# Patient Record
Sex: Male | Born: 1935 | Race: Black or African American | Hispanic: No | Marital: Single | Smoking: Former smoker
Health system: Southern US, Community
[De-identification: ages and names within clinical notes are randomized; demographics above are authoritative.]

## PROBLEM LIST (undated history)

## (undated) DIAGNOSIS — F0280 Dementia in other diseases classified elsewhere without behavioral disturbance: Secondary | ICD-10-CM

## (undated) DIAGNOSIS — H3530 Unspecified macular degeneration: Secondary | ICD-10-CM

## (undated) DIAGNOSIS — J690 Pneumonitis due to inhalation of food and vomit: Secondary | ICD-10-CM

## (undated) DIAGNOSIS — N3281 Overactive bladder: Secondary | ICD-10-CM

## (undated) DIAGNOSIS — J189 Pneumonia, unspecified organism: Secondary | ICD-10-CM

## (undated) DIAGNOSIS — B965 Pseudomonas (aeruginosa) (mallei) (pseudomallei) as the cause of diseases classified elsewhere: Secondary | ICD-10-CM

## (undated) DIAGNOSIS — M810 Age-related osteoporosis without current pathological fracture: Secondary | ICD-10-CM

## (undated) DIAGNOSIS — R55 Syncope and collapse: Secondary | ICD-10-CM

## (undated) DIAGNOSIS — N390 Urinary tract infection, site not specified: Secondary | ICD-10-CM

## (undated) DIAGNOSIS — F32A Depression, unspecified: Secondary | ICD-10-CM

## (undated) DIAGNOSIS — K589 Irritable bowel syndrome without diarrhea: Secondary | ICD-10-CM

## (undated) DIAGNOSIS — G20A1 Parkinson's disease without dyskinesia, without mention of fluctuations: Secondary | ICD-10-CM

## (undated) DIAGNOSIS — K122 Cellulitis and abscess of mouth: Secondary | ICD-10-CM

## (undated) DIAGNOSIS — G231 Progressive supranuclear ophthalmoplegia [Steele-Richardson-Olszewski]: Secondary | ICD-10-CM

## (undated) DIAGNOSIS — D649 Anemia, unspecified: Secondary | ICD-10-CM

## (undated) DIAGNOSIS — G20 Parkinson's disease: Secondary | ICD-10-CM

## (undated) DIAGNOSIS — I639 Cerebral infarction, unspecified: Secondary | ICD-10-CM

## (undated) DIAGNOSIS — G20C Parkinsonism, unspecified: Secondary | ICD-10-CM

## (undated) DIAGNOSIS — K5790 Diverticulosis of intestine, part unspecified, without perforation or abscess without bleeding: Secondary | ICD-10-CM

## (undated) DIAGNOSIS — E039 Hypothyroidism, unspecified: Secondary | ICD-10-CM

## (undated) DIAGNOSIS — G309 Alzheimer's disease, unspecified: Secondary | ICD-10-CM

## (undated) DIAGNOSIS — I10 Essential (primary) hypertension: Secondary | ICD-10-CM

## (undated) DIAGNOSIS — R32 Unspecified urinary incontinence: Secondary | ICD-10-CM

## (undated) DIAGNOSIS — M1990 Unspecified osteoarthritis, unspecified site: Secondary | ICD-10-CM

## (undated) DIAGNOSIS — M109 Gout, unspecified: Secondary | ICD-10-CM

## (undated) DIAGNOSIS — F329 Major depressive disorder, single episode, unspecified: Secondary | ICD-10-CM

## (undated) DIAGNOSIS — G459 Transient cerebral ischemic attack, unspecified: Secondary | ICD-10-CM

## (undated) DIAGNOSIS — IMO0002 Reserved for concepts with insufficient information to code with codable children: Secondary | ICD-10-CM

## (undated) HISTORY — DX: Pneumonia, unspecified organism: J18.9

## (undated) HISTORY — PX: ESOPHAGOGASTRODUODENOSCOPY: SHX1529

## (undated) HISTORY — DX: Urinary tract infection, site not specified: N39.0

## (undated) HISTORY — DX: Major depressive disorder, single episode, unspecified: F32.9

## (undated) HISTORY — DX: Unspecified osteoarthritis, unspecified site: M19.90

## (undated) HISTORY — DX: Gout, unspecified: M10.9

## (undated) HISTORY — DX: Pneumonitis due to inhalation of food and vomit: J69.0

## (undated) HISTORY — DX: Transient cerebral ischemic attack, unspecified: G45.9

## (undated) HISTORY — DX: Age-related osteoporosis without current pathological fracture: M81.0

## (undated) HISTORY — DX: Anemia, unspecified: D64.9

## (undated) HISTORY — DX: Irritable bowel syndrome, unspecified: K58.9

## (undated) HISTORY — DX: Overactive bladder: N32.81

## (undated) HISTORY — DX: Hypothyroidism, unspecified: E03.9

## (undated) HISTORY — DX: Unspecified urinary incontinence: R32

## (undated) HISTORY — DX: Essential (primary) hypertension: I10

## (undated) HISTORY — DX: Parkinson's disease: G20

## (undated) HISTORY — DX: Unspecified macular degeneration: H35.30

## (undated) HISTORY — DX: Dementia in other diseases classified elsewhere, unspecified severity, without behavioral disturbance, psychotic disturbance, mood disturbance, and anxiety: F02.80

## (undated) HISTORY — DX: Pseudomonas (aeruginosa) (mallei) (pseudomallei) as the cause of diseases classified elsewhere: B96.5

## (undated) HISTORY — DX: Reserved for concepts with insufficient information to code with codable children: IMO0002

## (undated) HISTORY — DX: Depression, unspecified: F32.A

## (undated) HISTORY — DX: Diverticulosis of intestine, part unspecified, without perforation or abscess without bleeding: K57.90

## (undated) HISTORY — DX: Parkinson's disease without dyskinesia, without mention of fluctuations: G20.A1

## (undated) HISTORY — DX: Alzheimer's disease, unspecified: G30.9

## (undated) HISTORY — DX: Cellulitis and abscess of mouth: K12.2

---

## 1998-07-13 ENCOUNTER — Encounter: Payer: Self-pay | Admitting: Cardiology

## 1998-07-13 ENCOUNTER — Ambulatory Visit (HOSPITAL_COMMUNITY): Admission: RE | Admit: 1998-07-13 | Discharge: 1998-07-13 | Payer: Self-pay | Admitting: Cardiology

## 2004-07-19 ENCOUNTER — Encounter: Admission: RE | Admit: 2004-07-19 | Discharge: 2004-07-19 | Payer: Self-pay | Admitting: Gastroenterology

## 2004-11-17 HISTORY — PX: EYE SURGERY: SHX253

## 2004-12-06 ENCOUNTER — Emergency Department (HOSPITAL_COMMUNITY): Admission: EM | Admit: 2004-12-06 | Discharge: 2004-12-06 | Payer: Self-pay | Admitting: Family Medicine

## 2005-06-17 ENCOUNTER — Encounter: Admission: RE | Admit: 2005-06-17 | Discharge: 2005-06-17 | Payer: Self-pay | Admitting: Family Medicine

## 2005-10-06 ENCOUNTER — Encounter: Admission: RE | Admit: 2005-10-06 | Discharge: 2005-10-06 | Payer: Self-pay | Admitting: Family Medicine

## 2006-03-18 ENCOUNTER — Encounter: Payer: Self-pay | Admitting: Cardiology

## 2006-05-07 ENCOUNTER — Ambulatory Visit: Payer: Self-pay | Admitting: Family Medicine

## 2006-07-07 ENCOUNTER — Ambulatory Visit: Payer: Self-pay | Admitting: Family Medicine

## 2006-07-10 ENCOUNTER — Encounter: Payer: Self-pay | Admitting: Nurse Practitioner

## 2006-07-10 ENCOUNTER — Ambulatory Visit: Payer: Self-pay | Admitting: Family Medicine

## 2006-07-15 ENCOUNTER — Ambulatory Visit: Payer: Self-pay | Admitting: Gastroenterology

## 2006-10-05 ENCOUNTER — Emergency Department (HOSPITAL_COMMUNITY): Admission: EM | Admit: 2006-10-05 | Discharge: 2006-10-05 | Payer: Self-pay | Admitting: Family Medicine

## 2007-09-16 ENCOUNTER — Ambulatory Visit: Payer: Self-pay | Admitting: Family Medicine

## 2007-10-04 ENCOUNTER — Encounter: Admission: RE | Admit: 2007-10-04 | Discharge: 2007-10-04 | Payer: Self-pay | Admitting: Family Medicine

## 2007-10-04 ENCOUNTER — Ambulatory Visit: Payer: Self-pay | Admitting: Family Medicine

## 2007-10-11 ENCOUNTER — Ambulatory Visit: Payer: Self-pay | Admitting: Family Medicine

## 2008-01-20 ENCOUNTER — Ambulatory Visit: Payer: Self-pay | Admitting: Family Medicine

## 2008-01-21 ENCOUNTER — Ambulatory Visit: Payer: Self-pay | Admitting: Family Medicine

## 2008-03-02 ENCOUNTER — Ambulatory Visit: Payer: Self-pay | Admitting: Family Medicine

## 2008-03-07 ENCOUNTER — Ambulatory Visit: Payer: Self-pay | Admitting: Family Medicine

## 2008-05-03 LAB — HM DEXA SCAN

## 2008-05-31 ENCOUNTER — Ambulatory Visit: Payer: Self-pay | Admitting: Family Medicine

## 2008-06-08 ENCOUNTER — Ambulatory Visit: Payer: Self-pay | Admitting: Family Medicine

## 2008-08-03 ENCOUNTER — Ambulatory Visit: Payer: Self-pay | Admitting: Family Medicine

## 2008-12-08 ENCOUNTER — Ambulatory Visit: Payer: Self-pay | Admitting: Family Medicine

## 2009-01-11 ENCOUNTER — Ambulatory Visit: Payer: Self-pay | Admitting: Family Medicine

## 2009-01-16 ENCOUNTER — Inpatient Hospital Stay (HOSPITAL_COMMUNITY): Admission: EM | Admit: 2009-01-16 | Discharge: 2009-01-24 | Payer: Self-pay | Admitting: Emergency Medicine

## 2009-01-16 ENCOUNTER — Ambulatory Visit: Payer: Self-pay | Admitting: Internal Medicine

## 2009-01-17 ENCOUNTER — Encounter (INDEPENDENT_AMBULATORY_CARE_PROVIDER_SITE_OTHER): Payer: Self-pay | Admitting: *Deleted

## 2009-03-12 ENCOUNTER — Ambulatory Visit: Payer: Self-pay | Admitting: Pulmonary Disease

## 2009-03-12 ENCOUNTER — Inpatient Hospital Stay (HOSPITAL_COMMUNITY): Admission: EM | Admit: 2009-03-12 | Discharge: 2009-04-02 | Payer: Self-pay | Admitting: Emergency Medicine

## 2009-03-19 ENCOUNTER — Ambulatory Visit: Payer: Self-pay | Admitting: Infectious Diseases

## 2009-03-19 ENCOUNTER — Encounter (INDEPENDENT_AMBULATORY_CARE_PROVIDER_SITE_OTHER): Payer: Self-pay | Admitting: Internal Medicine

## 2009-03-21 ENCOUNTER — Encounter (INDEPENDENT_AMBULATORY_CARE_PROVIDER_SITE_OTHER): Payer: Self-pay | Admitting: Internal Medicine

## 2009-03-21 ENCOUNTER — Ambulatory Visit: Payer: Self-pay | Admitting: Vascular Surgery

## 2009-04-17 ENCOUNTER — Encounter: Admission: RE | Admit: 2009-04-17 | Discharge: 2009-04-17 | Payer: Self-pay | Admitting: Geriatric Medicine

## 2009-05-31 ENCOUNTER — Ambulatory Visit: Payer: Self-pay | Admitting: Family Medicine

## 2009-06-01 ENCOUNTER — Telehealth (INDEPENDENT_AMBULATORY_CARE_PROVIDER_SITE_OTHER): Payer: Self-pay | Admitting: *Deleted

## 2009-06-14 ENCOUNTER — Ambulatory Visit: Payer: Self-pay | Admitting: Family Medicine

## 2009-07-12 ENCOUNTER — Ambulatory Visit: Payer: Self-pay | Admitting: Family Medicine

## 2009-08-20 ENCOUNTER — Ambulatory Visit: Payer: Self-pay | Admitting: Family Medicine

## 2009-09-06 ENCOUNTER — Ambulatory Visit: Payer: Self-pay | Admitting: Vascular Surgery

## 2009-09-06 ENCOUNTER — Inpatient Hospital Stay (HOSPITAL_COMMUNITY): Admission: EM | Admit: 2009-09-06 | Discharge: 2009-09-14 | Payer: Self-pay | Admitting: Emergency Medicine

## 2009-09-06 ENCOUNTER — Ambulatory Visit: Payer: Self-pay | Admitting: Internal Medicine

## 2009-09-07 ENCOUNTER — Encounter (INDEPENDENT_AMBULATORY_CARE_PROVIDER_SITE_OTHER): Payer: Self-pay | Admitting: Family Medicine

## 2009-09-24 DIAGNOSIS — F0280 Dementia in other diseases classified elsewhere without behavioral disturbance: Secondary | ICD-10-CM

## 2009-09-24 DIAGNOSIS — R1310 Dysphagia, unspecified: Secondary | ICD-10-CM | POA: Insufficient documentation

## 2009-09-24 DIAGNOSIS — E039 Hypothyroidism, unspecified: Secondary | ICD-10-CM | POA: Insufficient documentation

## 2009-09-24 DIAGNOSIS — Z8669 Personal history of other diseases of the nervous system and sense organs: Secondary | ICD-10-CM

## 2009-09-24 DIAGNOSIS — Z86718 Personal history of other venous thrombosis and embolism: Secondary | ICD-10-CM

## 2009-09-24 DIAGNOSIS — M109 Gout, unspecified: Secondary | ICD-10-CM

## 2009-09-24 DIAGNOSIS — K3184 Gastroparesis: Secondary | ICD-10-CM

## 2009-09-24 DIAGNOSIS — G309 Alzheimer's disease, unspecified: Secondary | ICD-10-CM

## 2009-09-24 DIAGNOSIS — I498 Other specified cardiac arrhythmias: Secondary | ICD-10-CM | POA: Insufficient documentation

## 2009-10-16 ENCOUNTER — Ambulatory Visit: Payer: Self-pay | Admitting: Family Medicine

## 2009-10-19 ENCOUNTER — Ambulatory Visit: Payer: Self-pay | Admitting: Family Medicine

## 2009-11-13 ENCOUNTER — Ambulatory Visit: Payer: Self-pay | Admitting: Family Medicine

## 2009-11-21 ENCOUNTER — Ambulatory Visit: Payer: Self-pay | Admitting: Family Medicine

## 2009-12-17 ENCOUNTER — Inpatient Hospital Stay (HOSPITAL_COMMUNITY): Admission: EM | Admit: 2009-12-17 | Discharge: 2009-12-20 | Payer: Self-pay | Admitting: Emergency Medicine

## 2009-12-17 ENCOUNTER — Ambulatory Visit: Payer: Self-pay | Admitting: Internal Medicine

## 2009-12-23 ENCOUNTER — Ambulatory Visit: Payer: Self-pay | Admitting: Vascular Surgery

## 2009-12-23 ENCOUNTER — Emergency Department (HOSPITAL_COMMUNITY): Admission: EM | Admit: 2009-12-23 | Discharge: 2009-12-23 | Payer: Self-pay | Admitting: Family Medicine

## 2009-12-23 ENCOUNTER — Emergency Department (HOSPITAL_COMMUNITY): Admission: EM | Admit: 2009-12-23 | Discharge: 2009-12-23 | Payer: Self-pay | Admitting: Emergency Medicine

## 2009-12-25 ENCOUNTER — Ambulatory Visit: Payer: Self-pay | Admitting: Cardiovascular Disease

## 2010-01-01 ENCOUNTER — Ambulatory Visit: Payer: Self-pay | Admitting: Family Medicine

## 2010-01-29 ENCOUNTER — Ambulatory Visit: Payer: Self-pay | Admitting: Family Medicine

## 2010-03-05 ENCOUNTER — Ambulatory Visit: Payer: Self-pay | Admitting: Family Medicine

## 2010-06-17 ENCOUNTER — Ambulatory Visit: Payer: Self-pay | Admitting: Internal Medicine

## 2010-06-26 ENCOUNTER — Ambulatory Visit: Payer: Self-pay | Admitting: Family Medicine

## 2010-06-27 ENCOUNTER — Ambulatory Visit: Payer: Self-pay | Admitting: Family Medicine

## 2010-07-24 ENCOUNTER — Ambulatory Visit: Payer: Self-pay | Admitting: Family Medicine

## 2010-08-01 ENCOUNTER — Encounter: Payer: Self-pay | Admitting: Internal Medicine

## 2010-09-06 ENCOUNTER — Telehealth: Payer: Self-pay | Admitting: Internal Medicine

## 2010-09-06 ENCOUNTER — Emergency Department (HOSPITAL_COMMUNITY): Admission: EM | Admit: 2010-09-06 | Discharge: 2010-09-06 | Payer: Self-pay | Admitting: Emergency Medicine

## 2010-10-21 ENCOUNTER — Ambulatory Visit: Payer: Self-pay | Admitting: Family Medicine

## 2010-10-22 ENCOUNTER — Telehealth: Payer: Self-pay | Admitting: Internal Medicine

## 2010-10-22 ENCOUNTER — Emergency Department (HOSPITAL_COMMUNITY)
Admission: EM | Admit: 2010-10-22 | Discharge: 2010-10-22 | Payer: Self-pay | Source: Home / Self Care | Admitting: Emergency Medicine

## 2010-10-24 ENCOUNTER — Ambulatory Visit: Payer: Self-pay | Admitting: Internal Medicine

## 2010-10-24 ENCOUNTER — Encounter: Payer: Self-pay | Admitting: Internal Medicine

## 2010-10-28 ENCOUNTER — Telehealth: Payer: Self-pay | Admitting: Internal Medicine

## 2010-11-25 ENCOUNTER — Ambulatory Visit: Admit: 2010-11-25 | Payer: Self-pay | Admitting: Internal Medicine

## 2010-11-26 ENCOUNTER — Encounter (INDEPENDENT_AMBULATORY_CARE_PROVIDER_SITE_OTHER): Payer: Self-pay | Admitting: *Deleted

## 2010-11-28 ENCOUNTER — Telehealth: Payer: Self-pay | Admitting: Internal Medicine

## 2010-11-28 ENCOUNTER — Ambulatory Visit
Admission: RE | Admit: 2010-11-28 | Discharge: 2010-11-28 | Payer: Self-pay | Source: Home / Self Care | Attending: Family Medicine | Admitting: Family Medicine

## 2010-12-17 NOTE — Progress Notes (Signed)
Summary: pt on the way to [facility] 4th syncope  Phone Note Call from Patient Call back at [phone]   Caller: Son/REGINALD Reason for Call: Talk to Nurse Summary of Call: pt son reginald states father is on his way to [facility]. pt son reginald states father had his fourth syncope. Initial call taken by: Roe Coombs,  September 06, 2010 10:12 AM  Follow-up for Phone Call        I spoke with Tiburcio Bash about his fatherwho this morning became unconscious while sitting in the chair.  He estimates he was unconscious for about 10 minutes.  According to son, patient was breathing normally, drooling, and did not experience any tremors. He was not able to awaken him until 911 instructed him to place his father in the prone position on the floor and turn head to side.  Pt then began to regain consciousness according to son. Son just called back to say pts heartrate was 48.  Trish aware pt on the way to ED.  I will send to Dr. Waldon Merl RN

## 2010-12-17 NOTE — Letter (Signed)
Summary: [facility] Office Visit Note   [facility] Office Visit Note   Imported By: Roderic Ovens 08/16/2010 11:49:25  _____________________________________________________________________  External Attachment:    Type:   Image     Comment:   External Document

## 2010-12-17 NOTE — Progress Notes (Signed)
Summary: pt's son calling re pt in er  Phone Note Call from Patient   Caller: Ricky [name] pt's son [phone] Reason for Call: Talk to Nurse Summary of Call: re pt at [facility] long er-pls call  Initial call taken by: Glynda Jaeger,  October 22, 2010 4:01 PM  Follow-up for Phone Call        son called to say that in the ER the pt's heart rate seems to be in consistently in the 40's-son questioned if this was the cause of pt's syncope--per son--pt still in ER and has had lots of testing -pt on his way for CT scan-he wanted Dr Tenny Craw to be aware of this-I will forward to Dr Tenny Craw for review     Appended Document: pt's son calling re pt in er Called patient's son and advised him to bring Ricky [name] in to see Dr.Ross tomorrow 12/8  at 930 am.   Appended Document: pt's son calling re pt in er Patient seen in clnic

## 2010-12-17 NOTE — Assessment & Plan Note (Signed)
Summary: eph/jss   History of Present Illness: Ricky [name] is a patient of Dr Tenny Craw recenlty seen at [facility].  He has had a progressive neurologic decline over the last year.  He has been somewhat bradycardic.  In January during his hospitalization he had his aricept stopped.  He has had no evidence of heart block or symptomatic bradycardia.  He has had 3 "syncopal" episodes over the last 6 months.  They have prodromes of staring with stiffness and Upper extremity shaking.  He has been evaluated by neurology but not placed on any meds.  I suggested to his son that he seek a second opinion since he has had such a percipitus change in functional status with presumed dementia.  From a cardiac standpoint there have been no documented arrythmia, heart block.  His echo shows normal LV funciton.  He is no longer on any AV nodal blocking drugs.  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 Two Times A Day 2)  Hydralazine Hcl 10 Mg Tabs (Hydralazine Hcl) .... Take One Tablet By Mouth Three Times A Day 3)  Avelox 400 Mg Tabs (Moxifloxacin Hcl) .Marland Kitchen.. 1 Tab By Mouth Once Daily 4)  Alendronate Sodium 70 Mg Tabs (Alendronate Sodium) .Marland Kitchen.. 1 Tab Weekly 5)  Lisinopril 20 Mg Tabs (Lisinopril) .... Take One Tablet By Mouth Daily 6)  Namenda 10 Mg Tabs (Memantine Hcl) .Marland Kitchen.. 1 Tab By Mouth Once Daily 7)  Omeprazole 20 Mg Cpdr (Omeprazole) .Marland Kitchen.. 1 Tab By Mouth Once Daily 8)  Synthroid 25 Mcg Tabs (Levothyroxine Sodium) .Marland Kitchen.. 1 Tab By  Mouth Once Daily 9)  Warfarin Sodium 7.5 Mg Tabs (Warfarin Sodium) .... As Directed 10)  Warfarin Sodium 2.5 Mg Tabs (Warfarin Sodium) .... As Direcetd  Allergies (verified): No Known Drug Allergies  Past History:  Past Medical History: Last updated: 09/24/2009 Current Problems:  HYPERTENSION, UNSPECIFIED (ICD-401.9) SYNCOPE, HX OF (ICD-V12.49) BRADYCARDIA (ICD-427.89) ALZHEIMERS DISEASE (ICD-331.0) ACCIDENTAL FALLS, RECURRENT (ICD-E888.9) GASTROPARESIS (ICD-536.3) GOUT, UNSPECIFIED (ICD-274.9) HYPOTHYROIDISM (ICD-244.9) PULMONARY EMBOLISM, HX OF (ICD-V12.51) DYSPHAGIA UNSPECIFIED (ICD-787.20)  Past Surgical History: Last updated: 09/24/2009 None  Family History: Last updated: 09/24/2009  There is no premature coronary disease in the family.   Social History: Last updated: 09/24/2009  He lives in the [facility].  He denies   current use of tobacco, alcohol or drugs.      Review of Systems       Denies fever, malais, weight loss, blurry vision, decreased visual acuity, cough, sputum, SOB, hemoptysis, pleuritic pain, palpitaitons, heartburn, abdominal pain, melena, lower extremity edema, claudication, or rash. All other systems reveiwed and negative  Vital Signs:  Patient profile:   77 year old male Height:      73 inches Weight:      162 pounds Pulse rate:   52 / minute Resp:     14 per minute BP sitting:   140 / 70  (left arm)  Vitals Entered By: Kem Parkinson (December 25, 2009 10:49 AM)  Physical Exam  General:  Affect appropriate Healthy:  appears stated age HEENT: normal Neck supple with no adenopathy JVP normal no bruits no thyromegaly Lungs clear with no wheezing and good diaphragmatic motion Heart:  S1/S2 no murmur,rub, gallop or click PMI normal Abdomen: benighn, BS positve, no tenderness, no AAA no bruit.  No HSM or HJR Distal pulses intact with no bruits No edema Neuro non-focal Skin warm and dry Stiff in extremities  with slow oral response   Impression & Recommendations:  Problem # 1:  HYPERTENSION, UNSPECIFIED (ICD-401.9) Well cotnrolled His updated medication list for this problem includes:    Hydralazine Hcl 10 Mg Tabs (Hydralazine hcl) .Marland Kitchen... Take one tablet by mouth three times a day    Lisinopril 20 Mg Tabs (Lisinopril) .Marland Kitchen... Take one tablet by mouth daily  Problem # 2:  BRADYCARDIA (ICD-427.89) STable with no heart block His updated medication list for this problem includes:    Lovenox 80 Mg/0.27ml Soln (Enoxaparin sodium) .Marland Kitchen..Marland Kitchen Two times a day    Lisinopril 20 Mg Tabs (Lisinopril) .Marland Kitchen... Take one tablet by mouth daily    Warfarin Sodium 7.5 Mg Tabs (Warfarin sodium) .Marland Kitchen... As directed    Warfarin Sodium 2.5 Mg Tabs (Warfarin sodium) .Marland Kitchen... As direcetd  Problem # 5:  SYNCOPE, HX OF (ICD-V12.49) ? seizurs or other undiagnosed neurologic issue aside from just dementia.  Recommend 2nd opionion from neurology at [facility] or [facility]  Problem # 6:  PULMONARY EMBOLISM, HX OF (ICD-V12.51) Residual clot seen on CT 1/11.  Given age and neuro status would consider placing an IVC filter and stopping coumadin after 3 months. or sooner as he is at risk of recurrent falls and bleed. His updated medication list for this problem includes:    Lovenox 80 Mg/0.79ml Soln (Enoxaparin sodium) .Marland Kitchen..Marland Kitchen Two times a day    Warfarin Sodium 7.5 Mg Tabs (Warfarin sodium) .Marland Kitchen... As directed    Warfarin Sodium 2.5 Mg Tabs (Warfarin sodium) .Marland Kitchen... As direcetd  Patient Instructions: 1)  Your physician recommends that you schedule a follow-up appointment in: 6 MONTHS

## 2010-12-17 NOTE — Progress Notes (Signed)
Summary: syncope   Phone Note Call from Patient Call back at [phone]   Caller: Son Reason for Call: Talk to Nurse Summary of Call: father had another syncopy on his way to [facility]. Initial call taken by: Roe Coombs,  October 22, 2010 12:08 PM  Follow-up for Phone Call        I called and spoke with the pt's son. He states his dad had his 5th syncopal episode about 30 minutes ago. He had gotten up and was getting ready. He was in the bathroom and sat down on the toilet. He became unresponsive for about 6 minutes, but did not hit his head. The pt's son reports his HR was slow, but he did not have any shaking or tremors like his first 3 episodes. The pt is just arriving at the [facility] ER via EMS while I am speaking with him. I instructed the pt's son I would let our group know he was at the ER, but the ER MD should evaluate him. He is agreeable. Follow-up by: Sherri Rad, RN, BSN,  October 22, 2010 12:13 PM  Additional Follow-up for Phone Call Additional follow up Details #1::        I spoke with Trish and made her aware the pt is in the ER at [facility].  Additional Follow-up by: Sherri Rad, RN, BSN,  October 22, 2010 1:05 PM

## 2010-12-17 NOTE — Assessment & Plan Note (Signed)
Summary: F6M/DM   Visit Type:  Follow-up Primary Provider:  Dr Debbe Mounts  CC:  no complaints- pt is being taken care of by his son.  History of Present Illness: Patient is a  77 year old who was  discharged from [facility] earlier this year.  He was admitted for syncope.  He was found to be bradycardic Aricept was discontinued.  He was not felt to be a candidate for pacemaker. Since d/c he has not  had any further syncopal spells.  He denies chest pains.  No signifiant dizziness His son is with him today.   Concern is the  rapid change in the patient's mental statu, slowing down.  It has not been fully evaluated and family would like to have it evaluated.  Current Medications (verified): 1)  Hydralazine Hcl 10 Mg Tabs (Hydralazine Hcl) .... Take One Tablet By Mouth Three Times A Day 2)  Avelox 400 Mg Tabs (Moxifloxacin Hcl) .Marland Kitchen.. 1 Tab By Mouth Once Daily 3)  Alendronate Sodium 70 Mg Tabs (Alendronate Sodium) .Marland Kitchen.. 1 Tab Weekly 4)  Lisinopril 20 Mg Tabs (Lisinopril) .... Take One Tablet By Mouth Daily 5)  Namenda 10 Mg Tabs (Memantine Hcl) .Marland Kitchen.. 1 Tab By Mouth Once Daily 6)  Omeprazole 20 Mg Cpdr (Omeprazole) .Marland Kitchen.. 1 Tab By Mouth Once Daily 7)  Synthroid 25 Mcg Tabs (Levothyroxine Sodium) .Marland Kitchen.. 1 Tab By Mouth Once Daily 8)  Warfarin Sodium 7.5 Mg Tabs (Warfarin Sodium) .... As Directed 9)  Warfarin Sodium 2.5 Mg Tabs (Warfarin Sodium) .... As Direcetd  Allergies (verified): No Known Drug Allergies  Past History:  Past medical, surgical, family and social histories (including risk factors) reviewed, and no changes noted (except as noted below).  Past Medical History: Reviewed history from 09/24/2009 and no changes required. Current Problems:  HYPERTENSION, UNSPECIFIED (ICD-401.9) SYNCOPE, HX OF (ICD-V12.49) BRADYCARDIA (ICD-427.89) ALZHEIMERS DISEASE (ICD-331.0) ACCIDENTAL FALLS, RECURRENT (ICD-E888.9) GASTROPARESIS (ICD-536.3) GOUT, UNSPECIFIED (ICD-274.9) HYPOTHYROIDISM  (ICD-244.9) PULMONARY EMBOLISM, HX OF (ICD-V12.51) DYSPHAGIA UNSPECIFIED (ICD-787.20)  Past Surgical History: Reviewed history from 09/24/2009 and no changes required. None  Family History: Reviewed history from 09/24/2009 and no changes required.  There is no premature coronary disease in the family.   Social History: Reviewed history from 09/24/2009 and no changes required.  He lives in the [facility].  He denies   current use of tobacco, alcohol or drugs.      Review of Systems       All systems reviewed.  Negative to the above prblem.  Vital Signs:  Patient profile:   77 year old male Height:      73 inches Weight:      179 pounds BMI:     23.70 Pulse rate:   60 / minute BP sitting:   150 / 80  (left arm) Cuff size:   large  Vitals Entered By: Burnett Kanaris, CNA (June 17, 2010 2:02 PM)  Physical Exam  Additional Exam:  Patient is in NAD HEENT:  Normocephalic, atraumatic. EOMI, PERRLA.  Neck: JVP is normal. No thyromegaly. No bruits.  Lungs: clear to auscultation. No rales no wheezes.  Heart: Regular rate and rhythm. Normal S1, S2. No S3.   No significant murmurs. PMI not displaced.  Abdomen:  Supple, nontender. Normal bowel sounds. No masses. No hepatomegaly.  Extremities:   Good distal pulses throughout. No lower extremity edema.  Musculoskeletal :moving all extremities.  Neuro:   alert and oriented x3.    Impression & Recommendations:  Problem # 1:  SYNCOPE, HX OF (ICD-V12.49) No recurrence.  Follow.  No evidence for pacer indication. Will refer to [facility] neuro for evaluation of possible dementia (reported recent onset)  Problem # 2:  HYPERTENSION, UNSPECIFIED (ICD-401.9) Al little high.  LAst visit was 140/70  Follow. His updated medication list for this problem includes:    Hydralazine Hcl 10 Mg Tabs (Hydralazine hcl) .Marland Kitchen... Take one tablet by mouth three times a day    Lisinopril 20 Mg Tabs (Lisinopril) .Marland Kitchen... Take one tablet by  mouth daily  Other Orders: Neurology Referral (Neuro)  Patient Instructions: 1)  Your physician wants you to follow-up in:6 months   You will receive a reminder letter in the mail two months in advance. If you don't receive a letter, please call our office to schedule the follow-up appointment. 2)  You have been referred to [facility] Neuro....will call you with appointment with Dr,Love.

## 2010-12-17 NOTE — Assessment & Plan Note (Signed)
Summary: ? syncope   Primary Provider:  Dr Debbe Mounts  CC:  C/O syncope.  History of Present Illness: patient is a 77 year old with a history of syncope and bradycardia..   I saw him in August.  He was admitted in the spring.  Aricept was stopped because of bradycardia.  No signifi  bradycardia was noted after Since I saw him he has had a couple of syncopal spells.  One occurred while he was watching TV.  lasted a few minutes.  No prodorome. The other spell happened this week.  He was in bathroom  He had finished getting dressed.  No dizziness.  Just sitting on toilet, not going to the bathroom.  Became unresponsive, eyes rolling back in head.  Again, no dizziness pror or after.  Seen in ER.  D/C'd.   SInce then is feeling fine.  Current Medications (verified): 1)  Hydralazine Hcl 10 Mg Tabs (Hydralazine Hcl) .... Take One Tablet By Mouth Three Times A Day 2)  Avelox 400 Mg Tabs (Moxifloxacin Hcl) .Marland Kitchen.. 1 Tab By Mouth Once Daily 3)  Alendronate Sodium 70 Mg Tabs (Alendronate Sodium) .Marland Kitchen.. 1 Tab Weekly 4)  Lisinopril 20 Mg Tabs (Lisinopril) .... Take One Tablet By Mouth Daily 5)  Namenda 10 Mg Tabs (Memantine Hcl) .Marland Kitchen.. 1 Tab By Mouth Once Daily 6)  Omeprazole 20 Mg Cpdr (Omeprazole) .Marland Kitchen.. 1 Tab By Mouth Once Daily 7)  Synthroid 25 Mcg Tabs (Levothyroxine Sodium) .Marland Kitchen.. 1 Tab By Mouth Once Daily 8)  Warfarin Sodium 7.5 Mg Tabs (Warfarin Sodium) .... As Directed 9)  Warfarin Sodium 2.5 Mg Tabs (Warfarin Sodium) .... As Direcetd  Allergies: No Known Drug Allergies  Past History:  Past medical, surgical, family and social histories (including risk factors) reviewed, and no changes noted (except as noted below).  Past Medical History: Reviewed history from 09/24/2009 and no changes required. Current Problems:  HYPERTENSION, UNSPECIFIED (ICD-401.9) SYNCOPE, HX OF (ICD-V12.49) BRADYCARDIA (ICD-427.89) ALZHEIMERS DISEASE (ICD-331.0) ACCIDENTAL FALLS, RECURRENT (ICD-E888.9) GASTROPARESIS  (ICD-536.3) GOUT, UNSPECIFIED (ICD-274.9) HYPOTHYROIDISM (ICD-244.9) PULMONARY EMBOLISM, HX OF (ICD-V12.51) DYSPHAGIA UNSPECIFIED (ICD-787.20)  Past Surgical History: Reviewed history from 09/24/2009 and no changes required. None  Family History: Reviewed history from 09/24/2009 and no changes required.  There is no premature coronary disease in the family.   Social History: Reviewed history from 09/24/2009 and no changes required.  He lives in the [facility].  He denies   current use of tobacco, alcohol or drugs.      Vital Signs:  Patient profile:   77 year old male Height:      73 inches Weight:      177 pounds BMI:     23.44 Pulse rate:   63 / minute BP supine:   142 / 74  (right arm) BP sitting:   136 / 73  (right arm) BP standing:   122 / 69  (right arm)  Physical Exam  Additional Exam:  Patient is in NAD HEENT:  Normocephalic, atraumatic. EOMI, PERRLA.  Neck: JVP is normal. No thyromegaly. No bruits.  Lungs: clear to auscultation. No rales no wheezes.  Heart: Regular rate and rhythm. Normal S1, S2. No S3.   No significant murmurs. PMI not displaced.  Abdomen:  Supple, nontender. Normal bowel sounds. No masses. No hepatomegaly.  Extremities:   Good distal pulses throughout. No lower extremity edema.  Musculoskeletal :moving all extremities.  Neuro:   alert and oriented x3.    EKG  Procedure date:  10/24/2010  Findings:      NSR.  63 bpm.  LVH.  Nonspecific ST T wave changes.  Impression & Recommendations:  Problem # 1:  SYNCOPE, HX OF (ICD-V12.49) Patient with two more spells.  NO prodrome. Will arrange for lifewatch monitor.  Continue activities as tolerated. Orders: Event (Event)  Problem # 2:  BRADYCARDIA (ICD-427.89) Monitor. His updated medication list for this problem includes:    Lisinopril 20 Mg Tabs (Lisinopril) .Marland Kitchen... Take one tablet by mouth daily    Warfarin Sodium 7.5 Mg Tabs (Warfarin sodium) .Marland Kitchen... As directed     Warfarin Sodium 2.5 Mg Tabs (Warfarin sodium) .Marland Kitchen... As direcetd  Problem # 3:  PULMONARY EMBOLISM, HX OF (ICD-V12.51) Continue coumadin.  CT does not show recurrence. His updated medication list for this problem includes:    Warfarin Sodium 7.5 Mg Tabs (Warfarin sodium) .Marland Kitchen... As directed    Warfarin Sodium 2.5 Mg Tabs (Warfarin sodium) .Marland Kitchen... As direcetd  Patient Instructions: 1)  Remote monitoring is used to monitor your Pacemaker or ICD from home. This monitoring reduces the number of office visits required to check your device to one time per year.  It allows [place] to keep an eye on the functioning of your device to ensure it is working properly. You are scheduled for a device check from home on     . You may send your transmission at any time that day. If you have a wireless device, the transmission will be sent automatically. After your physician reviews your transmission, you will receive a postcard with your next transmission date. 2)  Your physician recommends that you schedule a follow-up appointment in: we will phone you with results

## 2010-12-19 NOTE — Progress Notes (Signed)
Summary: pt's son has info   Phone Note Call from Patient   Caller: son [phone] reggie Reason for Call: Talk to Nurse Summary of Call: pt's son wants to provide some info he forgot to mention last visit Initial call taken by: Glynda Jaeger,  October 28, 2010 8:49 AM  Follow-up for Phone Call        10/28/10--9am--pt's son calling to inform dr Tenny Craw that when he wentto see dr Susann Givens last week they were unable to get enough blood for PT before vein collasped--he states his father was well hydrated at the time and is concerned about pt's fluid volume--advised i would inform dr ross--pt's son agrees Follow-up by: Ledon Snare, RN,  October 28, 2010 9:00 AM     Appended Document: pt's son has info  Make sure he gets adequate fluids during day, that he urinates thorughout day and that it is relatively dilute.  Appended Document: pt's son has info  Called patient's son with above information. He states that he feels that his father is drinking enough fluids based on his urine output in the urinal.

## 2010-12-19 NOTE — Progress Notes (Addendum)
Summary: rtn your call  Phone Note Call from Patient Call back at [phone]   Caller: Son Reason for Call: Talk to Nurse, Talk to Doctor Summary of Call: assumed that Tuesdays appt was an old appt prior to recent hospitlization Initial call taken by: Omer Jack,  November 28, 2010 5:00 PM  Follow-up for Phone Call        Monitor was negative for signif bradycardia. QUestion if he has passed out since he was in clinic  Follow-up by: Sherrill Raring, MD, [facility],  December 07, 2010 8:49 PM     Appended Document: rtn your call [facility] for [name] (pt's son) to check to see if he has had any more episodes.  Appended Document: rtn your call Called patient's son ( [name] ) and he states that his father has had no more syncopal episodes. He lives with his father now and his a home care assistant for a few hours every day. He states that there are times that he is alone but does not want to go to an assisted living. He lived in the [place] home last year and disliked it. [name] states that his sister is filing paperwork to have power of attorney. [name] will call us if he has any more syncopal episodes. Will let Dr.Mantaj Chamberlin know.

## 2010-12-19 NOTE — Letter (Signed)
Summary: Appointment - Missed  [facility], Main Office  [address]   [place], [place] [address]   Phone: [phone]  Fax: [phone]     November 26, 2010 MRN: [id number]   Ricky [name] [address] [place], [place]  [address]   Dear Ricky [name],  Our records indicate you missed your appointment on 11/25/10 with Dr. Tenny Craw .It is very important that we reach you to reschedule this appointment. We look forward to participating in your health care needs. Please contact us at the number listed above at your earliest convenience to reschedule this appointment.     Sincerely,  Artist

## 2011-01-10 ENCOUNTER — Institutional Professional Consult (permissible substitution) (INDEPENDENT_AMBULATORY_CARE_PROVIDER_SITE_OTHER): Payer: BC Managed Care – PPO | Admitting: Family Medicine

## 2011-01-10 DIAGNOSIS — Z7189 Other specified counseling: Secondary | ICD-10-CM

## 2011-01-28 ENCOUNTER — Ambulatory Visit: Payer: BC Managed Care – PPO | Admitting: Family Medicine

## 2011-01-28 LAB — PROTIME-INR: Prothrombin Time: 31.6 seconds — ABNORMAL HIGH (ref 11.6–15.2)

## 2011-01-28 LAB — POCT CARDIAC MARKERS
CKMB, poc: 1.1 ng/mL (ref 1.0–8.0)
Myoglobin, poc: 119 ng/mL (ref 12–200)
Myoglobin, poc: 80 ng/mL (ref 12–200)

## 2011-01-28 LAB — CBC
HCT: 35.5 % — ABNORMAL LOW (ref 39.0–52.0)
MCHC: 33.2 g/dL (ref 30.0–36.0)
Platelets: 151 10*3/uL (ref 150–400)
RDW: 14 % (ref 11.5–15.5)
WBC: 5.8 10*3/uL (ref 4.0–10.5)

## 2011-01-28 LAB — COMPREHENSIVE METABOLIC PANEL
ALT: 8 U/L (ref 0–53)
AST: 19 U/L (ref 0–37)
Albumin: 3.7 g/dL (ref 3.5–5.2)
Alkaline Phosphatase: 58 U/L (ref 39–117)
Calcium: 9.7 mg/dL (ref 8.4–10.5)
GFR calc Af Amer: >60 mL/min (ref >60)
Glucose, Bld: 89 mg/dL (ref 70–99)
Potassium: 4.4 mEq/L (ref 3.5–5.1)
Sodium: 141 mEq/L (ref 135–145)
Total Protein: 7.3 g/dL (ref 6.0–8.3)

## 2011-01-28 LAB — DIFFERENTIAL
Basophils Relative: 0 % (ref 0–1)
Eosinophils Absolute: 0.1 10*3/uL (ref 0.0–0.7)
Eosinophils Relative: 1 % (ref 0–5)
Lymphs Abs: 1.2 10*3/uL (ref 0.7–4.0)
Monocytes Absolute: 0.5 10*3/uL (ref 0.1–1.0)
Monocytes Relative: 8 % (ref 3–12)

## 2011-01-29 ENCOUNTER — Ambulatory Visit (INDEPENDENT_AMBULATORY_CARE_PROVIDER_SITE_OTHER): Payer: BC Managed Care – PPO | Admitting: Family Medicine

## 2011-01-29 DIAGNOSIS — S5000XA Contusion of unspecified elbow, initial encounter: Secondary | ICD-10-CM

## 2011-01-29 LAB — DIFFERENTIAL
Basophils Absolute: 0 10*3/uL (ref 0.0–0.1)
Basophils Relative: 0 % (ref 0–1)
Eosinophils Absolute: 0 10*3/uL (ref 0.0–0.7)
Neutro Abs: 3.7 10*3/uL (ref 1.7–7.7)
Neutrophils Relative %: 66 % (ref 43–77)

## 2011-01-29 LAB — URINALYSIS, ROUTINE W REFLEX MICROSCOPIC
Nitrite: NEGATIVE
Protein, ur: NEGATIVE mg/dL
Specific Gravity, Urine: 1.01 (ref 1.005–1.030)
Urobilinogen, UA: 0.2 mg/dL (ref 0.0–1.0)

## 2011-01-29 LAB — COMPREHENSIVE METABOLIC PANEL
ALT: 9 U/L (ref 0–53)
Alkaline Phosphatase: 71 U/L (ref 39–117)
BUN: 14 mg/dL (ref 6–23)
CO2: 27 mEq/L (ref 19–32)
GFR calc non Af Amer: >60 mL/min (ref >60)
Glucose, Bld: 96 mg/dL (ref 70–99)
Potassium: 3.9 mEq/L (ref 3.5–5.1)
Sodium: 141 mEq/L (ref 135–145)

## 2011-01-29 LAB — PROTIME-INR: INR: 3.92 — ABNORMAL HIGH (ref 0.00–1.49)

## 2011-01-29 LAB — CBC
HCT: 39.4 % (ref 39.0–52.0)
Hemoglobin: 12.4 g/dL — ABNORMAL LOW (ref 13.0–17.0)
MCHC: 31.5 g/dL (ref 30.0–36.0)
MCV: 87.2 fL (ref 78.0–100.0)
RDW: 14.1 % (ref 11.5–15.5)
WBC: 5.7 10*3/uL (ref 4.0–10.5)

## 2011-01-29 LAB — URINE CULTURE

## 2011-01-29 LAB — GLUCOSE, CAPILLARY: Glucose-Capillary: 92 mg/dL (ref 70–99)

## 2011-02-02 LAB — URINE CULTURE: Colony Count: NO GROWTH

## 2011-02-02 LAB — URINALYSIS, ROUTINE W REFLEX MICROSCOPIC
Ketones, ur: NEGATIVE mg/dL
Nitrite: NEGATIVE
Specific Gravity, Urine: 1.016 (ref 1.005–1.030)
pH: 6.5 (ref 5.0–8.0)

## 2011-02-02 LAB — DIFFERENTIAL
Basophils Relative: 0 % (ref 0–1)
Eosinophils Absolute: 0 10*3/uL (ref 0.0–0.7)
Monocytes Absolute: 0.5 10*3/uL (ref 0.1–1.0)
Monocytes Relative: 8 % (ref 3–12)
Neutrophils Relative %: 79 % — ABNORMAL HIGH (ref 43–77)

## 2011-02-02 LAB — POCT I-STAT, CHEM 8
Calcium, Ion: 1.17 mmol/L (ref 1.12–1.32)
Glucose, Bld: 88 mg/dL (ref 70–99)
HCT: 40 % (ref 39.0–52.0)
Hemoglobin: 13.6 g/dL (ref 13.0–17.0)

## 2011-02-02 LAB — CBC
MCHC: 32.2 g/dL (ref 30.0–36.0)
MCV: 88.3 fL (ref 78.0–100.0)
RBC: 4.27 MIL/uL (ref 4.22–5.81)

## 2011-02-02 LAB — CK TOTAL AND CKMB (NOT AT ARMC): Relative Index: 2.1 (ref 0.0–2.5)

## 2011-02-05 LAB — PROTIME-INR
INR: 1.3 (ref 0.00–1.49)
INR: 1.34 (ref 0.00–1.49)
INR: 2.77 — ABNORMAL HIGH (ref 0.00–1.49)
Prothrombin Time: 16.1 seconds — ABNORMAL HIGH (ref 11.6–15.2)
Prothrombin Time: 16.5 seconds — ABNORMAL HIGH (ref 11.6–15.2)
Prothrombin Time: 29 seconds — ABNORMAL HIGH (ref 11.6–15.2)

## 2011-02-05 LAB — DIFFERENTIAL
Eosinophils Absolute: 0.1 10*3/uL (ref 0.0–0.7)
Lymphocytes Relative: 35 % (ref 12–46)
Lymphs Abs: 1.5 10*3/uL (ref 0.7–4.0)
Monocytes Relative: 10 % (ref 3–12)
Neutro Abs: 2.3 10*3/uL (ref 1.7–7.7)
Neutrophils Relative %: 52 % (ref 43–77)

## 2011-02-05 LAB — CBC
HCT: 37.2 % — ABNORMAL LOW (ref 39.0–52.0)
Hemoglobin: 12 g/dL — ABNORMAL LOW (ref 13.0–17.0)
MCHC: 32.3 g/dL (ref 30.0–36.0)
MCHC: 32.4 g/dL (ref 30.0–36.0)
MCV: 88.3 fL (ref 78.0–100.0)
MCV: 88.5 fL (ref 78.0–100.0)
MCV: 89.1 fL (ref 78.0–100.0)
Platelets: 152 10*3/uL (ref 150–400)
Platelets: 152 10*3/uL (ref 150–400)
Platelets: 153 10*3/uL (ref 150–400)
RBC: 4.12 MIL/uL — ABNORMAL LOW (ref 4.22–5.81)
RBC: 4.13 MIL/uL — ABNORMAL LOW (ref 4.22–5.81)
RBC: 4.21 MIL/uL — ABNORMAL LOW (ref 4.22–5.81)
RDW: 13.5 % (ref 11.5–15.5)
RDW: 13.6 % (ref 11.5–15.5)
WBC: 4.1 10*3/uL (ref 4.0–10.5)
WBC: 4.2 10*3/uL (ref 4.0–10.5)
WBC: 4.4 10*3/uL (ref 4.0–10.5)

## 2011-02-05 LAB — BASIC METABOLIC PANEL
BUN: 11 mg/dL (ref 6–23)
Calcium: 9 mg/dL (ref 8.4–10.5)
Calcium: 9.1 mg/dL (ref 8.4–10.5)
Creatinine, Ser: 0.91 mg/dL (ref 0.4–1.5)
Creatinine, Ser: 0.95 mg/dL (ref 0.4–1.5)
GFR calc Af Amer: >60 mL/min (ref >60)
GFR calc non Af Amer: >60 mL/min (ref >60)
GFR calc non Af Amer: >60 mL/min (ref >60)
Glucose, Bld: 97 mg/dL (ref 70–99)
Potassium: 3.6 mEq/L (ref 3.5–5.1)
Sodium: 139 mEq/L (ref 135–145)

## 2011-02-05 LAB — CARDIAC PANEL(CRET KIN+CKTOT+MB+TROPI)
CK, MB: 2.4 ng/mL (ref 0.3–4.0)
CK, MB: 2.6 ng/mL (ref 0.3–4.0)
CK, MB: 2.7 ng/mL (ref 0.3–4.0)
Total CK: 104 U/L (ref 7–232)
Total CK: 129 U/L (ref 7–232)
Total CK: 146 U/L (ref 7–232)
Troponin I: 0.04 ng/mL (ref 0.00–0.06)

## 2011-02-05 LAB — POCT I-STAT, CHEM 8
Calcium, Ion: 0.96 mmol/L — ABNORMAL LOW (ref 1.12–1.32)
Chloride: 111 mEq/L (ref 96–112)
HCT: 38 % — ABNORMAL LOW (ref 39.0–52.0)
Sodium: 139 mEq/L (ref 135–145)

## 2011-02-05 LAB — LIPID PANEL
HDL: 67 mg/dL (ref >39)
Total CHOL/HDL Ratio: 3.1 RATIO
Triglycerides: 43 mg/dL (ref <150)

## 2011-02-20 LAB — CBC
Hemoglobin: 11.8 g/dL — ABNORMAL LOW (ref 13.0–17.0)
MCHC: 32.4 g/dL (ref 30.0–36.0)
MCHC: 32.5 g/dL (ref 30.0–36.0)
MCHC: 32.6 g/dL (ref 30.0–36.0)
MCHC: 32.6 g/dL (ref 30.0–36.0)
MCV: 86.8 fL (ref 78.0–100.0)
MCV: 87.8 fL (ref 78.0–100.0)
MCV: 87.8 fL (ref 78.0–100.0)
Platelets: 143 10*3/uL — ABNORMAL LOW (ref 150–400)
Platelets: 148 10*3/uL — ABNORMAL LOW (ref 150–400)
Platelets: 152 10*3/uL (ref 150–400)
Platelets: 157 10*3/uL (ref 150–400)
RBC: 4.33 MIL/uL (ref 4.22–5.81)
RDW: 13.6 % (ref 11.5–15.5)
RDW: 13.7 % (ref 11.5–15.5)
RDW: 14.2 % (ref 11.5–15.5)
RDW: 14.6 % (ref 11.5–15.5)
WBC: 5.1 10*3/uL (ref 4.0–10.5)
WBC: 6.3 10*3/uL (ref 4.0–10.5)

## 2011-02-20 LAB — DIFFERENTIAL
Basophils Absolute: 0 10*3/uL (ref 0.0–0.1)
Basophils Absolute: 0 10*3/uL (ref 0.0–0.1)
Basophils Absolute: 0 10*3/uL (ref 0.0–0.1)
Basophils Relative: 0 % (ref 0–1)
Basophils Relative: 0 % (ref 0–1)
Eosinophils Absolute: 0.1 10*3/uL (ref 0.0–0.7)
Eosinophils Absolute: 0.1 10*3/uL (ref 0.0–0.7)
Eosinophils Relative: 2 % (ref 0–5)
Lymphocytes Relative: 11 % — ABNORMAL LOW (ref 12–46)
Lymphocytes Relative: 24 % (ref 12–46)
Lymphocytes Relative: 25 % (ref 12–46)
Lymphs Abs: 0.7 10*3/uL (ref 0.7–4.0)
Lymphs Abs: 1.4 10*3/uL (ref 0.7–4.0)
Monocytes Absolute: 0.5 10*3/uL (ref 0.1–1.0)
Monocytes Relative: 12 % (ref 3–12)
Monocytes Relative: 12 % (ref 3–12)
Monocytes Relative: 9 % (ref 3–12)
Neutro Abs: 3 10*3/uL (ref 1.7–7.7)
Neutro Abs: 3 10*3/uL (ref 1.7–7.7)
Neutro Abs: 4.9 10*3/uL (ref 1.7–7.7)
Neutrophils Relative %: 59 % (ref 43–77)
Neutrophils Relative %: 61 % (ref 43–77)

## 2011-02-20 LAB — COMPREHENSIVE METABOLIC PANEL
ALT: 10 U/L (ref 0–53)
ALT: 8 U/L (ref 0–53)
AST: 13 U/L (ref 0–37)
AST: 14 U/L (ref 0–37)
Albumin: 3.4 g/dL — ABNORMAL LOW (ref 3.5–5.2)
Alkaline Phosphatase: 61 U/L (ref 39–117)
BUN: 20 mg/dL (ref 6–23)
BUN: 23 mg/dL (ref 6–23)
CO2: 27 mEq/L (ref 19–32)
CO2: 27 mEq/L (ref 19–32)
Calcium: 9 mg/dL (ref 8.4–10.5)
Calcium: 9.1 mg/dL (ref 8.4–10.5)
Chloride: 103 mEq/L (ref 96–112)
Chloride: 105 mEq/L (ref 96–112)
Creatinine, Ser: 0.89 mg/dL (ref 0.4–1.5)
Creatinine, Ser: 1.18 mg/dL (ref 0.4–1.5)
GFR calc Af Amer: >60 mL/min (ref >60)
GFR calc Af Amer: >60 mL/min (ref >60)
GFR calc Af Amer: >60 mL/min (ref >60)
GFR calc Af Amer: >60 mL/min (ref >60)
GFR calc non Af Amer: >60 mL/min (ref >60)
GFR calc non Af Amer: >60 mL/min (ref >60)
GFR calc non Af Amer: >60 mL/min (ref >60)
Glucose, Bld: 88 mg/dL (ref 70–99)
Glucose, Bld: 93 mg/dL (ref 70–99)
Sodium: 137 mEq/L (ref 135–145)
Sodium: 137 mEq/L (ref 135–145)
Sodium: 138 mEq/L (ref 135–145)
Total Bilirubin: 0.6 mg/dL (ref 0.3–1.2)
Total Bilirubin: 0.7 mg/dL (ref 0.3–1.2)
Total Protein: 6.8 g/dL (ref 6.0–8.3)
Total Protein: 7 g/dL (ref 6.0–8.3)
Total Protein: 7.6 g/dL (ref 6.0–8.3)

## 2011-02-20 LAB — URINALYSIS, ROUTINE W REFLEX MICROSCOPIC
Ketones, ur: NEGATIVE mg/dL
Nitrite: NEGATIVE
Protein, ur: NEGATIVE mg/dL
pH: 5.5 (ref 5.0–8.0)

## 2011-02-20 LAB — BASIC METABOLIC PANEL
BUN: 10 mg/dL (ref 6–23)
CO2: 29 mEq/L (ref 19–32)
Chloride: 100 mEq/L (ref 96–112)
Creatinine, Ser: 0.92 mg/dL (ref 0.4–1.5)
GFR calc Af Amer: >60 mL/min (ref >60)
Glucose, Bld: 105 mg/dL — ABNORMAL HIGH (ref 70–99)

## 2011-02-20 LAB — CK TOTAL AND CKMB (NOT AT ARMC)
CK, MB: 3.9 ng/mL (ref 0.3–4.0)
Relative Index: 0.9 (ref 0.0–2.5)

## 2011-02-20 LAB — CARDIAC PANEL(CRET KIN+CKTOT+MB+TROPI)
CK, MB: 2.3 ng/mL (ref 0.3–4.0)
CK, MB: 3.4 ng/mL (ref 0.3–4.0)
Relative Index: 0.9 (ref 0.0–2.5)
Total CK: 353 U/L — ABNORMAL HIGH (ref 7–232)
Total CK: 366 U/L — ABNORMAL HIGH (ref 7–232)
Troponin I: 0.03 ng/mL (ref 0.00–0.06)

## 2011-02-20 LAB — T4: T4, Total: 6.9 ug/dL (ref 5.0–12.5)

## 2011-02-20 LAB — T3: T3, Total: 72.2 ng/dl — ABNORMAL LOW (ref 80.0–204.0)

## 2011-02-20 LAB — MAGNESIUM
Magnesium: 2 mg/dL (ref 1.5–2.5)
Magnesium: 2.1 mg/dL (ref 1.5–2.5)

## 2011-02-25 LAB — CBC
HCT: 31.6 % — ABNORMAL LOW (ref 39.0–52.0)
HCT: 25 % — ABNORMAL LOW (ref 39.0–52.0)
HCT: 27.3 % — ABNORMAL LOW (ref 39.0–52.0)
HCT: 27.9 % — ABNORMAL LOW (ref 39.0–52.0)
HCT: 28 % — ABNORMAL LOW (ref 39.0–52.0)
HCT: 31.7 % — ABNORMAL LOW (ref 39.0–52.0)
HCT: 32.9 % — ABNORMAL LOW (ref 39.0–52.0)
HCT: 34.4 % — ABNORMAL LOW (ref 39.0–52.0)
Hemoglobin: 10.5 g/dL — ABNORMAL LOW (ref 13.0–17.0)
Hemoglobin: 11.1 g/dL — ABNORMAL LOW (ref 13.0–17.0)
Hemoglobin: 10.3 g/dL — ABNORMAL LOW (ref 13.0–17.0)
Hemoglobin: 10.4 g/dL — ABNORMAL LOW (ref 13.0–17.0)
Hemoglobin: 11.4 g/dL — ABNORMAL LOW (ref 13.0–17.0)
Hemoglobin: 8.2 g/dL — ABNORMAL LOW (ref 13.0–17.0)
Hemoglobin: 9 g/dL — ABNORMAL LOW (ref 13.0–17.0)
Hemoglobin: 9.1 g/dL — ABNORMAL LOW (ref 13.0–17.0)
Hemoglobin: 9.2 g/dL — ABNORMAL LOW (ref 13.0–17.0)
Hemoglobin: 9.3 g/dL — ABNORMAL LOW (ref 13.0–17.0)
MCHC: 32.1 g/dL (ref 30.0–36.0)
MCHC: 32.5 g/dL (ref 30.0–36.0)
MCHC: 32.7 g/dL (ref 30.0–36.0)
MCHC: 32.8 g/dL (ref 30.0–36.0)
MCHC: 32.9 g/dL (ref 30.0–36.0)
MCHC: 33 g/dL (ref 30.0–36.0)
MCHC: 33.2 g/dL (ref 30.0–36.0)
MCV: 85.4 fL (ref 78.0–100.0)
MCV: 85.1 fL (ref 78.0–100.0)
MCV: 86.6 fL (ref 78.0–100.0)
MCV: 87.6 fL (ref 78.0–100.0)
Platelets: 280 10*3/uL (ref 150–400)
Platelets: 402 10*3/uL — ABNORMAL HIGH (ref 150–400)
Platelets: 472 10*3/uL — ABNORMAL HIGH (ref 150–400)
Platelets: 514 10*3/uL — ABNORMAL HIGH (ref 150–400)
RBC: 3.67 MIL/uL — ABNORMAL LOW (ref 4.22–5.81)
RBC: 3.94 MIL/uL — ABNORMAL LOW (ref 4.22–5.81)
RBC: 3.23 MIL/uL — ABNORMAL LOW (ref 4.22–5.81)
RBC: 3.25 MIL/uL — ABNORMAL LOW (ref 4.22–5.81)
RBC: 3.65 MIL/uL — ABNORMAL LOW (ref 4.22–5.81)
RBC: 3.65 MIL/uL — ABNORMAL LOW (ref 4.22–5.81)
RBC: 4.09 MIL/uL — ABNORMAL LOW (ref 4.22–5.81)
RDW: 15.1 % (ref 11.5–15.5)
RDW: 15.3 % (ref 11.5–15.5)
RDW: 15.4 % (ref 11.5–15.5)
RDW: 15.5 % (ref 11.5–15.5)
RDW: 15.9 % — ABNORMAL HIGH (ref 11.5–15.5)
RDW: 16.4 % — ABNORMAL HIGH (ref 11.5–15.5)
WBC: 3.7 10*3/uL — ABNORMAL LOW (ref 4.0–10.5)
WBC: 3.9 10*3/uL — ABNORMAL LOW (ref 4.0–10.5)
WBC: 13.7 10*3/uL — ABNORMAL HIGH (ref 4.0–10.5)
WBC: 3.6 10*3/uL — ABNORMAL LOW (ref 4.0–10.5)
WBC: 4.1 10*3/uL (ref 4.0–10.5)
WBC: 8.7 10*3/uL (ref 4.0–10.5)

## 2011-02-25 LAB — BASIC METABOLIC PANEL
BUN: 10 mg/dL (ref 6–23)
BUN: 4 mg/dL — ABNORMAL LOW (ref 6–23)
BUN: 6 mg/dL (ref 6–23)
BUN: 6 mg/dL (ref 6–23)
BUN: 8 mg/dL (ref 6–23)
CO2: 26 mEq/L (ref 19–32)
CO2: 26 mEq/L (ref 19–32)
CO2: 27 mEq/L (ref 19–32)
CO2: 27 mEq/L (ref 19–32)
Calcium: 8 mg/dL — ABNORMAL LOW (ref 8.4–10.5)
Calcium: 8.2 mg/dL — ABNORMAL LOW (ref 8.4–10.5)
Calcium: 8.8 mg/dL (ref 8.4–10.5)
Chloride: 101 mEq/L (ref 96–112)
Chloride: 102 mEq/L (ref 96–112)
Chloride: 104 mEq/L (ref 96–112)
Chloride: 106 mEq/L (ref 96–112)
Creatinine, Ser: 0.72 mg/dL (ref 0.4–1.5)
Creatinine, Ser: 0.75 mg/dL (ref 0.4–1.5)
GFR calc Af Amer: >60 mL/min (ref >60)
GFR calc Af Amer: >60 mL/min (ref >60)
GFR calc Af Amer: >60 mL/min (ref >60)
GFR calc Af Amer: >60 mL/min (ref >60)
GFR calc Af Amer: >60 mL/min (ref >60)
GFR calc non Af Amer: >60 mL/min (ref >60)
GFR calc non Af Amer: >60 mL/min (ref >60)
GFR calc non Af Amer: >60 mL/min (ref >60)
GFR calc non Af Amer: >60 mL/min (ref >60)
GFR calc non Af Amer: >60 mL/min (ref >60)
GFR calc non Af Amer: >60 mL/min (ref >60)
Glucose, Bld: 101 mg/dL — ABNORMAL HIGH (ref 70–99)
Glucose, Bld: 126 mg/dL — ABNORMAL HIGH (ref 70–99)
Glucose, Bld: 87 mg/dL (ref 70–99)
Glucose, Bld: 95 mg/dL (ref 70–99)
Glucose, Bld: 97 mg/dL (ref 70–99)
Potassium: 4.1 mEq/L (ref 3.5–5.1)
Potassium: 3 mEq/L — ABNORMAL LOW (ref 3.5–5.1)
Potassium: 3.1 mEq/L — ABNORMAL LOW (ref 3.5–5.1)
Potassium: 3.3 mEq/L — ABNORMAL LOW (ref 3.5–5.1)
Potassium: 3.4 mEq/L — ABNORMAL LOW (ref 3.5–5.1)
Potassium: 3.7 mEq/L (ref 3.5–5.1)
Potassium: 4.3 mEq/L (ref 3.5–5.1)
Sodium: 136 mEq/L (ref 135–145)
Sodium: 133 mEq/L — ABNORMAL LOW (ref 135–145)
Sodium: 134 mEq/L — ABNORMAL LOW (ref 135–145)
Sodium: 135 mEq/L (ref 135–145)
Sodium: 136 mEq/L (ref 135–145)
Sodium: 138 mEq/L (ref 135–145)
Sodium: 138 mEq/L (ref 135–145)

## 2011-02-25 LAB — MAGNESIUM: Magnesium: 2 mg/dL (ref 1.5–2.5)

## 2011-02-25 LAB — CROSSMATCH: Antibody Screen: NEGATIVE

## 2011-02-25 LAB — PROTIME-INR
INR: 1.4 (ref 0.00–1.49)
INR: 2.1 — ABNORMAL HIGH (ref 0.00–1.49)
INR: 2.2 — ABNORMAL HIGH (ref 0.00–1.49)
INR: 2.6 — ABNORMAL HIGH (ref 0.00–1.49)
INR: 2.7 — ABNORMAL HIGH (ref 0.00–1.49)
Prothrombin Time: 29.2 seconds — ABNORMAL HIGH (ref 11.6–15.2)
Prothrombin Time: 25.7 seconds — ABNORMAL HIGH (ref 11.6–15.2)
Prothrombin Time: 29.6 seconds — ABNORMAL HIGH (ref 11.6–15.2)
Prothrombin Time: 31.6 seconds — ABNORMAL HIGH (ref 11.6–15.2)

## 2011-02-25 LAB — CLOSTRIDIUM DIFFICILE EIA: C difficile Toxins A+B, EIA: NEGATIVE

## 2011-02-25 LAB — ABO/RH: ABO/RH(D): AB POS

## 2011-02-25 LAB — BRAIN NATRIURETIC PEPTIDE: Pro B Natriuretic peptide (BNP): 254 pg/mL — ABNORMAL HIGH (ref 0.0–100.0)

## 2011-02-26 LAB — BASIC METABOLIC PANEL
BUN: 15 mg/dL (ref 6–23)
BUN: 17 mg/dL (ref 6–23)
BUN: 18 mg/dL (ref 6–23)
Calcium: 8.2 mg/dL — ABNORMAL LOW (ref 8.4–10.5)
Chloride: 102 mEq/L (ref 96–112)
Creatinine, Ser: 0.93 mg/dL (ref 0.4–1.5)
Creatinine, Ser: 1.13 mg/dL (ref 0.4–1.5)
GFR calc non Af Amer: >60 mL/min (ref >60)
GFR calc non Af Amer: >60 mL/min (ref >60)
Glucose, Bld: 112 mg/dL — ABNORMAL HIGH (ref 70–99)
Glucose, Bld: 115 mg/dL — ABNORMAL HIGH (ref 70–99)
Potassium: 3.4 mEq/L — ABNORMAL LOW (ref 3.5–5.1)
Potassium: 3.4 mEq/L — ABNORMAL LOW (ref 3.5–5.1)

## 2011-02-26 LAB — TROPONIN I
Troponin I: 0.04 ng/mL (ref 0.00–0.06)
Troponin I: 0.04 ng/mL (ref 0.00–0.06)
Troponin I: 0.05 ng/mL (ref 0.00–0.06)

## 2011-02-26 LAB — POCT CARDIAC MARKERS
CKMB, poc: <1 ng/mL — ABNORMAL LOW (ref 1.0–8.0)
Troponin i, poc: <0.05 ng/mL (ref 0.00–0.09)

## 2011-02-26 LAB — PHOSPHORUS: Phosphorus: 2.3 mg/dL (ref 2.3–4.6)

## 2011-02-26 LAB — PROTIME-INR: Prothrombin Time: 18.1 seconds — ABNORMAL HIGH (ref 11.6–15.2)

## 2011-02-26 LAB — CK TOTAL AND CKMB (NOT AT ARMC)
CK, MB: 0.9 ng/mL (ref 0.3–4.0)
CK, MB: 1.3 ng/mL (ref 0.3–4.0)
Relative Index: 1.2 (ref 0.0–2.5)
Relative Index: INVALID (ref 0.0–2.5)
Total CK: 104 U/L (ref 7–232)
Total CK: 84 U/L (ref 7–232)

## 2011-02-26 LAB — CBC
HCT: 28.1 % — ABNORMAL LOW (ref 39.0–52.0)
HCT: 29.9 % — ABNORMAL LOW (ref 39.0–52.0)
MCHC: 32.8 g/dL (ref 30.0–36.0)
MCHC: 33.5 g/dL (ref 30.0–36.0)
MCV: 84.4 fL (ref 78.0–100.0)
MCV: 85.5 fL (ref 78.0–100.0)
MCV: 85.5 fL (ref 78.0–100.0)
MCV: 86.1 fL (ref 78.0–100.0)
Platelets: 234 10*3/uL (ref 150–400)
Platelets: 241 10*3/uL (ref 150–400)
Platelets: 279 10*3/uL (ref 150–400)
RDW: 14.3 % (ref 11.5–15.5)
RDW: 14.6 % (ref 11.5–15.5)
RDW: 14.7 % (ref 11.5–15.5)
WBC: 14.2 10*3/uL — ABNORMAL HIGH (ref 4.0–10.5)

## 2011-02-26 LAB — CULTURE, RESPIRATORY W GRAM STAIN

## 2011-02-26 LAB — VANCOMYCIN, TROUGH: Vancomycin Tr: <5 ug/mL — ABNORMAL LOW (ref 10.0–20.0)

## 2011-02-26 LAB — DIFFERENTIAL
Basophils Absolute: 0 10*3/uL (ref 0.0–0.1)
Basophils Relative: 0 % (ref 0–1)
Eosinophils Absolute: 0 10*3/uL (ref 0.0–0.7)
Neutro Abs: 14.1 10*3/uL — ABNORMAL HIGH (ref 1.7–7.7)
Neutrophils Relative %: 89 % — ABNORMAL HIGH (ref 43–77)

## 2011-02-26 LAB — COMPREHENSIVE METABOLIC PANEL
Albumin: 3.1 g/dL — ABNORMAL LOW (ref 3.5–5.2)
Alkaline Phosphatase: 107 U/L (ref 39–117)
BUN: 16 mg/dL (ref 6–23)
Calcium: 8.9 mg/dL (ref 8.4–10.5)
Creatinine, Ser: 1.02 mg/dL (ref 0.4–1.5)
Glucose, Bld: 175 mg/dL — ABNORMAL HIGH (ref 70–99)
Potassium: 3.6 mEq/L (ref 3.5–5.1)
Total Protein: 7.2 g/dL (ref 6.0–8.3)

## 2011-02-26 LAB — URINE CULTURE: Culture: NO GROWTH

## 2011-02-26 LAB — CULTURE, BLOOD (ROUTINE X 2)
Culture: NO GROWTH
Culture: NO GROWTH

## 2011-02-26 LAB — POCT I-STAT, CHEM 8
Calcium, Ion: 1.13 mmol/L (ref 1.12–1.32)
Chloride: 104 mEq/L (ref 96–112)
HCT: 35 % — ABNORMAL LOW (ref 39.0–52.0)
Hemoglobin: 11.9 g/dL — ABNORMAL LOW (ref 13.0–17.0)
Potassium: 3.9 mEq/L (ref 3.5–5.1)

## 2011-02-26 LAB — ALBUMIN: Albumin: 2.4 g/dL — ABNORMAL LOW (ref 3.5–5.2)

## 2011-02-26 LAB — URINALYSIS, ROUTINE W REFLEX MICROSCOPIC
Bilirubin Urine: NEGATIVE
Nitrite: NEGATIVE
Specific Gravity, Urine: 1.022 (ref 1.005–1.030)
Urobilinogen, UA: 1 mg/dL (ref 0.0–1.0)
pH: 7 (ref 5.0–8.0)

## 2011-02-26 LAB — URINE MICROSCOPIC-ADD ON

## 2011-02-26 LAB — LEGIONELLA ANTIGEN, URINE

## 2011-02-26 LAB — BRAIN NATRIURETIC PEPTIDE: Pro B Natriuretic peptide (BNP): 272 pg/mL — ABNORMAL HIGH (ref 0.0–100.0)

## 2011-02-26 LAB — APTT: aPTT: 34 seconds (ref 24–37)

## 2011-02-26 LAB — MAGNESIUM: Magnesium: 1.8 mg/dL (ref 1.5–2.5)

## 2011-02-26 LAB — EXPECTORATED SPUTUM ASSESSMENT W GRAM STAIN, RFLX TO RESP C

## 2011-02-27 LAB — BASIC METABOLIC PANEL
BUN: 23 mg/dL (ref 6–23)
GFR calc non Af Amer: >60 mL/min (ref >60)
Glucose, Bld: 110 mg/dL — ABNORMAL HIGH (ref 70–99)
Potassium: 3.8 mEq/L (ref 3.5–5.1)

## 2011-02-27 LAB — CARDIAC PANEL(CRET KIN+CKTOT+MB+TROPI)
CK, MB: 1.6 ng/mL (ref 0.3–4.0)
CK, MB: 1.9 ng/mL (ref 0.3–4.0)
CK, MB: 2.1 ng/mL (ref 0.3–4.0)
Relative Index: 0.7 (ref 0.0–2.5)
Total CK: 267 U/L — ABNORMAL HIGH (ref 7–232)
Total CK: 267 U/L — ABNORMAL HIGH (ref 7–232)

## 2011-02-27 LAB — HOMOCYSTEINE: Homocysteine: 10.9 umol/L (ref 4.0–15.4)

## 2011-02-27 LAB — URINALYSIS, ROUTINE W REFLEX MICROSCOPIC
Bilirubin Urine: NEGATIVE
Bilirubin Urine: NEGATIVE
Glucose, UA: NEGATIVE mg/dL
Hgb urine dipstick: NEGATIVE
Ketones, ur: NEGATIVE mg/dL
Nitrite: NEGATIVE
Protein, ur: 100 mg/dL — AB
Specific Gravity, Urine: 1.015 (ref 1.005–1.030)
Specific Gravity, Urine: 1.016 (ref 1.005–1.030)
Urobilinogen, UA: 2 mg/dL — ABNORMAL HIGH (ref 0.0–1.0)
pH: 7 (ref 5.0–8.0)

## 2011-02-27 LAB — LIPID PANEL
Cholesterol: 188 mg/dL (ref 0–200)
LDL Cholesterol: 105 mg/dL — ABNORMAL HIGH (ref 0–99)
VLDL: 8 mg/dL (ref 0–40)

## 2011-02-27 LAB — COMPREHENSIVE METABOLIC PANEL
ALT: 15 U/L (ref 0–53)
AST: 21 U/L (ref 0–37)
Albumin: 3.4 g/dL — ABNORMAL LOW (ref 3.5–5.2)
Calcium: 8.9 mg/dL (ref 8.4–10.5)
GFR calc Af Amer: >60 mL/min (ref >60)
Sodium: 140 mEq/L (ref 135–145)
Total Protein: 7 g/dL (ref 6.0–8.3)

## 2011-02-27 LAB — PROTIME-INR: INR: 1.2 (ref 0.00–1.49)

## 2011-02-27 LAB — CBC
HCT: 34.7 % — ABNORMAL LOW (ref 39.0–52.0)
MCHC: 33.4 g/dL (ref 30.0–36.0)
MCV: 87.2 fL (ref 78.0–100.0)
Platelets: 140 10*3/uL — ABNORMAL LOW (ref 150–400)
Platelets: 156 10*3/uL (ref 150–400)
RBC: 3.97 MIL/uL — ABNORMAL LOW (ref 4.22–5.81)
RDW: 13.1 % (ref 11.5–15.5)
RDW: 13.5 % (ref 11.5–15.5)

## 2011-02-27 LAB — DIFFERENTIAL
Basophils Absolute: 0 10*3/uL (ref 0.0–0.1)
Basophils Relative: 0 % (ref 0–1)
Eosinophils Absolute: 0 10*3/uL (ref 0.0–0.7)
Eosinophils Absolute: 0 10*3/uL (ref 0.0–0.7)
Eosinophils Relative: 0 % (ref 0–5)
Eosinophils Relative: 1 % (ref 0–5)
Lymphocytes Relative: 8 % — ABNORMAL LOW (ref 12–46)
Lymphs Abs: 0.9 10*3/uL (ref 0.7–4.0)
Monocytes Relative: 11 % (ref 3–12)

## 2011-02-27 LAB — GLUCOSE, CAPILLARY: Glucose-Capillary: 109 mg/dL — ABNORMAL HIGH (ref 70–99)

## 2011-02-27 LAB — URINE CULTURE

## 2011-02-27 LAB — RPR: RPR Ser Ql: NONREACTIVE

## 2011-02-27 LAB — URINE MICROSCOPIC-ADD ON

## 2011-02-27 LAB — APTT: aPTT: 24 seconds (ref 24–37)

## 2011-03-07 ENCOUNTER — Ambulatory Visit: Payer: BC Managed Care – PPO | Admitting: Family Medicine

## 2011-03-07 ENCOUNTER — Ambulatory Visit (INDEPENDENT_AMBULATORY_CARE_PROVIDER_SITE_OTHER): Payer: BC Managed Care – PPO | Admitting: Family Medicine

## 2011-03-07 DIAGNOSIS — J0100 Acute maxillary sinusitis, unspecified: Secondary | ICD-10-CM

## 2011-03-07 DIAGNOSIS — J209 Acute bronchitis, unspecified: Secondary | ICD-10-CM

## 2011-03-07 DIAGNOSIS — M79609 Pain in unspecified limb: Secondary | ICD-10-CM

## 2011-04-01 NOTE — H&P (Signed)
NAMELEVANTE, SIMONES               ACCOUNT NO.:  [id number]   MEDICAL RECORD NO.:  [id number]          PATIENT TYPE:  INP   LOCATION:  1529                         FACILITY:  [facility]   PHYSICIAN:  Lonia Blood, M.D.      DATE OF BIRTH:  [birth year]   DATE OF ADMISSION:  03/12/2009  DATE OF DISCHARGE:                              HISTORY & PHYSICAL   PRIMARY CARE PHYSICIAN:  Dr. Sharlot Gowda   PRESENTING COMPLAINT:  Fever and cough.   HISTORY OF PRESENT ILLNESS:  The patient is an 77 year old gentleman who  is a resident of [facility] who was brought in  secondary to having a fever up to 102.  He was also having some cough  and mild shortness of breath.  He denied any chest pain.  He denied any  nausea or vomiting.  No diarrhea.  Fever symptoms have been going on for  about 24 hours.  No recent sick contacts.  His past medical history is  significant for Alzheimer's disease, GERD, gout, hypertension,  osteoporosis.   ALLERGIES:  He has no known drug allergies.   Medications include:  1. Alendronate 70 mg tablet weekly.  2. Namenda 5-10 kg Dose Pack daily.  3. Amlodipine 5 mg daily.  4. Lisinopril 20 mg daily.  5. Meloxicam 50 mg daily.  6. Omeprazole 20 mg daily.  7. Aricept 5 mg q.h.s.  8. Ibuprofen 800 mg p.r.n. for pain.  9. Endocet 25 mg oral suspension.  10.Oxycontin 60 mg q.8 h. p.r.n. for pain.  11.Also the 50 mg capsule of Indomethacin q.8 h. p.r.n.  12.Mucinex 600 mg tablets 1 tablet b.i.d. for 7 days.  13.OxyCodon with acetaminophen 5/325 q.4 h. p.r.n.  14.Tramadol 50 mg tablets 1 q.6 h. p.r.n. for pain.  15.Zolpidem 5 mg p.r.n.   Please note that the patient is now being taken care of by Dr. Baltazar Najjar.   SOCIAL HISTORY:  The patient is a resident of [facility].  No tobacco, alcohol, or active IV drug use.   FAMILY HISTORY:  Noncontributory due to the patient's age.   REVIEW OF SYSTEMS:  14-point review of systems  negative except per HPI.   PHYSICAL EXAMINATION:  VITAL SIGNS:  Temperature is 98.2, blood pressure  136/77, pulse 92, respiratory rate 26, sats 92% on 2 liters.  GENERAL:  The patient is an awake, alert, pleasant man in no acute  distress.  HEENT:  PERRLA, EOMI.  NECK:  Supple.  No JVD.  RESPIRATORY:  He has poor air entry bilaterally especially at the bases  with some basal crackles.  CARDIOVASCULAR SYSTEM:  S1 and S2, no murmur.  ABDOMEN:  Soft, nontender, with positive bowel sounds.  EXTREMITIES:  No edema, cyanosis, or clubbing.   LABS:  Sodium 136, potassium 3.9, chloride 104, BUN 25, creatinine 1.3,  glucose 120, ionized calcium 1.13.  White count is 16.0, hemoglobin  10.8, platelet count 255,000 with left shift.  ANC of 14.1.  Urinalysis  essentially negative.  Chest x-ray showed cardiomegaly without  congestive heart failure, suspicious for  early left lower lobe, left  upper lobe, and right lower lobe pneumonia.   ASSESSMENT:  This is an 77 year old gentleman presenting with fever,  cough, and what appears to be bilateral pneumonia.  The patient also has  increasing dementia, hypertension, and gout, also what appears to be  gastroparesis, as well as frequent falls.   PLAN:  1. Bilateral pneumonia.  Since the patient is from a facility, we will      presume this to be related to healthcare-associated pneumonia.      Therefore, we will put him on some vancomycin and Zosyn.  Put him      on some oxygen.  Also, some nebulizers as needed.  2. Hypertension.  Blood pressure seems reasonable.  I will continue      his home medicine.  3. History of gout.  We will also put him on his home medicine p.r.n.  4. Dementia.  We will put him about on his Namenda as well as the      Aricept as needed.  5. Frequent falls.  We will put the patient on fall precautions.   Further treatment will depend on how the patient does while in the  hospital.      Lonia Blood, M.D.   Electronically Signed     LG/MEDQ  D:  03/13/2009  T:  03/13/2009  Job:  [id number]

## 2011-04-01 NOTE — Discharge Summary (Signed)
Ricky [name], Ricky [name]               ACCOUNT NO.:  [id number]   MEDICAL RECORD NO.:  [id number]          PATIENT TYPE:  INP   LOCATION:  4736                         FACILITY:  [facility]   PHYSICIAN:  Michelene Gardener, MD    DATE OF BIRTH:  08-25-1917   DATE OF ADMISSION:  01/16/2009  DATE OF DISCHARGE:  01/20/2009                               DISCHARGE SUMMARY   Initial discharge summary was dictated on January 19, 2009.  Updated  discharge summary, since dictation of last discharge summary there is no  acute change.  The patient continued to be fine and actually he feels  better.  There is no recurrence of syncope.  There is no chest pain.  There is no shortness of breath.  Telemetry monitor showed no evidence  of arrhythmia.  He will be discharged on the same medications dictated  before and those include:  1. Celebrex 200 mg p.o. twice daily.  2. Lisinopril 20 mg once a day.  3. Norvasc 5 mg once a day.  4. Fsoamax 70 mg once a day.  5. Indomethacin 50 mg q.8 hours as needed.   For more details about his current hospitalization please check previous  dictated discharge summary.   Total assessment time is 40 minutes.      Michelene Gardener, MD  Electronically Signed     NAE/MEDQ  D:  01/20/2009  T:  01/20/2009  Job:  [id number]

## 2011-04-01 NOTE — Discharge Summary (Signed)
NAMEMarland Kitchen  HAKIM, MINNIEFIELD               ACCOUNT NO.:  [id number]   MEDICAL RECORD NO.:  [id number]          PATIENT TYPE:  INP   LOCATION:  4736                         FACILITY:  [facility]   PHYSICIAN:  Michelene Gardener, MD    DATE OF BIRTH:  15-Feb-1917   DATE OF ADMISSION:  01/16/2009  DATE OF DISCHARGE:  01/22/2009                               DISCHARGE SUMMARY   ADDENDUM:  For more details about current hospitalization, please refer  to previously dictated discharge summaries on January 19, 2009 and January 20, 2009.   Since that time, the patient remains stable.  No arrhythmias were seen  in telemetry, and actually his telemetry was discontinued on January 21, 2009.  The patient was supposed to be discharged over the weekend, but  his discharge was not complete because the facility did not accept him  over the weekend.  He will be discharged to the skilled nursing facility  today on previously dictated discharge medications.   Total assessment time is 40 minutes.      Michelene Gardener, MD  Electronically Signed     NAE/MEDQ  D:  01/22/2009  T:  01/22/2009  Job:  [id number]

## 2011-04-01 NOTE — Consult Note (Signed)
NAME:  Ricky [name], Ricky [name]               ACCOUNT NO.:  [id number]   MEDICAL RECORD NO.:  [id number]          PATIENT TYPE:  INP   LOCATION:  1529                         FACILITY:  [facility]   PHYSICIAN:  Coralyn Helling, MD        DATE OF BIRTH:  May 09, 1917   DATE OF CONSULTATION:  03/28/2009  DATE OF DISCHARGE:                                 CONSULTATION   REFERRING PHYSICIAN:  [facility].   REASON FOR CONSULTATION:  Left lower lobe cavitary pneumonia.   Mr. Miske is an 77 year old male who was admitted on April 28 with  fever, sputum cultures positive for MRSA.  A CT scan of the chest showed  a cavitary lesion in the left lower lobe.  ID consultation was obtained  and he was started on Zosyn and Zyvox.  He was also noted to have  bilateral pulmonary embolism and was started on heparin and Coumadin for  this.  Follow-up CT scan of his chest showed persistence of the left  lower lobe cavitary lesion, however, with improvement in appearance.  Pulmonary consultation was requested to evaluate whether the patient  would be a candidate for further intervention such as bronchoscopy or  surgical evaluation.   PAST MEDICAL HISTORY:  1. Significant hypertension.  2. Syncope.  3. Dementia.  4. Gastroesophageal reflux disease.  5. Gout.  6. Osteoporosis.   HE HAS NO KNOWN DRUG ALLERGIES.   CURRENT MEDICATIONS:  1. Aricept 5 mg nightly.  2. Aspirin 81 mg daily.  3. Coumadin.  4. Mobic 50 mg daily.  5. Namenda 10 mg daily.  6. Norvasc 5 mg daily.  7. Prinivil 20 mg daily.  8. Protonix 40 mg daily.  9. Robitussin as needed.  10.Zosyn intravenously.  11.Zyvox intravenously.   SOCIAL HISTORY:  There is no reported history of tobacco abuse.  The  patient was residing in a nursing home prior to this hospitalization.   FAMILY HISTORY:  Noncontributory.   PHYSICAL EXAM:  He is seen in his hospital room.  He is awake and alert,  does not appear to be in acute distress.  He is able  to follow simple  commands.  Blood pressure is 126/65, heart rate of 62, respiratory rate  is 18, temperature is 98, oxygen saturation is 98% on room air.  HEENT:  He wears dentures.  There is no sinus tenderness, there is no  oral lesions, no lymphadenopathy, no jugular venous distention.  HEART:  With S1 - S2, no murmur.  CHEST:  He had good air entry bilaterally.  There is no wheezing or  rales.  ABDOMEN:  Thin, soft, nontender, positive bowel sounds.  GU:  Has a Foley catheter in place.  EXTREMITIES:  There is no edema, cyanosis or clubbing.  NEUROLOGIC:  He is able to move all four extremities.   Hemoglobin was 10.4, hematocrit of 32, WBC is 4.2, platelet count is  424, sodium is 36, potassium is 0.7, chloride is 104, CO2 is 25, BUN is  8, creatinine is 0.8, glucose is 87, INR is 2.4, calcium is 8.2, BNP  was  254.  CT scan from May 10 showed decreased size of the superior segment  of the left lower lobe cavitary lesion and redemonstration of the right  lower lobe of pulmonary emboli.   IMPRESSION:  1. Left lower lobe cavitary pneumonia.  He appears to be improving      radiographically and clinically.  I will continue him on his      current antibiotic course as detailed by Infectious Disease.  I do      not think that bronchoscopy or surgical intervention is indicated      at this time.  What I would recommend is to follow up on his chest      x-ray, if he has persistence of his lesion or if he is developing      pleural effusion, then intervention may be needed at this time.      However, I think the risk of putting him through a procedure would      outweigh the benefit at this point.  2. Pulmonary embolism. His INR is therapeutic and this is being      adequately managed by the hospitalist team.   Please feel free to call if we can assist with any further questions  during this hospitalization.      Coralyn Helling, MD  Electronically Signed     VS/MEDQ  D:   03/28/2009  T:  03/28/2009  Job:  [id number]

## 2011-04-01 NOTE — Group Therapy Note (Signed)
NAME:  Ricky [name], Ricky [name] NO.:  [id number]   MEDICAL RECORD NO.:  [id number]          PATIENT TYPE:  INP   LOCATION:  1529                         FACILITY:  [facility]   PHYSICIAN:  Marcellus Scott, MD     DATE OF BIRTH:  October 11, 1917                                 PROGRESS NOTE   ADDENDUM INTERIM DISCHARGE SUMMARY   PRIMARY MEDICAL DOCTOR:  Dr. Sharlot Gowda   This is an addendum to the interim discharge summary that was done by  Dr. Glade Lloyd on Mar 20, 2009 and will update events since.   DISCHARGE DIAGNOSES:  1. Acute pulmonary embolism, anticoagulated on Coumadin.  2. Left lower lobe cavitary pneumonia/lung abscess.  3. Anemia status post 2 units of packed red blood cell transfusion.  4. Hypertension.  5. Deconditioning.  6. Stage II sacral decubitus ulcer.  7. Diarrhea, resolved.  8. Hypokalemia, repleted.  9. Dysphagia on specialized diet.  10.Gout.  11.Dementia.   DISCHARGE MEDICATIONS:  To be determined by the discharging physician.   PROCEDURES:  A CT of the chest with contrast on Mar 26, 2009.  Impression:  A.  Decreased size of the superior segment left lower lobe cavitary  lesion but persistent air fluid/air debris level.  Appearance now favors  pulmonary abscess according to the radiologist.  B.  Right lower lobe pulmonary emboli re-identified.   PERTINENT LABS:  INR today 2.7.  CBC:  Hemoglobin 10.4, hematocrit 32, white blood cells  4.2, platelets 424.  Basic metabolic panel on Mar 23, 2009:  Unremarkable  with BUN 8, creatinine 0.83.  C. difficile toxin negative.  BNP 254.   CONSULTATION:  1. Infectious Disease, Dr. Ninetta Lights.  2. Pulmonary, Dr. Craige Cotta.   HOSPITAL COURSE AND PATIENT DISPOSITION:  1. Acute right lower lobe pulmonary embolism.  The patient had been on      full-dose Lovenox bridging and Coumadin.  Once his INR was      therapeutic greater for greater than 2 days and after completing 5      days overlap, Lovenox was discontinued.  The  patient is currently      anticoagulated on Coumadin.  However, if the patient is planned for      any procedures by the pulmonary team, then might have to holding      his Coumadin and switching him to intravenous heparin.  2. Left lower lobe cavitary pneumonia.  The patient had hacking      productive cough which progressively has improved.  The patient has      remained afebrile without leukocytosis.  His lung findings are also      improving.  The patient is on a day #10 of Zyvox and day 9 of      Zosyn.  Dr. Ninetta Lights has continued to follow the patient.  Based on      the repeat CT results, Dr. Ninetta Lights recommends a pulmonary      evaluation for bronchoscopy plus culture and/or CVTS evaluation for      resection.  The patient, however, has clinically done well over the  last week.  Given his advanced age and poor baseline physical      condition, he may not be a candidate for surgery.  I have consulted      Dr. Craige Cotta who will see the patient on Mar 28, 2009, morning.  3. Anemia where the patient's hemoglobin had dropped to 8.2 on the 5th      of May.  This is probably secondary to multiple phlebotomies in a      chronically anemic patient.  He was transfused 2 units of packed      red blood cells with appropriate response and has been stable      since.  4. Hypertension which is controlled.  5. Deconditioning where the patient will be for SNF when he is stable      for discharge.  6. Stage II sacral decubitus management per wound care team.  7. Dementia.  The patient, although is oriented in person and place,      at times seems to be confused.   At this time, the patient is not yet stable for discharge.  Also I have  called his daughter and updated his care on Mar 22, 2009 and will attempt  to do again today.      Marcellus Scott, MD  Electronically Signed     AH/MEDQ  D:  03/27/2009  T:  03/27/2009  Job:  [id number]   cc:   Lacretia Leigh. Ninetta Lights, M.D.  Fax: [phone]    Coralyn Helling, MD  [address]  [place], [place] [address]   Sharlot Gowda, M.D.  Fax: [phone]

## 2011-04-01 NOTE — Discharge Summary (Signed)
NAMEMarland Kitchen  Ricky, [name]               ACCOUNT NO.:  [id number]   MEDICAL RECORD NO.:  [id number]          PATIENT TYPE:  INP   LOCATION:  1529                         FACILITY:  [facility]   PHYSICIAN:  Peggye Pitt, M.D. DATE OF BIRTH:  1916-12-08   DATE OF ADMISSION:  03/12/2009  DATE OF DISCHARGE:  04/02/2009                               DISCHARGE SUMMARY   ADDENDUM:  Please note that this is an addendum to a discharge summary  previously dictated by Dr. Waymon Amato on Mar 27, 2009 and by Dr. Glade Lloyd on  Mar 20, 2009.  This summary will update on patient's progress since last  dictated discharge summary on May 11.   DISCHARGE DIAGNOSES:  Remain as follows:  1. Acute pulmonary embolism anticoagulated on Coumadin.  2. Left lower lobe cavitary pneumonia/lung abscess.  3. Anemia.  4. Hypertension.  5. Deconditioning.  6. Stage II sacral decubitus ulcer.  7. Dysphagia, on special diet.  8. Gout.  9. Dementia.   DISCHARGE MEDICATIONS:  1. Bactrim Double Strength 1 tablet twice daily until May 29.  2. Coumadin 1 mg daily; dose will need to be further adjusted      depending on INR checks.  3. Lisinopril 20 mg daily.  4. Fosamax 70 mg weekly.  5. Namenda 10 mg daily.  6. Norvasc 5 mg daily.  7. Omeprazole 20 mg daily.  8. Aricept 5 mg daily.  9. Ibuprofen 400 mg as needed for pain.  10.Indomethacin 6 mg daily as needed.   DISPOSITION AND FOLLOWUP:  Ricky [name] is discharged to his nursing  facility today.  Important issues for follow-up include at the  completion of his 2 weeks of antibiotics with Bactrim he will need a  repeat CT scan of the chest per recommendations by Dr. Ninetta Lights and Dr.  Craige Cotta to ensure that his pneumonia/abscess has resolved.  Also of note he  has been started on Bactrim today which will increase his INRs, hence  his Coumadin dosage may need to be decreased.  He was taking 2.5 to 5 mg  while in the hospital.  Per pharmacy recommendations we have decreased  the  dose to 1 mg daily in anticipation of the interaction with Bactrim.  I would suggest at least initially to get daily PT and INR checks and to  further adjust his Coumadin depending on these results.   HOSPITAL COURSE BY PROBLEM:  Since previously dictated discharge  summary:  1. For his lung abscess, he has now completed a course of Zyvox and      Zosyn as indicated by Infectious Disease.  He was seen in      consultation by Dr. Craige Cotta with Pulmonary Critical Care who stated      that a bronchoscopy was probably not needed at this time, given      patient's improved clinical status we should probably just complete      a course of antibiotics and see how he responded to it.  He has      responded fine, he has been afebrile and has not had any  leukocytosis.  Please note that he will need a repeat CT scan of      the chest with contrast upon completion of the two weeks of      Bactrim.  2. Pulmonary embolism.  He has been maintained on Coumadin.  His INR      on day of discharge is 2.6.  as stated above per pharmacy      recommendations we have decreased dose of Coumadin to 1 mg daily.  3. Rest of chronic medical problems have not been an issue since prior      dictated discharge summary.  4. Vital signs on day of discharge:  Blood pressure 144/94, heart rate      67, respirations 20, O2 sats 98% on room air with a temperature of      98.3.  5. Labs on day of discharge:  WBCs 3.7, hemoglobin 11.1 and a platelet      count of 311.  His INR is 2.6.      Peggye Pitt, M.D.  Electronically Signed     EH/MEDQ  D:  04/02/2009  T:  04/02/2009  Job:  [id number]   cc:   Lacretia Leigh. Ninetta Lights, M.D.  Fax: [phone]   Coralyn Helling, MD  [address]  [place], [place] [address]   Sharlot Gowda, M.D.  Fax: [phone]

## 2011-04-01 NOTE — Group Therapy Note (Signed)
NAME:  Ricky [name], Ricky [name] NO.:  [id number]   MEDICAL RECORD NO.:  [id number]          PATIENT TYPE:  INP   LOCATION:  1529                         FACILITY:  [facility]   PHYSICIAN:  Theodosia Paling, MD    DATE OF BIRTH:  [birth year]                                 PROGRESS NOTE   DATE OF DISCHARGE:  To be determined by discharging physician.   DISCHARGE DIAGNOSES:  1. Acute pulmonary embolism.  2. Acute necrotizing pneumonia.  3. Lethargy.  4. Hypokalemia.   SECONDARY DIAGNOSES:  1. History of dementia.  2. History of hypertension.  3. History of gout_.   DISCHARGE MEDICATIONS:  Will be determined by discharging physician.   HOSPITAL COURSE:  1. The following issues were addressed during the hospitalization.      Acute leukocytosis.  The patient had chest x-ray suggestive of left      upper lobe pneumonia and right lower lobe pneumonia.  The patient      was started on broad-spectrum antibiotics including vancomycin and      Zosyn.  However, the patient continued to have fever.  I requested      a sputum culture, which grew MRSA.  The patient, despite getting 6      days of IV vancomycin, was still spiking fever.  Therefore, I      switched over to IV Zyvox, due to its lung tissue based on      admission studies.  The patient has been on Zyvox for the last 24      hours and as the fever is beginning to go down, I consulted      infectious disease and a CT scan was requested to look for empyema.      The repeat chest x-ray was done to allow me to look for any      complicated pleural effusion.  However, CT scan was found to have      huge cavity consistent with necrotizing pneumonia secondary to MRSA      most likely.  Infectious disease has added back IV Zosyn and he      does not have any signs or symptoms of thrombocytopenia so far.      Will continue to monitor the blood count while he is on Zyvox, and      I highly appreciate the help of Dr. Ninetta Lights from  infectious disease      for assisting [place] in management of this patient.  2. Acute PE.  As mentioned above, the patient was diagnoses with acute      pulmonary embolism on steroid.  Is currently on Lovenox and      Coumadin.  Bilateral lower extremity duplex is pending at this      time.  3. Hypokalemia.  Today, the patient's blood work shows repleted.  4. Lethargy.  The patient is extremely weak.  He was recently      discharged from the skilled nursing facility.  A PT evaluation was      performed, which showed the patient was need to go back to the  skilled nursing facility when medically stable.  5. History of gout, dementia, and hypertension.  Those medical      conditions stayed stable.   DISPOSITION:  To be determined by discharging physician.  Overall, the patient has  poor prognosis.  I am going to talk to the daughter regarding the Code  Status.  Overall, the patient is still too unstable for discharge at  this time.  Total time spent in dictating this discharge summary, 45  minutes.   CONSULTATIONS:  Dr. Ninetta Lights of infectious disease, performed on Mar 20, 2009.   PROCEDURES PERFORMED:  None.   IMAGING PERFORMED:  As mentioned under hospital course.  In addition, there was a trial  evaluation performed on Mar 19, 2009, as the patient was found to have  dysphagia and is currently on modified diet.      Theodosia Paling, MD  Electronically Signed     NP/MEDQ  D:  03/20/2009  T:  03/20/2009  Job:  [id number]

## 2011-04-01 NOTE — H&P (Signed)
NAME:  Ricky [name], Ricky [name] NO.:  [id number]   MEDICAL RECORD NO.:  [id number]          PATIENT TYPE:  EMS   LOCATION:  MAJO                         FACILITY:  [facility]   PHYSICIAN:  Lucita Ferrara, MD         DATE OF BIRTH:  1917/08/16   DATE OF ADMISSION:  01/16/2009  DATE OF DISCHARGE:                              HISTORY & PHYSICAL   CHIEF COMPLAINT:  Syncope.   The patient is an 77 year old African American male with a past medical  history of hypertension, who presents with an episode of blacking out  while the patient was at home.  Per family, the patient was eating  dinner.  The patient did not have any prodrome such as chest pain or  shortness of breath.  There were no focal neurological deficits.  Per  son's report which was through EMS, the patient was found then later on  lying on the ground, seemed to be unconscious, one hand then started to  shake.  There was no foaming of the mouth, however, the patient was  incontinent of urine during the episode.  The patient denies a past  medical history of epilepsy or seizure disorder.  There was no postictal  period of confusion.  Otherwise 12-point review of systems is negative.  He denies any neck pain, fevers, chills, photophobia, focal neurological  deficits, chest pain or shortness of breath.   PAST MEDICAL HISTORY:  Hypertension.   SOCIAL HISTORY:  The patient denies drugs, alcohol or tobacco.  He lives  at home.   PRIMARY CARE DOCTOR:  Dr. Susann Givens.   PAST SURGICAL HISTORY:  None.   ALLERGIES:  NO KNOWN DRUG ALLERGIES.   MEDICATIONS:  1. Celebrex 200 mg p.o. b.i.d.  2. Lisinopril 10 mg p.o. daily.  3. Hydrochlorothiazide 12.5 mg p.o. once daily.  4. [place]ura XR 60 mg p.o. once daily.  5. Fosamax 70 mg p.o. daily   PHYSICAL EXAMINATION:  GENERAL:  The patient is in no acute distress.  VITAL SIGNS:  Blood pressure 141/81, pulse 64, respirations 15,  temperature 97.4.  Pulse ox 97% on room air.  HEENT:   The patient is normocephalic, atraumatic.  Sclerae anicteric.  NECK:  Supple.  No JVD, no carotid bruits.  CARDIOVASCULAR:  S1-S2.  Regular rate and rhythm.  No murmurs, rubs or  clicks.  LUNGS:  Clear to auscultation bilaterally.  No rhonchi, rales or  wheezes.  ABDOMEN:  Soft, nontender, nondistended.  Positive bowel sounds.  EXTREMITIES:  No clubbing, cyanosis or edema.  NEUROLOGIC:  Currently, the patient is alert and oriented x3.  Cranial  nerves II through XII are grossly intact.  Strength 5/5 bilateral upper  and lower extremities.   LABS AND RADIOLOGICAL RESULTS:  EKG shows sinus rhythm with premature  ventricular contractions.  Occult blood negative.  CT scan of the head;  there is chronic small vessel ischemic changes with atrophy.  There is  otherwise no changes.  Urinalysis negative.  Chest x-ray shows  cardiomegaly and aortic ectasia.  Troponin negative.  CBC shows a  hemoglobin of 11.7,  hematocrit 34.7, platelet count 156, otherwise  negative.   ASSESSMENT/PLAN:  The patient is an 77 year old with a syncopal episode  that occurred at home with some unusual shaking behavior and urinary  incontinence, rule out seizure disorder.   1. Syncope, rule out cardiogenic syncope, rule out seizure disorder.  2. Hypertension.   DISCUSSION AND PLAN:  The patient will be admitted to the medical  telemetry floor.  We will proceed with an MRI of the brain.  We will  proceed with an EEG, 2-D echocardiogram, bilateral carotid Dopplers.  We  will cycle cardiac enzymes x3 q.8 h.  DVT and GI prophylaxis with  Lovenox and Protonix.  We will continue hydrochlorothiazide and  lisinopril with holding parameters.  Neurology consultation as needed.      Lucita Ferrara, MD  Electronically Signed     RR/MEDQ  D:  01/16/2009  T:  01/16/2009  Job:  [id number]

## 2011-04-01 NOTE — Discharge Summary (Signed)
Ricky [name], Ricky [name]               ACCOUNT NO.:  [id number]   MEDICAL RECORD NO.:  [id number]          PATIENT TYPE:  INP   LOCATION:  4736                         FACILITY:  [facility]   PHYSICIAN:  Michelene Gardener, MD    DATE OF BIRTH:  [birth year]   DATE OF ADMISSION:  01/16/2009  DATE OF DISCHARGE:  01/19/2009                               DISCHARGE SUMMARY   DISCHARGE DIAGNOSES:  1. Vasovagal syncope.  2. Hypertension.  3. Osteoporosis.  4. Possible gout.  5. Frequent falls.   DISCHARGE MEDICATIONS:  1. Celebrex 200 mg p.o. twice daily.  2. Lisinopril 20 mg once a day.  3. Norvasc 5 mg once a day.  4. Fosamax 70 mg once a day.  5. Indomethacin 50 mg q.8 hours as needed.   Medications to stop include:  Hydrochlorothiazide.   CONSULTATIONS:  None.   PROCEDURES:  None.   RADIOLOGY STUDIES:  1. Chest x-ray on March 2 showed cardiomegaly without acute problem.  2. CT scan of the head without contrast on March 2 showed chronic      small vessel disease.  3. MRI of the brain without contrast on March 3 showed no acute      problem and showed chronic small vessel disease.  4. MRA of the brain showed normal findings.  5. Echocardiogram on March 3 showed ejection fraction of 60% with no      left wall motion abnormalities.   FOLLOW UP:  With primary doctor within 1 week.   COURSE OF HOSPITALIZATION:  This is an 77 year old African American male  who was brought to the hospital with a brief episode of syncope.  The  patient was admitted to telemetry.  Telemetry has been monitored during  his hospitalization and showed no evidence of arrhythmia.  CT scan of  the head was done and showed no evidence of acute problem.  MRI of the  brain was done and showed no acute problem.  MRA of the brain showed no  evidence of blockage.  Echocardiogram showed normal ejection fraction  without evidence of embolic source of any possible stroke.  Patient  remained stable in the hospital without  any issues.  Screen for  swallowing was done and patient passed.  Physical therapy evaluation was  done, recommended a skilled nursing facility.  Social worker was  consulted and patient will be discharged to skilled nursing facility  when bed is available.   The patient had swollen big toe which is classic for gout and has been  started on indomethacin that helped.  I discontinued his  hydrochlorothiazide because it contributed to some cases of gout.  I  switched his blood pressure medicine to Norvasc 5 mg and I increased his  lisinopril to 20 mg.  Otherwise other medical conditions remained  stable.   Total assessment time is 40 minutes.      Michelene Gardener, MD  Electronically Signed     NAE/MEDQ  D:  01/19/2009  T:  01/19/2009  Job:  [id number]

## 2011-04-04 NOTE — Assessment & Plan Note (Signed)
[facility]                           GASTROENTEROLOGY OFFICE NOTE   NAME:Freestone, SHAIDEN ALDOUS                      MRN:          [id number]  DATE:07/15/2006                            DOB:          11/06/1917    PROBLEMS:  1. Intermittent fecal incontinence.  2. History of irritable bowel syndrome.  3. Gastroesophageal reflux disease.   HISTORY:  Mr. Croft is a delightful 77 year old African-American male known  to Dr. Victorino Dike, who was last seen approximately 2 years ago.  He has  relatively long-term symptoms with intermittent fecal incontinence dating  back several years.  It was noted on sigmoidoscopy in 1997 that he had been  having some intermittent incontinence and soiling in his underclothes.  He  was noted to have a significant decrease in anal sphincter tone and negative  sigmoidoscopy.  He had a colonoscopy done in 1994 showing a very tortuous  and redundant colon.  He had one diminutive sigmoid colon polyp and was  noted to have some tiny rectal erosions, which were biopsied.  These were  unremarkable.   The patient is referred per Dr. Susann Givens at this time as he had seen him  recently and had mentioned the soiling.  He had labs done there showing a  hemoglobin of 12.5, hematocrit of 39.4, WBC of 3.8 and platelets 161.  The  patient himself said he has not been bothered much recently with the  incontinence and that this happens infrequently.  He is not wearing Depends  nor does he usually need to.  He is not using any fiber supplements  currently.  He says he tries to eat healthy and generally has fairly normal  bowel movements.  He has not noted any melena or hematochezia, has no  complaints of rectal pain or discomfort.  His appetite has been fair.  His  weight is down about 4 pounds from a couple of years ago.  He has no  complaints of nausea, vomiting, heartburn or indigestion.  He does menton  that he exercises regularly with  walking and even plays a little bit of  basketball.   CURRENT MEDICATIONS:  1. Detrol LA 4 mg daily.  2. Altace 2.5 mg daily.   ALLERGIES:  No known drug allergies.   FAMILY HISTORY:  Pertinent for some sort of intra-abdominal cancer in his  mother, he is not certain.   PHYSICAL EXAMINATION:  GENERAL:  A well-developed, thin African-American  male in no acute distress.  VITAL SIGNS:  Weight is 151.  Blood pressure 130/60, pulse of 56.  CARDIOVASCULAR:  Regular rate and rhythm with S1 and S2.  PULMONARY:  Clear to A&P.  ABDOMEN:  Soft and nontender.  Bowel sounds are active.  There is no  palpable mass or hepatosplenomegaly.  RECTAL:  Marked decreased anal sphincter tone.  There is no mass.  Stool is  brown and heme-negative.   IMPRESSION:  1. An 77 year old male with occasional fecal incontinence due to lax anal      sphincter.  2. Gastroesophageal reflux disease.   PLAN:  1. Unfortunately, at his age  there is not any specific therapy recommended      for intermittent incontinence.  It sounds like this is really a minor      problem currently.  Would not plan to pursue a colonoscopic evaluation      at this time, and he was reassured.  2. He will return on a p.r.n. basis.                                   Mike Gip, PA-C                                Ulyess Mort, MD   AE/MedQ  DD:  07/16/2006  DT:  07/17/2006  Job #:  [id number]

## 2011-04-23 ENCOUNTER — Other Ambulatory Visit: Payer: Self-pay | Admitting: Family Medicine

## 2011-04-23 ENCOUNTER — Other Ambulatory Visit: Payer: BC Managed Care – PPO

## 2011-06-02 ENCOUNTER — Other Ambulatory Visit: Payer: BC Managed Care – PPO

## 2011-06-02 DIAGNOSIS — Z7901 Long term (current) use of anticoagulants: Secondary | ICD-10-CM

## 2011-06-02 LAB — PROTIME-INR
INR: 2.98 — ABNORMAL HIGH (ref <1.50)
Prothrombin Time: 31.2 seconds — ABNORMAL HIGH (ref 11.6–15.2)

## 2011-06-03 ENCOUNTER — Telehealth: Payer: Self-pay

## 2011-06-03 NOTE — Telephone Encounter (Signed)
Called pt talked with Ricky [name] gave her information on lab

## 2011-06-24 ENCOUNTER — Other Ambulatory Visit: Payer: Self-pay | Admitting: Family Medicine

## 2011-06-24 NOTE — Telephone Encounter (Signed)
Is this ok?

## 2011-06-24 NOTE — Telephone Encounter (Signed)
Dr Susann Givens took care of refill

## 2011-07-02 ENCOUNTER — Other Ambulatory Visit: Payer: Self-pay | Admitting: Family Medicine

## 2011-07-25 ENCOUNTER — Other Ambulatory Visit: Payer: Self-pay | Admitting: Family Medicine

## 2011-08-26 ENCOUNTER — Encounter: Payer: Self-pay | Admitting: Family Medicine

## 2011-08-27 ENCOUNTER — Ambulatory Visit (INDEPENDENT_AMBULATORY_CARE_PROVIDER_SITE_OTHER): Payer: BC Managed Care – PPO | Admitting: Family Medicine

## 2011-08-27 ENCOUNTER — Encounter: Payer: Self-pay | Admitting: Family Medicine

## 2011-08-27 VITALS — BP 110/70 | HR 60 | Wt 176.0 lb

## 2011-08-27 DIAGNOSIS — Z23 Encounter for immunization: Secondary | ICD-10-CM

## 2011-08-27 DIAGNOSIS — Z7901 Long term (current) use of anticoagulants: Secondary | ICD-10-CM

## 2011-08-27 DIAGNOSIS — Z86711 Personal history of pulmonary embolism: Secondary | ICD-10-CM

## 2011-08-27 DIAGNOSIS — K219 Gastro-esophageal reflux disease without esophagitis: Secondary | ICD-10-CM

## 2011-08-27 LAB — PROTIME-INR
INR: 2.27 — ABNORMAL HIGH (ref <1.50)
Prothrombin Time: 25.2 seconds — ABNORMAL HIGH (ref 11.6–15.2)

## 2011-08-27 NOTE — Patient Instructions (Signed)
Take on Prilosec at night and if this is not successful, take 2 of them at night. Call me in 1 or 2 weeks.

## 2011-08-27 NOTE — Progress Notes (Signed)
  Subjective:    Patient ID: Ricky [name], male    DOB: 1917-03-27, 77 y.o.   MRN: [id number]  HPI He is here for evaluation of a cough that occurs mainly at night. There is no sore throat, nasal congestion, fever or chills, lack of productive cough. He continues on medications listed in the chart. He did have a few episodes of indigestion.   Review of Systems     Objective:   Physical Exam alert and in no distress. Tympanic membranes and canals are normal. Throat is clear. Tonsils are normal. Neck is supple without adenopathy or thyromegaly. Cardiac exam shows a regular sinus rhythm without murmurs or gallops. Lungs are clear to auscultation. Abdominal exam shows no masses or tenderness.        Assessment & Plan:   1. Encounter for long-term (current) use of anticoagulants  INR/PT  2. History of pulmonary embolus (PE)    3. GERD (gastroesophageal reflux disease)     PT/INR was done and is normal. He will continue his present dosing. He is to use Prilosec one or 2 pills for the next week. He will then call and let me know how he is doing.

## 2011-09-11 ENCOUNTER — Other Ambulatory Visit: Payer: Self-pay | Admitting: Family Medicine

## 2011-09-15 ENCOUNTER — Telehealth: Payer: Self-pay | Admitting: Family Medicine

## 2011-09-18 ENCOUNTER — Ambulatory Visit (INDEPENDENT_AMBULATORY_CARE_PROVIDER_SITE_OTHER): Payer: Medicare PPO | Admitting: Family Medicine

## 2011-09-18 VITALS — BP 110/72 | HR 58 | Wt 178.0 lb

## 2011-09-18 DIAGNOSIS — J309 Allergic rhinitis, unspecified: Secondary | ICD-10-CM

## 2011-09-18 DIAGNOSIS — K623 Rectal prolapse: Secondary | ICD-10-CM | POA: Insufficient documentation

## 2011-09-18 NOTE — Patient Instructions (Signed)
Use Claritin which is loratadine and see what that'll do to help with the nose and the drainage. He'll discuss his rectal issue with Dr. Elnoria Howard. Increase Prilosec to 2 pills per day

## 2011-09-18 NOTE — Progress Notes (Signed)
  Subjective:    Patient ID: Ricky [name], male    DOB: Dec 19, 1916, 77 y.o.   MRN: [id number]  HPI He is here for reevaluation of coughing. The Prilosec did initially help but now he is having difficulty with constant coughing especially during the day. He also has rhinorrhea as well as PND but no fever, chills, sore throat or earache. He also has had one episode of blood in his stool. This has occurred on a few other occasions. His son did mention the fact that Dr. hung has evaluated this in the past.  Review of Systems     Objective:   Physical Exam alert and in no distress. Tympanic membranes and canals are normal. Throat is clear. Tonsils are normal. Neck is supple without adenopathy or thyromegaly. Cardiac exam shows a regular sinus rhythm without murmurs or gallops. Lungs are clear to auscultation. Exam of the rectum does show prolapse of the rectum with no sphincter tone.       Assessment & Plan:   1. Rectal prolapse   2. Allergic rhinitis    I will have him use Claritin to help with the drainage. He will continue on Prilosec at 40 mg per day. The rectal prolapse was discussed with Dr. Loreta Ave. Her recommendation that since he is not having a great deal of difficulty, leave him alone.

## 2011-09-19 ENCOUNTER — Inpatient Hospital Stay (INDEPENDENT_AMBULATORY_CARE_PROVIDER_SITE_OTHER)
Admission: RE | Admit: 2011-09-19 | Discharge: 2011-09-19 | Disposition: A | Discharge status: Home / Self Care | Payer: Medicare PPO | Source: Ambulatory Visit | Attending: Emergency Medicine | Admitting: Emergency Medicine

## 2011-09-19 ENCOUNTER — Telehealth: Payer: Self-pay | Admitting: Family Medicine

## 2011-09-19 DIAGNOSIS — IMO0002 Reserved for concepts with insufficient information to code with codable children: Secondary | ICD-10-CM

## 2011-09-19 DIAGNOSIS — W2209XA Striking against other stationary object, initial encounter: Secondary | ICD-10-CM

## 2011-09-22 NOTE — Telephone Encounter (Signed)
1 WK OLD. CLOSE ENCOUNTER

## 2011-09-29 NOTE — Telephone Encounter (Signed)
08/29/2011 °

## 2011-10-02 ENCOUNTER — Encounter: Payer: Self-pay | Admitting: Family Medicine

## 2011-10-02 ENCOUNTER — Ambulatory Visit (INDEPENDENT_AMBULATORY_CARE_PROVIDER_SITE_OTHER): Payer: Medicare PPO | Admitting: Family Medicine

## 2011-10-02 VITALS — BP 120/76 | HR 62 | Wt 179.0 lb

## 2011-10-02 DIAGNOSIS — N390 Urinary tract infection, site not specified: Secondary | ICD-10-CM

## 2011-10-02 DIAGNOSIS — R319 Hematuria, unspecified: Secondary | ICD-10-CM

## 2011-10-02 DIAGNOSIS — Z7901 Long term (current) use of anticoagulants: Secondary | ICD-10-CM

## 2011-10-02 LAB — POCT URINALYSIS DIPSTICK
Leukocytes, UA: NEGATIVE
Nitrite, UA: NEGATIVE
Urobilinogen, UA: NEGATIVE
pH, UA: 5

## 2011-10-02 LAB — CBC WITH DIFFERENTIAL/PLATELET
Lymphocytes Relative: 25 % (ref 12–46)
Lymphs Abs: 1.2 10*3/uL (ref 0.7–4.0)
Neutrophils Relative %: 67 % (ref 43–77)
Platelets: 191 10*3/uL (ref 150–400)
RBC: 4.38 MIL/uL (ref 4.22–5.81)
WBC: 5 10*3/uL (ref 4.0–10.5)

## 2011-10-02 LAB — PROTIME-INR
INR: 3.28 — ABNORMAL HIGH (ref <1.50)
Prothrombin Time: 33.7 seconds — ABNORMAL HIGH (ref 11.6–15.2)

## 2011-10-02 MED ORDER — CIPROFLOXACIN HCL 500 MG PO TABS: 500.0000 mg | ORAL_TABLET | Freq: Two times a day (BID) | ORAL | Status: AC

## 2011-10-02 NOTE — Progress Notes (Signed)
  Subjective:    Patient ID: Ricky [name], male    DOB: Jul 02, 1917, 77 y.o.   MRN: [id number]  HPI He is here for multiple concerns. He has continued to have difficulty with coughing he had been on Sanger S. medication to help with this. The med that helped the most was Mucinex DM. His coughing is 90% better. He also has blood in his urine. He continues on his Coumadin. No bleeding from his gums, nose, stool. He does complain of a four-day history of low back pain   Review of Systems     Objective:   Physical Exam Exam of the urine shows it is grossly bloody. Abdominal exam shows no masses or tenderness. Stat PT/INR was therapeutic. CBC was normal.       Assessment & Plan:  Hematuria; probable UTI I will get a culture, place him on Cipro and followup here in roughly 10 days unless his bleeding gets worse. I will then refer him to urology.

## 2011-10-02 NOTE — Patient Instructions (Signed)
We will call you with the results of the blood work later today

## 2011-10-03 ENCOUNTER — Telehealth: Payer: Self-pay | Admitting: Family Medicine

## 2011-10-03 NOTE — Telephone Encounter (Signed)
The antibiotic should help with that also

## 2011-10-03 NOTE — Telephone Encounter (Signed)
Notified Lisa(child) that cipro should help with that. If not better soon to call back

## 2011-10-13 ENCOUNTER — Ambulatory Visit (INDEPENDENT_AMBULATORY_CARE_PROVIDER_SITE_OTHER): Payer: Medicare PPO | Admitting: Internal Medicine

## 2011-10-13 DIAGNOSIS — Z87448 Personal history of other diseases of urinary system: Secondary | ICD-10-CM

## 2011-10-13 DIAGNOSIS — Z7901 Long term (current) use of anticoagulants: Secondary | ICD-10-CM

## 2011-10-13 LAB — POCT URINALYSIS DIPSTICK
Blood, UA: NEGATIVE
Glucose, UA: NEGATIVE
Nitrite, UA: NEGATIVE
Urobilinogen, UA: NEGATIVE

## 2011-10-13 LAB — PROTIME-INR
INR: 2.82 — ABNORMAL HIGH (ref <1.50)
Prothrombin Time: 29.9 seconds — ABNORMAL HIGH (ref 11.6–15.2)

## 2011-10-14 ENCOUNTER — Ambulatory Visit (INDEPENDENT_AMBULATORY_CARE_PROVIDER_SITE_OTHER): Payer: Medicare PPO | Admitting: Family Medicine

## 2011-10-14 ENCOUNTER — Encounter: Payer: Self-pay | Admitting: Family Medicine

## 2011-10-14 VITALS — BP 140/90 | HR 62 | Wt 181.0 lb

## 2011-10-14 DIAGNOSIS — K219 Gastro-esophageal reflux disease without esophagitis: Secondary | ICD-10-CM

## 2011-10-14 DIAGNOSIS — J309 Allergic rhinitis, unspecified: Secondary | ICD-10-CM

## 2011-10-14 NOTE — Patient Instructions (Signed)
Stay on Claritin and Prilosec and even elevate the head of the bed a little bit to see if that'll help

## 2011-10-14 NOTE — Progress Notes (Deleted)
Pt gave a sample of urine and had PT/INR blood work done

## 2011-10-14 NOTE — Progress Notes (Signed)
  Subjective:    Patient ID: Ricky [name], male    DOB: May 12, 1917, 77 y.o.   MRN: [id number]  HPI He is here for evaluation of continued difficulty with postnasal drainage and cough. He has a previous history of difficulty with this and has responded to PPI as well as antihistamines. Most recently he was treated with Cipro for treatment of a UTI with hematuria. Repeat urinalysis after the Cipro showed clearing of the blood. Presently he is mainly clearing his throat but no cough, congestion.   Review of Systems     Objective:   Physical Exam alert and in no distress. Tympanic membranes and canals are normal. Throat is clear. Tonsils are normal. Neck is supple without adenopathy or thyromegaly. Cardiac exam shows a regular sinus rhythm without murmurs or gallops. Lungs are clear to auscultation.        Assessment & Plan:  Recent UTI. GERD. Allergic rhinitis. Continue with Allegra, Prilosec. Also elevate the head of the bed. Return here as needed.

## 2011-10-15 NOTE — Progress Notes (Signed)
dt ?

## 2011-11-12 ENCOUNTER — Emergency Department (HOSPITAL_COMMUNITY): Payer: Medicare PPO

## 2011-11-12 ENCOUNTER — Other Ambulatory Visit: Payer: Self-pay

## 2011-11-12 ENCOUNTER — Inpatient Hospital Stay (HOSPITAL_COMMUNITY)
Admission: EM | Admit: 2011-11-12 | Discharge: 2011-11-15 | DRG: 312 | Disposition: A | Discharge status: Home / Self Care | Payer: Medicare PPO | Attending: Internal Medicine | Admitting: Internal Medicine

## 2011-11-12 ENCOUNTER — Encounter (HOSPITAL_COMMUNITY): Payer: Self-pay

## 2011-11-12 ENCOUNTER — Telehealth: Payer: Self-pay | Admitting: Family Medicine

## 2011-11-12 DIAGNOSIS — J189 Pneumonia, unspecified organism: Secondary | ICD-10-CM | POA: Diagnosis present

## 2011-11-12 DIAGNOSIS — K5730 Diverticulosis of large intestine without perforation or abscess without bleeding: Secondary | ICD-10-CM | POA: Diagnosis present

## 2011-11-12 DIAGNOSIS — Z7982 Long term (current) use of aspirin: Secondary | ICD-10-CM

## 2011-11-12 DIAGNOSIS — I498 Other specified cardiac arrhythmias: Secondary | ICD-10-CM | POA: Diagnosis present

## 2011-11-12 DIAGNOSIS — K589 Irritable bowel syndrome without diarrhea: Secondary | ICD-10-CM | POA: Diagnosis present

## 2011-11-12 DIAGNOSIS — I517 Cardiomegaly: Secondary | ICD-10-CM

## 2011-11-12 DIAGNOSIS — R55 Syncope and collapse: Principal | ICD-10-CM | POA: Diagnosis present

## 2011-11-12 DIAGNOSIS — M810 Age-related osteoporosis without current pathological fracture: Secondary | ICD-10-CM | POA: Diagnosis present

## 2011-11-12 DIAGNOSIS — Z86711 Personal history of pulmonary embolism: Secondary | ICD-10-CM

## 2011-11-12 DIAGNOSIS — F0280 Dementia in other diseases classified elsewhere without behavioral disturbance: Secondary | ICD-10-CM | POA: Diagnosis present

## 2011-11-12 DIAGNOSIS — Z79899 Other long term (current) drug therapy: Secondary | ICD-10-CM

## 2011-11-12 DIAGNOSIS — E039 Hypothyroidism, unspecified: Secondary | ICD-10-CM | POA: Diagnosis present

## 2011-11-12 DIAGNOSIS — H3530 Unspecified macular degeneration: Secondary | ICD-10-CM | POA: Diagnosis present

## 2011-11-12 DIAGNOSIS — I10 Essential (primary) hypertension: Secondary | ICD-10-CM | POA: Diagnosis present

## 2011-11-12 DIAGNOSIS — G309 Alzheimer's disease, unspecified: Secondary | ICD-10-CM | POA: Diagnosis present

## 2011-11-12 HISTORY — DX: Parkinson's disease: G20

## 2011-11-12 HISTORY — DX: Parkinsonism, unspecified: G20.C

## 2011-11-12 LAB — CBC
HCT: 37.4 % — ABNORMAL LOW (ref 39.0–52.0)
Hemoglobin: 12 g/dL — ABNORMAL LOW (ref 13.0–17.0)
MCH: 28.4 pg (ref 26.0–34.0)
MCV: 88.4 fL (ref 78.0–100.0)
Platelets: 170 10*3/uL (ref 150–400)
RBC: 4.23 MIL/uL (ref 4.22–5.81)
WBC: 7.3 10*3/uL (ref 4.0–10.5)

## 2011-11-12 LAB — COMPREHENSIVE METABOLIC PANEL
ALT: 14 U/L (ref 0–53)
AST: 21 U/L (ref 0–37)
Alkaline Phosphatase: 82 U/L (ref 39–117)
CO2: 25 mEq/L (ref 19–32)
Calcium: 9.7 mg/dL (ref 8.4–10.5)
GFR calc non Af Amer: 57 mL/min — ABNORMAL LOW (ref >90)
Potassium: 4.2 mEq/L (ref 3.5–5.1)
Sodium: 140 mEq/L (ref 135–145)

## 2011-11-12 LAB — URINALYSIS, ROUTINE W REFLEX MICROSCOPIC
Glucose, UA: NEGATIVE mg/dL
Hgb urine dipstick: NEGATIVE
Ketones, ur: NEGATIVE mg/dL
Leukocytes, UA: NEGATIVE
pH: 7 (ref 5.0–8.0)

## 2011-11-12 LAB — DIFFERENTIAL
Eosinophils Absolute: 0.1 10*3/uL (ref 0.0–0.7)
Eosinophils Relative: 1 % (ref 0–5)
Lymphocytes Relative: 17 % (ref 12–46)
Lymphs Abs: 1.3 10*3/uL (ref 0.7–4.0)
Monocytes Absolute: 0.6 10*3/uL (ref 0.1–1.0)
Monocytes Relative: 8 % (ref 3–12)

## 2011-11-12 LAB — RAPID HIV SCREEN (WH-MAU): Rapid HIV Screen: NONREACTIVE

## 2011-11-12 LAB — CARDIAC PANEL(CRET KIN+CKTOT+MB+TROPI)
Relative Index: 2.5 (ref 0.0–2.5)
Total CK: 127 U/L (ref 7–232)
Troponin I: <0.3 ng/mL (ref <0.30)

## 2011-11-12 LAB — TSH: TSH: 0.648 u[IU]/mL (ref 0.350–4.500)

## 2011-11-12 LAB — PROTIME-INR: INR: 3.46 — ABNORMAL HIGH (ref 0.00–1.49)

## 2011-11-12 LAB — VITAMIN B12: Vitamin B-12: 902 pg/mL (ref 211–911)

## 2011-11-12 MED ORDER — SODIUM CHLORIDE 0.9 % IV SOLN
INTRAVENOUS | Status: AC
  Administered 2011-11-12: 11:00:00 via INTRAVENOUS

## 2011-11-12 MED ORDER — SODIUM CHLORIDE 0.9 % IV SOLN
Freq: Once | INTRAVENOUS | Status: AC
  Administered 2011-11-12: 09:00:00 via INTRAVENOUS

## 2011-11-12 MED ORDER — CARBIDOPA-LEVODOPA 25-100 MG PO TABS
1.0000 | ORAL_TABLET | Freq: Three times a day (TID) | ORAL | Status: DC
  Administered 2011-11-12 – 2011-11-15 (×10): 1 via ORAL
  Filled 2011-11-12 (×11): qty 1

## 2011-11-12 MED ORDER — LEVOTHYROXINE SODIUM 25 MCG PO TABS
25.0000 ug | ORAL_TABLET | Freq: Every day | ORAL | Status: DC
  Administered 2011-11-12 – 2011-11-15 (×4): 25 ug via ORAL
  Filled 2011-11-12 (×4): qty 1

## 2011-11-12 MED ORDER — DEXTROSE 5 % IV SOLN
500.0000 mg | INTRAVENOUS | Status: DC
  Administered 2011-11-12 – 2011-11-13 (×2): 500 mg via INTRAVENOUS
  Filled 2011-11-12 (×3): qty 500

## 2011-11-12 MED ORDER — MEMANTINE HCL 10 MG PO TABS
10.0000 mg | ORAL_TABLET | Freq: Every evening | ORAL | Status: DC
  Administered 2011-11-12 – 2011-11-15 (×4): 10 mg via ORAL
  Filled 2011-11-12 (×4): qty 1

## 2011-11-12 MED ORDER — WARFARIN SODIUM 7.5 MG PO TABS: 7.5000 mg | ORAL_TABLET | Freq: Every day | ORAL | Status: DC

## 2011-11-12 MED ORDER — DONEPEZIL HCL 10 MG PO TABS
10.0000 mg | ORAL_TABLET | Freq: Every day | ORAL | Status: DC
  Administered 2011-11-12 – 2011-11-14 (×3): 10 mg via ORAL
  Filled 2011-11-12 (×4): qty 1

## 2011-11-12 MED ORDER — DEXTROSE 5 % IV SOLN: 500.0000 mg | INTRAVENOUS | Status: DC

## 2011-11-12 MED ORDER — ALENDRONATE SODIUM 70 MG PO TABS: 70.0000 mg | ORAL_TABLET | ORAL | Status: DC

## 2011-11-12 MED ORDER — CEFTRIAXONE SODIUM 1 G IJ SOLR
1.0000 g | INTRAMUSCULAR | Status: DC
  Administered 2011-11-12: 1 g via INTRAVENOUS
  Filled 2011-11-12: qty 10

## 2011-11-12 MED ORDER — DEXTROSE 5 % IV SOLN
1.0000 g | INTRAVENOUS | Status: DC
  Administered 2011-11-13 – 2011-11-14 (×2): 1 g via INTRAVENOUS
  Filled 2011-11-12 (×2): qty 10

## 2011-11-12 MED ORDER — LISINOPRIL 20 MG PO TABS
20.0000 mg | ORAL_TABLET | Freq: Every day | ORAL | Status: DC
  Administered 2011-11-12 – 2011-11-15 (×4): 20 mg via ORAL
  Filled 2011-11-12 (×4): qty 1

## 2011-11-12 NOTE — ED Notes (Signed)
Patient transported to X-ray 

## 2011-11-12 NOTE — Progress Notes (Signed)
*  PRELIMINARY RESULTS* Echocardiogram 2D Echocardiogram has been performed.  Glean Salen [facility] 11/12/2011, 3:33 PM

## 2011-11-12 NOTE — ED Notes (Signed)
Pt arrived via EMS, per EMS pt had a near syncope episode at home while sitting on the comode, per family this has happened multiple times and has been seen for same. No dx

## 2011-11-12 NOTE — ED Provider Notes (Signed)
History     CSN: [id number]  Arrival date & time 11/12/11  6578   First MD Initiated Contact with Patient 11/12/11 [phone]      Chief Complaint  Patient presents with  . Near Syncope    (Consider location/radiation/quality/duration/timing/severity/associated sxs/prior treatment) The history is provided by the patient. The history is limited by the condition of the patient.   patient here after having a near syncopal event at home. Patient has had multiple episodes of these occurrences. Patient was sitting on the commode and was passed out. Patient did not have any chest pain shortness of breath abdominal pain prior to the event. No black or bloody stools noted. Patient denies any  peripheral weakness or severe headaches. He feels at his baseline at this time. Nothing makes the symptoms better or worse. No treatment done prior to arrival  Past Medical History  Diagnosis Date  . IBS (irritable bowel syndrome)   . Diverticulosis   . Cataract   . OAB (overactive bladder)   . Macular degeneration   . Alzheimer disease   . Hypertension   . Arthritis   . Osteoporosis   . Parkinsonian syndrome     Past Surgical History  Procedure Date  . Esophagogastroduodenoscopy   . Eye surgery     LEFT CATARACT    Family History  Problem Relation Age of Onset  . Cancer Mother   . Stroke Father   . Liver disease Father   . Kidney disease Brother     History  Substance Use Topics  . Smoking status: Never Smoker   . Smokeless tobacco: Never Used  . Alcohol Use: Not on file      Review of Systems  Unable to perform ROS   Allergies  Review of patient's allergies indicates no known allergies.  Home Medications   Current Outpatient Rx  Name Route Sig Dispense Refill  . ALENDRONATE SODIUM 70 MG PO TABS  TAKE 1 TABLET ONCE A WEEK ON THURSDAYS 4 tablet PRN  . CARBIDOPA-LEVODOPA 10-100 MG PO TABS Oral Take 1 tablet by mouth 2 (two) times daily.     . DONEPEZIL HCL 10 MG PO TABS   TAKE ONE TABLET BY MOUTH DAILY 30 tablet PRN  . HYDRALAZINE HCL 10 MG PO TABS  TAKE ONE TABLET BY MOUTH THREE TIMES DAILY 90 tablet PRN  . LISINOPRIL 20 MG PO TABS Oral Take 20 mg by mouth daily.      Marland Kitchen NAMENDA 10 MG PO TABS  TAKE ONE TABLET BY MOUTH DAILY IN THE EVENING 30 each 5  . WARFARIN SODIUM 7.5 MG PO TABS  TAKE ONE TABLET BY MOUTH DAILY 30 tablet 1    BP 155/76  Pulse 69  Temp(Src) 98.1 F (36.7 C) (Oral)  Resp 18  Ht 6\' 1"  (1.854 m)  SpO2 92%  Physical Exam  Nursing note and vitals reviewed. Constitutional: He appears well-developed and well-nourished.  Non-toxic appearance. No distress.  HENT:  Head: Normocephalic and atraumatic.  Eyes: Conjunctivae, EOM and lids are normal. Pupils are equal, round, and reactive to light.  Neck: Normal range of motion. Neck supple. No tracheal deviation present. No mass present.  Cardiovascular: Normal rate, regular rhythm and normal heart sounds.  Exam reveals no gallop.   No murmur heard. Pulmonary/Chest: Effort normal and breath sounds normal. No stridor. No respiratory distress. He has no decreased breath sounds. He has no wheezes. He has no rhonchi. He has no rales.  Abdominal: Soft. Normal appearance and  bowel sounds are normal. He exhibits no distension. There is no tenderness. There is no rebound and no CVA tenderness.  Musculoskeletal: Normal range of motion. He exhibits no edema and no tenderness.  Neurological: He is alert. He displays tremor. No cranial nerve deficit or sensory deficit. GCS eye subscore is 4. GCS verbal subscore is 5. GCS motor subscore is 6.  Skin: Skin is warm and dry. No abrasion and no rash noted.  Psychiatric: His speech is normal. His affect is blunt. Thought content is not delusional.    ED Course  Procedures (including critical care time)   Labs Reviewed  CBC  DIFFERENTIAL  CARDIAC PANEL(CRET KIN+CKTOT+MB+TROPI)  COMPREHENSIVE METABOLIC PANEL  PROTIME-INR   No results found.   No diagnosis  found.    MDM   Date: 11/12/2011  Rate: 68  Rhythm: normal sinus rhythm  QRS Axis: normal  Intervals: normal  ST/T Wave abnormalities: nonspecific ST/T changes  Conduction Disutrbances:right bundle branch block  Narrative Interpretation:   Old EKG Reviewed: unchanged  11:06 AM Patient started on IV antibiotics, spoke with hospitalist, patient to be admitted        Toy Baker, MD 11/12/11 [phone]

## 2011-11-12 NOTE — Progress Notes (Addendum)
ANTICOAGULATION CONSULT NOTE - Initial Consult  Pharmacy Consult for Coumadin Indication: hx PE  No Known Allergies  Patient Measurements: Height: 6\' 1"  (185.4 cm) Weight: 173 lb 8 oz (78.7 kg) (bedscale) IBW/kg (Calculated) : 79.9  Adjusted Body Weight:   Vital Signs: Temp: 99.9 F (37.7 C) (12/26 1328) Temp src: Oral (12/26 1328) BP: 159/95 mmHg (12/26 1328) Pulse Rate: 72  (12/26 1328)  Labs:  Basename 11/12/11 0841 11/12/11 0839  HGB -- 12.0*  HCT -- 37.4*  PLT -- 170  APTT -- --  LABPROT -- 40.1*  INR -- 4.07*  HEPARINUNFRC -- --  CREATININE -- 1.08  CKTOTAL 127 --  CKMB 3.2 --  TROPONINI <0.30 --   Estimated Creatinine Clearance: 48.6 ml/min (by C-G formula based on Cr of 1.08).  Medical History: Past Medical History  Diagnosis Date  . IBS (irritable bowel syndrome)   . Diverticulosis   . Cataract   . OAB (overactive bladder)   . Macular degeneration   . Alzheimer disease   . Hypertension   . Arthritis   . Osteoporosis   . Parkinsonian syndrome     Medications:  Prescriptions prior to admission  Medication Sig Dispense Refill  . alendronate (FOSAMAX) 70 MG tablet Take 70 mg by mouth every 7 (seven) days. Takes on Thursdays. Take with a full glass of water on an empty stomach.       . carbidopa-levodopa (SINEMET) 25-100 MG per tablet Take 1 tablet by mouth 3 (three) times daily.        Marland Kitchen donepezil (ARICEPT) 10 MG tablet Take 10 mg by mouth at bedtime.        . hydrALAZINE (APRESOLINE) 10 MG tablet Take 10 mg by mouth 3 (three) times daily.        Marland Kitchen levothyroxine (SYNTHROID, LEVOTHROID) 25 MCG tablet Take 25 mcg by mouth daily.        Marland Kitchen lisinopril (PRINIVIL,ZESTRIL) 20 MG tablet Take 20 mg by mouth daily.        . memantine (NAMENDA) 10 MG tablet Take 10 mg by mouth every evening.        . warfarin (COUMADIN) 7.5 MG tablet Take 7.5 mg by mouth daily.          Assessment: 89yom on chronic Coumadin for hx PE.  Admission INR (4.07) is  supratherapeutic. Spoke with Dr. Robb Matar, will plan to hold Coumadin for now for further workup concerning falls.  - H/H and Plts wnl - No bleeding reported  Goal of Therapy:  INR 2-3   Plan:  1. No Coumadin tonight 2. Daily PT/INR 3. Follow-up Coumadin plan  Cleon Dew [phone] 11/12/2011,2:38 PM

## 2011-11-12 NOTE — H&P (Signed)
Hospital Admission Note Date: 11/12/2011  PCP: Carollee Herter, MD, MD  Chief Complaint: Near -syncope  History of Present Illness: This is a 77 year old male with past medical history of bradycardia and PE once on 2010 on coumadin, hypothyroidism that comes in for her near syncope. Most of the history is obtained by the ED records her serum no family at bedside. Patient has a degree of Alzheimer's dementia. As per note he has had multiple episodes of these recurrent falls at home today he was sitting in the bedside commode when he passed out. He did not have any chest pain abdominal pain shortness of breath. He did not have any black or bloody stools. Vision denies any weakness. Patient relates some cough. No fevers or chills.    Allergies: Review of patient's allergies indicates no known allergies. Past Medical History  Diagnosis Date  . IBS (irritable bowel syndrome)   . Diverticulosis   . Cataract   . OAB (overactive bladder)   . Macular degeneration   . Alzheimer disease   . Hypertension   . Arthritis   . Osteoporosis   . Parkinsonian syndrome    Prior to Admission medications   Medication Sig Start Date End Date Taking? Authorizing Provider  alendronate (FOSAMAX) 70 MG tablet Take 70 mg by mouth every 7 (seven) days. Takes on Thursdays. Take with a full glass of water on an empty stomach.    Yes Historical Provider, MD  carbidopa-levodopa (SINEMET) 25-100 MG per tablet Take 1 tablet by mouth 3 (three) times daily.     Yes Historical Provider, MD  donepezil (ARICEPT) 10 MG tablet Take 10 mg by mouth at bedtime.     Yes Historical Provider, MD  hydrALAZINE (APRESOLINE) 10 MG tablet Take 10 mg by mouth 3 (three) times daily.     Yes Historical Provider, MD  levothyroxine (SYNTHROID, LEVOTHROID) 25 MCG tablet Take 25 mcg by mouth daily.     Yes Historical Provider, MD  lisinopril (PRINIVIL,ZESTRIL) 20 MG tablet Take 20 mg by mouth daily.     Yes Historical Provider, MD    memantine (NAMENDA) 10 MG tablet Take 10 mg by mouth every evening.     Yes Historical Provider, MD  warfarin (COUMADIN) 7.5 MG tablet Take 7.5 mg by mouth daily.     Yes Historical Provider, MD   Past Surgical History  Procedure Date  . Esophagogastroduodenoscopy   . Eye surgery     LEFT CATARACT   Family History  Problem Relation Age of Onset  . Cancer Mother   . Stroke Father   . Liver disease Father   . Kidney disease Brother    History   Social History  . Marital Status: Single    Spouse Name: N/A    Number of Children: N/A  . Years of Education: N/A   Occupational History  . Not on file.   Social History Main Topics  . Smoking status: Never Smoker   . Smokeless tobacco: Never Used  . Alcohol Use: Not on file  . Drug Use: Not on file  . Sexually Active: Not on file   Other Topics Concern  . Not on file   Social History Narrative  . No narrative on file   Review of Systems: Pertinent items are noted in HPI. Physical Exam: Filed Vitals:   11/12/11 1020 11/12/11 1108 11/12/11 1157 11/12/11 1328  BP: 137/83 140/83 155/85 159/95  Pulse: 68 68 67 72  Temp:   97.7 F (36.5 C)  99.9 F (37.7 C)  TempSrc:   Oral Oral  Resp: 16 16  18   Height:    6\' 1"  (1.854 m)  Weight:    78.7 kg (173 lb 8 oz)  SpO2: 96% 96% 97% 97%   No intake or output data in the 24 hours ending 11/12/11 1412 BP 159/95  Pulse 72  Temp(Src) 99.9 F (37.7 C) (Oral)  Resp 18  Ht 6\' 1"  (1.854 m)  Wt 78.7 kg (173 lb 8 oz)  BMI 22.89 kg/m2  SpO2 97%  General Appearance:    Alert, cooperative, no distress, appears stated age  Head:    Normocephalic, without obvious abnormality, atraumatic  Eyes:    PERRL, conjunctiva/corneas clear, EOM's intact, fundi    benign, both eyes       Ears:    Normal TM's and external ear canals, both ears  Nose:   Nares normal, septum midline, mucosa normal, no drainage    or sinus tenderness  Throat:   Lips, mucosa, and tongue normal; teeth and gums  normal  Neck:   Supple, symmetrical, trachea midline, no adenopathy;       thyroid:  No enlargement/tenderness/nodules; no carotid   bruit or JVD  Back:     Symmetric, no curvature, ROM normal, no CVA tenderness  Lungs:     Clear to auscultation bilaterally, respirations unlabored  Chest wall:    No tenderness or deformity  Heart:    Regular rate and rhythm, S1 and S2 normal, no murmur, rub   or gallop  Abdomen:     Soft, non-tender, bowel sounds active all four quadrants,    no masses, no organomegaly  Genitalia:    Normal male without lesion, discharge or tenderness  Rectal:    Normal tone, normal prostate, no masses or tenderness;   guaiac negative stool  Extremities:   Extremities normal, atraumatic, no cyanosis or edema  Pulses:   2+ and symmetric all extremities  Skin:   chronic changes from venous insufficiency .  Lymph nodes:   Cervical, supraclavicular, and axillary nodes normal  Neurologic:   CNII-XII intact. Normal strength, sensation and reflexes      throughout   Lab results:  [facility] 11/12/11 0839  NA 140  K 4.2  CL 104  CO2 25  GLUCOSE 107*  BUN 12  CREATININE 1.08  CALCIUM 9.7  MG --  PHOS --    Basename 11/12/11 0839  AST 21  ALT 14  ALKPHOS 82  BILITOT 0.5  PROT 8.1  ALBUMIN 3.7    Basename 11/12/11 0839  WBC 7.3  NEUTROABS 5.4  HGB 12.0*  HCT 37.4*  MCV 88.4  PLT 170    Basename 11/12/11 0841  CKTOTAL 127  CKMB 3.2  CKMBINDEX --  TROPONINI <0.30    Imaging results:  Dg Chest 2 View  11/12/2011  *RADIOLOGY REPORT*  Clinical Data: Chest pain weakness  CHEST - 2 VIEW  Comparison: October 22, 2010  Findings: Cardiomegaly is unchanged. The pulmonary vasculature and mediastinum are within normal limits.  There is added opacity within the right mid lung adjacent to the minor fissure.  IMPRESSION: Subtle added opacity in the right upper lobe adjacent to the minor fissure is present which is worrisome for a developing pneumonia.  Cardiomegaly,  unchanged.  Original Report Authenticated By: Brandon Melnick, M.D.   Other results: EKG: normal sinus rhythm, RBBB.   Patient Active Hospital Problem List: Syncope (11/12/2011)  patient has a history of syncope  in the past. Last time he was seen by Dr. Pollyann Kennedy by Dr. Tenny Craw on the beginning of this year. I will go ahead and admit him to telemetry (he had a history of sinus bradycardia and syncope in the past this could be contributing to his syncope) cycle his cardiac enzymes x3 check a TSH, B12 and RBC. Probably contributing to this; could be orthostatic hypotension probably secondary from his Parkinson's disease and or dehydration secondary to his community-acquired pneumonia. We'll also check orthostatics. We'll give him IV fluids gently.  Doubt this is a PE as his INR is supra therapeutic.  ALZHEIMERS DISEASE (09/24/2009)  continue his current meds no changes .  HYPERTENSION, UNSPECIFIED (09/24/2009)  I will continue monitor his blood pressure.Maryclare Labrador hold his hydralazine. In case he is orthostatic .   PNA (pneumonia) (11/12/2011)  this could be contributing to his syncope. He doesn't have a white count doesn't have a fever but he has an infiltrate on chest x-ray and complaining of some cough. He'll continue his Rocephin and azithromycin.   Marinda Elk M.D. [facility] [phone] 11/12/2011, 2:12 PM

## 2011-11-12 NOTE — Telephone Encounter (Signed)
Pt's daughter called to inform you that Ricky [name] had to be taken to ER around 8:00 this morning, He had not been feeling well and was congested, and having diarrhea. He fell twice, not like when he usually falls. His hands were not trembling and he broke out in a cold sweat. The second episode was while the paramedics were there. She wanted to make sure you  Were aware and will let you know what she finds out

## 2011-11-12 NOTE — ED Notes (Signed)
MD at bedside. 

## 2011-11-13 LAB — CBC
MCV: 87.9 fL (ref 78.0–100.0)
Platelets: 151 10*3/uL (ref 150–400)
RDW: 13.6 % (ref 11.5–15.5)
WBC: 6.1 10*3/uL (ref 4.0–10.5)

## 2011-11-13 LAB — PROTIME-INR: Prothrombin Time: 35.1 seconds — ABNORMAL HIGH (ref 11.6–15.2)

## 2011-11-13 LAB — URINE CULTURE: Culture: NO GROWTH

## 2011-11-13 LAB — LEGIONELLA ANTIGEN, URINE: Legionella Antigen, Urine: NEGATIVE

## 2011-11-13 LAB — DIFFERENTIAL
Basophils Absolute: 0 10*3/uL (ref 0.0–0.1)
Eosinophils Relative: 3 % (ref 0–5)
Lymphocytes Relative: 27 % (ref 12–46)

## 2011-11-13 LAB — CARDIAC PANEL(CRET KIN+CKTOT+MB+TROPI)
CK, MB: 2.9 ng/mL (ref 0.3–4.0)
Relative Index: 1.8 (ref 0.0–2.5)
Troponin I: <0.3 ng/mL (ref <0.30)

## 2011-11-13 LAB — FOLATE RBC: RBC Folate: 1228 ng/mL — ABNORMAL HIGH (ref >=366)

## 2011-11-13 LAB — INFLUENZA PANEL BY PCR (TYPE A & B): Influenza B By PCR: NEGATIVE

## 2011-11-13 MED ORDER — GUAIFENESIN-DM 100-10 MG/5ML PO SYRP
10.0000 mL | ORAL_SOLUTION | ORAL | Status: DC | PRN
  Administered 2011-11-13 – 2011-11-15 (×2): 10 mL via ORAL
  Filled 2011-11-13 (×2): qty 10

## 2011-11-13 MED FILL — Perflutren Lipid Microsphere IV Susp 6.52 MG/ML: INTRAVENOUS | Qty: 2 | Status: AC

## 2011-11-13 NOTE — Progress Notes (Signed)
ANTICOAGULATION CONSULT NOTE - Initial Consult  Pharmacy Consult for Coumadin Indication: hx PE  No Known Allergies  Patient Measurements: Height: 6\' 1"  (185.4 cm) Weight: 174 lb 9.7 oz (79.2 kg) (bedscale) IBW/kg (Calculated) : 79.9  Adjusted Body Weight:   Vital Signs: Temp: 98.1 F (36.7 C) (12/27 0632) Temp src: Oral (12/27 1478) BP: 136/72 mmHg (12/27 0632) Pulse Rate: 70  (12/27 0632)  Labs:  Basename 11/13/11 2956 11/13/11 0005 11/12/11 1553 11/12/11 1552 11/12/11 0841 11/12/11 0839  HGB 10.7* -- -- -- -- 12.0*  HCT 33.5* -- -- -- -- 37.4*  PLT 151 -- -- -- -- 170  APTT -- -- -- -- -- --  LABPROT 35.1* -- -- 35.3* -- 40.1*  INR 3.43* -- -- 3.46* -- 4.07*  HEPARINUNFRC -- -- -- -- -- --  CREATININE -- -- -- -- -- 1.08  CKTOTAL -- 164 132 -- 127 --  CKMB -- 2.9 3.1 -- 3.2 --  TROPONINI -- <0.30 <0.30 -- <0.30 --   Estimated Creatinine Clearance: 48.9 ml/min (by C-G formula based on Cr of 1.08).  Medical History: Past Medical History  Diagnosis Date  . IBS (irritable bowel syndrome)   . Diverticulosis   . Cataract   . OAB (overactive bladder)   . Macular degeneration   . Alzheimer disease   . Hypertension   . Arthritis   . Osteoporosis   . Parkinsonian syndrome     Medications:  Prescriptions prior to admission  Medication Sig Dispense Refill  . alendronate (FOSAMAX) 70 MG tablet Take 70 mg by mouth every 7 (seven) days. Takes on Thursdays. Take with a full glass of water on an empty stomach.       . carbidopa-levodopa (SINEMET) 25-100 MG per tablet Take 1 tablet by mouth 3 (three) times daily.        Marland Kitchen donepezil (ARICEPT) 10 MG tablet Take 10 mg by mouth at bedtime.        . hydrALAZINE (APRESOLINE) 10 MG tablet Take 10 mg by mouth 3 (three) times daily.        Marland Kitchen levothyroxine (SYNTHROID, LEVOTHROID) 25 MCG tablet Take 25 mcg by mouth daily.        Marland Kitchen lisinopril (PRINIVIL,ZESTRIL) 20 MG tablet Take 20 mg by mouth daily.        . memantine (NAMENDA) 10  MG tablet Take 10 mg by mouth every evening.        . warfarin (COUMADIN) 7.5 MG tablet Take 7.5 mg by mouth daily.         Assessment: 89yom on chronic Coumadin for hx PE.  Admission INR (3.43) is supratherapeutic. Holding coumadin for now for further workup concerning falls.  - H/H and Plts wnl - No bleeding reported  Goal of Therapy:  INR 2-3   Plan:  1. No Coumadin tonight 2. Daily PT/INR 3. Follow-up Coumadin plan  Janace Litten, PharmD  11/13/2011,11:12 AM

## 2011-11-13 NOTE — Progress Notes (Signed)
Subjective: He relates he is feeling much better. He continues to have cough. He relates his shortness of breath is much better improved than yesterday Objective: Filed Vitals:   11/12/11 1652 11/12/11 1900 11/12/11 2154 11/13/11 0632  BP: 178/94 169/91 153/83 136/72  Pulse:  70 74 70  Temp:  99.1 F (37.3 C)  98.1 F (36.7 C)  TempSrc:    Oral  Resp:   20 18  Height:      Weight:    79.2 kg (174 lb 9.7 oz)  SpO2:  97% 94% 92%   Weight change:   Intake/Output Summary (Last 24 hours) at 11/13/11 1059 Last data filed at 11/13/11 0947  Gross per 24 hour  Intake   2670 ml  Output    550 ml  Net   2120 ml    General: Alert, awake, oriented x3, in no acute distress.  HEENT: No bruits, no goiter.  Heart: Regular rate and rhythm, without murmurs, rubs, gallops.  Lungs: Good air movement and clear to auscultation Abdomen: Soft, nontender, nondistended, positive bowel sounds.  Neuro: Grossly intact, nonfocal.   Lab Results:  [facility] 11/12/11 0839  NA 140  K 4.2  CL 104  CO2 25  GLUCOSE 107*  BUN 12  CREATININE 1.08  CALCIUM 9.7  MG --  PHOS --    Basename 11/12/11 0839  AST 21  ALT 14  ALKPHOS 82  BILITOT 0.5  PROT 8.1  ALBUMIN 3.7   No results found for this basename: LIPASE:2,AMYLASE:2 in the last 72 hours  Basename 11/13/11 0608 11/12/11 0839  WBC 6.1 7.3  NEUTROABS 3.5 5.4  HGB 10.7* 12.0*  HCT 33.5* 37.4*  MCV 87.9 88.4  PLT 151 170    Basename 11/13/11 0005 11/12/11 1553 11/12/11 0841  CKTOTAL 164 132 127  CKMB 2.9 3.1 3.2  CKMBINDEX -- -- --  TROPONINI <0.30 <0.30 <0.30   No components found with this basename: POCBNP:3 No results found for this basename: DDIMER:2 in the last 72 hours No results found for this basename: HGBA1C:2 in the last 72 hours No results found for this basename: CHOL:2,HDL:2,LDLCALC:2,TRIG:2,CHOLHDL:2,LDLDIRECT:2 in the last 72 hours  Basename 11/12/11 1552  TSH 0.648  T4TOTAL --  T3FREE --  THYROIDAB --     Basename 11/12/11 1552  VITAMINB12 902  FOLATE --  FERRITIN --  TIBC --  IRON --  RETICCTPCT --  2-d echo: Left ventricle: The cavity size was normal. There was mild concentric hypertrophy. Systolic function was normal. The estimated ejection fraction was in the range of 55% to 60%. Wall motion was normal; there were no regional wall motion abnormalities. Doppler parameters are consistent with abnormal left ventricular relaxation (grade 1 diastolic dysfunction).    Micro Results: Recent Results (from the past 240 hour(s))  CULTURE, BLOOD (ROUTINE X 2)     Status: Normal (Preliminary result)   Collection Time   11/12/11  3:25 PM      Component Value Range Status Comment   Specimen Description BLOOD LEFT ARM LOWER ARM   Final    Special Requests BOTTLES DRAWN AEROBIC AND ANAEROBIC 10CC   Final    Setup Time [id number]   Final    Culture     Final    Value:        BLOOD CULTURE RECEIVED NO GROWTH TO DATE CULTURE WILL BE HELD FOR 5 DAYS BEFORE ISSUING A FINAL NEGATIVE REPORT   Report Status PENDING   Incomplete   CULTURE, BLOOD (  ROUTINE X 2)     Status: Normal (Preliminary result)   Collection Time   11/12/11  3:34 PM      Component Value Range Status Comment   Specimen Description BLOOD LEFT ARM   Final    Special Requests BOTTLES DRAWN AEROBIC AND ANAEROBIC 10CC   Final    Setup Time [id number]   Final    Culture     Final    Value:        BLOOD CULTURE RECEIVED NO GROWTH TO DATE CULTURE WILL BE HELD FOR 5 DAYS BEFORE ISSUING A FINAL NEGATIVE REPORT   Report Status PENDING   Incomplete     Studies/Results: Dg Chest 2 View  11/12/2011  *RADIOLOGY REPORT*  Clinical Data: Chest pain weakness  CHEST - 2 VIEW  Comparison: October 22, 2010  Findings: Cardiomegaly is unchanged. The pulmonary vasculature and mediastinum are within normal limits.  There is added opacity within the right mid lung adjacent to the minor fissure.  IMPRESSION: Subtle added opacity in the right  upper lobe adjacent to the minor fissure is present which is worrisome for a developing pneumonia.  Cardiomegaly, unchanged.  Original Report Authenticated By: Brandon Melnick, M.D.    Medications: I have reviewed the patient's current medications.   Principal Problem:  *Syncope Active Problems:  ALZHEIMERS DISEASE  HYPERTENSION, UNSPECIFIED  PNA (pneumonia)    Assessment and plan: -Orthostatics were not checked. He has remained above 50 and his heart rate on telemetry. He relates his cough and weakness is much better has been on 24 hours of antibiotic. The muscle a cause for his syncope multifactorial secondary to dehydration and pneumonia. He has been +2 L. We'll KVO his IV fluids continue his treatment for pneumonia with antibiotics for now. We'll get physical therapy involved. To see his home requirements or whether he need skilled nursing facility.    LOS: 1 day   Marinda Elk M.D. Pager: [phone] [facility] 11/13/2011, 10:59 AM

## 2011-11-13 NOTE — Progress Notes (Signed)
Physical Therapy Evaluation Patient Details Name: Ricky [name] MRN: [id number] DOB: 1917/04/27 Today's Date: 11/13/2011  Problem List:  Patient Active Problem List  Diagnoses  . HYPOTHYROIDISM  . GOUT, UNSPECIFIED  . ALZHEIMERS DISEASE  . HYPERTENSION, UNSPECIFIED  . BRADYCARDIA  . GASTROPARESIS  . DYSPHAGIA UNSPECIFIED  . SYNCOPE, HX OF  . PULMONARY EMBOLISM, HX OF  . Rectal prolapse  . PNA (pneumonia)  . Syncope    Past Medical History:  Past Medical History  Diagnosis Date  . IBS (irritable bowel syndrome)   . Diverticulosis   . Cataract   . OAB (overactive bladder)   . Macular degeneration   . Alzheimer disease   . Hypertension   . Arthritis   . Osteoporosis   . Parkinsonian syndrome    Past Surgical History:  Past Surgical History  Procedure Date  . Esophagogastroduodenoscopy   . Eye surgery     LEFT CATARACT    PT Assessment/Plan/Recommendation PT Assessment Clinical Impression Statement: pt presents with PNA and r/o flu.  pt mildly confused, but anxious to get OOB.   PT Recommendation/Assessment: Patient will need skilled PT in the acute care venue PT Problem List: Decreased strength;Decreased activity tolerance;Decreased balance;Decreased mobility;Decreased knowledge of use of DME;Cardiopulmonary status limiting activity Barriers to Discharge:  (?pt notes family near 24/7 and [facility] daily.  ) PT Therapy Diagnosis : Difficulty walking;Generalized weakness PT Plan PT Frequency: Min 3X/week PT Treatment/Interventions: DME instruction;Gait training;Stair training;Functional mobility training;Therapeutic activities;Therapeutic exercise;Balance training;Patient/family education PT Recommendation Recommendations for Other Services: OT consult Follow Up Recommendations: Home health PT;24 hour supervision/assistance Equipment Recommended: Rolling walker with 5" wheels PT Goals  Acute Rehab PT Goals PT Goal Formulation: With patient Time For Goal  Achievement: 2 weeks Pt will go Supine/Side to Sit: Independently PT Goal: Supine/Side to Sit - Progress: Not met Pt will go Sit to Supine/Side: Independently PT Goal: Sit to Supine/Side - Progress: Not met Pt will go Sit to Stand: with supervision PT Goal: Sit to Stand - Progress: Not met Pt will Ambulate: >150 feet;with supervision;with rolling walker PT Goal: Ambulate - Progress: Not met Pt will Go Up / Down Stairs: 3-5 stairs;with min assist;with least restrictive assistive device PT Goal: Up/Down Stairs - Progress: Not met  PT Evaluation Precautions/Restrictions  Precautions Precautions: Fall (Contact and Droplet) Restrictions Weight Bearing Restrictions: No Prior Functioning  Home Living Lives With: Sheran Spine Help From: Family;Personal care attendant Type of Home: House Home Layout: Two level;Able to live on main level with bedroom/bathroom Home Access: Stairs to enter Entrance Stairs-Number of Steps:  (pt unclear on number of stairs) Home Adaptive Equipment: Quad cane Prior Function Level of Independence: Requires assistive device for independence;Independent with transfers;Independent with gait;Needs assistance with ADLs;Needs assistance with homemaking Able to Take Stairs?: Yes Driving: No Cognition Cognition Orientation Level: Oriented to person;Oriented to place Sensation/Coordination   Extremity Assessment RLE Assessment RLE Assessment:  (Grossly 4/5) LLE Assessment LLE Assessment:  (Grossly 4/5) Mobility (including Balance) Bed Mobility Bed Mobility: Yes Supine to Sit: 3: Mod assist Supine to Sit Details (indicate cue type and reason): cues for encouragement and use of rail.   Sitting - Scoot to Edge of Bed: 2: Max assist Sitting - Scoot to [place] of Bed Details (indicate cue type and reason): cues for use of UEs for reciprocal scoot.   Transfers Transfers: Yes Sit to Stand: 3: Mod assist;With upper extremity assist;From bed Sit to Stand Details  (indicate cue type and reason): cues for UE use, anterior wt  shift, trubnk/hip extension Stand to Sit: 3: Mod assist;With upper extremity assist;With armrests;To chair/3-in-1 Stand to Sit Details: cues to use armrests and control descent Ambulation/Gait Ambulation/Gait: No Stairs: No Wheelchair Mobility Wheelchair Mobility: No    Exercise    End of Session PT - End of Session Equipment Utilized During Treatment: Gait belt Activity Tolerance: Patient limited by fatigue Patient left: in chair;with call bell in reach Nurse Communication: Mobility status for transfers General Behavior During Session: [facility] for tasks performed Cognition: [facility] for tasks performed  Sunny Schlein, [place] [phone] 11/13/2011, 3:11 PM

## 2011-11-13 NOTE — Plan of Care (Signed)
Problem: Phase II Progression Outcomes Goal: Wean O2 if indicated Outcome: Completed/Met Date Met:  11/13/11 Room air sats are 94%

## 2011-11-14 MED ORDER — SODIUM CHLORIDE 0.9 % IJ SOLN
3.0000 mL | Freq: Two times a day (BID) | INTRAMUSCULAR | Status: DC
  Administered 2011-11-14 – 2011-11-15 (×3): 3 mL via INTRAVENOUS

## 2011-11-14 MED ORDER — AZITHROMYCIN 500 MG PO TABS
500.0000 mg | ORAL_TABLET | Freq: Every day | ORAL | Status: DC
  Administered 2011-11-14 – 2011-11-15 (×2): 500 mg via ORAL
  Filled 2011-11-14 (×2): qty 1

## 2011-11-14 NOTE — Progress Notes (Signed)
[facility] progress note    Interval h/o:- 77 year old  A a male, admitted 12.26 with past medical history of bradycardia and PE once on 2010 on coumadin, hypothyroidism that comes in for near syncope.  Found to have coincidental PNA on 12.26 CXR [place] 12.26>> Ceftriaxone 12.26>>12.28  Subjective: Well.knows president, doesn't correctly ID season.  Doesn't know know how long he has been in the hospital.   Objective: Vital signs in last 24 hours: Temp:  [98.3 F (36.8 C)-98.6 F (37 C)] 98.3 F (36.8 C) (12/28 0406) Pulse Rate:  [59-63] 59  (12/28 0406) Resp:  [18] 18  (12/28 0406) BP: (148-187)/(88-93) 178/88 mmHg (12/28 0407) SpO2:  [94 %-96 %] 94 % (12/28 0406) Weight:  [77 kg (169 lb 12.1 oz)] 169 lb 12.1 oz (77 kg) (12/28 0406) Weight change: -1.7 kg (-3 lb 12 oz)  Intake/Output Summary (Last 24 hours) at 11/14/11 1401 Last data filed at 11/14/11 1326  Gross per 24 hour  Intake   1080 ml  Output    200 ml  Net    880 ml    BP 154/80  Pulse 60  Temp(Src) 98.8 F (37.1 C) (Oral)  Resp 18  Ht 6\' 1"  (1.854 m)  Wt 77 kg (169 lb 12.1 oz)  BMI 22.40 kg/m2  SpO2 94% General appearance: alert and cooperative Throat: lips, mucosa, and tongue normal; teeth and gums normal Lungs: clear to auscultation bilaterally Heart: regular rate and rhythm, S1, S2 normal, no murmur, click, rub or gallop Extremities: extremities normal, atraumatic, no cyanosis or edema Neurologic: Grossly normal  Lab Results:  [facility] 11/12/11 0839  NA 140  K 4.2  CL 104  CO2 25  GLUCOSE 107*  BUN 12  CREATININE 1.08  CALCIUM 9.7  MG --  PHOS --    Basename 11/12/11 0839  AST 21  ALT 14  ALKPHOS 82  BILITOT 0.5  PROT 8.1  ALBUMIN 3.7   No results found for this basename: LIPASE:2,AMYLASE:2 in the last 72 hours  Basename 11/13/11 0608 11/12/11 0839  WBC 6.1 7.3  NEUTROABS 3.5 5.4  HGB 10.7* 12.0*  HCT 33.5* 37.4*  MCV 87.9 88.4  PLT 151 170    Basename  11/13/11 0005 11/12/11 1553 11/12/11 0841  CKTOTAL 164 132 127  CKMB 2.9 3.1 3.2  CKMBINDEX -- -- --  TROPONINI <0.30 <0.30 <0.30   IMPRESSION:  Subtle added opacity in the right upper lobe adjacent to the minor  fissure is present which is worrisome for a developing pneumonia.  Cardiomegaly, unchanged.   Basename 11/12/11 1552  TSH 0.648  T4TOTAL --  T3FREE --  THYROIDAB --    Basename 11/12/11 1552  VITAMINB12 902  FOLATE --  FERRITIN --  TIBC --  IRON --  RETICCTPCT --   Micro Results: Recent Results (from the past 240 hour(s))  URINE CULTURE     Status: Normal   Collection Time   11/12/11  9:55 AM      Component Value Range Status Comment   Specimen Description URINE, CATHETERIZED   Final    Special Requests NONE   Final    Setup Time [id number]   Final    Colony Count NO GROWTH   Final    Culture NO GROWTH   Final    Report Status 11/13/2011 FINAL   Final   CULTURE, BLOOD (ROUTINE X 2)     Status: Normal (Preliminary result)   Collection Time   11/12/11  3:25 PM  Component Value Range Status Comment   Specimen Description BLOOD LEFT ARM LOWER ARM   Final    Special Requests BOTTLES DRAWN AEROBIC AND ANAEROBIC 10CC   Final    Setup Time [id number]   Final    Culture     Final    Value:        BLOOD CULTURE RECEIVED NO GROWTH TO DATE CULTURE WILL BE HELD FOR 5 DAYS BEFORE ISSUING A FINAL NEGATIVE REPORT   Report Status PENDING   Incomplete   CULTURE, BLOOD (ROUTINE X 2)     Status: Normal (Preliminary result)   Collection Time   11/12/11  3:34 PM      Component Value Range Status Comment   Specimen Description BLOOD LEFT ARM   Final    Special Requests BOTTLES DRAWN AEROBIC AND ANAEROBIC 10CC   Final    Setup Time [id number]   Final    Culture     Final    Value:        BLOOD CULTURE RECEIVED NO GROWTH TO DATE CULTURE WILL BE HELD FOR 5 DAYS BEFORE ISSUING A FINAL NEGATIVE REPORT   Report Status PENDING   Incomplete            Medications: I have reviewed the patient's current medications. Scheduled Meds:   . azithromycin  500 mg Intravenous Q24H  . carbidopa-levodopa  1 tablet Oral TID  . cefTRIAXone (ROCEPHIN)  IV  1 g Intravenous Q24H  . donepezil  10 mg Oral QHS  . levothyroxine  25 mcg Oral Daily  . lisinopril  20 mg Oral Daily  . memantine  10 mg Oral QPM  . sodium chloride  3 mL Intravenous Q12H   Continuous Infusions:  PRN Meds:.guaiFENesin-dextromethorphan   Assessment/Plan: Patient Active Hospital Problem List: Syncope (11/12/2011)   Assessment: HR still low and in the 40's occasioanally at night when he is asleep.  NO further falls or orthostatic events.  Will have PT/OT clear him and reassess how he does. Given his propensity to fall, wouldn't keep on Coumarin but instead defer to out-patient PCP to decide the benefits vs risk of bleed with falls  ALZHEIMERS DISEASE (09/24/2009)   Assessment: stage 2-3-his on Levodopa as well.. Could be l;ewy body dementia  HYPERTENSION, UNSPECIFIED (09/24/2009)   Assessment: moderate controlled.  Would add low dose Amlodipine for better control, but would have to be careful-could cause orthostasis   PNA (pneumonia) (11/12/2011)   Assessment: D#2Abx-narrow to Azithromycin only and reassess with CBC am.   H/o Pulm embolism-See #1 for discussion    Tried to fully updated family as to the course of their family members care.  Will attempt to call again in am         LOS: 2 days   [facility] 11/14/2011, 2:01 PM

## 2011-11-15 LAB — CBC
HCT: 36.8 % — ABNORMAL LOW (ref 39.0–52.0)
Hemoglobin: 11.6 g/dL — ABNORMAL LOW (ref 13.0–17.0)
MCV: 88.2 fL (ref 78.0–100.0)
RBC: 4.17 MIL/uL — ABNORMAL LOW (ref 4.22–5.81)
RDW: 13.4 % (ref 11.5–15.5)
WBC: 5.9 10*3/uL (ref 4.0–10.5)

## 2011-11-15 MED ORDER — ASPIRIN EC 81 MG PO TBEC: 81.0000 mg | DELAYED_RELEASE_TABLET | Freq: Every day | ORAL | Status: AC

## 2011-11-15 MED ORDER — AZITHROMYCIN 500 MG PO TABS: 500.0000 mg | ORAL_TABLET | Freq: Every day | ORAL | Status: AC

## 2011-11-15 NOTE — Discharge Summary (Signed)
Physician Discharge Summary  Patient ID: Ricky [name] MRN: [id number] DOB/AGE: 1917-02-15 77 y.o.  Admit date: 11/12/2011 Discharge date: 11/15/2011  Admission Diagnoses: Likely Pneumonia  Discharge Diagnoses:  Principal Problem:  *Syncope Active Problems:  ALZHEIMERS DISEASE  HYPERTENSION, UNSPECIFIED  PNA (pneumonia)   Discharged Condition: good  Hospital Course: 77 year old male with past medical history of bradycardia and PE once on 2010 on coumadin, hypothyroidism that comes in for her near syncope. has a degree of Alzheimer's dementia. As per note he has had multiple episodes of these recurrent falls at home today he was sitting in the bedside commode when he passed out. Coincidentally CXR noted Possible Early PNA-Was covered on CAP PNA rx with intially Vancomycin and Avelox-then Levaquin which was subsequently transitioned over to oral Levaquin.  His Coumadin was held and ultimately stopped given his recurrent falls--he was on this for Pulmonary Embolism-This was transitioned over to ASA in hospital His bradycardia was stable, although he dropped to the 40's overnight with his pulse rate--His hydralazine  held, but might need to be re-implemented.  Family notes patient has been seen for a movement disorder and was on Levodopa which seemed to help with balance, but did cause some clouding of sensorium--Patient should follow up with neurology for recommendations, as he has become more unbalanced after being taken off of Levodopa in late November   Consults: none  Significant Diagnostic Studies: labs: WBC was 11 on admit, microbiology: Flu test was negative and  radiology: CXR: 12.26  IMPRESSION:  Subtle added opacity in the right upper lobe adjacent to the minor  fissure is present which is worrisome for a developing pneumonia.   Treatments: IV hydration, antibiotics: vancomycin and azithromycin and cardiac meds: lisinopril (Zestril)  Discharge Exam: Blood pressure  137/84, pulse 84, temperature 99.1 F (37.3 C), temperature source Oral, resp. rate 19, height 6\' 1"  (1.854 m), weight 77.111 kg (170 lb), SpO2 93.00%. General appearance: alert and cooperative Head: Normocephalic, without obvious abnormality, atraumatic Eyes: conjunctivae/corneas clear. PERRL, EOM's intact. Fundi benign. Throat: lips, mucosa, and tongue normal; teeth and gums normal Resp: clear to auscultation bilaterally Cardio: regular rate and rhythm, S1, S2 normal, no murmur, click, rub or gallop Extremities: extremities normal, atraumatic, no cyanosis or edema Pulses: 2+ and symmetric Neurologic: Grossly normal  Disposition:  Follow-up Appointments: Discharge Orders    Future Orders Please Complete By Expires   Diet - low sodium heart healthy      Increase activity slowly      Call MD for:  temperature >100.4      Call MD for:  persistant nausea and vomiting      Call MD for:  severe uncontrolled pain      Call MD for:  hives      Call MD for:  extreme fatigue         Discharge Medications: Current Discharge Medication List    START taking these medications   Details  aspirin EC 81 MG tablet Take 1 tablet (81 mg total) by mouth daily. Qty: 30 tablet, Refills: 11    azithromycin (ZITHROMAX) 500 MG tablet Take 1 tablet (500 mg total) by mouth daily. Qty: 2 tablet, Refills: 0      CONTINUE these medications which have NOT CHANGED   Details  alendronate (FOSAMAX) 70 MG tablet Take 70 mg by mouth every 7 (seven) days. Takes on Thursdays. Take with a full glass of water on an empty stomach.     donepezil (ARICEPT) 10 MG tablet  Take 10 mg by mouth at bedtime.      levothyroxine (SYNTHROID, LEVOTHROID) 25 MCG tablet Take 25 mcg by mouth daily.      lisinopril (PRINIVIL,ZESTRIL) 20 MG tablet Take 20 mg by mouth daily.      memantine (NAMENDA) 10 MG tablet Take 10 mg by mouth every evening.        STOP taking these medications     carbidopa-levodopa (SINEMET) 25-100  MG per tablet      hydrALAZINE (APRESOLINE) 10 MG tablet      warfarin (COUMADIN) 7.5 MG tablet        Follow-up Information    Follow up with Carollee Herter, MD. Make an appointment in 5 days.      Follow up with Evie Lacks, MD. Make an appointment in 2 weeks.   Contact information:   [address] [place] [address] [phone]         Follow up with PCP as per above  Signed: Izaiyah Kleinman,JAI 11/15/2011, 12:57 PM

## 2011-11-16 ENCOUNTER — Emergency Department (HOSPITAL_COMMUNITY): Payer: Medicare PPO

## 2011-11-16 ENCOUNTER — Encounter (HOSPITAL_COMMUNITY): Payer: Self-pay | Admitting: Emergency Medicine

## 2011-11-16 ENCOUNTER — Emergency Department (HOSPITAL_COMMUNITY)
Admission: EM | Admit: 2011-11-16 | Discharge: 2011-11-16 | Disposition: A | Discharge status: Home / Self Care | Payer: Medicare PPO | Attending: Emergency Medicine | Admitting: Emergency Medicine

## 2011-11-16 ENCOUNTER — Other Ambulatory Visit: Payer: Self-pay

## 2011-11-16 DIAGNOSIS — R5381 Other malaise: Secondary | ICD-10-CM | POA: Insufficient documentation

## 2011-11-16 DIAGNOSIS — Z86718 Personal history of other venous thrombosis and embolism: Secondary | ICD-10-CM

## 2011-11-16 DIAGNOSIS — G309 Alzheimer's disease, unspecified: Secondary | ICD-10-CM | POA: Insufficient documentation

## 2011-11-16 DIAGNOSIS — E039 Hypothyroidism, unspecified: Secondary | ICD-10-CM

## 2011-11-16 DIAGNOSIS — I10 Essential (primary) hypertension: Secondary | ICD-10-CM

## 2011-11-16 DIAGNOSIS — R4182 Altered mental status, unspecified: Secondary | ICD-10-CM | POA: Insufficient documentation

## 2011-11-16 DIAGNOSIS — J189 Pneumonia, unspecified organism: Secondary | ICD-10-CM

## 2011-11-16 DIAGNOSIS — R55 Syncope and collapse: Secondary | ICD-10-CM

## 2011-11-16 DIAGNOSIS — M109 Gout, unspecified: Secondary | ICD-10-CM

## 2011-11-16 DIAGNOSIS — Z8669 Personal history of other diseases of the nervous system and sense organs: Secondary | ICD-10-CM

## 2011-11-16 DIAGNOSIS — K623 Rectal prolapse: Secondary | ICD-10-CM

## 2011-11-16 DIAGNOSIS — G20A1 Parkinson's disease without dyskinesia, without mention of fluctuations: Secondary | ICD-10-CM | POA: Insufficient documentation

## 2011-11-16 DIAGNOSIS — Z79899 Other long term (current) drug therapy: Secondary | ICD-10-CM | POA: Insufficient documentation

## 2011-11-16 DIAGNOSIS — G20 Parkinson's disease: Secondary | ICD-10-CM | POA: Insufficient documentation

## 2011-11-16 DIAGNOSIS — M810 Age-related osteoporosis without current pathological fracture: Secondary | ICD-10-CM | POA: Insufficient documentation

## 2011-11-16 DIAGNOSIS — Z7982 Long term (current) use of aspirin: Secondary | ICD-10-CM | POA: Insufficient documentation

## 2011-11-16 DIAGNOSIS — R404 Transient alteration of awareness: Secondary | ICD-10-CM | POA: Insufficient documentation

## 2011-11-16 DIAGNOSIS — F0280 Dementia in other diseases classified elsewhere without behavioral disturbance: Secondary | ICD-10-CM

## 2011-11-16 DIAGNOSIS — I498 Other specified cardiac arrhythmias: Secondary | ICD-10-CM

## 2011-11-16 DIAGNOSIS — M129 Arthropathy, unspecified: Secondary | ICD-10-CM | POA: Insufficient documentation

## 2011-11-16 DIAGNOSIS — R1310 Dysphagia, unspecified: Secondary | ICD-10-CM

## 2011-11-16 DIAGNOSIS — R5383 Other fatigue: Secondary | ICD-10-CM | POA: Insufficient documentation

## 2011-11-16 DIAGNOSIS — K3184 Gastroparesis: Secondary | ICD-10-CM

## 2011-11-16 LAB — COMPREHENSIVE METABOLIC PANEL
AST: 17 U/L (ref 0–37)
BUN: 17 mg/dL (ref 6–23)
CO2: 27 mEq/L (ref 19–32)
Calcium: 9.4 mg/dL (ref 8.4–10.5)
Chloride: 103 mEq/L (ref 96–112)
Creatinine, Ser: 1.12 mg/dL (ref 0.50–1.35)
GFR calc Af Amer: 64 mL/min — ABNORMAL LOW (ref >90)
GFR calc non Af Amer: 55 mL/min — ABNORMAL LOW (ref >90)
Glucose, Bld: 104 mg/dL — ABNORMAL HIGH (ref 70–99)
Total Bilirubin: 0.5 mg/dL (ref 0.3–1.2)

## 2011-11-16 LAB — DIFFERENTIAL
Basophils Absolute: 0 10*3/uL (ref 0.0–0.1)
Eosinophils Relative: 2 % (ref 0–5)
Lymphocytes Relative: 23 % (ref 12–46)
Monocytes Absolute: 0.8 10*3/uL (ref 0.1–1.0)
Monocytes Relative: 12 % (ref 3–12)
Neutro Abs: 4.2 10*3/uL (ref 1.7–7.7)

## 2011-11-16 LAB — CBC
HCT: 37.9 % — ABNORMAL LOW (ref 39.0–52.0)
Hemoglobin: 12.1 g/dL — ABNORMAL LOW (ref 13.0–17.0)
MCV: 89 fL (ref 78.0–100.0)
RDW: 13.4 % (ref 11.5–15.5)
WBC: 6.5 10*3/uL (ref 4.0–10.5)

## 2011-11-16 LAB — URINALYSIS, ROUTINE W REFLEX MICROSCOPIC
Hgb urine dipstick: NEGATIVE
Leukocytes, UA: NEGATIVE
Nitrite: NEGATIVE
Protein, ur: NEGATIVE mg/dL
Specific Gravity, Urine: 1.016 (ref 1.005–1.030)
Urobilinogen, UA: 0.2 mg/dL (ref 0.0–1.0)

## 2011-11-16 LAB — GLUCOSE, CAPILLARY: Glucose-Capillary: 96 mg/dL (ref 70–99)

## 2011-11-16 LAB — PROTIME-INR
INR: 1.37 (ref 0.00–1.49)
Prothrombin Time: 17.1 seconds — ABNORMAL HIGH (ref 11.6–15.2)

## 2011-11-16 MED ORDER — LISINOPRIL 20 MG PO TABS
20.0000 mg | ORAL_TABLET | Freq: Once | ORAL | Status: AC
  Administered 2011-11-16: 20 mg via ORAL
  Filled 2011-11-16 (×2): qty 1

## 2011-11-16 MED ORDER — AZITHROMYCIN 250 MG PO TABS
250.0000 mg | ORAL_TABLET | Freq: Once | ORAL | Status: AC
  Administered 2011-11-16: 250 mg via ORAL
  Filled 2011-11-16: qty 1

## 2011-11-16 MED ORDER — AZITHROMYCIN 250 MG PO TABS: ORAL_TABLET | ORAL | Status: DC

## 2011-11-16 MED ORDER — ASPIRIN 81 MG PO CHEW
81.0000 mg | CHEWABLE_TABLET | Freq: Once | ORAL | Status: AC
  Administered 2011-11-16: 81 mg via ORAL
  Filled 2011-11-16: qty 1

## 2011-11-16 MED ORDER — LEVOTHYROXINE SODIUM 25 MCG PO TABS
25.0000 ug | ORAL_TABLET | Freq: Once | ORAL | Status: AC
  Administered 2011-11-16: 25 ug via ORAL
  Filled 2011-11-16 (×2): qty 1

## 2011-11-16 NOTE — ED Notes (Signed)
PT's CBG was 96. 9:08 am JG

## 2011-11-16 NOTE — ED Provider Notes (Signed)
History     CSN: [id number]  Arrival date & time 11/16/11  0726   First MD Initiated Contact with Patient 11/16/11 0730      Chief Complaint  Patient presents with  . Weakness  . Loss of Consciousness    (Consider location/radiation/quality/duration/timing/severity/associated sxs/prior treatment) HPI Patient is a 77 year old male who presents by EMS for evaluation of possible loss of consciousness and behavior different from baseline. Patient was discharged from this hospital yesterday after an admission for pneumonia. Patient was discharged on azithromycin. During his stay patient did have Coumadin discontinued. This is due to recent falls. Patient had previously been treated for pulmonary embolus with this. Patient's vital signs are stable on arrival. He is unable to tell me the year but knows who he is and that he is at a hospital. There are no reported changes from neurologic baseline other than "not acting right". Family is not available at bedside. Patient does also have a history of Parkinson's but is not currently being treated with levodopa. This information is obtained from prior hospitalist discharge chart. They have recommended that the patient followup with a neurologist. Patient has no other concerning findings at this time. He denies any pain whatsoever. There are no other associated or modifying factors. Past Medical History  Diagnosis Date  . IBS (irritable bowel syndrome)   . Diverticulosis   . Cataract   . OAB (overactive bladder)   . Macular degeneration   . Alzheimer disease   . Hypertension   . Arthritis   . Osteoporosis   . Parkinsonian syndrome     Past Surgical History  Procedure Date  . Esophagogastroduodenoscopy   . Eye surgery     LEFT CATARACT    Family History  Problem Relation Age of Onset  . Cancer Mother   . Stroke Father   . Liver disease Father   . Kidney disease Brother     History  Substance Use Topics  . Smoking status: Never  Smoker   . Smokeless tobacco: Never Used  . Alcohol Use: No      Review of Systems  Unable to perform ROS: Dementia    Allergies  Review of patient's allergies indicates no known allergies.  Home Medications   Current Outpatient Rx  Name Route Sig Dispense Refill  . ALENDRONATE SODIUM 70 MG PO TABS Oral Take 70 mg by mouth every 7 (seven) days. Takes on Thursdays. Take with a full glass of water on an empty stomach. Hold while in hospital    . ASPIRIN EC 81 MG PO TBEC Oral Take 1 tablet (81 mg total) by mouth daily. 30 tablet 11  . AZITHROMYCIN 500 MG PO TABS Oral Take 1 tablet (500 mg total) by mouth daily. 2 tablet 0  . DONEPEZIL HCL 10 MG PO TABS Oral Take 10 mg by mouth at bedtime.      Marland Kitchen LEVOTHYROXINE SODIUM 25 MCG PO TABS Oral Take 25 mcg by mouth daily.      Marland Kitchen LISINOPRIL 20 MG PO TABS Oral Take 20 mg by mouth daily.      Marland Kitchen MEMANTINE HCL 10 MG PO TABS Oral Take 10 mg by mouth every evening.        BP 145/81  Pulse 65  Temp(Src) 98 F (36.7 C) (Oral)  Resp 19  SpO2 93%  Physical Exam  Nursing note and vitals reviewed. Constitutional: He appears well-developed and well-nourished. No distress.  HENT:  Head: Normocephalic and atraumatic.  Patient has slackened facies. He does drool. This does not appear to be due to problems with his airway.  Eyes: Conjunctivae and EOM are normal. Pupils are equal, round, and reactive to light.  Neck: Normal range of motion.  Cardiovascular: Normal rate, regular rhythm, normal heart sounds and intact distal pulses.  Exam reveals no gallop and no friction rub.   No murmur heard. Pulmonary/Chest: Effort normal and breath sounds normal. No respiratory distress. He has no wheezes. He has no rales.  Abdominal: Soft. Bowel sounds are normal. He exhibits no distension. There is no tenderness. There is no rebound and no guarding.  Musculoskeletal: He exhibits no edema and no tenderness.  Neurological: He is alert. No cranial nerve  deficit.       Oriented to self and location. Patient cannot tell me the year.  Skin: Skin is warm and dry. No rash noted.    ED Course  Procedures (including critical care time)  Labs Reviewed  CBC - Abnormal; Notable for the following:    Hemoglobin 12.1 (*)    HCT 37.9 (*)    All other components within normal limits  COMPREHENSIVE METABOLIC PANEL - Abnormal; Notable for the following:    Glucose, Bld 104 (*)    Albumin 3.4 (*)    GFR calc non Af Amer 55 (*)    GFR calc Af Amer 64 (*)    All other components within normal limits  PROTIME-INR - Abnormal; Notable for the following:    Prothrombin Time 17.1 (*)    All other components within normal limits  DIFFERENTIAL  URINALYSIS, ROUTINE W REFLEX MICROSCOPIC  POCT I-STAT TROPONIN I  GLUCOSE, CAPILLARY  I-STAT TROPONIN I  POCT CBG MONITORING   Dg Chest 2 View  11/16/2011  *RADIOLOGY REPORT*  Clinical Data: Recent pneumonia and altered mental status.  CHEST - 2 VIEW  Comparison: 11/12/2011 and 10/22/2010  Findings:  Two views of the chest were obtained.  Again noted is enlargement of the cardiac silhouette.  There are subtle densities along the right mid lung and difficult to exclude subtle airspace disease.  Findings are similar to the recent comparison examination.  No significant pleural fluid.  IMPRESSION: Stable chest radiograph findings.  Subtle densities in the right lung are nonspecific.  Difficult to exclude a subtle infectious etiology.  Stable cardiomegaly.  Original Report Authenticated By: Richarda Overlie, M.D.   Ct Head Wo Contrast  11/16/2011  *RADIOLOGY REPORT*  Clinical Data: L O C  CT HEAD WITHOUT CONTRAST  Technique:  Contiguous axial images were obtained from the base of the skull through the vertex without contrast.  Comparison: 12/17/2009 and 09/06/2009  Findings: The patient's head is tilted anteriorly due to  hyperflexion or kyphosis.  No skull fracture is noted.  Visualized mastoid air cells are unremarkable.   There is mucosal thickening or mucous retention cyst in the right sphenoid sinus. Mild mucosal thickening posterior aspect of the right maxillary sinus. Stable cerebral atrophy. Ventricular size is stable from prior exam.  Stable periventricular and subcortical white matter decreased attenuation probable due to chronic small vessel ischemic changes.  No acute infarction.  No mass lesion is noted on this unenhanced scan.  IMPRESSION: No acute intracranial abnormality.  Stable atrophy and chronic white matter disease.  Sinuses disease as described above.  Original Report Authenticated By: Natasha Mead, M.D.     1. Change in mental status.    MDM  Patient was evaluated by myself. We have very limited report  and family was unavailable upon his arrival. Chief complaint was noted only that the patient had possibly lost consciousness and it was acting out of it. Patient had no obvious neurologic abnormalities compared to prior reported exams. Workup for altered mental status was initiated. This included a head CT, basic labs, EKG and troponin, chest x-ray, and urinalysis.  These were all unremarkable. I spoke over the phone with the patient's daughter, Deontrae Drinkard.  Apparently when patient was initially admitted to the hospital he had the ability to walk and stand on his own. At discharge yesterday the patient was unable to do the things himself. Patient also has had an episode on the way home as well as today where the family was unable to get him to speak to them. They were also concerned she may be staring into space. Patient is planning on following up with outpatient neurology. I conveyed the results of today's testing. I spoke with Dr. Mahala Menghini, the patient's discharging physician yesterday. I did confirm that the patient was supposed to be discharged on azithromycin as discharge summary had mentioned to Levaquin. He confirmed that this was the case. We agreed that the patient did not require additional  admission today. Family was comfortable with caring for the patient home but did sound like it required home care physical therapy. Dr. Allen Norris was happy to order this in the computer for me. French Ana our social worker was able to help make arrangements.  Family notified me that the patient had only received one day of antibiotics with azithromycin in the hospital. They were discharged a prescription for 2 pills. The patient received his second dose here in the emergency department today. Meaning the patient would require one additional pill of azithromycin. A prescription will be written for this today. Patient requires no other prescriptions. He was discharged in good condition and has followup with home health care arranged.        Cyndra Numbers, MD 11/16/11 [phone]

## 2011-11-16 NOTE — ED Notes (Signed)
Gave old and new ECG to Dr. Alto Denver after I performed. 9:04 am JG

## 2011-11-16 NOTE — ED Notes (Signed)
Per ems- pt was d/c last night after being treated with pneumonia. Family told ems pt was not acting right and had an episode this am where he"went out of it." Pt voicing no complaints.

## 2011-11-16 NOTE — Progress Notes (Signed)
ED CSW NOTE: 1100  CSW received call from Dr. Alto Denver requesting assistance with arranging HHC, Pt discharged from [facility] 24 hours earlier and [facility] request was not communicated. Pt at present does not meet inpt criteria. CSW met with pts Legal Guardian/Daughter Johannes Everage # [phone] to address referral. Pts daughter has requested [facility] and is with the understanding start of care may be more then 24 hours. [name] confirmed pt is with 24/7 care via pts son and prvt caregiver having adequate resources to meet his needs. CSW confirmed pts payor source and demographics as noted on face sheet and informed [name] a member of the [employer]. Team will call her once confirmation is noted for the contracted HHC agency. [name] expressed appreciation for the support/information as is accepting for pt to discharge back home today.  CSW spoke to Dr. Alto Denver and prior DC physician Dr. Gloriann Loan who has completed the [facility] orders. CSW collaborated with [facility]. re: above request who will address final task of referral and inform pts family. No further CSW interventions identified.   Dionne Milo MSW, LCSWA [facility] Emergency Dept. Weekend/Social Worker [phone]

## 2011-11-16 NOTE — Progress Notes (Signed)
CARE MANAGEMENT NOTE 11/16/2011  Patient:  Ricky [name], Ricky [name]   Account Number:  [id number]  Date Initiated:  11/16/2011  Documentation initiated by:  [facility]  Subjective/Objective Assessment:   pnemonia, confusion     Action/Plan:   lives at home with daughter, Jarae Panas   Anticipated DC Date:  11/16/2011   Anticipated DC Plan:  HOME W HOME HEALTH SERVICES  In-house referral  Clinical Social Worker      DC Planning Services  CM consult      [facility] Choice  HOME HEALTH   Choice offered to / List presented to:  C-2 HC POA / Guardian        HH arranged  HH-2 PT  HH-1 RN  HH-3 OT  HH-4 NURSE'S AIDE      HH agency  [employer]   Status of service:  Completed, signed off Medicare Important Message given?   (If response is "NO", the following Medicare IM given date fields will be blank) Date Medicare IM given:   Date Additional Medicare IM given:    Discharge Disposition:  HOME W HOME HEALTH SERVICES  Per UR Regulation:    Comments:  11/16/2011 1720 SW discussed with family HH and they requested [place]. Faxed orders, F2F, d/c summary and facesheet to [place] for [facility]. Isidoro Donning RN CCM Case Mgmt phone [phone]

## 2011-11-16 NOTE — ED Notes (Signed)
Pt d/c home with family. NAD noted at this time. Home health appt was made with social work assistance.

## 2011-11-17 ENCOUNTER — Encounter (HOSPITAL_COMMUNITY): Payer: Self-pay | Admitting: Physician Assistant

## 2011-11-17 ENCOUNTER — Emergency Department (HOSPITAL_COMMUNITY)
Admission: EM | Admit: 2011-11-17 | Discharge: 2011-11-17 | Disposition: A | Discharge status: Home / Self Care | Payer: Medicare PPO | Attending: Emergency Medicine | Admitting: Emergency Medicine

## 2011-11-17 ENCOUNTER — Other Ambulatory Visit: Payer: Self-pay

## 2011-11-17 DIAGNOSIS — Z7982 Long term (current) use of aspirin: Secondary | ICD-10-CM | POA: Insufficient documentation

## 2011-11-17 DIAGNOSIS — I10 Essential (primary) hypertension: Secondary | ICD-10-CM | POA: Insufficient documentation

## 2011-11-17 DIAGNOSIS — R404 Transient alteration of awareness: Secondary | ICD-10-CM | POA: Insufficient documentation

## 2011-11-17 DIAGNOSIS — G20A1 Parkinson's disease without dyskinesia, without mention of fluctuations: Secondary | ICD-10-CM | POA: Insufficient documentation

## 2011-11-17 DIAGNOSIS — M129 Arthropathy, unspecified: Secondary | ICD-10-CM | POA: Insufficient documentation

## 2011-11-17 DIAGNOSIS — R001 Bradycardia, unspecified: Secondary | ICD-10-CM

## 2011-11-17 DIAGNOSIS — Z79899 Other long term (current) drug therapy: Secondary | ICD-10-CM | POA: Insufficient documentation

## 2011-11-17 DIAGNOSIS — F0280 Dementia in other diseases classified elsewhere without behavioral disturbance: Secondary | ICD-10-CM | POA: Insufficient documentation

## 2011-11-17 DIAGNOSIS — R55 Syncope and collapse: Secondary | ICD-10-CM

## 2011-11-17 DIAGNOSIS — I498 Other specified cardiac arrhythmias: Secondary | ICD-10-CM | POA: Insufficient documentation

## 2011-11-17 DIAGNOSIS — R4182 Altered mental status, unspecified: Secondary | ICD-10-CM | POA: Insufficient documentation

## 2011-11-17 DIAGNOSIS — R079 Chest pain, unspecified: Secondary | ICD-10-CM | POA: Insufficient documentation

## 2011-11-17 DIAGNOSIS — G20C Parkinsonism, unspecified: Secondary | ICD-10-CM | POA: Insufficient documentation

## 2011-11-17 DIAGNOSIS — G309 Alzheimer's disease, unspecified: Secondary | ICD-10-CM | POA: Insufficient documentation

## 2011-11-17 DIAGNOSIS — R5383 Other fatigue: Secondary | ICD-10-CM | POA: Insufficient documentation

## 2011-11-17 DIAGNOSIS — M810 Age-related osteoporosis without current pathological fracture: Secondary | ICD-10-CM | POA: Insufficient documentation

## 2011-11-17 DIAGNOSIS — R5381 Other malaise: Secondary | ICD-10-CM | POA: Insufficient documentation

## 2011-11-17 DIAGNOSIS — G20 Parkinson's disease: Secondary | ICD-10-CM | POA: Insufficient documentation

## 2011-11-17 HISTORY — DX: Syncope and collapse: R55

## 2011-11-17 LAB — COMPREHENSIVE METABOLIC PANEL
ALT: 15 U/L (ref 0–53)
AST: 17 U/L (ref 0–37)
CO2: 26 mEq/L (ref 19–32)
Calcium: 9.3 mg/dL (ref 8.4–10.5)
Chloride: 103 mEq/L (ref 96–112)
Creatinine, Ser: 0.89 mg/dL (ref 0.50–1.35)
GFR calc Af Amer: 83 mL/min — ABNORMAL LOW (ref >90)
GFR calc non Af Amer: 72 mL/min — ABNORMAL LOW (ref >90)
Glucose, Bld: 97 mg/dL (ref 70–99)
Total Bilirubin: 0.5 mg/dL (ref 0.3–1.2)

## 2011-11-17 LAB — CBC
Hemoglobin: 12 g/dL — ABNORMAL LOW (ref 13.0–17.0)
MCH: 28.4 pg (ref 26.0–34.0)
MCHC: 32.6 g/dL (ref 30.0–36.0)
Platelets: 145 10*3/uL — ABNORMAL LOW (ref 150–400)
RBC: 4.22 MIL/uL (ref 4.22–5.81)

## 2011-11-17 NOTE — ED Notes (Signed)
Pt placed in gown, on monitor with continuous blood pressure and pulse oximetry 

## 2011-11-17 NOTE — Consult Note (Signed)
CARDIOLOGY CONSULT NOTE   Patient ID: Ricky [name] MRN: [id number] DOB/AGE: May 20, 1917 77 y.o.  Admit date: 11/17/2011  Primary Physician   Ricky Herter, MD, MD Primary Cardiologist   PR  Reason for Consultation   Pre-& syncope  JYN:[id number] Ricky [name] is a 77 y.o. male with no history of CAD.     Per ER notes today: Pt was discharged for syncopal episodes yesterday. Has had 3 episodes in 6 days per family. Per ems pt was unresponsive on arrival. Pt was put on stretcher and opened his eyes wanting to know what was going on. EMS says vitals were normal. Rt sided weakness is pts normal but pt family says it is more pronounced.  Pt currently alone in room. Does not remember events of today but does remember he was just in the ER. He denies dizziness or syncope but does c/o weakness. He denies CP/SOB or palpitations. He is appropriate and responsive to questions and commands but does better with simpler requests.    Past Medical History  Diagnosis Date   SYNCOPE, HX OF (ICD-V12.49) Per PR/PN notes  - multiple episodes over the last 2 years. Last seen 12/11 by PR. Starting 2010   Event monitor Dec 2011 - No signs of arrhythmia, pauses or bradycardia to explain syncope 2011   BRADYCARDIA (ICD-427.89) Stable with no heart block   . IBS (irritable bowel syndrome)   . Diverticulosis   . Cataract   . OAB (overactive bladder)   . Macular degeneration   . Alzheimer disease   . Hypertension   . Arthritis   . Osteoporosis   . Parkinsonian syndrome     Past Surgical History  Procedure Date  . Esophagogastroduodenoscopy   . Eye surgery     LEFT CATARACT    No Known Allergies I have reviewed the patient's current medications. Medications Prior to Admission  Medication Sig Dispense Refill  . alendronate (FOSAMAX) 70 MG tablet Take 70 mg by mouth every 7 (seven) days. Takes on Thursdays. Take with a full glass of water on an empty stomach. Hold while in hospital      . aspirin  EC 81 MG tablet Take 1 tablet (81 mg total) by mouth daily.  30 tablet  11  . azithromycin (ZITHROMAX) 250 MG tablet Take 1 tab by mouth daily  1 each  0  . azithromycin (ZITHROMAX) 500 MG tablet Take 1 tablet (500 mg total) by mouth daily.  2 tablet  0  . donepezil (ARICEPT) 10 MG tablet Take 10 mg by mouth at bedtime.        Marland Kitchen levothyroxine (SYNTHROID, LEVOTHROID) 25 MCG tablet Take 25 mcg by mouth daily.        Marland Kitchen lisinopril (PRINIVIL,ZESTRIL) 20 MG tablet Take 20 mg by mouth daily.        . memantine (NAMENDA) 10 MG tablet Take 10 mg by mouth every evening.           History   Social History  . Marital Status: Single    Spouse Name: N/A    Number of Children: N/A  . Years of Education: N/A   Occupational History  . 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 Smoking status: Never Smoker   . Smokeless tobacco: Never Used  . Alcohol Use: No  . Drug Use: No  . Sexually Active: Not on file   Social History Narrative  . He was discharged from the hospital  on 12-29, to home with his daughter and son, [facility] care arranged as OP.     Family History  Problem Relation Age of Onset  . Cancer Mother   . Stroke Father   . Liver disease Father   . Kidney disease Brother      ROS:Pt on coumadin for Hx PE prior to last admit but it was d/c'd because of recurrent falls. His balance is poor but he denies recent falls. He does not remember any recent illnesses, fevers or chills. He admits to some aches/pains. His recall is limited. Full 14 point review of systems complete and found to be negative unless listed  above  Physical Exam: Blood pressure 129/78, pulse 68, temperature 97.6 F (36.4 C), temperature source Oral, SpO2 95.00%.   General: Well developed, well nourished, elderly african-american male, in no acute distress Head: Eyes PERRLA, No xanthomas.  Sclera ?jaundiced, head is normocephalic and atraumatic, oropharynx without edema or exudate.  Lungs:  Clear bilaterally to auscultation  Heart: HRRR S1 S2, no rub/gallop,  Murmur. pulses are 2+ & equal all 4 extrem.   Neck:  No carotid bruit.   No lymphadenopathy.  JVD. Abdomen: Bowel sounds present, abdomen soft and non-tender without masses or hernias noted. Msk:  No spine or cva tenderness. 4/5 strength, slightly weaker on the right, no joint deformities or effusions. Extremities: No clubbing or cyanosis. No edema.  Neuro: Alert and oriented X 2. Moves all extrem to command Psych:  Good affect, pleasant, responds appropriately Skin :  No rashes or lesions noted.  Labs:  Lab Results  Component Value Date   WBC 6.5 11/16/2011   HGB 12.1* 11/16/2011   HCT 37.9* 11/16/2011   MCV 89.0 11/16/2011   PLT 162 11/16/2011    Lab 11/16/11 0808  NA 140  K 3.8  CL 103  CO2 27  BUN 17  CREATININE 1.12  CALCIUM 9.4  PROT 8.0  BILITOT 0.5  ALKPHOS 77  ALT 13  AST 17  GLUCOSE 104*    TSH  Date/Time Value Range Status  11/12/2011  3:52 PM 0.648  0.350-4.500 (uIU/mL) Final     T4, Total  Date/Time Value Range Status  09/12/2009 12:55 PM 6.9  5.0-12.5 (ug/dL) Final   Vitamin M-84  Date/Time Value Range Status  11/12/2011  3:52 PM 902  211-911 (pg/mL) Final    Echo: 11-12-2011  Study Conclusions - Left ventricle: The cavity size was normal. There was mild concentric hypertrophy. Systolic function was normal. The estimated ejection fraction was in the range of 55% to 60%. Wall motion was normal; there were no regional wall motion abnormalities. Doppler parameters are consistent with abnormal left ventricular relaxation (grade 1 diastolic dysfunction). - Aortic valve: Trivial regurgitation. - Aortic root: The aortic root was uppernormal in size. - Left atrium: The atrium was moderately dilated. - Pulmonary arteries: PA peak pressure: 37mm Hg (S).    Radiology:  Dg Chest 2 View  11/16/2011  *RADIOLOGY REPORT*  Clinical Data: Recent pneumonia and altered mental status.   CHEST - 2 VIEW  Comparison: 11/12/2011 and 10/22/2010  Findings:  Two views of the chest were obtained.  Again noted is enlargement of the cardiac silhouette.  There are subtle densities along the right mid lung and difficult to exclude subtle airspace disease.  Findings are similar to the recent comparison examination.  No significant pleural fluid.   IMPRESSION: Stable chest radiograph findings.  Subtle densities in the right lung are nonspecific.  Difficult to  exclude a subtle infectious etiology.  Stable cardiomegaly.  Original Report Authenticated By: Richarda Overlie, M.D.   Ct Head Wo Contrast 11/16/2011  *RADIOLOGY REPORT*  Clinical Data: L O C  CT HEAD WITHOUT CONTRAST  Technique:  Contiguous axial images were obtained from the base of the skull through the vertex without contrast.  Comparison: 12/17/2009 and 09/06/2009  Findings: The patient's head is tilted anteriorly due to  hyperflexion or kyphosis.  No skull fracture is noted.  Visualized mastoid air cells are unremarkable.  There is mucosal thickening or mucous retention cyst in the right sphenoid sinus. Mild mucosal thickening posterior aspect of the right maxillary sinus. Stable cerebral atrophy. Ventricular size is stable from prior exam.  Stable periventricular and subcortical white matter decreased attenuation probable due to chronic small vessel ischemic changes.  No acute infarction.  No mass lesion is noted on this unenhanced scan.   IMPRESSION: No acute intracranial abnormality.  Stable atrophy and chronic white matter disease.  Sinuses disease as described above.  Original Report Authenticated By: Natasha Mead, M.D.   EKG:  SR, RBBB (seen on previous ECG) TELEMETRY: Reviewed telemetry - pt in Sinus bradycardia  ASSESSMENT AND PLAN:   The patient was seen today by Dr Daleen Squibb, the patient evaluated and the data reviewed.   1. Syncope - likely vasovagal in origin. Mr. Chilton Si is a 77 year old male with a history of multiple episodes of syncope and  presyncope over the last 2 years. He has had multiple evaluations by cardiology. His most recent evaluation was a year ago when he had event monitor that showed no significant bradycardia and no cause for his syncopal episodes. He has also been evaluated by neurology in the past. Currently, we have no cardiac cause for his syncope. He has a recent echo showing a normal ejection fraction. We will be happy to follow him if he is admitted but there is no cardiac reason to admit him at this time. Signed: Theodore Demark 11/17/2011, 1:13 PM Co-Sign MD     Valera Castle, MD 11/17/2011 3:16 PM  I have taken a history, reviewed medications, allergies, PMH, SH, FH, and reviewed ROS and examined the patient. I discussed with Dr Denton Lank. We feel that there is nothing to gain by admitting him to the hospital. Will stop Aricept and get Care Management involved.  Thomas C. Daleen Squibb, MD, [facility] Pager:  [phone]

## 2011-11-17 NOTE — ED Notes (Addendum)
Called home phone [phone], was given [phone] for Ricky [name] who is his guardian.  Per person at home number Ricky [name] and pt's son are in route to the hospital.  Ricky [name] [phone], no answer- left mess

## 2011-11-17 NOTE — ED Notes (Signed)
Spoke to main lab.  Blood was never received by lab.  There are 2 veils in the mini lab but the two that were sent to main lab are missing.

## 2011-11-17 NOTE — ED Notes (Signed)
Pt offered urinal.  Pt nto need to urinate now

## 2011-11-17 NOTE — ED Notes (Signed)
Pt was discharged for syncope episodes yesterday.  Has had 3 episodes in 6 days per family. Per ems pt was unresponsive on arrival.  Pt was put on stretcher and opened his eyes wanting to know what was going on.  EMS says vitals were normal. Rt sided weakness is pts normal but pt family says it is more pronounced.

## 2011-11-17 NOTE — ED Provider Notes (Addendum)
History     CSN: [id number]  Arrival date & time 11/17/11  1113   First MD Initiated Contact with Patient 11/17/11 1125      Chief Complaint  Patient presents with  . Loss of Consciousness    (Consider location/radiation/quality/duration/timing/severity/associated sxs/prior treatment) The history is provided by the patient.  pt w hx dementia, brought from home via ems with episode of gen weakness/unresponsiveness. Pt states he feels fine, denies any c/o. Pt denies feeling faint or dizzy, denies syncope. Denies fall or injury. No headache. No cp or sob. No palpitations. No cough or uri c/o. No fever or chills. Denies abd pain. No nvd. No gu c/o. Pt unaware of any change in medication, other than being no abx for recent possible pna. Denies any change in vision or speech. No numbness/focal weakness.   Past Medical History  Diagnosis Date  . IBS (irritable bowel syndrome)   . Diverticulosis   . Cataract   . OAB (overactive bladder)   . Macular degeneration   . Alzheimer disease   . Hypertension   . Arthritis   . Osteoporosis   . Parkinsonian syndrome     Past Surgical History  Procedure Date  . Esophagogastroduodenoscopy   . Eye surgery     LEFT CATARACT    Family History  Problem Relation Age of Onset  . Cancer Mother   . Stroke Father   . Liver disease Father   . Kidney disease Brother     History  Substance Use Topics  . Smoking status: Never Smoker   . Smokeless tobacco: Never Used  . Alcohol Use: No      Review of Systems  Constitutional: Negative for fever and chills.  HENT: Negative for neck pain.   Eyes: Negative for visual disturbance.  Respiratory: Negative for shortness of breath.   Cardiovascular: Negative for chest pain, palpitations and leg swelling.  Gastrointestinal: Negative for vomiting, abdominal pain, diarrhea, blood in stool and anal bleeding.  Genitourinary: Negative for dysuria and flank pain.  Musculoskeletal: Negative for back  pain.  Skin: Negative for rash.  Neurological: Negative for headaches.  Hematological: Does not bruise/bleed easily.  Psychiatric/Behavioral: Negative for agitation.    Allergies  Review of patient's allergies indicates no known allergies.  Home Medications   Current Outpatient Rx  Name Route Sig Dispense Refill  . ALENDRONATE SODIUM 70 MG PO TABS Oral Take 70 mg by mouth every 7 (seven) days. Takes on Thursdays. Take with a full glass of water on an empty stomach. Hold while in hospital    . ASPIRIN EC 81 MG PO TBEC Oral Take 1 tablet (81 mg total) by mouth daily. 30 tablet 11  . AZITHROMYCIN 250 MG PO TABS  Take 1 tab by mouth daily 1 each 0  . AZITHROMYCIN 500 MG PO TABS Oral Take 1 tablet (500 mg total) by mouth daily. 2 tablet 0  . DONEPEZIL HCL 10 MG PO TABS Oral Take 10 mg by mouth at bedtime.      Marland Kitchen LEVOTHYROXINE SODIUM 25 MCG PO TABS Oral Take 25 mcg by mouth daily.      Marland Kitchen LISINOPRIL 20 MG PO TABS Oral Take 20 mg by mouth daily.      Marland Kitchen MEMANTINE HCL 10 MG PO TABS Oral Take 10 mg by mouth every evening.        BP 157/74  Pulse 53  Temp(Src) 97.6 F (36.4 C) (Oral)  SpO2 95%  Physical Exam  Nursing note and  vitals reviewed. Constitutional: He appears well-developed and well-nourished. No distress.  HENT:  Head: Atraumatic.  Eyes: Pupils are equal, round, and reactive to light.  Neck: Neck supple. No tracheal deviation present. No thyromegaly present.       No bruit  Cardiovascular: Regular rhythm, normal heart sounds and intact distal pulses.  Exam reveals no gallop and no friction rub.   No murmur heard. Pulmonary/Chest: Effort normal and breath sounds normal. No accessory muscle usage. No respiratory distress. He has no rales.  Abdominal: Soft. He exhibits no distension and no mass. There is no tenderness.  Musculoskeletal: Normal range of motion. He exhibits no edema and no tenderness.  Neurological: He is alert.       Alert, oriented to person/place. No facial  droop. No pronator drift. Follows commands. Motor intact bil.   Skin: Skin is warm and dry.  Psychiatric: He has a normal mood and affect.    ED Course  Procedures (including critical care time)   Labs Reviewed  COMPREHENSIVE METABOLIC PANEL  CBC  URINALYSIS, ROUTINE W REFLEX MICROSCOPIC    Results for orders placed during the hospital encounter of 11/17/11  COMPREHENSIVE METABOLIC PANEL      Component Value Range   Sodium 139  135 - 145 (mEq/L)   Potassium 4.1  3.5 - 5.1 (mEq/L)   Chloride 103  96 - 112 (mEq/L)   CO2 26  19 - 32 (mEq/L)   Glucose, Bld 97  70 - 99 (mg/dL)   BUN 14  6 - 23 (mg/dL)   Creatinine, Ser 0.45  0.50 - 1.35 (mg/dL)   Calcium 9.3  8.4 - 40.9 (mg/dL)   Total Protein 7.9  6.0 - 8.3 (g/dL)   Albumin 3.5  3.5 - 5.2 (g/dL)   AST 17  0 - 37 (U/L)   ALT 15  0 - 53 (U/L)   Alkaline Phosphatase 75  39 - 117 (U/L)   Total Bilirubin 0.5  0.3 - 1.2 (mg/dL)   GFR calc non Af Amer 72 (*) >90 (mL/min)   GFR calc Af Amer 83 (*) >90 (mL/min)  CBC      Component Value Range   WBC 6.7  4.0 - 10.5 (K/uL)   RBC 4.22  4.22 - 5.81 (MIL/uL)   Hemoglobin 12.0 (*) 13.0 - 17.0 (g/dL)   HCT 81.1 (*) 91.4 - 52.0 (%)   MCV 87.2  78.0 - 100.0 (fL)   MCH 28.4  26.0 - 34.0 (pg)   MCHC 32.6  30.0 - 36.0 (g/dL)   RDW 78.2  95.6 - 21.3 (%)   Platelets 145 (*) 150 - 400 (K/uL)   Dg Chest 2 View  11/16/2011  *RADIOLOGY REPORT*  Clinical Data: Recent pneumonia and altered mental status.  CHEST - 2 VIEW  Comparison: 11/12/2011 and 10/22/2010  Findings:  Two views of the chest were obtained.  Again noted is enlargement of the cardiac silhouette.  There are subtle densities along the right mid lung and difficult to exclude subtle airspace disease.  Findings are similar to the recent comparison examination.  No significant pleural fluid.  IMPRESSION: Stable chest radiograph findings.  Subtle densities in the right lung are nonspecific.  Difficult to exclude a subtle infectious etiology.   Stable cardiomegaly.  Original Report Authenticated By: Richarda Overlie, M.D.   Dg Chest 2 View  11/12/2011  *RADIOLOGY REPORT*  Clinical Data: Chest pain weakness  CHEST - 2 VIEW  Comparison: October 22, 2010  Findings: Cardiomegaly is unchanged.  The pulmonary vasculature and mediastinum are within normal limits.  There is added opacity within the right mid lung adjacent to the minor fissure.  IMPRESSION: Subtle added opacity in the right upper lobe adjacent to the minor fissure is present which is worrisome for a developing pneumonia.  Cardiomegaly, unchanged.  Original Report Authenticated By: Brandon Melnick, M.D.   Ct Head Wo Contrast  11/16/2011  *RADIOLOGY REPORT*  Clinical Data: L O C  CT HEAD WITHOUT CONTRAST  Technique:  Contiguous axial images were obtained from the base of the skull through the vertex without contrast.  Comparison: 12/17/2009 and 09/06/2009  Findings: The patient's head is tilted anteriorly due to  hyperflexion or kyphosis.  No skull fracture is noted.  Visualized mastoid air cells are unremarkable.  There is mucosal thickening or mucous retention cyst in the right sphenoid sinus. Mild mucosal thickening posterior aspect of the right maxillary sinus. Stable cerebral atrophy. Ventricular size is stable from prior exam.  Stable periventricular and subcortical white matter decreased attenuation probable due to chronic small vessel ischemic changes.  No acute infarction.  No mass lesion is noted on this unenhanced scan.  IMPRESSION: No acute intracranial abnormality.  Stable atrophy and chronic white matter disease.  Sinuses disease as described above.  Original Report Authenticated By: Natasha Mead, M.D.         MDM  Reviewed recent labs and ct within past day. On review recent d/c summary, hr noted as low as 40 during that hospitalization. Hr in ed now 50. ?whether bradycardia related to recent symptoms.     Date: 11/17/2011  Rate: 53  Rhythm: sinus bradycardia  QRS Axis:  normal  Intervals: normal  ST/T Wave abnormalities: nonspecific ST changes  Conduction Disutrbances:right bundle branch block  Narrative Interpretation:   Old EKG Reviewed: unchanged  Will call  card to see.  Dr Daleen Squibb from [facility] has seen pt - indicates long hx bradycardia, they do not recommend pacemaker or any other intervention at this point. They do recommend stopping aricept and rec d/c back to home.   Pt remains awake and alert throughout ed stay, denies any new c/o. No faintness or dizziness.     Suzi Roots, MD 11/17/11 1523  Suzi Roots, MD 11/17/11 [phone]

## 2011-11-17 NOTE — Consult Note (Signed)
I have taken a history, reviewed medications, allergies, PMH, SH, FH, and reviewed ROS and examined the patient.  I agree with the assessment and plan.  Pavneet Markwood C. Daleen Squibb, MD, [facility] Pager:  [phone]

## 2011-11-17 NOTE — ED Notes (Signed)
Patient attempted to use urinal could not provide urine sample at this time.

## 2011-11-17 NOTE — ED Notes (Signed)
Assisted in the Pt trying to urinate.  Pt said he could not go.

## 2011-11-17 NOTE — ED Notes (Signed)
Family at bedside with EDP speaking to patient and family.

## 2011-11-18 LAB — CULTURE, BLOOD (ROUTINE X 2)
Culture  Setup Time: 201212262343
Culture: NO GROWTH

## 2011-11-21 ENCOUNTER — Telehealth: Payer: Self-pay | Admitting: Internal Medicine

## 2011-11-21 ENCOUNTER — Ambulatory Visit (INDEPENDENT_AMBULATORY_CARE_PROVIDER_SITE_OTHER): Payer: Medicare PPO | Admitting: Family Medicine

## 2011-11-21 ENCOUNTER — Encounter: Payer: Self-pay | Admitting: Family Medicine

## 2011-11-21 VITALS — BP 120/70 | HR 56 | Wt 178.0 lb

## 2011-11-21 DIAGNOSIS — R627 Adult failure to thrive: Secondary | ICD-10-CM

## 2011-11-21 NOTE — Telephone Encounter (Signed)
Lisa informed

## 2011-11-21 NOTE — Telephone Encounter (Signed)
Ricky [name] states that her father was going to physical therapy at [place]  and is not longer available since [place] goals have changed and [facility] is not no longer in network. would like somewhere else to be referred too. please call and talk to lisa about this concern please

## 2011-11-21 NOTE — Telephone Encounter (Signed)
Call her and let her known we will try to help get the home health agency out there beginning of the week

## 2011-11-21 NOTE — Progress Notes (Signed)
  Subjective:    Patient ID: Ricky [name], male    DOB: [birth year], 78 y.o.   MRN: [id number]  HPI He is here with his 2 adult children. He has been to the emergency room 3 times within the last week. He has had syncopal episodes. He was evaluated in the ER and cardiology was also consulted. Although he has evidence of bradycardia he apparently is not a candidate for a pacemaker.   Review of Systems     Objective:   Physical Exam Alert and in no distress. Cardiac exam shows a slight bradycardia. Lungs are clear to auscultation.       Assessment & Plan:  History of syncopal episodes. Failure to thrive. Discussed various options. Presently he is getting home physical therapy. If he improves his physical abilities, he might be able to progress to going to [employer]. I explained to his children that he essentially tends to slow down and that recovery is highly unlikely. Did recommend that they stop the Aricept.

## 2011-11-24 ENCOUNTER — Telehealth: Payer: Self-pay

## 2011-11-24 MED ORDER — AZITHROMYCIN 250 MG PO TABS: ORAL_TABLET | ORAL | Status: DC

## 2011-11-24 NOTE — Telephone Encounter (Signed)
[name] called and asked for another round of antibiotics for her dad and brother Demarco Bacci they are both still spitting up green and yellow please advise

## 2011-12-03 ENCOUNTER — Other Ambulatory Visit: Payer: Self-pay | Admitting: Family Medicine

## 2011-12-04 ENCOUNTER — Ambulatory Visit: Payer: Medicare Other | Attending: Neurology | Admitting: Physical Therapy

## 2011-12-04 DIAGNOSIS — R269 Unspecified abnormalities of gait and mobility: Secondary | ICD-10-CM | POA: Insufficient documentation

## 2011-12-04 DIAGNOSIS — M6281 Muscle weakness (generalized): Secondary | ICD-10-CM | POA: Insufficient documentation

## 2011-12-04 DIAGNOSIS — IMO0001 Reserved for inherently not codable concepts without codable children: Secondary | ICD-10-CM | POA: Insufficient documentation

## 2011-12-04 DIAGNOSIS — R293 Abnormal posture: Secondary | ICD-10-CM | POA: Insufficient documentation

## 2011-12-08 ENCOUNTER — Emergency Department (INDEPENDENT_AMBULATORY_CARE_PROVIDER_SITE_OTHER)
Admission: EM | Admit: 2011-12-08 | Discharge: 2011-12-08 | Disposition: A | Discharge status: Home / Self Care | Payer: BC Managed Care – PPO | Source: Home / Self Care | Attending: Family Medicine | Admitting: Family Medicine

## 2011-12-08 ENCOUNTER — Emergency Department (INDEPENDENT_AMBULATORY_CARE_PROVIDER_SITE_OTHER): Payer: BC Managed Care – PPO

## 2011-12-08 ENCOUNTER — Ambulatory Visit: Payer: Medicare Other | Admitting: Physical Therapy

## 2011-12-08 ENCOUNTER — Encounter (HOSPITAL_COMMUNITY): Payer: Self-pay | Admitting: Emergency Medicine

## 2011-12-08 DIAGNOSIS — S5000XA Contusion of unspecified elbow, initial encounter: Secondary | ICD-10-CM

## 2011-12-08 DIAGNOSIS — S60229A Contusion of unspecified hand, initial encounter: Secondary | ICD-10-CM

## 2011-12-08 NOTE — ED Provider Notes (Signed)
History     CSN: [id number]  Arrival date & time 12/08/11  0808   First MD Initiated Contact with Patient 12/08/11 [phone]      Chief Complaint  Patient presents with  . Fall  . Finger Injury    (Consider location/radiation/quality/duration/timing/severity/associated sxs/prior treatment) HPI Comments: Mr. Thone is brought in by his son for evaluation of bruising and swelling over his LEFT hand, specifically his second MCP joint of his LEFT hand, and bruising over his LEFT elbow. 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 He denies striking his head.   Patient is a 78 y.o. male presenting with fall. The history is provided by the patient.  Fall The accident occurred more than 2 days ago. The fall occurred while walking. He landed on carpet. There was no blood loss. The point of impact was the left elbow and left wrist. The patient is experiencing no pain. He was ambulatory at the scene. There was no entrapment after the fall. There was no drug use involved in the accident. There was no alcohol use involved in the accident.    Past Medical History  Diagnosis Date  . IBS (irritable bowel syndrome)   . Diverticulosis   . Cataract   . OAB (overactive bladder)   . Macular degeneration   . Alzheimer disease   . Hypertension   . Arthritis   . Osteoporosis   . Parkinsonian syndrome   . Syncope, vasovagal     Past Surgical History  Procedure Date  . Esophagogastroduodenoscopy   . Eye surgery     LEFT CATARACT    Family History  Problem Relation Age of Onset  . Cancer Mother   . Stroke Father   . Liver disease Father   . Kidney disease Brother     History  Substance Use Topics  . Smoking status: Never Smoker   . Smokeless tobacco: Never Used  . Alcohol Use: No      Review of Systems  Constitutional: Negative.   HENT: Negative.   Eyes: Negative.   Respiratory: Negative.   Cardiovascular: Negative.   Gastrointestinal: Negative.     Genitourinary: Negative.   Musculoskeletal: Positive for joint swelling.  Skin: Negative.   Neurological: Negative.     Allergies  Review of patient's allergies indicates no known allergies.  Home Medications   Current Outpatient Rx  Name Route Sig Dispense Refill  . ALENDRONATE SODIUM 70 MG PO TABS Oral Take 70 mg by mouth every 7 (seven) days. Takes on Thursdays. Take with a full glass of water on an empty stomach. Hold while in hospital    . ASPIRIN EC 81 MG PO TBEC Oral Take 1 tablet (81 mg total) by mouth daily. 30 tablet 11  . CALCIUM CARBONATE 1250 MG PO TABS Oral Take 1 tablet by mouth daily.      Marland Kitchen LEVOTHYROXINE SODIUM 25 MCG PO TABS  TAKE ONE TABLET BY MOUTH DAILY 90 tablet PRN  . LISINOPRIL 20 MG PO TABS Oral Take 20 mg by mouth daily.      Marland Kitchen MEMANTINE HCL 10 MG PO TABS Oral Take 10 mg by mouth every evening.      . ICAPS AREDS FORMULA PO Oral Take by mouth.      . MULTI-VITAMIN/MINERALS PO TABS Oral Take 1 tablet by mouth daily.      Marland Kitchen PSEUDOEPHEDRINE-GUAIFENESIN ER 60-600 MG PO TB12 Oral Take 1 tablet by mouth every 12 (twelve) hours.      . ACETAMINOPHEN  ER 650 MG PO TBCR Oral Take 650 mg by mouth 2 (two) times daily.      . AZITHROMYCIN 250 MG PO TABS  As directed 6 each 0  . LORATADINE 10 MG PO TABS Oral Take 10 mg by mouth daily.      Marland Kitchen PSEUDOEPHEDRINE-GUAIFENESIN 30-100 MG/5ML PO SYRP Oral Take 5 mLs by mouth every 4 (four) hours as needed.        BP 172/84  Pulse 58  Temp(Src) 98.7 F (37.1 C) (Oral)  Resp 16  SpO2 96%  Physical Exam  Nursing note and vitals reviewed. Constitutional: He is oriented to person, place, and time. He appears well-developed and well-nourished.  HENT:  Head: Normocephalic and atraumatic.  Eyes: EOM are normal.  Neck: Normal range of motion.  Pulmonary/Chest: Effort normal.  Musculoskeletal: Normal range of motion.       Hands: Neurological: He is alert and oriented to person, place, and time.  Skin: Skin is warm and dry.  Bruising noted.     Psychiatric: His behavior is normal.    ED Course  Procedures (including critical care time)  Labs Reviewed - No data to display Dg Elbow Complete Left  12/08/2011  *RADIOLOGY REPORT*  Clinical Data: Fall.  Elbow pain.  LEFT ELBOW - COMPLETE 3+ VIEW  Comparison: None.  Findings: No evidence for fracture.  No subluxation or dislocation. No fat pad elevation to suggest joint effusion.  IMPRESSION: No acute bony findings.  Original Report Authenticated By: ERIC A. MANSELL, M.D.   Dg Hand Complete Left  12/08/2011  *RADIOLOGY REPORT*  Clinical Data: Pain post fall  LEFT HAND - COMPLETE 3+ VIEW  Comparison:  None  Findings:  Three views of the left hand submitted.  No acute fracture or subluxation.  Mild degenerative changes first carpal metacarpal joint. Narrowing of radiocarpal joint.  IMPRESSION: No acute fracture or subluxation.  Mild degenerative changes first carpal metacarpal joint.  Original Report Authenticated By: Natasha Mead, M.D.     1. Hand contusion   2. Elbow contusion       MDM  Xrays were reviewed by radiologist and myself; no evidence of acute fracture or dislocation in either LEFT hand or LEFT elbow        Richardo Priest, MD 12/08/11 [phone]

## 2011-12-08 NOTE — ED Notes (Signed)
SON BRINGS DAD IN WITH C/O LEFT HAND KNUCKLE SWELLING AND BRUISING TO POSTERIOR PINKY FINGER S/P LAST THURS.PT STATES HE TRIPPED OVER FEET AND FELL ON WHOLE L SIDE OF BODY BUT DENIES LOC OR HEAD INJURY.PAIN ONLY WITH BENDING.PT HAS HX PARKINSON'S DISEASE WITH SLOW MOVEMENT

## 2011-12-10 ENCOUNTER — Ambulatory Visit: Payer: Medicare Other | Admitting: Physical Therapy

## 2011-12-11 ENCOUNTER — Ambulatory Visit: Payer: Medicare Other | Admitting: *Deleted

## 2011-12-15 ENCOUNTER — Ambulatory Visit: Payer: Medicare Other | Admitting: Physical Therapy

## 2011-12-17 ENCOUNTER — Ambulatory Visit: Payer: Medicare Other | Admitting: Physical Therapy

## 2011-12-19 ENCOUNTER — Encounter: Payer: Medicare PPO | Admitting: Physical Therapy

## 2011-12-22 ENCOUNTER — Ambulatory Visit: Payer: Medicare Other | Attending: Neurology | Admitting: Physical Therapy

## 2011-12-22 DIAGNOSIS — M6281 Muscle weakness (generalized): Secondary | ICD-10-CM | POA: Insufficient documentation

## 2011-12-22 DIAGNOSIS — R293 Abnormal posture: Secondary | ICD-10-CM | POA: Insufficient documentation

## 2011-12-22 DIAGNOSIS — R269 Unspecified abnormalities of gait and mobility: Secondary | ICD-10-CM | POA: Insufficient documentation

## 2011-12-22 DIAGNOSIS — IMO0001 Reserved for inherently not codable concepts without codable children: Secondary | ICD-10-CM | POA: Insufficient documentation

## 2011-12-24 ENCOUNTER — Ambulatory Visit: Payer: Medicare Other | Admitting: Physical Therapy

## 2011-12-26 ENCOUNTER — Ambulatory Visit: Payer: Medicare Other | Admitting: Physical Therapy

## 2011-12-29 ENCOUNTER — Encounter: Payer: Medicare PPO | Admitting: *Deleted

## 2011-12-30 ENCOUNTER — Ambulatory Visit: Payer: Medicare Other | Admitting: *Deleted

## 2011-12-31 ENCOUNTER — Ambulatory Visit: Payer: Medicare Other | Admitting: Physical Therapy

## 2012-01-02 ENCOUNTER — Other Ambulatory Visit: Payer: Self-pay | Admitting: Family Medicine

## 2012-01-02 ENCOUNTER — Ambulatory Visit: Payer: Medicare Other | Admitting: Physical Therapy

## 2012-01-08 ENCOUNTER — Telehealth: Payer: Self-pay | Admitting: Family Medicine

## 2012-01-08 NOTE — Telephone Encounter (Signed)
Left message word for word  

## 2012-01-08 NOTE — Telephone Encounter (Signed)
I need the information from [facility] neuro before I can set up a PT referral

## 2012-01-27 ENCOUNTER — Ambulatory Visit (INDEPENDENT_AMBULATORY_CARE_PROVIDER_SITE_OTHER): Payer: Medicare Other | Admitting: Family Medicine

## 2012-01-27 VITALS — BP 120/72 | HR 65 | Wt 173.0 lb

## 2012-01-27 DIAGNOSIS — D1739 Benign lipomatous neoplasm of skin and subcutaneous tissue of other sites: Secondary | ICD-10-CM

## 2012-01-27 DIAGNOSIS — D1720 Benign lipomatous neoplasm of skin and subcutaneous tissue of unspecified limb: Secondary | ICD-10-CM

## 2012-01-27 NOTE — Progress Notes (Signed)
  Subjective:    Patient ID: Ricky [name], male    DOB: 1917/03/22, 78 y.o.   MRN: [id number]  HPI He is here for evaluation of a lesion present on his left thigh. It has been there for several years but apparently is now causing some discomfort.   Review of Systems     Objective:   Physical Exam A 5 x 5 cm mobile smooth round lesion is noted on the left thigh. It is nontender to palpation      Assessment & Plan:   1. Lipoma of thigh    I reassured him and his son that no therapy needs to be contemplated concerning this.

## 2012-02-02 ENCOUNTER — Other Ambulatory Visit: Payer: Self-pay | Admitting: Family Medicine

## 2012-02-16 ENCOUNTER — Encounter: Payer: Self-pay | Admitting: Family Medicine

## 2012-02-16 ENCOUNTER — Ambulatory Visit (INDEPENDENT_AMBULATORY_CARE_PROVIDER_SITE_OTHER): Payer: Medicare Other | Admitting: Family Medicine

## 2012-02-16 ENCOUNTER — Telehealth: Payer: Self-pay | Admitting: Family Medicine

## 2012-02-16 DIAGNOSIS — K5900 Constipation, unspecified: Secondary | ICD-10-CM

## 2012-02-16 DIAGNOSIS — R05 Cough: Secondary | ICD-10-CM

## 2012-02-16 NOTE — Progress Notes (Signed)
  Subjective:    Patient ID: Ricky [name], male    DOB: 1916/12/20, 78 y.o.   MRN: [id number]  HPI He is brought in by his son for evaluation of a one-week history of a slight cough but no congestion, shortness of breath, fever. He also has had difficulty recently with constipation. His son has had to manually extract stool. He has used milk of magnesia as well as a bulk laxative without much success.  Review of Systems     Objective:   Physical Exam alert and in no distress. Tympanic membranes and canals are normal. Throat is clear. Tonsils are normal. Neck is supple without adenopathy or thyromegaly. Cardiac exam shows a regular sinus rhythm without murmurs or gallops. Lungs are clear to auscultation.        Assessment & Plan:   1. Constipation   2. Cough    recommending using MiraLax to help with the constipation. No therapy for the cough however if this gets worse, I have asked his son to call for possible antibiotic.

## 2012-02-16 NOTE — Patient Instructions (Signed)
Use MiraLax to help with the ball problem. If the coughing gets worse, call me

## 2012-02-17 ENCOUNTER — Emergency Department (HOSPITAL_COMMUNITY): Payer: Medicare Other

## 2012-02-17 ENCOUNTER — Other Ambulatory Visit: Payer: Self-pay

## 2012-02-17 ENCOUNTER — Telehealth: Payer: Self-pay | Admitting: Internal Medicine

## 2012-02-17 ENCOUNTER — Inpatient Hospital Stay (HOSPITAL_COMMUNITY)
Admission: EM | Admit: 2012-02-17 | Discharge: 2012-02-23 | DRG: 690 | Disposition: A | Discharge status: Skilled Nursing Facility | Payer: Medicare Other | Attending: Family Medicine | Admitting: Family Medicine

## 2012-02-17 ENCOUNTER — Encounter (HOSPITAL_COMMUNITY): Payer: Self-pay | Admitting: Emergency Medicine

## 2012-02-17 DIAGNOSIS — H3530 Unspecified macular degeneration: Secondary | ICD-10-CM | POA: Diagnosis present

## 2012-02-17 DIAGNOSIS — I10 Essential (primary) hypertension: Secondary | ICD-10-CM | POA: Diagnosis present

## 2012-02-17 DIAGNOSIS — G20 Parkinson's disease: Secondary | ICD-10-CM | POA: Diagnosis present

## 2012-02-17 DIAGNOSIS — D72829 Elevated white blood cell count, unspecified: Secondary | ICD-10-CM | POA: Diagnosis present

## 2012-02-17 DIAGNOSIS — R32 Unspecified urinary incontinence: Secondary | ICD-10-CM | POA: Diagnosis present

## 2012-02-17 DIAGNOSIS — N390 Urinary tract infection, site not specified: Principal | ICD-10-CM

## 2012-02-17 DIAGNOSIS — N318 Other neuromuscular dysfunction of bladder: Secondary | ICD-10-CM | POA: Diagnosis present

## 2012-02-17 DIAGNOSIS — R55 Syncope and collapse: Secondary | ICD-10-CM | POA: Diagnosis present

## 2012-02-17 DIAGNOSIS — F0280 Dementia in other diseases classified elsewhere without behavioral disturbance: Secondary | ICD-10-CM | POA: Diagnosis present

## 2012-02-17 DIAGNOSIS — Z79899 Other long term (current) drug therapy: Secondary | ICD-10-CM

## 2012-02-17 DIAGNOSIS — M810 Age-related osteoporosis without current pathological fracture: Secondary | ICD-10-CM | POA: Diagnosis present

## 2012-02-17 DIAGNOSIS — N179 Acute kidney failure, unspecified: Secondary | ICD-10-CM | POA: Diagnosis not present

## 2012-02-17 DIAGNOSIS — M129 Arthropathy, unspecified: Secondary | ICD-10-CM | POA: Diagnosis present

## 2012-02-17 DIAGNOSIS — K5909 Other constipation: Secondary | ICD-10-CM

## 2012-02-17 DIAGNOSIS — Z7982 Long term (current) use of aspirin: Secondary | ICD-10-CM

## 2012-02-17 DIAGNOSIS — R531 Weakness: Secondary | ICD-10-CM

## 2012-02-17 DIAGNOSIS — R5381 Other malaise: Secondary | ICD-10-CM | POA: Diagnosis present

## 2012-02-17 DIAGNOSIS — B952 Enterococcus as the cause of diseases classified elsewhere: Secondary | ICD-10-CM | POA: Diagnosis present

## 2012-02-17 DIAGNOSIS — G309 Alzheimer's disease, unspecified: Secondary | ICD-10-CM | POA: Diagnosis present

## 2012-02-17 DIAGNOSIS — G20A1 Parkinson's disease without dyskinesia, without mention of fluctuations: Secondary | ICD-10-CM | POA: Diagnosis present

## 2012-02-17 DIAGNOSIS — K589 Irritable bowel syndrome without diarrhea: Secondary | ICD-10-CM | POA: Diagnosis present

## 2012-02-17 DIAGNOSIS — E039 Hypothyroidism, unspecified: Secondary | ICD-10-CM | POA: Diagnosis present

## 2012-02-17 DIAGNOSIS — I498 Other specified cardiac arrhythmias: Secondary | ICD-10-CM | POA: Diagnosis present

## 2012-02-17 LAB — DIFFERENTIAL
Basophils Absolute: 0 10*3/uL (ref 0.0–0.1)
Eosinophils Absolute: 0 10*3/uL (ref 0.0–0.7)
Eosinophils Relative: 0 % (ref 0–5)
Lymphocytes Relative: 7 % — ABNORMAL LOW (ref 12–46)
Monocytes Absolute: 0.7 10*3/uL (ref 0.1–1.0)

## 2012-02-17 LAB — CREATININE, SERUM: Creatinine, Ser: 0.96 mg/dL (ref 0.50–1.35)

## 2012-02-17 LAB — CBC
HCT: 39.1 % (ref 39.0–52.0)
MCH: 28.3 pg (ref 26.0–34.0)
MCH: 28.3 pg (ref 26.0–34.0)
MCV: 90.1 fL (ref 78.0–100.0)
MCV: 87.7 fL (ref 78.0–100.0)
Platelets: 137 10*3/uL — ABNORMAL LOW (ref 150–400)
RBC: 4.24 MIL/uL (ref 4.22–5.81)
RDW: 13.3 % (ref 11.5–15.5)
RDW: 13.3 % (ref 11.5–15.5)
WBC: 10.8 10*3/uL — ABNORMAL HIGH (ref 4.0–10.5)
WBC: 13.5 10*3/uL — ABNORMAL HIGH (ref 4.0–10.5)

## 2012-02-17 LAB — CARDIAC PANEL(CRET KIN+CKTOT+MB+TROPI)
CK, MB: 2.2 ng/mL (ref 0.3–4.0)
Relative Index: 2.1 (ref 0.0–2.5)
Total CK: 105 U/L (ref 7–232)
Total CK: 118 U/L (ref 7–232)
Troponin I: <0.3 ng/mL (ref <0.30)

## 2012-02-17 LAB — MRSA PCR SCREENING: MRSA by PCR: NEGATIVE

## 2012-02-17 LAB — URINALYSIS, ROUTINE W REFLEX MICROSCOPIC
Glucose, UA: NEGATIVE mg/dL
Hgb urine dipstick: NEGATIVE
Specific Gravity, Urine: 1.012 (ref 1.005–1.030)
pH: 8 (ref 5.0–8.0)

## 2012-02-17 LAB — COMPREHENSIVE METABOLIC PANEL
AST: 19 U/L (ref 0–37)
CO2: 27 mEq/L (ref 19–32)
Calcium: 9.9 mg/dL (ref 8.4–10.5)
Creatinine, Ser: 0.96 mg/dL (ref 0.50–1.35)
GFR calc Af Amer: 81 mL/min — ABNORMAL LOW (ref >90)
GFR calc non Af Amer: 70 mL/min — ABNORMAL LOW (ref >90)
Glucose, Bld: 99 mg/dL (ref 70–99)
Total Protein: 7.7 g/dL (ref 6.0–8.3)

## 2012-02-17 LAB — POCT I-STAT TROPONIN I: Troponin i, poc: 0.01 ng/mL (ref 0.00–0.08)

## 2012-02-17 LAB — TSH: TSH: 0.344 u[IU]/mL — ABNORMAL LOW (ref 0.350–4.500)

## 2012-02-17 MED ORDER — ACETAMINOPHEN 325 MG PO TABS
650.0000 mg | ORAL_TABLET | Freq: Four times a day (QID) | ORAL | Status: DC | PRN
  Administered 2012-02-18: 650 mg via ORAL
  Filled 2012-02-17: qty 2

## 2012-02-17 MED ORDER — DEXTROSE 5 % IV SOLN
1.0000 g | INTRAVENOUS | Status: AC
  Administered 2012-02-17 – 2012-02-19 (×3): 1 g via INTRAVENOUS
  Filled 2012-02-17 (×5): qty 10

## 2012-02-17 MED ORDER — LEVOTHYROXINE SODIUM 25 MCG PO TABS
25.0000 ug | ORAL_TABLET | Freq: Every day | ORAL | Status: DC
  Administered 2012-02-17 – 2012-02-18 (×2): 25 ug via ORAL
  Filled 2012-02-17 (×3): qty 1

## 2012-02-17 MED ORDER — POLYETHYLENE GLYCOL 3350 17 G PO PACK
17.0000 g | PACK | Freq: Two times a day (BID) | ORAL | Status: DC
  Administered 2012-02-17 – 2012-02-22 (×12): 17 g via ORAL
  Filled 2012-02-17 (×16): qty 1

## 2012-02-17 MED ORDER — BISACODYL 10 MG RE SUPP: 10.0000 mg | Freq: Every day | RECTAL | Status: DC | PRN

## 2012-02-17 MED ORDER — CALCIUM CARBONATE-VITAMIN D 500-200 MG-UNIT PO TABS
1.0000 | ORAL_TABLET | Freq: Two times a day (BID) | ORAL | Status: DC
  Administered 2012-02-17 – 2012-02-23 (×13): 1 via ORAL
  Filled 2012-02-17 (×15): qty 1

## 2012-02-17 MED ORDER — ACETAMINOPHEN 325 MG PO TABS
ORAL_TABLET | ORAL | Status: AC
  Administered 2012-02-17: 650 mg
  Filled 2012-02-17: qty 2

## 2012-02-17 MED ORDER — ALENDRONATE SODIUM 70 MG PO TABS: 70.0000 mg | ORAL_TABLET | ORAL | Status: DC

## 2012-02-17 MED ORDER — ACETAMINOPHEN 650 MG RE SUPP: 650.0000 mg | Freq: Four times a day (QID) | RECTAL | Status: DC | PRN

## 2012-02-17 MED ORDER — ACETAMINOPHEN 325 MG PO TABS: 650.0000 mg | ORAL_TABLET | Freq: Once | ORAL | Status: DC

## 2012-02-17 MED ORDER — LORATADINE 10 MG PO TABS
10.0000 mg | ORAL_TABLET | Freq: Every day | ORAL | Status: DC
  Administered 2012-02-17 – 2012-02-23 (×7): 10 mg via ORAL
  Filled 2012-02-17 (×7): qty 1

## 2012-02-17 MED ORDER — GUAIFENESIN 100 MG/5ML PO SYRP
200.0000 mg | ORAL_SOLUTION | Freq: Three times a day (TID) | ORAL | Status: DC | PRN
  Filled 2012-02-17: qty 118

## 2012-02-17 MED ORDER — SODIUM CHLORIDE 0.9 % IV SOLN
Freq: Once | INTRAVENOUS | Status: AC
  Administered 2012-02-17: 12:00:00 via INTRAVENOUS

## 2012-02-17 MED ORDER — ONDANSETRON HCL 4 MG/2ML IJ SOLN: 4.0000 mg | Freq: Four times a day (QID) | INTRAMUSCULAR | Status: DC | PRN

## 2012-02-17 MED ORDER — GUAIFENESIN 100 MG/5ML PO SOLN
10.0000 mL | Freq: Three times a day (TID) | ORAL | Status: DC | PRN
  Filled 2012-02-17: qty 10

## 2012-02-17 MED ORDER — HYDRALAZINE HCL 20 MG/ML IJ SOLN
10.0000 mg | Freq: Three times a day (TID) | INTRAMUSCULAR | Status: DC | PRN
  Filled 2012-02-17: qty 0.5

## 2012-02-17 MED ORDER — ENOXAPARIN SODIUM 40 MG/0.4ML ~~LOC~~ SOLN
40.0000 mg | SUBCUTANEOUS | Status: DC
  Administered 2012-02-17 – 2012-02-22 (×6): 40 mg via SUBCUTANEOUS
  Filled 2012-02-17 (×7): qty 0.4

## 2012-02-17 MED ORDER — ASPIRIN EC 81 MG PO TBEC
81.0000 mg | DELAYED_RELEASE_TABLET | Freq: Every day | ORAL | Status: DC
  Administered 2012-02-18 – 2012-02-23 (×6): 81 mg via ORAL
  Filled 2012-02-17 (×6): qty 1

## 2012-02-17 MED ORDER — DOCUSATE SODIUM 100 MG PO CAPS
100.0000 mg | ORAL_CAPSULE | Freq: Two times a day (BID) | ORAL | Status: DC
  Administered 2012-02-17 – 2012-02-23 (×12): 100 mg via ORAL
  Filled 2012-02-17 (×13): qty 1

## 2012-02-17 MED ORDER — ONDANSETRON HCL 4 MG PO TABS: 4.0000 mg | ORAL_TABLET | Freq: Four times a day (QID) | ORAL | Status: DC | PRN

## 2012-02-17 MED ORDER — SENNA 8.6 MG PO TABS
1.0000 | ORAL_TABLET | Freq: Two times a day (BID) | ORAL | Status: DC
  Administered 2012-02-17 – 2012-02-23 (×12): 8.6 mg via ORAL
  Filled 2012-02-17 (×13): qty 1

## 2012-02-17 MED ORDER — SODIUM CHLORIDE 0.9 % IV SOLN: INTRAVENOUS | Status: DC

## 2012-02-17 MED ORDER — SODIUM CHLORIDE 0.9 % IV SOLN
INTRAVENOUS | Status: DC
  Administered 2012-02-17 – 2012-02-18 (×3): via INTRAVENOUS

## 2012-02-17 MED ORDER — MEMANTINE HCL 10 MG PO TABS
10.0000 mg | ORAL_TABLET | Freq: Every evening | ORAL | Status: DC
  Administered 2012-02-17 – 2012-02-18 (×2): 10 mg via ORAL
  Filled 2012-02-17 (×3): qty 1

## 2012-02-17 NOTE — ED Notes (Signed)
Pt from home with syncopal episiode this AM. Unwitnessed. Pt reportedly has been have frequent syncopal events over the last several weeks. EMS back boarded pt PTA. Seen yesterday by urology and dx with UTI. Currently treated with nitrofurantin. Has not had todays dose. Denies pain, sensation intact. A&O x2

## 2012-02-17 NOTE — Progress Notes (Signed)
   CARE MANAGEMENT NOTE 02/17/2012  Patient:  Ricky [name], Ricky [name]   Account Number:  [id number]  Date Initiated:  02/17/2012  Documentation initiated by:  Onnie Boer  Subjective/Objective Assessment:   PT WAS ADMITTED WITH SYNCOPE, FALL AND WAS DX WITH UTI     Action/Plan:   PROGRESSION OF CARE AND DISCHARGE PLANNING   Anticipated DC Date:     Anticipated DC Plan:           Choice offered to / List presented to:             Status of service:  In process, will continue to follow Medicare Important Message given?   (If response is "NO", the following Medicare IM given date fields will be blank) Date Medicare IM given:   Date Additional Medicare IM given:    Discharge Disposition:    Per UR Regulation:  Reviewed for med. necessity/level of care/duration of stay  If discussed at Long Length of Stay Meetings, dates discussed:    Comments:  02/17/12 Onnie Boer, RN, BSN 1505 UR COMPLETED

## 2012-02-17 NOTE — Telephone Encounter (Signed)
lisa called stating that Ricky [name] was like a zombie this morning and it took him an hour to get out of bed and had messed up all in the bed and when Ricky [name] stood him up that Ricky [name] collapsed on the floor and was unresponsive but breathing and has been taken to the hospital. However Dr.Mark Vidal Schwalbe his urologist thinks he has a bladder infection which he called in an antibiotic for that yesterday but has not picked it up yet

## 2012-02-17 NOTE — ED Notes (Signed)
Daughter Hue Frick Legal Guardian [phone]

## 2012-02-17 NOTE — ED Notes (Signed)
Blood cultures completed per lab

## 2012-02-17 NOTE — ED Provider Notes (Signed)
History     CSN: [id number]  Arrival date & time 02/17/12  0909   First MD Initiated Contact with Patient 02/17/12 [phone]      Chief Complaint  Patient presents with  . Loss of Consciousness    (Consider location/radiation/quality/duration/timing/severity/associated sxs/prior treatment) Patient is a 78 y.o. male presenting with syncope. The history is provided by the patient. The history is limited by the condition of the patient.  Loss of Consciousness   patient here with fall this morning. Possible syncopal episode although it was unwitnessed. Patient is a poor historian and is unsure what happened. According to EMS, patient was found on the ground there was a broken piece of furniture. He has had a history of syncopal events in the past. Patient was seen by his PCP yesterday diagnosed with UTI and constipation. Patient at this time denies any complaints of headache, neck pain, chest pain, abdominal pain. Patient placed on a backboard with out C-spine precautions and transported  Past Medical History  Diagnosis Date  . IBS (irritable bowel syndrome)   . Diverticulosis   . Cataract   . OAB (overactive bladder)   . Macular degeneration   . Alzheimer disease   . Hypertension   . Arthritis   . Osteoporosis   . Parkinsonian syndrome   . Syncope, vasovagal     Past Surgical History  Procedure Date  . Esophagogastroduodenoscopy   . Eye surgery     LEFT CATARACT    Family History  Problem Relation Age of Onset  . Cancer Mother   . Stroke Father   . Liver disease Father   . Kidney disease Brother     History  Substance Use Topics  . Smoking status: Never Smoker   . Smokeless tobacco: Never Used  . Alcohol Use: No      Review of Systems  Unable to perform ROS Cardiovascular: Positive for syncope.    Allergies  Review of patient's allergies indicates no known allergies.  Home Medications   Current Outpatient Rx  Name Route Sig Dispense Refill  .  ACETAMINOPHEN 500 MG PO TABS Oral Take 500 mg by mouth 2 (two) times daily.    . ALENDRONATE SODIUM 70 MG PO TABS Oral Take 70 mg by mouth every 7 (seven) days. Takes on Thursdays. Take with a full glass of water on an empty stomach. Hold while in hospital    . ASPIRIN EC 81 MG PO TBEC Oral Take 1 tablet (81 mg total) by mouth daily. 30 tablet 11  . CALCIUM CARBONATE-VITAMIN D 500-200 MG-UNIT PO TABS Oral Take 1 tablet by mouth 2 (two) times daily.    . GUAIFENESIN 100 MG/5ML PO SYRP Oral Take 200 mg by mouth 3 (three) times daily as needed.    Marland Kitchen LEVOTHYROXINE SODIUM 25 MCG PO TABS Oral Take 25 mcg by mouth daily.    Marland Kitchen LISINOPRIL 20 MG PO TABS Oral Take 20 mg by mouth daily.    Marland Kitchen LORATADINE 10 MG PO TABS Oral Take 10 mg by mouth daily.    Marland Kitchen MEMANTINE HCL 10 MG PO TABS Oral Take 10 mg by mouth every evening.     . ICAPS AREDS FORMULA PO Oral Take 2 capsules by mouth 2 (two) times daily.     . MULTI-VITAMIN/MINERALS PO TABS Oral Take 1 tablet by mouth daily.        BP 140/70  Pulse 64  Temp(Src) 98.4 F (36.9 C) (Oral)  Resp 24  SpO2 98%  Physical Exam  Nursing note and vitals reviewed. Constitutional: He appears well-developed and well-nourished.  Non-toxic appearance. No distress.  HENT:  Head: Normocephalic and atraumatic.  Eyes: Conjunctivae, EOM and lids are normal. Pupils are equal, round, and reactive to light.  Neck: Normal range of motion. Neck supple. No tracheal deviation present. No mass present.  Cardiovascular: Normal rate, regular rhythm and normal heart sounds.  Exam reveals no gallop.   No murmur heard. Pulmonary/Chest: Effort normal and breath sounds normal. No stridor. No respiratory distress. He has no decreased breath sounds. He has no wheezes. He has no rhonchi. He has no rales.  Abdominal: Soft. Normal appearance and bowel sounds are normal. He exhibits no distension. There is no tenderness. There is no rebound and no CVA tenderness.  Musculoskeletal: Normal range  of motion. He exhibits no edema and no tenderness.  Neurological: He is alert. He displays tremor. No cranial nerve deficit or sensory deficit. GCS eye subscore is 4. GCS verbal subscore is 5. GCS motor subscore is 6.  Skin: Skin is warm and dry. No abrasion and no rash noted.  Psychiatric: His speech is normal. His affect is blunt. He is slowed.    ED Course  Procedures (including critical care time)  Labs Reviewed - No data to display No results found.   No diagnosis found.    MDM   Date: 02/17/2012  Rate: 66  Rhythm: indeterminate  QRS Axis: normal  Intervals: normal  ST/T Wave abnormalities: nonspecific ST changes  Conduction Disutrbances:right bundle branch block  Narrative Interpretation:   Old EKG Reviewed: unchanged  11:45 AM Patient started on Rocephin for his urinary tract infection. Given Tylenol for his fever. Spoke with tract hospitalist he will be admitted        Toy Baker, MD 02/17/12 1146

## 2012-02-17 NOTE — Telephone Encounter (Signed)
[name] called again, Mr Ricky [name] out this morning.  He has UTI and was prescribed   Nitro Furantoin.  They had to do 2 urinalysis to get a result. He is at [facility] ER now.

## 2012-02-17 NOTE — ED Notes (Signed)
Old and new ekg given to edp 

## 2012-02-17 NOTE — H&P (Signed)
Hospital Admission Note Date: 02/17/2012  PCP: Carollee Herter, MD, MD  Chief Complaint:Weakness, fall.   History of Present Illness: 78 year old with past medical history significant for Vasovagal  Syncope, Alzheimer's dementia, who was brought by family members to the hospital due to weakness. Per Daughter Marsh Heckler : Her brother was trying to wake up Mr schnabel and it  took him an hour to get him out of bed, Patient was very rigid , stiff, slow in respond. Urinated in bed. He help him stand up with walker, her brother went to other room, and next thing happens he found his father on the floor unresponsive for 1 minute.  Patient was seen by urologist, and was told he had urine infection, antibiotics was prescribe yesterday. They didn't have time to gets the antibiotics.  Daughter relates that Mr Muldrew has been getting more  weak, confused, over last month.  Patient has been constipated also. He was prescribed miralx by his PCP.   Patient doesn't remember what happen. He said I fell. He denies abdominal pain, chest pain.   Allergies: Review of patient's allergies indicates no known allergies. Past Medical History  Diagnosis Date  . IBS (irritable bowel syndrome)   . Diverticulosis   . Cataract   . OAB (overactive bladder)   . Macular degeneration   . Alzheimer disease   . Hypertension   . Arthritis   . Osteoporosis   . Parkinsonian syndrome   . Syncope, vasovagal    Prior to Admission medications   Medication Sig Start Date End Date Taking? Authorizing Provider  acetaminophen (TYLENOL) 500 MG tablet Take 500 mg by mouth 2 (two) times daily.   Yes Historical Provider, MD  alendronate (FOSAMAX) 70 MG tablet Take 70 mg by mouth every 7 (seven) days. Takes on Thursdays. Take with a full glass of water on an empty stomach. Hold while in hospital   Yes Historical Provider, MD  aspirin EC 81 MG tablet Take 1 tablet (81 mg total) by mouth daily. 11/15/11 11/14/12 Yes Rhetta Mura, MD  calcium-vitamin D (OSCAL WITH D) 500-200 MG-UNIT per tablet Take 1 tablet by mouth 2 (two) times daily.   Yes Historical Provider, MD  guaifenesin (ROBITUSSIN) 100 MG/5ML syrup Take 200 mg by mouth 3 (three) times daily as needed.   Yes Historical Provider, MD  levothyroxine (SYNTHROID, LEVOTHROID) 25 MCG tablet Take 25 mcg by mouth daily.   Yes Historical Provider, MD  lisinopril (PRINIVIL,ZESTRIL) 20 MG tablet Take 20 mg by mouth daily.   Yes Historical Provider, MD  loratadine (CLARITIN) 10 MG tablet Take 10 mg by mouth daily.   Yes Historical Provider, MD  memantine (NAMENDA) 10 MG tablet Take 10 mg by mouth every evening.    Yes Historical Provider, MD  Multiple Vitamins-Minerals (ICAPS AREDS FORMULA PO) Take 2 capsules by mouth 2 (two) times daily.    Yes Historical Provider, MD  Multiple Vitamins-Minerals (MULTIVITAMIN WITH MINERALS) tablet Take 1 tablet by mouth daily.     Yes Historical Provider, MD   Past Surgical History  Procedure Date  . Esophagogastroduodenoscopy   . Eye surgery     LEFT CATARACT   Family History  Problem Relation Age of Onset  . Cancer Mother   . Stroke Father   . Liver disease Father   . Kidney disease Brother    History   Social History  . Marital Status: Single  Occupational History  . Not on file.   Social History Main Topics  . Smoking status: Never Smoker   . Smokeless tobacco: Never Used  . Alcohol Use: No  . Drug Use: No  . Sexually Active: Not on file      REVIEW OF SYSTEMS:   HEENT:  No headaches, Difficulty swallowing,Tooth/dental problems,Sore throat,  No sneezing, itching, ear ache, nasal congestion, post nasal drip,  Cardio-vascular:  No chest pain, Orthopnea, PND, swelling in lower extremities, anasarca, dizziness, palpitations  GI:  No heartburn, indigestion, abdominal pain, nausea, vomiting, diarrhea, change in bowel habits, loss of appetite  Resp:  No shortness of breath with exertion  or at rest. No excess mucus, no productive cough, No non-productive cough, No coughing up of blood.No change in color of mucus.No wheezing.No chest wall deformity  Skin:  no rash or lesions.  Musculoskeletal:  No joint pain or swelling. No decreased range of motion. No back pain.    Physical Exam: Filed Vitals:   02/17/12 0925 02/17/12 1110 02/17/12 1130 02/17/12 1215  BP: 140/70  153/83 141/74  Pulse:   68 64  Temp:  101.8 F (38.8 C)    TempSrc:  Rectal    Resp: 24  22 21   SpO2: 98%  98% 99%   No intake or output data in the 24 hours ending 02/17/12 1247 BP 141/74  Pulse 64  Temp(Src) 101.8 F (38.8 C) (Rectal)  Resp 21  SpO2 99%  General Appearance:    Alert, cooperative, no distress, appears stated age  Head:    Normocephalic, without obvious abnormality, atraumatic  Eyes:    PERRL, conjunctiva/corneas clear, EOM's intact,          Ears:    Normal TM's and external ear canals, both ears  Nose:   Nares normal, septum midline, mucosa normal, no drainage    or sinus tenderness  Throat:   Lips, mucosa, and tongue normal; teeth and gums normal  Neck:   Supple, symmetrical, trachea midline, no adenopathy;       thyroid:  No enlargement/tenderness/nodules; no carotid   bruit or JVD     Lungs:     Clear to auscultation bilaterally, respirations unlabored     Heart:    Regular rate and rhythm, S1 and S2 normal, no murmur, rub   or gallop  Abdomen:     Soft, non-tender, bowel sounds active all four quadrants,    no masses, no organomegaly        Extremities:   Extremities normal, atraumatic, no cyanosis or edema  Pulses:   2+ and symmetric all extremities  Skin:   Skin color, texture, turgor normal, no rashes or lesions  Lymph nodes:   Cervical, supraclavicular, and axillary nodes normal  Neurologic:   CNII-XII intact. Normal strength, sensation and reflexes      Throughout, generalized weak.    Lab results:  [facility] 02/17/12 0941  NA 142  K 4.1  CL 104  CO2 27    GLUCOSE 99  BUN 15  CREATININE 0.96  CALCIUM 9.9  MG --  PHOS --    Basename 02/17/12 0941  AST 19  ALT 14  ALKPHOS 74  BILITOT 0.7  PROT 7.7  ALBUMIN 3.9    Basename 02/17/12 0941  WBC 10.8*  NEUTROABS 9.3*  HGB 12.3*  HCT 39.1  MCV 90.1  PLT 127*   Imaging results:  Dg Chest 2 View  02/17/2012  *RADIOLOGY REPORT*  Clinical Data: Pain.  Cough.  CHEST - 2 VIEW  Comparison: Portable chest x-ray 11/16/2011.  CTA chest 10/22/2010.  Findings: Cardiac enlargement is stable.  Emphysematous changes are again noted.  Mild bibasilar atelectasis is present without significant airspace disease otherwise.  Exaggerated thoracic kyphosis is similar to the prior exam.  IMPRESSION:  1.  Emphysema. 2.  No acute cardiopulmonary disease or significant interval change. 3.  Stable cardiomegaly without failure.  Original Report Authenticated By: Jamesetta Orleans. MATTERN, M.D.   Ct Head Wo Contrast  02/17/2012  *RADIOLOGY REPORT*  Clinical Data: Fall.  Possible syncopal episode.  CT HEAD WITHOUT CONTRAST  Technique:  Contiguous axial images were obtained from the base of the skull through the vertex without contrast.  Comparison: 11/16/2011.  Findings: Motion degraded exam.  No skull fracture or intracranial hemorrhage.  Global atrophy. Ventricular prominence may be related to atrophy without change.  Difficult to exclude a very mild component of hydrocephalus.  Moderate small vessel disease type changes. No CT evidence of large acute infarct. No intracranial mass lesion detected on this unenhanced exam.  Polypoid opacification sphenoid sinus air cells.  IMPRESSION: No skull fracture or intracranial hemorrhage.  Atrophy with ventricular prominence similar to the prior exam.  Prominent small vessel disease type changes.  Polypoid opacification sphenoid sinus air cells.  Original Report Authenticated By: Fuller Canada, M.D.   Other results: EKG: old RBBB.  Patient Active Hospital Problem List:  Syncope:   I will admit patient to telemetry. Cycle cardiac enzymes. CT head was negative. I will check EEG due to  history of stiffness and urinary incontinence. I will order orthostatic vital. Will continue with IV fluids. Patient had prior evaluation  by cardiology and  they thought that his prior episodes of  syncope was vaso vagal. Episode of stiffness, weakness probably related to fever, infection.    UTI (lower urinary tract infection) (02/17/2012) Patient with UA 11 to 20 WBC. He has mild leukocytosis. Urine culture, blood culture ordered. Monitor for sepsis. I will continue with ceftriaxone.   Weakness (02/17/2012): deconditioning. In setting of infection. Treat infection. He will need PT, OT consult. I will ordered TSH.   Constipation (02/17/2012): KUB negative for obstruction. I will start docusate, miralax.   HYPOTHYROIDISM (09/24/2009) I will continue with synthroid. Will check TSH.   ALZHEIMERS DISEASE (09/24/2009) Continue with Namenda.   HYPERTENSION, UNSPECIFIED (09/24/2009) Hold BP medication setting infection. If elevated tomorrow will restart medications.       Code Status: Full Code.  Family Communication: I spoke with daughter legal guardian. I gave her update. Explain that MR Bacigalupi was admitted for IV antibiotics and work up for syncope. She relates that Mr musa is full Code.   Syanne Looney M.D. [facility] [phone] 02/17/2012, 12:47 PM

## 2012-02-18 ENCOUNTER — Inpatient Hospital Stay (HOSPITAL_COMMUNITY): Payer: Medicare Other

## 2012-02-18 DIAGNOSIS — R569 Unspecified convulsions: Secondary | ICD-10-CM

## 2012-02-18 LAB — COMPREHENSIVE METABOLIC PANEL
ALT: 11 U/L (ref 0–53)
AST: 17 U/L (ref 0–37)
Albumin: 3.4 g/dL — ABNORMAL LOW (ref 3.5–5.2)
Alkaline Phosphatase: 66 U/L (ref 39–117)
CO2: 26 mEq/L (ref 19–32)
Chloride: 106 mEq/L (ref 96–112)
GFR calc non Af Amer: 69 mL/min — ABNORMAL LOW (ref >90)
Potassium: 3.6 mEq/L (ref 3.5–5.1)
Sodium: 141 mEq/L (ref 135–145)
Total Bilirubin: 0.6 mg/dL (ref 0.3–1.2)

## 2012-02-18 LAB — CBC
MCH: 28.5 pg (ref 26.0–34.0)
MCHC: 31.8 g/dL (ref 30.0–36.0)
MCV: 89.5 fL (ref 78.0–100.0)
Platelets: 124 10*3/uL — ABNORMAL LOW (ref 150–400)
RBC: 4.11 MIL/uL — ABNORMAL LOW (ref 4.22–5.81)
RDW: 13.4 % (ref 11.5–15.5)

## 2012-02-18 LAB — CARDIAC PANEL(CRET KIN+CKTOT+MB+TROPI)
Relative Index: 1.5 (ref 0.0–2.5)
Troponin I: <0.3 ng/mL (ref <0.30)

## 2012-02-18 MED ORDER — HYDRALAZINE HCL 10 MG PO TABS
10.0000 mg | ORAL_TABLET | Freq: Three times a day (TID) | ORAL | Status: DC
  Administered 2012-02-18 – 2012-02-19 (×2): 10 mg via ORAL
  Filled 2012-02-18 (×5): qty 1

## 2012-02-18 MED ORDER — LEVOTHYROXINE SODIUM 25 MCG PO TABS
12.5000 ug | ORAL_TABLET | Freq: Every day | ORAL | Status: DC
  Administered 2012-02-19 – 2012-02-23 (×5): 12.5 ug via ORAL
  Filled 2012-02-18 (×6): qty 0.5

## 2012-02-18 NOTE — Progress Notes (Signed)
PROGRESS NOTE  Ricky [name] ZOX:[id number] DOB: 06-Sep-1917 DOA: 02/17/2012 PCP: Carollee Herter, MD, MD  Brief narrative: 78 y/o AAM admit with syncope and possible UTI  Past medical history: Vasovagal syncope-multiple x's, Htn, Osteoporosis, ? Gout, freq falls, h/o Pulm embolism 03/2009, bradycardia-now resolved off Cholinesterase inhibitors, Cavitary pneumonia 02/2009, ?Parkinsonism  Consultants:  none  Procedures:  CT head 02/17/12 = no skull fracture or intracranial hemorrhage  2 view chest x-ray 4/2 = MP symmetric, no acute cardiopulmonary disease, stable cardiomegaly without failure  Abdominal x-ray 1 view = moderate stool throughout colon without evidence of obstruction and degenerative changes in lumbar spine and right greater than left  Antibiotics:  None   Subjective  Doing well.  No specific c/o.  Eats his Cake throughout our discussion and refuses to stop Eating for me to examine him.  No cp/sob/n/v   Objective   Interim History:   Objective: Filed Vitals:   02/18/12 0604 02/18/12 1058 02/18/12 1156 02/18/12 1342  BP: 175/97  147/82 163/90  Pulse: 68  53 70  Temp: 98.5 F (36.9 C)  97.9 F (36.6 C) 98.1 F (36.7 C)  TempSrc: Oral  Oral Oral  Resp: 20 12 24 18   Height:      Weight: 77.5 kg (170 lb 13.7 oz)     SpO2: 98%  100% 99%    Intake/Output Summary (Last 24 hours) at 02/18/12 1742 Last data filed at 02/18/12 1300  Gross per 24 hour  Intake   1780 ml  Output    900 ml  Net    880 ml    Exam:  General: alert, oreinted x 2.  +arcus Cardiovascular: s1 s2 no m/r/g Respiratory: clinically clear.  No added sound  Abdomen: soft, nt/nd Skin no lower extr edema Neuro cn 2-12 grossly intact.  Data Reviewed: Basic Metabolic Panel:  Lab 02/18/12 8119 02/17/12 1519 02/17/12 0941  NA 141 -- 142  K 3.6 -- 4.1  CL 106 -- 104  CO2 26 -- 27  GLUCOSE 98 -- 99  BUN 16 -- 15  CREATININE 0.97 0.96 0.96  CALCIUM 9.1 -- 9.9  MG -- -- --    PHOS -- -- --   Liver Function Tests:  Lab 02/18/12 0508 02/17/12 0941  AST 17 19  ALT 11 14  ALKPHOS 66 74  BILITOT 0.6 0.7  PROT 7.0 7.7  ALBUMIN 3.4* 3.9   No results found for this basename: LIPASE:5,AMYLASE:5 in the last 168 hours No results found for this basename: AMMONIA:5 in the last 168 hours CBC:  Lab 02/18/12 0508 02/17/12 1519 02/17/12 0941  WBC 7.4 13.5* 10.8*  NEUTROABS -- -- 9.3*  HGB 11.7* 12.0* 12.3*  HCT 36.8* 37.2* 39.1  MCV 89.5 87.7 90.1  PLT 124* 137* 127*   Cardiac Enzymes:  Lab 02/18/12 0508 02/17/12 2046 02/17/12 1345  CKTOTAL 118 118 105  CKMB 1.8 2.1 2.2  CKMBINDEX -- -- --  TROPONINI <0.30 <0.30 <0.30   BNP: No components found with this basename: POCBNP:5 CBG:  Lab 02/17/12 0936  GLUCAP 100*    Recent Results (from the past 240 hour(s))  URINE CULTURE     Status: Normal (Preliminary result)   Collection Time   02/17/12 11:05 AM      Component Value Range Status Comment   Specimen Description URINE, CATHETERIZED   Final    Special Requests NONE   Final    Culture  Setup Time [id number]   Final    Colony  Count PENDING   Incomplete    Culture Culture reincubated for better growth   Final    Report Status PENDING   Incomplete   CULTURE, BLOOD (ROUTINE X 2)     Status: Normal (Preliminary result)   Collection Time   02/17/12 11:48 AM      Component Value Range Status Comment   Specimen Description BLOOD RIGHT HAND   Final    Special Requests BOTTLES DRAWN AEROBIC ONLY 1CC   Final    Culture  Setup Time [id number]   Final    Culture     Final    Value:        BLOOD CULTURE RECEIVED NO GROWTH TO DATE CULTURE WILL BE HELD FOR 5 DAYS BEFORE ISSUING A FINAL NEGATIVE REPORT   Report Status PENDING   Incomplete   CULTURE, BLOOD (ROUTINE X 2)     Status: Normal (Preliminary result)   Collection Time   02/17/12 12:00 PM      Component Value Range Status Comment   Specimen Description BLOOD LEFT WRIST   Final    Special Requests      Final    Value: BOTTLES DRAWN AEROBIC AND ANAEROBIC BLUE 10CC, RED 5CC   Culture  Setup Time [id number]   Final    Culture     Final    Value:        BLOOD CULTURE RECEIVED NO GROWTH TO DATE CULTURE WILL BE HELD FOR 5 DAYS BEFORE ISSUING A FINAL NEGATIVE REPORT   Report Status PENDING   Incomplete   MRSA PCR SCREENING     Status: Normal   Collection Time   02/17/12  7:31 PM      Component Value Range Status Comment   MRSA by PCR NEGATIVE  NEGATIVE  Final      Studies:              All Imaging reviewed and is as per above notation   Scheduled Meds:   . aspirin EC  81 mg Oral Daily  . calcium-vitamin D  1 tablet Oral BID WC  . cefTRIAXone (ROCEPHIN)  IV  1 g Intravenous Q24H  . docusate sodium  100 mg Oral BID  . enoxaparin  40 mg Subcutaneous Q24H  . levothyroxine  25 mcg Oral Q breakfast  . loratadine  10 mg Oral Daily  . memantine  10 mg Oral QPM  . polyethylene glycol  17 g Oral BID  . senna  1 tablet Oral BID   Continuous Infusions:   . sodium chloride 75 mL/hr at 02/18/12 1051     Assessment/Plan: 1. Syncope-Likely Vasovagal.  Continue IVf low dose for now, would get Orthostatics.  Has had work-up in the past, and recommendation per Cardiologist was to have a 2nd opinion re: cholinesterase inhib/fall risk.  Will reassess 2. Dementia-Alzheimer's vs other-placed back on Namenda by ? PCP-Will continue.  Per neurology. 3. Parkinsonism-Not currently on Levo-dopa.  Needs out-patient re-eval.  Was on this but was allegedly d/c Now 2012 4. ? Gout-no current pain issues.  Monitor 5. R/o UTI-UC pending and re-incubated.   Continue Empiric Rocephin d#2-d/c tomorrow if no evidence UTI.started on this by Dr. Brunilda Payor, Urology 6. Hypothyroidism-TSH was slighlty low.  Would consider scaling back his Thyroxine given his presentation. 7. H/o IBS/Diverticulosis-stable 8. Htn-not controlled.  Would not use nodal agent.  Would use Hydralazine 10 mg q 8 hourly 9. Anemia-Would not work-up at  this age/stage.  Follow trend in 1-2  days with CBC 10. TCP-Has been relatively low since admit.  [place].  If drops, would work-up   Code Status: Full Family Communication: Called daughter Ricky [name] at listed number [phone].  No answer.  Will try again in am. Disposition Plan: SNF likely   Pleas Koch, MD  [facility] Pager [phone] 02/18/2012, 5:42 PM    LOS: 1 day

## 2012-02-18 NOTE — Procedures (Signed)
EEG NUMBER:  [id number].  This routine EEG was requested in this 78 year old man with a history of UTI and weakness.  The patient was slow to respond.  He had bladder incontinence.  He has a history of Alzheimer's dementia and Parkinson's disease.  MEDICATIONS:  Namenda and Synthroid.  The EEG was done with the patient awake and drowsy.  During periods of maximal wakefulness, he had a poorly regulated, poorly sustained, 9-10 cycle per second posterior dominant rhythm.  This was superimposed upon mixed alpha and beta activities that did appear symmetric.  Photic stimulation produced no driving response.  Hyperventilation was not performed.  The patient was drowsy through some of the tracing as characterized by attenuation of the alpha rhythm as well as muscle activity and the appearance of slower polymorphic delta and theta activities that were symmetric.  No stage 2 sleep was seen.  EKG revealed a sinus rhythm with an abnormally wide QRS complex.  CLINICAL INTERPRETATION:  This routine EEG done with the patient awake and drowsy is normal.          ______________________________ Denton Meek, MD    ZO:[id number] D:  02/18/2012 17:24:59  T:  02/18/2012 17:45:14  Job #:  [id number]

## 2012-02-18 NOTE — Progress Notes (Signed)
Portable EEG w/ video completed. 

## 2012-02-18 NOTE — Progress Notes (Signed)
02/18/12 NSg 1215 Spoke with pt. Daughter & POA, Demorris Choyce via phone.  She stated that is her father needs rehab.  Would prefer it be at home and not a SNF.  She can be reached @ [phone].  Will continue to follow and assist with d/c planning.  Forbes Cellar, RN

## 2012-02-19 MED ORDER — HYDRALAZINE HCL 25 MG PO TABS
25.0000 mg | ORAL_TABLET | Freq: Three times a day (TID) | ORAL | Status: DC
  Administered 2012-02-19 – 2012-02-23 (×13): 25 mg via ORAL
  Filled 2012-02-19 (×15): qty 1

## 2012-02-19 MED ORDER — MEMANTINE HCL 5 MG PO TABS
5.0000 mg | ORAL_TABLET | Freq: Every evening | ORAL | Status: DC
  Administered 2012-02-19 – 2012-02-23 (×5): 5 mg via ORAL
  Filled 2012-02-19 (×5): qty 1

## 2012-02-19 NOTE — Evaluation (Signed)
Physical Therapy Evaluation Patient Details Name: Ricky [name] MRN: [id number] DOB: 12/19/1916 Today's Date: 02/19/2012  Problem List:  Patient Active Problem List  Diagnoses  . HYPOTHYROIDISM  . GOUT, UNSPECIFIED  . ALZHEIMERS DISEASE  . HYPERTENSION, UNSPECIFIED  . BRADYCARDIA  . GASTROPARESIS  . DYSPHAGIA UNSPECIFIED  . SYNCOPE, HX OF  . PULMONARY EMBOLISM, HX OF  . Rectal prolapse  . PNA (pneumonia)  . Syncope  . Alzheimer disease  . Hypertension  . Parkinsonian syndrome  . Syncope, vasovagal  . Weakness  . UTI (lower urinary tract infection)  . Constipation    Past Medical History:  Past Medical History  Diagnosis Date  . IBS (irritable bowel syndrome)   . Diverticulosis   . Cataract   . OAB (overactive bladder)   . Macular degeneration   . Alzheimer disease   . Hypertension   . Arthritis   . Osteoporosis   . Parkinsonian syndrome   . Syncope, vasovagal   . Neuromuscular disorder     parkisonian syndrome   Past Surgical History:  Past Surgical History  Procedure Date  . Esophagogastroduodenoscopy   . Eye surgery     LEFT CATARACT    PT Assessment/Plan/Recommendation PT Assessment Clinical Impression Statement: Pt is a 78 y/o male who was living at home with son as primary caregiver.  Pt has comlex medical history including Parkinson's disease and Dementia. Prior to this past Christmas pt was modified Independent with all mobility.  Since then pt has been progressively requiring more assistance from family.  Today pt was total assist for all functional mobilty.  Pt also has history of  syncopal episodes which family attributes to vasovagal response.  Today pt's blood pressure dropped from 160/106 in sitting to 130/87 in standing.  Pt's family plan to take pt home from hospital.  Pt son to be made aware of level of care pt requires.   PT Recommendation/Assessment: Patient will need skilled PT in the acute care venue PT Problem List: Decreased  strength;Decreased activity tolerance;Decreased balance;Decreased mobility;Decreased coordination;Decreased cognition;Decreased knowledge of use of DME;Pain;Decreased range of motion Barriers to Discharge: Inaccessible home environment (pt has 6 stair to enter home. ) Barriers to Discharge Comments: Pt has 6 stairs to enter home.   PT Therapy Diagnosis : Generalized weakness;Altered mental status;Difficulty walking PT Plan PT Frequency: Min 3X/week PT Treatment/Interventions: DME instruction;Gait training;Stair training;Functional mobility training;Therapeutic activities;Therapeutic exercise;Balance training;Neuromuscular re-education;Patient/family education;Wheelchair mobility training PT Recommendation Recommendations for Other Services: OT consult;Rehab consult Follow Up Recommendations: Inpatient Rehab;Supervision/Assistance - 24 hour Equipment Recommended: Wheelchair (measurements);3 in 1 bedside comode PT Goals  Acute Rehab PT Goals PT Goal Formulation: With patient Time For Goal Achievement: 2 weeks Pt will Roll Supine to Right Side: with min assist PT Goal: Rolling Supine to Right Side - Progress: Goal set today Pt will Roll Supine to Left Side: with min assist PT Goal: Rolling Supine to Left Side - Progress: Goal set today Pt will go Supine/Side to Sit: with mod assist PT Goal: Supine/Side to Sit - Progress: Goal set today Pt will Sit at Edge of Bed: with supervision;3-5 min PT Goal: Sit at Edge Of Bed - Progress: Goal set today Pt will go Sit to Supine/Side: with mod assist PT Goal: Sit to Supine/Side - Progress: Goal set today Pt will go Sit to Stand: with mod assist PT Goal: Sit to Stand - Progress: Goal set today Pt will go Stand to Sit: with supervision PT Goal: Stand to Sit - Progress: Goal set  today Pt will Transfer Bed to Chair/Chair to Bed: with mod assist PT Transfer Goal: Bed to Chair/Chair to Bed - Progress: Goal set today Pt will Ambulate: 1 - 15 feet;with min  assist;with least restrictive assistive device PT Goal: Ambulate - Progress: Goal set today Pt will Go Up / Down Stairs: 6-9 stairs;with mod assist;with rail(s) PT Goal: Up/Down Stairs - Progress: Goal set today  PT Evaluation Precautions/Restrictions  Precautions Precautions: Fall Restrictions Weight Bearing Restrictions: No Prior Functioning  Home Living Lives With: Sheran Spine Help From: Family (son 24/7) Type of Home: House Home Layout: One level Home Access: Stairs to enter Entrance Stairs-Rails: Right Entrance Stairs-Number of Steps: 6 Bathroom Shower/Tub: Forensic scientist: Standard Bathroom Accessibility: No Prior Function Level of Independence: Needs assistance with ADLs;Needs assistance with gait;Needs assistance with homemaking;Needs assistance with tranfers Able to Take Stairs?: Yes Driving: No Vocation: Retired Leisure: Hobbies-no Cognition Cognition Arousal/Alertness: Awake/alert Orientation Level: Oriented to person;Oriented to place;Oriented to situation Sensation/Coordination Sensation Light Touch: Appears Intact Stereognosis: Not tested Hot/Cold: Not tested Proprioception: Impaired by gross assessment Coordination Gross Motor Movements are Fluid and Coordinated: No Fine Motor Movements are Fluid and Coordinated: Not tested Coordination and Movement Description: Difficulty moving bilateral UEs and slow to process movement of bilateral LEs (right slower than left) Finger Nose Finger Test: Failed pt unable to locate his chin and overshooting my finger 5/5 trials.  Heel Shin Test: Pt unable to perform with bilateral LE Extremity Assessment RUE Assessment RUE Assessment: Exceptions to [facility] LUE Assessment LUE Assessment: Exceptions to [facility] RLE Assessment RLE Assessment: Within Functional Limits LLE Assessment LLE Assessment: Within Functional Limits Mobility (including Balance) Bed Mobility Bed Mobility: Yes Rolling Right: 1: +1  Total assist;Patient percentage (comment) (Pt < 25%) Rolling Right Details (indicate cue type and reason): Verbal and tactile cues to position contralateral LE into flexion.  Manual facilitation to rotate hips and shoulders.  Manual facilitation to reach contralateral arm across body to grasp bedrail. Verbal cues for pt to pull on rail.   Rolling Left: 1: +1 Total assist;With rail;Patient percentage (comment) (Pt <25%) Rolling Left Details (indicate cue type and reason): See comment for rolling right.  Right Sidelying to Sit: 1: +1 Total assist;With rails;HOB flat Right Sidelying to Sit Details (indicate cue type and reason): Pt require manual facilitation to manage bilateral LEs and to raise shoulders from the bed. Cued pt to push on Right UE via approximation through Right elbow and hand.  Manual facilitation to extend right elbow.   Supine to Sit: 1: +1 Total assist;HOB flat Supine to Sit Details (indicate cue type and reason): Unable to complete this transfer due to increase low back pain.   Sit to Supine: 1: +2 Total assist;HOB flat Sit to Supine - Details (indicate cue type and reason): Assist for management of bilateral LEs and trunk.  Instucted pt to lie down in bed and waited for pt response.  Pt unable to "figure out" how to initiate laying down.  Provided pt with step by step verbal and tactile cueing but pt still unable to complete task without total assist for all aspects of transfer.  Transfers Transfers: Yes Sit to Stand: 1: +2 Total assist;From elevated surface;With upper extremity assist;From bed Sit to Stand Details (indicate cue type and reason): Total assist to block both of pt's feet to prevent LEs from sliding forward.   Stand to Sit: 3: Mod assist;To bed;To elevated surface;Without upper extremity assist Stand to Sit Details: assist for controlled descent  to bed. Gravity perfomed majority of transfer.   Ambulation/Gait Ambulation/Gait: No Stairs: No Wheelchair  Mobility Wheelchair Mobility: No  Posture/Postural Control Posture/Postural Control: Postural limitations Postural Limitations: Pt presents with cervical flexion contracture.  Unable to achieve nuetral head position.  Balance Balance Assessed: Yes Static Sitting Balance Static Sitting - Balance Support: Feet supported;Bilateral upper extremity supported Static Sitting - Level of Assistance: 3: Mod assist;Patient percentage (comment) (pt 60%) Static Sitting - Comment/# of Minutes: Pt sat at EOB for several minutes with Rt lateral lean.  Provided pt with tactile cues and neuromuscular re-education via increased proprioceptive input through left hip for left wt shift.   Exercise    End of Session PT - End of Session Equipment Utilized During Treatment: Gait belt Activity Tolerance: Treatment limited secondary to medical complications (Comment) (Pt's Blood pressure dropped in standing.  See vital signs. ) Patient left: in bed;with call bell in reach;Other (comment) (With MD and nursing. ) Nurse Communication: Mobility status for transfers (+2 total assist ) General Behavior During Session: [facility] for tasks performed Cognition: [facility] for tasks performed  Duaine Radin 02/19/2012, 12:20 PM Izadora Roehr L. Faiza Bansal DPT [phone]

## 2012-02-19 NOTE — Progress Notes (Signed)
PROGRESS NOTE  Ricky [name] AVW:[id number] DOB: [birth year] DOA: 02/17/2012 PCP: Carollee Herter, MD, MD  Brief narrative: 78 y/o AAM admit with syncope and possible UTI  Past medical history: Vasovagal syncope-multiple x's, Htn, Osteoporosis, ? Gout, freq falls, h/o Pulm embolism 03/2009, bradycardia-now resolved off Cholinesterase inhibitors, Cavitary pneumonia 02/2009, ?Parkinsonism  Consultants:  none  Procedures:  CT head 02/17/12 = no skull fracture or intracranial hemorrhage  2 view chest x-ray 4/2 = MP symmetric, no acute cardiopulmonary disease, stable cardiomegaly without failure  Abdominal x-ray 1 view = moderate stool throughout colon without evidence of obstruction and degenerative changes in lumbar spine and right greater than left  EEG done 02/18/12=Normal  Antibiotics:  None   Subjective  Doing well.  Working with PT this am who denotes he is 1+ max assist and not very "steady" on his feet.  Orthostastics  done this am were +.   Objective   Interim History: PT states patient seemed a little "blank-eyed" and was less responsive on standing up.   Objective: Filed Vitals:   02/19/12 0500 02/19/12 0506 02/19/12 0529 02/19/12 0738  BP:  185/93 189/95 180/94  Pulse:   66 70  Temp:   98.4 F (36.9 C) 98 F (36.7 C)  TempSrc:   Oral Oral  Resp:   16 20  Height:      Weight: 80.287 kg (177 lb)  80.276 kg (176 lb 15.6 oz)   SpO2:   98% 97%    Intake/Output Summary (Last 24 hours) at 02/19/12 1052 Last data filed at 02/19/12 0600  Gross per 24 hour  Intake 1776.25 ml  Output   4200 ml  Net -2423.75 ml    Exam:  General: alert, oriented x 2.  +arcus Cardiovascular: s1 s2 no m/r/g Respiratory: clinically clear.  No added sound  Abdomen: soft, nt/nd Skin no lower extr edema Neuro cn 2-12 grossly intact.  Data Reviewed: Basic Metabolic Panel:  Lab 02/18/12 8295 02/17/12 1519 02/17/12 0941  NA 141 -- 142  K 3.6 -- 4.1  CL 106 -- 104  CO2 26  -- 27  GLUCOSE 98 -- 99  BUN 16 -- 15  CREATININE 0.97 0.96 0.96  CALCIUM 9.1 -- 9.9  MG -- -- --  PHOS -- -- --   Liver Function Tests:  Lab 02/18/12 0508 02/17/12 0941  AST 17 19  ALT 11 14  ALKPHOS 66 74  BILITOT 0.6 0.7  PROT 7.0 7.7  ALBUMIN 3.4* 3.9   No results found for this basename: LIPASE:5,AMYLASE:5 in the last 168 hours No results found for this basename: AMMONIA:5 in the last 168 hours CBC:  Lab 02/18/12 0508 02/17/12 1519 02/17/12 0941  WBC 7.4 13.5* 10.8*  NEUTROABS -- -- 9.3*  HGB 11.7* 12.0* 12.3*  HCT 36.8* 37.2* 39.1  MCV 89.5 87.7 90.1  PLT 124* 137* 127*   Cardiac Enzymes:  Lab 02/18/12 0508 02/17/12 2046 02/17/12 1345  CKTOTAL 118 118 105  CKMB 1.8 2.1 2.2  CKMBINDEX -- -- --  TROPONINI <0.30 <0.30 <0.30   BNP: No components found with this basename: POCBNP:5 CBG:  Lab 02/17/12 0936  GLUCAP 100*    Recent Results (from the past 240 hour(s))  URINE CULTURE     Status: Normal (Preliminary result)   Collection Time   02/17/12 11:05 AM      Component Value Range Status Comment   Specimen Description URINE, CATHETERIZED   Final    Special Requests NONE   Final  Culture  Setup Time [id number]   Final    Colony Count PENDING   Incomplete    Culture Culture reincubated for better growth   Final    Report Status PENDING   Incomplete   CULTURE, BLOOD (ROUTINE X 2)     Status: Normal (Preliminary result)   Collection Time   02/17/12 11:48 AM      Component Value Range Status Comment   Specimen Description BLOOD RIGHT HAND   Final    Special Requests BOTTLES DRAWN AEROBIC ONLY 1CC   Final    Culture  Setup Time [id number]   Final    Culture     Final    Value:        BLOOD CULTURE RECEIVED NO GROWTH TO DATE CULTURE WILL BE HELD FOR 5 DAYS BEFORE ISSUING A FINAL NEGATIVE REPORT   Report Status PENDING   Incomplete   CULTURE, BLOOD (ROUTINE X 2)     Status: Normal (Preliminary result)   Collection Time   02/17/12 12:00 PM      Component  Value Range Status Comment   Specimen Description BLOOD LEFT WRIST   Final    Special Requests     Final    Value: BOTTLES DRAWN AEROBIC AND ANAEROBIC BLUE 10CC, RED 5CC   Culture  Setup Time [id number]   Final    Culture     Final    Value:        BLOOD CULTURE RECEIVED NO GROWTH TO DATE CULTURE WILL BE HELD FOR 5 DAYS BEFORE ISSUING A FINAL NEGATIVE REPORT   Report Status PENDING   Incomplete   MRSA PCR SCREENING     Status: Normal   Collection Time   02/17/12  7:31 PM      Component Value Range Status Comment   MRSA by PCR NEGATIVE  NEGATIVE  Final      Studies:              All Imaging reviewed and is as per above notation   Scheduled Meds:    . aspirin EC  81 mg Oral Daily  . calcium-vitamin D  1 tablet Oral BID WC  . cefTRIAXone (ROCEPHIN)  IV  1 g Intravenous Q24H  . docusate sodium  100 mg Oral BID  . enoxaparin  40 mg Subcutaneous Q24H  . hydrALAZINE  10 mg Oral Q8H  . levothyroxine  12.5 mcg Oral Q breakfast  . loratadine  10 mg Oral Daily  . memantine  10 mg Oral QPM  . polyethylene glycol  17 g Oral BID  . senna  1 tablet Oral BID  . DISCONTD: levothyroxine  25 mcg Oral Q breakfast   Continuous Infusions:    . sodium chloride 75 mL/hr at 02/18/12 1051     Assessment/Plan: 1. Syncope-Likely Vasovagal.  Orthostatics are +, Must consider this a class effect of his Namenda.  Would likely need a lower dose of this.  Await objective evidence to r/o UTI.  D/c IVF.   2. Dementia-Alzheimer's vs other-placed back on Namenda by ? PCP-Will need out-patient Neurology review of his case. 3. Parkinsonism-Not currently on Levo-dopa.  Needs out-patient re-eval.  Was on this but was allegedly d/c Now 2012 4. ? Gout-no current pain issues.  Monitor 5. R/o UTI-UC pending and re-incubated.   Continue Empiric Rocephin d#3-White count has trended down.  This would be considered a simple UTI and 3 days IV abx should cover the same  Rpt CBC in  am 6. Hypothyroidism-TSH was slighlty  low.  Would consider scaling back his Thyroxine given his presentation. 7. H/o IBS/Diverticulosis-stable 8. Htn-not controlled.  Would not use nodal agent.  Would use Hydralazine 25 mg q 8 hourly 9. Anemia-Would not work-up at this age/stage.  Follow trend in 1-2 days with CBC 10. TCP-Has been relatively low since admit.  [place].  If drops, would work-up   Code Status: Full Family Communication: Called daughter rollen selders at listed number [phone].  No answer x 2.  Will try to call once again.  LM to call me back Disposition Plan: SNF likely-WIll start the search.  Will need 24 hour care if not at home.   Pleas Koch, MD  [facility] Pager [phone] 02/19/2012, 10:52 AM    LOS: 2 days

## 2012-02-19 NOTE — Evaluation (Signed)
Occupational Therapy Evaluation Patient Details Name: Ricky [name] MRN: [id number] DOB: 1917-11-03 Today's Date: 02/19/2012  Problem List:  Patient Active Problem List  Diagnoses  . HYPOTHYROIDISM  . GOUT, UNSPECIFIED  . ALZHEIMERS DISEASE  . HYPERTENSION, UNSPECIFIED  . BRADYCARDIA  . GASTROPARESIS  . DYSPHAGIA UNSPECIFIED  . SYNCOPE, HX OF  . PULMONARY EMBOLISM, HX OF  . Rectal prolapse  . PNA (pneumonia)  . Syncope  . Alzheimer disease  . Hypertension  . Parkinsonian syndrome  . Syncope, vasovagal  . Weakness  . UTI (lower urinary tract infection)  . Constipation    Past Medical History:  Past Medical History  Diagnosis Date  . IBS (irritable bowel syndrome)   . Diverticulosis   . Cataract   . OAB (overactive bladder)   . Macular degeneration   . Alzheimer disease   . Hypertension   . Arthritis   . Osteoporosis   . Parkinsonian syndrome   . Syncope, vasovagal   . Neuromuscular disorder     parkisonian syndrome   Past Surgical History:  Past Surgical History  Procedure Date  . Esophagogastroduodenoscopy   . Eye surgery     LEFT CATARACT    OT Assessment/Plan/Recommendation OT Assessment Clinical Impression Statement: This 78 y.o. male with history of Alzheimer's disease and Parkinson's disease admitted due to weakness.  Pt. demontrates signifcant rigidity, motor planning deficits, and cogntive deficits.  Currently pt. is requiring Total A with all self care activities including grooming and feeding.   Per PT, who spoke with family, they are planning to take pt. home with 24 hour assistance.  Pt. will need total - Ricky A with all aspects of mobility and ADLs 24/7.   Recommend HHOT if home is indeed the plan OT Recommendation/Assessment: Patient will need skilled OT in the acute care venue OT Problem List: Decreased strength;Decreased range of motion;Decreased activity tolerance;Impaired balance (sitting and/or standing);Decreased coordination;Decreased  cognition;Decreased knowledge of use of DME or AE;Impaired tone;Impaired UE functional use Barriers to Discharge: None OT Therapy Diagnosis : Generalized weakness;Cognitive deficits;Apraxia OT Plan OT Frequency: Min 2X/week OT Treatment/Interventions: Self-care/ADL training;Neuromuscular education;DME and/or AE instruction;Therapeutic activities;Cognitive remediation/compensation;Patient/family education;Balance training OT Recommendation Follow Up Recommendations: Home health OT;Supervision/Assistance - 24 hour Equipment Recommended: Wheelchair (measurements);3 in 1 bedside comode (May benefit from hospital bed) Individuals Consulted Consulted and Agree with Results and Recommendations: Patient unable/family or caregiver not available OT Goals Acute Rehab OT Goals OT Goal Formulation: Patient unable to participate in goal setting Time For Goal Achievement: 2 weeks ADL Goals Pt Will Perform Eating: with mod assist;Supine, head of bed up ADL Goal: Eating - Progress: Goal set today Pt Will Perform Grooming: with mod assist;Supported;Sitting, chair;Supine, head of bed up ADL Goal: Grooming - Progress: Goal set today Pt Will Perform Upper Body Bathing: with mod assist;Supine, head of bed up;Sitting, chair ADL Goal: Upper Body Bathing - Progress: Goal set today Pt Will Transfer to Toilet: with mod assist;Stand pivot transfer;3-in-1 ADL Goal: Toilet Transfer - Progress: Goal set today  OT Evaluation Precautions/Restrictions  Precautions Precautions: Fall Restrictions Weight Bearing Restrictions: No Prior Functioning Home Living Lives With: Sheran Spine Help From: Family;Personal care attendant (has a personal care attendant in the mornings) Type of Home: House Home Layout: One level Home Access: Stairs to enter Entrance Stairs-Rails: Right Entrance Stairs-Number of Steps: 6 Bathroom Shower/Tub: Tub/shower unit;Curtain Bathroom Toilet: Standard Bathroom Accessibility: No Home  Adaptive Equipment: Straight cane;Tub transfer bench Prior Function Level of Independence: Needs assistance with ADLs;Needs assistance with gait;Needs  assistance with homemaking;Needs assistance with tranfers Able to Take Stairs?: Yes Driving: No Vocation: Retired Comments: Family not present to provide info re: level of assistance with ADLs ADL ADL Eating/Feeding: Performed;+1 Total assistance Eating/Feeding Details (indicate cue type and reason): Pt. unable to accurately scoop or stab food with utensil.  Misses mouth, and unable to correct.  Provided hand over hand assist, but pt. unable to take over the task with any success.  Pt. does drink from a cup slowly with supervision to min a Where Assessed - Eating/Feeding: Bed level Grooming: Performed;Teeth care;Maximal assistance Grooming Details (indicate cue type and reason): Pt. unable to correctly orient toothbrush to mouth, and unable to correctly maneuver toothbrush (pt. does report he uses an electric toothbrsuh).  Pt instructed to "rinse and spit", but unable to grasp concept and proceeded to swallow the toothpaste and rinse water Where Assessed - Grooming: Supine, head of bed up Upper Body Bathing: Simulated;+1 Total assistance Where Assessed - Upper Body Bathing: Supine, head of bed up Lower Body Bathing: Simulated;+1 Total assistance Where Assessed - Lower Body Bathing: Supine, head of bed flat;Supine, head of bed up Upper Body Dressing: Simulated;+1 Total assistance Where Assessed - Upper Body Dressing: Supine, head of bed up Lower Body Dressing: Simulated;+1 Total assistance Where Assessed - Lower Body Dressing: Supine, head of bed up;Supine, head of bed flat;Rolling right and/or left ADL Comments: Pt. demonstrates signficant motor planning difficulties.  Very delayed initiating activities.  At times does inititate, but unable to complete task.  Moves very, very slowly.  Pt. unable to discrinimate Lt. and Rt. on command.  When  asked to retrieve item with Lt. UE, he repetively used Rt. UE. Vision/Perception  Vision - Assessment Vision Assessment: Vision not tested Perception Perception: Impaired Praxis Praxis: Impaired Praxis Impairment Details: Motor planning;Ideomotor Cognition Cognition Arousal/Alertness: Awake/alert Overall Cognitive Status: Impaired Attention: Impaired Current Attention Level: Sustained Orientation Level: Oriented to person;Disoriented to place;Disoriented to time;Disoriented to situation Awareness of Errors: Decreased awareness of errors made Decreased Awareness of Errors: Assistance required to correct errors made Awareness of Deficits: Decreased awareness of deficits Problem Solving: Requires assistance for problem solving Cognition - Other Comments: Pt. insists that he is at his house.  When corrected, he became a bit indignant and argumentative.  Unable to provide date, day, month, or year.   Sensation/Coordination Sensation Light Touch: Appears Intact Proprioception: Impaired by gross assessment Coordination Gross Motor Movements are Fluid and Coordinated: No Fine Motor Movements are Fluid and Coordinated: No Coordination and Movement Description: Pt. with rigidity and cogwheeling noted.  Extremity Assessment RUE Assessment RUE Assessment: Exceptions to [facility] RUE AROM (degrees) Overall AROM Right Upper Extremity: Deficits RUE Overall AROM Comments: Shoulder limited to ~90 elevation LUE Assessment LUE Assessment: Exceptions to WFL LUE AROM (degrees) Overall AROM Left Upper Extremity: Deficits LUE Overall AROM Comments: shoulder limited to ~70 degrees elevation Mobility  Bed Mobility Supine to Sit: 1: +1 Total assist;HOB elevated (Comment degrees) (35) Exercises   End of Session OT - End of Session Activity Tolerance: Patient tolerated treatment well Patient left: in bed;with call bell in reach Nurse Communication:  (feeding status) General Behavior During Session:  [facility] for tasks performed Cognition: Impaired   Krzysztof Reichelt, Ursula Alert M 02/19/2012, 2:09 PM

## 2012-02-19 NOTE — Plan of Care (Signed)
Problem: Phase I Progression Outcomes Goal: Initial discharge plan identified Outcome: Completed/Met Date Met:  02/19/12 Family wants pt. To return home

## 2012-02-19 NOTE — Progress Notes (Signed)
PT NOTE: 02/19/2012  Spoke with pt's son at length prior to PT evaluation of pt.  Son gave me the pt's "full" history and expressed his desire to take pt home upon discharge.  Attempt to address some of the son's concerns about his father's difficulty with mobility.  Explained that the mobility difficulty that the son was describing to me at length were consistent with the diagnosis of Parkinson's Disease.  Pt's son appears convinced that mobility will improve when infection is "cleared up".  Will continue to educate family on progressive nature of Parkinson's.  Son did not witness PT session leaving before session started.    Lessie Funderburke L. Skylier Kretschmer DPT [phone]

## 2012-02-20 LAB — CBC
Hemoglobin: 12.3 g/dL — ABNORMAL LOW (ref 13.0–17.0)
MCH: 28.3 pg (ref 26.0–34.0)
MCHC: 32.5 g/dL (ref 30.0–36.0)
RDW: 13.1 % (ref 11.5–15.5)

## 2012-02-20 NOTE — Progress Notes (Signed)
   CARE MANAGEMENT NOTE 02/20/2012  Patient:  Ricky [name], Ricky [name]   Account Number:  [id number]  Date Initiated:  02/17/2012  Documentation initiated by:  Onnie Boer  Subjective/Objective Assessment:   PT WAS ADMITTED WITH SYNCOPE, FALL AND WAS DX WITH UTI     Action/Plan:   PROGRESSION OF CARE AND DISCHARGE PLANNING   Anticipated DC Date:  02/23/2012   Anticipated DC Plan:  SKILLED NURSING FACILITY  In-house referral  Clinical Social Worker      DC Planning Services  CM consult      Choice offered to / List presented to:             Status of service:  In process, will continue to follow Medicare Important Message given?   (If response is "NO", the following Medicare IM given date fields will be blank) Date Medicare IM given:   Date Additional Medicare IM given:    Discharge Disposition:    Per UR Regulation:  Reviewed for med. necessity/level of care/duration of stay  If discussed at Long Length of Stay Meetings, dates discussed:    Comments:   02/20/2012 [address] RN, [place] [phone] Call to daughter [name] [phone] Discussed discharge planning process. She noted the plan to transfer to a SNF. Advised her a SW will call her regarding the search for a SNF.  CM to continue to follow for discharge planning needs. Call to Catha Gosselin SW regarding plan for SNF.  02/20/12 Onnie Boer, RN, BSN 1148 UR COMPLETED  02/17/12 Onnie Boer, RN, BSN 1505 UR COMPLETED

## 2012-02-20 NOTE — Progress Notes (Signed)
Clinical Social Work Department BRIEF PSYCHOSOCIAL ASSESSMENT 02/20/2012  Patient:  Ricky [name], Ricky [name]     Account Number:  [id number]     Admit date:  02/17/2012  Clinical Social Worker:  Margaree Mackintosh  Date/Time:  02/20/2012 02:30 PM  Referred by:  Physician  Date Referred:  02/20/2012 Referred for  SNF Placement   Other Referral:   Interview type:  Patient Other interview type:    PSYCHOSOCIAL DATA Living Status:  ALONE Admitted from facility:   Level of care:   Primary support name:  Amil Bouwman 202) [phone] Primary support relationship to patient:  CHILD, ADULT Degree of support available:   Adequate    CURRENT CONCERNS Current Concerns  Post-Acute Placement   Other Concerns:    SOCIAL WORK ASSESSMENT / PLAN Clinical Social Worker met with pt at bedside, pt currenlty eating lunch.  CSW introduced self and explained role.  CSW confirmed plan for SNF at dc to provide rehab for pt.  Pt stated "Whatever it takes"!  CSW phoned pt's dtr and reviewed plan.  Dtr is supportive of pt dc'ing to SNF to assist with rehab.  Dtr is interested in [facility], but also reviewing other options.  CSW to fax pt out. Dtr expressed interest in having bed offers emailed to her at "[email or web address]".   CSW to continue to follow and assist as needed.   Assessment/plan status:  Psychosocial Support/Ongoing Assessment of Needs Other assessment/ plan:   Information/referral to community resources:    PATIENT'S/FAMILY'S RESPONSE TO PLAN OF CARE: Pt and dtr were both pleasant and engaged.  Pt and dtr thanked CSW for intervention.        Angelia Mould, MSW, [place] [phone]

## 2012-02-20 NOTE — Progress Notes (Signed)
PROGRESS NOTE  Ricky [name] WRU:[id number] DOB: [birth year] DOA: 02/17/2012 PCP: Carollee Herter, MD, MD  Brief narrative: 78 y/o AAM admit with syncope and possible UTI  Past medical history: Vasovagal syncope-multiple x's, Htn, Osteoporosis, ? Gout, freq falls, h/o Pulm embolism 03/2009, bradycardia-now resolved off Cholinesterase inhibitors, Cavitary pneumonia 02/2009, ?Parkinsonism  Consultants:  none  Procedures:  CT head 02/17/12 = no skull fracture or intracranial hemorrhage  2 view chest x-ray 4/2 = MP symmetric, no acute cardiopulmonary disease, stable cardiomegaly without failure  Abdominal x-ray 1 view = moderate stool throughout colon without evidence of obstruction and degenerative changes in lumbar spine and right greater than left  EEG done 02/18/12=Normal  Antibiotics:  None   Subjective  Doing well.  Oriented x3.  Able to tell me where he is, what hospital he is in, and what county/city he is in   Objective   Interim History: Chart reviewed   Objective: Filed Vitals:   02/19/12 2203 02/19/12 2204 02/20/12 0457 02/20/12 0621  BP: 154/98 141/91 180/99 147/89  Pulse: 80 84 75   Temp:   98.8 F (37.1 C)   TempSrc:   Oral   Resp:   24   Height:      Weight:   79.2 kg (174 lb 9.7 oz)   SpO2:   96%     Intake/Output Summary (Last 24 hours) at 02/20/12 1211 Last data filed at 02/20/12 0500  Gross per 24 hour  Intake    220 ml  Output    725 ml  Net   -505 ml    Exam:  General: alert, oriented x 2.  +arcus Cardiovascular: s1 s2 no m/r/g Respiratory: clinically clear.  No added sound  Abdomen: soft, nt/nd Skin no lower extr edema Neuro cn 2-12 grossly intact.  Data Reviewed: Basic Metabolic Panel:  Lab 02/18/12 9147 02/17/12 1519 02/17/12 0941  NA 141 -- 142  K 3.6 -- 4.1  CL 106 -- 104  CO2 26 -- 27  GLUCOSE 98 -- 99  BUN 16 -- 15  CREATININE 0.97 0.96 0.96  CALCIUM 9.1 -- 9.9  MG -- -- --  PHOS -- -- --   Liver Function  Tests:  Lab 02/18/12 0508 02/17/12 0941  AST 17 19  ALT 11 14  ALKPHOS 66 74  BILITOT 0.6 0.7  PROT 7.0 7.7  ALBUMIN 3.4* 3.9   No results found for this basename: LIPASE:5,AMYLASE:5 in the last 168 hours No results found for this basename: AMMONIA:5 in the last 168 hours CBC:  Lab 02/20/12 0506 02/18/12 0508 02/17/12 1519 02/17/12 0941  WBC 6.0 7.4 13.5* 10.8*  NEUTROABS -- -- -- 9.3*  HGB 12.3* 11.7* 12.0* 12.3*  HCT 37.9* 36.8* 37.2* 39.1  MCV 87.3 89.5 87.7 90.1  PLT 154 124* 137* 127*   Cardiac Enzymes:  Lab 02/18/12 0508 02/17/12 2046 02/17/12 1345  CKTOTAL 118 118 105  CKMB 1.8 2.1 2.2  CKMBINDEX -- -- --  TROPONINI <0.30 <0.30 <0.30   BNP: No components found with this basename: POCBNP:5 CBG:  Lab 02/17/12 0936  GLUCAP 100*    Recent Results (from the past 240 hour(s))  URINE CULTURE     Status: Normal (Preliminary result)   Collection Time   02/17/12 11:05 AM      Component Value Range Status Comment   Specimen Description URINE, CATHETERIZED   Final    Special Requests NONE   Final    Culture  Setup Time [id number]  Final    Colony Count PENDING   Incomplete    Culture Culture reincubated for better growth   Final    Report Status PENDING   Incomplete   CULTURE, BLOOD (ROUTINE X 2)     Status: Normal (Preliminary result)   Collection Time   02/17/12 11:48 AM      Component Value Range Status Comment   Specimen Description BLOOD RIGHT HAND   Final    Special Requests BOTTLES DRAWN AEROBIC ONLY 1CC   Final    Culture  Setup Time [id number]   Final    Culture     Final    Value:        BLOOD CULTURE RECEIVED NO GROWTH TO DATE CULTURE WILL BE HELD FOR 5 DAYS BEFORE ISSUING A FINAL NEGATIVE REPORT   Report Status PENDING   Incomplete   CULTURE, BLOOD (ROUTINE X 2)     Status: Normal (Preliminary result)   Collection Time   02/17/12 12:00 PM      Component Value Range Status Comment   Specimen Description BLOOD LEFT WRIST   Final    Special Requests      Final    Value: BOTTLES DRAWN AEROBIC AND ANAEROBIC BLUE 10CC, RED 5CC   Culture  Setup Time [id number]   Final    Culture     Final    Value:        BLOOD CULTURE RECEIVED NO GROWTH TO DATE CULTURE WILL BE HELD FOR 5 DAYS BEFORE ISSUING A FINAL NEGATIVE REPORT   Report Status PENDING   Incomplete   MRSA PCR SCREENING     Status: Normal   Collection Time   02/17/12  7:31 PM      Component Value Range Status Comment   MRSA by PCR NEGATIVE  NEGATIVE  Final      Studies:              All Imaging reviewed and is as per above notation   Scheduled Meds:    . aspirin EC  81 mg Oral Daily  . calcium-vitamin D  1 tablet Oral BID WC  . cefTRIAXone (ROCEPHIN)  IV  1 g Intravenous Q24H  . docusate sodium  100 mg Oral BID  . enoxaparin  40 mg Subcutaneous Q24H  . hydrALAZINE  25 mg Oral Q8H  . levothyroxine  12.5 mcg Oral Q breakfast  . loratadine  10 mg Oral Daily  . memantine  5 mg Oral QPM  . polyethylene glycol  17 g Oral BID  . senna  1 tablet Oral BID   Continuous Infusions:     Assessment/Plan: 1. Syncope-Likely Vasovagal.  Orthostatics are +.  Must consider this a class effect of his Namenda.  Would likely need a lower dose of this.  Await objective evidence to r/o UTI.  D/c IVF.   2. Dementia- EEG normal.  Alzheimer's vs other-placed back on Namenda by ? PCP-Will need Neurology review of his case.  Was apparently taken off of his Parkinson's meds about January 3. Parkinsonism-Not currently on Levo-dopa.  Needs out-patient re-eval.  Was on this but was allegedly d/c Now 2012 4. ? Gout-no current pain issues.  Monitor 5. R/o UTI-UC pending and re-incubated.   Continue Empiric Rocephin d#3-White count has trended down from13.5 on admission.  This would be considered a simple UTI and 3 days IV abx should cover the same--follow up CBC is diminished 6. Hypothyroidism-TSH was slighlty low.  Would consider scaling back  his Thyroxine given his presentation. 7. H/o  IBS/Diverticulosis-stable 8. Htn-not controlled.  Would not use nodal agent.  Would use Hydralazine 25 mg q 8 hourly-consider increase to 50 mg tomorrow if trends remain elevated  9. Anemia-Would not work-up at this age/stage.  Follow trend in 1-2 days with CBC 10. TCP-Has been relatively low since admit.  [place].  If drops, would work-up   Code Status: Full Family Communication: Called daughter Maddux Vanscyoc at listed number [phone]. Updated fully.  Agree to SNF. Disposition Plan: SNF likely.   Pleas Koch, MD  [facility] Pager [phone] 02/20/2012, 12:11 PM    LOS: 3 days

## 2012-02-20 NOTE — Progress Notes (Signed)
Clinical Social Work Department CLINICAL SOCIAL WORK PLACEMENT NOTE 02/20/2012  Patient:  Ricky [name], Ricky [name]  Account Number:  [id number] Admit date:  02/17/2012  Clinical Social Worker:  Margaree Mackintosh  Date/time:  02/20/2012 03:18 PM  Clinical Social Work is seeking post-discharge placement for this patient at the following level of care:   SKILLED NURSING   (*CSW will update this form in Epic as items are completed)   02/20/2012  Patient/family provided with [name] Department of Clinical Social Work's list of facilities offering this level of care within the geographic area requested by the patient (or if unable, by the patient's family).  02/20/2012  Patient/family informed of their freedom to choose among providers that offer the needed level of care, that participate in Medicare, Medicaid or managed care program needed by the patient, have an available bed and are willing to accept the patient.  02/20/2012  Patient/family informed of [facility]' ownership interest in [facility], as well as of the fact that they are under no obligation to receive care at this facility.  PASARR submitted to EDS on 02/20/2012 PASARR number received from EDS on   FL2 transmitted to all facilities in geographic area requested by pt/family on  02/20/2012 FL2 transmitted to all facilities within larger geographic area on   Patient informed that his/her managed care company has contracts with or will negotiate with  certain facilities, including the following:     Patient/family informed of bed offers received:   Patient chooses bed at  Physician recommends and patient chooses bed at    Patient to be transferred to  on   Patient to be transferred to facility by   The following physician request were entered in Epic:   Additional Comments: Dtr requested to be notified by email with bed offers; [email or web address]    Angelia Mould, MSW, [employer] [phone]

## 2012-02-20 NOTE — Progress Notes (Signed)
Physical Therapy Treatment Patient Details Name: Ricky [name] MRN: [id number] DOB: 1917/04/17 Today's Date: 02/20/2012  PT Assessment/Plan  PT - Assessment/Plan Comments on Treatment Session: pt rpesents grossly weak and debilitated.  pt requires extensive A for all mobility.  pt very rigid and limited ability to initiate any movement.   PT Plan: Discharge plan remains appropriate;Discharge plan needs to be updated;Frequency remains appropriate PT Frequency: Min 3X/week Recommendations for Other Services: OT consult;Rehab consult Follow Up Recommendations: Inpatient Rehab;Skilled nursing facility Equipment Recommended: Wheelchair (measurements);3 in 1 bedside comode PT Goals  Acute Rehab PT Goals PT Goal: Rolling Supine to Right Side - Progress: Progressing toward goal PT Goal: Rolling Supine to Left Side - Progress: Progressing toward goal PT Goal: Supine/Side to Sit - Progress: Progressing toward goal PT Goal: Sit at Edge Of Bed - Progress: Progressing toward goal PT Goal: Sit to Stand - Progress: Progressing toward goal PT Goal: Stand to Sit - Progress: Progressing toward goal PT Transfer Goal: Bed to Chair/Chair to Bed - Progress: Progressing toward goal  PT Treatment Precautions/Restrictions  Precautions Precautions: Fall Restrictions Weight Bearing Restrictions: No Mobility (including Balance) Bed Mobility Bed Mobility: Yes Rolling Left: 1: +2 Total assist;Patient percentage (comment) (pt 10%) Rolling Left Details (indicate cue type and reason): pt very rigid.  pt verbalizes trying to roll L, however pt seems unable to process initiating this movement.   Left Sidelying to Sit: 1: +2 Total assist;Patient percentage (comment) (pt 10%) Left Sidelying to Sit Details (indicate cue type and reason): cues for use of UEs to A, however pt with very limited participation.   Sitting - Scoot to Edge of Bed: 1: +2 Total assist;Patient percentage (comment) (pt 0%) Sitting - Scoot to  Edge of Bed Details (indicate cue type and reason): pt total A using pad to scoot to EOB.   Transfers Transfers: Yes Squat Pivot Transfers: 1: +2 Total assist;Patient percentage (comment) (pt 20%) Squat Pivot Transfer Details (indicate cue type and reason): pt able to bear some wt thru LEs, but very little A with use of UEs or during pivot.  pt grabs PT with one hand and the bed sheet with the other hand.   Ambulation/Gait Ambulation/Gait: No Stairs: No Wheelchair Mobility Wheelchair Mobility: No  Posture/Postural Control Posture/Postural Control: Postural limitations Postural Limitations: pt very rigid with increased tone throughout body.  pt kyphotic and unable to extend neck/trunk.   Balance Balance Assessed: No Exercise    End of Session PT - End of Session Equipment Utilized During Treatment: Gait belt Activity Tolerance: Patient tolerated treatment well Patient left: in chair;with call bell in reach Nurse Communication: Mobility status for transfers;Need for lift equipment General Behavior During Session: [facility] for tasks performed Cognition: Impaired, at baseline  Sunny Schlein, [place] [phone] 02/20/2012, 10:41 AM

## 2012-02-20 NOTE — Progress Notes (Signed)
Occupational Therapy Treatment Patient Details Name: Ricky [name] MRN: [id number] DOB: December 16, 1916 Today's Date: 02/20/2012  OT Assessment/Plan OT Assessment/Plan Comments on Treatment Session: pleasant, cooperative.  Needs multiple verbal cues; slow to respond OT Frequency: Min 2X/week Follow Up Recommendations: Home health OT;Supervision/Assistance - 24 hour;Other (comment) (as long as family can manage) Equipment Recommended: Wheelchair (measurements);3 in 1 bedside comode OT Goals ADL Goals Pt Will Perform Grooming: with mod assist;Supported;Sitting, chair;Supine, head of bed up ADL Goal: Grooming - Progress: Progressing toward goals  OT Treatment Precautions/Restrictions  Precautions Precautions: Fall Restrictions Weight Bearing Restrictions: No   ADL ADL Grooming: Performed;Teeth care;Brushing hair;Other (comment);Wash/dry face;Wash/dry hands (face mod A; teeth and hair max A;max cues for all) Grooming Details (indicate cue type and reason): difficulty manipulating toothbrush; did rinse and spit/wipe mouth.  Min A to open toothpaste lid ADL Comments: used primarily RUE, but did use Bil UEs for face Mobility   Exercises    End of Session OT - End of Session Activity Tolerance: Patient tolerated treatment well General Behavior During Session: [facility] for tasks performed Cognition: Impaired Cognitive Impairment: oriented to self and [name] only; unable to read calendar [name], OTR/L [phone] 02/20/2012 [name]  02/20/2012, 11:06 AM

## 2012-02-21 NOTE — Progress Notes (Signed)
Patient had 02 removed and pulse ox was done with 94% on roomair after 1 hour and maintained at that level. Rosanna Bickle,RN.

## 2012-02-21 NOTE — Progress Notes (Signed)
PROGRESS NOTE  Ricky [name]:[id number] DOB: 1916-11-28 DOA: 02/17/2012 PCP: Carollee Herter, MD, MD  Brief narrative: 78 y/o AAM admit with syncope and possible UTI  Past medical history: Vasovagal syncope-multiple x's, Htn, Osteoporosis, ? Gout, freq falls, h/o Pulm embolism 03/2009, bradycardia-now resolved off Cholinesterase inhibitors, Cavitary pneumonia 02/2009, ?Parkinsonism  Consultants:  none  Procedures:  CT head 02/17/12 = no skull fracture or intracranial hemorrhage  2 view chest x-ray 4/2 = MP symmetric, no acute cardiopulmonary disease, stable cardiomegaly without failure  Abdominal x-ray 1 view = moderate stool throughout colon without evidence of obstruction and degenerative changes in lumbar spine and right greater than left  EEG done 02/18/12=Normal  Antibiotics:  None   Subjective  Doing well.  Oriented x3.  Able to tell me where he is, what hospital he is in, and what county/city he is in, who is the president. Didn't like the lunch today. No cp/sob/n/v/cp No other c/o   Objective   Interim History: Chart reviewed   Objective: Filed Vitals:   02/21/12 0500 02/21/12 0505 02/21/12 0548 02/21/12 1414  BP: 166/96 162/94 162/94 153/89  Pulse: 80   95  Temp: 98.9 F (37.2 C)   97.8 F (36.6 C)  TempSrc: Oral     Resp:    19  Height:      Weight: 76.8 kg (169 lb 5 oz)     SpO2: 95%   95%    Intake/Output Summary (Last 24 hours) at 02/21/12 1553 Last data filed at 02/21/12 1414  Gross per 24 hour  Intake    840 ml  Output    750 ml  Net     90 ml    Exam:  General: alert, oriented x 2.  +arcus Cardiovascular: s1 s2 no m/r/g Respiratory: clinically clear.  No added sound  Abdomen: soft, nt/nd Skin no lower extr edema Neuro cn 2-12 grossly intact.  Data Reviewed: Basic Metabolic Panel:  Lab 02/18/12 3474 02/17/12 1519 02/17/12 0941  NA 141 -- 142  K 3.6 -- 4.1  CL 106 -- 104  CO2 26 -- 27  GLUCOSE 98 -- 99  BUN 16 -- 15    CREATININE 0.97 0.96 0.96  CALCIUM 9.1 -- 9.9  MG -- -- --  PHOS -- -- --   Liver Function Tests:  Lab 02/18/12 0508 02/17/12 0941  AST 17 19  ALT 11 14  ALKPHOS 66 74  BILITOT 0.6 0.7  PROT 7.0 7.7  ALBUMIN 3.4* 3.9   No results found for this basename: LIPASE:5,AMYLASE:5 in the last 168 hours No results found for this basename: AMMONIA:5 in the last 168 hours CBC:  Lab 02/20/12 0506 02/18/12 0508 02/17/12 1519 02/17/12 0941  WBC 6.0 7.4 13.5* 10.8*  NEUTROABS -- -- -- 9.3*  HGB 12.3* 11.7* 12.0* 12.3*  HCT 37.9* 36.8* 37.2* 39.1  MCV 87.3 89.5 87.7 90.1  PLT 154 124* 137* 127*   Cardiac Enzymes:  Lab 02/18/12 0508 02/17/12 2046 02/17/12 1345  CKTOTAL 118 118 105  CKMB 1.8 2.1 2.2  CKMBINDEX -- -- --  TROPONINI <0.30 <0.30 <0.30   BNP: No components found with this basename: POCBNP:5 CBG:  Lab 02/17/12 0936  GLUCAP 100*    Recent Results (from the past 240 hour(s))  URINE CULTURE     Status: Normal (Preliminary result)   Collection Time   02/17/12 11:05 AM      Component Value Range Status Comment   Specimen Description URINE, CATHETERIZED  Final    Special Requests NONE   Final    Culture  Setup Time [id number]   Final    Colony Count PENDING   Incomplete    Culture Culture reincubated for better growth   Final    Report Status PENDING   Incomplete   CULTURE, BLOOD (ROUTINE X 2)     Status: Normal (Preliminary result)   Collection Time   02/17/12 11:48 AM      Component Value Range Status Comment   Specimen Description BLOOD RIGHT HAND   Final    Special Requests BOTTLES DRAWN AEROBIC ONLY 1CC   Final    Culture  Setup Time [id number]   Final    Culture     Final    Value:        BLOOD CULTURE RECEIVED NO GROWTH TO DATE CULTURE WILL BE HELD FOR 5 DAYS BEFORE ISSUING A FINAL NEGATIVE REPORT   Report Status PENDING   Incomplete   CULTURE, BLOOD (ROUTINE X 2)     Status: Normal (Preliminary result)   Collection Time   02/17/12 12:00 PM      Component  Value Range Status Comment   Specimen Description BLOOD LEFT WRIST   Final    Special Requests     Final    Value: BOTTLES DRAWN AEROBIC AND ANAEROBIC BLUE 10CC, RED 5CC   Culture  Setup Time [id number]   Final    Culture     Final    Value:        BLOOD CULTURE RECEIVED NO GROWTH TO DATE CULTURE WILL BE HELD FOR 5 DAYS BEFORE ISSUING A FINAL NEGATIVE REPORT   Report Status PENDING   Incomplete   MRSA PCR SCREENING     Status: Normal   Collection Time   02/17/12  7:31 PM      Component Value Range Status Comment   MRSA by PCR NEGATIVE  NEGATIVE  Final      Studies:              All Imaging reviewed and is as per above notation   Scheduled Meds:    . aspirin EC  81 mg Oral Daily  . calcium-vitamin D  1 tablet Oral BID WC  . docusate sodium  100 mg Oral BID  . enoxaparin  40 mg Subcutaneous Q24H  . hydrALAZINE  25 mg Oral Q8H  . levothyroxine  12.5 mcg Oral Q breakfast  . loratadine  10 mg Oral Daily  . memantine  5 mg Oral QPM  . polyethylene glycol  17 g Oral BID  . senna  1 tablet Oral BID   Continuous Infusions:     Assessment/Plan: 1. Syncope-Likely Vasovagal.  Orthostatics are +.  Must consider this a class effect of his Namenda.  Would likely need a lower dose of this.  Await objective evidence to r/o UTI.  D/c IVF.   2. Dementia- EEG normal.  Alzheimer's vs other-placed back on Namenda by ? PCP-Will need Neurology review of his case.  Was apparently taken off of his Parkinson's meds about January 3. Parkinsonism-Not currently on Levo-dopa.  Needs out-patient re-eval.   4. ? Gout-no current pain issues.  Monitor 5. R/o UTI-UC pending and re-incubated.   Continue Empiric Rocephin d#3-White count has trended down from13.5 on admission.  This would be considered a simple UTI and 3 days IV abx should cover the same--follow up CBC is diminished, he has had no follow-up fever other than some  low grade temp last night.  Will reassess in am w CBC 6. Hypothyroidism-TSH was  slighlty low.  Would consider scaling back his Thyroxine given his presentation. 7. H/o IBS/Diverticulosis-stable 8. Htn-not controlled.  Would not use nodal agent.  Would use Hydralazine 25 mg q 8 hourly-would not aggreesively control  9. Anemia-Would not work-up at this age/stage.  Follow trend in 1-2 days with CBC 10. TCP-Has been relatively low since admit.  [place].  If drops, would work-up   Code Status: Full Family Communication: none at bedside.  Will report to family tomorrow POC and disposition Disposition Plan: SNF likely.   Pleas Koch, MD  [facility] Pager [phone] 02/21/2012, 3:53 PM    LOS: 4 days

## 2012-02-22 ENCOUNTER — Inpatient Hospital Stay (HOSPITAL_COMMUNITY): Payer: Medicare Other

## 2012-02-22 LAB — BASIC METABOLIC PANEL
Calcium: 9.4 mg/dL (ref 8.4–10.5)
Chloride: 103 mEq/L (ref 96–112)
Creatinine, Ser: 1.44 mg/dL — ABNORMAL HIGH (ref 0.50–1.35)
GFR calc Af Amer: 47 mL/min — ABNORMAL LOW (ref >90)
Sodium: 139 mEq/L (ref 135–145)

## 2012-02-22 LAB — URINE CULTURE
Colony Count: >=100000
Culture  Setup Time: 201304021151

## 2012-02-22 LAB — CBC
MCV: 89.2 fL (ref 78.0–100.0)
Platelets: 160 10*3/uL (ref 150–400)
RDW: 13.3 % (ref 11.5–15.5)
WBC: 5.3 10*3/uL (ref 4.0–10.5)

## 2012-02-22 MED ORDER — NITROFURANTOIN MONOHYD MACRO 100 MG PO CAPS
100.0000 mg | ORAL_CAPSULE | Freq: Two times a day (BID) | ORAL | Status: DC
  Administered 2012-02-22 – 2012-02-23 (×2): 100 mg via ORAL
  Filled 2012-02-22 (×3): qty 1

## 2012-02-22 NOTE — Progress Notes (Signed)
PROGRESS NOTE  Ricky [name] WGN:[id number] DOB: [birth year] DOA: 02/17/2012 PCP: Carollee Herter, MD, MD  Brief narrative: 77 y/o AAM admit with syncope and possible UTI  Past medical history: Vasovagal syncope-multiple x's, Htn, Osteoporosis, ? Gout, freq falls, h/o Pulm embolism 03/2009, bradycardia-now resolved off Cholinesterase inhibitors, Cavitary pneumonia 02/2009, ?Parkinsonism  Consultants:  none  Procedures:  CT head 02/17/12 = no skull fracture or intracranial hemorrhage  2 view chest x-ray 4/2 = MP symmetric, no acute cardiopulmonary disease, stable cardiomegaly without failure  Abdominal x-ray 1 view = moderate stool throughout colon without evidence of obstruction and degenerative changes in lumbar spine and right greater than left  EEG done 02/18/12=Normal  Antibiotics:  None   Subjective  Not doing as well today.  Desatted per RN to the 80's on RA.  No specific c/o, but not as oriented.  No cp/n/v Doesn't c/o cough or cold or other issues.  Hasn;t eaten this pm and hardly had breakfast    Objective   Interim History: Chart reviewed   Objective: Filed Vitals:   02/21/12 2116 02/22/12 0500 02/22/12 0520 02/22/12 0521  BP: 134/76  134/78 147/96  Pulse: 101  95 67  Temp: 98.3 F (36.8 C)  98.4 F (36.9 C)   TempSrc: Oral  Oral   Resp: 18  20   Height:      Weight:  78.7 kg (173 lb 8 oz)    SpO2: 93%  95% 95%    Intake/Output Summary (Last 24 hours) at 02/22/12 1446 Last data filed at 02/22/12 1100  Gross per 24 hour  Intake    480 ml  Output    450 ml  Net     30 ml    Exam:  General: alert, oriented x 2.  +arcus Cardiovascular: s1 s2 no m/r/g Respiratory: decreased bs's bilat.  No tvr/tvf , but poor effort. Abdomen: soft, nt/nd Skin no lower extr edema Neuro cn 2-12 grossly intact.  Data Reviewed: Basic Metabolic Panel:  Lab 02/18/12 7846 02/17/12 1519 02/17/12 0941  NA 141 -- 142  K 3.6 -- 4.1  CL 106 -- 104  CO2 26 -- 27    GLUCOSE 98 -- 99  BUN 16 -- 15  CREATININE 0.97 0.96 0.96  CALCIUM 9.1 -- 9.9  MG -- -- --  PHOS -- -- --   Liver Function Tests:  Lab 02/18/12 0508 02/17/12 0941  AST 17 19  ALT 11 14  ALKPHOS 66 74  BILITOT 0.6 0.7  PROT 7.0 7.7  ALBUMIN 3.4* 3.9   No results found for this basename: LIPASE:5,AMYLASE:5 in the last 168 hours No results found for this basename: AMMONIA:5 in the last 168 hours CBC:  Lab 02/22/12 0425 02/20/12 0506 02/18/12 0508 02/17/12 1519 02/17/12 0941  WBC 5.3 6.0 7.4 13.5* 10.8*  NEUTROABS -- -- -- -- 9.3*  HGB 12.3* 12.3* 11.7* 12.0* 12.3*  HCT 38.7* 37.9* 36.8* 37.2* 39.1  MCV 89.2 87.3 89.5 87.7 90.1  PLT 160 154 124* 137* 127*   Cardiac Enzymes:  Lab 02/18/12 0508 02/17/12 2046 02/17/12 1345  CKTOTAL 118 118 105  CKMB 1.8 2.1 2.2  CKMBINDEX -- -- --  TROPONINI <0.30 <0.30 <0.30   BNP: No components found with this basename: POCBNP:5 CBG:  Lab 02/17/12 0936  GLUCAP 100*    Recent Results (from the past 240 hour(s))  URINE CULTURE     Status: Normal (Preliminary result)   Collection Time   02/17/12 11:05 AM  Component Value Range Status Comment   Specimen Description URINE, CATHETERIZED   Final    Special Requests NONE   Final    Culture  Setup Time [id number]   Final    Colony Count PENDING   Incomplete    Culture Culture reincubated for better growth   Final    Report Status PENDING   Incomplete   CULTURE, BLOOD (ROUTINE X 2)     Status: Normal (Preliminary result)   Collection Time   02/17/12 11:48 AM      Component Value Range Status Comment   Specimen Description BLOOD RIGHT HAND   Final    Special Requests BOTTLES DRAWN AEROBIC ONLY 1CC   Final    Culture  Setup Time [id number]   Final    Culture     Final    Value:        BLOOD CULTURE RECEIVED NO GROWTH TO DATE CULTURE WILL BE HELD FOR 5 DAYS BEFORE ISSUING A FINAL NEGATIVE REPORT   Report Status PENDING   Incomplete   CULTURE, BLOOD (ROUTINE X 2)     Status:  Normal (Preliminary result)   Collection Time   02/17/12 12:00 PM      Component Value Range Status Comment   Specimen Description BLOOD LEFT WRIST   Final    Special Requests     Final    Value: BOTTLES DRAWN AEROBIC AND ANAEROBIC BLUE 10CC, RED 5CC   Culture  Setup Time [id number]   Final    Culture     Final    Value:        BLOOD CULTURE RECEIVED NO GROWTH TO DATE CULTURE WILL BE HELD FOR 5 DAYS BEFORE ISSUING A FINAL NEGATIVE REPORT   Report Status PENDING   Incomplete   MRSA PCR SCREENING     Status: Normal   Collection Time   02/17/12  7:31 PM      Component Value Range Status Comment   MRSA by PCR NEGATIVE  NEGATIVE  Final      Studies:              All Imaging reviewed and is as per above notation   Scheduled Meds:    . aspirin EC  81 mg Oral Daily  . calcium-vitamin D  1 tablet Oral BID WC  . docusate sodium  100 mg Oral BID  . enoxaparin  40 mg Subcutaneous Q24H  . hydrALAZINE  25 mg Oral Q8H  . levothyroxine  12.5 mcg Oral Q breakfast  . loratadine  10 mg Oral Daily  . memantine  5 mg Oral QPM  . polyethylene glycol  17 g Oral BID  . senna  1 tablet Oral BID   Continuous Infusions:     Assessment/Plan: 1. ? Sepsis-could be from Rx UTi, could be new onset PNA, but no fever, no white count-get stat CXR and work-up further 2. Syncope-Likely Vasovagal.  Orthostatics are +.  Must consider this a class effect of his Namenda.  Would likely need a lower dose of this.  Still trying to r/o UTI.  D/c IVF.   3. Dementia- EEG normal.  Alzheimer's vs other-placed back on Namenda by ? PCP-Will need Neurology review of his case.  Was apparently taken off of his Parkinson's meds about January 4. Parkinsonism-Not currently on Levo-dopa.  Needs out-patient re-eval.   5. ? Gout-no current pain issues.   6. R/o UTI-UC pending and re-incubated.   Continue Empiric Rocephin d#3-White count has  trended down from13.5 on admission.  This would be considered a simple UTI and 3 days IV abx  should cover the same--he might be having a recurrence of this and we will request the lab get [place] the information on the UC.  I will hold further abx at present time. 7. Hypothyroidism-TSH was slighlty low.  Would consider scaling back his Thyroxine given his presentation. 8. H/o IBS/Diverticulosis-stable 9. Htn-not controlled.  Would not use nodal agent.  Would use Hydralazine 25 mg q 8 hourly-would not aggreesively control  10. Anemia-Would not work-up at this age/stage.  Follow trend in 1-2 days with CBC 11. TCP-Has been relatively low since admit.  [place].  If drops, would work-up   Code Status: Full Family Communication: none at bedside.  Will report to family tomorrow POC and disposition Disposition Plan: SNF likely.   Pleas Koch, MD  [facility] Pager [phone] 02/22/2012, 2:46 PM    LOS: 5 days

## 2012-02-23 LAB — CULTURE, BLOOD (ROUTINE X 2)
Culture  Setup Time: 201304021651
Culture: NO GROWTH

## 2012-02-23 MED ORDER — NITROFURANTOIN MONOHYD MACRO 100 MG PO CAPS: 100.0000 mg | ORAL_CAPSULE | Freq: Two times a day (BID) | ORAL | Status: AC

## 2012-02-23 MED ORDER — HYDRALAZINE HCL 25 MG PO TABS: 25.0000 mg | ORAL_TABLET | Freq: Three times a day (TID) | ORAL | Status: DC

## 2012-02-23 NOTE — Progress Notes (Signed)
OT Cancellation Note  Treatment cancelled today due to patient's refusal to participate.  [facility] Neu, OTR/L  [phone] 02/23/2012 02/23/2012, 11:31 AM

## 2012-02-23 NOTE — Discharge Summary (Signed)
Date of Admission: 02/17/2012  9:10 AM Admitter: @ADMITPROV @   Date of Discharge4/06/2012 Attending Physician: Rhetta Mura, MD  Things to Follow-up on: Needs follow up cbc+bmet in 3-5 days-will need to force fluids by mouth about 2 L a day Needs re-evaluation by Neurology for Parkinson's-was taken off meds in Jan by neurologist Will likely need frequent voiding.   [facility] Discharge Summary Ricky [name]:[id number] DOB: [birth year] DOA: 02/17/2012 PCP: Carollee Herter, MD, MD  Brief narrative: 78 y/o AAM admit with syncope and possible UTI  Past medical history: Vasovagal syncope-multiple x's, Htn, Osteoporosis, ? Gout, freq falls, h/o Pulm embolism 03/2009, bradycardia-now resolved off Cholinesterase inhibitors, Cavitary pneumonia 02/2009, ?Parkinsonism  Consultants:  none  Procedures:  CT head 02/17/12 = no skull fracture or intracranial hemorrhage   2 view chest x-ray 4/2 = MP symmetric, no acute cardiopulmonary disease, stable cardiomegaly without failure   Abdominal x-ray 1 view = moderate stool throughout colon without evidence of obstruction and degenerative changes in lumbar spine and right greater than left   EEG done 02/18/12=Normal  Chest x-ray 4/7= chest x-ray no significant change  Urine cult 4/2= >100,000 Enterococcus sensitive to ampicillin, nitrofurantoin, vancomycin  Antibiotics:  Rocephin 1000mg  4/2->4/4  Macrobid 100 bid 4/7-4/12     Hospital Course by problem list: Assessment/Plan: 1. ? Sepsis-could be from Rx UTi, could be new onset PNA, but no fever, no white count-get stat CXR and work-up further-patient's urine culture returned and this is likely more of a urinary tract infection therefore in the setting of a normal chest x-ray that was done as per above we will continue treatment for slightly longer period of time with nitroglycerin and turned 100 mg twice a day. Patient was back to her premorbid baseline and was eating  and drinking and was more responsive. 2. Syncope-Likely Vasovagal.  Orthostatics are +.  Must consider this a class effect of his Namenda.  Would likely need a lower dose of this.  D/c IVF.  Patient was taken off other antihypertensive medications (lisinopril) and hydralazine which is a non-AV nodal agent was increased from 10 mg to 25 mg 3 times a day. This will need to be checked in the outpatient setting and reconfirmed in terms of orthostasis. This could have been a class effect of his Namenda which may need to be discontinued. Please see below 3. Dementia- EEG normal.  Alzheimer's vs other-placed back on Namenda by ? PCP, despite the fact the patient has a history of bradycardia with regards to these medications-Will need Neurology/PCP review of his case.  Was apparently taken off of his Parkinson's meds about January-will need a 6 week reevaluation regarding the same.  I believe his dementia is currently mild and he seems clearer when I discuss with family on a day-to-day basis then prior 4. Parkinsonism-Not currently on Levo-dopa.  Needs out-patient re-eval.     5. ? Gout-no current pain issues.     6. Enterococcal UTI-patient was initially treated with 3 doses of IV Rocephin but the urine culture is pending. Patient developed a fever over the course of 46 and 02/22/2012 but this subsequently resolved and patient's urine culture came measuring enterococcus. Given this finding we have elected she can with Macrobid which the organism is sensitive and patient will continue same for 4 days. Patient return to premorbid state without any further issues and is deemed stable for discharge back to facility.  7. Hypothyroidism-TSH was slighlty low.  Would consider scaling back his Thyroxine given  his presentation.  8. H/o IBS/Diverticulosis-stable  9. Htn-not controlled.  Would not use nodal agent.  Would use Hydralazine 25 mg q 8 hourly-would not aggreesively control    10. Anemia-Would not work-up at this  age/stage.  Follow trend in 1-2 days with CBC  11. TCP-Has been relatively low since admit from 4/1/2 to a baseline of 160.  this resolved in the inpatient setting. 12. Acute kidney injury-he developed some confusion day prior to discharge it was noted that his BUN and creatinine on basal metabolic panel was slightly elevated to 1.44 from prior 1.0. Was thought that some of his confusion and his fever may have resulted from the same. I will recommend him forcing fluids on him given his mild renal decompensation but will need to be met in 1-2 days    Procedures Performed and pertinent labs: Dg Chest 2 View  02/17/2012  *RADIOLOGY REPORT*  Clinical Data: Pain.  Cough.  CHEST - 2 VIEW  Comparison: Portable chest x-ray 11/16/2011.  CTA chest 10/22/2010.  Findings: Cardiac enlargement is stable.  Emphysematous changes are again noted.  Mild bibasilar atelectasis is present without significant airspace disease otherwise.  Exaggerated thoracic kyphosis is similar to the prior exam.  IMPRESSION:  1.  Emphysema. 2.  No acute cardiopulmonary disease or significant interval change. 3.  Stable cardiomegaly without failure.  Original Report Authenticated By: Jamesetta Orleans. MATTERN, M.D.   Dg Abd 1 View  02/17/2012  *RADIOLOGY REPORT*  Clinical Data: Lethargy and constipation.  ABDOMEN - 1 VIEW  Comparison: CT abdomen and pelvis 07/19/2004.  Findings: Moderate stool is present throughout the colon.  There is no significant dilation or focal obstruction.  The small bowel is unremarkable.  Degenerative changes are again noted within the lumbar spine.  Degenerative changes are present in the hips, right greater than left.  IMPRESSION:  1.  Moderate stool throughout the colon without evidence for obstruction. 2.  Degenerative changes in the lumbar spine and hips, right greater than left.  Original Report Authenticated By: Jamesetta Orleans. MATTERN, M.D.   Ct Head Wo Contrast  02/17/2012  *RADIOLOGY REPORT*  Clinical Data:  Fall.  Possible syncopal episode.  CT HEAD WITHOUT CONTRAST  Technique:  Contiguous axial images were obtained from the base of the skull through the vertex without contrast.  Comparison: 11/16/2011.  Findings: Motion degraded exam.  No skull fracture or intracranial hemorrhage.  Global atrophy. Ventricular prominence may be related to atrophy without change.  Difficult to exclude a very mild component of hydrocephalus.  Moderate small vessel disease type changes. No CT evidence of large acute infarct. No intracranial mass lesion detected on this unenhanced exam.  Polypoid opacification sphenoid sinus air cells.  IMPRESSION: No skull fracture or intracranial hemorrhage.  Atrophy with ventricular prominence similar to the prior exam.  Prominent small vessel disease type changes.  Polypoid opacification sphenoid sinus air cells.  Original Report Authenticated By: Fuller Canada, M.D.   Dg Chest Port 1 View  02/22/2012  *RADIOLOGY REPORT*  Clinical Data: Chest pain.  Weakness.  PORTABLE CHEST - 1 VIEW  Comparison: 02/17/2012.  Findings: Cardiomegaly.  Central pulmonary vascular prominence. Calcified tortuous aorta.  Basilar subsegmental atelectasis/scarring.  IMPRESSION: No significant change.  Original Report Authenticated By: Fuller Canada, M.D.    Discharge Vitals & PE:  BP 130/88  Pulse 92  Temp(Src) 98 F (36.7 C) (Oral)  Resp 20  Ht 6\' 1"  (1.854 m)  Wt 78.7 kg (173 lb 8 oz)  BMI  22.89 kg/m2  SpO2 95% Alert, more oriented no c/o today.  Ate  Full meal.  Conversant and not confused  And in process of getting bath.  Nursing reports one to 2 stools daily with numerous laxatives  HEENT-Arcus senilis, no pallor, no icterus Chest clinically clear no added sound S1-S2 no murmur rub or gallop, mild jugular venous distention Abdomen soft nontender nondistended Skin-has some skin tears on the upper back Neurologically intact, able to move all 4 limbs but slowly, kyphotic  Discharge Labs:  Results  for orders placed during the hospital encounter of 02/17/12 (from the past 24 hour(s))  BASIC METABOLIC PANEL     Status: Abnormal   Collection Time   02/22/12  3:00 PM      Component Value Range   Sodium 139  135 - 145 (mEq/L)   Potassium 3.7  3.5 - 5.1 (mEq/L)   Chloride 103  96 - 112 (mEq/L)   CO2 22  19 - 32 (mEq/L)   Glucose, Bld 108 (*) 70 - 99 (mg/dL)   BUN 34 (*) 6 - 23 (mg/dL)   Creatinine, Ser 4.09 (*) 0.50 - 1.35 (mg/dL)   Calcium 9.4  8.4 - 81.1 (mg/dL)   GFR calc non Af Amer 41 (*) >90 (mL/min)   GFR calc Af Amer 47 (*) >90 (mL/min)    Disposition and follow-up:   Ricky [name] was discharged from in fair condition.    Follow-up Appointments:  Follow-up Information    Follow up with Carollee Herter, MD in 1 month.      Follow up with Evie Lacks, MD in 6 weeks.   Contact information:   [address] [place] [address] [phone]          Discharge Medications: Medication List  As of 02/23/2012 10:47 AM   STOP taking these medications         guaifenesin 100 MG/5ML syrup      lisinopril 20 MG tablet         TAKE these medications         acetaminophen 500 MG tablet   Commonly known as: TYLENOL   Take 500 mg by mouth 2 (two) times daily.      alendronate 70 MG tablet   Commonly known as: FOSAMAX   Take 70 mg by mouth every 7 (seven) days. Takes on Thursdays. Take with a full glass of water on an empty stomach.  Hold while in hospital      aspirin EC 81 MG tablet   Take 1 tablet (81 mg total) by mouth daily.      calcium-vitamin D 500-200 MG-UNIT per tablet   Commonly known as: OSCAL WITH D   Take 1 tablet by mouth 2 (two) times daily.      hydrALAZINE 25 MG tablet   Commonly known as: APRESOLINE   Take 1 tablet (25 mg total) by mouth every 8 (eight) hours.      levothyroxine 25 MCG tablet   Commonly known as: SYNTHROID, LEVOTHROID   Take 25 mcg by mouth daily.      loratadine 10 MG tablet   Commonly  known as: CLARITIN   Take 10 mg by mouth daily.      memantine 10 MG tablet   Commonly known as: NAMENDA   Take 10 mg by mouth every evening.      multivitamin with minerals tablet   Take 1 tablet by mouth daily.  ICAPS AREDS FORMULA PO   Take 2 capsules by mouth 2 (two) times daily.      nitrofurantoin (macrocrystal-monohydrate) 100 MG capsule   Commonly known as: MACROBID   Take 1 capsule (100 mg total) by mouth every 12 (twelve) hours.           Medications Discontinued During This Encounter  Medication Reason  . levothyroxine (SYNTHROID, LEVOTHROID) 25 MCG tablet Entry Error  . lisinopril (PRINIVIL,ZESTRIL) 20 MG tablet Entry Error  . acetaminophen (TYLENOL) 650 MG CR tablet Change in therapy  . calcium carbonate (OS-CAL - DOSED IN MG OF ELEMENTAL CALCIUM) 1250 MG tablet Change in therapy  . NAMENDA 10 MG tablet Entry Error  . acetaminophen (TYLENOL) tablet 650 mg   . alendronate (FOSAMAX) tablet 70 mg Not available  . 0.9 %  sodium chloride infusion   . guaifenesin (ROBITUSSIN) 100 MG/5ML syrup 200 mg Entry Error  . guaifenesin (ROBITUSSIN) 100 MG/5ML syrup   . levothyroxine (SYNTHROID, LEVOTHROID) tablet 25 mcg   . 0.9 %  sodium chloride infusion   . memantine (NAMENDA) tablet 10 mg   . hydrALAZINE (APRESOLINE) tablet 10 mg     > 40 minutes time spent preparing d/c summary, including direct face-face patient Time, contact with consultants, family and care coordination   Signed: Halyn Flaugher,JAI 02/23/2012, 10:28 AM

## 2012-02-23 NOTE — Progress Notes (Signed)
02/23/12 NSG 1815 Pt. Being d/c' to SNF for rehab.  D/C packet and belongings sent with pt.  IV d/c'd  & catheter intact.  Skin intact at d/c, except for abrasion to middle back with tegaderm intact.   Pt. Escorted to SNF via stretcher by ambulance.  Forbes Cellar, RN

## 2012-02-23 NOTE — Progress Notes (Signed)
CSW spoke with pt daughter regarding bed offers and that patient is medically stable. Pt daughter to call back by 12pm regarding bed choice for patient to transition to today. CSW to follow up further with pt after speaking with pt daughter.   Catha Gosselin, Theresia Majors  [phone] .02/23/2012 1135am

## 2012-02-23 NOTE — Progress Notes (Signed)
.  Clinical social worker assisted with patient discharge to skilled nursing facility, [facility]. .Patient transportation provided by [employer] [facility] with patient chart copy. .No further Clinical Social Work needs, signing off.    Catha Gosselin, Theresia Majors  [phone] .02/23/2012  1706pm

## 2012-02-23 NOTE — Telephone Encounter (Signed)
TSD  

## 2012-02-23 NOTE — Progress Notes (Signed)
PT Cancellation Note  Treatment cancelled today due to per CSW note, pt to d/c SNF today. Will defer further therapy to SNF. Humberto Seals HELEN 02/23/2012, 11:50 AM

## 2012-04-16 ENCOUNTER — Other Ambulatory Visit: Payer: Self-pay

## 2012-04-16 ENCOUNTER — Ambulatory Visit (INDEPENDENT_AMBULATORY_CARE_PROVIDER_SITE_OTHER): Payer: Medicare Other | Admitting: Family Medicine

## 2012-04-16 ENCOUNTER — Encounter: Payer: Self-pay | Admitting: Family Medicine

## 2012-04-16 VITALS — BP 116/70 | HR 85 | Temp 99.0°F

## 2012-04-16 DIAGNOSIS — L89899 Pressure ulcer of other site, unspecified stage: Secondary | ICD-10-CM

## 2012-04-16 DIAGNOSIS — N390 Urinary tract infection, site not specified: Secondary | ICD-10-CM

## 2012-04-16 DIAGNOSIS — R05 Cough: Secondary | ICD-10-CM

## 2012-04-16 DIAGNOSIS — D649 Anemia, unspecified: Secondary | ICD-10-CM

## 2012-04-16 LAB — POCT URINALYSIS DIPSTICK
Ketones, UA: NEGATIVE
Protein, UA: 100

## 2012-04-16 MED ORDER — AMOXICILLIN 875 MG PO TABS: 875.0000 mg | ORAL_TABLET | Freq: Two times a day (BID) | ORAL | Status: AC

## 2012-04-16 NOTE — Progress Notes (Signed)
Addended by: Ronnald Nian on: 04/16/2012 04:36 PM   Modules accepted: Orders

## 2012-04-16 NOTE — Progress Notes (Signed)
  Subjective:    Patient ID: Ricky [name], male    DOB: 1917/06/29, 78 y.o.   MRN: [id number]  HPI He is here after recently being taken care of at [facility] nursing home. He was hospitalized in early April and April 8 transferred to [facility]. He has been there until recently. While in there he was noted to have low iron and started on iron supplementation. Also he was placed on Macrobid for treatment of urinary tract infection. He also is had difficulty with pressure ulcers on his heels. Over the last 4 weeks he is also had difficulty with coughing. He is now being cared for at home and his son and daughter have arranged for caregivers to come in and help with wound care as well as his general needs. Review of his record does indicate that he did have urine culture of enterococcus.  Review of Systems     Objective:   Physical Exam Patient is sitting in the wheelchair very nonresponsive but is able to minimally communicate. Lungs show decreased breath sounds. Cardiac exam shows regular rhythm without murmurs or gallops. Attempts look at the mouth was unsuccessful. Urinalysis microscopic showed the field entirely filled with white blood cells. Pressure sores noted on the tips of both great toes. He does have his left heel covered with a bandage. The right heel does have evidence of a pressure sore.      Assessment & Plan:   1. UTI (lower urinary tract infection)  POCT Urinalysis Dipstick  2. Pressure ulcer of foot, unspecified pressure ulcer stage    3. Cough    4. Anemia     I will place him on Amoxil. They will continue with home care for general purposes and for his wound care. I will get the blood results from [facility]. Whole for the antibiotic will also help with cough. They have discussed a DO NOT RESUSCITATE measures and are all in agreement. Recheck here in 2 weeks.

## 2012-04-19 ENCOUNTER — Other Ambulatory Visit: Payer: Self-pay

## 2012-04-19 ENCOUNTER — Inpatient Hospital Stay: Payer: Medicare Other | Admitting: Family Medicine

## 2012-04-19 DIAGNOSIS — L89619 Pressure ulcer of right heel, unspecified stage: Secondary | ICD-10-CM

## 2012-04-19 DIAGNOSIS — L89629 Pressure ulcer of left heel, unspecified stage: Secondary | ICD-10-CM

## 2012-04-20 ENCOUNTER — Telehealth: Payer: Self-pay | Admitting: Internal Medicine

## 2012-04-20 NOTE — Telephone Encounter (Signed)
Go ahead and call Ricky [name]. Dr. Foster Simpson he did: To Keflex so make sure they take that and get rid of the Amoxil that I gave him

## 2012-04-20 NOTE — Telephone Encounter (Signed)
Mary from [facility] called about getting an appt scheduled at the wound care for evaluation for his wounds on left heel left big toe and right toe. i did not see any documentation when it was scheduled so i called wound care center and he is already scheduled for Friday June 7th at 1:30pm

## 2012-04-20 NOTE — Telephone Encounter (Signed)
Ricky [name] notified that he is now to take keflex and stop amoxil and that it will help with his pneumonia as well

## 2012-04-23 ENCOUNTER — Encounter (HOSPITAL_BASED_OUTPATIENT_CLINIC_OR_DEPARTMENT_OTHER): Payer: Medicare Other | Attending: General Surgery

## 2012-04-23 DIAGNOSIS — G20A1 Parkinson's disease without dyskinesia, without mention of fluctuations: Secondary | ICD-10-CM | POA: Insufficient documentation

## 2012-04-23 DIAGNOSIS — M1990 Unspecified osteoarthritis, unspecified site: Secondary | ICD-10-CM | POA: Insufficient documentation

## 2012-04-23 DIAGNOSIS — M069 Rheumatoid arthritis, unspecified: Secondary | ICD-10-CM | POA: Insufficient documentation

## 2012-04-23 DIAGNOSIS — L89899 Pressure ulcer of other site, unspecified stage: Secondary | ICD-10-CM | POA: Insufficient documentation

## 2012-04-23 DIAGNOSIS — G20 Parkinson's disease: Secondary | ICD-10-CM | POA: Insufficient documentation

## 2012-04-23 DIAGNOSIS — Z79899 Other long term (current) drug therapy: Secondary | ICD-10-CM | POA: Insufficient documentation

## 2012-04-23 DIAGNOSIS — L89609 Pressure ulcer of unspecified heel, unspecified stage: Secondary | ICD-10-CM | POA: Insufficient documentation

## 2012-04-23 DIAGNOSIS — L8990 Pressure ulcer of unspecified site, unspecified stage: Secondary | ICD-10-CM | POA: Insufficient documentation

## 2012-04-23 DIAGNOSIS — I10 Essential (primary) hypertension: Secondary | ICD-10-CM | POA: Insufficient documentation

## 2012-04-23 NOTE — H&P (Signed)
NAMEMarland [name]  Ricky [name] NO.:  [id number]  MEDICAL RECORD NO.:  [id number]  LOCATION:  FOOT                         FACILITY:  [facility]  PHYSICIAN:  Joanne Gavel, M.D.        DATE OF BIRTH:  Oct 18, 1917  DATE OF ADMISSION:  04/23/2012 DATE OF DISCHARGE:                             HISTORY & PHYSICAL   CHIEF COMPLAINT:  Wounds of both feet.  HISTORY OF PRESENT ILLNESS:  This is a 78 year old male, who was ambulatory and relatively up about until approximately 2 months ago, when he had a urinary tract infection, pneumonia, and then fell.  He had hospitalizations, was treated with multiple antibiotics, and had developed several ulcerations.  PAST MEDICAL HISTORY:  Significant for osteoarthritis, rheumatoid arthritis, hypothyroidism, Parkinson disease, hypertension, recent pneumonia, and urinary tract infection.  PAST SURGICAL HISTORY:  He has had cataract surgery.  SOCIAL HISTORY:  Cigarettes and alcohol, none.  MEDICATIONS: 1. Os-Cal. 2. Theragran. 3. Claritin. 4. Fosamax. 5. Synthroid. 6. Hydralazine. 7. Namenda. 8. Keflex. 9. Iron. 10.Aspirin.  ALLERGIES:  None.  REVIEW OF SYSTEMS:  As above.  PHYSICAL EXAMINATION:  VITAL SIGNS: Temperature 98.3, pulse 74, respirations 18, blood pressure 156/80. GENERAL APPEARANCE:  A slender male, lethargic, but does answer questions.  He seems pleasantly demented.  Normal symmetrical strength. CHEST:  Clear. HEART:  Regular rhythm. EXTREMITIES:  Examination of the lower extremities reveals peripheral pulses are not palpable.  ABIs are 0.8.  On the right foot, there is a very small superficial wound of the heel, 0.5 x 0.5 x 0.1, basically it is a scrape.  There is also a tiny wound at the tip of the right great toe.  On the left heel, there is a sizable, 5.4 x 4.9, decubitus-appearing ulcer with a tightly adherent eschar.  At the tip of the left great toe, there is a 0.9 x 0.7, somewhat superficial  wound.  IMPRESSION:  These are the decubitus wounds.  We have discussed with the family offloading,  they are very attentive.  We have discussed the possibility of vascular studies and they agree that this is probably not indicated.  We will start treating the wounds on the left foot, which are more impressive with Santyl and treat the other wounds with off- loading and triple-antibiotic ointment.  We will see him in 7 days.     Joanne Gavel, M.D.     RA/MEDQ  D:  04/23/2012  T:  04/23/2012  Job:  [id number]

## 2012-04-29 ENCOUNTER — Other Ambulatory Visit: Payer: Self-pay | Admitting: Internal Medicine

## 2012-04-29 DIAGNOSIS — M8580 Other specified disorders of bone density and structure, unspecified site: Secondary | ICD-10-CM

## 2012-04-30 ENCOUNTER — Ambulatory Visit (INDEPENDENT_AMBULATORY_CARE_PROVIDER_SITE_OTHER): Payer: Medicare Other | Admitting: Family Medicine

## 2012-04-30 ENCOUNTER — Telehealth: Payer: Self-pay

## 2012-04-30 ENCOUNTER — Encounter: Payer: Self-pay | Admitting: Family Medicine

## 2012-04-30 VITALS — BP 110/70

## 2012-04-30 DIAGNOSIS — L8990 Pressure ulcer of unspecified site, unspecified stage: Secondary | ICD-10-CM

## 2012-04-30 DIAGNOSIS — Z87448 Personal history of other diseases of urinary system: Secondary | ICD-10-CM

## 2012-04-30 LAB — POCT URINALYSIS DIPSTICK
Bilirubin, UA: NEGATIVE
Leukocytes, UA: NEGATIVE
Nitrite, UA: NEGATIVE
Protein, UA: NEGATIVE
Urobilinogen, UA: NEGATIVE
pH, UA: 7

## 2012-04-30 NOTE — Telephone Encounter (Signed)
Called sons phone and talked to lisa let her know that U/A was normal

## 2012-04-30 NOTE — Progress Notes (Signed)
  Subjective:    Patient ID: Ricky [name], male    DOB: November 21, 1916, 78 y.o.   MRN: [id number]  HPI He is here for recheck. Mentally he is doing much better. He has daily visits from home health agencies to help with his decubitus ulcer care and apparently this is getting much better. He has had difficulty recently with nocturia however he does not complaining of any frequency, dysuria. He still does have a slight cough. He recently finished the course of one week of Keflex.   Review of Systems     Objective:   Physical Exam Alert and in no distress. His mental status is improved from his last visit. Exam of his lower extremity shows no edema. His dressings are in place. I chose not to remove them to evaluate the decubiti. Urine dipstick was negative     Assessment & Plan:   1. History of blood in urine  POCT Urinalysis Dipstick  2. Decubitus ulcers     continue with present course of action with the home health agency helping out.

## 2012-05-11 ENCOUNTER — Ambulatory Visit
Admission: RE | Admit: 2012-05-11 | Discharge: 2012-05-11 | Disposition: A | Discharge status: Home / Self Care | Payer: Medicare Other | Source: Ambulatory Visit | Attending: Internal Medicine | Admitting: Internal Medicine

## 2012-05-11 DIAGNOSIS — M8580 Other specified disorders of bone density and structure, unspecified site: Secondary | ICD-10-CM

## 2012-05-21 ENCOUNTER — Encounter (HOSPITAL_BASED_OUTPATIENT_CLINIC_OR_DEPARTMENT_OTHER): Payer: Medicare Other | Attending: General Surgery

## 2012-05-21 DIAGNOSIS — M069 Rheumatoid arthritis, unspecified: Secondary | ICD-10-CM | POA: Insufficient documentation

## 2012-05-21 DIAGNOSIS — L89899 Pressure ulcer of other site, unspecified stage: Secondary | ICD-10-CM | POA: Insufficient documentation

## 2012-05-21 DIAGNOSIS — G20 Parkinson's disease: Secondary | ICD-10-CM | POA: Insufficient documentation

## 2012-05-21 DIAGNOSIS — L8990 Pressure ulcer of unspecified site, unspecified stage: Secondary | ICD-10-CM | POA: Insufficient documentation

## 2012-05-21 DIAGNOSIS — M1990 Unspecified osteoarthritis, unspecified site: Secondary | ICD-10-CM | POA: Insufficient documentation

## 2012-05-21 DIAGNOSIS — I10 Essential (primary) hypertension: Secondary | ICD-10-CM | POA: Insufficient documentation

## 2012-05-21 DIAGNOSIS — L89609 Pressure ulcer of unspecified heel, unspecified stage: Secondary | ICD-10-CM | POA: Insufficient documentation

## 2012-05-21 DIAGNOSIS — Z79899 Other long term (current) drug therapy: Secondary | ICD-10-CM | POA: Insufficient documentation

## 2012-05-21 DIAGNOSIS — G20A1 Parkinson's disease without dyskinesia, without mention of fluctuations: Secondary | ICD-10-CM | POA: Insufficient documentation

## 2012-06-18 ENCOUNTER — Encounter (HOSPITAL_BASED_OUTPATIENT_CLINIC_OR_DEPARTMENT_OTHER): Payer: Medicare Other | Attending: General Surgery

## 2012-06-18 DIAGNOSIS — L8992 Pressure ulcer of unspecified site, stage 2: Secondary | ICD-10-CM | POA: Insufficient documentation

## 2012-06-18 DIAGNOSIS — L89609 Pressure ulcer of unspecified heel, unspecified stage: Secondary | ICD-10-CM | POA: Insufficient documentation

## 2012-06-18 DIAGNOSIS — Z8673 Personal history of transient ischemic attack (TIA), and cerebral infarction without residual deficits: Secondary | ICD-10-CM | POA: Insufficient documentation

## 2012-06-20 ENCOUNTER — Encounter (HOSPITAL_COMMUNITY): Payer: Self-pay | Admitting: *Deleted

## 2012-06-20 ENCOUNTER — Emergency Department (HOSPITAL_COMMUNITY): Payer: Medicare Other

## 2012-06-20 ENCOUNTER — Inpatient Hospital Stay (HOSPITAL_COMMUNITY)
Admission: EM | Admit: 2012-06-20 | Discharge: 2012-06-24 | DRG: 064 | Disposition: A | Discharge status: Home Health | Payer: Medicare Other | Attending: Internal Medicine | Admitting: Internal Medicine

## 2012-06-20 DIAGNOSIS — I498 Other specified cardiac arrhythmias: Secondary | ICD-10-CM

## 2012-06-20 DIAGNOSIS — E039 Hypothyroidism, unspecified: Secondary | ICD-10-CM

## 2012-06-20 DIAGNOSIS — K589 Irritable bowel syndrome without diarrhea: Secondary | ICD-10-CM | POA: Diagnosis present

## 2012-06-20 DIAGNOSIS — F0280 Dementia in other diseases classified elsewhere without behavioral disturbance: Secondary | ICD-10-CM

## 2012-06-20 DIAGNOSIS — M6281 Muscle weakness (generalized): Secondary | ICD-10-CM

## 2012-06-20 DIAGNOSIS — R55 Syncope and collapse: Secondary | ICD-10-CM

## 2012-06-20 DIAGNOSIS — I639 Cerebral infarction, unspecified: Secondary | ICD-10-CM | POA: Diagnosis present

## 2012-06-20 DIAGNOSIS — R1310 Dysphagia, unspecified: Secondary | ICD-10-CM

## 2012-06-20 DIAGNOSIS — G309 Alzheimer's disease, unspecified: Secondary | ICD-10-CM | POA: Diagnosis present

## 2012-06-20 DIAGNOSIS — M810 Age-related osteoporosis without current pathological fracture: Secondary | ICD-10-CM | POA: Diagnosis present

## 2012-06-20 DIAGNOSIS — Z86718 Personal history of other venous thrombosis and embolism: Secondary | ICD-10-CM

## 2012-06-20 DIAGNOSIS — R531 Weakness: Secondary | ICD-10-CM | POA: Diagnosis present

## 2012-06-20 DIAGNOSIS — F0390 Unspecified dementia without behavioral disturbance: Secondary | ICD-10-CM | POA: Diagnosis present

## 2012-06-20 DIAGNOSIS — I6789 Other cerebrovascular disease: Secondary | ICD-10-CM

## 2012-06-20 DIAGNOSIS — K623 Rectal prolapse: Secondary | ICD-10-CM

## 2012-06-20 DIAGNOSIS — R339 Retention of urine, unspecified: Secondary | ICD-10-CM | POA: Diagnosis not present

## 2012-06-20 DIAGNOSIS — K3184 Gastroparesis: Secondary | ICD-10-CM

## 2012-06-20 DIAGNOSIS — H3530 Unspecified macular degeneration: Secondary | ICD-10-CM | POA: Diagnosis present

## 2012-06-20 DIAGNOSIS — L89899 Pressure ulcer of other site, unspecified stage: Secondary | ICD-10-CM | POA: Diagnosis present

## 2012-06-20 DIAGNOSIS — I10 Essential (primary) hypertension: Secondary | ICD-10-CM

## 2012-06-20 DIAGNOSIS — N318 Other neuromuscular dysfunction of bladder: Secondary | ICD-10-CM | POA: Diagnosis present

## 2012-06-20 DIAGNOSIS — G20A1 Parkinson's disease without dyskinesia, without mention of fluctuations: Secondary | ICD-10-CM | POA: Diagnosis present

## 2012-06-20 DIAGNOSIS — Z8669 Personal history of other diseases of the nervous system and sense organs: Secondary | ICD-10-CM

## 2012-06-20 DIAGNOSIS — R29898 Other symptoms and signs involving the musculoskeletal system: Secondary | ICD-10-CM | POA: Diagnosis present

## 2012-06-20 DIAGNOSIS — H269 Unspecified cataract: Secondary | ICD-10-CM | POA: Diagnosis present

## 2012-06-20 DIAGNOSIS — G20 Parkinson's disease: Secondary | ICD-10-CM | POA: Diagnosis present

## 2012-06-20 DIAGNOSIS — M109 Gout, unspecified: Secondary | ICD-10-CM

## 2012-06-20 DIAGNOSIS — M129 Arthropathy, unspecified: Secondary | ICD-10-CM | POA: Diagnosis present

## 2012-06-20 DIAGNOSIS — L8994 Pressure ulcer of unspecified site, stage 4: Secondary | ICD-10-CM | POA: Diagnosis present

## 2012-06-20 DIAGNOSIS — J189 Pneumonia, unspecified organism: Secondary | ICD-10-CM

## 2012-06-20 DIAGNOSIS — I6350 Cerebral infarction due to unspecified occlusion or stenosis of unspecified cerebral artery: Principal | ICD-10-CM | POA: Diagnosis present

## 2012-06-20 DIAGNOSIS — N390 Urinary tract infection, site not specified: Secondary | ICD-10-CM

## 2012-06-20 HISTORY — DX: Parkinson's disease: G20

## 2012-06-20 HISTORY — DX: Dementia in other diseases classified elsewhere, unspecified severity, without behavioral disturbance, psychotic disturbance, mood disturbance, and anxiety: F02.80

## 2012-06-20 LAB — DIFFERENTIAL
Basophils Absolute: 0 10*3/uL (ref 0.0–0.1)
Basophils Relative: 0 % (ref 0–1)
Eosinophils Relative: 1 % (ref 0–5)
Monocytes Absolute: 0.4 10*3/uL (ref 0.1–1.0)

## 2012-06-20 LAB — URINALYSIS, ROUTINE W REFLEX MICROSCOPIC
Hgb urine dipstick: NEGATIVE
Ketones, ur: NEGATIVE mg/dL
Protein, ur: NEGATIVE mg/dL
Urobilinogen, UA: 0.2 mg/dL (ref 0.0–1.0)

## 2012-06-20 LAB — COMPREHENSIVE METABOLIC PANEL
AST: 22 U/L (ref 0–37)
Albumin: 3.4 g/dL — ABNORMAL LOW (ref 3.5–5.2)
BUN: 16 mg/dL (ref 6–23)
Calcium: 9.6 mg/dL (ref 8.4–10.5)
Creatinine, Ser: 1.01 mg/dL (ref 0.50–1.35)
GFR calc non Af Amer: 62 mL/min — ABNORMAL LOW (ref >90)

## 2012-06-20 LAB — CK TOTAL AND CKMB (NOT AT ARMC)
CK, MB: 2.5 ng/mL (ref 0.3–4.0)
Relative Index: INVALID (ref 0.0–2.5)
Total CK: 60 U/L (ref 7–232)

## 2012-06-20 LAB — CBC
HCT: 33 % — ABNORMAL LOW (ref 39.0–52.0)
MCH: 27.2 pg (ref 26.0–34.0)
MCHC: 32.1 g/dL (ref 30.0–36.0)
MCV: 84.8 fL (ref 78.0–100.0)
RDW: 15.1 % (ref 11.5–15.5)

## 2012-06-20 LAB — APTT: aPTT: 30 seconds (ref 24–37)

## 2012-06-20 MED ORDER — ASPIRIN EC 81 MG PO TBEC
81.0000 mg | DELAYED_RELEASE_TABLET | Freq: Every day | ORAL | Status: DC
  Administered 2012-06-21 – 2012-06-22 (×2): 81 mg via ORAL
  Filled 2012-06-20 (×3): qty 1

## 2012-06-20 MED ORDER — HYDRALAZINE HCL 25 MG PO TABS
25.0000 mg | ORAL_TABLET | Freq: Three times a day (TID) | ORAL | Status: DC
  Administered 2012-06-21 – 2012-06-24 (×12): 25 mg via ORAL
  Filled 2012-06-20 (×16): qty 1

## 2012-06-20 MED ORDER — PIPERACILLIN-TAZOBACTAM 3.375 G IVPB
3.3750 g | Freq: Once | INTRAVENOUS | Status: AC
  Administered 2012-06-21: 3.375 g via INTRAVENOUS
  Filled 2012-06-20: qty 50

## 2012-06-20 MED ORDER — VITAMIN B-12 1000 MCG PO TABS
5000.0000 ug | ORAL_TABLET | Freq: Every day | ORAL | Status: DC
  Administered 2012-06-22 – 2012-06-24 (×3): 5000 ug via ORAL
  Filled 2012-06-20 (×5): qty 5

## 2012-06-20 MED ORDER — LEVOTHYROXINE SODIUM 25 MCG PO TABS
25.0000 ug | ORAL_TABLET | Freq: Every day | ORAL | Status: DC
  Administered 2012-06-21 – 2012-06-24 (×4): 25 ug via ORAL
  Filled 2012-06-20 (×7): qty 1

## 2012-06-20 MED ORDER — MEMANTINE HCL 10 MG PO TABS
10.0000 mg | ORAL_TABLET | Freq: Every evening | ORAL | Status: DC
  Administered 2012-06-21 – 2012-06-23 (×4): 10 mg via ORAL
  Filled 2012-06-20 (×6): qty 1

## 2012-06-20 MED ORDER — ACETAMINOPHEN 500 MG PO TABS
500.0000 mg | ORAL_TABLET | Freq: Two times a day (BID) | ORAL | Status: DC
  Administered 2012-06-21 – 2012-06-24 (×8): 500 mg via ORAL
  Filled 2012-06-20 (×10): qty 1

## 2012-06-20 MED ORDER — VANCOMYCIN HCL 1000 MG IV SOLR
1250.0000 mg | Freq: Once | INTRAVENOUS | Status: AC
  Administered 2012-06-20: 1250 mg via INTRAVENOUS
  Filled 2012-06-20: qty 1250

## 2012-06-20 MED ORDER — LORATADINE 10 MG PO TABS
10.0000 mg | ORAL_TABLET | Freq: Every day | ORAL | Status: DC
  Administered 2012-06-22 – 2012-06-24 (×3): 10 mg via ORAL
  Filled 2012-06-20 (×5): qty 1

## 2012-06-20 NOTE — Consult Note (Signed)
Code Stroke Consulting Physician: ED Reason for consult; Code stroke  Charts,medications, labs and images reviewed History obtained from son who is a care giver      Chief Complaint: Left Sided weakness HPI:[name] is a 78 y/o RHAAM with multiple medical issues in addition to Neurodegenerative disorder and progressive dementia. According to the son, his father has stopped ambulating since April. He has been diagnosed with parkinson plus syndrome. And also has been having audio and visual hallucinations. He has significant weakness in all the extremities but according to the son he had more weakness today on the left side compared to the right and the symptoms started sometimes around 2 pm He was outside the TPA window  TPA was not given: 1) Outside the TPA window 2) Difficult to say if his unresponsiveness and weakness are secondary to Neurodegenerative disorder and or deconditioning 3) less Likely to be stroke  4) Risk >benefit   Patient seen at the bedside, He was recently discharged from nursing home for stage 4 decubitus ulcer on the left foot, that was positive for MRSA.  He has completed the full course of ABX.   Patient follows minimal commands.  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 IBS (irritable bowel syndrome)   . Diverticulosis   . Cataract   . OAB (overactive bladder)   . Macular degeneration   . Alzheimer disease   . Hypertension   . Arthritis   . Osteoporosis   . Parkinsonian syndrome   . Syncope, vasovagal   . Neuromuscular disorder     parkisonian syndrome       Narcolepsy Decubitus ulcers  Past Surgical History    Procedure Date  . Esophagogastroduodenoscopy   . Eye surgery     LEFT CATARACT    Family History  Problem Relation Age of Onset  . Cancer Mother   . Stroke Father   . Liver disease Father   . Kidney disease Brother    Social History:  reports that he has never smoked. He has never used smokeless tobacco. He reports that he does not drink alcohol or use illicit drugs.  Allergies: No Known Allergies   ROS: Limited. Patient denies chest pain, headaches, nausea, vomiting,  C/o of left leg pain,  Speech is back to baseline   Physical Examination: Blood pressure 159/94, temperature 98.2 F (36.8 C), temperature source Oral, resp. rate 20, SpO2 96.00%.  General Examination:   Awake, alert and has some pill rolling on the left as well as right hand.masked facies, stooped posture, kyphosis HEENT-  Normocephalic, no lesions, without obvious abnormality.  Normal external eye and conjunctiva.  Normal TM's bilaterally.  Significant hearing deficits, Upward gaze paresis, Significant vision compromised Neck supple with no masses, nodes, nodules or enlargement. Cardiovascular -Bradycardia - S1, S2 normal, systolic ejectionmurmur, no gallop,  Lungs: CTA Abdomen -Bowel sounds present, non tender Extremities -Left leg with decubitus  And right leg loss of arch, edema in all the 4 extremities LE>UE Skin; ulcer   Neurologic Examination: Mental Status: Alert,poor thought content, falling asleep during examination, representing some visual hallucinations, memory poor and has delayed responses  Speech  fluent without evidence of aphasia.   Able to follow 2 step commands without difficulty.  Cranial Nerves: II: visual fields  Poor because of  Macular degeneration, upward gaze palsy  normal, pupils equal, round, reactive to light and accommodation  III,IV, VI: ptosis not present,Upward gaze palsy, EOM,  compromised V,VII: Unable to smile , facial light touch sensation normal bilaterally VIII:  hearing compromised patient unable to participate in Rinne and weber testing IX,X: gag reflex present XI: trapezius strength/neck flexion strength weak XII: tongue strength normal   Motor: Significant weakness in all the extremities.   Difficult to separate old and new weakness  Tone is increased and has significant hand muscle weakness  Rigidity And pill rolling obvious intermittently, gets worse with distraction Reflexes  Patellar : 0 UE: 1 Right Ankle: 0  Sensory: Pinprick and light touch intact throughout, bilaterally  Plantars: Right: mute   Left: unable to  Do because of the ulcer Cerebellar: unable to cooperate Gait: Patient does not ambulate   Myerson sign : Positive Glabellar sign positive Policomental and palmomental signs positive    Laboratory Studies: Basic Metabolic Panel: No results found for this basename: NA:5,K:5,CL:5,CO2:5,GLUCOSE:5,BUN:5,CREATININE:5,CALCIUM:3,MG:5,PHOS:5 in the last 168 hours  Liver Function Tests: No results found for this basename: AST:5,ALT:5,ALKPHOS:5,BILITOT:5,PROT:5,ALBUMIN:5 in the last 168 hours No results found for this basename: LIPASE:5,AMYLASE:5 in the last 168 hours No results found for this basename: AMMONIA:3 in the last 168 hours  CBC: No results found for this basename: WBC:5,NEUTROABS:5,HGB:5,HCT:5,MCV:5,PLT:5 in the last 168 hours  Cardiac Enzymes: No results found for this basename: CKTOTAL:5,CKMB:5,CKMBINDEX:5,TROPONINI:5 in the last 168 hours  BNP: No components found with this basename: POCBNP:5  CBG: No results found for this basename: GLUCAP:5 in the last 168 hours  Microbiology: Results for orders placed during the hospital encounter of 02/17/12  URINE CULTURE     Status: Normal   Collection Time   02/17/12 11:05 AM      Component Value Range Status Comment   Specimen Description URINE, CATHETERIZED   Final    Special Requests NONE   Final    Culture  Setup Time [id number]   Final    Colony  Count >=100,000 COLONIES/ML   Final    Culture ENTEROCOCCUS SPECIES   Final    Report Status 02/22/2012 FINAL   Final    Organism ID, Bacteria ENTEROCOCCUS SPECIES   Final   CULTURE, BLOOD (ROUTINE X 2)     Status: Normal   Collection Time   02/17/12 11:48 AM      Component Value Range Status Comment   Specimen Description BLOOD RIGHT HAND   Final    Special Requests BOTTLES DRAWN AEROBIC ONLY 1CC   Final    Culture  Setup Time [id number]   Final    Culture NO GROWTH 5 DAYS   Final    Report Status 02/23/2012 FINAL   Final   CULTURE, BLOOD (ROUTINE X 2)     Status: Normal   Collection Time   02/17/12 12:00 PM      Component Value Range Status Comment   Specimen Description BLOOD LEFT WRIST   Final    Special Requests     Final    Value: BOTTLES DRAWN AEROBIC AND ANAEROBIC BLUE 10CC, RED 5CC   Culture  Setup Time [id number]   Final    Culture NO GROWTH 5 DAYS   Final    Report Status 02/23/2012 FINAL   Final   MRSA PCR SCREENING  Status: Normal   Collection Time   02/17/12  7:31 PM      Component Value Range Status Comment   MRSA by PCR NEGATIVE  NEGATIVE Final     Coagulation Studies: No results found for this basename: LABPROT:5,INR:5 in the last 72 hours  Urinalysis: No results found for this basename: COLORURINE:2,APPERANCEUR:2,LABSPEC:2,PHURINE:2,GLUCOSEU:2,HGBUR:2,BILIRUBINUR:2,KETONESUR:2,PROTEINUR:2,UROBILINOGEN:2,NITRITE:2,LEUKOCYTESUR:2 in the last 168 hours  Lipid Panel:    Component Value Date/Time   CHOL  Value: 210        ATP III CLASSIFICATION:  <200     mg/dL   Desirable  161-096  mg/dL   Borderline High  >=045    mg/dL   High       * 4/0/9811 0426   TRIG 43 12/18/2009 0426   HDL 67 12/18/2009 0426   CHOLHDL 3.1 12/18/2009 0426   VLDL 9 12/18/2009 0426   LDLCALC  Value: 134        Total Cholesterol/HDL:CHD Risk Coronary Heart Disease Risk Table                     Men   Women  1/2 Average Risk   3.4   3.3  Average Risk       5.0   4.4  2 X Average Risk   9.6   7.1   3 X Average Risk  23.4   11.0        Use the calculated Patient Ratio above and the CHD Risk Table to determine the patient's CHD Risk.        ATP III CLASSIFICATION (LDL):  <100     mg/dL   Optimal  914-782  mg/dL   Near or Above                    Optimal  130-159  mg/dL   Borderline  956-213  mg/dL   High  >086     mg/dL   Very High* 03/23/8468 6295    HgbA1C: No results found for this basename: HGBA1C    Urine Drug Screen:   No results found for this basename: labopia, cocainscrnur, labbenz, amphetmu, thcu, labbarb     Alcohol Level: No results found for this basename: ETH:2 in the last 168 hours  Other results: EKG: normal EKG, normal sinus rhythm, unchanged from previous tracings.  Imaging: Ct Head Wo Contrast  06/20/2012  *RADIOLOGY REPORT*  Clinical Data: .  Confusion.  Weakness.  CT HEAD WITHOUT CONTRAST  Technique:  Contiguous axial images were obtained from the base of the skull through the vertex without contrast.  Comparison: None.  Findings: The patient is markedly kyphotic and the patient was unable to be scanned in the normal supine position. Atrophy and chronic ischemic white matter disease is present.  There is no gross acute intracranial abnormality identified.  Atherosclerosis is present.  Right sphenoid sinus disease present.  Mastoid air cells clear.  IMPRESSION: No gross acute intracranial abnormality.  Atrophy and chronic ischemic white matter disease.  Original Report Authenticated By: Andreas Newport, M.D.     Assessment and Recommendations: Patient with worsening of parkinson plus syndrome, patient has significant orthostatic  Which might be secondary to Multiple system atrophy, Upward gaze palsy can be attributed to Supranuclear palsy,  Has worsening of Visual hallucinations which could be attributed to  Lewy body, he was started on Sinemet but his symptoms got worse and was taken off by his neurologist.  In either case, these are progressive neurodegenerative  disorders  and has progressive instability of gait. Patient comes with left sided weakness, in my exam it is difficult to differentiate new vs old weakness.   Patient is extremely kyphotic and unable to fit in the MRI  Not sure if this is really an ischemic stroke  Recommendations: 1) Repeat head CT tomorrow. If it indicates stroke. We can do an echo and carotid dopplers 2) Patient is already on ASA   Thank you for the consult   Marketia Stallsmith V-P Eilleen Kempf., MD., Ph.D.,MS 05/12/2012, 12:54 PM  06/20/2012, 8:55 PM

## 2012-06-20 NOTE — ED Provider Notes (Signed)
History     CSN: [id number]  Arrival date & time 06/20/12  2002   First MD Initiated Contact with Patient 06/20/12 2005      No chief complaint on file.   (Consider location/radiation/quality/duration/timing/severity/associated sxs/prior treatment) Patient is a 78 y.o. male presenting with neurologic complaint. The history is provided by the EMS personnel and a relative (son).  Neurologic Problem The primary symptoms include altered mental status and focal weakness (left sided weakness). Primary symptoms do not include syncope, loss of consciousness, loss of sensation, speech change, fever, nausea or vomiting. The symptoms began 6 to 12 hours ago. The symptoms are unchanged.  The change in mental status began today. The altered mental status developed gradually. The change in mental status has been unchanged since its onset. The change in mental status includes confusion and a gait disturbance.  Weakness began 6 - 12 hours ago. The weakness is unchanged. There is near normal muscle function with maximum physical effort.  There is impairment of the following actions: walking.  Additional symptoms include weakness (left sided).    Past Medical History  Diagnosis Date  . IBS (irritable bowel syndrome)   . Diverticulosis   . Cataract   . OAB (overactive bladder)   . Macular degeneration   . Alzheimer disease   . Hypertension   . Arthritis   . Osteoporosis   . Parkinsonian syndrome   . Syncope, vasovagal   . Neuromuscular disorder     parkisonian syndrome    Past Surgical History  Procedure Date  . Esophagogastroduodenoscopy   . Eye surgery     LEFT CATARACT    Family History  Problem Relation Age of Onset  . Cancer Mother   . Stroke Father   . Liver disease Father   . Kidney disease Brother     History  Substance Use Topics  . Smoking status: Never Smoker   . Smokeless tobacco: Never Used  . Alcohol Use: No      Review of Systems  Unable to perform ROS:  Dementia  Constitutional: Negative for fever.  HENT: Negative.   Eyes: Negative.   Respiratory: Negative.   Cardiovascular: Negative.  Negative for syncope.  Gastrointestinal: Negative for nausea and vomiting.  Neurological: Positive for focal weakness (left sided weakness) and weakness (left sided). Negative for speech change and loss of consciousness.  Psychiatric/Behavioral: Positive for confusion and altered mental status.    Allergies  Review of patient's allergies indicates no known allergies.  Home Medications   Current Outpatient Rx  Name Route Sig Dispense Refill  . ACETAMINOPHEN 500 MG PO TABS Oral Take 500 mg by mouth 2 (two) times daily.    . ALENDRONATE SODIUM 70 MG PO TABS Oral Take 70 mg by mouth every 7 (seven) days. Takes on Thursdays. Take with a full glass of water on an empty stomach. Hold while in hospital    . ASPIRIN EC 81 MG PO TBEC Oral Take 1 tablet (81 mg total) by mouth daily. 30 tablet 11  . CALCIUM CARBONATE-VITAMIN D 500-200 MG-UNIT PO TABS Oral Take 1 tablet by mouth 2 (two) times daily.    Marland Kitchen FERROUS FUMARATE 325 (106 FE) MG PO TABS Oral Take 1 tablet by mouth.    Marland Kitchen HYDRALAZINE HCL 25 MG PO TABS Oral Take 1 tablet (25 mg total) by mouth every 8 (eight) hours. 90 tablet 0  . LEVOTHYROXINE SODIUM 25 MCG PO TABS Oral Take 25 mcg by mouth daily.    Marland Kitchen  LORATADINE 10 MG PO TABS Oral Take 10 mg by mouth daily.    Marland Kitchen MEMANTINE HCL 10 MG PO TABS Oral Take 10 mg by mouth every evening.     . ICAPS AREDS FORMULA PO Oral Take 2 capsules by mouth 2 (two) times daily.     . MULTI-VITAMIN/MINERALS PO TABS Oral Take 1 tablet by mouth daily.        There were no vitals taken for this visit.  Physical Exam  Nursing note and vitals reviewed. Constitutional: He is oriented to person, place, and time. He appears well-developed and well-nourished. No distress.  HENT:  Head: Normocephalic and atraumatic.  Eyes: Conjunctivae are normal.  Neck: Neck supple.    Cardiovascular: Normal rate, regular rhythm, normal heart sounds and intact distal pulses.   Pulmonary/Chest: Effort normal and breath sounds normal. He has no wheezes. He has no rales.  Abdominal: Soft. He exhibits no distension. There is no tenderness.  Musculoskeletal: Normal range of motion.  Neurological: He is alert and oriented to person, place, and time. No cranial nerve deficit or sensory deficit. He exhibits abnormal muscle tone (chronic, b/l).  Skin: Skin is warm and dry.    ED Course  Procedures (including critical care time)  Labs Reviewed - No data to display No results found.   1. Healthcare-associated pneumonia   2. Alzheimer's disease   3. Community acquired pneumonia   4. Hypertension   5. Left-sided weakness       MDM  78 yo male with history of Parkinsonia syndrome, IBS, HTN, presents as a Code Stroke for left sided weakness.  Per pt's son he has had several days of worsening constipation and 2 days of decreased urine output.  Last BM today.  Pt also has pressure on the right foot for which he has completed a course of Keflex.  Today he was noted to be leaning more to the left and having difficulty walking prompting son to call EMS.  Pt also sleeping more today and increasingly confused.  AF, VSS, NAD at presentation.  Neuro exam with b/l symmetric weakness.  No signs of focal deficit.  Pt taken straight to CT scan.  CT head with NAICA.  Neurology evaluated pt and agreed presentation was not consistent with CVA.  Sx started 6 hrs prior to arrival and pt out of TPA window.  In discussion with son, increasing confusion was concerning for possible infectious cause.  CXR concerning for pneumonia.  Pt has had recent hospitalization and will cover for HCAP with Vanc and Zosyn.  Will admit pt to hospitalist service for further management and repeat CT head tomorrow.        Cherre Robins, MD 06/21/12 [phone]

## 2012-06-20 NOTE — ED Notes (Signed)
Vancomycin hung 1250mg 

## 2012-06-20 NOTE — ED Notes (Signed)
The pt is alert the pt converses well  With good eye contact.  Head adjusted.  Son at the bedside.  The code stroke has reportedly been cancelled.

## 2012-06-20 NOTE — ED Notes (Signed)
The pt is resting at present.  Ricky [name] still infusing

## 2012-06-20 NOTE — H&P (Signed)
Ricky [name] is an 78 y.o. male.   Patient was seen and examined on June 20, 2012 at 10:55 PM. PCP - Dr. Sharlot Gowda. Chief Complaint: Left-sided weakness. HPI: Most of the history obtained from ER physician, nurse, consult notes from neurologists and previous records as unable to reach family and patient has dementia. 78 year old male with history of Parkinson's disease, dementia who was admitted in April 2013 for vasovagal syncope and UTI was brought to the ER when patient's son noticed patient was having increased confusion and left-sided weakness. Patient was brought as a code stroke. Since patient was outside the TPA window period the code was canceled. Neurologist evaluated the patient. CT head was done which was negative for anything acute. Chest x-ray showed possible infiltrates. Patient has been admitted for further management of his possible pneumonia. As per neurologist at this time we don't feel it's CVA but advised to repeat CT head in the 24 hours and if it shows any features to have further workup for stroke. Patient denies any chest pain, shortness of breath, nausea vomiting or abdominal pain or diarrhea.   Past Medical History  Diagnosis Date  . IBS (irritable bowel syndrome)   . Diverticulosis   . Cataract   . OAB (overactive bladder)   . Macular degeneration   . Alzheimer disease   . Hypertension   . Arthritis   . Osteoporosis   . Parkinsonian syndrome   . Syncope, vasovagal   . Neuromuscular disorder     parkisonian syndrome    Past Surgical History  Procedure Date  . Esophagogastroduodenoscopy   . Eye surgery     LEFT CATARACT    Family History  Problem Relation Age of Onset  . Cancer Mother   . Stroke Father   . Liver disease Father   . Kidney disease Brother    Social History:  reports that he has never smoked. He has never used smokeless tobacco. He reports that he does not drink alcohol or use illicit drugs.  Allergies: No Known  Allergies   (Not in a hospital admission)  Results for orders placed during the hospital encounter of 06/20/12 (from the past 48 hour(s))  PROTIME-INR     Status: Abnormal   Collection Time   06/20/12  8:50 PM      Component Value Range Comment   Prothrombin Time 15.5 (*) 11.6 - 15.2 seconds    INR 1.20  0.00 - 1.49   APTT     Status: Normal   Collection Time   06/20/12  8:50 PM      Component Value Range Comment   aPTT 30  24 - 37 seconds   CBC     Status: Abnormal   Collection Time   06/20/12  8:50 PM      Component Value Range Comment   WBC 5.6  4.0 - 10.5 K/uL    RBC 3.89 (*) 4.22 - 5.81 MIL/uL    Hemoglobin 10.6 (*) 13.0 - 17.0 g/dL    HCT 16.1 (*) 09.6 - 52.0 %    MCV 84.8  78.0 - 100.0 fL    MCH 27.2  26.0 - 34.0 pg    MCHC 32.1  30.0 - 36.0 g/dL    RDW 04.5  40.9 - 81.1 %    Platelets 149 (*) 150 - 400 K/uL   DIFFERENTIAL     Status: Normal   Collection Time   06/20/12  8:50 PM      Component  Value Range Comment   Neutrophils Relative 61  43 - 77 %    Neutro Abs 3.4  1.7 - 7.7 K/uL    Lymphocytes Relative 30  12 - 46 %    Lymphs Abs 1.7  0.7 - 4.0 K/uL    Monocytes Relative 8  3 - 12 %    Monocytes Absolute 0.4  0.1 - 1.0 K/uL    Eosinophils Relative 1  0 - 5 %    Eosinophils Absolute 0.1  0.0 - 0.7 K/uL    Basophils Relative 0  0 - 1 %    Basophils Absolute 0.0  0.0 - 0.1 K/uL   COMPREHENSIVE METABOLIC PANEL     Status: Abnormal   Collection Time   06/20/12  8:50 PM      Component Value Range Comment   Sodium 136  135 - 145 mEq/L    Potassium 3.8  3.5 - 5.1 mEq/L    Chloride 100  96 - 112 mEq/L    CO2 23  19 - 32 mEq/L    Glucose, Bld 117 (*) 70 - 99 mg/dL    BUN 16  6 - 23 mg/dL    Creatinine, Ser 1.61  0.50 - 1.35 mg/dL    Calcium 9.6  8.4 - 09.6 mg/dL    Total Protein 7.8  6.0 - 8.3 g/dL    Albumin 3.4 (*) 3.5 - 5.2 g/dL    AST 22  0 - 37 U/L    ALT 10  0 - 53 U/L    Alkaline Phosphatase 74  39 - 117 U/L    Total Bilirubin 0.3  0.3 - 1.2 mg/dL    GFR  calc non Af Amer 62 (*) >90 mL/min    GFR calc Af Amer 72 (*) >90 mL/min   CK TOTAL AND CKMB     Status: Normal   Collection Time   06/20/12  8:50 PM      Component Value Range Comment   Total CK 60  7 - 232 U/L    CK, MB 2.5  0.3 - 4.0 ng/mL    Relative Index RELATIVE INDEX IS INVALID  0.0 - 2.5   TROPONIN I     Status: Normal   Collection Time   06/20/12  8:50 PM      Component Value Range Comment   Troponin I <0.30  <0.30 ng/mL    Ct Head Wo Contrast  06/20/2012  *RADIOLOGY REPORT*  Clinical Data: .  Confusion.  Weakness.  CT HEAD WITHOUT CONTRAST  Technique:  Contiguous axial images were obtained from the base of the skull through the vertex without contrast.  Comparison: None.  Findings: The patient is markedly kyphotic and the patient was unable to be scanned in the normal supine position. Atrophy and chronic ischemic white matter disease is present.  There is no gross acute intracranial abnormality identified.  Atherosclerosis is present.  Right sphenoid sinus disease present.  Mastoid air cells clear.  IMPRESSION: No gross acute intracranial abnormality.  Atrophy and chronic ischemic white matter disease.  Original Report Authenticated By: Andreas Newport, M.D.   Dg Chest Port 1 View  06/20/2012  *RADIOLOGY REPORT*  Clinical Data: Code stroke.  PORTABLE CHEST - 1 VIEW  Comparison: 02/22/2012  Findings: Shallow inspiration.  Cardiac enlargement with normal pulmonary vascularity for technique.  Probable infiltration or atelectasis in both lung bases.  No pneumothorax.  No blunting of costophrenic angles.  Calcified granuloma in the right midlung. Calcification  and torsion of the aorta.  Degenerative changes in the spine and shoulders.  No significant change since previous study.  IMPRESSION: Cardiac enlargement with probable infiltration or atelectasis in the lung bases.  Original Report Authenticated By: Marlon Pel, M.D.    Review of Systems  Constitutional: Negative.   HENT:  Negative.   Eyes: Negative.   Respiratory: Negative.   Cardiovascular: Negative.   Gastrointestinal: Negative.   Genitourinary: Negative.   Musculoskeletal: Negative.   Skin: Negative.   Neurological:       Left sided weakness.  Endo/Heme/Allergies: Negative.     Blood pressure 159/94, temperature 99.4 F (37.4 C), temperature source Rectal, resp. rate 20, SpO2 96.00%. Physical Exam  Constitutional: He appears well-developed and well-nourished. No distress.  HENT:  Head: Normocephalic and atraumatic.  Right Ear: External ear normal.  Left Ear: External ear normal.  Nose: Nose normal.  Mouth/Throat: Oropharynx is clear and moist. No oropharyngeal exudate.  Eyes: Conjunctivae are normal. Pupils are equal, round, and reactive to light. Right eye exhibits no discharge. Left eye exhibits no discharge. No scleral icterus.  Neck: Normal range of motion. Neck supple.  Cardiovascular: Normal rate and regular rhythm.   Respiratory: Effort normal and breath sounds normal. No respiratory distress. He has no wheezes. He has no rales.  GI: Soft. Bowel sounds are normal. He exhibits no distension. There is no tenderness. There is no rebound.  Musculoskeletal:       Left foot under dressing.  Neurological: He is alert.       Oriented to name and place. Follows commands. Mild weakness in left upper and lower extremities.  Skin: Skin is warm. He is not diaphoretic.     Assessment/Plan #1. Pneumonia - it has been almost 4 months since patient was discharged so we will treat this as community acquired pneumonia with ceftriaxone and Zithromax. Follow patient clinically and change to by mouth antibiotics. #2. Left-sided weakness - patient is on neurochecks. Check repeat CT head adequate shows any features of a new stroke then further workup for stroke as advised by neurologist. #3. History of Left foot stage IV decubitus ulcer -  I have requested wound team consult. Patient did receive vancomycin  initially ER. Further recommendations per the wound team. #4. History of Parkinson's and dementia - continue present medications. #5. Hypertension - continue present medications.  CODE STATUS - full code.   Eduard Clos. 06/20/2012, 10:55 PM

## 2012-06-20 NOTE — ED Notes (Signed)
Waiting for bed assignment  

## 2012-06-20 NOTE — ED Notes (Signed)
The pts son has left .  Pt remains alert

## 2012-06-20 NOTE — Code Documentation (Signed)
78 yo bm brought in via [facility] for onset of increasing Lt side weakness with LSN 1400.  Code stroke encoded 59, code stroke called 1950, pt arrival time 2002, EDP exam 2002, stroke team arrival 2000, LSN 1400, pt arrival in CT 2005, phlebotomist arrival 2025.  Pt with recent hx of decreased UOP & no BM x 3 days.  Recent d/c from SNF in late May with Lt heel decub, MRSA.  Not a tPA candidate

## 2012-06-20 NOTE — ED Notes (Signed)
Per EMS - pt from home, lives w/ his son, recently d/c'd from nursing facility. Pt reported to have left-sided weakness that began approx 1400, progressively worse - also increased weakness w/ ambulation, unequal smile for EMS. Pt w/ staph infection to left heal - pt also w/ hx of "parkinson's like tremors" and dementia. Pt denies any c/o at present. Pt alert to person and place, disoriented to date.

## 2012-06-21 ENCOUNTER — Inpatient Hospital Stay (HOSPITAL_COMMUNITY): Payer: Medicare Other

## 2012-06-21 ENCOUNTER — Encounter (HOSPITAL_COMMUNITY): Payer: Self-pay | Admitting: Neurology

## 2012-06-21 DIAGNOSIS — I6350 Cerebral infarction due to unspecified occlusion or stenosis of unspecified cerebral artery: Principal | ICD-10-CM

## 2012-06-21 DIAGNOSIS — G20A1 Parkinson's disease without dyskinesia, without mention of fluctuations: Secondary | ICD-10-CM

## 2012-06-21 DIAGNOSIS — G20 Parkinson's disease: Secondary | ICD-10-CM

## 2012-06-21 DIAGNOSIS — R5381 Other malaise: Secondary | ICD-10-CM

## 2012-06-21 LAB — CBC WITH DIFFERENTIAL/PLATELET
Basophils Absolute: 0 10*3/uL (ref 0.0–0.1)
Basophils Relative: 0 % (ref 0–1)
Eosinophils Absolute: 0.2 10*3/uL (ref 0.0–0.7)
Eosinophils Relative: 3 % (ref 0–5)
MCH: 26.8 pg (ref 26.0–34.0)
MCHC: 31.7 g/dL (ref 30.0–36.0)
MCV: 84.3 fL (ref 78.0–100.0)
Platelets: 149 10*3/uL — ABNORMAL LOW (ref 150–400)
RDW: 15 % (ref 11.5–15.5)

## 2012-06-21 LAB — COMPREHENSIVE METABOLIC PANEL
AST: 21 U/L (ref 0–37)
Albumin: 3.3 g/dL — ABNORMAL LOW (ref 3.5–5.2)
Alkaline Phosphatase: 69 U/L (ref 39–117)
BUN: 14 mg/dL (ref 6–23)
CO2: 23 mEq/L (ref 19–32)
Chloride: 105 mEq/L (ref 96–112)
GFR calc non Af Amer: 73 mL/min — ABNORMAL LOW (ref >90)
Potassium: 3.7 mEq/L (ref 3.5–5.1)
Total Bilirubin: 0.4 mg/dL (ref 0.3–1.2)

## 2012-06-21 LAB — TSH: TSH: 2.271 u[IU]/mL (ref 0.350–4.500)

## 2012-06-21 LAB — GLUCOSE, CAPILLARY
Glucose-Capillary: 91 mg/dL (ref 70–99)
Glucose-Capillary: 103 mg/dL — ABNORMAL HIGH (ref 70–99)
Glucose-Capillary: 115 mg/dL — ABNORMAL HIGH (ref 70–99)

## 2012-06-21 LAB — RAPID URINE DRUG SCREEN, HOSP PERFORMED
Barbiturates: NOT DETECTED
Benzodiazepines: NOT DETECTED

## 2012-06-21 LAB — LIPID PANEL
Cholesterol: 176 mg/dL (ref 0–200)
HDL: 63 mg/dL (ref >39)
Total CHOL/HDL Ratio: 2.8 RATIO
Triglycerides: 56 mg/dL (ref <150)
VLDL: 11 mg/dL (ref 0–40)

## 2012-06-21 LAB — URINALYSIS, ROUTINE W REFLEX MICROSCOPIC
Bilirubin Urine: NEGATIVE
Nitrite: NEGATIVE
Specific Gravity, Urine: 1.009 (ref 1.005–1.030)
Urobilinogen, UA: 0.2 mg/dL (ref 0.0–1.0)

## 2012-06-21 LAB — CARDIAC PANEL(CRET KIN+CKTOT+MB+TROPI): Relative Index: INVALID (ref 0.0–2.5)

## 2012-06-21 LAB — MRSA PCR SCREENING: MRSA by PCR: NEGATIVE

## 2012-06-21 MED ORDER — DEXTROSE 5 % IV SOLN
1.0000 g | INTRAVENOUS | Status: DC
  Administered 2012-06-21 – 2012-06-22 (×2): 1 g via INTRAVENOUS
  Filled 2012-06-21 (×3): qty 10

## 2012-06-21 MED ORDER — ENSURE COMPLETE PO LIQD
237.0000 mL | Freq: Two times a day (BID) | ORAL | Status: DC
  Administered 2012-06-22 – 2012-06-24 (×5): 237 mL via ORAL

## 2012-06-21 MED ORDER — ENOXAPARIN SODIUM 40 MG/0.4ML ~~LOC~~ SOLN
40.0000 mg | SUBCUTANEOUS | Status: DC
  Administered 2012-06-22 – 2012-06-24 (×3): 40 mg via SUBCUTANEOUS
  Filled 2012-06-21 (×6): qty 0.4

## 2012-06-21 MED ORDER — DEXTROSE 5 % IV SOLN
500.0000 mg | INTRAVENOUS | Status: DC
  Administered 2012-06-21 – 2012-06-23 (×3): 500 mg via INTRAVENOUS
  Filled 2012-06-21 (×3): qty 500

## 2012-06-21 MED ORDER — TAMSULOSIN HCL 0.4 MG PO CAPS
0.4000 mg | ORAL_CAPSULE | Freq: Every day | ORAL | Status: DC
  Administered 2012-06-21 – 2012-06-24 (×4): 0.4 mg via ORAL
  Filled 2012-06-21 (×4): qty 1

## 2012-06-21 MED ORDER — FERROUS FUMARATE 325 (106 FE) MG PO TABS
1.0000 | ORAL_TABLET | Freq: Every day | ORAL | Status: DC
  Administered 2012-06-21 – 2012-06-24 (×4): 106 mg via ORAL
  Filled 2012-06-21 (×4): qty 1

## 2012-06-21 MED ORDER — BISACODYL 10 MG RE SUPP
10.0000 mg | Freq: Once | RECTAL | Status: AC
  Administered 2012-06-21: 10 mg via RECTAL
  Filled 2012-06-21: qty 1

## 2012-06-21 MED ORDER — ONDANSETRON HCL 4 MG/2ML IJ SOLN: 4.0000 mg | Freq: Three times a day (TID) | INTRAMUSCULAR | Status: AC | PRN

## 2012-06-21 MED ORDER — PRO-STAT SUGAR FREE PO LIQD
30.0000 mL | Freq: Two times a day (BID) | ORAL | Status: DC
  Administered 2012-06-21 – 2012-06-24 (×5): 30 mL via ORAL
  Filled 2012-06-21 (×7): qty 30

## 2012-06-21 MED ORDER — POLYETHYLENE GLYCOL 3350 17 G PO PACK
17.0000 g | PACK | Freq: Every day | ORAL | Status: DC
  Administered 2012-06-21 – 2012-06-23 (×3): 17 g via ORAL
  Filled 2012-06-21 (×4): qty 1

## 2012-06-21 MED ORDER — SODIUM CHLORIDE 0.9 % IV SOLN
INTRAVENOUS | Status: DC
  Administered 2012-06-21 (×3): via INTRAVENOUS

## 2012-06-21 NOTE — Progress Notes (Addendum)
Stroke Team Progress Note  HISTORY [name] is a 78 y/o RHAAM with multiple medical issues in addition to Neurodegenerative disorder and progressive dementia. According to the son, his father has stopped ambulating since April. He has been diagnosed with parkinson plus syndrome. And also has been having audio and visual hallucinations. He has significant weakness in all the extremities but according to the son he had more weakness 06/20/2012 on the left side compared to the right and the symptoms started sometimes around 2 pm. Difficult to say if his unresponsiveness and weakness are secondary to Neurodegenerative disorder and or deconditioning , less Likely to be stroke.   Patient seen in the ED. He was recently discharged from nursing home for stage 4 decubitus ulcer on the left foot, that was positive for MRSA.  He has completed the full course of ABX. Patient follows minimal commands. Not sure to his baseline, his son thinks he is back to normal  However, according to the son, patient has sleeping disorder and the way son describes I believe it is REM sleep disorder, additionally he also has narcolepsy and both audio and visual hallucinations  He is significantly kyphotic and unable to lay down completely  Patient was not a TPA candidate secondary to unknown time of onset. He was admitted for further evaluation and treatment.  SUBJECTIVE No family is at the bedside.  Overall he feels his condition is stable.   OBJECTIVE Most recent Vital Signs: Filed Vitals:   06/20/12 2259 06/21/12 0007 06/21/12 0115 06/21/12 0500  BP: 167/89  162/83 148/79  Pulse: 68  69 77  Temp:  98.5 F (36.9 C) 98 F (36.7 C) 98.1 F (36.7 C)  TempSrc:   Oral Oral  Resp: 20     Height:   6\' 1"  (1.854 m)   Weight:   77.202 kg (170 lb 3.2 oz)   SpO2: 97%  99% 97%   CBG (last 3)  Basename 06/21/12 0736  GLUCAP 91   Intake/Output from previous day:   IV Fluid Intake:     . sodium chloride 75 mL/hr  at 06/21/12 0306   MEDICATIONS    . acetaminophen  500 mg Oral BID  . aspirin EC  81 mg Oral Daily  . azithromycin  500 mg Intravenous Q24H  . cefTRIAXone (ROCEPHIN)  IV  1 g Intravenous Q24H  . enoxaparin  40 mg Subcutaneous Q24H  . ferrous fumarate  1 tablet Oral Daily  . hydrALAZINE  25 mg Oral Q8H  . levothyroxine  25 mcg Oral Q0600  . loratadine  10 mg Oral Daily  . memantine  10 mg Oral QPM  . piperacillin-tazobactam (ZOSYN)  IV  3.375 g Intravenous Once  . vancomycin  1,250 mg Intravenous Once  . vitamin B-12  5,000 mcg Oral Daily   PRN:  ondansetron (ZOFRAN) IV  Diet:  General thin liquids Activity:  As tolerated DVT Prophylaxis:  .[email or web address]   CLINICALLY SIGNIFICANT STUDIES Basic Metabolic Panel:  Lab 06/21/12 1610 06/20/12 2050  NA 141 136  K 3.7 3.8  CL 105 100  CO2 23 23  GLUCOSE 90 117*  BUN 14 16  CREATININE 0.87 1.01  CALCIUM 9.3 9.6  MG -- --  PHOS -- --   Liver Function Tests:  Lab 06/21/12 0335 06/20/12 2050  AST 21 22  ALT 9 10  ALKPHOS 69 74  BILITOT 0.4 0.3  PROT 7.5 7.8  ALBUMIN 3.3* 3.4*   CBC:  Lab 06/21/12 0335  06/20/12 2050  WBC 5.3 5.6  NEUTROABS 2.6 3.4  HGB 10.6* 10.6*  HCT 33.4* 33.0*  MCV 84.3 84.8  PLT 149* 149*   Coagulation:  Lab 06/20/12 2050  LABPROT 15.5*  INR 1.20   Cardiac Enzymes:  Lab 06/21/12 0312 06/20/12 2050  CKTOTAL 59 60  CKMB 2.5 2.5  CKMBINDEX -- --  TROPONINI <0.30 <0.30   Urinalysis:  Lab 06/20/12 2239  COLORURINE YELLOW  LABSPEC 1.013  PHURINE 6.0  GLUCOSEU NEGATIVE  HGBUR NEGATIVE  BILIRUBINUR NEGATIVE  KETONESUR NEGATIVE  PROTEINUR NEGATIVE  UROBILINOGEN 0.2  NITRITE NEGATIVE  LEUKOCYTESUR NEGATIVE   Lipid Panel    Component Value Date/Time   CHOL 176 06/21/2012 0313   TRIG 56 06/21/2012 0313   HDL 63 06/21/2012 0313   CHOLHDL 2.8 06/21/2012 0313   VLDL 11 06/21/2012 0313   LDLCALC 102* 06/21/2012 0313   HgbA1C  No results found for this basename: HGBA1C    Urine Drug Screen:   No  results found for this basename: labopia, cocainscrnur, labbenz, amphetmu, thcu, labbarb    Alcohol Level: No results found for this basename: ETH:2 in the last 168 hours  CT of the brain  06/20/2012   No gross acute intracranial abnormality.  Atrophy and chronic ischemic white matter disease.   MRI of the brain  06/21/2012 1. Acute lacunar infarct in the right cerebral white matter. No mass effect or hemorrhage. 2. No other acute intracranial abnormality.  MRA of the brain  06/21/2012 1. Stable and negative anterior circulation except for chronic ICA tortuosity. 2. Stable and negative posterior circulation except for loss of  Antegrade flow in the distal right vertebral artery today. I favor  this is either artifact or reflective of retrograde flow, rather than related to stenosis or occlusion.  2D Echocardiogram    Carotid Doppler    CXR  06/20/2012  Cardiac enlargement with probable infiltration or atelectasis in the lung bases.  Therapy Recommendations PT -HH, 24h supervision; OT   Physical Exam    GENERAL EXAM: Patient is in no distress  CARDIOVASCULAR: Regular rate and rhythm, no murmurs, no carotid bruits  NEUROLOGIC: MENTAL STATUS: awake, alert, language fluent, comprehension intact, naming intact; NOT ORIENTED TO YEAR. ORIENTED TO "HOSPITAL". CRANIAL NERVE: pupils equal and reactive to light, visual fields full to confrontation, extraocular muscles intact, no nystagmus, facial sensation and strength symmetric, uvula midline, shoulder shrug symmetric, tongue midline. MOTOR: INCREASED TONE IN BUE. NO TREMOR. Full strength in the BUE, RLE; LLE IN FOOT/LEG BRACE, WITH LEFT HF WEAKNESS. SENSORY: normal and symmetric to light touch. COORDINATION: finger-nose-finger, fine finger movements REFLEXES: deep tendon reflexes TRACE and symmetric GAIT/STATION: GAIT NOT ASSESSED.   ASSESSMENT [name] is a 78 y.o. male with a tiny,likely incidental high right parietal infarct  secondary to small vessel disease in setting of worsening of parkinson plus syndrome. On aspirin 81 mg orally every day prior to admission. Now on aspirin 81 mg orally every day for secondary stroke prevention. Patient with no resultant neuro symptoms.  -community acquired PNA -stage IV left foot decub -hypertension -hyperlipidemia, LDL 102 -family hx stroke, father  Patient with worsening of parkinson plus syndrome (MSA, DLB or PSP) and progressive instability of gait. MRI brain shows punctate right subcortical acute ischemic infarction. Could have aggravated his gait instability.  Hospital day # 1  TREATMENT/PLAN -Continue aspirin 81 mg orally every day for secondary stroke prevention. -no further stroke workup indicated - no TTE as he is  not a coumadin candidate due to fall risk; no carotid u/s as pt not a surgical candidate -home health PT -Stroke Service will sign off.   Annie Main, MSN, RN, ANVP-BC, ANP-BC, [facility] Pager: [phone] 06/21/2012 9:26 AM  Dr. Joycelyn Schmid has personally reviewed chart, pertinent data, examind the patient and developed the plan of care.  [facility] - Stroke Team Joycelyn Schmid, MD 06/21/2012, 7:11 PM   Please refer to [email or web address] for on-call Stroke MD

## 2012-06-21 NOTE — Evaluation (Signed)
Physical Therapy Evaluation Patient Details Name: Ricky [name] MRN: [id number] DOB: February 16, 1917 Today's Date: 06/21/2012 Time: 4098-1191 PT Time Calculation (min): 37 min  PT Assessment / Plan / Recommendation Clinical Impression  Patient s/p PNA with decr mobility secondary to decr balance and decr endurance.  Will benefit from PT to address balance and endurance.  Has 24 hour care.  Recommend HHPT f/u.    PT Assessment  Patient needs continued PT services    Follow Up Recommendations  Home health PT;Supervision/Assistance - 24 hour    Barriers to Discharge        Equipment Recommendations  None recommended by PT    Recommendations for Other Services     Frequency Min 3X/week    Precautions / Restrictions Precautions Precautions: Fall Restrictions Weight Bearing Restrictions: No   Pertinent Vitals/Pain VSS, No pain      Mobility  Bed Mobility Bed Mobility: Rolling Left;Left Sidelying to Sit;Sitting - Scoot to Edge of Bed Rolling Left: 3: Mod assist;With rail Left Sidelying to Sit: 3: Mod assist;With rails;HOB elevated Sitting - Scoot to Edge of Bed: 3: Mod assist (using pad) Details for Bed Mobility Assistance: PAtient took incr time to get to EOB.  Needed assist for elevation of trunk and cues to sequence movement.   Transfers Transfers: Sit to Stand;Stand to Sit;Stand Pivot Transfers Sit to Stand: 1: +2 Total assist;From elevated surface;With upper extremity assist;From bed Sit to Stand: Patient Percentage: 60% Stand to Sit: 1: +2 Total assist;With upper extremity assist;With armrests;To chair/3-in-1 Stand to Sit: Patient Percentage: 60% Stand Pivot Transfers: 1: +2 Total assist;With armrests Stand Pivot Transfers: Patient Percentage: 70% Details for Transfer Assistance: Patient needed cues for hand placement.  Patient raised up on bed quite high as he is 6 feet 1 inch tall.  Patient was able to get close to fully upright with slight hip and knee flexion.  Patient  needed manual assist to weight shift as he was unable to weight shift bil to pivot to chair.  Cues needed for patient to step around and sequence steps and RW to pivot bed to recliner.   Patient stated he needed to use bathroom, therefore obtained bedpan and put it under patient.  Patient tried to use but unable to use it.  Assisted off of the bedpan with Tot A of 2 (pt = 50%) for sit to stand secondary to lower surface.  Cleaned with total assist (did not go a lot).  Patient positioned comfortably in chair. Ambulation/Gait Ambulation/Gait Assistance: Not tested (comment) Stairs: No Wheelchair Mobility Wheelchair Mobility: No    Exercises General Exercises - Lower Extremity Long Arc Quad: AROM;Both;5 reps;Seated Hip Flexion/Marching: AROM;Both;5 reps;Seated   PT Diagnosis: Generalized weakness  PT Problem List: Decreased activity tolerance;Decreased balance;Decreased mobility;Decreased safety awareness;Decreased knowledge of use of DME PT Treatment Interventions: DME instruction;Gait training;Functional mobility training;Therapeutic activities;Therapeutic exercise;Balance training;Patient/family education   PT Goals Acute Rehab PT Goals PT Goal Formulation: With patient Time For Goal Achievement: 07/05/12 Potential to Achieve Goals: Good Pt will go Supine/Side to Sit: with supervision PT Goal: Supine/Side to Sit - Progress: Goal set today Pt will Sit at Edge of Bed: Independently;3-5 min;with bilateral upper extremity support PT Goal: Sit at Edge Of Bed - Progress: Goal set today Pt will Transfer Bed to Chair/Chair to Bed: with min assist PT Transfer Goal: Bed to Chair/Chair to Bed - Progress: Goal set today Pt will Ambulate: 16 - 50 feet;with min assist;with least restrictive assistive device PT Goal: Ambulate - Progress:  Goal set today  Visit Information  Last PT Received On: 06/21/12 Assistance Needed: +2    Subjective Data  Subjective: "I will try to get up." Patient Stated  Goal: To go home   Prior Functioning  Home Living Lives With: Son;Daughter Available Help at Discharge: Family;Personal care attendant;Available 24 hours/day (5 days/week, 3-4 hours day) Type of Home: House Home Access: Ramped entrance Home Layout: One level Bathroom Shower/Tub: Engineer, manufacturing systems: Standard Home Adaptive Equipment: Shower chair without back;Walker - rolling;Bedside commode/3-in-1;Wheelchair - manual Additional Comments: Patient reports using RW in home.  Nursing reports that family states patient has not been ambulating distances since April and just transferring bed to chair.   Prior Function Level of Independence: Needs assistance Needs Assistance: Bathing;Dressing;Feeding;Grooming;Toileting;Meal Prep;Light Housekeeping;Gait;Transfers Bath: Total Dressing: Total Feeding: Minimal Grooming: Moderate Toileting: Total Meal Prep: Total Light Housekeeping: Total Gait Assistance: Ambulated short distance only with RW with mod assist Transfer Assistance: Transferred stand pivot with RW with mod assist. Able to Take Stairs?: No Driving: No Vocation: Retired Musician:  (stutters)    Cognition  Overall Cognitive Status: History of cognitive impairments - at baseline Area of Impairment: Memory;Safety/judgement Arousal/Alertness: Awake/alert Orientation Level: Appears intact for tasks assessed Behavior During Session: [facility] for tasks performed Memory Deficits: decreased memory re: functional status PTA. Safety/Judgement: Decreased safety judgement for tasks assessed    Extremity/Trunk Assessment Right Upper Extremity Assessment RUE ROM/Strength/Tone: Deficits RUE ROM/Strength/Tone Deficits: Unable to raise UE above 90 degrees shoulder flexion and abduction, elbow WFL, grip WFL RUE Sensation: WFL - Light Touch Left Upper Extremity Assessment LUE ROM/Strength/Tone: Deficits LUE ROM/Strength/Tone Deficits: Unable to raise UE above 90  degrees shoulder flexion and abduction, elbow WFL, grip WFL LUE Sensation: WFL - Light Touch Right Lower Extremity Assessment RLE ROM/Strength/Tone: Deficits RLE ROM/Strength/Tone Deficits: Hip flexion 2/5, hip extension 2+/5, knee flexion 2+/5, knee extension 3-/5, ankle DF 3-/5, PF 3/5. RLE Sensation: WFL - Light Touch Left Lower Extremity Assessment LLE ROM/Strength/Tone: Deficits LLE ROM/Strength/Tone Deficits: Hip flexion 2/5, hip extension 2+/5, knee flexion 2+/5, knee extension 3-/5, ankle DF 3-/5, PF 3/5. LLE Sensation: WFL - Light Touch Trunk Assessment Trunk Assessment: Kyphotic   Balance Static Sitting Balance Static Sitting - Balance Support: Bilateral upper extremity supported;Feet supported Static Sitting - Level of Assistance: 3: Mod assist Static Sitting - Comment/# of Minutes: 3 minutes, leans posteriorly needed mod assist at times to sit upright at EOB.    End of Session PT - End of Session Equipment Utilized During Treatment: Gait belt Activity Tolerance: Patient limited by fatigue Patient left: in chair;with call bell/phone within reach;with chair alarm set Nurse Communication: Mobility status      INGOLD,Quinita Kostelecky 06/21/2012, 3:26 PM  [facility] Acute Rehabilitation [phone] (pager)

## 2012-06-21 NOTE — Progress Notes (Signed)
[facility] PROGRESS NOTE  IRA DOUGHER ZOX:[id number] DOB: 1917/08/26 DOA: 06/20/2012 PCP: Carollee Herter, MD  Assessment/Plan: Principal Problem:  *Community acquired pneumonia Active Problems:  HYPERTENSION, UNSPECIFIED  Alzheimer disease  Parkinsonian syndrome  Left-sided weakness    1. Pneumonia: Patient presented with altered mental status and CXR findings suggestive of pneumonia. This is community acquired, and patient is being managed with Rocephin/Azithro, now day# 1. He is afebrile at this time, and wcc is normal.   2. Left-sided weakness: This was part of patient's presenting complaints. Neurology consultation was provided by Dr Minus Breeding, and as patient presented outside the appropriate window, t-PA was not administered. Head CT scan was devoid of acute intracranial abnormalities. Patient is on neuro-checks, but has no discernible left -sided weakness at this time, or facial asymmetry. Will attempt to arrange brain MRI/MRA, although given anatomical limitations imposed by pronounced kyphosis, this may prove problematic. Patient is already on ASA. 3. History of Left foot stage IV decubitus ulcer: Managing with local care. Wound care team has been consulted. Will manage with local care and appropriate mattress overlay/Heel lift.  4. History of Parkinson's and dementia: Stable on pre-admission medications.  5. Hypertension: Appears sub-optimally controlled at this time. Will continue pre-admission anti-hypertensives, observe, and adjust as indicated.   6. Acute urinary retention: This AM , patient was unable to pass urine, and bladder scan revealed > 1000 mls, residual. Will manage with Foley catheterization, send of U/A and culture. Flomax will be started, as prostate problems are very likely, in this age group.      Code Status: Full Code. Family Communication:  Disposition Plan: To be determined.   Brief narrative:  78 year old male with history of  Parkinson's disease, dementia who was admitted in April 2013 for vasovagal syncope and UTI was brought to the ER when patient's son noticed patient was having increased confusion and left-sided weakness. Patient was brought as a code stroke. Since patient was outside the TPA window period the code was canceled. Neurologist evaluated the patient. CT head was done which was negative for anything acute. Chest x-ray showed possible infiltrates.  Consultants:  Dr Eilleen Kempf, neurologist.  Procedures:  Head CT scan/CXR.  Antibiotics:  Rocephin/Azithromycin, started 06/21/12.   HPI/Subjective: Unable to pass urine this AM. Bladder scan revealed residual of >1000 mls.  Objective: Vital signs in last 24 hours: Temp:  [98 F (36.7 C)-99.4 F (37.4 C)] 98.1 F (36.7 C) (08/05 0500) Pulse Rate:  [68-77] 77  (08/05 0500) Resp:  [20] 20  (08/04 2259) BP: (148-167)/(79-94) 148/79 mmHg (08/05 0500) SpO2:  [96 %-99 %] 97 % (08/05 0500) FiO2 (%):  [2 %] 2 % (08/05 0115) Weight:  [77.202 kg (170 lb 3.2 oz)] 77.202 kg (170 lb 3.2 oz) (08/05 0115) Weight change:  Last BM Date:  (unknown-per report no BM x3 days)  Intake/Output from previous day:       Physical Exam: Gneral: Comfortable, alert, communicative, pleasantly demented, not short of breath at rest. Following simple commands, accurately. HEENT:  Mild clinical pallor, no jaundice, no conjunctival injection or discharge. Hydration status is fair.  NECK:  Supple, JVP not seen, no carotid bruits, no palpable lymphadenopathy, no palpable goiter. CHEST:  No wheezes, few crackles right base. HEART:  Sounds 1 and 2 heard, normal, regular, no murmurs. ABDOMEN:  Full, soft, non-tender, no palpable organomegaly, no palpable masses, normal bowel sounds. GENITALIA:  Not examined. LOWER EXTREMITIES:  No pitting edema, palpable peripheral pulses. MUSCULOSKELETAL SYSTEM:  Generalized osteoarthritic  changes, otherwise, normal. CENTRAL NERVOUS SYSTEM:  No  focal neurologic deficit on gross examination.  Lab Results:  Basename 06/21/12 0335 06/20/12 2050  WBC 5.3 5.6  HGB 10.6* 10.6*  HCT 33.4* 33.0*  PLT 149* 149*    Basename 06/21/12 0335 06/20/12 2050  NA 141 136  K 3.7 3.8  CL 105 100  CO2 23 23  GLUCOSE 90 117*  BUN 14 16  CREATININE 0.87 1.01  CALCIUM 9.3 9.6   No results found for this or any previous visit (from the past 240 hour(s)).   Studies/Results: Ct Head Wo Contrast  06/20/2012  *RADIOLOGY REPORT*  Clinical Data: .  Confusion.  Weakness.  CT HEAD WITHOUT CONTRAST  Technique:  Contiguous axial images were obtained from the base of the skull through the vertex without contrast.  Comparison: None.  Findings: The patient is markedly kyphotic and the patient was unable to be scanned in the normal supine position. Atrophy and chronic ischemic white matter disease is present.  There is no gross acute intracranial abnormality identified.  Atherosclerosis is present.  Right sphenoid sinus disease present.  Mastoid air cells clear.  IMPRESSION: No gross acute intracranial abnormality.  Atrophy and chronic ischemic white matter disease.  Original Report Authenticated By: Andreas Newport, M.D.   Dg Chest Port 1 View  06/20/2012  *RADIOLOGY REPORT*  Clinical Data: Code stroke.  PORTABLE CHEST - 1 VIEW  Comparison: 02/22/2012  Findings: Shallow inspiration.  Cardiac enlargement with normal pulmonary vascularity for technique.  Probable infiltration or atelectasis in both lung bases.  No pneumothorax.  No blunting of costophrenic angles.  Calcified granuloma in the right midlung. Calcification and torsion of the aorta.  Degenerative changes in the spine and shoulders.  No significant change since previous study.  IMPRESSION: Cardiac enlargement with probable infiltration or atelectasis in the lung bases.  Original Report Authenticated By: Marlon Pel, M.D.    Medications: Scheduled Meds:   . acetaminophen  500 mg Oral BID  .  aspirin EC  81 mg Oral Daily  . azithromycin  500 mg Intravenous Q24H  . cefTRIAXone (ROCEPHIN)  IV  1 g Intravenous Q24H  . enoxaparin  40 mg Subcutaneous Q24H  . ferrous fumarate  1 tablet Oral Daily  . hydrALAZINE  25 mg Oral Q8H  . levothyroxine  25 mcg Oral Q0600  . loratadine  10 mg Oral Daily  . memantine  10 mg Oral QPM  . piperacillin-tazobactam (ZOSYN)  IV  3.375 g Intravenous Once  . vancomycin  1,250 mg Intravenous Once  . vitamin B-12  5,000 mcg Oral Daily   Continuous Infusions:   . sodium chloride 75 mL/hr at 06/21/12 0306   PRN Meds:.ondansetron (ZOFRAN) IV    LOS: 1 day   Tyjai Matuszak,CHRISTOPHER  [facility] Pager [phone]. If 8PM-8AM, please contact night-coverage at [email or web address], password [facility] 06/21/2012, 8:07 AM  LOS: 1 day

## 2012-06-21 NOTE — Progress Notes (Signed)
INITIAL ADULT NUTRITION ASSESSMENT Date: 06/21/2012   Time: 12:37 PM  INTERVENTION:  Ensure Complete twice daily between meals (350 kcals, 13 gm protein per 8 fl oz bottle)  Prostat liquid protein 30 ml twice daily with meals (100 kcals, 15 gm protein per dose)  RD to follow for nutrition care plan  Reason for Assessment: Low Braden  ASSESSMENT: Male 78 y.o.  Dx: Community acquired pneumonia  Hx:  Past Medical History  Diagnosis Date  . IBS (irritable bowel syndrome)   . Diverticulosis   . Cataract   . OAB (overactive bladder)   . Macular degeneration   . Alzheimer disease   . Hypertension   . Arthritis   . Osteoporosis   . Parkinsonian syndrome   . Syncope, vasovagal   . Neuromuscular disorder     parkisonian syndrome  . Dementia due to Parkinson's disease without behavioral disturbance     Related Meds:     . acetaminophen  500 mg Oral BID  . aspirin EC  81 mg Oral Daily  . azithromycin  500 mg Intravenous Q24H  . cefTRIAXone (ROCEPHIN)  IV  1 g Intravenous Q24H  . enoxaparin  40 mg Subcutaneous Q24H  . ferrous fumarate  1 tablet Oral Daily  . hydrALAZINE  25 mg Oral Q8H  . levothyroxine  25 mcg Oral Q0600  . loratadine  10 mg Oral Daily  . memantine  10 mg Oral QPM  . piperacillin-tazobactam (ZOSYN)  IV  3.375 g Intravenous Once  . Tamsulosin HCl  0.4 mg Oral Daily  . vancomycin  1,250 mg Intravenous Once  . vitamin B-12  5,000 mcg Oral Daily    Ht: 6\' 1"  (185.4 cm)  Wt: 170 lb 3.2 oz (77.202 kg)  Ideal Wt: 83.6 kg % Ideal Wt: 92%  Usual Wt: 76.8 kg -- April 2013 % Usual Wt: 110%  Body mass index is 22.46 kg/(m^2).  Food/Nutrition Related Hx: admission nutrition screen incomplete  Labs:  CMP     Component Value Date/Time   NA 141 06/21/2012 0335   K 3.7 06/21/2012 0335   CL 105 06/21/2012 0335   CO2 23 06/21/2012 0335   GLUCOSE 90 06/21/2012 0335   BUN 14 06/21/2012 0335   CREATININE 0.87 06/21/2012 0335   CALCIUM 9.3 06/21/2012 0335   PROT 7.5  06/21/2012 0335   ALBUMIN 3.3* 06/21/2012 0335   AST 21 06/21/2012 0335   ALT 9 06/21/2012 0335   ALKPHOS 69 06/21/2012 0335   BILITOT 0.4 06/21/2012 0335   GFRNONAA 73* 06/21/2012 0335   GFRAA 84* 06/21/2012 0335     Intake/Output Summary (Last 24 hours) at 06/21/12 1242 Last data filed at 06/21/12 0800  Gross per 24 hour  Intake      0 ml  Output   1200 ml  Net  -1200 ml    CBG (last 3)   Basename 06/21/12 0736  GLUCAP 91    Diet Order: General  Supplements/Tube Feeding: N/A  IVF:    sodium chloride Last Rate: 50 mL/hr at 06/21/12 1111    Estimated Nutritional Needs:   Kcal: 1900-2100 Protein: 95-105 gm Fluid: 1.9-2.1 L  RD unable to obtain nutrition hx from patient; admitted for increased confusion and left-sided weakness; CT head was done which was negative for anything acute; chest x-ray showed possible infiltrates; no % PO intake recorded at this time; noted patient with Stage IV left foot decubitus ulcer -- at risk for further skin breakdown given current low braden  score; would benefit from addition of nutrition supplement --- RD to order.  NUTRITION DIAGNOSIS: -Increased nutrient needs (NI-5.1).  Status: Ongoing  RELATED TO: wound healing  AS EVIDENCE BY: estimated nutrition needs  MONITORING/EVALUATION(Goals): Goal: Oral intake with meals & supplements to meet >/= 90% of estimated nutrition needs Monitor: PO & supplemental intake, weight, labs, I/O's  EDUCATION NEEDS: -No education needs identified at this time  Dietitian #: [phone]  DOCUMENTATION CODES Per approved criteria  -Not Applicable    Alger Memos 06/21/2012, 12:37 PM

## 2012-06-21 NOTE — Progress Notes (Signed)
Pt complaining of "having to pee but can't". Bladder scanned pt >1000. MD called. Placed foley in got 1200cc out. Will continue to monitor.

## 2012-06-21 NOTE — Consult Note (Signed)
WOC consult Note Reason for Consult: Consult requested for left heel wound.  Appears to be a chronic wound which pt states is treated by the outpatient wound care center. Wound type: Unable to accurately stage since wound history is unknown,  it is consistent with a healing previous stage 3 wound. Pressure Ulcer POA: Yes Measurement: 2.5X2.5cm Wound bed: Steristrips are holding a mepitel layer of contact dressing over wound bed with foam over outer area.  Apparently, the wound care center does not want the wound bed disturbed.  This type of dressing is consistent with a skin substitute graft site. Drainage (amount, consistency, odor) Small tan drainage to outer foam dressing.   Periwound: Intact skin surrounding.  Heel lift boot intact to reduce pressure. Dressing procedure/placement/frequency: Mepitel and steristrip dressing area was not disturbed.  Wound bed appears pink through dressing, no odor.  Silicone foam dressing applied over site and kerlex to protect.  Pt can resume followup with outpatient wound care center after discharge.  Dressing can remain in place until next Mon. Will not plan to follow further unless re-consulted.  [address], RN, MSN, [employer]  [phone]

## 2012-06-21 NOTE — ED Notes (Signed)
Report called to 3000 

## 2012-06-22 ENCOUNTER — Inpatient Hospital Stay (HOSPITAL_COMMUNITY): Payer: Medicare Other

## 2012-06-22 DIAGNOSIS — I639 Cerebral infarction, unspecified: Secondary | ICD-10-CM | POA: Diagnosis present

## 2012-06-22 LAB — GLUCOSE, CAPILLARY
Glucose-Capillary: 95 mg/dL (ref 70–99)
Glucose-Capillary: 105 mg/dL — ABNORMAL HIGH (ref 70–99)
Glucose-Capillary: 75 mg/dL (ref 70–99)
Glucose-Capillary: 92 mg/dL (ref 70–99)

## 2012-06-22 LAB — BASIC METABOLIC PANEL
CO2: 26 mEq/L (ref 19–32)
Chloride: 105 mEq/L (ref 96–112)
Glucose, Bld: 91 mg/dL (ref 70–99)
Potassium: 3.3 mEq/L — ABNORMAL LOW (ref 3.5–5.1)
Sodium: 140 mEq/L (ref 135–145)

## 2012-06-22 LAB — URINE CULTURE

## 2012-06-22 LAB — CBC
Hemoglobin: 10.4 g/dL — ABNORMAL LOW (ref 13.0–17.0)
Platelets: 134 10*3/uL — ABNORMAL LOW (ref 150–400)
RBC: 3.68 MIL/uL — ABNORMAL LOW (ref 4.22–5.81)
WBC: 4.6 10*3/uL (ref 4.0–10.5)

## 2012-06-22 MED ORDER — ATORVASTATIN CALCIUM 10 MG PO TABS
10.0000 mg | ORAL_TABLET | Freq: Every day | ORAL | Status: DC
  Administered 2012-06-22 – 2012-06-23 (×2): 10 mg via ORAL
  Filled 2012-06-22 (×3): qty 1

## 2012-06-22 MED ORDER — ASPIRIN EC 325 MG PO TBEC
325.0000 mg | DELAYED_RELEASE_TABLET | Freq: Every day | ORAL | Status: DC
  Administered 2012-06-23 – 2012-06-24 (×2): 325 mg via ORAL
  Filled 2012-06-22 (×2): qty 1

## 2012-06-22 MED ORDER — BIOTENE DRY MOUTH MT LIQD
15.0000 mL | Freq: Two times a day (BID) | OROMUCOSAL | Status: DC
  Administered 2012-06-22 – 2012-06-24 (×4): 15 mL via OROMUCOSAL

## 2012-06-22 MED ORDER — POTASSIUM CHLORIDE CRYS ER 20 MEQ PO TBCR
40.0000 meq | EXTENDED_RELEASE_TABLET | Freq: Once | ORAL | Status: AC
  Administered 2012-06-22: 40 meq via ORAL
  Filled 2012-06-22: qty 2

## 2012-06-22 NOTE — Progress Notes (Signed)
I agree with the following treatment note after reviewing documentation.   Johnston, Lolamae Voisin Brynn   OTR/L Pager: [phone] Office: [phone] .   

## 2012-06-22 NOTE — Progress Notes (Signed)
Orthopedic Tech Progress Note Patient Details:  Ricky [name] [birth year] [id number] Brace order fitted by [employer] vendor [employer]enc[employer] Partridge. Patient ID: [name], male   DOB: 06-24-1917, 78 y.o.   MRN: [id number]   Jennye Moccasin 06/22/2012, 8:33 PM

## 2012-06-22 NOTE — ED Provider Notes (Signed)
I saw and evaluated the patient, reviewed the resident's note and I agree with the findings and plan.  Makara Lanzo T Kiren Mcisaac, MD 06/22/12 0835 

## 2012-06-22 NOTE — Care Management Note (Signed)
    Page 1 of 2   06/24/2012     10:50:17 AM   CARE MANAGEMENT NOTE 06/24/2012  Patient:  Ricky [name], Ricky [name]   Account Number:  [id number]  Date Initiated:  06/22/2012  Documentation initiated by:  GRAVES-BIGELOW,BRENDA  Subjective/Objective Assessment:   Pt admitted with l sided weakness and  treating for PNA. Pt is from home with son. PT is working with pt.     Action/Plan:   CM will continue to monitor for disposition needs.   Anticipated DC Date:  06/25/2012   Anticipated DC Plan:  HOME W HOME HEALTH SERVICES      DC Planning Services  CM consult      [facility] Choice  HOME HEALTH  Resumption Of Svcs/PTA Provider   Choice offered to / List presented to:  C-4 Adult Children        HH arranged  HH-1 RN  HH-2 PT  HH-3 OT      [facility] agency  [facility] Care   Status of service:  Completed, signed off Medicare Important Message given?   (If response is "NO", the following Medicare IM given date fields will be blank) Date Medicare IM given:   Date Additional Medicare IM given:    Discharge Disposition:  HOME W HOME HEALTH SERVICES  Per UR Regulation:  Reviewed for med. necessity/level of care/duration of stay  If discussed at Long Length of Stay Meetings, dates discussed:    Comments:  Damita Lack  06/24/12- 1015- Donn Pierini RN, BSN [phone] Pt for d/c today, call made to [facility]-  spoke with Martha-confirmed that pt is active with them and services to be resumed per family choice with [facility]. HH orders along with d/c summary faxed to [facility]- [phone]) and per [place] services will resume tomorrow 06/25/12. Family to come to transport pt per private vehicle as per conversation with pt's son and daughter on 06/23/12- pt has appointment at wound center today.   06/23/12- 1200- Donn Pierini RN, BSN [phone] Spoke with pt's son [name], and daughter [name] via [place]- per conversation pt has been active with [facility] and that is whom they want to continue [facility]  services with. Pt has all needed DME at home including w/c, walker, gait belt, bath sheet, bed elevator to 90 degrees. Pt also has a private pay personal CNA. Plan is for pt to return home with son via private vehicle. Will need HH orders for RN/PT/OT-

## 2012-06-22 NOTE — Progress Notes (Signed)
UR Completed Jillyan Plitt Graves-Bigelow, RN,BSN [phone]  

## 2012-06-22 NOTE — Procedures (Signed)
Objective Swallowing Evaluation: Modified Barium Swallowing Study  Patient Details  Name: Ricky [name] MRN: [id number] Date of Birth: August 25, 1917  Today's Date: 06/22/2012 Time: 1350-1410 SLP Time Calculation (min): 20 min  Past Medical History:  Past Medical History  Diagnosis Date  . IBS (irritable bowel syndrome)   . Diverticulosis   . Cataract   . OAB (overactive bladder)   . Macular degeneration   . Alzheimer disease   . Hypertension   . Arthritis   . Osteoporosis   . Parkinsonian syndrome   . Syncope, vasovagal   . Neuromuscular disorder     parkisonian syndrome  . Dementia due to Parkinson's disease without behavioral disturbance    Past Surgical History:  Past Surgical History  Procedure Date  . Esophagogastroduodenoscopy   . Eye surgery     LEFT CATARACT   HPI:  78 year old male with history of Parkinson's disease, dementia who was admitted in April 2013 for vasovagal syncope and UTI was brought to the ER when patient's son noticed patient was having increased confusion and left-sided weakness. Diagnosed with right parietal CVA and CAP.      Assessment / Plan / Recommendation Clinical Impression  Dysphagia Diagnosis: Mild oral phase dysphagia;Mild pharyngeal phase dysphagia;Moderate cervical esophageal phase dysphagia Clinical impression: Patient presents with a moderate esophageal and a mild oropharyngeal dysphagia although able to fully protect the airway. Oropharyngeal phase characterized by delayed oral tranist with tongue pumping, peicemealing, and mastication of pill likely related to baseline dementia, followed by delayed swallow initiation and full airway protection with both liquids and solids. Increased aspiration risk however presents due to a moderate esophageal dysphagia due to the presence of a CP bar resulting in backflow of bolus into the pyriform sinuses post swallow with inconsistent sensation. Verbal and tactily cued dry swallow successful in  aiding esophageal clearance of bolus. Recommend dysphagia 3 diet with thin liquids to decrease mastication time, therefore decreasing time between swallows to faciliate esophageal clearance.     Treatment Recommendation  Therapy as outlined in treatment plan below    Diet Recommendation Dysphagia 3 (Mechanical Soft);Thin liquid   Liquid Administration via: Cup;Straw Medication Administration: Other (Comment) (whole or crushed as tolerated) Supervision: Patient able to self feed;Full supervision/cueing for compensatory strategies Compensations: Slow rate;Small sips/bites;Multiple dry swallows after each bite/sip Postural Changes and/or Swallow Maneuvers: Seated upright 90 degrees;Upright 30-60 min after meal    Other  Recommendations Oral Care Recommendations: Oral care BID   Follow Up Recommendations  None    Frequency and Duration min 2x/week  2 weeks   Pertinent Vitals/Pain n/a    SLP Swallow Goals Patient will consume recommended diet without observed clinical signs of aspiration with: Maximum assistance Swallow Study Goal #1 - Progress: Not Met Patient will utilize recommended strategies during swallow to increase swallowing safety with: Maximum assistance Swallow Study Goal #2 - Progress: Not met   General HPI: 78 year old male with history of Parkinson's disease, dementia who was admitted in April 2013 for vasovagal syncope and UTI was brought to the ER when patient's son noticed patient was having increased confusion and left-sided weakness. Diagnosed with right parietal CVA and CAP.  Type of Study: Modified Barium Swallowing Study Reason for Referral: Objectively evaluate swallowing function Previous Swallow Assessment: MBS 03/19/09 indicated a moderate dysphagia with esophageal component however no aspiration/penetration.  Diet Prior to this Study: Regular;Thin liquids Temperature Spikes Noted: No Respiratory Status: Room air History of Recent Intubation:  No Behavior/Cognition: Alert;Cooperative;Pleasant mood;Requires cueing;Decreased sustained attention Oral  Cavity - Dentition: Dentures, top;Dentures, bottom Oral Motor / Sensory Function: Within functional limits Self-Feeding Abilities: Able to feed self Patient Positioning: Upright in chair Baseline Vocal Quality: Clear Volitional Cough:  (NT) Volitional Swallow: Able to elicit Anatomy: Other (Comment) (CP bar noted) Pharyngeal Secretions: Not observed secondary MBS    Reason for Referral Objectively evaluate swallowing function             Jonia Oakey Meryl 06/22/2012, 2:24 PM

## 2012-06-22 NOTE — Progress Notes (Signed)
Orthopedic Tech Progress Note Patient Details:  Ricky [name] [birth year] [id number]  Patient ID: Ricky [name], male   DOB: February 16, 1917, 78 y.o.   MRN: [id number]   [name] 06/22/2012, 11:15 AM Called bio tech for replacement prafo boot

## 2012-06-22 NOTE — Progress Notes (Signed)
[facility] PROGRESS NOTE  Ricky [name] FAO:[id number] DOB: 20-Jun-1917 DOA: 06/20/2012 PCP: Carollee Herter, MD  Assessment/Plan: Principal Problem:  *Community acquired pneumonia Active Problems:  HYPERTENSION, UNSPECIFIED  Alzheimer disease  Parkinsonian syndrome  Left-sided weakness    1. Pneumonia: Patient presented with altered mental status and CXR findings suggestive of pneumonia. This is community acquired, and patient is being managed with Rocephin/Azithro, now day# 2. He is afebrile at this time, and wcc is normal.   2. Left-sided weakness/Right CVA: This was part of patient's presenting complaints. Neurology consultation was provided by Dr Minus Breeding, and as patient presented outside the appropriate window, t-PA was not administered. Head CT scan was devoid of acute intracranial abnormalities. Patient is on neuro-checks, but has no discernible left -sided weakness at this time, or facial asymmetry. Brain MRI showed acute lacunar infarct in the right cerebral white matter. MRA showed stable and negative anterior circulation except for chronic ICA tortuosity. Patient was already on ASA 81 mg, pre-admission. We have increased this to 325 mg daily, for secondary prevention. SLP evaluation has been recommended, particularly, in view of pneumonia. Per Dr Marjory Lies, no further CVA work up is indicated, as patient is not a candidate for either anticoagulation or vascular surgery.  3. Dyslipidemia: Lipid profile revealed TC 176, TG 56, HDL 63 and LDL 102. Statin has been commenced.  4. History of Left foot stage IV decubitus ulcer: Managing with local care. Managing per wound care team recommendations. Local care and appropriate mattress overlay/Heel lift.  5. History of Parkinson's and dementia: Stable on pre-admission medications.  6. Hypertension: Appears sub-optimally controlled at this time, but reasonable in context of acute CVA. Will continue pre-admission  anti-hypertensives, and observe.   7. Acute urinary retention: On 06/21/12 AM , patient was unable to pass urine, and bladder scan revealed > 1000 mls, residual. Managed with Foley catheterization and U/A  Is negative. Flomax was started on 06/21/12, as prostate problems are very likely, in this age group. Perhaps a voiding trial can be attempted on 06/23/12.      Code Status: Full Code. Family Communication:  Disposition Plan: Aiming discharge in next few days.   Brief narrative:  78 year old male with history of Parkinson's disease, dementia who was admitted in April 2013 for vasovagal syncope and UTI was brought to the ER when patient's son noticed patient was having increased confusion and left-sided weakness. Patient was brought as a code stroke. Since patient was outside the TPA window period the code was canceled. Neurologist evaluated the patient. CT head was done which was negative for anything acute. Chest x-ray showed possible infiltrates.  Consultants:  Dr Eilleen Kempf, neurologist.  Procedures:  Head CT scan/CXR.  Antibiotics:  Rocephin/Azithromycin, started 06/21/12.   HPI/Subjective: Unable to pass urine this AM. Bladder scan revealed residual of >1000 mls.  Objective: Vital signs in last 24 hours: Temp:  [98.4 F (36.9 C)-99.7 F (37.6 C)] 99.4 F (37.4 C) (08/06 0800) Pulse Rate:  [84-94] 86  (08/06 0800) Resp:  [17-20] 17  (08/06 0800) BP: (148-158)/(84-96) 152/96 mmHg (08/06 0800) SpO2:  [94 %-97 %] 94 % (08/06 0800) Weight change:  Last BM Date:  (unknown-per report no BM x3 days)  Intake/Output from previous day: 08/05 0701 - 08/06 0700 In: -  Out: 2500 [Urine:2500] Total I/O In: -  Out: 1 [Stool:1]   Physical Exam: Gneral: Comfortable, alert, communicative, pleasantly demented, not short of breath at rest. Following simple commands, accurately. HEENT:  Mild clinical pallor, no  jaundice, no conjunctival injection or discharge. Hydration status is fair.    NECK:  Supple, JVP not seen, no carotid bruits, no palpable lymphadenopathy, no palpable goiter. CHEST:  No wheezes, few crackles right base. HEART:  Sounds 1 and 2 heard, normal, regular, no murmurs. ABDOMEN:  Full, soft, non-tender, no palpable organomegaly, no palpable masses, normal bowel sounds. GENITALIA:  Not examined. LOWER EXTREMITIES:  No pitting edema, palpable peripheral pulses. Left foot under dressings.  MUSCULOSKELETAL SYSTEM:  Generalized osteoarthritic changes, otherwise, normal. CENTRAL NERVOUS SYSTEM:  No focal neurologic deficit on gross examination.  Lab Results:  Basename 06/22/12 0507 06/21/12 0335  WBC 4.6 5.3  HGB 10.4* 10.6*  HCT 31.2* 33.4*  PLT 134* 149*    Basename 06/22/12 0507 06/21/12 0335  NA 140 141  K 3.3* 3.7  CL 105 105  CO2 26 23  GLUCOSE 91 90  BUN 10 14  CREATININE 0.82 0.87  CALCIUM 8.6 9.3   Recent Results (from the past 240 hour(s))  CULTURE, BLOOD (ROUTINE X 2)     Status: Normal (Preliminary result)   Collection Time   06/20/12  9:40 PM      Component Value Range Status Comment   Specimen Description BLOOD RIGHT HAND   Final    Special Requests BOTTLES DRAWN AEROBIC AND ANAEROBIC 10CC EA   Final    Culture  Setup Time 06/21/2012 02:52   Final    Culture     Final    Value:        BLOOD CULTURE RECEIVED NO GROWTH TO DATE CULTURE WILL BE HELD FOR 5 DAYS BEFORE ISSUING A FINAL NEGATIVE REPORT   Report Status PENDING   Incomplete   CULTURE, BLOOD (ROUTINE X 2)     Status: Normal (Preliminary result)   Collection Time   06/20/12  9:50 PM      Component Value Range Status Comment   Specimen Description BLOOD RIGHT HAND   Final    Special Requests BOTTLES DRAWN AEROBIC AND ANAEROBIC 10CC EA   Final    Culture  Setup Time 06/21/2012 02:52   Final    Culture     Final    Value:        BLOOD CULTURE RECEIVED NO GROWTH TO DATE CULTURE WILL BE HELD FOR 5 DAYS BEFORE ISSUING A FINAL NEGATIVE REPORT   Report Status PENDING   Incomplete    MRSA PCR SCREENING     Status: Normal   Collection Time   06/21/12 11:25 AM      Component Value Range Status Comment   MRSA by PCR NEGATIVE  NEGATIVE Final      Studies/Results: Ct Head Wo Contrast  06/21/2012  *RADIOLOGY REPORT*  Clinical Data: 78 year old male with left side weakness.  CT HEAD WITHOUT CONTRAST  Technique:  Contiguous axial images were obtained from the base of the skull through the vertex without contrast.  Comparison: Head CTs 06/20/2012 and earlier.  Findings: Limited due to difficult patient positioning and kyphosis.  Quasi coronal images are produced.  No acute osseous abnormality identified.  Chronic sphenoid sinus disease.  Other Visualized paranasal sinuses and mastoids are clear.  Visualized orbits and scalp soft tissues are within normal limits.  No ventriculomegaly. No midline shift, mass effect, or evidence of mass lesion.  No acute intracranial hemorrhage identified.  Grossly stable gray-white matter differentiation throughout the brain including left occipital periventricular hypodensity. No evidence of cortically based acute infarction identified.  IMPRESSION: Limited by difficult patient  positioning, kyphosis.  No gross acute intracranial abnormality.  Original Report Authenticated By: Harley Hallmark, M.D.   Ct Head Wo Contrast  06/20/2012  *RADIOLOGY REPORT*  Clinical Data: .  Confusion.  Weakness.  CT HEAD WITHOUT CONTRAST  Technique:  Contiguous axial images were obtained from the base of the skull through the vertex without contrast.  Comparison: None.  Findings: The patient is markedly kyphotic and the patient was unable to be scanned in the normal supine position. Atrophy and chronic ischemic white matter disease is present.  There is no gross acute intracranial abnormality identified.  Atherosclerosis is present.  Right sphenoid sinus disease present.  Mastoid air cells clear.  IMPRESSION: No gross acute intracranial abnormality.  Atrophy and chronic ischemic white  matter disease.  Original Report Authenticated By: Andreas Newport, M.D.   Mr Maxine Glenn Head Wo Contrast  06/21/2012  *RADIOLOGY REPORT*  Clinical Data:  78 year old male with weakness, confusion.  Comparison: Head CTs 06/21/2012 and earlier.  Brain MRI and MRA 12/17/2009.  MRI HEAD WITHOUT CONTRAST  Technique: Multiplanar, multiecho pulse sequences of the brain and surrounding structures were obtained according to standard protocol without intravenous contrast.  Findings: Punctate restricted diffusion in the right corona radiata (series 9 image 19).  No other definite restricted diffusion in the brain.  No associated mass effect or hemorrhage.  Cerebral volume loss.  No ventriculomegaly. No midline shift, mass effect, or evidence of mass lesion.  Chronic periventricular white matter T2 and FLAIR hyperintensity is not significantly changed since 2011. Major intracranial vascular flow voids are stable. Negative pituitary.  Stable cervicomedullary junction with degenerative ligamentous hypertrophy about the odontoid.  Grossly stable visualized cervical spine.  Normal bone marrow signal. Postoperative changes to the globes. Sphenoid sinus opacification. Other visualized paranasal sinuses and mastoids are clear.  IMPRESSION: 1.  Acute lacunar infarct in the right cerebral white matter.  No mass effect or hemorrhage. 2.  No other acute intracranial abnormality. 3.  MRA findings below.  MRA HEAD WITHOUT CONTRAST  Technique: Angiographic images of the Circle of Willis were obtained using MRA technique without  intravenous contrast.  Findings: Interval loss of antegrade flow signal in the distal right vertebral artery.  Antegrade signal seen in the distal cervical right vertebral segment and near the vertebrobasilar junction.  The MRI flow void with preserved, I favor this represents artifact or reversed flow direction rather than stenosis or occlusion.  Antegrade flow in the distal left vertebral artery. Vertebrobasilar  junction is patent.  Basilar arteries pain without stenosis.  SCA and PCA origins are patent.  Bilateral PCA branches are stable and within normal limits.  Posterior communicating arteries are diminutive or absent.  Antegrade flow signal in both ICA siphons.  Chronic ICA tortuosity. No ICA stenosis.  Carotid termini remain within normal limits.  MCA and ACA origins remain within normal limits.  Visualized bilateral MCA and ACA branches are stable within normal limits.  IMPRESSION: 1.  Stable and negative anterior circulation except for chronic ICA tortuosity. 2.  Stable and negative posterior circulation except for loss of antegrade flow in the distal right vertebral artery today.  I favor this is either artifact or reflective of retrograde flow, rather than related to stenosis or occlusion.  Original Report Authenticated By: Harley Hallmark, M.D.   Mr Brain Wo Contrast  06/21/2012  *RADIOLOGY REPORT*  Clinical Data:  78 year old male with weakness, confusion.  Comparison: Head CTs 06/21/2012 and earlier.  Brain MRI and MRA 12/17/2009.  MRI HEAD  WITHOUT CONTRAST  Technique: Multiplanar, multiecho pulse sequences of the brain and surrounding structures were obtained according to standard protocol without intravenous contrast.  Findings: Punctate restricted diffusion in the right corona radiata (series 9 image 19).  No other definite restricted diffusion in the brain.  No associated mass effect or hemorrhage.  Cerebral volume loss.  No ventriculomegaly. No midline shift, mass effect, or evidence of mass lesion.  Chronic periventricular white matter T2 and FLAIR hyperintensity is not significantly changed since 2011. Major intracranial vascular flow voids are stable. Negative pituitary.  Stable cervicomedullary junction with degenerative ligamentous hypertrophy about the odontoid.  Grossly stable visualized cervical spine.  Normal bone marrow signal. Postoperative changes to the globes. Sphenoid sinus opacification.  Other visualized paranasal sinuses and mastoids are clear.  IMPRESSION: 1.  Acute lacunar infarct in the right cerebral white matter.  No mass effect or hemorrhage. 2.  No other acute intracranial abnormality. 3.  MRA findings below.  MRA HEAD WITHOUT CONTRAST  Technique: Angiographic images of the Circle of Willis were obtained using MRA technique without  intravenous contrast.  Findings: Interval loss of antegrade flow signal in the distal right vertebral artery.  Antegrade signal seen in the distal cervical right vertebral segment and near the vertebrobasilar junction.  The MRI flow void with preserved, I favor this represents artifact or reversed flow direction rather than stenosis or occlusion.  Antegrade flow in the distal left vertebral artery. Vertebrobasilar junction is patent.  Basilar arteries pain without stenosis.  SCA and PCA origins are patent.  Bilateral PCA branches are stable and within normal limits.  Posterior communicating arteries are diminutive or absent.  Antegrade flow signal in both ICA siphons.  Chronic ICA tortuosity. No ICA stenosis.  Carotid termini remain within normal limits.  MCA and ACA origins remain within normal limits.  Visualized bilateral MCA and ACA branches are stable within normal limits.  IMPRESSION: 1.  Stable and negative anterior circulation except for chronic ICA tortuosity. 2.  Stable and negative posterior circulation except for loss of antegrade flow in the distal right vertebral artery today.  I favor this is either artifact or reflective of retrograde flow, rather than related to stenosis or occlusion.  Original Report Authenticated By: Harley Hallmark, M.D.   Dg Chest Port 1 View  06/20/2012  *RADIOLOGY REPORT*  Clinical Data: Code stroke.  PORTABLE CHEST - 1 VIEW  Comparison: 02/22/2012  Findings: Shallow inspiration.  Cardiac enlargement with normal pulmonary vascularity for technique.  Probable infiltration or atelectasis in both lung bases.  No pneumothorax.   No blunting of costophrenic angles.  Calcified granuloma in the right midlung. Calcification and torsion of the aorta.  Degenerative changes in the spine and shoulders.  No significant change since previous study.  IMPRESSION: Cardiac enlargement with probable infiltration or atelectasis in the lung bases.  Original Report Authenticated By: Marlon Pel, M.D.    Medications: Scheduled Meds:    . acetaminophen  500 mg Oral BID  . aspirin EC  325 mg Oral Daily  . atorvastatin  10 mg Oral q1800  . azithromycin  500 mg Intravenous Q24H  . bisacodyl  10 mg Rectal Once  . cefTRIAXone (ROCEPHIN)  IV  1 g Intravenous Q24H  . enoxaparin  40 mg Subcutaneous Q24H  . feeding supplement  237 mL Oral BID BM  . feeding supplement  30 mL Oral BID WC  . ferrous fumarate  1 tablet Oral Daily  . hydrALAZINE  25 mg Oral Q8H  .  levothyroxine  25 mcg Oral Q0600  . loratadine  10 mg Oral Daily  . memantine  10 mg Oral QPM  . polyethylene glycol  17 g Oral Daily  . potassium chloride  40 mEq Oral Once  . Tamsulosin HCl  0.4 mg Oral Daily  . vitamin B-12  5,000 mcg Oral Daily  . DISCONTD: aspirin EC  81 mg Oral Daily   Continuous Infusions:    . sodium chloride 50 mL/hr at 06/21/12 2204   PRN Meds:.ondansetron (ZOFRAN) IV    LOS: 2 days   Lory Galan,CHRISTOPHER  [facility] Pager [phone]. If 8PM-8AM, please contact night-coverage at [email or web address], password [facility] 06/22/2012, 10:47 AM  LOS: 2 days

## 2012-06-22 NOTE — Plan of Care (Signed)
Problem: Consults Goal: Stroke - Ischemic/TIA Patient Education See Patient Education Module for education specifics. Outcome: Not Met (add Reason) Pt has severe dementia, unable to receive stroke education.

## 2012-06-22 NOTE — Progress Notes (Signed)
Occupational Therapy Evaluation Patient Details Name: Ricky [name] MRN: [id number] DOB: Apr 28, 1917 Today's Date: 06/22/2012 Time: 4098-1191 OT Time Calculation (min): 35 min  OT Assessment / Plan / Recommendation Clinical Impression  78 y.o. pt. s/p PNA with decr mobility secondary to decr balance and decr endurance.  Will benefit from OT to maximize indpendence and safety with ADLs to decrease burden of care. OT to follow acutely.     OT Assessment  Patient needs continued OT Services    Follow Up Recommendations  Home health OT    Barriers to Discharge None    Equipment Recommendations  None recommended by OT    Recommendations for Other Services    Frequency  Min 2X/week    Precautions / Restrictions Precautions Precautions: Fall Restrictions Weight Bearing Restrictions: No   Pertinent Vitals/Pain No pain reported.      ADL  Eating/Feeding: Performed;Maximal assistance Where Assessed - Eating/Feeding: Bed level Toilet Transfer: Simulated;+2 Total assistance Toilet Transfer: Patient Percentage: 60% Toilet Transfer Method: Sit to stand Toilet Transfer Equipment: Comfort height toilet Equipment Used: Gait belt;Rolling walker ADL Comments: Upon entering the room, pt. was asking for a nurse to feed him. OT assisted pt. with feeding using hand over hand technique. Pt. had decreased initiation and would terminate the movement to bring fork to mouth without encouragement and tactile cues. While standing to transfer to chair from bed, pt. had BM. OT and tech cleaned pt. and he was able to transfer to chair with +2 Total A (60%).     OT Diagnosis: Generalized weakness  OT Problem List: Decreased strength;Decreased activity tolerance;Impaired balance (sitting and/or standing) OT Treatment Interventions: Self-care/ADL training;DME and/or AE instruction;Therapeutic activities;Patient/family education;Balance training;Cognitive remediation/compensation   OT Goals Acute Rehab OT  Goals OT Goal Formulation: With patient Time For Goal Achievement: 07/06/12 Potential to Achieve Goals: Fair ADL Goals Pt Will Perform Eating: with min assist;Sitting, chair ADL Goal: Eating - Progress: Goal set today Pt Will Perform Grooming: with mod assist;Sitting, chair ADL Goal: Grooming - Progress: Goal set today Arm Goals Pt Will Perform AROM: Bilateral upper extremities;2 sets;10 reps;with supervision, verbal cues required/provided Arm Goal: AROM - Progress: Goal set today  Visit Information  Last OT Received On: 06/22/12 Assistance Needed: +2    Subjective Data  Patient Stated Goal: Pt. repeatedly stated, "Where is my nurse?"   Prior Functioning  Vision/Perception  Home Living Lives With: Son;Daughter Available Help at Discharge: Family;Personal care attendant;Available 24 hours/day Type of Home: House Home Access: Ramped entrance Home Layout: One level Bathroom Shower/Tub: Engineer, manufacturing systems: Standard Home Adaptive Equipment: Shower chair without back;Walker - rolling;Bedside commode/3-in-1;Wheelchair - manual Additional Comments: Patient reports using RW in home.  Nursing reports that family states patient has not been ambulating distances since April and just transferring bed to chair.   Prior Function Level of Independence: Needs assistance Needs Assistance: Bathing;Dressing;Feeding;Grooming;Toileting;Meal Prep;Light Housekeeping;Gait;Transfers Bath: Total Dressing: Total Feeding: Minimal Grooming: Moderate Toileting: Total Meal Prep: Total Light Housekeeping: Total Gait Assistance: Ambulated short distance only with RW with mod assist Transfer Assistance: Transferred stand pivot with RW with mod assist. Able to Take Stairs?: No Driving: No Vocation: Retired Musician: Expressive difficulties      Cognition  Overall Cognitive Status: History of cognitive impairments - at baseline Area of Impairment:  Memory;Safety/judgement;Following commands Arousal/Alertness: Awake/alert Orientation Level: Disoriented to;Place Behavior During Session: [facility] for tasks performed Following Commands: Follows one step commands inconsistently Safety/Judgement: Decreased safety judgement for tasks assessed    Extremity/Trunk Assessment  Right Upper Extremity Assessment RUE ROM/Strength/Tone: [facility] for tasks assessed Left Upper Extremity Assessment LUE ROM/Strength/Tone: WFL for tasks assessed   Mobility Bed Mobility Bed Mobility: Sitting - Scoot to Edge of Bed;Supine to Sit Supine to Sit: 3: Mod assist;HOB elevated Sitting - Scoot to Edge of Bed: 3: Mod assist Details for Bed Mobility Assistance: assist to lift trunk and scoot to EOB. Vc's for sequencing Transfers Transfers: Sit to Stand;Stand to Sit Sit to Stand: 1: +2 Total assist;From elevated surface;From bed Sit to Stand: Patient Percentage: 60% Stand to Sit: 1: +2 Total assist;To chair/3-in-1 Stand to Sit: Patient Percentage: 60%           End of Session OT - End of Session Activity Tolerance: Patient tolerated treatment well Patient left: in chair;with call bell/phone within reach Nurse Communication: Mobility status  GO     Jenell Milliner 06/22/2012, 11:54 AM

## 2012-06-23 LAB — BASIC METABOLIC PANEL
Chloride: 105 mEq/L (ref 96–112)
GFR calc Af Amer: 89 mL/min — ABNORMAL LOW (ref >90)
GFR calc non Af Amer: 77 mL/min — ABNORMAL LOW (ref >90)
Potassium: 3.7 mEq/L (ref 3.5–5.1)
Sodium: 140 mEq/L (ref 135–145)

## 2012-06-23 LAB — CBC
HCT: 31.8 % — ABNORMAL LOW (ref 39.0–52.0)
MCHC: 32.1 g/dL (ref 30.0–36.0)
Platelets: 137 10*3/uL — ABNORMAL LOW (ref 150–400)
RDW: 15.4 % (ref 11.5–15.5)
WBC: 4.3 10*3/uL (ref 4.0–10.5)

## 2012-06-23 LAB — GLUCOSE, CAPILLARY
Glucose-Capillary: 87 mg/dL (ref 70–99)
Glucose-Capillary: 102 mg/dL — ABNORMAL HIGH (ref 70–99)

## 2012-06-23 MED ORDER — LEVOFLOXACIN 750 MG PO TABS
750.0000 mg | ORAL_TABLET | Freq: Every day | ORAL | Status: DC
  Administered 2012-06-23 – 2012-06-24 (×2): 750 mg via ORAL
  Filled 2012-06-23 (×2): qty 1

## 2012-06-23 NOTE — Progress Notes (Signed)
[facility] PROGRESS NOTE  Ricky [name] ZOX:[id number] DOB: 1916/12/18 DOA: 06/20/2012 PCP: Carollee Herter, MD  Assessment/Plan: Principal Problem:  *Community acquired pneumonia Active Problems:  HYPERTENSION, UNSPECIFIED  Alzheimer disease  Parkinsonian syndrome  Left-sided weakness  CVA (cerebral infarction)  1. Pneumonia: Patient presented with altered mental status and CXR findings suggestive of pneumonia. This is community acquired, and patient is being managed with Rocephin/Azithro, now day# 3- change to PO levaquin.   He is afebrile at this time, and wbc is normal.   2. Left-sided weakness/Right CVA: This was part of patient's presenting complaints. Neurology consultation was provided by Dr Minus Breeding, and as patient presented outside the appropriate window, t-PA was not administered. Head CT scan was devoid of acute intracranial abnormalities. Patient is on neuro-checks, but has no discernible left -sided weakness at this time, or facial asymmetry. Brain MRI showed acute lacunar infarct in the right cerebral white matter. MRA showed stable and negative anterior circulation except for chronic ICA tortuosity. Patient was already on ASA 81 mg, pre-admission. We have increased this to 325 mg daily, for secondary prevention. SLP evaluation has been recommended, particularly, in view of pneumonia. Per Dr Marjory Lies, no further CVA work up is indicated, as patient is not a candidate for either anticoagulation or vascular surgery.   3. Dyslipidemia: Lipid profile revealed TC 176, TG 56, HDL 63 and LDL 102. Statin has been commenced.   4. History of Left foot stage IV decubitus ulcer: Managing with local care. Managing per wound care team recommendations. Local care and appropriate mattress overlay/Heel lift. (has wound appointment tomm)  5. History of Parkinson's and dementia: Stable on pre-admission medications.   6. Hypertension: Appears sub-optimally controlled at this  time, but reasonable in context of acute CVA. Will continue pre-admission anti-hypertensives, and observe.   7. Acute urinary retention: On 06/21/12 AM , patient was unable to pass urine, and bladder scan revealed > 1000 mls, residual. Managed with Foley catheterization and U/A Is negative. Flomax was started on 06/21/12, as prostate problems are very likely, in this age group. Voiding trial successful  Code Status: full Family Communication: daughter and son via phone Disposition Plan: home tomm- has 24 hour care givers    HPI/Subjective: Feeling better Able to void on own   Objective: Filed Vitals:   06/23/12 0000 06/23/12 0400 06/23/12 0800 06/23/12 0812  BP: 154/83 159/84 166/92 155/72  Pulse: 71 65 65   Temp: 98.4 F (36.9 C) 98.2 F (36.8 C) 98.1 F (36.7 C)   TempSrc:      Resp: 18 18 17    Height:      Weight:      SpO2: 97% 96% 97%     Intake/Output Summary (Last 24 hours) at 06/23/12 1324 Last data filed at 06/23/12 0950  Gross per 24 hour  Intake   1910 ml  Output   2650 ml  Net   -740 ml    Exam:   General:  Pleasant and cooperative, NAD  Cardiovascular: rrr  Respiratory: clear, no wheezing  Abdomen: +BS, soft   Skin: no rashes or lesions    Data Reviewed: Basic Metabolic Panel:  Lab 06/23/12 8119 06/22/12 0507 06/21/12 0335 06/20/12 2050  NA 140 140 141 136  K 3.7 3.3* 3.7 3.8  CL 105 105 105 100  CO2 25 26 23 23   GLUCOSE 80 91 90 117*  BUN 9 10 14 16   CREATININE 0.76 0.82 0.87 1.01  CALCIUM 8.7 8.6 9.3 9.6  MG -- -- -- --  PHOS -- -- -- --   Liver Function Tests:  Lab 06/21/12 0335 06/20/12 2050  AST 21 22  ALT 9 10  ALKPHOS 69 74  BILITOT 0.4 0.3  PROT 7.5 7.8  ALBUMIN 3.3* 3.4*   No results found for this basename: LIPASE:5,AMYLASE:5 in the last 168 hours No results found for this basename: AMMONIA:5 in the last 168 hours CBC:  Lab 06/23/12 0504 06/22/12 0507 06/21/12 0335 06/20/12 2050  WBC 4.3 4.6 5.3 5.6  NEUTROABS --  -- 2.6 3.4  HGB 10.2* 10.4* 10.6* 10.6*  HCT 31.8* 31.2* 33.4* 33.0*  MCV 85.0 84.8 84.3 84.8  PLT 137* 134* 149* 149*   Cardiac Enzymes:  Lab 06/21/12 0312 06/20/12 2050  CKTOTAL 59 60  CKMB 2.5 2.5  CKMBINDEX -- --  TROPONINI <0.30 <0.30   BNP (last 3 results) No results found for this basename: PROBNP:3 in the last 8760 hours CBG:  Lab 06/23/12 1144 06/23/12 0735 06/22/12 2040 06/22/12 1630 06/22/12 1150  GLUCAP 102* 87 92 75 105*    Recent Results (from the past 240 hour(s))  CULTURE, BLOOD (ROUTINE X 2)     Status: Normal (Preliminary result)   Collection Time   06/20/12  9:40 PM      Component Value Range Status Comment   Specimen Description BLOOD RIGHT HAND   Final    Special Requests BOTTLES DRAWN AEROBIC AND ANAEROBIC 10CC EA   Final    Culture  Setup Time 06/21/2012 02:52   Final    Culture     Final    Value:        BLOOD CULTURE RECEIVED NO GROWTH TO DATE CULTURE WILL BE HELD FOR 5 DAYS BEFORE ISSUING A FINAL NEGATIVE REPORT   Report Status PENDING   Incomplete   CULTURE, BLOOD (ROUTINE X 2)     Status: Normal (Preliminary result)   Collection Time   06/20/12  9:50 PM      Component Value Range Status Comment   Specimen Description BLOOD RIGHT HAND   Final    Special Requests BOTTLES DRAWN AEROBIC AND ANAEROBIC 10CC EA   Final    Culture  Setup Time 06/21/2012 02:52   Final    Culture     Final    Value:        BLOOD CULTURE RECEIVED NO GROWTH TO DATE CULTURE WILL BE HELD FOR 5 DAYS BEFORE ISSUING A FINAL NEGATIVE REPORT   Report Status PENDING   Incomplete   URINE CULTURE     Status: Normal   Collection Time   06/21/12  9:29 AM      Component Value Range Status Comment   Specimen Description URINE, CATHETERIZED   Final    Special Requests NONE   Final    Culture  Setup Time 06/21/2012 10:23   Final    Colony Count NO GROWTH   Final    Culture NO GROWTH   Final    Report Status 06/22/2012 FINAL   Final   MRSA PCR SCREENING     Status: Normal   Collection  Time   06/21/12 11:25 AM      Component Value Range Status Comment   MRSA by PCR NEGATIVE  NEGATIVE Final      Studies: Ct Head Wo Contrast  06/21/2012  *RADIOLOGY REPORT*  Clinical Data: 78 year old male with left side weakness.  CT HEAD WITHOUT CONTRAST  Technique:  Contiguous axial images were obtained from  the base of the skull through the vertex without contrast.  Comparison: Head CTs 06/20/2012 and earlier.  Findings: Limited due to difficult patient positioning and kyphosis.  Quasi coronal images are produced.  No acute osseous abnormality identified.  Chronic sphenoid sinus disease.  Other Visualized paranasal sinuses and mastoids are clear.  Visualized orbits and scalp soft tissues are within normal limits.  No ventriculomegaly. No midline shift, mass effect, or evidence of mass lesion.  No acute intracranial hemorrhage identified.  Grossly stable gray-white matter differentiation throughout the brain including left occipital periventricular hypodensity. No evidence of cortically based acute infarction identified.  IMPRESSION: Limited by difficult patient positioning, kyphosis.  No gross acute intracranial abnormality.  Original Report Authenticated By: Harley Hallmark, M.D.   Ct Head Wo Contrast  06/20/2012  *RADIOLOGY REPORT*  Clinical Data: .  Confusion.  Weakness.  CT HEAD WITHOUT CONTRAST  Technique:  Contiguous axial images were obtained from the base of the skull through the vertex without contrast.  Comparison: None.  Findings: The patient is markedly kyphotic and the patient was unable to be scanned in the normal supine position. Atrophy and chronic ischemic white matter disease is present.  There is no gross acute intracranial abnormality identified.  Atherosclerosis is present.  Right sphenoid sinus disease present.  Mastoid air cells clear.  IMPRESSION: No gross acute intracranial abnormality.  Atrophy and chronic ischemic white matter disease.  Original Report Authenticated By: Andreas Newport, M.D.   Mr Maxine Glenn Head Wo Contrast  06/21/2012  *RADIOLOGY REPORT*  Clinical Data:  78 year old male with weakness, confusion.  Comparison: Head CTs 06/21/2012 and earlier.  Brain MRI and MRA 12/17/2009.  MRI HEAD WITHOUT CONTRAST  Technique: Multiplanar, multiecho pulse sequences of the brain and surrounding structures were obtained according to standard protocol without intravenous contrast.  Findings: Punctate restricted diffusion in the right corona radiata (series 9 image 19).  No other definite restricted diffusion in the brain.  No associated mass effect or hemorrhage.  Cerebral volume loss.  No ventriculomegaly. No midline shift, mass effect, or evidence of mass lesion.  Chronic periventricular white matter T2 and FLAIR hyperintensity is not significantly changed since 2011. Major intracranial vascular flow voids are stable. Negative pituitary.  Stable cervicomedullary junction with degenerative ligamentous hypertrophy about the odontoid.  Grossly stable visualized cervical spine.  Normal bone marrow signal. Postoperative changes to the globes. Sphenoid sinus opacification. Other visualized paranasal sinuses and mastoids are clear.  IMPRESSION: 1.  Acute lacunar infarct in the right cerebral white matter.  No mass effect or hemorrhage. 2.  No other acute intracranial abnormality. 3.  MRA findings below.  MRA HEAD WITHOUT CONTRAST  Technique: Angiographic images of the Circle of Willis were obtained using MRA technique without  intravenous contrast.  Findings: Interval loss of antegrade flow signal in the distal right vertebral artery.  Antegrade signal seen in the distal cervical right vertebral segment and near the vertebrobasilar junction.  The MRI flow void with preserved, I favor this represents artifact or reversed flow direction rather than stenosis or occlusion.  Antegrade flow in the distal left vertebral artery. Vertebrobasilar junction is patent.  Basilar arteries pain without stenosis.  SCA  and PCA origins are patent.  Bilateral PCA branches are stable and within normal limits.  Posterior communicating arteries are diminutive or absent.  Antegrade flow signal in both ICA siphons.  Chronic ICA tortuosity. No ICA stenosis.  Carotid termini remain within normal limits.  MCA and ACA origins remain within normal  limits.  Visualized bilateral MCA and ACA branches are stable within normal limits.  IMPRESSION: 1.  Stable and negative anterior circulation except for chronic ICA tortuosity. 2.  Stable and negative posterior circulation except for loss of antegrade flow in the distal right vertebral artery today.  I favor this is either artifact or reflective of retrograde flow, rather than related to stenosis or occlusion.  Original Report Authenticated By: Harley Hallmark, M.D.   Mr Brain Wo Contrast  06/21/2012  *RADIOLOGY REPORT*  Clinical Data:  78 year old male with weakness, confusion.  Comparison: Head CTs 06/21/2012 and earlier.  Brain MRI and MRA 12/17/2009.  MRI HEAD WITHOUT CONTRAST  Technique: Multiplanar, multiecho pulse sequences of the brain and surrounding structures were obtained according to standard protocol without intravenous contrast.  Findings: Punctate restricted diffusion in the right corona radiata (series 9 image 19).  No other definite restricted diffusion in the brain.  No associated mass effect or hemorrhage.  Cerebral volume loss.  No ventriculomegaly. No midline shift, mass effect, or evidence of mass lesion.  Chronic periventricular white matter T2 and FLAIR hyperintensity is not significantly changed since 2011. Major intracranial vascular flow voids are stable. Negative pituitary.  Stable cervicomedullary junction with degenerative ligamentous hypertrophy about the odontoid.  Grossly stable visualized cervical spine.  Normal bone marrow signal. Postoperative changes to the globes. Sphenoid sinus opacification. Other visualized paranasal sinuses and mastoids are clear.   IMPRESSION: 1.  Acute lacunar infarct in the right cerebral white matter.  No mass effect or hemorrhage. 2.  No other acute intracranial abnormality. 3.  MRA findings below.  MRA HEAD WITHOUT CONTRAST  Technique: Angiographic images of the Circle of Willis were obtained using MRA technique without  intravenous contrast.  Findings: Interval loss of antegrade flow signal in the distal right vertebral artery.  Antegrade signal seen in the distal cervical right vertebral segment and near the vertebrobasilar junction.  The MRI flow void with preserved, I favor this represents artifact or reversed flow direction rather than stenosis or occlusion.  Antegrade flow in the distal left vertebral artery. Vertebrobasilar junction is patent.  Basilar arteries pain without stenosis.  SCA and PCA origins are patent.  Bilateral PCA branches are stable and within normal limits.  Posterior communicating arteries are diminutive or absent.  Antegrade flow signal in both ICA siphons.  Chronic ICA tortuosity. No ICA stenosis.  Carotid termini remain within normal limits.  MCA and ACA origins remain within normal limits.  Visualized bilateral MCA and ACA branches are stable within normal limits.  IMPRESSION: 1.  Stable and negative anterior circulation except for chronic ICA tortuosity. 2.  Stable and negative posterior circulation except for loss of antegrade flow in the distal right vertebral artery today.  I favor this is either artifact or reflective of retrograde flow, rather than related to stenosis or occlusion.  Original Report Authenticated By: Harley Hallmark, M.D.   Dg Chest Port 1 View  06/20/2012  *RADIOLOGY REPORT*  Clinical Data: Code stroke.  PORTABLE CHEST - 1 VIEW  Comparison: 02/22/2012  Findings: Shallow inspiration.  Cardiac enlargement with normal pulmonary vascularity for technique.  Probable infiltration or atelectasis in both lung bases.  No pneumothorax.  No blunting of costophrenic angles.  Calcified granuloma  in the right midlung. Calcification and torsion of the aorta.  Degenerative changes in the spine and shoulders.  No significant change since previous study.  IMPRESSION: Cardiac enlargement with probable infiltration or atelectasis in the lung bases.  Original Report  Authenticated By: Marlon Pel, M.D.   Dg Swallowing Func-no Report  06/22/2012  CLINICAL DATA: CVA/Dysphagia.   FLUOROSCOPY FOR SWALLOWING FUNCTION STUDY:  Fluoroscopy was provided for swallowing function study, which was  administered by a speech pathologist.  Final results and recommendations  from this study are contained within the speech pathology report.      Scheduled Meds:   . acetaminophen  500 mg Oral BID  . antiseptic oral rinse  15 mL Mouth Rinse BID  . aspirin EC  325 mg Oral Daily  . atorvastatin  10 mg Oral q1800  . enoxaparin  40 mg Subcutaneous Q24H  . feeding supplement  237 mL Oral BID BM  . feeding supplement  30 mL Oral BID WC  . ferrous fumarate  1 tablet Oral Daily  . hydrALAZINE  25 mg Oral Q8H  . levofloxacin  750 mg Oral Daily  . levothyroxine  25 mcg Oral Q0600  . loratadine  10 mg Oral Daily  . memantine  10 mg Oral QPM  . polyethylene glycol  17 g Oral Daily  . Tamsulosin HCl  0.4 mg Oral Daily  . vitamin B-12  5,000 mcg Oral Daily  . DISCONTD: azithromycin  500 mg Intravenous Q24H  . DISCONTD: cefTRIAXone (ROCEPHIN)  IV  1 g Intravenous Q24H   Continuous Infusions:   . DISCONTD: sodium chloride 50 mL/hr at 06/21/12 2204    Principal Problem:  *Community acquired pneumonia Active Problems:  HYPERTENSION, UNSPECIFIED  Alzheimer disease  Parkinsonian syndrome  Left-sided weakness  CVA (cerebral infarction)    Time spent: 35    Marlin Canary  [facility] Pager [phone]. 06/23/2012, 1:24 PM  LOS: 3 days

## 2012-06-23 NOTE — Progress Notes (Signed)
Physical Therapy Treatment Patient Details Name: Ricky [name] MRN: [id number] DOB: Sep 03, 1917 Today's Date: 06/23/2012 Time: 5784-6962 PT Time Calculation (min): 19 min  PT Assessment / Plan / Recommendation Comments on Treatment Session  Pt moving well once in upright position, although requires increased assistance during bed mobility and simple transfers. Cueing throughout for safety. Continue per plan    Follow Up Recommendations  Home health PT;Supervision/Assistance - 24 hour    Barriers to Discharge        Equipment Recommendations  None recommended by OT    Recommendations for Other Services    Frequency Min 3X/week   Plan Discharge plan remains appropriate;Frequency remains appropriate    Precautions / Restrictions Precautions Precautions: Fall Restrictions Weight Bearing Restrictions: No       Mobility  Bed Mobility Bed Mobility: Supine to Sit;Sitting - Scoot to Edge of Bed Supine to Sit: 3: Mod assist;HOB elevated Sitting - Scoot to Edge of Bed: 3: Mod assist Details for Bed Mobility Assistance: Assist through trunk with cueing for anterior translation throughout transfer as pt posteriorly leans. VC for proper hand placement and sequencing Transfers Transfers: Sit to Stand;Stand to Sit;Stand Pivot Transfers Sit to Stand: 1: +2 Total assist;From elevated surface;From bed Sit to Stand: Patient Percentage: 60% Stand to Sit: 1: +2 Total assist;To chair/3-in-1 Stand to Sit: Patient Percentage: 60% Stand Pivot Transfers: 1: +2 Total assist;With armrests Stand Pivot Transfers: Patient Percentage: 70% Details for Transfer Assistance: VC for hand placement upon standing as well as anterior translation throughout entire transfer as pt heavily in posterior lean. Increased assistance through pelvis for full extension. Once in full upright position, pt able to transfer from bed to chair with RW well, cueing for sequencing.  Ambulation/Gait Ambulation/Gait Assistance: Not  tested (comment)    Exercises     PT Diagnosis:    PT Problem List:   PT Treatment Interventions:     PT Goals Acute Rehab PT Goals PT Goal: Supine/Side to Sit - Progress: Progressing toward goal PT Goal: Sit at Edge Of Bed - Progress: Progressing toward goal PT Transfer Goal: Bed to Chair/Chair to Bed - Progress: Progressing toward goal PT Goal: Ambulate - Progress: Progressing toward goal  Visit Information  Last PT Received On: 06/23/12 Assistance Needed: +2    Subjective Data      Cognition  Overall Cognitive Status: History of cognitive impairments - at baseline Area of Impairment: Memory;Safety/judgement;Following commands Arousal/Alertness: Awake/alert Orientation Level: Disoriented to;Place;Situation Behavior During Session: [facility] for tasks performed Following Commands: Follows one step commands inconsistently Safety/Judgement: Decreased safety judgement for tasks assessed    Balance     End of Session PT - End of Session Equipment Utilized During Treatment: Gait belt Activity Tolerance: Patient tolerated treatment well Patient left: in chair;with call bell/phone within reach;with chair alarm set Nurse Communication: Mobility status     Milana Kidney 06/23/2012, 1:28 PM  06/23/2012 Milana Kidney DPT PAGER: [phone] OFFICE: [phone]

## 2012-06-23 NOTE — Progress Notes (Signed)
Pt had voided at 1130. Pt was able to communicate that he was about to void.  Thanks Ancil Linsey RN

## 2012-06-23 NOTE — Progress Notes (Signed)
Speech Language Pathology Dysphagia Treatment Patient Details Name: PAXTYN BOYAR MRN: [id number] DOB: May 28, 1917 Today's Date: 06/23/2012 Time: 1035-1050 SLP Time Calculation (min): 15 min  Assessment / Plan / Recommendation Clinical Impression  Swallowing function today appears consistent with results of MBS 8/6. Patient able to self feed clinician provided po trials with no overt s/s of aspiration, max verbal and tactile (touch to throat) cues for intermittent dry swallows to clear potential esophageal backflow of bolus. Education complete regarding aspiration precautions, compensatory strateiges, and diet recommendations to son and daughter via phone. Both verbalized understanding. Pt with plans to d/c home with son today. No further SLP needs indicated at this time. Signing off.     Diet Recommendation  Continue with Current Diet: Dysphagia 3 (mechanical soft);Thin liquid    SLP Plan All goals met   Pertinent Vitals/Pain n/a   Swallowing Goals  SLP Swallowing Goals Patient will consume recommended diet without observed clinical signs of aspiration with: Maximum assistance Swallow Study Goal #1 - Progress: Met Patient will utilize recommended strategies during swallow to increase swallowing safety with: Maximum assistance Swallow Study Goal #2 - Progress: Met  General Temperature Spikes Noted: No Respiratory Status: Room air Behavior/Cognition: Alert;Cooperative;Pleasant mood;Requires cueing;Decreased sustained attention Oral Cavity - Dentition: Dentures, top;Dentures, bottom Patient Positioning: Upright in bed   Dysphagia Treatment Treatment focused on: Skilled observation of diet tolerance;Patient/family/caregiver education;Utilization of compensatory strategies Family/Caregiver Educated: son and daughter via phone Treatment Methods/Modalities: Skilled observation Patient observed directly with PO's: Yes Type of PO's observed: Thin liquids;Dysphagia 3 (soft) Feeding: Able  to feed self Liquids provided via: Straw Pharyngeal Phase Signs & Symptoms: Other (comment) (audible swallow) Type of cueing: Verbal;Tactile Amount of cueing: Maximal   GO    Ferdinand Lango MA, CCC-SLP [phone]  Ovida Delagarza Meryl 06/23/2012, 10:59 AM

## 2012-06-23 NOTE — Progress Notes (Signed)
Foley catheter taken out per MD order. Will continue to  Monitor.  Hanks Ancil Linsey RN

## 2012-06-24 DIAGNOSIS — R1310 Dysphagia, unspecified: Secondary | ICD-10-CM

## 2012-06-24 DIAGNOSIS — K5900 Constipation, unspecified: Secondary | ICD-10-CM

## 2012-06-24 LAB — GLUCOSE, CAPILLARY
Glucose-Capillary: 99 mg/dL (ref 70–99)
Glucose-Capillary: 101 mg/dL — ABNORMAL HIGH (ref 70–99)

## 2012-06-24 MED ORDER — ATORVASTATIN CALCIUM 10 MG PO TABS: 10.0000 mg | ORAL_TABLET | Freq: Every day | ORAL | Status: DC

## 2012-06-24 MED ORDER — POLYETHYLENE GLYCOL 3350 17 G PO PACK: 17.0000 g | PACK | Freq: Every day | ORAL | Status: AC

## 2012-06-24 MED ORDER — TAMSULOSIN HCL 0.4 MG PO CAPS: 0.4000 mg | ORAL_CAPSULE | Freq: Every day | ORAL | Status: DC

## 2012-06-24 MED ORDER — LEVOFLOXACIN 750 MG PO TABS: 750.0000 mg | ORAL_TABLET | Freq: Every day | ORAL | Status: DC

## 2012-06-24 NOTE — Discharge Summary (Signed)
Physician Discharge Summary  LUVERN MCISAAC WUJ:[id number] DOB: June 17, 1917 DOA: 06/20/2012  PCP: Carollee Herter, MD  Admit date: 06/20/2012 Discharge date: 06/24/2012  Recommendations for Outpatient Follow-up:  1. Home health for PT/OT, RN 2. 24 hour supervision at home 3. Need BP medications titrated for better control  Discharge Diagnoses:  Principal Problem:  *Community acquired pneumonia Active Problems:  HYPERTENSION, UNSPECIFIED  Alzheimer disease  Parkinsonian syndrome  Left-sided weakness  CVA (cerebral infarction)   Discharge Condition: improved  Diet recommendation: DYS 3 thin liquids  Wt Readings from Last 3 Encounters:  06/21/12 77.202 kg (170 lb 3.2 oz)  02/22/12 78.7 kg (173 lb 8 oz)  02/16/12 77.565 kg (171 lb)    History of present illness:  Most of the history obtained from ER physician, nurse, consult notes from neurologists and previous records as unable to reach family and patient has dementia.  78 year old male with history of Parkinson's disease, dementia who was admitted in April 2013 for vasovagal syncope and UTI was brought to the ER when patient's son noticed patient was having increased confusion and left-sided weakness. Patient was brought as a code stroke. Since patient was outside the TPA window period the code was canceled. Neurologist evaluated the patient. CT head was done which was negative for anything acute. Chest x-ray showed possible infiltrates. Patient has been admitted for further management of his possible pneumonia. As per neurologist at this time we don't feel it's CVA but advised to repeat CT head in the 24 hours and if it shows any features to have further workup for stroke. Patient denies any chest pain, shortness of breath, nausea vomiting or abdominal pain or diarrhea.   Hospital Course:  1. Pneumonia: Patient presented with altered mental status and CXR findings suggestive of pneumonia. This is community acquired,  now day# 4  PO levaquin. He is afebrile at this time, and wbc is normal- aspiration precautions  2. Left-sided weakness/Right CVA: This was part of patient's presenting complaints. Neurology consultation was provided by Dr Minus Breeding, and as patient presented outside the appropriate window, t-PA was not administered. Head CT scan was devoid of acute intracranial abnormalities. Brain MRI showed acute lacunar infarct in the right cerebral white matter. MRA showed stable and negative anterior circulation except for chronic ICA tortuosity. Patient was already on ASA 81 mg, pre-admission. We have increased this to 325 mg daily, for secondary prevention.  Per Dr Marjory Lies, no further CVA work up is indicated, as patient is not a candidate for either anticoagulation or vascular surgery.   3. Dyslipidemia: Lipid profile revealed TC 176, TG 56, HDL 63 and LDL 102. Statin has been commenced.   4. History of Left foot stage IV decubitus ulcer: Managing with local care. Managing per wound care team recommendations. Local care and appropriate mattress overlay/Heel lift. (has wound appointment thurs)   5. History of Parkinson's and dementia: Stable on pre-admission medications.   6. Hypertension: Appears sub-optimally controlled at this time, but reasonable in context of acute CVA. Will continue pre-admission anti-hypertensives, and observe.   7. Acute urinary retention: On 06/21/12 AM , patient was unable to pass urine, and bladder scan revealed > 1000 mls, residual. Managed with Foley catheterization and U/A Is negative. Flomax was started on 06/21/12, as prostate problems are very likely, in this age group. Voiding trial successful- continue flomax   Consultations:  Wound care  Neurology  speech  Discharge Exam: Filed Vitals:   06/24/12 0800  BP: 161/90  Pulse: 73  Temp: 98.2 F (36.8 C)  Resp:    Filed Vitals:   06/23/12 2240 06/24/12 0000 06/24/12 0400 06/24/12 0800  BP: 161/89 156/88 169/92 161/90    Pulse:  71 70 73  Temp:  97.9 F (36.6 C) 97.8 F (36.6 C) 98.2 F (36.8 C)  TempSrc:    Oral  Resp:  20 18   Height:      Weight:      SpO2:  95% 99% 98%    General: pleasant and cooperative Cardiovascular: rrr Respiratory: clear Skin: no rashes or lesions Abd: +BS, firm but non tender  Discharge Instructions  Discharge Orders    Future Appointments: Provider: Department: Dept Phone: Center:   07/01/2012 3:15 PM Pricilla Riffle, MD [facility] [phone] [facility]   07/23/2012 8:00 AM [facility] [phone] [facility]     Future Orders Please Complete By Expires   Diet - low sodium heart healthy      Increase activity slowly      Discharge instructions      Comments:   FLP/LFTs in 6 weeks Home health 24 hour supervision Continue with wound care appointments DYS 3 diet with aspiration precuations     Medication List  As of 06/24/2012  9:41 AM   STOP taking these medications         multivitamin with minerals tablet         TAKE these medications         acetaminophen 500 MG tablet   Commonly known as: TYLENOL   Take 500 mg by mouth 2 (two) times daily.      alendronate 70 MG tablet   Commonly known as: FOSAMAX   Take 70 mg by mouth every 7 (seven) days. Takes on Thursdays. Take with a full glass of water on an empty stomach.  Hold while in hospital      aspirin EC 81 MG tablet   Take 1 tablet (81 mg total) by mouth daily.      atorvastatin 10 MG tablet   Commonly known as: LIPITOR   Take 1 tablet (10 mg total) by mouth daily at 6 PM.      B-12 5000 MCG Subl   Place 1 tablet under the tongue daily.      calcium-vitamin D 500-200 MG-UNIT per tablet   Commonly known as: OSCAL WITH D   Take 1 tablet by mouth 2 (two) times daily.      ferrous fumarate 325 (106 FE) MG Tabs   Commonly known as: HEMOCYTE - 106 mg FE   Take 1 tablet by mouth.      hydrALAZINE 25 MG tablet   Commonly known as: APRESOLINE   Take 1 tablet  (25 mg total) by mouth every 8 (eight) hours.      ICAPS AREDS FORMULA PO   Take 2 capsules by mouth 2 (two) times daily.      levofloxacin 750 MG tablet   Commonly known as: LEVAQUIN   Take 1 tablet (750 mg total) by mouth daily.      levothyroxine 25 MCG tablet   Commonly known as: SYNTHROID, LEVOTHROID   Take 25 mcg by mouth daily.      loratadine 10 MG tablet   Commonly known as: CLARITIN   Take 10 mg by mouth daily.      memantine 10 MG tablet   Commonly known as: NAMENDA   Take 10 mg by mouth every evening.  polyethylene glycol packet   Commonly known as: MIRALAX / GLYCOLAX   Take 17 g by mouth daily.      Tamsulosin HCl 0.4 MG Caps   Commonly known as: FLOMAX   Take 1 capsule (0.4 mg total) by mouth daily.           Follow-up Information    Follow up with Carollee Herter, MD in 2 weeks.   Contact information:   [address] [place] [address] [phone]           The results of significant diagnostics from this hospitalization (including imaging, microbiology, ancillary and laboratory) are listed below for reference.    Significant Diagnostic Studies: Ct Head Wo Contrast  06/21/2012  *RADIOLOGY REPORT*  Clinical Data: 78 year old male with left side weakness.  CT HEAD WITHOUT CONTRAST  Technique:  Contiguous axial images were obtained from the base of the skull through the vertex without contrast.  Comparison: Head CTs 06/20/2012 and earlier.  Findings: Limited due to difficult patient positioning and kyphosis.  Quasi coronal images are produced.  No acute osseous abnormality identified.  Chronic sphenoid sinus disease.  Other Visualized paranasal sinuses and mastoids are clear.  Visualized orbits and scalp soft tissues are within normal limits.  No ventriculomegaly. No midline shift, mass effect, or evidence of mass lesion.  No acute intracranial hemorrhage identified.  Grossly stable gray-white matter differentiation  throughout the brain including left occipital periventricular hypodensity. No evidence of cortically based acute infarction identified.  IMPRESSION: Limited by difficult patient positioning, kyphosis.  No gross acute intracranial abnormality.  Original Report Authenticated By: Harley Hallmark, M.D.   Ct Head Wo Contrast  06/20/2012  *RADIOLOGY REPORT*  Clinical Data: .  Confusion.  Weakness.  CT HEAD WITHOUT CONTRAST  Technique:  Contiguous axial images were obtained from the base of the skull through the vertex without contrast.  Comparison: None.  Findings: The patient is markedly kyphotic and the patient was unable to be scanned in the normal supine position. Atrophy and chronic ischemic white matter disease is present.  There is no gross acute intracranial abnormality identified.  Atherosclerosis is present.  Right sphenoid sinus disease present.  Mastoid air cells clear.  IMPRESSION: No gross acute intracranial abnormality.  Atrophy and chronic ischemic white matter disease.  Original Report Authenticated By: Andreas Newport, M.D.   Mr Maxine Glenn Head Wo Contrast  06/21/2012  *RADIOLOGY REPORT*  Clinical Data:  78 year old male with weakness, confusion.  Comparison: Head CTs 06/21/2012 and earlier.  Brain MRI and MRA 12/17/2009.  MRI HEAD WITHOUT CONTRAST  Technique: Multiplanar, multiecho pulse sequences of the brain and surrounding structures were obtained according to standard protocol without intravenous contrast.  Findings: Punctate restricted diffusion in the right corona radiata (series 9 image 19).  No other definite restricted diffusion in the brain.  No associated mass effect or hemorrhage.  Cerebral volume loss.  No ventriculomegaly. No midline shift, mass effect, or evidence of mass lesion.  Chronic periventricular white matter T2 and FLAIR hyperintensity is not significantly changed since 2011. Major intracranial vascular flow voids are stable. Negative pituitary.  Stable cervicomedullary junction with  degenerative ligamentous hypertrophy about the odontoid.  Grossly stable visualized cervical spine.  Normal bone marrow signal. Postoperative changes to the globes. Sphenoid sinus opacification. Other visualized paranasal sinuses and mastoids are clear.  IMPRESSION: 1.  Acute lacunar infarct in the right cerebral white matter.  No mass effect or hemorrhage. 2.  No other acute intracranial abnormality. 3.  MRA findings below.  MRA HEAD WITHOUT CONTRAST  Technique: Angiographic images of the Circle of Willis were obtained using MRA technique without  intravenous contrast.  Findings: Interval loss of antegrade flow signal in the distal right vertebral artery.  Antegrade signal seen in the distal cervical right vertebral segment and near the vertebrobasilar junction.  The MRI flow void with preserved, I favor this represents artifact or reversed flow direction rather than stenosis or occlusion.  Antegrade flow in the distal left vertebral artery. Vertebrobasilar junction is patent.  Basilar arteries pain without stenosis.  SCA and PCA origins are patent.  Bilateral PCA branches are stable and within normal limits.  Posterior communicating arteries are diminutive or absent.  Antegrade flow signal in both ICA siphons.  Chronic ICA tortuosity. No ICA stenosis.  Carotid termini remain within normal limits.  MCA and ACA origins remain within normal limits.  Visualized bilateral MCA and ACA branches are stable within normal limits.  IMPRESSION: 1.  Stable and negative anterior circulation except for chronic ICA tortuosity. 2.  Stable and negative posterior circulation except for loss of antegrade flow in the distal right vertebral artery today.  I favor this is either artifact or reflective of retrograde flow, rather than related to stenosis or occlusion.  Original Report Authenticated By: Harley Hallmark, M.D.   Mr Brain Wo Contrast  06/21/2012  *RADIOLOGY REPORT*  Clinical Data:  78 year old male with weakness, confusion.   Comparison: Head CTs 06/21/2012 and earlier.  Brain MRI and MRA 12/17/2009.  MRI HEAD WITHOUT CONTRAST  Technique: Multiplanar, multiecho pulse sequences of the brain and surrounding structures were obtained according to standard protocol without intravenous contrast.  Findings: Punctate restricted diffusion in the right corona radiata (series 9 image 19).  No other definite restricted diffusion in the brain.  No associated mass effect or hemorrhage.  Cerebral volume loss.  No ventriculomegaly. No midline shift, mass effect, or evidence of mass lesion.  Chronic periventricular white matter T2 and FLAIR hyperintensity is not significantly changed since 2011. Major intracranial vascular flow voids are stable. Negative pituitary.  Stable cervicomedullary junction with degenerative ligamentous hypertrophy about the odontoid.  Grossly stable visualized cervical spine.  Normal bone marrow signal. Postoperative changes to the globes. Sphenoid sinus opacification. Other visualized paranasal sinuses and mastoids are clear.  IMPRESSION: 1.  Acute lacunar infarct in the right cerebral white matter.  No mass effect or hemorrhage. 2.  No other acute intracranial abnormality. 3.  MRA findings below.  MRA HEAD WITHOUT CONTRAST  Technique: Angiographic images of the Circle of Willis were obtained using MRA technique without  intravenous contrast.  Findings: Interval loss of antegrade flow signal in the distal right vertebral artery.  Antegrade signal seen in the distal cervical right vertebral segment and near the vertebrobasilar junction.  The MRI flow void with preserved, I favor this represents artifact or reversed flow direction rather than stenosis or occlusion.  Antegrade flow in the distal left vertebral artery. Vertebrobasilar junction is patent.  Basilar arteries pain without stenosis.  SCA and PCA origins are patent.  Bilateral PCA branches are stable and within normal limits.  Posterior communicating arteries are  diminutive or absent.  Antegrade flow signal in both ICA siphons.  Chronic ICA tortuosity. No ICA stenosis.  Carotid termini remain within normal limits.  MCA and ACA origins remain within normal limits.  Visualized bilateral MCA and ACA branches are stable within normal limits.  IMPRESSION: 1.  Stable and negative anterior circulation except for chronic  ICA tortuosity. 2.  Stable and negative posterior circulation except for loss of antegrade flow in the distal right vertebral artery today.  I favor this is either artifact or reflective of retrograde flow, rather than related to stenosis or occlusion.  Original Report Authenticated By: Harley Hallmark, M.D.   Dg Chest Port 1 View  06/20/2012  *RADIOLOGY REPORT*  Clinical Data: Code stroke.  PORTABLE CHEST - 1 VIEW  Comparison: 02/22/2012  Findings: Shallow inspiration.  Cardiac enlargement with normal pulmonary vascularity for technique.  Probable infiltration or atelectasis in both lung bases.  No pneumothorax.  No blunting of costophrenic angles.  Calcified granuloma in the right midlung. Calcification and torsion of the aorta.  Degenerative changes in the spine and shoulders.  No significant change since previous study.  IMPRESSION: Cardiac enlargement with probable infiltration or atelectasis in the lung bases.  Original Report Authenticated By: Marlon Pel, M.D.   Dg Swallowing Func-no Report  06/22/2012  CLINICAL DATA: CVA/Dysphagia.   FLUOROSCOPY FOR SWALLOWING FUNCTION STUDY:  Fluoroscopy was provided for swallowing function study, which was  administered by a speech pathologist.  Final results and recommendations  from this study are contained within the speech pathology report.      Microbiology: Recent Results (from the past 240 hour(s))  CULTURE, BLOOD (ROUTINE X 2)     Status: Normal (Preliminary result)   Collection Time   06/20/12  9:40 PM      Component Value Range Status Comment   Specimen Description BLOOD RIGHT HAND   Final     Special Requests BOTTLES DRAWN AEROBIC AND ANAEROBIC 10CC EA   Final    Culture  Setup Time 06/21/2012 02:52   Final    Culture     Final    Value:        BLOOD CULTURE RECEIVED NO GROWTH TO DATE CULTURE WILL BE HELD FOR 5 DAYS BEFORE ISSUING A FINAL NEGATIVE REPORT   Report Status PENDING   Incomplete   CULTURE, BLOOD (ROUTINE X 2)     Status: Normal (Preliminary result)   Collection Time   06/20/12  9:50 PM      Component Value Range Status Comment   Specimen Description BLOOD RIGHT HAND   Final    Special Requests BOTTLES DRAWN AEROBIC AND ANAEROBIC 10CC EA   Final    Culture  Setup Time 06/21/2012 02:52   Final    Culture     Final    Value:        BLOOD CULTURE RECEIVED NO GROWTH TO DATE CULTURE WILL BE HELD FOR 5 DAYS BEFORE ISSUING A FINAL NEGATIVE REPORT   Report Status PENDING   Incomplete   URINE CULTURE     Status: Normal   Collection Time   06/21/12  9:29 AM      Component Value Range Status Comment   Specimen Description URINE, CATHETERIZED   Final    Special Requests NONE   Final    Culture  Setup Time 06/21/2012 10:23   Final    Colony Count NO GROWTH   Final    Culture NO GROWTH   Final    Report Status 06/22/2012 FINAL   Final   MRSA PCR SCREENING     Status: Normal   Collection Time   06/21/12 11:25 AM      Component Value Range Status Comment   MRSA by PCR NEGATIVE  NEGATIVE Final      Labs: Basic Metabolic Panel:  Lab  06/23/12 0504 06/22/12 0507 06/21/12 0335 06/20/12 2050  NA 140 140 141 136  K 3.7 3.3* 3.7 3.8  CL 105 105 105 100  CO2 25 26 23 23   GLUCOSE 80 91 90 117*  BUN 9 10 14 16   CREATININE 0.76 0.82 0.87 1.01  CALCIUM 8.7 8.6 9.3 9.6  MG -- -- -- --  PHOS -- -- -- --   Liver Function Tests:  Lab 06/21/12 0335 06/20/12 2050  AST 21 22  ALT 9 10  ALKPHOS 69 74  BILITOT 0.4 0.3  PROT 7.5 7.8  ALBUMIN 3.3* 3.4*   No results found for this basename: LIPASE:5,AMYLASE:5 in the last 168 hours No results found for this basename: AMMONIA:5 in the  last 168 hours CBC:  Lab 06/23/12 0504 06/22/12 0507 06/21/12 0335 06/20/12 2050  WBC 4.3 4.6 5.3 5.6  NEUTROABS -- -- 2.6 3.4  HGB 10.2* 10.4* 10.6* 10.6*  HCT 31.8* 31.2* 33.4* 33.0*  MCV 85.0 84.8 84.3 84.8  PLT 137* 134* 149* 149*   Cardiac Enzymes:  Lab 06/21/12 0312 06/20/12 2050  CKTOTAL 59 60  CKMB 2.5 2.5  CKMBINDEX -- --  TROPONINI <0.30 <0.30   BNP: BNP (last 3 results) No results found for this basename: PROBNP:3 in the last 8760 hours CBG:  Lab 06/24/12 0725 06/23/12 2103 06/23/12 1702 06/23/12 1144 06/23/12 0735  GLUCAP 99 116* 125* 102* 87    Time coordinating discharge: 45 minutes  Signed:  Benjamine Mola, Rihan Schueler  [facility] 06/24/2012, 9:41 AM

## 2012-06-24 NOTE — Progress Notes (Signed)
   CARE MANAGEMENT NOTE 06/24/2012  Patient:  MAY, OZMENT   Account Number:  [id number]  Date Initiated:  06/22/2012  Documentation initiated by:  GRAVES-BIGELOW,BRENDA  Subjective/Objective Assessment:   Pt admitted with l sided weakness and  treating for PNA. Pt is from home with son. PT is working with pt.     Action/Plan:   CM will continue to monitor for disposition needs.   Anticipated DC Date:  06/25/2012   Anticipated DC Plan:  HOME W HOME HEALTH SERVICES      DC Planning Services  CM consult      [facility]  HOME HEALTH  Resumption Of Svcs/PTA Provider   Choice offered to / List presented to:  C-4 Adult Children        HH arranged  HH-1 RN  HH-2 PT  HH-3 OT      [facility] agency  [facility]   Status of service:  Completed, signed off Medicare Important Message given?   (If response is "NO", the following Medicare IM given date fields will be blank) Date Medicare IM given:   Date Additional Medicare IM given:    Discharge Disposition:  HOME W HOME HEALTH SERVICES  Per UR Regulation:  Reviewed for med. necessity/level of care/duration of stay  If discussed at Long Length of Stay Meetings, dates discussed:    Comments:  Damita Lack  06/24/12- 1015- Donn Pierini RN, BSN [phone] Pt for d/c today, call made to [facility]-  spoke with Martha-confirmed that pt is active with them and services to be resumed per family choice with [facility]. HH orders along with d/c summary faxed to [facility]- [phone]) and per [place] services will resume tomorrow 06/25/12. Family to come to transport pt per private vehicle as per conversation with pt's son and daughter on 06/23/12- pt has appointment at wound center today.   06/23/12- 1200- Donn Pierini RN, BSN [phone] Spoke with pt's son [name], and daughter [name] via [place]- per conversation pt has been active with [facility] and that is whom they want to continue [employer] services with. Pt has all needed DME at home  including w/c, walker, gait belt, bath sheet, bed elevator to 90 degrees. Pt also has a private pay personal CNA. Plan is for pt to return home with son via private vehicle. Will need HH orders for RN/PT/OT-

## 2012-06-24 NOTE — Progress Notes (Signed)
I agree with the following treatment note after reviewing documentation.   Johnston, Arriyah Madej Brynn   OTR/L Pager: [phone] Office: [phone] .   

## 2012-06-24 NOTE — Progress Notes (Signed)
Pt discharged to home per MD order. Pt oriented to person at discharge. Discharge instructions reviewed with pt Son, [name], who is at bedside, including follow-up appointments, medications and when to call the doctor.. Stroke discharge education provided to son.  NIH documented.  Pt escorted to private vehicle via wheelchair.  Efraim Kaufmann

## 2012-06-24 NOTE — Progress Notes (Signed)
Occupational Therapy Treatment Patient Details Name: DWAYNE BULKLEY MRN: [id number] DOB: Apr 03, 1917 Today's Date: 06/24/2012 Time: 4098-1191 OT Time Calculation (min): 34 min  OT Assessment / Plan / Recommendation Comments on Treatment Session Pt. did well and is progressing towards goals.    Follow Up Recommendations  Home health OT    Barriers to Discharge       Equipment Recommendations  None recommended by OT    Recommendations for Other Services    Frequency Min 2X/week   Plan Discharge plan remains appropriate    Precautions / Restrictions Precautions Precautions: Fall Restrictions Weight Bearing Restrictions: No   Pertinent Vitals/Pain No pain reported.     ADL  Eating/Feeding: Performed;Minimal assistance Where Assessed - Eating/Feeding: Bed level ADL Comments: OT assisted pt. with eating his lunch. Pt. did well in session and required Min A to lift drink and vc's to take small bites.       OT Goals Acute Rehab OT Goals OT Goal Formulation: With patient Time For Goal Achievement: 07/06/12 Potential to Achieve Goals: Fair ADL Goals Pt Will Perform Eating: with min assist;Sitting, chair ADL Goal: Eating - Progress: Met Pt Will Perform Grooming: with mod assist;Sitting, chair Arm Goals Pt Will Perform AROM: Bilateral upper extremities;2 sets;10 reps;with supervision, verbal cues required/provided  Visit Information  Last OT Received On: 06/24/12 Assistance Needed: +2    Subjective Data   Pt. Thanked OT numerous times for coming to help him eat his lunch.   Prior Functioning       Cognition  Overall Cognitive Status: Appears within functional limits for tasks assessed/performed Arousal/Alertness: Awake/alert Orientation Level: Appears intact for tasks assessed Behavior During Session: [facility] for tasks performed    Mobility Bed Mobility Bed Mobility: Not assessed Transfers Transfers: Not assessed           End of Session OT - End of  Session Activity Tolerance: Patient tolerated treatment well Patient left: in bed;with call bell/phone within reach;with bed alarm set (Tech in room)  GO     Jenell Milliner 06/24/2012, 2:34 PM

## 2012-06-27 LAB — CULTURE, BLOOD (ROUTINE X 2)
Culture: NO GROWTH
Culture: NO GROWTH

## 2012-07-01 ENCOUNTER — Ambulatory Visit (INDEPENDENT_AMBULATORY_CARE_PROVIDER_SITE_OTHER): Payer: Medicare Other | Admitting: Internal Medicine

## 2012-07-01 ENCOUNTER — Encounter: Payer: Self-pay | Admitting: Internal Medicine

## 2012-07-01 VITALS — BP 133/74 | HR 84 | Ht 72.0 in | Wt 172.0 lb

## 2012-07-01 DIAGNOSIS — R0602 Shortness of breath: Secondary | ICD-10-CM

## 2012-07-01 DIAGNOSIS — R55 Syncope and collapse: Secondary | ICD-10-CM

## 2012-07-01 NOTE — Progress Notes (Signed)
HPI Patient is a 78 year old with a history of syncope and bradycardia and dementia.  I saw him last in clinic in December 2011.  He is also seen by Ileene Rubens in July 2013.  Patient's son reports no recent syncope.  He has been more alert recently.  Son says he does worse when he has infecitons.   Now being treated for foot ulcer.   No Known Allergies  Current Outpatient Prescriptions  Medication Sig Dispense Refill  . acetaminophen (TYLENOL) 500 MG tablet Take 500 mg by mouth 2 (two) times daily.      Marland Kitchen aspirin EC 81 MG tablet Take 1 tablet (81 mg total) by mouth daily.  30 tablet  11  . atorvastatin (LIPITOR) 10 MG tablet Take 1 tablet (10 mg total) by mouth daily at 6 PM.  30 tablet  0  . calcium-vitamin D (OSCAL WITH D) 500-200 MG-UNIT per tablet Take 1 tablet by mouth 2 (two) times daily. Correct dose is 600 mg \\1200  mg oscal citrate      . Cyanocobalamin (B-12) 5000 MCG SUBL Place 1 tablet under the tongue daily.      . ferrous fumarate (HEMOCYTE - 106 MG FE) 325 (106 FE) MG TABS Take 1 tablet by mouth.      . hydrALAZINE (APRESOLINE) 25 MG tablet Take 1 tablet (25 mg total) by mouth every 8 (eight) hours.  90 tablet  0  . levothyroxine (SYNTHROID, LEVOTHROID) 25 MCG tablet Take 25 mcg by mouth daily.      Marland Kitchen loratadine (CLARITIN) 10 MG tablet Take 10 mg by mouth daily. As needed      . memantine (NAMENDA) 10 MG tablet Take 10mg   1 tab twice a day      . Multiple Vitamins-Minerals (ICAPS AREDS FORMULA PO) Take 1 capsule by mouth 2 (two) times daily.       . polyethylene glycol (MIRALAX / GLYCOLAX) packet Take 17 g by mouth daily.      . Tamsulosin HCl (FLOMAX) 0.4 MG CAPS Take 0.4 mg by mouth daily. 1/2 tab after lunch        Past Medical History  Diagnosis Date  . IBS (irritable bowel syndrome)   . Diverticulosis   . Cataract   . OAB (overactive bladder)   . Macular degeneration   . Alzheimer disease   . Hypertension   . Arthritis   . Osteoporosis   . Parkinsonian syndrome   .  Syncope, vasovagal   . Neuromuscular disorder     parkisonian syndrome  . Dementia due to Parkinson's disease without behavioral disturbance     Past Surgical History  Procedure Date  . Esophagogastroduodenoscopy   . Eye surgery     LEFT CATARACT    Family History  Problem Relation Age of Onset  . Cancer Mother   . Stroke Father   . Liver disease Father   . Kidney disease Brother     History   Social History  . Marital Status: Single    Spouse Name: N/A    Number of Children: N/A  . Years of Education: N/A   Occupational History  . Not on file.   Social History Main Topics  . Smoking status: Never Smoker   . Smokeless tobacco: Never Used  . Alcohol Use: No  . Drug Use: No  . Sexually Active: No   Other Topics Concern  . Not on file   Social History Narrative  . No narrative on file  Review of Systems:  All systems reviewed.  They are negative to the above problem except as previously stated.  Vital Signs: BP 122/69  Pulse 72  Ht 6' (1.829 m)  Wt 172 lb (78.019 kg)  BMI 23.33 kg/m2  Physical Exam Patient is in NAD.  IS sleeping some during evaluation HEENT:  Normocephalic, atraumatic. EOMI, PERRLA.  Neck: JVP is normal.  No bruits.  Lungs: clear to auscultation. No rales  Occas rhonchi. Heart: Regular rate and rhythm. Normal S1, S2. No S3.   No significant murmurs. PMI not displaced.  Abdomen:  Supple, nontender. Normal bowel sounds. No masses. No hepatomegaly.  Extremities:   Good distal pulses throughout. Tr. lower extremity edema. L foot in boot with dressing. Musculoskeletal :moving all extremities.  Neuro:   alert and oriented x3.  CN II-XII grossly intact.   Assessment and Plan:  1.  Syncope.  No recent spells.  He is transiently orthostatic on evaluation today.  (BP drops at 0 min but thin increases.  Patient asymptomatic. Appetite is good.    2.  Dementia.  Followed by Ileene Rubens  3.  Bradycardia.  No signif episodes.   No signif  arrhythmia documented.  I think spells in past represent orthostatic intolerance. HR today increased some with standing.   Would follow.  No change in Rx.

## 2012-07-01 NOTE — Patient Instructions (Signed)
Lab work today. Will call you with results. 

## 2012-07-02 LAB — BASIC METABOLIC PANEL
CO2: 25 mEq/L (ref 19–32)
Chloride: 107 mEq/L (ref 96–112)
Potassium: 4.6 mEq/L (ref 3.5–5.1)
Sodium: 140 mEq/L (ref 135–145)

## 2012-07-02 LAB — CBC WITH DIFFERENTIAL/PLATELET
Basophils Relative: 0.3 % (ref 0.0–3.0)
Eosinophils Relative: 2.3 % (ref 0.0–5.0)
HCT: 34 % — ABNORMAL LOW (ref 39.0–52.0)
Hemoglobin: 10.9 g/dL — ABNORMAL LOW (ref 13.0–17.0)
Lymphs Abs: 1.4 10*3/uL (ref 0.7–4.0)
MCV: 87.6 fl (ref 78.0–100.0)
Monocytes Absolute: 0.5 10*3/uL (ref 0.1–1.0)
Monocytes Relative: 9.5 % (ref 3.0–12.0)
RBC: 3.88 Mil/uL — ABNORMAL LOW (ref 4.22–5.81)
WBC: 4.9 10*3/uL (ref 4.5–10.5)

## 2012-07-02 LAB — BRAIN NATRIURETIC PEPTIDE: Pro B Natriuretic peptide (BNP): 118 pg/mL — ABNORMAL HIGH (ref 0.0–100.0)

## 2012-07-23 ENCOUNTER — Encounter (HOSPITAL_BASED_OUTPATIENT_CLINIC_OR_DEPARTMENT_OTHER): Payer: Medicare Other | Attending: General Surgery

## 2012-07-23 DIAGNOSIS — L89609 Pressure ulcer of unspecified heel, unspecified stage: Secondary | ICD-10-CM | POA: Insufficient documentation

## 2012-07-23 DIAGNOSIS — L8992 Pressure ulcer of unspecified site, stage 2: Secondary | ICD-10-CM | POA: Insufficient documentation

## 2012-08-07 ENCOUNTER — Emergency Department (INDEPENDENT_AMBULATORY_CARE_PROVIDER_SITE_OTHER): Payer: Medicare Other

## 2012-08-07 ENCOUNTER — Encounter (HOSPITAL_COMMUNITY): Payer: Self-pay | Admitting: Emergency Medicine

## 2012-08-07 ENCOUNTER — Emergency Department (INDEPENDENT_AMBULATORY_CARE_PROVIDER_SITE_OTHER)
Admission: EM | Admit: 2012-08-07 | Discharge: 2012-08-07 | Disposition: A | Discharge status: Home / Self Care | Payer: Medicare Other | Source: Home / Self Care

## 2012-08-07 DIAGNOSIS — S20219A Contusion of unspecified front wall of thorax, initial encounter: Secondary | ICD-10-CM

## 2012-08-07 NOTE — ED Notes (Signed)
Pt fell on Tuesday on his right side. Pt is having pain/sorness in right rib area. Pt has has tried cold/hot compresses with no relief.

## 2012-08-07 NOTE — ED Provider Notes (Signed)
History     CSN: [id number]  Arrival date & time 08/07/12  4098   None     Chief Complaint  Patient presents with  . Fall    fell on Tuesday    (Consider location/radiation/quality/duration/timing/severity/associated sxs/prior treatment) HPI Comments: This is a debilitated 78 year old gentleman with a history of community-acquired pneumonia, hypertension, CVA with left-sided weakness, osteoporosis, parkinsonian syndrome, neuromuscular disorder, dementia due to Parkinson's disease and Alzheimer's. On Tuesday 4 days ago, he was being assisted with ambulation by a family member and he fell to the ground. Sister "broke" his fall and he landed on his right lateral chest. He had complained of right rib pain since the fall. He has denied complaints of chest pain or shortness of breath but his son said he has had slight increase in cough. Otherwise there've been no changes or worsening in his condition.   Past Medical History  Diagnosis Date  . IBS (irritable bowel syndrome)   . Diverticulosis   . Cataract   . OAB (overactive bladder)   . Macular degeneration   . Alzheimer disease   . Hypertension   . Arthritis   . Osteoporosis   . Parkinsonian syndrome   . Syncope, vasovagal   . Neuromuscular disorder     parkisonian syndrome  . Dementia due to Parkinson's disease without behavioral disturbance     Past Surgical History  Procedure Date  . Esophagogastroduodenoscopy   . Eye surgery     LEFT CATARACT    Family History  Problem Relation Age of Onset  . Cancer Mother   . Stroke Father   . Liver disease Father   . Kidney disease Brother     History  Substance Use Topics  . Smoking status: Never Smoker   . Smokeless tobacco: Never Used  . Alcohol Use: No      Review of Systems  Constitutional: Negative for fever, activity change and fatigue.  HENT: Negative.   Respiratory: Positive for cough. Negative for choking, chest tightness, shortness of breath and wheezing.    Musculoskeletal: Negative for back pain.       As per HPI  Neurological: Positive for facial asymmetry and speech difficulty.       Due to old CVA    Allergies  Review of patient's allergies indicates no known allergies.  Home Medications   Current Outpatient Rx  Name Route Sig Dispense Refill  . ACETAMINOPHEN 500 MG PO TABS Oral Take 500 mg by mouth 2 (two) times daily.    . ASPIRIN EC 81 MG PO TBEC Oral Take 1 tablet (81 mg total) by mouth daily. 30 tablet 11  . ATORVASTATIN CALCIUM 10 MG PO TABS Oral Take 1 tablet (10 mg total) by mouth daily at 6 PM. 30 tablet 0  . CALCIUM CARBONATE-VITAMIN D 500-200 MG-UNIT PO TABS Oral Take 1 tablet by mouth 2 (two) times daily. Correct dose is 600 mg \\1200  mg oscal citrate    . B-12 5000 MCG SL SUBL Sublingual Place 1 tablet under the tongue daily.    Marland Kitchen FERROUS FUMARATE 325 (106 FE) MG PO TABS Oral Take 1 tablet by mouth.    Marland Kitchen LEVOTHYROXINE SODIUM 25 MCG PO TABS Oral Take 25 mcg by mouth daily.    Marland Kitchen LORATADINE 10 MG PO TABS Oral Take 10 mg by mouth daily. As needed    . MEMANTINE HCL 10 MG PO TABS  Take 10mg   1 tab twice a day    . ICAPS AREDS  FORMULA PO Oral Take 1 capsule by mouth 2 (two) times daily.     Marland Kitchen POLYETHYLENE GLYCOL 3350 PO PACK Oral Take 17 g by mouth daily.    Marland Kitchen TAMSULOSIN HCL 0.4 MG PO CAPS Oral Take 0.4 mg by mouth daily. 1/2 tab after lunch    . HYDRALAZINE HCL 25 MG PO TABS Oral Take 1 tablet (25 mg total) by mouth every 8 (eight) hours. 90 tablet 0    BP 175/92  Pulse 70  Temp 98.1 F (36.7 C) (Oral)  Resp 21  SpO2 94%  Physical Exam  Constitutional: He appears well-nourished. No distress.  HENT:  Head: Normocephalic and atraumatic.  Neck: Neck supple.       Some weakness in neck muscles since his CVA. He tends to hang his head down while sitting and range of motion was not tested.  Cardiovascular: Normal rate, regular rhythm and normal heart sounds.   Pulmonary/Chest: Effort normal and breath sounds normal. No  respiratory distress. He has no wheezes. He has no rales.  Abdominal: Soft. There is no tenderness.  Musculoskeletal:       He points to the right lateral chest wall in locating the rib pain. Palpation of the right lateral wall reveals a small area of tenderness at the midaxillary line even with the level of the nipples. No bony movement is appreciated. No crepitus and no overlying skin changes. No swelling.  His son is that he fell on his right elbow as well. He received a small superficial abrasion which is healing well  Neurological: He is alert.  Skin: Skin is warm and dry.    ED Course  Procedures (including critical care time)  Labs Reviewed - No data to display Dg Ribs Unilateral W/chest Right  08/07/2012  *RADIOLOGY REPORT*  Clinical Data: The patient fell 4 days ago.  Right-sided chest wall pain.  RIGHT RIBS AND CHEST - 3+ VIEW  Comparison: 06/20/2012  Findings: The no convincing rib fracture or rib lesion.  The bony thorax is demineralized.  There are coarse reticular opacities in the lungs consistent with scarring, subsegmental atelectasis or a combination.  A small nodule is noted in the right mid lung.  These findings are stable. There are no acute findings in the lungs.  No pleural effusion or pneumothorax.  IMPRESSION: No convincing rib fracture or rib lesion.  No acute cardiopulmonary disease.   Original Report Authenticated By: Domenic Moras, M.D.      1. Rib contusion       MDM  Dg Ribs Unilateral W/chest Right  08/07/2012  *RADIOLOGY REPORT*  Clinical Data: The patient fell 4 days ago.  Right-sided chest wall pain.  RIGHT RIBS AND CHEST - 3+ VIEW  Comparison: 06/20/2012  Findings: The no convincing rib fracture or rib lesion.  The bony thorax is demineralized.  There are coarse reticular opacities in the lungs consistent with scarring, subsegmental atelectasis or a combination.  A small nodule is noted in the right mid lung.  These findings are stable. There are no  acute findings in the lungs.  No pleural effusion or pneumothorax.  IMPRESSION: No convincing rib fracture or rib lesion.  No acute cardiopulmonary disease.   Original Report Authenticated By: Domenic Moras, M.D.      Reassurance. Ice to sore areas, Tylenol for pain as needed.         Hayden Rasmussen, NP 08/07/12 1049

## 2012-08-08 NOTE — ED Provider Notes (Signed)
Medical screening examination/treatment/procedure(s) were performed by non-physician practitioner and as supervising physician I was immediately available for consultation/collaboration.  Leslee Home, M.D.   Reuben Likes, MD 08/08/12 0830

## 2012-08-20 ENCOUNTER — Encounter (HOSPITAL_BASED_OUTPATIENT_CLINIC_OR_DEPARTMENT_OTHER): Payer: Medicare Other | Attending: General Surgery

## 2012-08-20 DIAGNOSIS — L8992 Pressure ulcer of unspecified site, stage 2: Secondary | ICD-10-CM | POA: Insufficient documentation

## 2012-08-20 DIAGNOSIS — L89609 Pressure ulcer of unspecified heel, unspecified stage: Secondary | ICD-10-CM | POA: Insufficient documentation

## 2012-09-07 ENCOUNTER — Telehealth: Payer: Self-pay | Admitting: Internal Medicine

## 2012-09-07 ENCOUNTER — Encounter (HOSPITAL_COMMUNITY): Payer: Self-pay | Admitting: Radiology

## 2012-09-07 ENCOUNTER — Emergency Department (HOSPITAL_COMMUNITY)
Admission: EM | Admit: 2012-09-07 | Discharge: 2012-09-07 | Disposition: A | Discharge status: Home / Self Care | Payer: Medicare Other | Attending: Emergency Medicine | Admitting: Emergency Medicine

## 2012-09-07 ENCOUNTER — Emergency Department (HOSPITAL_COMMUNITY): Payer: Medicare Other

## 2012-09-07 DIAGNOSIS — M810 Age-related osteoporosis without current pathological fracture: Secondary | ICD-10-CM | POA: Insufficient documentation

## 2012-09-07 DIAGNOSIS — G20 Parkinson's disease: Secondary | ICD-10-CM | POA: Insufficient documentation

## 2012-09-07 DIAGNOSIS — Z79899 Other long term (current) drug therapy: Secondary | ICD-10-CM | POA: Insufficient documentation

## 2012-09-07 DIAGNOSIS — K5730 Diverticulosis of large intestine without perforation or abscess without bleeding: Secondary | ICD-10-CM | POA: Insufficient documentation

## 2012-09-07 DIAGNOSIS — M129 Arthropathy, unspecified: Secondary | ICD-10-CM | POA: Insufficient documentation

## 2012-09-07 DIAGNOSIS — F0390 Unspecified dementia without behavioral disturbance: Secondary | ICD-10-CM

## 2012-09-07 DIAGNOSIS — N318 Other neuromuscular dysfunction of bladder: Secondary | ICD-10-CM | POA: Insufficient documentation

## 2012-09-07 DIAGNOSIS — Z7982 Long term (current) use of aspirin: Secondary | ICD-10-CM | POA: Insufficient documentation

## 2012-09-07 DIAGNOSIS — F0280 Dementia in other diseases classified elsewhere without behavioral disturbance: Secondary | ICD-10-CM | POA: Insufficient documentation

## 2012-09-07 DIAGNOSIS — R55 Syncope and collapse: Secondary | ICD-10-CM | POA: Insufficient documentation

## 2012-09-07 DIAGNOSIS — G309 Alzheimer's disease, unspecified: Secondary | ICD-10-CM | POA: Insufficient documentation

## 2012-09-07 DIAGNOSIS — I10 Essential (primary) hypertension: Secondary | ICD-10-CM | POA: Insufficient documentation

## 2012-09-07 DIAGNOSIS — H3530 Unspecified macular degeneration: Secondary | ICD-10-CM | POA: Insufficient documentation

## 2012-09-07 DIAGNOSIS — G20A1 Parkinson's disease without dyskinesia, without mention of fluctuations: Secondary | ICD-10-CM | POA: Insufficient documentation

## 2012-09-07 LAB — BASIC METABOLIC PANEL
CO2: 28 mEq/L (ref 19–32)
Calcium: 9.4 mg/dL (ref 8.4–10.5)
Creatinine, Ser: 1.16 mg/dL (ref 0.50–1.35)
GFR calc non Af Amer: 53 mL/min — ABNORMAL LOW (ref >90)
Glucose, Bld: 106 mg/dL — ABNORMAL HIGH (ref 70–99)
Sodium: 141 mEq/L (ref 135–145)

## 2012-09-07 LAB — CBC WITH DIFFERENTIAL/PLATELET
Basophils Absolute: 0 10*3/uL (ref 0.0–0.1)
Eosinophils Absolute: 0.1 10*3/uL (ref 0.0–0.7)
Eosinophils Relative: 1 % (ref 0–5)
HCT: 35.1 % — ABNORMAL LOW (ref 39.0–52.0)
Lymphocytes Relative: 18 % (ref 12–46)
Lymphs Abs: 1.1 10*3/uL (ref 0.7–4.0)
MCH: 28.5 pg (ref 26.0–34.0)
MCV: 88.4 fL (ref 78.0–100.0)
Monocytes Absolute: 0.4 10*3/uL (ref 0.1–1.0)
Platelets: 137 10*3/uL — ABNORMAL LOW (ref 150–400)
RDW: 14.2 % (ref 11.5–15.5)
WBC: 6.3 10*3/uL (ref 4.0–10.5)

## 2012-09-07 LAB — URINALYSIS, ROUTINE W REFLEX MICROSCOPIC
Ketones, ur: NEGATIVE mg/dL
Leukocytes, UA: NEGATIVE
Nitrite: NEGATIVE
Protein, ur: NEGATIVE mg/dL
Urobilinogen, UA: 0.2 mg/dL (ref 0.0–1.0)
pH: 6.5 (ref 5.0–8.0)

## 2012-09-07 NOTE — ED Notes (Signed)
Attempt to call son unsuccessful

## 2012-09-07 NOTE — Telephone Encounter (Signed)
FYI: patient son called to let Dr. Tenny Craw know patient was taken to the [facility]

## 2012-09-07 NOTE — ED Provider Notes (Signed)
History     CSN: [id number]  Arrival date & time 09/07/12  1308   First MD Initiated Contact with Patient 09/07/12 [phone]      Chief Complaint  Patient presents with  . Loss of Consciousness    (Consider location/radiation/quality/duration/timing/severity/associated sxs/prior treatment) HPI Comments: Ricky [name] presents from home for evaluation after a witnessed syncopal event at home. He was sitting down at the table when he slumped forward. He had a 5-7 minute period of being unresponsive. He is soon thereafter returned to to his normal mental status. He denies having any pain, headache, difficulty speaking or forming words, difficulty swallowing, neck pain, palpitations, chest pain, abdominal pain, or nausea. He reports that he has had a normal appetite and feels well.  Patient is a 78 y.o. male presenting with syncope. The history is provided by the patient and the EMS personnel. No language interpreter was used.  Loss of Consciousness This is a recurrent problem. The current episode started 1 to 2 hours ago. The problem has been rapidly improving. Pertinent negatives include no chest pain, no abdominal pain, no headaches and no shortness of breath. Nothing aggravates the symptoms. Nothing relieves the symptoms.    Past Medical History  Diagnosis Date  . IBS (irritable bowel syndrome)   . Diverticulosis   . Cataract   . OAB (overactive bladder)   . Macular degeneration   . Alzheimer disease   . Hypertension   . Arthritis   . Osteoporosis   . Parkinsonian syndrome   . Syncope, vasovagal   . Neuromuscular disorder     parkisonian syndrome  . Dementia due to Parkinson's disease without behavioral disturbance     Past Surgical History  Procedure Date  . Esophagogastroduodenoscopy   . Eye surgery     LEFT CATARACT    Family History  Problem Relation Age of Onset  . Cancer Mother   . Stroke Father   . Liver disease Father   . Kidney disease Brother     History    Substance Use Topics  . Smoking status: Never Smoker   . Smokeless tobacco: Never Used  . Alcohol Use: No      Review of Systems  Respiratory: Negative for shortness of breath.   Cardiovascular: Positive for syncope. Negative for chest pain.  Gastrointestinal: Negative for abdominal pain.  Neurological: Negative for headaches.  All other systems reviewed and are negative.    Allergies  Review of patient's allergies indicates no known allergies.  Home Medications   Current Outpatient Rx  Name Route Sig Dispense Refill  . ACETAMINOPHEN 500 MG PO TABS Oral Take 500 mg by mouth 2 (two) times daily.    . ASPIRIN EC 81 MG PO TBEC Oral Take 1 tablet (81 mg total) by mouth daily. 30 tablet 11  . ATORVASTATIN CALCIUM 10 MG PO TABS Oral Take 1 tablet (10 mg total) by mouth daily at 6 PM. 30 tablet 0  . CALTRATE 600+D PLUS 600-800 MG-UNIT PO CHEW Oral Chew 1 tablet by mouth 2 (two) times daily.    Marland Kitchen FERROUS SULFATE 325 (65 FE) MG PO TABS Oral Take 325 mg by mouth every other day.    Marland Kitchen HYDRALAZINE HCL 25 MG PO TABS Oral Take 25 mg by mouth 3 (three) times daily.    Marland Kitchen BOOST PO LIQD Oral Take 1 Container by mouth 2 (two) times daily between meals.    Marland Kitchen LEVOTHYROXINE SODIUM 25 MCG PO TABS Oral Take 25 mcg by mouth  daily.    Marland Kitchen LORATADINE 10 MG PO TABS Oral Take 10 mg by mouth daily as needed. For allergies    . MELATONIN 5 MG/15ML PO LIQD Oral Take 5 mg by mouth at bedtime as needed. For sleep    . MEMANTINE HCL 10 MG PO TABS Oral Take 10 mg by mouth 2 (two) times daily.     . ADULT MULTIVITAMIN W/MINERALS CH Oral Take 1 tablet by mouth daily.    . ICAPS AREDS FORMULA PO Oral Take 1 capsule by mouth 2 (two) times daily.     Marland Kitchen POLYETHYLENE GLYCOL 3350 PO PACK Oral Take 17 g by mouth daily.    Marland Kitchen TAMSULOSIN HCL 0.4 MG PO CAPS Oral Take 0.4 mg by mouth daily at 12 noon. 1/2 hour after lunch    . VITAMIN B-12 1000 MCG PO TABS Oral Take 5,000 mcg by mouth daily.      BP 111/63  Pulse 70  Temp  97.9 F (36.6 C) (Oral)  Resp 18  SpO2 97%  Physical Exam  Nursing note and vitals reviewed. Constitutional: He is oriented to person, place, and time. He appears well-developed and well-nourished. No distress. He is not intubated.  HENT:  Head: Normocephalic and atraumatic.  Right Ear: External ear normal.  Left Ear: External ear normal.  Nose: Nose normal.  Mouth/Throat: Oropharynx is clear and moist. No oropharyngeal exudate.  Eyes: Conjunctivae normal and EOM are normal. Pupils are equal, round, and reactive to light. Right eye exhibits no discharge. Left eye exhibits no discharge. No scleral icterus.  Neck: Trachea normal and phonation normal. Neck supple. Normal carotid pulses and no JVD present. No spinous process tenderness and no muscular tenderness present. Carotid bruit is not present. No rigidity. Decreased range of motion present. No tracheal deviation, no edema and no erythema present. No mass and no thyromegaly present.  Cardiovascular: Normal rate, regular rhythm, normal heart sounds, intact distal pulses and normal pulses.  PMI is not displaced.  Exam reveals no gallop, no friction rub and no decreased pulses.   No murmur heard. Pulmonary/Chest: Effort normal and breath sounds normal. No accessory muscle usage or stridor. No apnea, not tachypneic and not bradypneic. He is not intubated. No respiratory distress. He has no decreased breath sounds. He has no wheezes. He has no rhonchi. He has no rales. He exhibits no tenderness, no bony tenderness and no retraction.  Abdominal: Soft. Bowel sounds are normal. He exhibits no distension and no mass. There is no hepatosplenomegaly. There is no tenderness. There is no rebound, no guarding and no CVA tenderness.  Musculoskeletal: He exhibits no edema and no tenderness.       Brace or support located on left lower leg with bandaging around proximal left foot and ankle. Pulses are intact  Lymphadenopathy:    He has no cervical  adenopathy.  Neurological: He is alert and oriented to person, place, and time. No cranial nerve deficit. He exhibits normal muscle tone. Coordination normal. GCS eye subscore is 4. GCS verbal subscore is 5. GCS motor subscore is 6. He displays no Babinski's sign on the right side.  Skin: Skin is warm and dry. No rash noted. He is not diaphoretic. No erythema. No pallor.  Psychiatric: He has a normal mood and affect. His behavior is normal.    ED Course  Procedures (including critical care time)   Labs Reviewed  CBC WITH DIFFERENTIAL  BASIC METABOLIC PANEL  URINALYSIS, ROUTINE W REFLEX MICROSCOPIC   No  results found.   No diagnosis found.   Date: 09/07/2012  Rate: 70 bpm  Rhythm: atrial fibrillation  QRS Axis: left  Intervals: normal  ST/T Wave abnormalities: nonspecific ST changes  Conduction Disutrbances:right bundle branch block, LAFB  Narrative Interpretation: + tremor-like motion artifact.  Suspect rhythm is sinus and not atrial; fibrillation.  + PVC  Old EKG Reviewed: unchanged      MDM  Pt presents for evaluation after a syncopal event while sitting at the table this morning.  He is currently alert and oriented.  He denies pain and he has had multiple similar episodes in the past.  Will review his available medical record, obtain orthostatic VSs, and basic labs.  Will review results as available and provide the appropriate disposition.  1520.  Note essentially nl labs.  Pt is awake and oriented at his baseline.  He has had multiple similar episodes over the last 3 years.  It has sometimes been associated with bradycardia or hypotension however today neither have been observed.  Discussed further options for care with his son.  He states they are comfortable returning home.  The chest xray does have a small right basilar opacity concerning for atelectasis or infiltrate.  He has no clinical evidence of pneumonia.  Plan discharge home.      Tobin Chad, MD 09/07/12  1531

## 2012-09-07 NOTE — ED Notes (Signed)
MD at bedside. 

## 2012-09-07 NOTE — ED Notes (Signed)
Pt presents with a syncopal episode lasting approx 5-7 minutes with no injury.

## 2012-09-07 NOTE — ED Notes (Signed)
Spoke to son who states that he will be coming to get patient dressed

## 2012-09-08 NOTE — Telephone Encounter (Signed)
Patient taken to [facility] ER. Will let Dr.Ross know.

## 2012-09-17 ENCOUNTER — Encounter (HOSPITAL_BASED_OUTPATIENT_CLINIC_OR_DEPARTMENT_OTHER): Payer: Medicare Other | Attending: General Surgery

## 2012-09-17 DIAGNOSIS — L8993 Pressure ulcer of unspecified site, stage 3: Secondary | ICD-10-CM | POA: Insufficient documentation

## 2012-09-17 DIAGNOSIS — L89609 Pressure ulcer of unspecified heel, unspecified stage: Secondary | ICD-10-CM | POA: Insufficient documentation

## 2012-10-05 ENCOUNTER — Telehealth: Payer: Self-pay | Admitting: Family Medicine

## 2012-10-05 NOTE — Telephone Encounter (Signed)
LM

## 2012-10-19 ENCOUNTER — Ambulatory Visit (INDEPENDENT_AMBULATORY_CARE_PROVIDER_SITE_OTHER): Payer: Medicare Other | Admitting: Physician Assistant

## 2012-10-19 ENCOUNTER — Encounter: Payer: Self-pay | Admitting: Physician Assistant

## 2012-10-19 VITALS — BP 116/70 | HR 81 | Ht 71.0 in | Wt 180.4 lb

## 2012-10-19 DIAGNOSIS — G20 Parkinson's disease: Secondary | ICD-10-CM

## 2012-10-19 DIAGNOSIS — R55 Syncope and collapse: Secondary | ICD-10-CM

## 2012-10-19 DIAGNOSIS — N400 Enlarged prostate without lower urinary tract symptoms: Secondary | ICD-10-CM

## 2012-10-19 DIAGNOSIS — I10 Essential (primary) hypertension: Secondary | ICD-10-CM

## 2012-10-19 NOTE — Patient Instructions (Addendum)
21 DAY EVENT MONITOR NEEDED; DX SYNCOPE   FOLLOW UP WITH DR. ROSS IN ABOUT 4-6 WEEKS  NO CHANGES WERE MADE TODAY

## 2012-10-19 NOTE — Progress Notes (Signed)
[address], [address] [place], [place]  [address] Phone: [phone], Fax:  [phone]  Date:  10/19/2012   Name:  Ricky [name]   DOB:  1917/01/22   MRN:  [id number]  PCP:  Bufford Spikes, DO  Primary Cardiologist:  Dr. Dietrich Pates  Primary Electrophysiologist:  None    History of Present Illness: Ricky [name] is a 78 y.o. male who returns for evaluation of syncope.    He has a hx of syncope, HTN, Alzheimer's dementia, Parkinsonism, hypothyroidism, pulmonary embolism. He was seen in 2011 for syncope. He was bradycardic and Aricept was discontinued. He was not felt to be a candidate for pacemaker. Chest CT in 10/2010 demonstrated chronic pulmonary emboli bilaterally without definitive acute pulmonary emboli, dilated descending thoracic aorta measuring 4 cm, coronary artery calcification.  Echocardiogram 10/2011: Mild LVH, EF 55-60%, grade 1 diastolic dysfunction, trivial AI, aortic root upper normal size, moderate LAE, PASP 37. Admitted to the hospital with syncope in 12/12 and coumadin stopped due to hx of falls. He was seen by cardiology then and noted to have an event monitor in 2011 without arrhythmia.  Syncope thought to be vasovagal at that time.  Admitted in 4/13 with syncope in setting of UTI.  Last seen by Dr. Tenny Craw 06/2012. He was admitted prior to that visit with a right brain lacunar infarct.  MRA was negative for ICA stenosis.  Patient is followed by Dr. Sandria Manly of neurology for dementia. He was evaluated in the emergency room 09/07/12 for recurrent syncope.  Patient is here with his son who provided most of the history. There has been an increase in frequency of his syncope over the last couple of months. These episodes always occur in the morning around 8 to 9 AM. With the exception of one episode several years ago, they always occur when he is sitting. The patient denies a prodrome. He denies any confusion or weakness when regaining consciousness. His son tells me  that the patient seems to be breathing normally and has detected a normal pulse. There have been no real episodes of seizure-like activity. Over the years, he has had one episode of loss of bowel or bladder function. The patient denies chest pain, dyspnea, orthopnea, PND or edema. No abnormal weight gain.  Labs (8/13):          TSH 2.271 Labs (09/07/12):    K 3.9, creatinine 1.16, Hgb 11.3, platelets 137,000, CXR nothing acute  Wt Readings from Last 3 Encounters:  07/01/12 172 lb (78.019 kg)  06/21/12 170 lb 3.2 oz (77.202 kg)  02/22/12 173 lb 8 oz (78.7 kg)     Past Medical History  Diagnosis Date  . IBS (irritable bowel syndrome)   . Diverticulosis   . Cataract   . OAB (overactive bladder)   . Macular degeneration   . Alzheimer disease   . Hypertension   . Arthritis   . Osteoporosis   . Parkinsonian syndrome   . Syncope, vasovagal   . Neuromuscular disorder     parkisonian syndrome  . Dementia due to Parkinson's disease without behavioral disturbance     Current Outpatient Prescriptions  Medication Sig Dispense Refill  . acetaminophen (TYLENOL) 500 MG tablet Take 500 mg by mouth 2 (two) times daily.      Marland Kitchen aspirin EC 81 MG tablet Take 1 tablet (81 mg total) by mouth daily.  30 tablet  11  . atorvastatin (LIPITOR) 10 MG tablet Take 1 tablet (10 mg total)  by mouth daily at 6 PM.  30 tablet  0  . Calcium Carbonate-Vit D-Min (CALTRATE 600+D PLUS) 600-800 MG-UNIT CHEW Chew 1 tablet by mouth 2 (two) times daily.      . ferrous sulfate 325 (65 FE) MG tablet Take 325 mg by mouth every other day.      Marland Kitchen guaiFENesin (MUCINEX) 600 MG 12 hr tablet Take 600 mg by mouth 2 (two) times daily.      . hydrALAZINE (APRESOLINE) 25 MG tablet Take 25 mg by mouth 3 (three) times daily.      Marland Kitchen lactose free nutrition (BOOST) LIQD Take 1 Container by mouth 2 (two) times daily between meals.      Marland Kitchen levothyroxine (SYNTHROID, LEVOTHROID) 25 MCG tablet Take 25 mcg by mouth daily.      Marland Kitchen loratadine  (CLARITIN) 10 MG tablet Take 10 mg by mouth daily as needed. For allergies      . Melatonin 5 MG/15ML LIQD Take 5 mg by mouth at bedtime as needed. For sleep      . memantine (NAMENDA) 10 MG tablet Take 10 mg by mouth 2 (two) times daily.       . Multiple Vitamin (MULTIVITAMIN WITH MINERALS) TABS Take 1 tablet by mouth daily.      . Multiple Vitamins-Minerals (ICAPS AREDS FORMULA PO) Take 1 capsule by mouth 2 (two) times daily.       . polyethylene glycol (MIRALAX / GLYCOLAX) packet Take 17 g by mouth daily.      . Tamsulosin HCl (FLOMAX) 0.4 MG CAPS Take 0.4 mg by mouth daily at 12 noon. 1/2 hour after lunch      . vitamin B-12 (CYANOCOBALAMIN) 1000 MCG tablet Take 5,000 mcg by mouth daily.        Allergies:   No Known Allergies  Social History:  The patient  reports that he has never smoked. He has never used smokeless tobacco. He reports that he does not drink alcohol or use illicit drugs.   ROS:  Please see the history of present illness.   No fevers, cough, melena, hematochezia, hematuria.   All other systems reviewed and negative.   PHYSICAL EXAM: VS:  BP 116/70  Pulse 81  Ht 5\' 11"  (1.803 m)  Wt 180 lb 6.4 oz (81.829 kg)  BMI 25.16 kg/m2  Filed Vitals:   10/19/12 0913 10/19/12 0930 10/19/12 0931 10/19/12 0932  BP: 155/87 155/87 148/81 116/70  Pulse: 82 79 74 81  Height: 5\' 11"  (1.803 m)  5\' 11"  (1.803 m)   Weight: 180 lb 6.4 oz (81.829 kg)  180 lb 6.4 oz (81.829 kg)      Well nourished, well developed, in no acute distress HEENT: normal Neck: no JVD at 90 Cardiac:  normal S1, S2; RRR; no murmur Lungs:  clear to auscultation bilaterally, no wheezing, rhonchi or rales Abd: soft, nontender, no hepatomegaly Ext: no edema Skin: warm and dry Neuro:  CNs 2-12 intact, no focal abnormalities noted  EKG:  NSR, HR 82, RBBB      ASSESSMENT AND PLAN:  1. Syncope:  Etiology of his syncope is unclear. By description, it does not sound cardiogenic. He had a normal echocardiogram  within the last 12 months. He did start on Flomax several months ago. However, this does not seem to be related. He does have some orthostatic blood pressure drop while standing on exam today. However, the patient never has symptoms when he goes from lying or sitting to a standing position.  He does not have a prodrome. He does not feel confused upon awakening. In essence, his son states that he appears as though he is sleeping when these episodes occur. However, he cannot be awoken with just shaking him.  He has to come out of it on his own. He does have some evidence of conduction system disease on EKG. His ECG is unchanged from prior. We had a long discussion about further testing. At this point, I recommend proceeding with a 21 day event monitor.  I am not certain he is a good candidate for a pacemaker.  If a significant arrhythmia is identified, I suppose this could be reconsidered and he could be seen by EP.  Followup with Dr. Tenny Craw in 4-6 weeks.  2. Parkinsonism:   Question if he has autonomic dysfunction from this contributing to his syncope.  3. BPH:   I asked his son to change Flomax to QHS.    4. Hypertension:  BP elevated while sitting.  With his orthostasis, will continue current Rx.   Signed, Tereso Newcomer, PA-C  9:10 AM 10/19/2012

## 2012-10-22 ENCOUNTER — Telehealth (HOSPITAL_COMMUNITY): Payer: Self-pay | Admitting: *Deleted

## 2012-10-22 NOTE — Telephone Encounter (Signed)
Patient enrolled for e-cardio event monitor to be mailed 10/22/12. TK

## 2012-10-22 NOTE — Progress Notes (Signed)
Patient ID: Ricky [name], male   DOB: Feb 04, 1917, 78 y.o.   MRN: [id number] Patient daughter [name] called to ask if the event monitor could be mailed out to the patient and the son will put the monitor on the patient. I enrolled the patient through ecardio for the monitor to be mailed out to the patient and I also called the patient's daughter to let her know that it will be mailed. I also asked Eunice Blase to cancel his appointment for 10/26/12 for monitor placement.

## 2012-10-22 NOTE — Progress Notes (Signed)
Patient ID: Ricky [name], male   DOB: Jan 01, 1917, 78 y.o.   MRN: [id number] Enrolled patient so monitor can be mailed out to his home

## 2012-10-31 ENCOUNTER — Encounter: Payer: Self-pay | Admitting: Internal Medicine

## 2012-10-31 DIAGNOSIS — R55 Syncope and collapse: Secondary | ICD-10-CM

## 2012-11-13 ENCOUNTER — Encounter (HOSPITAL_COMMUNITY): Payer: Self-pay | Admitting: Emergency Medicine

## 2012-11-13 ENCOUNTER — Emergency Department (INDEPENDENT_AMBULATORY_CARE_PROVIDER_SITE_OTHER)
Admission: EM | Admit: 2012-11-13 | Discharge: 2012-11-13 | Disposition: A | Discharge status: Home / Self Care | Payer: Medicare Other | Source: Home / Self Care | Attending: Family Medicine | Admitting: Family Medicine

## 2012-11-13 DIAGNOSIS — N400 Enlarged prostate without lower urinary tract symptoms: Secondary | ICD-10-CM

## 2012-11-13 DIAGNOSIS — R32 Unspecified urinary incontinence: Secondary | ICD-10-CM

## 2012-11-13 DIAGNOSIS — R310 Gross hematuria: Secondary | ICD-10-CM

## 2012-11-13 MED ORDER — CEPHALEXIN 500 MG PO CAPS: 500.0000 mg | ORAL_CAPSULE | Freq: Four times a day (QID) | ORAL | Status: DC

## 2012-11-13 NOTE — ED Notes (Signed)
Pt is here w/son... C/o hematuria since midnight.... No hx of kidney stones... Denies: dysuria, fevers, vomiting, nauseas, diarrhea... Started using a condom catheter x45 days ago

## 2012-11-13 NOTE — ED Provider Notes (Signed)
History     CSN: [id number]  Arrival date & time 11/13/12  1142   First MD Initiated Contact with Patient 11/13/12 1146      Chief Complaint  Patient presents with  . Hematuria    (Consider location/radiation/quality/duration/timing/severity/associated sxs/prior treatment) Patient is a 78 y.o. male presenting with hematuria. The history is provided by the patient and a relative.  Hematuria This is a new problem. The current episode started yesterday. The problem has been gradually improving since onset. He describes the hematuria as gross (last eve but today urine is clear.) hematuria. He is experiencing no pain. He describes his urine color as yellow. Irritative symptoms include frequency. (Incontinence) His past medical history is significant for BPH.    Past Medical History  Diagnosis Date  . IBS (irritable bowel syndrome)   . Diverticulosis   . Cataract   . OAB (overactive bladder)   . Macular degeneration   . Alzheimer disease   . Hypertension   . Arthritis   . Osteoporosis   . Parkinsonian syndrome   . Syncope, vasovagal   . Neuromuscular disorder     parkisonian syndrome  . Dementia due to Parkinson's disease without behavioral disturbance     Past Surgical History  Procedure Date  . Esophagogastroduodenoscopy   . Eye surgery     LEFT CATARACT    Family History  Problem Relation Age of Onset  . Cancer Mother   . Stroke Father   . Liver disease Father   . Kidney disease Brother     History  Substance Use Topics  . Smoking status: Never Smoker   . Smokeless tobacco: Never Used  . Alcohol Use: No      Review of Systems  Gastrointestinal: Negative.   Genitourinary: Positive for frequency, hematuria and difficulty urinating. Negative for discharge and penile pain.  Neurological: Positive for tremors and weakness.    Allergies  Review of patient's allergies indicates no known allergies.  Home Medications   Current Outpatient Rx  Name  Route   Sig  Dispense  Refill  . ACETAMINOPHEN 500 MG PO TABS   Oral   Take 500 mg by mouth 2 (two) times daily.         . ASPIRIN EC 81 MG PO TBEC   Oral   Take 1 tablet (81 mg total) by mouth daily.   30 tablet   11   . ATORVASTATIN CALCIUM 10 MG PO TABS   Oral   Take 1 tablet (10 mg total) by mouth daily at 6 PM.   30 tablet   0   . CALTRATE 600+D PLUS 600-800 MG-UNIT PO CHEW   Oral   Chew 1 tablet by mouth 2 (two) times daily.         Marland Kitchen FERROUS SULFATE 325 (65 FE) MG PO TABS   Oral   Take 325 mg by mouth every other day.         . GUAIFENESIN ER 600 MG PO TB12   Oral   Take 600 mg by mouth 2 (two) times daily.         Marland Kitchen HYDRALAZINE HCL 25 MG PO TABS   Oral   Take 25 mg by mouth 3 (three) times daily.         Marland Kitchen BOOST PO LIQD   Oral   Take 1 Container by mouth 2 (two) times daily between meals.         Marland Kitchen LEVOTHYROXINE SODIUM 25 MCG PO TABS  Oral   Take 25 mcg by mouth daily.         Marland Kitchen LORATADINE 10 MG PO TABS   Oral   Take 10 mg by mouth daily as needed. For allergies         . MELATONIN 5 MG/15ML PO LIQD   Oral   Take 5 mg by mouth at bedtime as needed. For sleep         . MEMANTINE HCL 10 MG PO TABS   Oral   Take 10 mg by mouth 2 (two) times daily.          . ADULT MULTIVITAMIN W/MINERALS CH   Oral   Take 1 tablet by mouth daily.         . ICAPS AREDS FORMULA PO   Oral   Take 1 capsule by mouth 2 (two) times daily.          Marland Kitchen POLYETHYLENE GLYCOL 3350 PO PACK   Oral   Take 17 g by mouth daily.         Marland Kitchen TAMSULOSIN HCL 0.4 MG PO CAPS   Oral   Take 0.4 mg by mouth daily at 12 noon. 1/2 hour after lunch         . VITAMIN B-12 1000 MCG PO TABS   Oral   Take 5,000 mcg by mouth daily.         . CEPHALEXIN 500 MG PO CAPS   Oral   Take 1 capsule (500 mg total) by mouth 4 (four) times daily. Take all of medicine and drink lots of fluids   28 capsule   0     BP 149/75  Pulse 69  Temp 97.5 F (36.4 C) (Oral)  Resp 18   SpO2 96%  Physical Exam  Nursing note and vitals reviewed. Constitutional: He is oriented to person, place, and time. He appears well-developed and well-nourished.  Genitourinary: Testes normal and penis normal. Prostate is enlarged.       4+ enlarged prostate.  Neurological: He is alert and oriented to person, place, and time.  Skin: Skin is warm and dry.    ED Course  Procedures (including critical care time)  Labs Reviewed - No data to display No results found.   1. BPH (benign prostatic hyperplasia)   2. Hematuria, gross   3. Incontinence of urine       MDM  Unable to obtain u/a.        Linna Hoff, MD 11/13/12 [phone]

## 2012-11-13 NOTE — ED Notes (Signed)
Patient unable to give sample.

## 2012-11-13 NOTE — ED Notes (Signed)
Pt unable to provide urine specimen.  

## 2012-11-24 ENCOUNTER — Emergency Department (HOSPITAL_COMMUNITY)
Admission: EM | Admit: 2012-11-24 | Discharge: 2012-11-24 | Disposition: A | Discharge status: Home / Self Care | Payer: Medicare HMO | Attending: Emergency Medicine | Admitting: Emergency Medicine

## 2012-11-24 ENCOUNTER — Telehealth: Payer: Self-pay | Admitting: Internal Medicine

## 2012-11-24 ENCOUNTER — Encounter (HOSPITAL_COMMUNITY): Payer: Self-pay

## 2012-11-24 DIAGNOSIS — G20A1 Parkinson's disease without dyskinesia, without mention of fluctuations: Secondary | ICD-10-CM | POA: Insufficient documentation

## 2012-11-24 DIAGNOSIS — Z79899 Other long term (current) drug therapy: Secondary | ICD-10-CM | POA: Insufficient documentation

## 2012-11-24 DIAGNOSIS — Z8669 Personal history of other diseases of the nervous system and sense organs: Secondary | ICD-10-CM | POA: Insufficient documentation

## 2012-11-24 DIAGNOSIS — Z87448 Personal history of other diseases of urinary system: Secondary | ICD-10-CM | POA: Insufficient documentation

## 2012-11-24 DIAGNOSIS — F0280 Dementia in other diseases classified elsewhere without behavioral disturbance: Secondary | ICD-10-CM | POA: Insufficient documentation

## 2012-11-24 DIAGNOSIS — R55 Syncope and collapse: Secondary | ICD-10-CM | POA: Insufficient documentation

## 2012-11-24 DIAGNOSIS — G20 Parkinson's disease: Secondary | ICD-10-CM | POA: Insufficient documentation

## 2012-11-24 DIAGNOSIS — H3530 Unspecified macular degeneration: Secondary | ICD-10-CM | POA: Insufficient documentation

## 2012-11-24 DIAGNOSIS — I10 Essential (primary) hypertension: Secondary | ICD-10-CM | POA: Insufficient documentation

## 2012-11-24 DIAGNOSIS — M129 Arthropathy, unspecified: Secondary | ICD-10-CM | POA: Insufficient documentation

## 2012-11-24 DIAGNOSIS — Z8719 Personal history of other diseases of the digestive system: Secondary | ICD-10-CM | POA: Insufficient documentation

## 2012-11-24 DIAGNOSIS — G309 Alzheimer's disease, unspecified: Secondary | ICD-10-CM | POA: Insufficient documentation

## 2012-11-24 DIAGNOSIS — M810 Age-related osteoporosis without current pathological fracture: Secondary | ICD-10-CM | POA: Insufficient documentation

## 2012-11-24 LAB — COMPREHENSIVE METABOLIC PANEL
AST: 19 U/L (ref 0–37)
Albumin: 3.5 g/dL (ref 3.5–5.2)
Alkaline Phosphatase: 87 U/L (ref 39–117)
Chloride: 100 mEq/L (ref 96–112)
Potassium: 4.3 mEq/L (ref 3.5–5.1)
Total Bilirubin: 0.4 mg/dL (ref 0.3–1.2)
Total Protein: 7.8 g/dL (ref 6.0–8.3)

## 2012-11-24 LAB — URINALYSIS, ROUTINE W REFLEX MICROSCOPIC
Glucose, UA: NEGATIVE mg/dL
Ketones, ur: NEGATIVE mg/dL
Protein, ur: 30 mg/dL — AB
pH: 8.5 — ABNORMAL HIGH (ref 5.0–8.0)

## 2012-11-24 LAB — CBC WITH DIFFERENTIAL/PLATELET
Basophils Absolute: 0 10*3/uL (ref 0.0–0.1)
Basophils Relative: 0 % (ref 0–1)
Eosinophils Absolute: 0.1 10*3/uL (ref 0.0–0.7)
MCHC: 31.7 g/dL (ref 30.0–36.0)
Neutro Abs: 3.8 10*3/uL (ref 1.7–7.7)
Neutrophils Relative %: 68 % (ref 43–77)
Platelets: 151 10*3/uL (ref 150–400)
RDW: 13.5 % (ref 11.5–15.5)

## 2012-11-24 LAB — URINE MICROSCOPIC-ADD ON

## 2012-11-24 LAB — TROPONIN I: Troponin I: <0.3 ng/mL (ref <0.30)

## 2012-11-24 NOTE — Telephone Encounter (Signed)
Spoke with [name] ( patient's daughter) and advised her that the ER notes from today state that there were no abnormal recordings on his event monitor. Advised her to continue to have her father use the monitor and follow up with Dr.Ross on 1/13. [name] verbalized understanding.

## 2012-11-24 NOTE — Telephone Encounter (Signed)
New Problem:    Patient's son called in to say that he had  A syncope episode, was taken to [facility] and was wareing the monitor.  Hoped that the results will be placed in EPIC for you to review.  Please call back.

## 2012-11-24 NOTE — ED Notes (Signed)
No family members have arrived as of yet; called family again; questioned their ETA - they replied "We are on the way now"; when asked what the delay had been, he replied "We have been having discussions with physicians and such. We are on the way now"

## 2012-11-24 NOTE — ED Provider Notes (Signed)
History     CSN: [id number]  Arrival date & time 11/24/12  4098   First MD Initiated Contact with Patient 11/24/12 0831      Chief Complaint  Patient presents with  . Altered Mental Status    pt having interm syncopal episodes per family; is currently on Holter monitor for same; pt is baseline demented but communicative        (Consider location/radiation/quality/duration/timing/severity/associated sxs/prior treatment) HPI Comments: Patient brought the ER for evaluation of syncope. Patient reportedly had a period of approximately 5 minutes of unresponsiveness at home. Patient has had multiple similar symptoms in the past. He has been evaluated multiple times but hasn't outpatient and here in the ER for this. Patient is currently wearing an event monitor for these episodes.  To the ER, patient is without complaints. No chest pain, shortness of breath. No recent illness including fever, vomiting, diarrhea or cough. Patient back to his normal baseline.   Past Medical History  Diagnosis Date  . IBS (irritable bowel syndrome)   . Diverticulosis   . Cataract   . OAB (overactive bladder)   . Macular degeneration   . Alzheimer disease   . Hypertension   . Arthritis   . Osteoporosis   . Parkinsonian syndrome   . Syncope, vasovagal   . Neuromuscular disorder     parkisonian syndrome  . Dementia due to Parkinson's disease without behavioral disturbance     Past Surgical History  Procedure Date  . Esophagogastroduodenoscopy   . Eye surgery     LEFT CATARACT    Family History  Problem Relation Age of Onset  . Cancer Mother   . Stroke Father   . Liver disease Father   . Kidney disease Brother     History  Substance Use Topics  . Smoking status: Never Smoker   . Smokeless tobacco: Never Used  . Alcohol Use: No      Review of Systems  Constitutional: Negative.   Respiratory: Negative.   Cardiovascular: Negative.   Neurological: Positive for syncope.  All other  systems reviewed and are negative.    Allergies  Review of patient's allergies indicates no known allergies.  Home Medications   Current Outpatient Rx  Name  Route  Sig  Dispense  Refill  . ACETAMINOPHEN 500 MG PO TABS   Oral   Take 500 mg by mouth 2 (two) times daily.         . ATORVASTATIN CALCIUM 10 MG PO TABS   Oral   Take 1 tablet (10 mg total) by mouth daily at 6 PM.   30 tablet   0   . CALTRATE 600+D PLUS 600-800 MG-UNIT PO CHEW   Oral   Chew 1 tablet by mouth 2 (two) times daily with a meal.          . CEPHALEXIN 500 MG PO CAPS   Oral   Take 1 capsule (500 mg total) by mouth 4 (four) times daily. Take all of medicine and drink lots of fluids   28 capsule   0   . CETIRIZINE HCL 10 MG PO TABS   Oral   Take 10 mg by mouth daily as needed. For allergies         . VITAMIN B-12 PO   Oral   Take 5,000 mcg by mouth daily.         Marland Kitchen HYDRALAZINE HCL 25 MG PO TABS   Oral   Take 25 mg by mouth 3 (three)  times daily.         Marland Kitchen BOOST PO LIQD   Oral   Take 1 Container by mouth 2 (two) times daily between meals.         Marland Kitchen LEVOTHYROXINE SODIUM 25 MCG PO TABS   Oral   Take 25 mcg by mouth daily.         Marland Kitchen MELATONIN EXTRA STRENGTH PO   Oral   Take 15 mLs by mouth at bedtime as needed. To help sleep         . MEMANTINE HCL 10 MG PO TABS   Oral   Take 10 mg by mouth 2 (two) times daily.          . ADULT MULTIVITAMIN W/MINERALS CH   Oral   Take 1 tablet by mouth daily.         . ICAPS AREDS FORMULA PO   Oral   Take 1 capsule by mouth 2 (two) times daily with a meal.          . POLYETHYLENE GLYCOL 3350 PO PACK   Oral   Take 17 g by mouth daily.         Marland Kitchen RANITIDINE HCL 150 MG PO TABS   Oral   Take 150 mg by mouth daily.         Marland Kitchen TAMSULOSIN HCL 0.4 MG PO CAPS   Oral   Take 0.4 mg by mouth daily at 12 noon. 1/2 hour after lunch           BP 145/71  Pulse 70  Temp 98 F (36.7 C) (Rectal)  Resp 16  SpO2 95%  Physical  Exam  Constitutional: He is oriented to person, place, and time. He appears well-developed and well-nourished. No distress.  HENT:  Head: Normocephalic and atraumatic.  Right Ear: Hearing normal.  Nose: Nose normal.  Mouth/Throat: Oropharynx is clear and moist and mucous membranes are normal.  Eyes: Conjunctivae normal and EOM are normal. Pupils are equal, round, and reactive to light.  Neck: Normal range of motion. Neck supple.  Cardiovascular: Normal rate, regular rhythm, S1 normal and S2 normal.  Exam reveals no gallop and no friction rub.   No murmur heard. Pulmonary/Chest: Effort normal and breath sounds normal. No respiratory distress. He exhibits no tenderness.  Abdominal: Soft. Normal appearance and bowel sounds are normal. There is no hepatosplenomegaly. There is no tenderness. There is no rebound, no guarding, no tenderness at McBurney's point and negative Murphy's sign. No hernia.  Musculoskeletal: Normal range of motion.  Neurological: He is alert and oriented to person, place, and time. He has normal strength. No cranial nerve deficit or sensory deficit. Coordination normal. GCS eye subscore is 4. GCS verbal subscore is 5. GCS motor subscore is 6.  Skin: Skin is warm, dry and intact. No rash noted. No cyanosis.  Psychiatric: He has a normal mood and affect. His speech is normal and behavior is normal. Thought content normal.    ED Course  Procedures (including critical care time)   Date: 11/24/2012  Rate: 68  Rhythm: normal sinus rhythm  QRS Axis: normal  Intervals: normal  ST/T Wave abnormalities: normal  Conduction Disutrbances:right bundle branch block  Narrative Interpretation:   Old EKG Reviewed: unchanged    Labs Reviewed  CBC WITH DIFFERENTIAL - Abnormal; Notable for the following:    RBC 4.12 (*)     Hemoglobin 11.6 (*)     HCT 36.6 (*)     All other  components within normal limits  COMPREHENSIVE METABOLIC PANEL - Abnormal; Notable for the following:     GFR calc non Af Amer 61 (*)     GFR calc Af Amer 71 (*)     All other components within normal limits  URINALYSIS, ROUTINE W REFLEX MICROSCOPIC - Abnormal; Notable for the following:    pH 8.5 (*)     Hgb urine dipstick LARGE (*)     Protein, ur 30 (*)     All other components within normal limits  TROPONIN I  URINE MICROSCOPIC-ADD ON   No results found.   Diagnosis: Syncope   MDM  Patient presents for evaluation of syncope. Patient has had numerous similar syncopal episodes over the last several years. Workup has always been negative, and no etiology has been discovered. He is currently being evaluated by [facility] for possible cardiogenic cause, although no true fact that they felt was likely not cardiac in origin. As the patient was wearing a 21-day event monitor, the cardiology office was contacted today showed no events captured. No arrhythmia events were captured today for this monitor.  As the patient is at his baseline and all of his labs are unremarkable, patient will be discharged to followup with cardiology and his primary doctor.    Gilda Crease, MD 11/24/12 1049

## 2012-11-24 NOTE — ED Notes (Signed)
Spoke with family members via telephone; d/c instructions reviewed; verbalized understanding; states are on way to pick pt up

## 2012-11-24 NOTE — ED Notes (Signed)
Family has arrived to transport pt home; d/c instructions given; both verbalized understanding

## 2012-11-29 ENCOUNTER — Encounter: Payer: Self-pay | Admitting: Internal Medicine

## 2012-11-29 ENCOUNTER — Ambulatory Visit (INDEPENDENT_AMBULATORY_CARE_PROVIDER_SITE_OTHER): Payer: BC Managed Care – PPO | Admitting: Internal Medicine

## 2012-11-29 VITALS — BP 141/78 | HR 80 | Ht 72.0 in | Wt 180.0 lb

## 2012-11-29 DIAGNOSIS — I10 Essential (primary) hypertension: Secondary | ICD-10-CM

## 2012-11-29 DIAGNOSIS — R55 Syncope and collapse: Secondary | ICD-10-CM

## 2012-11-29 NOTE — Progress Notes (Signed)
HPI  Patient is a 79 yo with a history of Altzheimers, HTN, Parkinson's, hypothyroidsm, PE He was last in cardiology clinic in December  Seen by Wende Mott.He has a hx of syncope, HTN, Alzheimer's dementia, Parkinsonism, hypothyroidism, pulmonary embolism. He was seen in 2011 for syncope. He was bradycardic and Aricept was discontinued. He was not felt to be a candidate for pacemaker. Chest CT in 10/2010 demonstrated chronic pulmonary emboli bilaterally without definitive acute pulmonary emboli, dilated descending thoracic aorta measuring 4 cm, coronary artery calcification. Echocardiogram 10/2011: Mild LVH, EF 55-60%, grade 1 diastolic dysfunction, trivial AI, aortic root upper normal size, moderate LAE, PASP 37. Admitted to the hospital with syncope in 12/12 and coumadin stopped due to hx of falls. He was seen by cardiology then and noted to have an event monitor in 2011 without arrhythmia. Syncope thought to be vasovagal at that time. Admitted in 4/13 with syncope in setting of UTI. Last seen by Dr. Tenny Craw 06/2012. He was admitted prior to that visit with a right brain lacunar infarct. MRA was negative for ICA stenosis. Patient is followed by Dr. Sandria Manly of neurology for dementia. He was evaluated in the emergency room 09/07/12 for recurrent syncope.  Patient is here with his son who provided most of the history. There has been an increase in frequency of his syncope over the last couple of months. These episodes always occur in the morning around 8 to 9 AM. With the exception of one episode several years ago, they always occur when he is sitting. The patient denies a prodrome. He denies any confusion or weakness when regaining consciousness. His son tells me that the patient seems to be breathing normally and has detected a normal pulse. There have been no real episodes of seizure-like activity. Over the years, he has had one episode of loss of bowel or bladder function. The patient denies chest pain, dyspnea,  orthopnea, PND or edema. No abnormal weight gain.  No Known Allergies  Current Outpatient Prescriptions  Medication Sig Dispense Refill  . acetaminophen (TYLENOL) 500 MG tablet Take 500 mg by mouth 2 (two) times daily.      Marland Kitchen atorvastatin (LIPITOR) 10 MG tablet Take 1 tablet (10 mg total) by mouth daily at 6 PM.  30 tablet  0  . Calcium Carbonate-Vit D-Min (CALTRATE 600+D PLUS) 600-800 MG-UNIT CHEW Chew 1 tablet by mouth 2 (two) times daily with a meal.       . cetirizine (ZYRTEC) 10 MG tablet Take 10 mg by mouth daily as needed. For allergies      . Cyanocobalamin (VITAMIN B-12 PO) Take 5,000 mcg by mouth daily.      . hydrALAZINE (APRESOLINE) 25 MG tablet Take 25 mg by mouth 3 (three) times daily.      Marland Kitchen lactose free nutrition (BOOST) LIQD Take 0.5 Containers by mouth 2 (two) times daily between meals.       Marland Kitchen levothyroxine (SYNTHROID, LEVOTHROID) 25 MCG tablet Take 25 mcg by mouth daily.      Marland Kitchen MELATONIN EXTRA STRENGTH PO Take 15 mLs by mouth at bedtime as needed. To help sleep      . memantine (NAMENDA) 10 MG tablet Take 10 mg by mouth 2 (two) times daily.       . Multiple Vitamin (MULTIVITAMIN WITH MINERALS) TABS Take 1 tablet by mouth daily.      . Multiple Vitamins-Minerals (ICAPS AREDS FORMULA PO) Take 1 capsule by mouth 2 (two) times daily with a meal.       .  polyethylene glycol (MIRALAX / GLYCOLAX) packet Take 17 g by mouth daily.      . ranitidine (ZANTAC) 150 MG tablet Take 150 mg by mouth daily.      . Tamsulosin HCl (FLOMAX) 0.4 MG CAPS Take 0.4 mg by mouth daily at 12 noon. 1/2 hour after lunch        Past Medical History  Diagnosis Date  . IBS (irritable bowel syndrome)   . Diverticulosis   . Cataract   . OAB (overactive bladder)   . Macular degeneration   . Alzheimer disease   . Hypertension   . Arthritis   . Osteoporosis   . Parkinsonian syndrome   . Syncope, vasovagal   . Neuromuscular disorder     parkisonian syndrome  . Dementia due to Parkinson's disease  without behavioral disturbance     Past Surgical History  Procedure Date  . Esophagogastroduodenoscopy   . Eye surgery     LEFT CATARACT    Family History  Problem Relation Age of Onset  . Cancer Mother   . Stroke Father   . Liver disease Father   . Kidney disease Brother     History   Social History  . Marital Status: Single    Spouse Name: N/A    Number of Children: N/A  . Years of Education: N/A   Occupational History  . Not on file.   Social History Main Topics  . Smoking status: Never Smoker   . Smokeless tobacco: Never Used  . Alcohol Use: No  . Drug Use: No  . Sexually Active: No   Other Topics Concern  . Not on file   Social History Narrative  . No narrative on file    Review of Systems:  All systems reviewed.  They are negative to the above problem except as previously stated.  Vital Signs: BP 141/78  Pulse 80  Ht 6' (1.829 m)  Wt 180 lb (81.647 kg)  BMI 24.41 kg/m2  Physical Exam Patient in NAD  Appeared to be sleeping intermitt while I spoke with son HEENT:  Normocephalic, atraumatic  Neck: JVP is normal.    Lungs: clear to auscultation. No rales no wheezes.  Heart: Regular rate and rhythm. Normal S1, S2. No S3.   No significant murmurs. PMI not displaced.  Abdomen:  Supple, nontender. Normal bowel sounds. No masses. No hepatomegaly.  Extremities:   Good distal pulses throughout. No lower extremity edema.  Musculoskeletal :moving all extremities.     Assessment and Plan:  1.  Syncope  Patient recently seen in ER for another episode of syncope.  Set up with event monitor which has not shown a arrhythmia Patient's son brought in log of spells.  In 2010 his dad had 2 spells with change in position that probably were related to orthostatic intolerance.  All the spells since have occurred in mid morning while the patient is sitting.  The son says his dad becomes unresponsive  Almost like he was sleeping He has never had a spell in the  afternoon  These spells do not sound arrhythmic.  They do not sound orhtostatic.  He did drop his BP today on initial standing but it recovered.  I have recomm that the patient hold AM hydralazine.  He can cut rest of hydralazine in 1/2 to 12.5  Follow BP  Resume at full tab if high.  Overall, I am not convinced that spells are due to cardiovascular abnormality.  Question neurologic.  The patient is  slowing, significantly since I saw him last.  2.  HTN  Follow

## 2012-12-27 ENCOUNTER — Encounter (HOSPITAL_BASED_OUTPATIENT_CLINIC_OR_DEPARTMENT_OTHER): Payer: Medicare HMO | Attending: General Surgery

## 2012-12-27 DIAGNOSIS — F0280 Dementia in other diseases classified elsewhere without behavioral disturbance: Secondary | ICD-10-CM | POA: Insufficient documentation

## 2012-12-27 DIAGNOSIS — L8992 Pressure ulcer of unspecified site, stage 2: Secondary | ICD-10-CM | POA: Insufficient documentation

## 2012-12-27 DIAGNOSIS — R29818 Other symptoms and signs involving the nervous system: Secondary | ICD-10-CM | POA: Insufficient documentation

## 2012-12-27 DIAGNOSIS — L89609 Pressure ulcer of unspecified heel, unspecified stage: Secondary | ICD-10-CM | POA: Insufficient documentation

## 2012-12-27 DIAGNOSIS — G309 Alzheimer's disease, unspecified: Secondary | ICD-10-CM | POA: Insufficient documentation

## 2012-12-28 NOTE — Progress Notes (Signed)
[facility]  NAME:  Ricky [name], Ricky [name] NO.:  [id number]  MEDICAL RECORD NO.:  [id number]      DATE OF BIRTH:  10-19-1917  PHYSICIAN:  Ardath Sax, M.D.           VISIT DATE:                                  OFFICE VISIT   [name] is a 79 year old African American male that I have known for about a year.  This gentleman had pressure ulcers on his heels that he obtained in the hospital when he was there for treatment of his pneumonia.  He also has a history of hypertension and Alzheimer's and history of a stroke in the past.  He has some left-sided weakness.  The last time he was here, I took care of these pressure ulcers, which were stage III and remarkably healed up very nicely with debridements and then silver alginate, later collagen, and Apligraf.  Today he is fairly alert, answers questions.  His temperature is 98, pulse 69, respirations 18, blood pressure 170/97.  He returns because of another pressure ulcer on his left heel and apparently he got from lying in bed without protection.  I debrided this and it seems to me like it is only a stage II pressure ulcer on his left heel.  It is about 2 cm in diameter.  I think there is some dermis that is intact.  So I am going to treat this with silver alginate and a heel protector, and I will have him come back in a week.  DIAGNOSES: 1. Neurologic defects, probable cerebrovascular accident. 2. Alzheimer's.  OTHER DIAGNOSIS:  Hypertension.  His treatment at the wound clinic will be for a stage II pressure ulcer, left heel.     Ardath Sax, M.D.     PP/MEDQ  D:  12/27/2012  T:  12/28/2012  Job:  [id number]

## 2012-12-31 ENCOUNTER — Encounter: Payer: Self-pay | Admitting: *Deleted

## 2013-01-04 ENCOUNTER — Telehealth: Payer: Self-pay | Admitting: Internal Medicine

## 2013-01-04 NOTE — Telephone Encounter (Signed)
Pt will mail in bp readings.

## 2013-01-04 NOTE — Telephone Encounter (Signed)
Pt's son calling re BP readings , has three pages and would to email them to you, didn't wasn't to give out email address

## 2013-01-10 ENCOUNTER — Telehealth: Payer: Self-pay | Admitting: Internal Medicine

## 2013-01-10 NOTE — Telephone Encounter (Signed)
New problem    Blood pressure medication was changed. Fax b/p was sent over last week. Please advise on if adjustment might need to be made.

## 2013-01-11 NOTE — Telephone Encounter (Signed)
Advised pt's son that we have not yet received b/p readings.  Will notify when they arrive.

## 2013-01-17 ENCOUNTER — Encounter (HOSPITAL_BASED_OUTPATIENT_CLINIC_OR_DEPARTMENT_OTHER): Payer: Medicare HMO | Attending: General Surgery

## 2013-01-17 DIAGNOSIS — L8992 Pressure ulcer of unspecified site, stage 2: Secondary | ICD-10-CM | POA: Insufficient documentation

## 2013-01-17 DIAGNOSIS — L89609 Pressure ulcer of unspecified heel, unspecified stage: Secondary | ICD-10-CM | POA: Insufficient documentation

## 2013-01-18 ENCOUNTER — Telehealth: Payer: Self-pay | Admitting: Internal Medicine

## 2013-01-18 NOTE — Telephone Encounter (Signed)
Contacted patient's son.  He had sent in log of his dad's BP  Range from Jan 20 to Feb 18.   BE at 10:30 AM  122 to 188/70s-80s  At mid day BP 120 to 170    In late afernoon BP 120 to 178/90 to 112 BP on last days recored overall were less labie (140 to 180/70 to98.  Avg 150/80  Syncopal episodes have all  occ in AM after breakfast while sitting  He has not had any since I saw him  Son reports he is now on a DA agonist.  Dr. 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  With new med is Doing better.  BP less labile.  More alert  Rec;  If SBP over 175 can give another 1/2 Hydralazine

## 2013-01-27 ENCOUNTER — Emergency Department (HOSPITAL_COMMUNITY): Payer: Medicare HMO

## 2013-01-27 ENCOUNTER — Inpatient Hospital Stay (HOSPITAL_COMMUNITY)
Admission: EM | Admit: 2013-01-27 | Discharge: 2013-02-03 | DRG: 690 | Disposition: A | Discharge status: Home Health | Payer: Medicare HMO | Attending: Internal Medicine | Admitting: Internal Medicine

## 2013-01-27 DIAGNOSIS — R1310 Dysphagia, unspecified: Secondary | ICD-10-CM | POA: Diagnosis present

## 2013-01-27 DIAGNOSIS — R531 Weakness: Secondary | ICD-10-CM

## 2013-01-27 DIAGNOSIS — K589 Irritable bowel syndrome without diarrhea: Secondary | ICD-10-CM | POA: Diagnosis present

## 2013-01-27 DIAGNOSIS — I498 Other specified cardiac arrhythmias: Secondary | ICD-10-CM

## 2013-01-27 DIAGNOSIS — K623 Rectal prolapse: Secondary | ICD-10-CM

## 2013-01-27 DIAGNOSIS — M109 Gout, unspecified: Secondary | ICD-10-CM

## 2013-01-27 DIAGNOSIS — M1990 Unspecified osteoarthritis, unspecified site: Secondary | ICD-10-CM | POA: Diagnosis present

## 2013-01-27 DIAGNOSIS — R5381 Other malaise: Secondary | ICD-10-CM | POA: Diagnosis present

## 2013-01-27 DIAGNOSIS — K3184 Gastroparesis: Secondary | ICD-10-CM

## 2013-01-27 DIAGNOSIS — F3289 Other specified depressive episodes: Secondary | ICD-10-CM | POA: Diagnosis present

## 2013-01-27 DIAGNOSIS — F0280 Dementia in other diseases classified elsewhere without behavioral disturbance: Secondary | ICD-10-CM | POA: Diagnosis present

## 2013-01-27 DIAGNOSIS — Z8673 Personal history of transient ischemic attack (TIA), and cerebral infarction without residual deficits: Secondary | ICD-10-CM

## 2013-01-27 DIAGNOSIS — N319 Neuromuscular dysfunction of bladder, unspecified: Secondary | ICD-10-CM | POA: Diagnosis present

## 2013-01-27 DIAGNOSIS — R1319 Other dysphagia: Secondary | ICD-10-CM | POA: Diagnosis present

## 2013-01-27 DIAGNOSIS — I10 Essential (primary) hypertension: Secondary | ICD-10-CM

## 2013-01-27 DIAGNOSIS — R5383 Other fatigue: Secondary | ICD-10-CM

## 2013-01-27 DIAGNOSIS — Z79899 Other long term (current) drug therapy: Secondary | ICD-10-CM

## 2013-01-27 DIAGNOSIS — G20 Parkinson's disease: Secondary | ICD-10-CM

## 2013-01-27 DIAGNOSIS — Z8669 Personal history of other diseases of the nervous system and sense organs: Secondary | ICD-10-CM

## 2013-01-27 DIAGNOSIS — J189 Pneumonia, unspecified organism: Secondary | ICD-10-CM

## 2013-01-27 DIAGNOSIS — Z86718 Personal history of other venous thrombosis and embolism: Secondary | ICD-10-CM

## 2013-01-27 DIAGNOSIS — B9689 Other specified bacterial agents as the cause of diseases classified elsewhere: Secondary | ICD-10-CM | POA: Diagnosis present

## 2013-01-27 DIAGNOSIS — H3530 Unspecified macular degeneration: Secondary | ICD-10-CM | POA: Diagnosis present

## 2013-01-27 DIAGNOSIS — G988 Other disorders of nervous system: Secondary | ICD-10-CM

## 2013-01-27 DIAGNOSIS — Z8719 Personal history of other diseases of the digestive system: Secondary | ICD-10-CM

## 2013-01-27 DIAGNOSIS — R55 Syncope and collapse: Secondary | ICD-10-CM

## 2013-01-27 DIAGNOSIS — N318 Other neuromuscular dysfunction of bladder: Secondary | ICD-10-CM | POA: Diagnosis present

## 2013-01-27 DIAGNOSIS — I639 Cerebral infarction, unspecified: Secondary | ICD-10-CM

## 2013-01-27 DIAGNOSIS — R627 Adult failure to thrive: Secondary | ICD-10-CM | POA: Diagnosis present

## 2013-01-27 DIAGNOSIS — IMO0002 Reserved for concepts with insufficient information to code with codable children: Secondary | ICD-10-CM | POA: Diagnosis present

## 2013-01-27 DIAGNOSIS — N390 Urinary tract infection, site not specified: Principal | ICD-10-CM | POA: Diagnosis present

## 2013-01-27 DIAGNOSIS — K5900 Constipation, unspecified: Secondary | ICD-10-CM | POA: Diagnosis present

## 2013-01-27 DIAGNOSIS — N400 Enlarged prostate without lower urinary tract symptoms: Secondary | ICD-10-CM

## 2013-01-27 DIAGNOSIS — R339 Retention of urine, unspecified: Secondary | ICD-10-CM | POA: Diagnosis present

## 2013-01-27 DIAGNOSIS — M810 Age-related osteoporosis without current pathological fracture: Secondary | ICD-10-CM | POA: Diagnosis present

## 2013-01-27 DIAGNOSIS — Z66 Do not resuscitate: Secondary | ICD-10-CM | POA: Diagnosis present

## 2013-01-27 DIAGNOSIS — R4182 Altered mental status, unspecified: Secondary | ICD-10-CM

## 2013-01-27 DIAGNOSIS — E039 Hypothyroidism, unspecified: Secondary | ICD-10-CM | POA: Diagnosis present

## 2013-01-27 DIAGNOSIS — F0390 Unspecified dementia without behavioral disturbance: Secondary | ICD-10-CM

## 2013-01-27 DIAGNOSIS — H269 Unspecified cataract: Secondary | ICD-10-CM | POA: Diagnosis present

## 2013-01-27 DIAGNOSIS — G238 Other specified degenerative diseases of basal ganglia: Secondary | ICD-10-CM | POA: Diagnosis present

## 2013-01-27 DIAGNOSIS — G309 Alzheimer's disease, unspecified: Secondary | ICD-10-CM | POA: Diagnosis present

## 2013-01-27 LAB — CBC WITH DIFFERENTIAL/PLATELET
Basophils Relative: 0 % (ref 0–1)
HCT: 40.1 % (ref 39.0–52.0)
Hemoglobin: 13.1 g/dL (ref 13.0–17.0)
Lymphocytes Relative: 19 % (ref 12–46)
Lymphs Abs: 1.5 10*3/uL (ref 0.7–4.0)
Monocytes Absolute: 0.7 10*3/uL (ref 0.1–1.0)
Monocytes Relative: 9 % (ref 3–12)
Neutro Abs: 5.8 10*3/uL (ref 1.7–7.7)
Neutrophils Relative %: 71 % (ref 43–77)
RBC: 4.58 MIL/uL (ref 4.22–5.81)
WBC: 8.2 10*3/uL (ref 4.0–10.5)

## 2013-01-27 LAB — URINALYSIS, ROUTINE W REFLEX MICROSCOPIC
Bilirubin Urine: NEGATIVE
Ketones, ur: NEGATIVE mg/dL
Nitrite: POSITIVE — AB
Protein, ur: 100 mg/dL — AB
pH: 7 (ref 5.0–8.0)

## 2013-01-27 LAB — MAGNESIUM: Magnesium: 2 mg/dL (ref 1.5–2.5)

## 2013-01-27 LAB — COMPREHENSIVE METABOLIC PANEL
Albumin: 3.6 g/dL (ref 3.5–5.2)
Alkaline Phosphatase: 93 U/L (ref 39–117)
BUN: 12 mg/dL (ref 6–23)
CO2: 26 mEq/L (ref 19–32)
Chloride: 101 mEq/L (ref 96–112)
GFR calc non Af Amer: 70 mL/min — ABNORMAL LOW (ref >90)
Glucose, Bld: 99 mg/dL (ref 70–99)
Potassium: 4.1 mEq/L (ref 3.5–5.1)
Total Bilirubin: 0.5 mg/dL (ref 0.3–1.2)

## 2013-01-27 LAB — LACTIC ACID, PLASMA: Lactic Acid, Venous: 2.7 mmol/L — ABNORMAL HIGH (ref 0.5–2.2)

## 2013-01-27 LAB — PROTIME-INR: Prothrombin Time: 14.8 seconds (ref 11.6–15.2)

## 2013-01-27 LAB — CBC
MCH: 28.1 pg (ref 26.0–34.0)
Platelets: 213 10*3/uL (ref 150–400)
RBC: 4.37 MIL/uL (ref 4.22–5.81)

## 2013-01-27 LAB — CREATININE, SERUM: Creatinine, Ser: 0.85 mg/dL (ref 0.50–1.35)

## 2013-01-27 MED ORDER — ROTIGOTINE 3 MG/24HR TD PT24: 1.0000 | MEDICATED_PATCH | Freq: Every day | TRANSDERMAL | Status: DC

## 2013-01-27 MED ORDER — CRANBERRY 500 MG PO CHEW: 1.0000 | CHEWABLE_TABLET | Freq: Every day | ORAL | Status: DC

## 2013-01-27 MED ORDER — HEPARIN SODIUM (PORCINE) 5000 UNIT/ML IJ SOLN
5000.0000 [IU] | Freq: Three times a day (TID) | INTRAMUSCULAR | Status: DC
  Administered 2013-01-27 – 2013-02-03 (×21): 5000 [IU] via SUBCUTANEOUS
  Filled 2013-01-27 (×25): qty 1

## 2013-01-27 MED ORDER — ONDANSETRON HCL 4 MG/2ML IJ SOLN: 4.0000 mg | Freq: Four times a day (QID) | INTRAMUSCULAR | Status: DC | PRN

## 2013-01-27 MED ORDER — POLYETHYLENE GLYCOL 3350 17 G PO PACK
17.0000 g | PACK | Freq: Every day | ORAL | Status: DC
  Administered 2013-01-27 – 2013-02-03 (×7): 17 g via ORAL
  Filled 2013-01-27 (×8): qty 1

## 2013-01-27 MED ORDER — CALCIUM CARBONATE-VITAMIN D 500-200 MG-UNIT PO TABS
1.0000 | ORAL_TABLET | Freq: Two times a day (BID) | ORAL | Status: DC
  Administered 2013-01-27 – 2013-02-03 (×14): 1 via ORAL
  Filled 2013-01-27 (×16): qty 1

## 2013-01-27 MED ORDER — SODIUM CHLORIDE 0.9 % IV BOLUS (SEPSIS)
1250.0000 mL | Freq: Once | INTRAVENOUS | Status: AC
  Administered 2013-01-27: 1250 mL via INTRAVENOUS

## 2013-01-27 MED ORDER — ONDANSETRON HCL 4 MG PO TABS: 4.0000 mg | ORAL_TABLET | Freq: Four times a day (QID) | ORAL | Status: DC | PRN

## 2013-01-27 MED ORDER — SODIUM CHLORIDE 0.9 % IJ SOLN: 3.0000 mL | INTRAMUSCULAR | Status: DC | PRN

## 2013-01-27 MED ORDER — MORPHINE SULFATE 2 MG/ML IJ SOLN: 2.0000 mg | INTRAMUSCULAR | Status: DC | PRN

## 2013-01-27 MED ORDER — SODIUM CHLORIDE 0.9 % IV SOLN
INTRAVENOUS | Status: DC
  Administered 2013-01-27 – 2013-02-02 (×6): via INTRAVENOUS
  Administered 2013-02-03: 50 mL/h via INTRAVENOUS

## 2013-01-27 MED ORDER — ATORVASTATIN CALCIUM 10 MG PO TABS
10.0000 mg | ORAL_TABLET | Freq: Every day | ORAL | Status: DC
  Administered 2013-01-27 – 2013-02-02 (×7): 10 mg via ORAL
  Filled 2013-01-27 (×8): qty 1

## 2013-01-27 MED ORDER — SODIUM CHLORIDE 0.9 % IJ SOLN
3.0000 mL | Freq: Two times a day (BID) | INTRAMUSCULAR | Status: DC
  Administered 2013-01-27 – 2013-02-02 (×9): 3 mL via INTRAVENOUS

## 2013-01-27 MED ORDER — VITAMIN B-12 1000 MCG PO TABS
1000.0000 ug | ORAL_TABLET | Freq: Every day | ORAL | Status: DC
  Administered 2013-01-28 – 2013-02-03 (×7): 1000 ug via ORAL
  Filled 2013-01-27 (×7): qty 1

## 2013-01-27 MED ORDER — SODIUM CHLORIDE 0.9 % IV SOLN: 250.0000 mL | INTRAVENOUS | Status: DC | PRN

## 2013-01-27 MED ORDER — ACETAMINOPHEN 500 MG PO TABS
500.0000 mg | ORAL_TABLET | Freq: Two times a day (BID) | ORAL | Status: DC
  Administered 2013-01-27 – 2013-02-03 (×15): 500 mg via ORAL
  Filled 2013-01-27 (×16): qty 1

## 2013-01-27 MED ORDER — HYDRALAZINE HCL 25 MG PO TABS
12.5000 mg | ORAL_TABLET | Freq: Two times a day (BID) | ORAL | Status: DC
  Administered 2013-01-27 – 2013-01-29 (×4): 12.5 mg via ORAL
  Filled 2013-01-27 (×5): qty 0.5

## 2013-01-27 MED ORDER — FERROUS SULFATE 325 (65 FE) MG PO TABS
325.0000 mg | ORAL_TABLET | Freq: Every day | ORAL | Status: DC
  Administered 2013-01-28 – 2013-02-03 (×6): 325 mg via ORAL
  Filled 2013-01-27 (×8): qty 1

## 2013-01-27 MED ORDER — BOOST PO LIQD
0.5000 | Freq: Two times a day (BID) | ORAL | Status: DC
  Administered 2013-01-27: 119 mL via ORAL
  Administered 2013-01-28: 11:00:00 via ORAL
  Administered 2013-01-29 – 2013-02-01 (×4): 119 mL via ORAL
  Administered 2013-02-02: 11:00:00 via ORAL
  Administered 2013-02-02 – 2013-02-03 (×2): 119 mL via ORAL
  Filled 2013-01-27 (×17): qty 237

## 2013-01-27 MED ORDER — MEMANTINE HCL 10 MG PO TABS
10.0000 mg | ORAL_TABLET | Freq: Two times a day (BID) | ORAL | Status: DC
  Administered 2013-01-27 – 2013-02-03 (×14): 10 mg via ORAL
  Filled 2013-01-27 (×15): qty 1

## 2013-01-27 MED ORDER — CALTRATE 600+D PLUS MINERALS 600-800 MG-UNIT PO CHEW: 1.0000 | CHEWABLE_TABLET | Freq: Two times a day (BID) | ORAL | Status: DC

## 2013-01-27 MED ORDER — DEXTROSE 5 % IV SOLN
1.0000 g | Freq: Once | INTRAVENOUS | Status: AC
  Administered 2013-01-27: 1 g via INTRAVENOUS
  Filled 2013-01-27: qty 10

## 2013-01-27 MED ORDER — OXYCODONE HCL 5 MG PO TABS: 5.0000 mg | ORAL_TABLET | ORAL | Status: DC | PRN

## 2013-01-27 MED ORDER — ADULT MULTIVITAMIN W/MINERALS CH
1.0000 | ORAL_TABLET | Freq: Every day | ORAL | Status: DC
  Administered 2013-01-28 – 2013-02-03 (×7): 1 via ORAL
  Filled 2013-01-27 (×8): qty 1

## 2013-01-27 MED ORDER — LEVOTHYROXINE SODIUM 25 MCG PO TABS
25.0000 ug | ORAL_TABLET | Freq: Every day | ORAL | Status: DC
  Administered 2013-01-27 – 2013-02-03 (×8): 25 ug via ORAL
  Filled 2013-01-27 (×10): qty 1

## 2013-01-27 NOTE — ED Notes (Signed)
Pt from home via EMS, Per Son, Pt has been increasingly fatigued for the last three days, is more unresponsive this morning, unable to communicate. Per EMS pt. Answered some questions en route. Per son Pt has hx of TIA w/ similar presentation

## 2013-01-27 NOTE — ED Provider Notes (Signed)
History     CSN: [id number]  Arrival date & time 01/27/13  1004   First MD Initiated Contact with Patient 01/27/13 1010      Chief Complaint  Patient presents with  . Altered Mental Status    (Consider location/radiation/quality/duration/timing/severity/associated sxs/prior treatment) HPI Comments: Mr. Gatt presents with his son for evaluation.  He has not been as active as usual and his son is concerned he may be developing an infection.  He reports he has not been febrile but his temperature has been slightly higher than usual.  He has not been able to assist himself with sitting upright or transferring himself from the bed to the chair.  He has not been speaking as much or making as much eye contact and appears to just slump.  His son reports it took him great effort to eat today and he could barely lift a cup to his lips to drink.  He denies having any pain and his son has noted no rashes, respiratory difficulty, NVD, melena, or episodes of fainting/syncope.  Patient is a 79 y.o. male presenting with altered mental status. The history is provided by a relative. The history is limited by the condition of the patient (pt has a chronic degenerative neurologic condition and dementia).  Altered Mental Status This is a new problem. The current episode started 2 days ago. The problem occurs constantly. The problem has been gradually worsening. Pertinent negatives include no chest pain, no abdominal pain, no headaches and no shortness of breath. Nothing aggravates the symptoms. Nothing relieves the symptoms.    Past Medical History  Diagnosis Date  . IBS (irritable bowel syndrome)   . Diverticulosis   . Cataract   . OAB (overactive bladder)   . Macular degeneration   . Alzheimer disease   . Hypertension   . Arthritis   . Osteoporosis   . Parkinsonian syndrome   . Syncope, vasovagal   . Neuromuscular disorder     parkisonian syndrome  . Dementia due to Parkinson's disease without  behavioral disturbance   . Unspecified transient cerebral ischemia   . Unspecified urinary incontinence   . Senile dementia, uncomplicated   . Unspecified constipation   . Hypothyroidism   . Anemia, unspecified   . Depression   . Alzheimer's disease   . Gout, unspecified   . Thoracic or lumbosacral neuritis or radiculitis, unspecified     Past Surgical History  Procedure Laterality Date  . Esophagogastroduodenoscopy    . Eye surgery  2006    LEFT CATARACT    Family History  Problem Relation Age of Onset  . Cancer Mother   . Stroke Father   . Liver disease Father   . Kidney disease Brother   . Liver disease Brother   . Emphysema Brother     History  Substance Use Topics  . Smoking status: Never Smoker   . Smokeless tobacco: Never Used  . Alcohol Use: No      Review of Systems  Unable to perform ROS: Dementia  Respiratory: Negative for shortness of breath.   Cardiovascular: Negative for chest pain.  Gastrointestinal: Negative for abdominal pain.  Neurological: Negative for headaches.  Psychiatric/Behavioral: Positive for altered mental status.    Allergies  Review of patient's allergies indicates no known allergies.  Home Medications   Current Outpatient Rx  Name  Route  Sig  Dispense  Refill  . acetaminophen (TYLENOL) 500 MG tablet   Oral   Take 500 mg by mouth 2 (  two) times daily.         Marland Kitchen atorvastatin (LIPITOR) 10 MG tablet   Oral   Take 1 tablet (10 mg total) by mouth daily at 6 PM.   30 tablet   0   . Calcium Carbonate-Vit D-Min (CALTRATE 600+D PLUS) 600-800 MG-UNIT CHEW   Oral   Chew 1 tablet by mouth 2 (two) times daily with a meal.          . Cranberry 500 MG CHEW   Oral   Chew 1 tablet by mouth daily.         . ferrous sulfate 325 (65 FE) MG tablet   Oral   Take 325 mg by mouth daily with breakfast.         . hydrALAZINE (APRESOLINE) 25 MG tablet   Oral   Take 12.5 mg by mouth 2 (two) times daily.          Marland Kitchen  levothyroxine (SYNTHROID, LEVOTHROID) 25 MCG tablet   Oral   Take 25 mcg by mouth daily.         . memantine (NAMENDA) 10 MG tablet   Oral   Take 10 mg by mouth 2 (two) times daily.          . Multiple Vitamins-Minerals (ICAPS AREDS FORMULA PO)   Oral   Take 1 capsule by mouth 2 (two) times daily with a meal.          . Rotigotine 3 MG/24HR PT24   Transdermal   Place 1 patch onto the skin daily.         . cetirizine (ZYRTEC) 10 MG tablet   Oral   Take 10 mg by mouth daily as needed. For allergies         . Cyanocobalamin (VITAMIN B-12 PO)   Oral   Take 5,000 mcg by mouth daily.         Marland Kitchen lactose free nutrition (BOOST) LIQD   Oral   Take 0.5 Containers by mouth 2 (two) times daily between meals.          Marland Kitchen MELATONIN EXTRA STRENGTH PO   Oral   Take 15 mLs by mouth at bedtime as needed. To help sleep         . Multiple Vitamin (MULTIVITAMIN WITH MINERALS) TABS   Oral   Take 1 tablet by mouth daily.         . polyethylene glycol (MIRALAX / GLYCOLAX) packet   Oral   Take 17 g by mouth daily.         . ranitidine (ZANTAC) 150 MG tablet   Oral   Take 150 mg by mouth daily.         . Tamsulosin HCl (FLOMAX) 0.4 MG CAPS   Oral   Take 0.4 mg by mouth daily at 12 noon. 1/2 hour after lunch           BP 156/85  Pulse 70  Temp(Src) 99.2 F (37.3 C) (Oral)  Resp 20  SpO2 94%  Physical Exam  Nursing note and vitals reviewed. Constitutional: He appears well-nourished. He appears lethargic. No distress.  HENT:  Head: Normocephalic and atraumatic.  Right Ear: External ear normal.  Left Ear: External ear normal.  Nose: Nose normal.  Mouth/Throat: Oropharynx is clear and moist. No oropharyngeal exudate.  Eyes: Conjunctivae are normal. Pupils are equal, round, and reactive to light. Right eye exhibits no discharge. Left eye exhibits no discharge. No scleral icterus.  Neck: Trachea normal.  No JVD present. No tracheal tenderness, no spinous process  tenderness and no muscular tenderness present. Carotid bruit is not present. Decreased range of motion (pt's hed is held tilted forward.  Unable to extend the neck (not an acute finding).  No meningismus encountered.) present. No tracheal deviation, no edema and no erythema present.  Cardiovascular: Normal rate, regular rhythm, intact distal pulses and normal pulses.   No extrasystoles are present. Exam reveals distant heart sounds. Exam reveals no gallop and no friction rub.   Murmur heard. Pulmonary/Chest: Effort normal. No stridor. No respiratory distress. He has no wheezes. He has no rales. He exhibits no tenderness.  Breath sounds diffusely diminished  Abdominal: Soft. Bowel sounds are normal. He exhibits no distension and no mass. There is no tenderness. There is no rebound and no guarding.  Musculoskeletal: He exhibits edema. He exhibits no tenderness.  Lymphadenopathy:    He has no cervical adenopathy.  Neurological: He appears lethargic. He is disoriented. He displays atrophy and tremor. He exhibits abnormal muscle tone. He displays no Babinski's sign on the right side.  Skin: Skin is warm and dry. No rash noted. He is not diaphoretic. No erythema. No pallor.  Psychiatric: His speech is delayed. He is slowed and withdrawn. He exhibits a depressed mood.    ED Course  Procedures (including critical care time)  Labs Reviewed  URINE CULTURE  CBC WITH DIFFERENTIAL  COMPREHENSIVE METABOLIC PANEL  LACTIC ACID, PLASMA  URINALYSIS, ROUTINE W REFLEX MICROSCOPIC  PROTIME-INR   No results found.   No diagnosis found.   Date: 01/27/2013 @ 1049  Rate: 72 bpm  Rhythm: accelerated junctial rhythm  QRS Axis: left  Intervals:    ST/T Wave abnormalities: inf and ant T inversions  Conduction Disutrbances:nonspecific intraventricular conduction delay, LVH, RBBB  Narrative Interpretation:   Old EKG Reviewed: unchanged      MDM  Pt presents for evaluation of mental status changes.  He  appears nontoxic, note stable VS, NAD.  He is less active than at his baseline and has been unable to help family i the performance of ADLs for the last 2-3 days.  Will obtain basic labs, U/A and urine cx, CXR, and EKG.  Will reassess as the results become available.  He is currently awake and denies having any pain.  1315.  Pt stable, NAD. Mental status has not changed.  He has no leukocytosis or significant electrolyte abnormalities.  The urinalysis is consistent with a UTI.  He does have a foley catheter in place secondary to urinary retention.  Ordered ceftriaxone.  He may be appropriate for treatment as an outpt.  I will discuss his condition with his son when he returns to the ER.  If he is able to continue providing care, will discharge home on oral antibiotics.  If however secondary to Mr. Greens's significant weakness he is unable to continue his care, will seek admission for parenteral antibiotics.    1345.  Pt will be admitted as he is unable to assist at all with any of the basic ADLs.  Discussed with the Team 9 hospitalist.     Tobin Chad, MD 01/27/13 1348

## 2013-01-27 NOTE — ED Notes (Signed)
Daughter- Ricky [name] is court appointed general guardian. Her number is [phone]

## 2013-01-27 NOTE — H&P (Signed)
PCP:   Bufford Spikes, DO   Chief Complaint:  Progressive weakness.   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 Patient's son/HPOA, [name] (tel: [phone]), who was at the bedside, supplied the history, and states that patient has experienced gradual decline for over a year, but in the past 4 days, he has not been able to assist himself with sitting upright or transferring from the bed to the chair, has not been speaking as much or making as much eye contact and appears to "just slump". It took patient great effort to eat today and he could barely lift a cup to his lips to drink, and he appeared quite lethargic. Over the past 3 weeks, patient has had an increasing cough, and progressive difficulty swallowing liquids. He consumes a regular diet, but right now, family is attempting to give him thicker liquids, in the hope that he will tolerate these better. He has had no fever, or shortness of breath. , and since 11/30/12, has had indwelling Foley catheter, for urinary retention, having failed a few voiding trial. In the ED, urinalysis was found to be positive for pyuria/bacteriuria.     Allergies:  No Known Allergies    Past Medical History  Diagnosis Date  . IBS (irritable bowel syndrome)   . Diverticulosis   . Cataract   . OAB (overactive bladder)   . Macular degeneration   . Alzheimer disease   . Hypertension   . Arthritis   . Osteoporosis   . Parkinsonian syndrome   . Syncope, vasovagal   . Neuromuscular disorder     parkisonian syndrome  . 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 Unspecified transient cerebral ischemia    . Unspecified urinary incontinence   . Senile dementia, uncomplicated   . Unspecified constipation   . Hypothyroidism   . Anemia, unspecified   . Depression   . Alzheimer's disease   . Gout, unspecified   . Thoracic or lumbosacral neuritis or radiculitis, unspecified     Past Surgical History  Procedure Laterality Date  . Esophagogastroduodenoscopy    . Eye surgery  2006    LEFT CATARACT    Prior to Admission medications   Medication Sig Start Date End Date Taking? Authorizing Abi Shoults  acetaminophen (TYLENOL) 500 MG tablet Take 500 mg by mouth 2 (two) times daily.   Yes Historical Sasuke Yaffe, MD  atorvastatin (LIPITOR) 10 MG tablet Take 1 tablet (10 mg total) by mouth daily at 6 PM. 06/24/12 06/24/13 Yes Joseph Art, DO  Calcium Carbonate-Vit D-Min (CALTRATE 600+D PLUS) 600-800 MG-UNIT CHEW Chew 1 tablet by mouth 2 (two) times daily with a meal.    Yes Historical Mikaila Grunert, MD  cetirizine (ZYRTEC) 10 MG tablet Take 10 mg by mouth daily as needed. For allergies   Yes Historical Nubia Ziesmer, MD  Cranberry 500 MG CHEW Chew 1 tablet by mouth daily.   Yes Historical Catalia Massett, MD  Cyanocobalamin (VITAMIN B-12 PO) Take 5,000 mcg by mouth daily.   Yes Historical Moises Terpstra, MD  ferrous sulfate 325 (65 FE) MG tablet Take 325 mg by mouth daily with breakfast.   Yes Historical Beatrice Sehgal, MD  guaiFENesin (MUCINEX) 600 MG 12 hr tablet Take 1,200 mg by mouth 2 (two) times daily as needed  for congestion.   Yes Historical Dontasia Miranda, MD  hydrALAZINE (APRESOLINE) 25 MG tablet Take 12.5 mg by mouth 2 (two) times daily.    Yes Historical Roben Schliep, MD  lactose free nutrition (BOOST) LIQD Take 0.5 Containers by mouth 2 (two) times daily between meals.    Yes Historical Joe Gee, MD  levothyroxine (SYNTHROID, LEVOTHROID) 25 MCG tablet Take 25 mcg by mouth daily.   Yes Historical Kashay Cavenaugh, MD  MELATONIN EXTRA STRENGTH PO Take 15 mLs by mouth at bedtime as needed. To help sleep   Yes Historical Trinka Keshishyan, MD   memantine (NAMENDA) 10 MG tablet Take 10 mg by mouth 2 (two) times daily.    Yes Historical Tangelia Sanson, MD  Multiple Vitamin (MULTIVITAMIN WITH MINERALS) TABS Take 1 tablet by mouth daily.   Yes Historical Tekela Garguilo, MD  Multiple Vitamins-Minerals (ICAPS AREDS FORMULA PO) Take 1 capsule by mouth 2 (two) times daily with a meal.    Yes Historical Evagelia Knack, MD  polyethylene glycol (MIRALAX / GLYCOLAX) packet Take 17 g by mouth daily.   Yes Historical Jaymir Struble, MD  Rotigotine 3 MG/24HR PT24 Place 1 patch onto the skin daily.   Yes Historical Makylah Bossard, MD    Social History: Patient reports that he has never smoked. He has never used smokeless tobacco. He reports that he does not drink alcohol or use illicit drugs. Patient has HHRN, that comes in every morning.   Family History  Problem Relation Age of Onset  . Cancer Mother   . Stroke Father   . Liver disease Father   . Kidney disease Brother   . Liver disease Brother   . Emphysema Brother     Review of Systems:  As per HPI and chief complaint. Difficult to obtain, as patient is lethargic, but he denies fever, chills, headache, blurred vision, difficulty in speaking, chest pain, shortness of breath, orthopnea, paroxysmal nocturnal dyspnea, nausea, diaphoresis, abdominal pain, vomiting, diarrhea, belching, heartburn, hematemesis, melena, dysuria, nocturia, urinary frequency, hematochezia, lower extremity swelling, pain, or redness. The rest of the systems review is negative.  Physical Exam:  General:  Patient does not appear to be in obvious acute distress. He is somnolent, but when roused, is able to answer yes or no to questions and follow simple commands. Not short of breath at rest. Hydration status appears fair.  HEENT:  No clinical pallor, no jaundice, no conjunctival injection or discharge. NECK:  Supple, JVP not seen, no carotid bruits, no palpable lymphadenopathy, no palpable goiter. CHEST:  Clinically clear to auscultation, no  wheezes, no crackles. Few large airway noises on left.  HEART:  Sounds 1 and 2 heard, normal, regular, no murmurs. ABDOMEN:  Full, soft, non-tender, no palpable organomegaly, no palpable masses, normal bowel sounds. GENITALIA:  Not examined. LOWER EXTREMITIES:  No pitting edema, has stasis eczema, palpable peripheral pulses. LLE is in heel lift MUSCULOSKELETAL SYSTEM:  Generalized osteoarthritic changes, otherwise, normal. CENTRAL NERVOUS SYSTEM:  Difficult to examine, but patient is moving all limbs, although there is some rigidity in both UE. .   Labs on Admission:  Results for orders placed during the hospital encounter of 01/27/13 (from the past 48 hour(s))  CBC WITH DIFFERENTIAL     Status: None   Collection Time    01/27/13 10:55 AM      Result Value Range   WBC 8.2  4.0 - 10.5 K/uL   RBC 4.58  4.22 - 5.81 MIL/uL   Hemoglobin 13.1  13.0 - 17.0 g/dL   HCT 16.1  09.6 -  52.0 %   MCV 87.6  78.0 - 100.0 fL   MCH 28.6  26.0 - 34.0 pg   MCHC 32.7  30.0 - 36.0 g/dL   RDW 16.1  09.6 - 04.5 %   Platelets 221  150 - 400 K/uL   Neutrophils Relative 71  43 - 77 %   Neutro Abs 5.8  1.7 - 7.7 K/uL   Lymphocytes Relative 19  12 - 46 %   Lymphs Abs 1.5  0.7 - 4.0 K/uL   Monocytes Relative 9  3 - 12 %   Monocytes Absolute 0.7  0.1 - 1.0 K/uL   Eosinophils Relative 1  0 - 5 %   Eosinophils Absolute 0.1  0.0 - 0.7 K/uL   Basophils Relative 0  0 - 1 %   Basophils Absolute 0.0  0.0 - 0.1 K/uL  COMPREHENSIVE METABOLIC PANEL     Status: Abnormal   Collection Time    01/27/13 10:55 AM      Result Value Range   Sodium 139  135 - 145 mEq/L   Potassium 4.1  3.5 - 5.1 mEq/L   Chloride 101  96 - 112 mEq/L   CO2 26  19 - 32 mEq/L   Glucose, Bld 99  70 - 99 mg/dL   BUN 12  6 - 23 mg/dL   Creatinine, Ser 4.09  0.50 - 1.35 mg/dL   Calcium 9.7  8.4 - 81.1 mg/dL   Total Protein 8.6 (*) 6.0 - 8.3 g/dL   Albumin 3.6  3.5 - 5.2 g/dL   AST 19  0 - 37 U/L   ALT 15  0 - 53 U/L   Alkaline Phosphatase 93   39 - 117 U/L   Total Bilirubin 0.5  0.3 - 1.2 mg/dL   GFR calc non Af Amer 70 (*) >90 mL/min   GFR calc Af Amer 82 (*) >90 mL/min   Comment:            The eGFR has been calculated     using the CKD EPI equation.     This calculation has not been     validated in all clinical     situations.     eGFR's persistently     <90 mL/min signify     possible Chronic Kidney Disease.  LACTIC ACID, PLASMA     Status: Abnormal   Collection Time    01/27/13 10:55 AM      Result Value Range   Lactic Acid, Venous 2.7 (*) 0.5 - 2.2 mmol/L  PROTIME-INR     Status: None   Collection Time    01/27/13 10:55 AM      Result Value Range   Prothrombin Time 14.8  11.6 - 15.2 seconds   INR 1.18  0.00 - 1.49  URINALYSIS, ROUTINE W REFLEX MICROSCOPIC     Status: Abnormal   Collection Time    01/27/13 12:00 PM      Result Value Range   Color, Urine YELLOW  YELLOW   APPearance TURBID (*) CLEAR   Specific Gravity, Urine 1.019  1.005 - 1.030   pH 7.0  5.0 - 8.0   Glucose, UA NEGATIVE  NEGATIVE mg/dL   Hgb urine dipstick LARGE (*) NEGATIVE   Bilirubin Urine NEGATIVE  NEGATIVE   Ketones, ur NEGATIVE  NEGATIVE mg/dL   Protein, ur 914 (*) NEGATIVE mg/dL   Urobilinogen, UA 1.0  0.0 - 1.0 mg/dL   Nitrite POSITIVE (*) NEGATIVE  Leukocytes, UA LARGE (*) NEGATIVE  URINE MICROSCOPIC-ADD ON     Status: Abnormal   Collection Time    01/27/13 12:00 PM      Result Value Range   Squamous Epithelial / LPF FEW (*) RARE   WBC, UA TOO NUMEROUS TO COUNT  <3 WBC/hpf   RBC / HPF 11-20  <3 RBC/hpf   Bacteria, UA MANY (*) RARE   Casts GRANULAR CAST (*) NEGATIVE    Radiological Exams on Admission: Dg Chest 1 View  01/27/2013  *RADIOLOGY REPORT*  Clinical Data: Altered mental status.  CHEST - 1 VIEW  Comparison: 09/07/2012.  Findings: Trachea is midline.  Heart is enlarged, stable.  Thoracic aorta is calcified.  Mild chronic changes are seen in the lungs without air space consolidation or pleural fluid. Probable nodular  summation shadow in the right suprahilar region.  IMPRESSION:  1.  No acute findings. 2.  Probable nodular summation shadow in the right suprahilar region.  Continued attention on follow-up exams is warranted.   Original Report Authenticated By: Leanna Battles, M.D.     Assessment/Plan Active Problems:   1. FTT (failure to thrive) in adult: Patient presented with progressive weakness over some months, culminating in lethargy, poor oral intake in the past 4 days. This is likely multi-factorial, due to progressive neurologic disease, dementia and UTI. Will admit and manage as outlined below.  2. UTI: Urinalysis done in the ED, revealed significant pyuruia and bacteriuria. ED MD has already commenced iv rocephin, which we shall continue, pending urine cultures.  3. Neurological disease: Patient has had a progressive neurologic disease for years, initially diagnosed as parkinson-like syndrome, but more recently, confirmed by Dr Fayrene Fearing love, to be progressive supranuclear palsy. He will benefit from PT/OT evaluation, during this hospitalization. 4. Dysphagia: Likely secondary to # 3. Fortunately, CXR is devoid of acute disease. Will consult SLP. 5. Dementia: Stable. Will continue  Namenda. 6. Hypothyroidism: Continued on Synthroid. Check TSH.   Further management will depend on clinical course.   Comment: Discussed with son/HPOA, [name] in ED, and he has confirmed that patient is DNR/DNI. (Tel: [phone]).    Time Spent on Admission: 1 hour.   OTI,CHRISTOPHER 01/27/2013, 3:24 PM

## 2013-01-27 NOTE — Progress Notes (Signed)
Was unable to contact admissions nurse after several attempts. Vassie Banker) stated that she would follow up on 01/28/13. Maxie Better

## 2013-01-28 DIAGNOSIS — R1310 Dysphagia, unspecified: Secondary | ICD-10-CM

## 2013-01-28 DIAGNOSIS — F0390 Unspecified dementia without behavioral disturbance: Secondary | ICD-10-CM

## 2013-01-28 DIAGNOSIS — N390 Urinary tract infection, site not specified: Secondary | ICD-10-CM | POA: Diagnosis present

## 2013-01-28 DIAGNOSIS — I498 Other specified cardiac arrhythmias: Secondary | ICD-10-CM

## 2013-01-28 DIAGNOSIS — E039 Hypothyroidism, unspecified: Secondary | ICD-10-CM

## 2013-01-28 LAB — CBC
HCT: 34.8 % — ABNORMAL LOW (ref 39.0–52.0)
Hemoglobin: 11.1 g/dL — ABNORMAL LOW (ref 13.0–17.0)
MCH: 27.7 pg (ref 26.0–34.0)
MCHC: 31.9 g/dL (ref 30.0–36.0)

## 2013-01-28 LAB — COMPREHENSIVE METABOLIC PANEL
BUN: 11 mg/dL (ref 6–23)
Calcium: 8.8 mg/dL (ref 8.4–10.5)
GFR calc Af Amer: 84 mL/min — ABNORMAL LOW (ref >90)
Glucose, Bld: 97 mg/dL (ref 70–99)
Sodium: 139 mEq/L (ref 135–145)
Total Protein: 7 g/dL (ref 6.0–8.3)

## 2013-01-28 LAB — MRSA PCR SCREENING: MRSA by PCR: NEGATIVE

## 2013-01-28 LAB — URINE CULTURE: Colony Count: >=100000

## 2013-01-28 MED ORDER — ROTIGOTINE 3 MG/24HR TD PT24
3.0000 mg | MEDICATED_PATCH | Freq: Every day | TRANSDERMAL | Status: DC
  Administered 2013-01-29: 3 mg via TRANSDERMAL
  Filled 2013-01-28: qty 1

## 2013-01-28 MED ORDER — DEXTROSE 5 % IV SOLN
1.0000 g | INTRAVENOUS | Status: DC
  Administered 2013-01-28 – 2013-02-02 (×6): 1 g via INTRAVENOUS
  Filled 2013-01-28 (×8): qty 10

## 2013-01-28 NOTE — Progress Notes (Signed)
Physical Therapy Evaluation Patient Details Name: Ricky [name] MRN: [id number] DOB: 09-07-1917 Today's Date: 01/28/2013 Time: 1123-1204 PT Time Calculation (min): 41 min  PT Assessment / Plan / Recommendation Clinical Impression  Pt is a 79 yo male who presents with FTT. Pt was only oriented to self. Pt was not able to follow any one step commands to participate in skilled therapy. Pt demonstrated rigidity and loss of ROM in cervical region. Pt was not able to visually track me when cued. I do not feel at this current sate this patient will be able to participate in skilled therapy. I do recommend  OOB to chair with lift equipment.  PT to d/c patient from services and if pt becomes more responsive with attempts to [facility] then please reorder PT.  Pt is  total care and will need NH placement.    PT Assessment  Patent does not need any further PT services    Follow Up Recommendations  Other (comment) (reorder PT if pt able to participate with basic bed mobility)    Does the patient have the potential to tolerate intense rehabilitation      Barriers to Discharge        Equipment Recommendations  None recommended by PT    Recommendations for Other Services     Frequency      Precautions / Restrictions Precautions Precautions: Fall Restrictions Weight Bearing Restrictions: No   Pertinent Vitals/Pain       Mobility  Bed Mobility Bed Mobility: Rolling Left Rolling Left: 1: +2 Total assist Rolling Left: Patient Percentage: 0% Details for Bed Mobility Assistance: Pt resisted all movements. We were unsuccesssful in trying to roll in the bed. Pt verbalized that "I can't ". Pt demonstrated loss of cervical ROM and was not able to visually track me when cued. Transfers Transfers: Not assessed Ambulation/Gait Ambulation/Gait Assistance: Not tested (comment)    Exercises     PT Diagnosis:    PT Problem List:   PT Treatment Interventions:     PT Goals    Visit  Information  Last PT Received On: 01/28/13 Assistance Needed: +2    Subjective Data  Subjective: I can't move Patient Stated Goal: unable to state   Prior Functioning  Home Living Lives With: Son Available Help at Discharge: Family Additional Comments: unable to obtain history or prior level of function from the patient and no caregiver available. Prior Function Level of Independence: Needs assistance    Cognition  Cognition Overall Cognitive Status: No family/caregiver present to determine baseline cognitive functioning Arousal/Alertness: Awake/alert Orientation Level: Disoriented to;Place;Time;Situation Behavior During Session: Flat affect Cognition - Other Comments: unable to follow one step mobility commands    Extremity/Trunk Assessment Right Lower Extremity Assessment RLE ROM/Strength/Tone: Deficits;Due to impaired cognition;Unable to fully assess RLE ROM/Strength/Tone Deficits: incresed tone, unable to bend knee passively more than 20 degrees, ankle ROM WFL Left Lower Extremity Assessment LLE ROM/Strength/Tone: Deficits LLE ROM/Strength/Tone Deficits: incresed tone, unable to bend knee passively more than 20 degrees, ankle ROM WFL   Balance Balance Balance Assessed: No  End of Session PT - End of Session Activity Tolerance: Other (comment) (advanced dementia and resisted all movement)  GP     Greggory Stallion 01/28/2013, 1:01 PM

## 2013-01-28 NOTE — Progress Notes (Signed)
Utilization review completed. Bertha Stanfill, RN, BSN. 

## 2013-01-28 NOTE — Progress Notes (Signed)
Patient ID: Ricky [name]  male  QIO:[id number]    DOB: Mar 23, 1917    DOA: 01/27/2013  PCP: Bufford Spikes, DO  Assessment/Plan: Principal Problem:   UTI (urinary tract infection) - Start patient on Rocephin, he received one dose of Rocephin yesterday in ED, urine culture is positive, however sensitivities are pending  Active Problems:   FTT (failure to thrive) in adult - Progressive weakness over the months, dehydration, UTI, dementia, progressive neurological disease, if no significant improvement will likely need skilled nursing facility    Progressive Neurological disease: as #2, PT/OT evaluation was done and recommended nursing home placement    Dysphagia - Speech therapy recommending dysphagia 1 diet    Dementia continue Namenda  Hypothyroidism: Continue Synthroid   DVT Prophylaxis:  Code Status:DO NOT RESUSCITATE status   Disposition:Will likely need nursing home placement    Subjective: I'm okay per patient, contracted in upper extremities, does not appear to be in significant distress   Objective: Weight change:   Intake/Output Summary (Last 24 hours) at 01/28/13 1406 Last data filed at 01/28/13 1319  Gross per 24 hour  Intake 1029.17 ml  Output   1750 ml  Net -720.83 ml   Blood pressure 141/67, pulse 72, temperature 99.5 F (37.5 C), temperature source Oral, resp. rate 26, height 6\' 1"  (1.854 m), SpO2 97.00%.  Physical Exam: General: Alert and awake, not in any distress  CVS: S1-S2 clear Chest: clear to auscultation bilaterally, no wheezing, rales or rhonchi Abdomen: soft nontender, nondistended, normal bowel sounds, no organomegaly Extremities: no cyanosis, clubbing or edema noted bilaterally   Lab Results: Basic Metabolic Panel:  Recent Labs Lab 01/27/13 1055 01/27/13 1542 01/28/13 0520  NA 139  --  139  K 4.1  --  3.9  CL 101  --  104  CO2 26  --  28  GLUCOSE 99  --  97  BUN 12  --  11  CREATININE 0.92 0.85 0.85  CALCIUM 9.7  --  8.8   MG  --  2.0  --    Liver Function Tests:  Recent Labs Lab 01/27/13 1055 01/28/13 0520  AST 19 15  ALT 15 11  ALKPHOS 93 76  BILITOT 0.5 0.5  PROT 8.6* 7.0  ALBUMIN 3.6 2.9*   No results found for this basename: LIPASE, AMYLASE,  in the last 168 hours No results found for this basename: AMMONIA,  in the last 168 hours CBC:  Recent Labs Lab 01/27/13 1055 01/27/13 1542 01/28/13 0520  WBC 8.2 7.4 6.0  NEUTROABS 5.8  --   --   HGB 13.1 12.3* 11.1*  HCT 40.1 38.1* 34.8*  MCV 87.6 87.2 86.8  PLT 221 213 190   Cardiac Enzymes: No results found for this basename: CKTOTAL, CKMB, CKMBINDEX, TROPONINI,  in the last 168 hours BNP: No components found with this basename: POCBNP,  CBG: No results found for this basename: GLUCAP,  in the last 168 hours   Micro Results: Recent Results (from the past 240 hour(s))  CULTURE, BLOOD (ROUTINE X 2)     Status: None   Collection Time    01/27/13 11:10 AM      Result Value Range Status   Specimen Description BLOOD HAND LEFT   Final   Special Requests BOTTLES DRAWN AEROBIC ONLY 5CC   Final   Culture  Setup Time 01/27/2013 17:18   Final   Culture     Final   Value:  BLOOD CULTURE RECEIVED NO GROWTH TO DATE CULTURE WILL BE HELD FOR 5 DAYS BEFORE ISSUING A FINAL NEGATIVE REPORT   Report Status PENDING   Incomplete  CULTURE, BLOOD (ROUTINE X 2)     Status: None   Collection Time    01/27/13 11:20 AM      Result Value Range Status   Specimen Description BLOOD HAND RIGHT   Final   Special Requests BOTTLES DRAWN AEROBIC ONLY 5CC   Final   Culture  Setup Time 01/27/2013 17:18   Final   Culture     Final   Value:        BLOOD CULTURE RECEIVED NO GROWTH TO DATE CULTURE WILL BE HELD FOR 5 DAYS BEFORE ISSUING A FINAL NEGATIVE REPORT   Report Status PENDING   Incomplete  URINE CULTURE     Status: None   Collection Time    01/27/13 12:00 PM      Result Value Range Status   Specimen Description URINE, CATHETERIZED   Final   Special  Requests NONE   Final   Culture  Setup Time 01/27/2013 12:54   Final   Colony Count >=100,000 COLONIES/ML   Final   Culture     Final   Value: Multiple bacterial morphotypes present, none predominant. Suggest appropriate recollection if clinically indicated.   Report Status 01/28/2013 FINAL   Final    Studies/Results: Dg Chest 1 View  01/27/2013  *RADIOLOGY REPORT*  Clinical Data: Altered mental status.  CHEST - 1 VIEW  Comparison: 09/07/2012.  Findings: Trachea is midline.  Heart is enlarged, stable.  Thoracic aorta is calcified.  Mild chronic changes are seen in the lungs without air space consolidation or pleural fluid. Probable nodular summation shadow in the right suprahilar region.  IMPRESSION:  1.  No acute findings. 2.  Probable nodular summation shadow in the right suprahilar region.  Continued attention on follow-up exams is warranted.   Original Report Authenticated By: Leanna Battles, M.D.     Medications: Scheduled Meds: . acetaminophen  500 mg Oral BID  . atorvastatin  10 mg Oral q1800  . calcium-vitamin D  1 tablet Oral BID WC  . ferrous sulfate  325 mg Oral Q breakfast  . heparin  5,000 Units Subcutaneous Q8H  . hydrALAZINE  12.5 mg Oral BID  . lactose free nutrition  0.5 Container Oral BID BM  . levothyroxine  25 mcg Oral Q breakfast  . memantine  10 mg Oral BID  . multivitamin with minerals  1 tablet Oral Daily  . polyethylene glycol  17 g Oral Daily  . Rotigotine  1 patch Transdermal Daily  . sodium chloride  3 mL Intravenous Q12H  . vitamin B-12  1,000 mcg Oral Daily      LOS: 1 day   RAI,RIPUDEEP M.D. [facility] 01/28/2013, 2:06 PM Pager: [phone]  If 7PM-7AM, please contact night-coverage [email or web address] Password [email or web address]

## 2013-01-28 NOTE — Progress Notes (Signed)
OT Cancellation Note  Patient Details Name: Ricky [name] MRN: [id number] DOB: [birth year]   Cancelled Treatment:    Reason Eval/Treat Not Completed: Other (comment) Pt total care. OT signing off. Please reorder if needed. [facility], OTR/L  [phone] 01/28/2013  WARD,HILLARY 01/28/2013, 3:33 PM

## 2013-01-28 NOTE — Evaluation (Signed)
Clinical/Bedside Swallow Evaluation Patient Details  Name: Ricky [name] MRN: [id number] Date of Birth: 03/28/1917  Today's Date: 01/28/2013 Time: 1207-1230 SLP Time Calculation (min): 23 min  Past Medical History:  Past Medical History  Diagnosis Date  . IBS (irritable bowel syndrome)   . Diverticulosis   . Cataract   . OAB (overactive bladder)   . Macular degeneration   . Alzheimer disease   . Hypertension   . Arthritis   . Osteoporosis   . Parkinsonian syndrome   . Syncope, vasovagal   . Neuromuscular disorder     parkisonian syndrome  . 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 Unspecified transient cerebral ischemia   . Unspecified urinary incontinence   . Senile dementia, uncomplicated   . Unspecified constipation   . Hypothyroidism   . Anemia, unspecified   . Depression   . Alzheimer's disease   . Gout, unspecified   . Thoracic or lumbosacral neuritis or radiculitis, unspecified    Past Surgical History:  Past Surgical History  Procedure Laterality Date  . Esophagogastroduodenoscopy    . Eye surgery  2006    LEFT CATARACT   HPI:  79 yr old admitted with progressive weakness, FTT, UTI.  History of progressive supranuclear palsy.  CXR 3/13 no acute abnormalities.  Had MBS 06/20/12 with Dys 3, thin and moderate esophageal dysphagia with prominent CP bar resulting in backflow into pyriform sinuses.   Assessment / Plan / Recommendation Clinical Impression  Pt. exhibited moderate oral dysphagia with decreased prep, manipulation, mastication and transit.  Pharyngeal phase characterized by audible swallow indicative of discoordination and/or structural impairment, and suspected delayed swallow initiation.  Delayed throat clearing and wet vocal quality indicative of post swallow residuals likely from UES backflow as observed during MBS in 8/13.  Recommend diet texture downgrade to Dys 1 and continue thin liquids.  ST will continue to follow  1-2 more times for pt./family education.       Aspiration Risk  Severe    Diet Recommendation Dysphagia 1 (Puree);Thin liquid   Liquid Administration via: Cup;Straw Medication Administration: Crushed with puree Supervision: Patient able to self feed;Full supervision/cueing for compensatory strategies Compensations: Slow rate;Small sips/bites Postural Changes and/or Swallow Maneuvers: Seated upright 90 degrees;Upright 30-60 min after meal    Other  Recommendations Oral Care Recommendations: Oral care BID   Follow Up Recommendations  None    Frequency and Duration min 2x/week  2 weeks   Pertinent Vitals/Pain none    SLP Swallow Goals Patient will utilize recommended strategies during swallow to increase swallowing safety with: Maximal cueing   Swallow Study Prior Functional Status  Lives With: Son Available Help at Discharge: Family       Oral/Motor/Sensory Function Overall Oral Motor/Sensory Function: Impaired at baseline (gross weakness w/ supranuclear palsy)   Ice Chips Ice chips: Not tested   Thin Liquid Thin Liquid: Impaired Presentation: Cup;Straw Pharyngeal  Phase Impairments: Suspected delayed Swallow;Wet Vocal Quality;Throat Clearing - Delayed (audible swallow)    Nectar Thick Nectar Thick Liquid: Not tested   Honey Thick Honey Thick Liquid: Not tested   Puree Puree: Impaired Presentation: Spoon Oral Phase Impairments: Impaired anterior to posterior transit Oral Phase Functional Implications: Prolonged oral transit Pharyngeal Phase Impairments: Multiple swallows;Suspected delayed Swallow   Solid       Solid: Impaired Oral Phase Impairments: Reduced lingual movement/coordination Oral Phase Functional Implications:  (prolonged transit)       Royce Macadamia M.Ed [employer] [phone]  01/28/2013

## 2013-01-29 DIAGNOSIS — R5381 Other malaise: Secondary | ICD-10-CM

## 2013-01-29 DIAGNOSIS — I10 Essential (primary) hypertension: Secondary | ICD-10-CM

## 2013-01-29 DIAGNOSIS — N400 Enlarged prostate without lower urinary tract symptoms: Secondary | ICD-10-CM

## 2013-01-29 DIAGNOSIS — K3184 Gastroparesis: Secondary | ICD-10-CM

## 2013-01-29 MED ORDER — ROTIGOTINE 3 MG/24HR TD PT24
3.0000 mg | MEDICATED_PATCH | TRANSDERMAL | Status: DC
  Administered 2013-01-30: 3 mg via TRANSDERMAL
  Filled 2013-01-29: qty 1

## 2013-01-29 MED ORDER — HYDRALAZINE HCL 25 MG PO TABS
25.0000 mg | ORAL_TABLET | Freq: Two times a day (BID) | ORAL | Status: DC
  Administered 2013-01-29 – 2013-02-01 (×6): 25 mg via ORAL
  Filled 2013-01-29 (×8): qty 1

## 2013-01-29 NOTE — Clinical Social Work Psychosocial (Signed)
     Clinical Social Work Department BRIEF PSYCHOSOCIAL ASSESSMENT 01/29/2013  Patient:  Ricky [name], Ricky [name]     Account Number:  [id number]     Admit date:  01/27/2013  Clinical Social Worker:  Hulan Fray  Date/Time:  01/29/2013 03:58 PM  Referred by:  Physician  Date Referred:  01/29/2013 Referred for  SNF Placement   Other Referral:   Interview type:  Family Other interview type:   son- Ricky [name] [phone])    PSYCHOSOCIAL DATA Living Status:  FAMILY Admitted from facility:   Level of care:   Primary support name:  Ricky [name] Primary support relationship to patient:  CHILD, ADULT Degree of support available:   supportive    CURRENT CONCERNS Current Concerns  Post-Acute Placement   Other Concerns:    SOCIAL WORK ASSESSMENT / PLAN Clincial Social Worker received referral for SNF placement at discharge for patient. CSW went by patient's room, but no family present. CSW called patient's son, Ricky [name] to discuss consult for snf placement.    Per son, he prefers not have patient placed in nursing home due to previous "bad experiences." Son reported that the patient had been to several nursing homes in the past and tends to "get worse." Son reported that he is there with patient all day and all night and had previous home health companies that he has worked with in the past of [place] [employer] and [employer]. Son expressed that he would use [facility] for PT for patient and might need equipment for patient to continue his therapies. Son reported that sister will contact CSW as well.    CSW will follow as needed.   Assessment/plan status:  Other - See comment Other assessment/ plan:   Son prefers to have patient return home with him for continued care.   Information/referral to community resources:   None requested.    PATIENTS/FAMILYS RESPONSE TO PLAN OF CARE: Son reported that he prefers to have patient discharge home when stable. Son reported not having a  good experience with nursing homes when patient was placed previously.

## 2013-01-29 NOTE — Progress Notes (Addendum)
Patient ID: Ricky [name]  male  ZHY:[id number]    DOB: [birth year]    DOA: 01/27/2013  PCP: Bufford Spikes, DO  Assessment/Plan: Principal Problem:   UTI (urinary tract infection) - Urine culture showed multiple bacterial morphotypes, will resend UA and culture, continue IV Rocephin  Active Problems:   FTT (failure to thrive) in adult - Progressive weakness over the months, dehydration, UTI, dementia, progressive neurological disease, PT evaluation done, recommending nursing home placement as patient is total assist     Progressive Neurological disease: as #2, PT/OT evaluation was done and recommended nursing home placement    Dysphagia - Tolerating dysphagia 1 diet    Dementia continue Namenda  Hypothyroidism: Continue Synthroid   DVT Prophylaxis:  Code Status:DO NOT RESUSCITATE status   Disposition:Will likely need nursing home placement    Subjective: No pain, awaiting assistance with breakfast at the time of my encounter   Objective: Weight change:   Intake/Output Summary (Last 24 hours) at 01/29/13 1310 Last data filed at 01/29/13 0533  Gross per 24 hour  Intake    711 ml  Output   1650 ml  Net   -939 ml   Blood pressure 163/84, pulse 77, temperature 97.9 F (36.6 C), temperature source Oral, resp. rate 18, height 6\' 1"  (1.854 m), SpO2 96.00%.  Physical Exam: General: Alert and awake, NAD CVS: S1-S2 clear Chest: CTAB Abdomen: soft nontender, nondistended, normal bowel sounds Extremities: Contractures in the upper extremities, no cyanosis/clubbing or edema noted bilaterally   Lab Results: Basic Metabolic Panel:  Recent Labs Lab 01/27/13 1055 01/27/13 1542 01/28/13 0520  NA 139  --  139  K 4.1  --  3.9  CL 101  --  104  CO2 26  --  28  GLUCOSE 99  --  97  BUN 12  --  11  CREATININE 0.92 0.85 0.85  CALCIUM 9.7  --  8.8  MG  --  2.0  --    Liver Function Tests:  Recent Labs Lab 01/27/13 1055 01/28/13 0520  AST 19 15  ALT 15 11  ALKPHOS  93 76  BILITOT 0.5 0.5  PROT 8.6* 7.0  ALBUMIN 3.6 2.9*   No results found for this basename: LIPASE, AMYLASE,  in the last 168 hours No results found for this basename: AMMONIA,  in the last 168 hours CBC:  Recent Labs Lab 01/27/13 1055 01/27/13 1542 01/28/13 0520  WBC 8.2 7.4 6.0  NEUTROABS 5.8  --   --   HGB 13.1 12.3* 11.1*  HCT 40.1 38.1* 34.8*  MCV 87.6 87.2 86.8  PLT 221 213 190   Cardiac Enzymes: No results found for this basename: CKTOTAL, CKMB, CKMBINDEX, TROPONINI,  in the last 168 hours BNP: No components found with this basename: POCBNP,  CBG: No results found for this basename: GLUCAP,  in the last 168 hours   Micro Results: Recent Results (from the past 240 hour(s))  CULTURE, BLOOD (ROUTINE X 2)     Status: None   Collection Time    01/27/13 11:10 AM      Result Value Range Status   Specimen Description BLOOD HAND LEFT   Final   Special Requests BOTTLES DRAWN AEROBIC ONLY 5CC   Final   Culture  Setup Time 01/27/2013 17:18   Final   Culture     Final   Value:        BLOOD CULTURE RECEIVED NO GROWTH TO DATE CULTURE WILL BE HELD FOR 5 DAYS BEFORE  ISSUING A FINAL NEGATIVE REPORT   Report Status PENDING   Incomplete  CULTURE, BLOOD (ROUTINE X 2)     Status: None   Collection Time    01/27/13 11:20 AM      Result Value Range Status   Specimen Description BLOOD HAND RIGHT   Final   Special Requests BOTTLES DRAWN AEROBIC ONLY 5CC   Final   Culture  Setup Time 01/27/2013 17:18   Final   Culture     Final   Value:        BLOOD CULTURE RECEIVED NO GROWTH TO DATE CULTURE WILL BE HELD FOR 5 DAYS BEFORE ISSUING A FINAL NEGATIVE REPORT   Report Status PENDING   Incomplete  URINE CULTURE     Status: None   Collection Time    01/27/13 12:00 PM      Result Value Range Status   Specimen Description URINE, CATHETERIZED   Final   Special Requests NONE   Final   Culture  Setup Time 01/27/2013 12:54   Final   Colony Count >=100,000 COLONIES/ML   Final   Culture      Final   Value: Multiple bacterial morphotypes present, none predominant. Suggest appropriate recollection if clinically indicated.   Report Status 01/28/2013 FINAL   Final  MRSA PCR SCREENING     Status: None   Collection Time    01/28/13  2:17 PM      Result Value Range Status   MRSA by PCR NEGATIVE  NEGATIVE Final   Comment:            The GeneXpert MRSA Assay (FDA     approved for NASAL specimens     only), is one component of a     comprehensive MRSA colonization     surveillance program. It is not     intended to diagnose MRSA     infection nor to guide or     monitor treatment for     MRSA infections.    Studies/Results: Dg Chest 1 View  01/27/2013  *RADIOLOGY REPORT*  Clinical Data: Altered mental status.  CHEST - 1 VIEW  Comparison: 09/07/2012.  Findings: Trachea is midline.  Heart is enlarged, stable.  Thoracic aorta is calcified.  Mild chronic changes are seen in the lungs without air space consolidation or pleural fluid. Probable nodular summation shadow in the right suprahilar region.  IMPRESSION:  1.  No acute findings. 2.  Probable nodular summation shadow in the right suprahilar region.  Continued attention on follow-up exams is warranted.   Original Report Authenticated By: Leanna Battles, M.D.     Medications: Scheduled Meds: . acetaminophen  500 mg Oral BID  . atorvastatin  10 mg Oral q1800  . calcium-vitamin D  1 tablet Oral BID WC  . cefTRIAXone (ROCEPHIN)  IV  1 g Intravenous Q24H  . ferrous sulfate  325 mg Oral Q breakfast  . heparin  5,000 Units Subcutaneous Q8H  . hydrALAZINE  12.5 mg Oral BID  . lactose free nutrition  0.5 Container Oral BID BM  . levothyroxine  25 mcg Oral Q breakfast  . memantine  10 mg Oral BID  . multivitamin with minerals  1 tablet Oral Daily  . polyethylene glycol  17 g Oral Daily  . [START ON 01/30/2013] Rotigotine  3 mg Transdermal Q24H  . sodium chloride  3 mL Intravenous Q12H  . vitamin B-12  1,000 mcg Oral Daily       LOS: 2 days  Kionte Baumgardner M.D. [facility] 01/29/2013, 1:10 PM Pager: [phone]  If 7PM-7AM, please contact night-coverage [email or web address] Password [email or web address]

## 2013-01-30 DIAGNOSIS — I6350 Cerebral infarction due to unspecified occlusion or stenosis of unspecified cerebral artery: Secondary | ICD-10-CM

## 2013-01-30 LAB — URINALYSIS, ROUTINE W REFLEX MICROSCOPIC
Nitrite: POSITIVE — AB
Specific Gravity, Urine: 1.02 (ref 1.005–1.030)
Urobilinogen, UA: 0.2 mg/dL (ref 0.0–1.0)

## 2013-01-30 MED ORDER — ROTIGOTINE 2 MG/24HR TD PT24
MEDICATED_PATCH | Freq: Every day | TRANSDERMAL | Status: DC
  Administered 2013-01-30 – 2013-02-02 (×3): via TRANSDERMAL
  Filled 2013-01-30 (×3): qty 1

## 2013-01-30 MED ORDER — BACITRACIN ZINC 500 UNIT/GM EX OINT
TOPICAL_OINTMENT | Freq: Two times a day (BID) | CUTANEOUS | Status: DC
  Administered 2013-01-30 – 2013-01-31 (×4): via TOPICAL
  Administered 2013-02-01: 1 via TOPICAL
  Administered 2013-02-01 – 2013-02-03 (×4): via TOPICAL
  Filled 2013-01-30: qty 15

## 2013-01-30 NOTE — Progress Notes (Signed)
Patient ID: Ricky [name]  male  ZOX:[id number]    DOB: 07/27/1917    DOA: 01/27/2013  PCP: Bufford Spikes, DO  Assessment/Plan: Principal Problem:   UTI (urinary tract infection) - Urine culture showed multiple bacterial morphotypes, pending repeat urine culture, continue IV Rocephin  Active Problems:   FTT (failure to thrive) in adult - Progressive weakness over the months, dehydration, UTI, dementia, progressive neurological disease, PT evaluation done, recommending nursing home placement as patient is total assist     Progressive Neurological disease: as #2, PT/OT evaluation was done and recommended nursing home placement, although per SW, patient's son declined NH placement    Dysphagia - Tolerating dysphagia 1 diet    Dementia continue Namenda  Hypothyroidism: Continue Synthroid   DVT Prophylaxis:  Code Status:DO NOT RESUSCITATE status   Disposition: hopefully 24-48hrs, pending urine culture results    Subjective: Denies any specific complaints.    Objective: Weight change:   Intake/Output Summary (Last 24 hours) at 01/30/13 1056 Last data filed at 01/30/13 0256  Gross per 24 hour  Intake  872.5 ml  Output    675 ml  Net  197.5 ml   Blood pressure 159/79, pulse 66, temperature 99.3 F (37.4 C), temperature source Oral, resp. rate 18, height 6\' 1"  (1.854 m), SpO2 96.00%.  Physical Exam: General: Alert and awake, NAD CVS: S1-S2 clear Chest: CTAB Abdomen: soft NT, ND, NBS Extremities: Contractures in the upper extremities  no cyanosis/clubbing or edema noted bilaterally   Lab Results: Basic Metabolic Panel:  Recent Labs Lab 01/27/13 1055 01/27/13 1542 01/28/13 0520  NA 139  --  139  K 4.1  --  3.9  CL 101  --  104  CO2 26  --  28  GLUCOSE 99  --  97  BUN 12  --  11  CREATININE 0.92 0.85 0.85  CALCIUM 9.7  --  8.8  MG  --  2.0  --    Liver Function Tests:  Recent Labs Lab 01/27/13 1055 01/28/13 0520  AST 19 15  ALT 15 11  ALKPHOS 93  76  BILITOT 0.5 0.5  PROT 8.6* 7.0  ALBUMIN 3.6 2.9*   No results found for this basename: LIPASE, AMYLASE,  in the last 168 hours No results found for this basename: AMMONIA,  in the last 168 hours CBC:  Recent Labs Lab 01/27/13 1055 01/27/13 1542 01/28/13 0520  WBC 8.2 7.4 6.0  NEUTROABS 5.8  --   --   HGB 13.1 12.3* 11.1*  HCT 40.1 38.1* 34.8*  MCV 87.6 87.2 86.8  PLT 221 213 190   Cardiac Enzymes: No results found for this basename: CKTOTAL, CKMB, CKMBINDEX, TROPONINI,  in the last 168 hours BNP: No components found with this basename: POCBNP,  CBG: No results found for this basename: GLUCAP,  in the last 168 hours   Micro Results: Recent Results (from the past 240 hour(s))  CULTURE, BLOOD (ROUTINE X 2)     Status: None   Collection Time    01/27/13 11:10 AM      Result Value Range Status   Specimen Description BLOOD HAND LEFT   Final   Special Requests BOTTLES DRAWN AEROBIC ONLY 5CC   Final   Culture  Setup Time 01/27/2013 17:18   Final   Culture     Final   Value:        BLOOD CULTURE RECEIVED NO GROWTH TO DATE CULTURE WILL BE HELD FOR 5 DAYS BEFORE ISSUING A  FINAL NEGATIVE REPORT   Report Status PENDING   Incomplete  CULTURE, BLOOD (ROUTINE X 2)     Status: None   Collection Time    01/27/13 11:20 AM      Result Value Range Status   Specimen Description BLOOD HAND RIGHT   Final   Special Requests BOTTLES DRAWN AEROBIC ONLY 5CC   Final   Culture  Setup Time 01/27/2013 17:18   Final   Culture     Final   Value:        BLOOD CULTURE RECEIVED NO GROWTH TO DATE CULTURE WILL BE HELD FOR 5 DAYS BEFORE ISSUING A FINAL NEGATIVE REPORT   Report Status PENDING   Incomplete  URINE CULTURE     Status: None   Collection Time    01/27/13 12:00 PM      Result Value Range Status   Specimen Description URINE, CATHETERIZED   Final   Special Requests NONE   Final   Culture  Setup Time 01/27/2013 12:54   Final   Colony Count >=100,000 COLONIES/ML   Final   Culture      Final   Value: Multiple bacterial morphotypes present, none predominant. Suggest appropriate recollection if clinically indicated.   Report Status 01/28/2013 FINAL   Final  MRSA PCR SCREENING     Status: None   Collection Time    01/28/13  2:17 PM      Result Value Range Status   MRSA by PCR NEGATIVE  NEGATIVE Final   Comment:            The GeneXpert MRSA Assay (FDA     approved for NASAL specimens     only), is one component of a     comprehensive MRSA colonization     surveillance program. It is not     intended to diagnose MRSA     infection nor to guide or     monitor treatment for     MRSA infections.    Studies/Results: Dg Chest 1 View  01/27/2013  *RADIOLOGY REPORT*  Clinical Data: Altered mental status.  CHEST - 1 VIEW  Comparison: 09/07/2012.  Findings: Trachea is midline.  Heart is enlarged, stable.  Thoracic aorta is calcified.  Mild chronic changes are seen in the lungs without air space consolidation or pleural fluid. Probable nodular summation shadow in the right suprahilar region.  IMPRESSION:  1.  No acute findings. 2.  Probable nodular summation shadow in the right suprahilar region.  Continued attention on follow-up exams is warranted.   Original Report Authenticated By: Leanna Battles, M.D.     Medications: Scheduled Meds: . acetaminophen  500 mg Oral BID  . atorvastatin  10 mg Oral q1800  . calcium-vitamin D  1 tablet Oral BID WC  . cefTRIAXone (ROCEPHIN)  IV  1 g Intravenous Q24H  . ferrous sulfate  325 mg Oral Q breakfast  . heparin  5,000 Units Subcutaneous Q8H  . hydrALAZINE  25 mg Oral BID  . lactose free nutrition  0.5 Container Oral BID BM  . levothyroxine  25 mcg Oral Q breakfast  . memantine  10 mg Oral BID  . multivitamin with minerals  1 tablet Oral Daily  . polyethylene glycol  17 g Oral Daily  . Rotigotine 3 mg, rotigotine (NEUPRO) 2 MG/24HR   Transdermal QHS  . sodium chloride  3 mL Intravenous Q12H  . vitamin B-12  1,000 mcg Oral Daily       LOS: 3 days  RAI,RIPUDEEP M.D. [facility] 01/30/2013, 10:56 AM Pager: [phone]  If 7PM-7AM, please contact night-coverage [email or web address] Password [email or web address]

## 2013-01-31 ENCOUNTER — Telehealth: Payer: Self-pay | Admitting: Internal Medicine

## 2013-01-31 DIAGNOSIS — G20 Parkinson's disease: Secondary | ICD-10-CM

## 2013-01-31 NOTE — Telephone Encounter (Signed)
Faxed medical records to [facility] @ [phone]

## 2013-01-31 NOTE — Progress Notes (Signed)
Patient ID: Ricky [name]  male  OZH:[id number]    DOB: 1917/01/15    DOA: 01/27/2013  PCP: Bufford Spikes, DO  Assessment/Plan: Principal Problem:   UTI (urinary tract infection) - Repeat urine culture showing more than 100,000 colonies of gram-negative rods, continue IV Rocephin - Will adjust antibiotics according to the sensitivities - follows Dr Brunilda Payor (urology)   Active Problems:   FTT (failure to thrive) in adult - Progressive weakness over the months, dehydration, UTI, dementia, progressive neurological disease, PT evaluation done, recommending NHP as patient is total assist     Progressive Neurological disease: as #2, PT/OT eval rec nursing home placement, although per SW, patient's son/daughter declined NH placement    Dysphagia - Tolerating dysphagia 1 diet    Dementia continue Namenda  Hypothyroidism: Continue Synthroid  DVT Prophylaxis:  Code Status:DO NOT RESUSCITATE status   Disposition: pending urine culture & sensitivity, DC hopefully tomorrow. Discussed in detail with patient's daughter, [name] [phone]), requested for power lift chair, home PT upon DC tomorrow.  Will update case mangement.    Subjective: Denies any specific complaints.    Objective: Weight change:   Intake/Output Summary (Last 24 hours) at 01/31/13 1506 Last data filed at 01/31/13 0900  Gross per 24 hour  Intake   1496 ml  Output   1775 ml  Net   -279 ml   Blood pressure 141/68, pulse 75, temperature 98.4 F (36.9 C), temperature source Oral, resp. rate 18, height 6\' 1"  (1.854 m), SpO2 97.00%.  Physical Exam: General: Alert, NAD CVS: S1-S2 clear Chest: CTAB Abdomen: soft NT, ND, NBS Extremities: Contractures in the upper extremities    Lab Results: Basic Metabolic Panel:  Recent Labs Lab 01/27/13 1055 01/27/13 1542 01/28/13 0520  NA 139  --  139  K 4.1  --  3.9  CL 101  --  104  CO2 26  --  28  GLUCOSE 99  --  97  BUN 12  --  11  CREATININE 0.92 0.85 0.85   CALCIUM 9.7  --  8.8  MG  --  2.0  --    Liver Function Tests:  Recent Labs Lab 01/27/13 1055 01/28/13 0520  AST 19 15  ALT 15 11  ALKPHOS 93 76  BILITOT 0.5 0.5  PROT 8.6* 7.0  ALBUMIN 3.6 2.9*   No results found for this basename: LIPASE, AMYLASE,  in the last 168 hours No results found for this basename: AMMONIA,  in the last 168 hours CBC:  Recent Labs Lab 01/27/13 1055 01/27/13 1542 01/28/13 0520  WBC 8.2 7.4 6.0  NEUTROABS 5.8  --   --   HGB 13.1 12.3* 11.1*  HCT 40.1 38.1* 34.8*  MCV 87.6 87.2 86.8  PLT 221 213 190     Micro Results: Recent Results (from the past 240 hour(s))  CULTURE, BLOOD (ROUTINE X 2)     Status: None   Collection Time    01/27/13 11:10 AM      Result Value Range Status   Specimen Description BLOOD HAND LEFT   Final   Special Requests BOTTLES DRAWN AEROBIC ONLY 5CC   Final   Culture  Setup Time 01/27/2013 17:18   Final   Culture     Final   Value:        BLOOD CULTURE RECEIVED NO GROWTH TO DATE CULTURE WILL BE HELD FOR 5 DAYS BEFORE ISSUING A FINAL NEGATIVE REPORT   Report Status PENDING   Incomplete  CULTURE, BLOOD (  ROUTINE X 2)     Status: None   Collection Time    01/27/13 11:20 AM      Result Value Range Status   Specimen Description BLOOD HAND RIGHT   Final   Special Requests BOTTLES DRAWN AEROBIC ONLY 5CC   Final   Culture  Setup Time 01/27/2013 17:18   Final   Culture     Final   Value:        BLOOD CULTURE RECEIVED NO GROWTH TO DATE CULTURE WILL BE HELD FOR 5 DAYS BEFORE ISSUING A FINAL NEGATIVE REPORT   Report Status PENDING   Incomplete  URINE CULTURE     Status: None   Collection Time    01/27/13 12:00 PM      Result Value Range Status   Specimen Description URINE, CATHETERIZED   Final   Special Requests NONE   Final   Culture  Setup Time 01/27/2013 12:54   Final   Colony Count >=100,000 COLONIES/ML   Final   Culture     Final   Value: Multiple bacterial morphotypes present, none predominant. Suggest appropriate  recollection if clinically indicated.   Report Status 01/28/2013 FINAL   Final  MRSA PCR SCREENING     Status: None   Collection Time    01/28/13  2:17 PM      Result Value Range Status   MRSA by PCR NEGATIVE  NEGATIVE Final   Comment:            The GeneXpert MRSA Assay (FDA     approved for NASAL specimens     only), is one component of a     comprehensive MRSA colonization     surveillance program. It is not     intended to diagnose MRSA     infection nor to guide or     monitor treatment for     MRSA infections.  URINE CULTURE     Status: None   Collection Time    01/30/13  2:57 AM      Result Value Range Status   Specimen Description URINE, RANDOM   Final   Special Requests NONE   Final   Culture  Setup Time 01/30/2013 14:57   Final   Colony Count >=100,000 COLONIES/ML   Final   Culture GRAM NEGATIVE RODS   Final   Report Status PENDING   Incomplete    Studies/Results: Dg Chest 1 View  01/27/2013  *RADIOLOGY REPORT*  Clinical Data: Altered mental status.  CHEST - 1 VIEW  Comparison: 09/07/2012.  Findings: Trachea is midline.  Heart is enlarged, stable.  Thoracic aorta is calcified.  Mild chronic changes are seen in the lungs without air space consolidation or pleural fluid. Probable nodular summation shadow in the right suprahilar region.  IMPRESSION:  1.  No acute findings. 2.  Probable nodular summation shadow in the right suprahilar region.  Continued attention on follow-up exams is warranted.   Original Report Authenticated By: Leanna Battles, M.D.     Medications: Scheduled Meds: . acetaminophen  500 mg Oral BID  . atorvastatin  10 mg Oral q1800  . bacitracin   Topical BID  . calcium-vitamin D  1 tablet Oral BID WC  . cefTRIAXone (ROCEPHIN)  IV  1 g Intravenous Q24H  . ferrous sulfate  325 mg Oral Q breakfast  . heparin  5,000 Units Subcutaneous Q8H  . hydrALAZINE  25 mg Oral BID  . lactose free nutrition  0.5 Container Oral BID BM  .  levothyroxine  25 mcg Oral  Q breakfast  . memantine  10 mg Oral BID  . multivitamin with minerals  1 tablet Oral Daily  . polyethylene glycol  17 g Oral Daily  . Rotigotine 3 mg, rotigotine (NEUPRO) 2 MG/24HR   Transdermal QHS  . sodium chloride  3 mL Intravenous Q12H  . vitamin B-12  1,000 mcg Oral Daily      LOS: 4 days   Suheyla Mortellaro M.D. [facility] 01/31/2013, 3:06 PM Pager: [phone]  If 7PM-7AM, please contact night-coverage [email or web address] Password [email or web address]

## 2013-01-31 NOTE — Progress Notes (Signed)
Speech Language Pathology Dysphagia Treatment Patient Details Name: Ricky [name] MRN: [id number] DOB: 18-Jun-1917 Today's Date: 01/31/2013 Time: 7829-5621 SLP Time Calculation (min): 8 min  Assessment / Plan / Recommendation Clinical Impression  Pt's swallow function appears to be at baseline with chronic deficits in esophageal function and impaired oral manipulation.  Pt appears to be tolerating current diet with assist for feeding; no overt s/s of aspiration, but given advanced age and comorbidities potential for aspiration is a reality.  Recommend continuing basic precautions, particularly keeping HOB elevated during PO intake and 45 min after given hx of esophageal dismotility. No further SLP f/u is warranted - will sign off.     Diet Recommendation  Continue with Current Diet: Dysphagia 1 (puree);Thin liquid    SLP Plan All goals met   Pertinent Vitals/Pain no pain  Swallowing Goals  SLP Swallowing Goals Patient will utilize recommended strategies during swallow to increase swallowing safety with: Minimal assistance  General Temperature Spikes Noted: No Respiratory Status: Room air Behavior/Cognition: Alert;Cooperative;Requires cueing;Decreased sustained attention Oral Cavity - Dentition: Adequate natural dentition Patient Positioning: Upright in bed  Oral Cavity - Oral Hygiene     Dysphagia Treatment Treatment focused on: Skilled observation of diet tolerance Treatment Methods/Modalities: Skilled observation Patient observed directly with PO's: Yes Type of PO's observed: Thin liquids Feeding: Needs assist Liquids provided via: Cup Pharyngeal Phase Signs & Symptoms: Audible swallow Type of cueing: Verbal Amount of cueing: Minimal   Ricky [name] L. Ricky [name], [place] CCC/SLP Pager [phone]      Ricky [name] 01/31/2013, 11:39 AM

## 2013-02-01 DIAGNOSIS — Z0289 Encounter for other administrative examinations: Secondary | ICD-10-CM

## 2013-02-01 MED ORDER — OXYCODONE HCL 5 MG PO TABS: 5.0000 mg | ORAL_TABLET | Freq: Three times a day (TID) | ORAL | Status: DC | PRN

## 2013-02-01 MED ORDER — CEPHALEXIN 500 MG PO CAPS: 500.0000 mg | ORAL_CAPSULE | Freq: Two times a day (BID) | ORAL | Status: DC

## 2013-02-01 MED ORDER — UNABLE TO FIND: Status: DC

## 2013-02-01 MED ORDER — HYDRALAZINE HCL 25 MG PO TABS: 25.0000 mg | ORAL_TABLET | Freq: Three times a day (TID) | ORAL | Status: DC

## 2013-02-01 MED ORDER — BACITRACIN ZINC 500 UNIT/GM EX OINT: TOPICAL_OINTMENT | Freq: Two times a day (BID) | CUTANEOUS | Status: DC

## 2013-02-01 MED ORDER — HYDRALAZINE HCL 25 MG PO TABS
25.0000 mg | ORAL_TABLET | Freq: Three times a day (TID) | ORAL | Status: DC
  Administered 2013-02-01 – 2013-02-03 (×6): 25 mg via ORAL
  Filled 2013-02-01 (×9): qty 1

## 2013-02-01 NOTE — Progress Notes (Signed)
Patient ID: Ricky [name]  male  ZOX:[id number]    DOB: May 13, 1917    DOA: 01/27/2013  PCP: Ricky Spikes, MD  Assessment/Plan: Principal Problem:   Gram negative UTI (urinary tract infection) - Repeat urine culture still showing more than 100,000 colonies of gram-negative rods, continue IV Rocephin. I called [facility] lab [phone]), the lab tech reported that they have to do 'resistance test" on the culture, and the final results will be back tomorrow.  - Will adjust antibiotics according to the sensitivities - follows Dr Ricky [name] (urology)   Active Problems:   FTT (failure to thrive) in adult - Progressive weakness over the months, dehydration, UTI, dementia, progressive neurological disease, PT evaluation done, recommending NHP as patient is total assist     Progressive Neurological disease: as #2, PT/OT eval rec nursing home placement, although per SW, patient's son/daughter declined NH placement    Dysphagia - Tolerating dysphagia 1 diet    Dementia continue Namenda  Hypothyroidism: Continue Synthroid  DVT Prophylaxis:  Code Status:DO NOT RESUSCITATE status   Disposition: pending urine culture & sensitivity, DC tomorrow.  Discussed in detail with patient's daughter, Ricky [name] [phone]).    Subjective: Denies any specific complaints.    Objective: Weight change:   Intake/Output Summary (Last 24 hours) at 02/01/13 1344 Last data filed at 02/01/13 1023  Gross per 24 hour  Intake    360 ml  Output   2051 ml  Net  -1691 ml   Blood pressure 154/78, pulse 85, temperature 99 F (37.2 C), temperature source Oral, resp. rate 18, height 6\' 1"  (1.854 m), SpO2 97.00%.  Physical Exam: General: Alert, NAD CVS: S1-S2 clear Chest: CTAB Abdomen: soft NT, ND, NBS Extremities: Contractures in the upper extremities GU: foley +   Lab Results: Basic Metabolic Panel:  Recent Labs Lab 01/27/13 1055 01/27/13 1542 01/28/13 0520  NA 139  --  139  K 4.1  --  3.9  CL 101  --   104  CO2 26  --  28  GLUCOSE 99  --  97  BUN 12  --  11  CREATININE 0.92 0.85 0.85  CALCIUM 9.7  --  8.8  MG  --  2.0  --    Liver Function Tests:  Recent Labs Lab 01/27/13 1055 01/28/13 0520  AST 19 15  ALT 15 11  ALKPHOS 93 76  BILITOT 0.5 0.5  PROT 8.6* 7.0  ALBUMIN 3.6 2.9*   No results found for this basename: LIPASE, AMYLASE,  in the last 168 hours No results found for this basename: AMMONIA,  in the last 168 hours CBC:  Recent Labs Lab 01/27/13 1055 01/27/13 1542 01/28/13 0520  WBC 8.2 7.4 6.0  NEUTROABS 5.8  --   --   HGB 13.1 12.3* 11.1*  HCT 40.1 38.1* 34.8*  MCV 87.6 87.2 86.8  PLT 221 213 190     Micro Results: Recent Results (from the past 240 hour(s))  CULTURE, BLOOD (ROUTINE X 2)     Status: None   Collection Time    01/27/13 11:10 AM      Result Value Range Status   Specimen Description BLOOD HAND LEFT   Final   Special Requests BOTTLES DRAWN AEROBIC ONLY 5CC   Final   Culture  Setup Time 01/27/2013 17:18   Final   Culture     Final   Value:        BLOOD CULTURE RECEIVED NO GROWTH TO DATE CULTURE WILL BE HELD FOR 5  DAYS BEFORE ISSUING A FINAL NEGATIVE REPORT   Report Status PENDING   Incomplete  CULTURE, BLOOD (ROUTINE X 2)     Status: None   Collection Time    01/27/13 11:20 AM      Result Value Range Status   Specimen Description BLOOD HAND RIGHT   Final   Special Requests BOTTLES DRAWN AEROBIC ONLY 5CC   Final   Culture  Setup Time 01/27/2013 17:18   Final   Culture     Final   Value:        BLOOD CULTURE RECEIVED NO GROWTH TO DATE CULTURE WILL BE HELD FOR 5 DAYS BEFORE ISSUING A FINAL NEGATIVE REPORT   Report Status PENDING   Incomplete  URINE CULTURE     Status: None   Collection Time    01/27/13 12:00 PM      Result Value Range Status   Specimen Description URINE, CATHETERIZED   Final   Special Requests NONE   Final   Culture  Setup Time 01/27/2013 12:54   Final   Colony Count >=100,000 COLONIES/ML   Final   Culture     Final    Value: Multiple bacterial morphotypes present, none predominant. Suggest appropriate recollection if clinically indicated.   Report Status 01/28/2013 FINAL   Final  MRSA PCR SCREENING     Status: None   Collection Time    01/28/13  2:17 PM      Result Value Range Status   MRSA by PCR NEGATIVE  NEGATIVE Final   Comment:            The GeneXpert MRSA Assay (FDA     approved for NASAL specimens     only), is one component of a     comprehensive MRSA colonization     surveillance program. It is not     intended to diagnose MRSA     infection nor to guide or     monitor treatment for     MRSA infections.  URINE CULTURE     Status: None   Collection Time    01/30/13  2:57 AM      Result Value Range Status   Specimen Description URINE, RANDOM   Final   Special Requests NONE   Final   Culture  Setup Time 01/30/2013 14:57   Final   Colony Count >=100,000 COLONIES/ML   Final   Culture GRAM NEGATIVE RODS   Final   Report Status PENDING   Incomplete    Studies/Results: Dg Chest 1 View  01/27/2013  *RADIOLOGY REPORT*  Clinical Data: Altered mental status.  CHEST - 1 VIEW  Comparison: 09/07/2012.  Findings: Trachea is midline.  Heart is enlarged, stable.  Thoracic aorta is calcified.  Mild chronic changes are seen in the lungs without air space consolidation or pleural fluid. Probable nodular summation shadow in the right suprahilar region.  IMPRESSION:  1.  No acute findings. 2.  Probable nodular summation shadow in the right suprahilar region.  Continued attention on follow-up exams is warranted.   Original Report Authenticated By: Leanna Battles, M.D.     Medications: Scheduled Meds: . acetaminophen  500 mg Oral BID  . atorvastatin  10 mg Oral q1800  . bacitracin   Topical BID  . calcium-vitamin D  1 tablet Oral BID WC  . cefTRIAXone (ROCEPHIN)  IV  1 g Intravenous Q24H  . ferrous sulfate  325 mg Oral Q breakfast  . heparin  5,000 Units Subcutaneous Q8H  .  hydrALAZINE  25 mg Oral  Q8H  . lactose free nutrition  0.5 Container Oral BID BM  . levothyroxine  25 mcg Oral Q breakfast  . memantine  10 mg Oral BID  . multivitamin with minerals  1 tablet Oral Daily  . polyethylene glycol  17 g Oral Daily  . Rotigotine 3 mg, rotigotine (NEUPRO) 2 MG/24HR   Transdermal QHS  . sodium chloride  3 mL Intravenous Q12H  . vitamin B-12  1,000 mcg Oral Daily      LOS: 5 days   Agustine Rossitto M.D. [facility] 02/01/2013, 1:44 PM Pager: [phone]  If 7PM-7AM, please contact night-coverage [email or web address] Password [email or web address]

## 2013-02-01 NOTE — Care Management Note (Signed)
  Page 2 of 2   02/01/2013     2:29:51 PM   CARE MANAGEMENT NOTE 02/01/2013  Patient:  Ricky [name], Ricky [name]   Account Number:  [id number]  Date Initiated:  01/31/2013  Documentation initiated by:  Ronny Flurry  Subjective/Objective Assessment:   Urine culture showed multiple bacterial morphotypes, pending repeat urine culture, continue IV Rocephin     Action/Plan:   Anticipated DC Date:  02/01/2013   Anticipated DC Plan:  HOME W HOME HEALTH SERVICES         Choice offered to / List presented to:  C-4 Adult Children   DME arranged  HOSPITAL BED      DME agency  [facility]     HH arranged  HH-2 PT      [facility] agency  [facility]   Status of service:  In process, will continue to follow Medicare Important Message given?   (If response is "NO", the following Medicare IM given date fields will be blank) Date Medicare IM given:   Date Additional Medicare IM given:    Discharge Disposition:  HOME W HOME HEALTH SERVICES  Per UR Regulation:  Reviewed for med. necessity/level of care/duration of stay  If discussed at Long Length of Stay Meetings, dates discussed:    Comments:    02-01-13 Shanda Bumps from [facility] called , reported Ricky [name] did not want the hospital bed which they had . Patient had tried it in past and bed too short . Shanda Bumps stated that patient had a [employer] plan that he could get DME from other agencies . [facility] " willing to look into insurance " however they have the same hospital bed as [facility] . Explained to Ricky [name] and Ricky [name] , both agreed they did not want hospital bed at this time .  Ronny Flurry RN BSN [phone]   02-01-13 Spoke with daughter  Ricky [name]  [phone] ) regarding home health . Patient lives with son Ricky [name] [phone]) , both take care of Ricky [name].  Patient's address confirmed . Patient has had [place] in the past and Ricky [name] would like to continue with [place] ( referral made). Ricky [name] also Contractor for home use. Explained MD  will write prescription for same and she can go to a medical supply store and choose the chair they would like .  Ricky [name] also wanting hospital bed. Due to insurance patient will have to go through [facility] for hospital bed.  Sharyn Creamer with order and faxed all needed information . Christoper Allegra will be in contact with patient's family for delivery .  [facility] fax [phone] , phone [phone] .  Ronny Flurry RN BSN [phone]

## 2013-02-02 ENCOUNTER — Inpatient Hospital Stay (HOSPITAL_COMMUNITY): Payer: Medicare HMO

## 2013-02-02 ENCOUNTER — Encounter (HOSPITAL_COMMUNITY): Payer: Self-pay | Admitting: Radiology

## 2013-02-02 LAB — CULTURE, BLOOD (ROUTINE X 2): Culture: NO GROWTH

## 2013-02-02 MED ORDER — ASPIRIN EC 325 MG PO TBEC
325.0000 mg | DELAYED_RELEASE_TABLET | Freq: Every day | ORAL | Status: DC
  Administered 2013-02-02 – 2013-02-03 (×2): 325 mg via ORAL
  Filled 2013-02-02 (×2): qty 1

## 2013-02-02 MED ORDER — BACITRACIN ZINC 500 UNIT/GM EX OINT: TOPICAL_OINTMENT | Freq: Two times a day (BID) | CUTANEOUS | Status: DC

## 2013-02-02 MED ORDER — UNABLE TO FIND: Status: DC

## 2013-02-02 MED ORDER — CEPHALEXIN 500 MG PO CAPS: 500.0000 mg | ORAL_CAPSULE | Freq: Two times a day (BID) | ORAL | Status: DC

## 2013-02-02 MED ORDER — HYDRALAZINE HCL 25 MG PO TABS: 25.0000 mg | ORAL_TABLET | Freq: Three times a day (TID) | ORAL | Status: DC

## 2013-02-02 NOTE — Progress Notes (Signed)
Patient ID: Ricky [name]  male  ZOX:[id number]    DOB: 07/13/1917    DOA: 01/27/2013  PCP: Bufford Spikes, MD  Assessment/Plan:    Gram negative UTI (urinary tract infection) - Repeat urine culture still showing more than 100,000 colonies of gram-negative rods, continue IV Rocephin. - I again called [facility] lab [phone]) today and they reported that final results are still pending. I called patient's daughter, [name] and offered to follow-up on the cultures today and tomorrow if he is discharged and called in the prescription for antibiotics if he is resistant to Keflex. However, [name] requested patient to stay until final results - follows Dr Brunilda Payor (urology)   ?Leaning towards right with History of CVA in 2013 - [name] also mentioned that when they( she and her brother) visited the patient last night, they felt that he was leaning towards the right and they want him to be ruled out for ac CVA. I explained to the patient's daughter that I did not notice him leaning towards any particular side (confirmed with RN) however MRI would be the only confirmatory test and it may be difficult to get an MRI given his progressive weakness and kyphotic state. - cont ASA and ordered MRI brain      FTT (failure to thrive) in adult - Progressive weakness over the months, dehydration, UTI, dementia, progressive neurological disease, PT evaluation done, recommending NHP as patient is total assist, but family declined     Progressive Neurological disease: as #2, PT/OT eval rec nursing home placement, although per SW, patient's son/daughter declined NH placement    Dysphagia - Tolerating dysphagia 1 diet    Dementia continue Namenda  Hypothyroidism: Continue Synthroid  DVT Prophylaxis:  Code Status:DO NOT RESUSCITATE status   Disposition: Discussed in detail with patient's daughter, [name] [phone]).    Subjective: Denies any specific complaints.    Objective: Weight change:   Intake/Output  Summary (Last 24 hours) at 02/02/13 1356 Last data filed at 02/02/13 1300  Gross per 24 hour  Intake 1634.17 ml  Output   1200 ml  Net 434.17 ml   Blood pressure 131/87, pulse 77, temperature 98.3 F (36.8 C), temperature source Oral, resp. rate 16, height 6\' 1"  (1.854 m), SpO2 96.00%.  Physical Exam: General: Alert, NAD CVS: S1-S2 clear Chest: CTAB Abdomen: soft NT, ND, NBS Extremities: Contractures in the UE GU: foley +   Lab Results: Basic Metabolic Panel:  Recent Labs Lab 01/27/13 1055 01/27/13 1542 01/28/13 0520  NA 139  --  139  K 4.1  --  3.9  CL 101  --  104  CO2 26  --  28  GLUCOSE 99  --  97  BUN 12  --  11  CREATININE 0.92 0.85 0.85  CALCIUM 9.7  --  8.8  MG  --  2.0  --    Liver Function Tests:  Recent Labs Lab 01/27/13 1055 01/28/13 0520  AST 19 15  ALT 15 11  ALKPHOS 93 76  BILITOT 0.5 0.5  PROT 8.6* 7.0  ALBUMIN 3.6 2.9*   No results found for this basename: LIPASE, AMYLASE,  in the last 168 hours No results found for this basename: AMMONIA,  in the last 168 hours CBC:  Recent Labs Lab 01/27/13 1055 01/27/13 1542 01/28/13 0520  WBC 8.2 7.4 6.0  NEUTROABS 5.8  --   --   HGB 13.1 12.3* 11.1*  HCT 40.1 38.1* 34.8*  MCV 87.6 87.2 86.8  PLT 221 213 190  Micro Results: Recent Results (from the past 240 hour(s))  CULTURE, BLOOD (ROUTINE X 2)     Status: None   Collection Time    01/27/13 11:10 AM      Result Value Range Status   Specimen Description BLOOD HAND LEFT   Final   Special Requests BOTTLES DRAWN AEROBIC ONLY 5CC   Final   Culture  Setup Time 01/27/2013 17:18   Final   Culture NO GROWTH 5 DAYS   Final   Report Status 02/02/2013 FINAL   Final  CULTURE, BLOOD (ROUTINE X 2)     Status: None   Collection Time    01/27/13 11:20 AM      Result Value Range Status   Specimen Description BLOOD HAND RIGHT   Final   Special Requests BOTTLES DRAWN AEROBIC ONLY 5CC   Final   Culture  Setup Time 01/27/2013 17:18   Final    Culture NO GROWTH 5 DAYS   Final   Report Status 02/02/2013 FINAL   Final  URINE CULTURE     Status: None   Collection Time    01/27/13 12:00 PM      Result Value Range Status   Specimen Description URINE, CATHETERIZED   Final   Special Requests NONE   Final   Culture  Setup Time 01/27/2013 12:54   Final   Colony Count >=100,000 COLONIES/ML   Final   Culture     Final   Value: Multiple bacterial morphotypes present, none predominant. Suggest appropriate recollection if clinically indicated.   Report Status 01/28/2013 FINAL   Final  MRSA PCR SCREENING     Status: None   Collection Time    01/28/13  2:17 PM      Result Value Range Status   MRSA by PCR NEGATIVE  NEGATIVE Final   Comment:            The GeneXpert MRSA Assay (FDA     approved for NASAL specimens     only), is one component of a     comprehensive MRSA colonization     surveillance program. It is not     intended to diagnose MRSA     infection nor to guide or     monitor treatment for     MRSA infections.  URINE CULTURE     Status: None   Collection Time    01/30/13  2:57 AM      Result Value Range Status   Specimen Description URINE, RANDOM   Final   Special Requests NONE   Final   Culture  Setup Time 01/30/2013 14:57   Final   Colony Count >=100,000 COLONIES/ML   Final   Culture GRAM NEGATIVE RODS   Final   Report Status PENDING   Incomplete    Studies/Results: Dg Chest 1 View  01/27/2013  *RADIOLOGY REPORT*  Clinical Data: Altered mental status.  CHEST - 1 VIEW  Comparison: 09/07/2012.  Findings: Trachea is midline.  Heart is enlarged, stable.  Thoracic aorta is calcified.  Mild chronic changes are seen in the lungs without air space consolidation or pleural fluid. Probable nodular summation shadow in the right suprahilar region.  IMPRESSION:  1.  No acute findings. 2.  Probable nodular summation shadow in the right suprahilar region.  Continued attention on follow-up exams is warranted.   Original Report  Authenticated By: Leanna Battles, M.D.     Medications: Scheduled Meds: . acetaminophen  500 mg Oral BID  . aspirin EC  325 mg Oral Daily  . atorvastatin  10 mg Oral q1800  . bacitracin   Topical BID  . calcium-vitamin D  1 tablet Oral BID WC  . cefTRIAXone (ROCEPHIN)  IV  1 g Intravenous Q24H  . ferrous sulfate  325 mg Oral Q breakfast  . heparin  5,000 Units Subcutaneous Q8H  . hydrALAZINE  25 mg Oral Q8H  . lactose free nutrition  0.5 Container Oral BID BM  . levothyroxine  25 mcg Oral Q breakfast  . memantine  10 mg Oral BID  . multivitamin with minerals  1 tablet Oral Daily  . polyethylene glycol  17 g Oral Daily  . Rotigotine 3 mg, rotigotine (NEUPRO) 2 MG/24HR   Transdermal QHS  . sodium chloride  3 mL Intravenous Q12H  . vitamin B-12  1,000 mcg Oral Daily      LOS: 6 days   Menashe Kafer M.D. [facility] 02/02/2013, 1:56 PM Pager: [phone]  If 7PM-7AM, please contact night-coverage [email or web address] Password [email or web address]

## 2013-02-02 NOTE — Progress Notes (Signed)
Mri called. They are unable to perform mri because patient cannot lie head down. md notified. Orders received.

## 2013-02-02 NOTE — Clinical Documentation Improvement (Signed)
GENERIC DOCUMENTATION CLARIFICATION QUERY  THIS DOCUMENT IS NOT A PERMANENT PART OF THE MEDICAL RECORD  TO RESPOND TO THE THIS QUERY, FOLLOW THE INSTRUCTIONS BELOW:  1. If needed, update documentation for the patient's encounter via the notes activity.  2. Access this query again and click edit on the In [employer].  3. After updating, or not, click F2 to complete all highlighted (required) fields concerning your review. Select "additional documentation in the medical record" OR "no additional documentation provided".  4. Click Sign note button.  5. The deficiency will fall out of your In Basket *Please let us know if you are not able to complete this workflow by phone or e-mail (listed below).  Please update your documentation within the medical record to reflect your response to this query.                                                                                        02/02/13   Dear Dr.[facility],  In a better effort to capture your patient's severity of illness, reflect appropriate length of stay and utilization of resources, a review of the patient medical record has revealed the following indicators. Based on your clinical judgment, please clarify and document in a progress note and/or discharge summary the clinical condition associated with the following supporting information: In responding to this query please exercise your independent judgment.  The fact that a query is asked, does not imply that any particular answer is desired or expected.  Possible Clinical Conditions?  Functional Quadriplegia \\Other  Condition_ \\Cannot  Clinically Determine   Supporting Information:  Risk Factors: alzheimer's dementia, progressive neurological disease  Signs & Symptoms:Extremities: Contractures in the upper extremities; total assist   Diagnostics: assessments  Treatment: power lift chair; total assist  You may use possible, probable, or suspect with inpatient  documentation. possible, probable, suspected diagnoses MUST be documented at the time of discharge  Reviewed reviewed and cannot clinically determine if patient has functional quadriplegia. he has progressive supranuclear neurology disease  Thank You,  Amada Kingfisher  RN, BSN, CCM [phone] [email or web address] Clinical Documentation Specialist: Health Information Management [place]

## 2013-02-03 ENCOUNTER — Telehealth: Payer: Self-pay | Admitting: Internal Medicine

## 2013-02-03 ENCOUNTER — Other Ambulatory Visit: Payer: Self-pay | Admitting: Geriatric Medicine

## 2013-02-03 DIAGNOSIS — K5900 Constipation, unspecified: Secondary | ICD-10-CM

## 2013-02-03 DIAGNOSIS — R55 Syncope and collapse: Secondary | ICD-10-CM

## 2013-02-03 DIAGNOSIS — F0280 Dementia in other diseases classified elsewhere without behavioral disturbance: Secondary | ICD-10-CM

## 2013-02-03 DIAGNOSIS — G20A1 Parkinson's disease without dyskinesia, without mention of fluctuations: Secondary | ICD-10-CM

## 2013-02-03 DIAGNOSIS — G459 Transient cerebral ischemic attack, unspecified: Secondary | ICD-10-CM

## 2013-02-03 DIAGNOSIS — R32 Unspecified urinary incontinence: Secondary | ICD-10-CM

## 2013-02-03 DIAGNOSIS — G20 Parkinson's disease: Secondary | ICD-10-CM

## 2013-02-03 LAB — URINE CULTURE

## 2013-02-03 MED ORDER — OXYCODONE HCL 5 MG PO TABS: 5.0000 mg | ORAL_TABLET | Freq: Three times a day (TID) | ORAL | Status: DC | PRN

## 2013-02-03 MED ORDER — CIPROFLOXACIN HCL 500 MG PO TABS: 500.0000 mg | ORAL_TABLET | Freq: Two times a day (BID) | ORAL | Status: DC

## 2013-02-03 MED ORDER — ASPIRIN 325 MG PO TBEC: 325.0000 mg | DELAYED_RELEASE_TABLET | Freq: Every day | ORAL | Status: DC

## 2013-02-03 NOTE — Telephone Encounter (Signed)
New problem   Per pts son reginald- he wants to know if it was determined if the pt had a stroke on 06/20/2012-per Dr. Gershon Crane, they were told that instead of the family treating pt as having TIA they should have been treating as a stroke pt b/c that what he had in August 2013. Call [phone](cell) if no answer on home #)

## 2013-02-03 NOTE — Progress Notes (Signed)
Son up to pick up his father , discharge instructions given to him with RX and fu care orders , Iv was D/Ced and Conservation officer, nature with dsg patient  Son waiting for his ride to come

## 2013-02-03 NOTE — Discharge Summary (Signed)
Physician Discharge Summary  Patient ID: Ricky [name] MRN: [id number] DOB/AGE: March 14, 1917 79 y.o.  Admit date: 01/27/2013 Discharge date: 02/03/2013  Primary Care Physician:  Bufford Spikes, MD  Discharge Diagnoses:    . FTT (failure to thrive) in adult . progressive supranuclear palsy/Neurological disease . Dysphagia on dysphagia 1 diet  . advanced Dementia . Serratia UTI (urinary tract infection) Neurogenic bladder Generalized debility with the total assistance   Consults:  None    Discharge Medications:   Medication List    TAKE these medications       acetaminophen 500 MG tablet  Commonly known as:  TYLENOL  Take 500 mg by mouth 2 (two) times daily.     aspirin 325 MG EC tablet  Take 1 tablet (325 mg total) by mouth daily.     atorvastatin 10 MG tablet  Commonly known as:  LIPITOR  Take 1 tablet (10 mg total) by mouth daily at 6 PM.     bacitracin ointment  Apply topically 2 (two) times daily. Apply to penis     CALTRATE 600+D PLUS 600-800 MG-UNIT Chew  Chew 1 tablet by mouth 2 (two) times daily with a meal.     cetirizine 10 MG tablet  Commonly known as:  ZYRTEC  Take 10 mg by mouth daily as needed. For allergies     ciprofloxacin 500 MG tablet  Commonly known as:  CIPRO  Take 1 tablet (500 mg total) by mouth 2 (two) times daily. For 4 more days     Cranberry 500 MG Chew  Chew 1 tablet by mouth daily.     ferrous sulfate 325 (65 FE) MG tablet  Take 325 mg by mouth daily with breakfast.     guaiFENesin 600 MG 12 hr tablet  Commonly known as:  MUCINEX  Take 1,200 mg by mouth 2 (two) times daily as needed for congestion.     hydrALAZINE 25 MG tablet  Commonly known as:  APRESOLINE  Take 1 tablet (25 mg total) by mouth 3 (three) times daily.     ICAPS AREDS FORMULA PO  Take 1 capsule by mouth 2 (two) times daily with a meal.     lactose free nutrition Liqd  Take 0.5 Containers by mouth 2 (two) times daily between meals.     levothyroxine 25  MCG tablet  Commonly known as:  SYNTHROID, LEVOTHROID  Take 25 mcg by mouth daily.     MELATONIN EXTRA STRENGTH PO  Take 15 mLs by mouth at bedtime as needed. To help sleep     memantine 10 MG tablet  Commonly known as:  NAMENDA  Take 10 mg by mouth 2 (two) times daily.     multivitamin with minerals Tabs  Take 1 tablet by mouth daily.     oxyCODONE 5 MG immediate release tablet  Commonly known as:  Oxy IR/ROXICODONE  Take 1 tablet (5 mg total) by mouth every 8 (eight) hours as needed for pain.     polyethylene glycol packet  Commonly known as:  MIRALAX / GLYCOLAX  Take 17 g by mouth daily.     Rotigotine 3 MG/24HR Pt24  Place 1 patch onto the skin daily.     UNABLE TO FIND  DME POWER LIFT CHAIR      Diagnosis: Progressive supranuclear palsy, dementia with generalized debility, total assist assistance     VITAMIN B-12 PO  Take 5,000 mcg by mouth daily.         Brief H and  P: For complete details please refer to admission H and P, but in brief73 year old male, with known history of IBS, diverticulosis, cataracts, macular degeneration, overactive bladder, s/p chronic indwelling Foley catheter, Alzheimer's disease, OA, osteoporosis, suspected parkinson-like syndrome, now diagnosed to be progressive supra-nuclear palsy, by Dr Avie Echevaria, recurrent syncope, depression, gout, constipation, hypothyroidism. Patient's son/HPOA, [name] (tel: [phone]), who was at the bedside, supplied the history, and states that patient has experienced gradual decline for over a year, but in the past 4 days, he has not been able to assist himself with sitting upright or transferring from the bed to the chair, has not been speaking as much or making as much eye contact and appears to "just slump". It took patient great effort to eat on the day of admission and he could barely lift a cup to his lips to drink, and he appeared quite lethargic. Over the past 3 weeks, patient has had an increasing  cough, and progressive difficulty swallowing liquids. He consumed a regular diet, but right now, family is attempting to give him thicker liquids, in the hope that he will tolerate these better. He has had no fever, or shortness of breath. , and since 11/30/12, has had indwelling Foley catheter, for urinary retention, having failed a few voiding trial. In the ED, urinalysis was found to be positive for pyuria/bacteriuria.    Hospital Course:  Serratia marcescens UTI (urinary tract infection). Patient was placed on IV Rocephin, final cultures reported Serratia marcescens sensitive to Rocephin and the ciprofloxacin. We'll continue 4 more days of oral ciprofloxacin to complete the course. Patient follows urology, Dr. Brunilda Payor, I recommended the family to followup with urology for possibility of prophylactic antibiotics to avoid recurrent UTIs.   ?Leaning towards right (per family's report) with History of CVA in 2013: CT head was done as due to patient's progressive weakness and kyphotic state he was not able to lie flat for the MRI. CT head was negative for any acute CVA. Patient was continued on aspirin. He is currently at his baseline.  FTT (failure to thrive) in adult: Progressive weakness over the months, dehydration, UTI, dementia, progressive neurological disease, PT evaluation done, recommended NHP as patient is total assist, but family declined. Home PT, RN was arranged.  Dysphagia: Speech therapy was consulted and patient underwent swallow evaluation in, he is tolerating dysphagia 1 diet with aspiration precautions and instructions as below. He needs total assistance with feeding.  Alzheimer's Dementia continue Namenda   Hypothyroidism: Continue Synthroid    Day of Discharge BP 142/83  Pulse 69  Temp(Src) 99.3 F (37.4 C) (Oral)  Resp 18  Ht 6\' 1"  (1.854 m)  SpO2 96%  Physical Exam:  General: Alert, NAD  CVS: S1-S2 clear  Chest: CTAB  Abdomen: soft NT, ND, NBS  Extremities:  Stiffness/contractures in the UE  GU: foley +   The results of significant diagnostics from this hospitalization (including imaging, microbiology, ancillary and laboratory) are listed below for reference.    LAB RESULTS: Basic Metabolic Panel:  Recent Labs Lab 01/27/13 1542 01/28/13 0520  NA  --  139  K  --  3.9  CL  --  104  CO2  --  28  GLUCOSE  --  97  BUN  --  11  CREATININE 0.85 0.85  CALCIUM  --  8.8  MG 2.0  --    Liver Function Tests:  Recent Labs Lab 01/28/13 0520  AST 15  ALT 11  ALKPHOS 76  BILITOT 0.5  PROT 7.0  ALBUMIN 2.9*   No results found for this basename: LIPASE, AMYLASE,  in the last 168 hours No results found for this basename: AMMONIA,  in the last 168 hours CBC:  Recent Labs Lab 01/27/13 1542 01/28/13 0520  WBC 7.4 6.0  HGB 12.3* 11.1*  HCT 38.1* 34.8*  MCV 87.2 86.8  PLT 213 190   Cardiac Enzymes: No results found for this basename: CKTOTAL, CKMB, CKMBINDEX, TROPONINI,  in the last 168 hours BNP: No components found with this basename: POCBNP,  CBG: No results found for this basename: GLUCAP,  in the last 168 hours  Significant Diagnostic Studies:  Dg Chest 1 View  01/27/2013  *RADIOLOGY REPORT*  Clinical Data: Altered mental status.  CHEST - 1 VIEW  Comparison: 09/07/2012.  Findings: Trachea is midline.  Heart is enlarged, stable.  Thoracic aorta is calcified.  Mild chronic changes are seen in the lungs without air space consolidation or pleural fluid. Probable nodular summation shadow in the right suprahilar region.  IMPRESSION:  1.  No acute findings. 2.  Probable nodular summation shadow in the right suprahilar region.  Continued attention on follow-up exams is warranted.   Original Report Authenticated By: Leanna Battles, M.D.     2D ECHO:   Disposition and Follow-up:     Discharge Orders   Future Appointments Provider Department Dept Phone   02/10/2013 8:15 AM [facility] [phone]   02/14/2013  10:00 AM Kermit Balo, MD [facility] [phone]   Future Orders Complete By Expires     Discharge instructions  As directed     Comments:      Discharge diet: Dysphagia 1 diet with aspiration precautions.  Crush meds, stay sitting up 45 min after meals due to esophageal deficits    Discharge wound care:  As directed     Comments:      Bacitracin cream- apply BID to penis    Increase activity slowly  As directed         DISPOSITION: Home DIET: Dysphagia 1 diet with thin liquids DISCHARGE FOLLOW-UP Follow-up Information   Follow up with Bufford Spikes, MD. Schedule an appointment as soon as possible for a visit in 2 weeks.   Contact information:   [address] [place] [address] [phone]       Follow up with NESI,MARC-HENRY, MD. Schedule an appointment as soon as possible for a visit in 2 weeks.   Contact information:   [address] [place] [address] [phone]       Time spent on Discharge: 40 minutes  Signed:   RAI,RIPUDEEP M.D. [facility] 02/03/2013, 1:07 PM Pager: [phone]

## 2013-02-04 NOTE — Telephone Encounter (Signed)
F/u   Patient son Tiburcio Bash calling per previous conversation 3/20, he can be reached at [phone] home or [phone].

## 2013-02-04 NOTE — Telephone Encounter (Signed)
Dr. Isidoro Donning thinks pt needs to be on more Hydralazine.  Ann Maki wants Dr. Tenny Craw' opinion.

## 2013-02-08 DIAGNOSIS — R1310 Dysphagia, unspecified: Secondary | ICD-10-CM

## 2013-02-08 DIAGNOSIS — R627 Adult failure to thrive: Secondary | ICD-10-CM

## 2013-02-08 DIAGNOSIS — G238 Other specified degenerative diseases of basal ganglia: Secondary | ICD-10-CM

## 2013-02-08 DIAGNOSIS — Z466 Encounter for fitting and adjustment of urinary device: Secondary | ICD-10-CM

## 2013-02-10 ENCOUNTER — Other Ambulatory Visit: Payer: Self-pay

## 2013-02-10 DIAGNOSIS — R55 Syncope and collapse: Secondary | ICD-10-CM

## 2013-02-10 DIAGNOSIS — G459 Transient cerebral ischemic attack, unspecified: Secondary | ICD-10-CM

## 2013-02-11 ENCOUNTER — Encounter (HOSPITAL_COMMUNITY): Payer: Self-pay | Admitting: Emergency Medicine

## 2013-02-11 ENCOUNTER — Inpatient Hospital Stay (HOSPITAL_COMMUNITY)
Admission: EM | Admit: 2013-02-11 | Discharge: 2013-02-15 | DRG: 689 | Disposition: A | Discharge status: Home Health | Payer: Medicare HMO | Attending: Internal Medicine | Admitting: Internal Medicine

## 2013-02-11 ENCOUNTER — Emergency Department (HOSPITAL_COMMUNITY): Payer: Medicare HMO

## 2013-02-11 DIAGNOSIS — I639 Cerebral infarction, unspecified: Secondary | ICD-10-CM

## 2013-02-11 DIAGNOSIS — E039 Hypothyroidism, unspecified: Secondary | ICD-10-CM | POA: Diagnosis present

## 2013-02-11 DIAGNOSIS — Z8673 Personal history of transient ischemic attack (TIA), and cerebral infarction without residual deficits: Secondary | ICD-10-CM

## 2013-02-11 DIAGNOSIS — F0390 Unspecified dementia without behavioral disturbance: Secondary | ICD-10-CM

## 2013-02-11 DIAGNOSIS — R627 Adult failure to thrive: Secondary | ICD-10-CM | POA: Diagnosis present

## 2013-02-11 DIAGNOSIS — N400 Enlarged prostate without lower urinary tract symptoms: Secondary | ICD-10-CM

## 2013-02-11 DIAGNOSIS — I517 Cardiomegaly: Secondary | ICD-10-CM | POA: Diagnosis present

## 2013-02-11 DIAGNOSIS — G9341 Metabolic encephalopathy: Secondary | ICD-10-CM | POA: Diagnosis present

## 2013-02-11 DIAGNOSIS — J189 Pneumonia, unspecified organism: Secondary | ICD-10-CM | POA: Diagnosis present

## 2013-02-11 DIAGNOSIS — F0280 Dementia in other diseases classified elsewhere without behavioral disturbance: Secondary | ICD-10-CM | POA: Diagnosis present

## 2013-02-11 DIAGNOSIS — N390 Urinary tract infection, site not specified: Principal | ICD-10-CM | POA: Diagnosis present

## 2013-02-11 DIAGNOSIS — I10 Essential (primary) hypertension: Secondary | ICD-10-CM | POA: Diagnosis present

## 2013-02-11 DIAGNOSIS — M109 Gout, unspecified: Secondary | ICD-10-CM | POA: Diagnosis present

## 2013-02-11 DIAGNOSIS — G3183 Dementia with Lewy bodies: Secondary | ICD-10-CM | POA: Diagnosis present

## 2013-02-11 DIAGNOSIS — G309 Alzheimer's disease, unspecified: Secondary | ICD-10-CM | POA: Diagnosis present

## 2013-02-11 DIAGNOSIS — A4902 Methicillin resistant Staphylococcus aureus infection, unspecified site: Secondary | ICD-10-CM | POA: Diagnosis present

## 2013-02-11 DIAGNOSIS — R531 Weakness: Secondary | ICD-10-CM | POA: Diagnosis present

## 2013-02-11 DIAGNOSIS — Y95 Nosocomial condition: Secondary | ICD-10-CM

## 2013-02-11 DIAGNOSIS — R4182 Altered mental status, unspecified: Secondary | ICD-10-CM

## 2013-02-11 DIAGNOSIS — R1310 Dysphagia, unspecified: Secondary | ICD-10-CM

## 2013-02-11 DIAGNOSIS — G238 Other specified degenerative diseases of basal ganglia: Secondary | ICD-10-CM | POA: Diagnosis present

## 2013-02-11 DIAGNOSIS — N319 Neuromuscular dysfunction of bladder, unspecified: Secondary | ICD-10-CM | POA: Diagnosis present

## 2013-02-11 DIAGNOSIS — IMO0002 Reserved for concepts with insufficient information to code with codable children: Secondary | ICD-10-CM | POA: Diagnosis present

## 2013-02-11 DIAGNOSIS — H3530 Unspecified macular degeneration: Secondary | ICD-10-CM | POA: Diagnosis present

## 2013-02-11 DIAGNOSIS — Z79899 Other long term (current) drug therapy: Secondary | ICD-10-CM

## 2013-02-11 DIAGNOSIS — R509 Fever, unspecified: Secondary | ICD-10-CM | POA: Diagnosis present

## 2013-02-11 HISTORY — DX: Progressive supranuclear ophthalmoplegia (steele-Richardson-olszewski): G23.1

## 2013-02-11 LAB — BASIC METABOLIC PANEL
BUN: 9 mg/dL (ref 6–23)
CO2: 26 mEq/L (ref 19–32)
Calcium: 9.1 mg/dL (ref 8.4–10.5)
Chloride: 99 mEq/L (ref 96–112)
Creatinine, Ser: 0.81 mg/dL (ref 0.50–1.35)
GFR calc Af Amer: 86 mL/min — ABNORMAL LOW (ref >90)
GFR calc non Af Amer: 74 mL/min — ABNORMAL LOW (ref >90)
Glucose, Bld: 100 mg/dL — ABNORMAL HIGH (ref 70–99)
Potassium: 4 mEq/L (ref 3.5–5.1)
Sodium: 134 mEq/L — ABNORMAL LOW (ref 135–145)

## 2013-02-11 LAB — CBC WITH DIFFERENTIAL/PLATELET
Basophils Absolute: 0 10*3/uL (ref 0.0–0.2)
Basophils Absolute: 0 10*3/uL (ref 0.0–0.1)
Basophils Relative: 0 % (ref 0–1)
Eosinophils Absolute: 0.2 10*3/uL (ref 0.0–0.4)
Eosinophils Absolute: 0.1 10*3/uL (ref 0.0–0.7)
Eosinophils Relative: 1 % (ref 0–5)
HCT: 34.7 % — ABNORMAL LOW (ref 39.0–52.0)
Hemoglobin: 11.5 g/dL — ABNORMAL LOW (ref 12.6–17.7)
Hemoglobin: 11.2 g/dL — ABNORMAL LOW (ref 13.0–17.0)
Lymphocytes Relative: 14 % (ref 12–46)
Lymphs Abs: 1.2 10*3/uL (ref 0.7–4.0)
Lymphs: 22 % (ref 14–46)
MCH: 28 pg (ref 26.6–33.0)
MCH: 27.9 pg (ref 26.0–34.0)
MCHC: 32.6 g/dL (ref 31.5–35.7)
MCHC: 32.3 g/dL (ref 30.0–36.0)
MCV: 86.3 fL (ref 78.0–100.0)
Monocytes Absolute: 0.5 10*3/uL (ref 0.1–0.9)
Monocytes Absolute: 0.6 10*3/uL (ref 0.1–1.0)
Monocytes Relative: 7 % (ref 3–12)
Neutro Abs: 7 10*3/uL (ref 1.7–7.7)
Neutrophils Absolute: 3.8 10*3/uL (ref 1.4–7.0)
Neutrophils Relative %: 66 % (ref 40–74)
Neutrophils Relative %: 79 % — ABNORMAL HIGH (ref 43–77)
Platelets: 266 10*3/uL (ref 150–400)
RBC: 4.02 MIL/uL — ABNORMAL LOW (ref 4.22–5.81)
RDW: 13.4 % (ref 11.5–15.5)
WBC: 8.8 10*3/uL (ref 4.0–10.5)

## 2013-02-11 LAB — PHOSPHORUS: Phosphorus: 2.8 mg/dL (ref 2.3–4.6)

## 2013-02-11 LAB — COMPREHENSIVE METABOLIC PANEL
AST: 21 IU/L (ref 0–40)
Albumin/Globulin Ratio: 1.1 (ref 1.1–2.5)
Alkaline Phosphatase: 89 IU/L (ref 39–117)
BUN/Creatinine Ratio: 13 (ref 10–22)
Creatinine, Ser: 0.89 mg/dL (ref 0.76–1.27)
GFR calc Af Amer: 85 mL/min/{1.73_m2} (ref >59)
GFR calc non Af Amer: 74 mL/min/{1.73_m2} (ref >59)
Globulin, Total: 3.7 g/dL (ref 1.5–4.5)
Sodium: 141 mmol/L (ref 134–144)

## 2013-02-11 LAB — URINALYSIS, ROUTINE W REFLEX MICROSCOPIC
Bilirubin Urine: NEGATIVE
Glucose, UA: NEGATIVE mg/dL
Ketones, ur: NEGATIVE mg/dL
Nitrite: POSITIVE — AB
Protein, ur: 100 mg/dL — AB
Specific Gravity, Urine: 1.023 (ref 1.005–1.030)
Urobilinogen, UA: 0.2 mg/dL (ref 0.0–1.0)
pH: 8 (ref 5.0–8.0)

## 2013-02-11 LAB — URINE MICROSCOPIC-ADD ON

## 2013-02-11 LAB — CG4 I-STAT (LACTIC ACID): Lactic Acid, Venous: 1.2 mmol/L (ref 0.5–2.2)

## 2013-02-11 MED ORDER — ENOXAPARIN SODIUM 30 MG/0.3ML ~~LOC~~ SOLN: 30.0000 mg | SUBCUTANEOUS | Status: DC

## 2013-02-11 MED ORDER — VANCOMYCIN HCL 1000 MG IV SOLR
750.0000 mg | Freq: Two times a day (BID) | INTRAVENOUS | Status: DC
  Administered 2013-02-12 (×3): 750 mg via INTRAVENOUS
  Filled 2013-02-11 (×4): qty 750

## 2013-02-11 MED ORDER — LEVOFLOXACIN IN D5W 750 MG/150ML IV SOLN: 750.0000 mg | INTRAVENOUS | Status: DC

## 2013-02-11 MED ORDER — ACETAMINOPHEN 500 MG PO TABS
500.0000 mg | ORAL_TABLET | Freq: Two times a day (BID) | ORAL | Status: DC
  Administered 2013-02-11 – 2013-02-15 (×8): 500 mg via ORAL
  Filled 2013-02-11 (×12): qty 1

## 2013-02-11 MED ORDER — HYDRALAZINE HCL 25 MG PO TABS
25.0000 mg | ORAL_TABLET | Freq: Three times a day (TID) | ORAL | Status: DC
  Administered 2013-02-11 – 2013-02-15 (×11): 25 mg via ORAL
  Filled 2013-02-11 (×14): qty 1

## 2013-02-11 MED ORDER — DEXTROSE 5 % IV SOLN
1.0000 g | Freq: Two times a day (BID) | INTRAVENOUS | Status: DC
  Administered 2013-02-11 – 2013-02-14 (×6): 1 g via INTRAVENOUS
  Filled 2013-02-11 (×7): qty 1

## 2013-02-11 MED ORDER — ASPIRIN EC 325 MG PO TBEC
325.0000 mg | DELAYED_RELEASE_TABLET | Freq: Every day | ORAL | Status: DC
  Administered 2013-02-12 – 2013-02-15 (×4): 325 mg via ORAL
  Filled 2013-02-11 (×4): qty 1

## 2013-02-11 MED ORDER — ENOXAPARIN SODIUM 40 MG/0.4ML ~~LOC~~ SOLN
40.0000 mg | SUBCUTANEOUS | Status: DC
  Administered 2013-02-11 – 2013-02-14 (×4): 40 mg via SUBCUTANEOUS
  Filled 2013-02-11 (×5): qty 0.4

## 2013-02-11 MED ORDER — POLYETHYLENE GLYCOL 3350 17 G PO PACK
17.0000 g | PACK | Freq: Every day | ORAL | Status: DC
  Administered 2013-02-12 – 2013-02-15 (×4): 17 g via ORAL
  Filled 2013-02-11 (×4): qty 1

## 2013-02-11 MED ORDER — VANCOMYCIN HCL 1000 MG IV SOLR
750.0000 mg | Freq: Two times a day (BID) | INTRAVENOUS | Status: DC
  Filled 2013-02-11 (×2): qty 750

## 2013-02-11 MED ORDER — ATORVASTATIN CALCIUM 10 MG PO TABS
10.0000 mg | ORAL_TABLET | Freq: Every day | ORAL | Status: DC
  Administered 2013-02-12 – 2013-02-14 (×3): 10 mg via ORAL
  Filled 2013-02-11 (×4): qty 1

## 2013-02-11 MED ORDER — LEVOFLOXACIN IN D5W 750 MG/150ML IV SOLN
750.0000 mg | INTRAVENOUS | Status: AC
  Administered 2013-02-11 – 2013-02-13 (×2): 750 mg via INTRAVENOUS
  Filled 2013-02-11 (×2): qty 150

## 2013-02-11 MED ORDER — HYDROCODONE-ACETAMINOPHEN 5-325 MG PO TABS: 1.0000 | ORAL_TABLET | ORAL | Status: DC | PRN

## 2013-02-11 MED ORDER — BOOST PO LIQD
0.5000 | Freq: Two times a day (BID) | ORAL | Status: DC
  Administered 2013-02-12 – 2013-02-13 (×2): 119 mL via ORAL
  Filled 2013-02-11 (×4): qty 237

## 2013-02-11 MED ORDER — SODIUM CHLORIDE 0.9 % IJ SOLN
3.0000 mL | Freq: Two times a day (BID) | INTRAMUSCULAR | Status: DC
  Administered 2013-02-14: 3 mL via INTRAVENOUS

## 2013-02-11 MED ORDER — ONDANSETRON HCL 4 MG/2ML IJ SOLN: 4.0000 mg | Freq: Four times a day (QID) | INTRAMUSCULAR | Status: DC | PRN

## 2013-02-11 MED ORDER — LEVOFLOXACIN IN D5W 500 MG/100ML IV SOLN: 500.0000 mg | Freq: Once | INTRAVENOUS | Status: DC

## 2013-02-11 MED ORDER — MEMANTINE HCL 10 MG PO TABS
10.0000 mg | ORAL_TABLET | Freq: Two times a day (BID) | ORAL | Status: DC
  Administered 2013-02-11 – 2013-02-15 (×8): 10 mg via ORAL
  Filled 2013-02-11 (×10): qty 1

## 2013-02-11 MED ORDER — DEXTROSE 5 % IV SOLN
1.0000 g | Freq: Once | INTRAVENOUS | Status: AC
  Administered 2013-02-11: 1 g via INTRAVENOUS
  Filled 2013-02-11: qty 10

## 2013-02-11 MED ORDER — ONDANSETRON HCL 4 MG PO TABS: 4.0000 mg | ORAL_TABLET | Freq: Four times a day (QID) | ORAL | Status: DC | PRN

## 2013-02-11 MED ORDER — LEVOTHYROXINE SODIUM 25 MCG PO TABS
25.0000 ug | ORAL_TABLET | Freq: Every day | ORAL | Status: DC
  Administered 2013-02-12 – 2013-02-15 (×4): 25 ug via ORAL
  Filled 2013-02-11 (×5): qty 1

## 2013-02-11 NOTE — ED Provider Notes (Signed)
History     89yM brought in by son for evaluation of decreased mental status. Waxes and wanes but today pt sat at table for lunch with head slumped almost in plate of food and did not eat which is unusual for him. Needed much more assistance than typically does when ambulated. No trauma that son is aware of. No fever. No new meds. Hx of neurogenic bladder with foley. Unsure of when last changed.    CSN: [id number]  Arrival date & time 02/11/13  1628   First MD Initiated Contact with Patient 02/11/13 1635      Chief Complaint  Patient presents with  . Altered Mental Status    (Consider location/radiation/quality/duration/timing/severity/associated sxs/prior treatment) HPI  Past Medical History  Diagnosis Date  . IBS (irritable bowel syndrome)   . Diverticulosis   . Cataract   . OAB (overactive bladder)   . Macular degeneration   . Alzheimer disease   . Hypertension   . Arthritis   . Osteoporosis   . Parkinsonian syndrome   . Syncope, vasovagal   . Neuromuscular disorder     parkisonian syndrome  . 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 Unspecified transient cerebral ischemia   . Unspecified urinary incontinence   . Senile dementia, uncomplicated   . Unspecified constipation   . Hypothyroidism   . Anemia, unspecified   . Depression   . Alzheimer's disease   . Gout, unspecified   . Thoracic or lumbosacral neuritis or radiculitis, unspecified   . Progressive supranuclear palsy     Past Surgical History  Procedure Laterality Date  . Esophagogastroduodenoscopy    . Eye surgery  2006    LEFT CATARACT    Family History  Problem Relation Age of Onset  . Cancer Mother   . Stroke Father   . Liver disease Father   . Kidney disease Brother   . Liver disease Brother   . Emphysema Brother     History  Substance Use Topics  . Smoking status: Never Smoker   . Smokeless tobacco: Never Used  . Alcohol Use: No      Review of  Systems  All systems reviewed and negative, other than as noted in HPI.   Allergies  Review of patient's allergies indicates no known allergies.  Home Medications   Current Outpatient Rx  Name  Route  Sig  Dispense  Refill  . acetaminophen (TYLENOL) 500 MG tablet   Oral   Take 500 mg by mouth 2 (two) times daily.         Marland Kitchen aspirin EC 325 MG EC tablet   Oral   Take 1 tablet (325 mg total) by mouth daily.   30 tablet   3   . atorvastatin (LIPITOR) 10 MG tablet   Oral   Take 1 tablet (10 mg total) by mouth daily at 6 PM.   30 tablet   0   . bacitracin ointment   Topical   Apply topically 2 (two) times daily. Apply to penis   120 g   3   . Calcium Carbonate-Vit D-Min (CALTRATE 600+D PLUS) 600-800 MG-UNIT CHEW   Oral   Chew 1 tablet by mouth 2 (two) times daily with a meal.          . cetirizine (ZYRTEC) 10 MG tablet   Oral   Take 10 mg by mouth daily as needed. For allergies         . ciprofloxacin (CIPRO) 500  MG tablet   Oral   Take 1 tablet (500 mg total) by mouth 2 (two) times daily. For 4 more days   8 tablet   0   . Cranberry 500 MG CHEW   Oral   Chew 1 tablet by mouth daily.         . Cyanocobalamin (VITAMIN B-12 PO)   Oral   Take 5,000 mcg by mouth daily.         . ferrous sulfate 325 (65 FE) MG tablet   Oral   Take 325 mg by mouth daily with breakfast.         . guaiFENesin (MUCINEX) 600 MG 12 hr tablet   Oral   Take 1,200 mg by mouth 2 (two) times daily as needed for congestion.         . hydrALAZINE (APRESOLINE) 25 MG tablet   Oral   Take 1 tablet (25 mg total) by mouth 3 (three) times daily.   90 tablet   3   . lactose free nutrition (BOOST) LIQD   Oral   Take 0.5 Containers by mouth 2 (two) times daily between meals.          Marland Kitchen levothyroxine (SYNTHROID, LEVOTHROID) 25 MCG tablet   Oral   Take 25 mcg by mouth daily.         Marland Kitchen MELATONIN EXTRA STRENGTH PO   Oral   Take 15 mLs by mouth at bedtime as needed. To help  sleep         . memantine (NAMENDA) 10 MG tablet   Oral   Take 10 mg by mouth 2 (two) times daily.          . Multiple Vitamin (MULTIVITAMIN WITH MINERALS) TABS   Oral   Take 1 tablet by mouth daily.         . Multiple Vitamins-Minerals (ICAPS AREDS FORMULA PO)   Oral   Take 1 capsule by mouth 2 (two) times daily with a meal.          . oxyCODONE (OXY IR/ROXICODONE) 5 MG immediate release tablet   Oral   Take 1 tablet (5 mg total) by mouth every 8 (eight) hours as needed for pain.   30 tablet   0   . polyethylene glycol (MIRALAX / GLYCOLAX) packet   Oral   Take 17 g by mouth daily.         . Rotigotine 3 MG/24HR PT24   Transdermal   Place 1 patch onto the skin daily.         Marland Kitchen UNABLE TO FIND      DME POWER LIFT CHAIR   Diagnosis: Progressive supranuclear palsy, dementia with generalized debility, total assist assistance   1 Mutually Defined   0     There were no vitals taken for this visit.  Physical Exam  Nursing note and vitals reviewed. Constitutional: He appears well-developed and well-nourished. No distress.  HENT:  Head: Normocephalic and atraumatic.  Eyes: Conjunctivae are normal. Pupils are equal, round, and reactive to light. Right eye exhibits no discharge. Left eye exhibits no discharge.  Neck:  Neck held in flexion, per son this is baseline  Cardiovascular: Normal rate, regular rhythm and normal heart sounds.  Exam reveals no gallop and no friction rub.   No murmur heard. Pulmonary/Chest: Effort normal and breath sounds normal. No respiratory distress.  Abdominal: Soft. He exhibits no distension. There is no tenderness.  Musculoskeletal: He exhibits no edema and no tenderness.  Neurological: He is alert.  Disoriented to time. Follows basic commands. Diffuse increase in muscle tone. CN appears intact. No focal weakness noted.   Skin: Skin is warm and dry.  Psychiatric: He has a normal mood and affect. His behavior is normal. Thought  content normal.    ED Course  Procedures (including critical care time)  Labs Reviewed - No data to display Dg Chest 2 View  02/13/2013  *RADIOLOGY REPORT*  Clinical Data: Weakness and shortness of breath.  CHEST - 2 VIEW  Comparison: 02/11/2013.  Findings: Stable enlarged cardiac silhouette.  The patient's chin is obscuring the medial lung apices.  The remainder of the lungs are clear.  Diffuse osteopenia.  IMPRESSION: No acute abnormality.   Original Report Authenticated By: Beckie Salts, M.D.    Dg Chest Portable 1 View  02/11/2013  *RADIOLOGY REPORT*  Clinical Data: Fever.  Altered mental status.  Urinary tract infection.  PORTABLE CHEST - 1 VIEW  Comparison: 01/27/2013  Findings: Mild cardiomegaly noted without edema.  Vague densities in the lingula and right midlung noted.  There is atherosclerotic calcification of the aortic arch.  IMPRESSION:  1.  Ill-defined densities in the right mid lung and lingula, potentially atelectasis or early pneumonia. Follow-up to clearance recommended. 2.  Mild cardiomegaly, without edema.   Original Report Authenticated By: Gaylyn Rong, M.D.    EKG:  Rhythm: normalsinus. Very poor baseline.  Vent. rate 83 BPM PR interval 184 ms QRS duration 158 ms QT/QTc 408/479 ms RBBB ST segments: NS ST changes    1. UTI (urinary tract infection)   2. HAP (hospital-acquired pneumonia)   3. Change in mental status   4. Alzheimer disease   5. Alzheimer's disease   6. BPH (benign prostatic hyperplasia)   7. CVA (cerebral infarction)   8. Dementia   9. Dysphagia       MDM  89yM with decreased mental status and fever. Most likely 2/2 UTI. Foley changes. Rocephin ordered. Most recent urine cultures reviewed, Serratia marcescens. Ceftriaxone sensitive. Son also reports increased coughing recently. CXR somewhat equivocal, but given fever and increased cough concerning enough to tx. Vancomycin and Levaquin added for additional coverage of possible HAP.          Raeford Razor, MD 02/13/13 [phone]

## 2013-02-11 NOTE — ED Notes (Signed)
Called report to floor. No answer.

## 2013-02-11 NOTE — ED Notes (Signed)
Pt recently d/c from hospital after being treated with UTI.  Son states that pt has been having unusual generalized weakness in the afternoons for past 2 days.  States today he was fine in the morning, but then would not eat at lunch and was more weak and altered than usual.

## 2013-02-11 NOTE — Progress Notes (Addendum)
ANTIBIOTIC CONSULT NOTE - INITIAL  Pharmacy Consult for Vancomycin, May adjust Abx Indication: rule out pneumonia  No Known Allergies  Patient Measurements:   TBW 82 kg  Vital Signs: Temp: 100.3 F (37.9 C) (03/28 1715) Temp src: Rectal (03/28 1715) BP: 150/95 mmHg (03/28 1646) Pulse Rate: 86 (03/28 1646) Intake/Output from previous day:   Intake/Output from this shift: Total I/O In: -  Out: 1 [Stool:1]  Labs:  Recent Labs  02/10/13 0825 02/11/13 1722  WBC 5.8 8.8  HGB 11.5* 11.2*  PLT  --  266  CREATININE 0.89 0.81   The CrCl is unknown because both a height and weight (above a minimum accepted value) are required for this calculation. No results found for this basename: VANCOTROUGH, VANCOPEAK, VANCORANDOM, GENTTROUGH, GENTPEAK, GENTRANDOM, TOBRATROUGH, TOBRAPEAK, TOBRARND, AMIKACINPEAK, AMIKACINTROU, AMIKACIN,  in the last 72 hours   Microbiology: Recent Results (from the past 720 hour(s))  CULTURE, BLOOD (ROUTINE X 2)     Status: None   Collection Time    01/27/13 11:10 AM      Result Value Range Status   Specimen Description BLOOD HAND LEFT   Final   Special Requests BOTTLES DRAWN AEROBIC ONLY 5CC   Final   Culture  Setup Time 01/27/2013 17:18   Final   Culture NO GROWTH 5 DAYS   Final   Report Status 02/02/2013 FINAL   Final  CULTURE, BLOOD (ROUTINE X 2)     Status: None   Collection Time    01/27/13 11:20 AM      Result Value Range Status   Specimen Description BLOOD HAND RIGHT   Final   Special Requests BOTTLES DRAWN AEROBIC ONLY 5CC   Final   Culture  Setup Time 01/27/2013 17:18   Final   Culture NO GROWTH 5 DAYS   Final   Report Status 02/02/2013 FINAL   Final  URINE CULTURE     Status: None   Collection Time    01/27/13 12:00 PM      Result Value Range Status   Specimen Description URINE, CATHETERIZED   Final   Special Requests NONE   Final   Culture  Setup Time 01/27/2013 12:54   Final   Colony Count >=100,000 COLONIES/ML   Final   Culture      Final   Value: Multiple bacterial morphotypes present, none predominant. Suggest appropriate recollection if clinically indicated.   Report Status 01/28/2013 FINAL   Final  MRSA PCR SCREENING     Status: None   Collection Time    01/28/13  2:17 PM      Result Value Range Status   MRSA by PCR NEGATIVE  NEGATIVE Final   Comment:            The GeneXpert MRSA Assay (FDA     approved for NASAL specimens     only), is one component of a     comprehensive MRSA colonization     surveillance program. It is not     intended to diagnose MRSA     infection nor to guide or     monitor treatment for     MRSA infections.  URINE CULTURE     Status: None   Collection Time    01/30/13  2:57 AM      Result Value Range Status   Specimen Description URINE, RANDOM   Final   Special Requests NONE   Final   Culture  Setup Time 01/30/2013 14:57   Final  Colony Count >=100,000 COLONIES/ML   Final   Culture SERRATIA MARCESCENS   Final   Report Status 02/03/2013 FINAL   Final   Organism ID, Bacteria SERRATIA MARCESCENS   Final   Medical History: Past Medical History  Diagnosis Date  . IBS (irritable bowel syndrome)   . Diverticulosis   . Cataract   . OAB (overactive bladder)   . Macular degeneration   . Alzheimer disease   . Hypertension   . Arthritis   . Osteoporosis   . Parkinsonian syndrome   . Syncope, vasovagal   . Neuromuscular disorder     parkisonian syndrome  . 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 Unspecified transient cerebral ischemia   . Unspecified urinary incontinence   . Senile dementia, uncomplicated   . Unspecified constipation   . Hypothyroidism   . Anemia, unspecified   . Depression   . Alzheimer's disease   . Gout, unspecified   . Thoracic or lumbosacral neuritis or radiculitis, unspecified   . Progressive supranuclear palsy    Medications:  Anti-infectives   Start     Dose/Rate Route Frequency Ordered Stop   02/11/13 2000   vancomycin (VANCOCIN) 750 mg in sodium chloride 0.9 % 150 mL IVPB     750 mg 150 mL/hr over 60 Minutes Intravenous Every 12 hours 02/11/13 1855     02/11/13 1845  levofloxacin (LEVAQUIN) IVPB 500 mg     500 mg 100 mL/hr over 60 Minutes Intravenous  Once 02/11/13 1840     02/11/13 1745  cefTRIAXone (ROCEPHIN) 1 g in dextrose 5 % 50 mL IVPB     1 g 100 mL/hr over 30 Minutes Intravenous  Once 02/11/13 1732       Assessment: 20 yoM recently admitted for UTI, admit for weakness, AMS. Previous urine culture grew Serratia; treated with Cipro at discharge   Vancomcyin per Pharmacy x 8 days, treat for PNA- HCAP  Cefepime and Levaquin adjusted for renal function  Goal of Therapy:  Vancomycin trough level 15-20 mcg/ml  Plan:  Vancomycin 750mg  q12 x 8 days Cefepime to 1gm q12 x 8 days Levaquin to 750mg  q48h x 2 doses Follow Cxray, cultures  Otho Bellows PharmD Pager [phone] 02/11/2013, 6:57 PM

## 2013-02-11 NOTE — ED Notes (Signed)
[id number]<AV> Expected date:<BR> Expected time:<BR> Means of arrival:<BR> Comments:<BR> Ems/ elderly possible uti

## 2013-02-11 NOTE — H&P (Signed)
[facility] History and Physical  Ricky [name] ZOX:[id number] DOB: [birth year] DOA: 02/11/2013  Referring physician: ER physician PCP: Bufford Spikes, DO   Chief Complaint: Altered mental status   HPI:  Pt is 79 yo male with complex and multiple medical problems outlined below, including Alzheimer's dementia who presents to [facility] ED with main concern of altered mental status associated with fevers. Pt is unable to provide history due to advanced dementia and therefore details obtained from available records and ED physician. Pt apparently had fever for several day and productive cough of yellow sputum noted. Unclear if chest pain was a concern at any time, but no known shortness of breath noted, no specific abdominal or urinary concerns noted.   In ED, pt with fever, T max 101F, CXR with ? PNA. TR[facility] asked to admit for treatment of presumptive PNA. Telemetry bed requested.   Principal Problem:   Fever - possibly related to PNA and given the fact that pt has just recently been hospitalized, will treat as HCAP - will admit to telemetry for now but will likely be able to transfer to regular floor in 24 hours if no events - will provide broad spectrum ABX to cover for HCAP - sputum analysis ordered as well urine legionella nd strep pneumo - supportive care and oxygen if needed Active Problems:   PNA (pneumonia) - broad spectrum ABX as noted above - follow upon sputum analysis    FTT (failure to thrive) in adult - dysphagia I diet - PT evaluation while inpatient     HYPOTHYROIDISM - continue Synthroid    ALZHEIMERS DISEASE - advanced, continue Namenda    Hypertension - stable BP on admission, continue Hydralazine  And monitor vitals per floor protocol   Review of Systems:  Unable to obtain due to altered mental status   Past Medical History  Diagnosis Date  . IBS (irritable bowel syndrome)   . Diverticulosis   . Cataract   . OAB (overactive bladder)   . Macular degeneration    . Alzheimer disease   . Hypertension   . Arthritis   . Osteoporosis   . Parkinsonian syndrome   . Syncope, vasovagal   . Neuromuscular disorder     parkisonian syndrome  . 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 Unspecified transient cerebral ischemia   . Unspecified urinary incontinence   . Senile dementia, uncomplicated   . Unspecified constipation   . Hypothyroidism   . Anemia, unspecified   . Depression   . Alzheimer's disease   . Gout, unspecified   . Thoracic or lumbosacral neuritis or radiculitis, unspecified   . Progressive supranuclear palsy    Past Surgical History  Procedure Laterality Date  . Esophagogastroduodenoscopy    . Eye surgery  2006    LEFT CATARACT   Social History:  reports that he has never smoked. He has never used smokeless tobacco. He reports that he does not drink alcohol or use illicit drugs.  No Known Allergies  Family History: no history of cancers, no cardiovascular diseases on mother or father side  Medication Sig  acetaminophen  500 MG tablet Take 500 mg by mouth 2 (two) times daily.  aspirin EC 325 MG EC tablet Take 1 tablet (325 mg total) by mouth daily.  atorvastatin (LIPITOR) 10 MG tablet Take 1 tablet (10 mg total) by mouth daily at 6 PM.  Calcium Carbonate-Vit D Chew 1 tablet 2  times daily with a meal.   Cyanocobalamin (  VITAMIN B-12 PO) Take 5,000 mcg by mouth daily.  ferrous sulfate 325 (65 FE) MG tablet Take 325 mg by mouth every other day.   hydrALAZINE  25 MG tablet Take 12.5 mg by mouth 2 (two) times daily.  levothyroxine (SYNTHROID, LEVOTHROID) 25 MCG tablet Take 25 mcg by mouth daily.  memantine (NAMENDA) 10 MG tablet Take 10 mg by mouth 2 (two) times daily.   oxyCODONE (OXY IR/ROXICODONE) 5 MG immediate release tablet Take 1 tablet (5 mg total) by mouth every 8 (eight) hours as needed for pain.   Physical Exam: Filed Vitals:   02/11/13 1646 02/11/13 1715  BP: 150/95   Pulse: 86   Temp:   100.3 F (37.9 C)  TempSrc:  Rectal  Resp: 15   SpO2: 93%     Physical Exam  Constitutional: Appears chronically ill but not in acute distress HENT: Normocephalic. External right and left ear normal. Dry MM Eyes: Conjunctivae and EOM are normal. PERRLA, no scleral icterus.  Neck: Normal ROM. Neck supple. No JVD. No tracheal deviation. No thyromegaly.  CVS: RRR, S1/S2 +, no murmurs, no gallops, no carotid bruit.  Pulmonary: Effort and breath sounds normal, no stridor, rhonchi, bibasilar rales  Abdominal: Soft. BS +,  no distension, tenderness, rebound or guarding.  Musculoskeletal: Normal range of motion. No edema and no tenderness.  Lymphadenopathy: No lymphadenopathy noted, cervical, inguinal. Neuro: Alert but oriented to name only. Normal reflexes, muscle tone coordination. No cranial nerve deficit. Skin: Skin is warm and dry. No rash noted. Not diaphoretic. No erythema. No pallor.  Psychiatric: Normal mood and affect. Behavior, judgment, thought content normal.   Labs on Admission:  Basic Metabolic Panel:  Recent Labs Lab 02/10/13 0825 02/11/13 1722  NA 141 134*  K 4.4 4.0  CL 102 99  CO2 25 26  GLUCOSE 88 100*  BUN 12 9  CREATININE 0.89 0.81  CALCIUM 9.5 9.1   Liver Function Tests:  Recent Labs Lab 02/10/13 0825  AST 21  ALT 23  ALKPHOS 89  BILITOT 0.5  PROT 7.6   CBC:  Recent Labs Lab 02/10/13 0825 02/11/13 1722  WBC 5.8 8.8  NEUTROABS 3.8 7.0  HGB 11.5* 11.2*  HCT 35.3* 34.7*  MCV 86 86.3  PLT  --  266   Radiological Exams on Admission: Dg Chest Portable 1 View 02/11/2013  1.  Ill-defined densities in the right mid lung and lingula, potentially atelectasis or early pneumonia. Follow-up to clearance recommended.  2.  Mild cardiomegaly, without edema.    EKG: Normal sinus rhythm, no ST/T wave changes  Code Status: Full Family Communication: Pt at bedside Disposition Plan: Admit for further evaluation, telemetry bed requested   Manson Passey,  MD  [facility] Pager [phone]  If 7PM-7AM, please contact night-coverage [email or web address] Password [facility] 02/11/2013, 7:11 PM

## 2013-02-11 NOTE — ED Notes (Signed)
Nurse not available to take report. Will recall report soon. Contact # left for nurse to call back. 

## 2013-02-11 NOTE — ED Notes (Signed)
MD at bedside. 

## 2013-02-12 DIAGNOSIS — F0390 Unspecified dementia without behavioral disturbance: Secondary | ICD-10-CM

## 2013-02-12 DIAGNOSIS — I6350 Cerebral infarction due to unspecified occlusion or stenosis of unspecified cerebral artery: Secondary | ICD-10-CM

## 2013-02-12 DIAGNOSIS — R1310 Dysphagia, unspecified: Secondary | ICD-10-CM

## 2013-02-12 LAB — CBC
HCT: 34.1 % — ABNORMAL LOW (ref 39.0–52.0)
Hemoglobin: 10.9 g/dL — ABNORMAL LOW (ref 13.0–17.0)
MCV: 87 fL (ref 78.0–100.0)
RBC: 3.92 MIL/uL — ABNORMAL LOW (ref 4.22–5.81)
WBC: 8.6 10*3/uL (ref 4.0–10.5)

## 2013-02-12 LAB — BASIC METABOLIC PANEL
CO2: 25 mEq/L (ref 19–32)
Chloride: 99 mEq/L (ref 96–112)
Creatinine, Ser: 0.79 mg/dL (ref 0.50–1.35)
Potassium: 3.9 mEq/L (ref 3.5–5.1)
Sodium: 134 mEq/L — ABNORMAL LOW (ref 135–145)

## 2013-02-12 LAB — MRSA PCR SCREENING: MRSA by PCR: POSITIVE — AB

## 2013-02-12 LAB — LEGIONELLA ANTIGEN, URINE: Legionella Antigen, Urine: NEGATIVE

## 2013-02-12 MED ORDER — CHLORHEXIDINE GLUCONATE CLOTH 2 % EX PADS
6.0000 | MEDICATED_PAD | Freq: Every day | CUTANEOUS | Status: DC
  Administered 2013-02-12 – 2013-02-15 (×4): 6 via TOPICAL

## 2013-02-12 MED ORDER — MUPIROCIN 2 % EX OINT
1.0000 "application " | TOPICAL_OINTMENT | Freq: Two times a day (BID) | CUTANEOUS | Status: DC
  Administered 2013-02-12 – 2013-02-15 (×6): 1 via NASAL
  Filled 2013-02-12: qty 22

## 2013-02-12 MED ORDER — ROTIGOTINE 3 MG/24HR TD PT24
1.0000 | MEDICATED_PATCH | Freq: Every day | TRANSDERMAL | Status: DC
  Administered 2013-02-12 – 2013-02-14 (×3): 1 via TRANSDERMAL

## 2013-02-12 NOTE — Care Management (Signed)
CARE MANAGEMENT NOTE 02/12/2013  Patient:  STANLEE, ROEHRIG   Account Number:  [id number]  Date Initiated:  02/12/2013  Documentation initiated by:  Bellagrace Sylvan  Subjective/Objective Assessment:   79 yo male admitted with UTI. PTA pt lived home with adult son.     Action/Plan:   Home with HH services.   Anticipated DC Date:     Anticipated DC Plan:  HOME W HOME HEALTH SERVICES         Choice offered to / List presented to:          [facility] arranged  HH-1 RN      [facility] agency  [facility]   Status of service:  In process, will continue to follow Medicare Important Message given?   (If response is "NO", the following Medicare IM given date fields will be blank) Date Medicare IM given:   Date Additional Medicare IM given:    Discharge Disposition:    Per UR Regulation:    If discussed at Long Length of Stay Meetings, dates discussed:    Comments:  02/12/13 1639 Leonie Green [phone] Cm spoke with patient concerning discharge planning. Discharge plan includes to home with [facility] services. [facility] following pt for [facility].Pt states lives home with adult son. No HHPT receommended per PT eval. No DME need stated.

## 2013-02-12 NOTE — Progress Notes (Signed)
Patient up to chair with 2 assist.

## 2013-02-12 NOTE — Progress Notes (Signed)
[facility] PROGRESS NOTE  Ricky [name] RUE:[id number] DOB: 1917/06/07 DOA: 02/11/2013 PCP: Bufford Spikes, DO  Assessment/Plan: 1. Metabolic encephalopathy: - due to recurrent UTI - continue current Abx  2. Recurrent UTIs: due to indwelling foley catheter, neurogenic bladder -Fu urine Cx -continue current abx -foley changed 3/28 - FU Dr.Nesi  3. ? Pneumonia: exam and history not suggestive fo this but at risk for aspiration - check FU CXR - de-escalate abx based on repeat CXR  4. Dementia: continue namenda  5. Dysphagia: s/p speech eval last admission - D1 diet  6. Hypothyroidism: continue synthroid  Code Status: full code Family Communication: called and d/w daughter Disposition Plan: home when better      Antibiotics:  Vanc  Cefepime  Levaquin  HPI/Subjective: Doing ok, no complaints  Objective: Filed Vitals:   02/11/13 1715 02/11/13 1929 02/11/13 2034 02/12/13 0600  BP:  150/82 178/97 143/76  Pulse:  71 78 72  Temp: 100.3 F (37.9 C) 99.5 F (37.5 C) 100.3 F (37.9 C) 98.4 F (36.9 C)  TempSrc: Rectal Oral Oral Oral  Resp:  20 18 18   Height:   6\' 1"  (1.854 m)   Weight:   77.565 kg (171 lb) 78.926 kg (174 lb)  SpO2:  96% 95% 98%    Intake/Output Summary (Last 24 hours) at 02/12/13 1148 Last data filed at 02/12/13 0900  Gross per 24 hour  Intake    390 ml  Output   1401 ml  Net  -1011 ml   Filed Weights   02/11/13 2034 02/12/13 0600  Weight: 77.565 kg (171 lb) 78.926 kg (174 lb)    Exam:   General:  Alert, awake, oriented to self, place, partly to time  Cardiovascular: S1S2/RRR  Respiratory: diminished at bases  Abdomen: soft, NT, BS present  Ext: no edema c/c   Data Reviewed: Basic Metabolic Panel:  Recent Labs Lab 02/10/13 0825 02/11/13 1722 02/12/13 0517  NA 141 134* 134*  K 4.4 4.0 3.9  CL 102 99 99  CO2 25 26 25   GLUCOSE 88 100* 99  BUN 12 9 7   CREATININE 0.89 0.81 0.79  CALCIUM 9.5 9.1 8.8  MG  --   2.2  --   PHOS  --  2.8  --    Liver Function Tests:  Recent Labs Lab 02/10/13 0825  AST 21  ALT 23  ALKPHOS 89  BILITOT 0.5  PROT 7.6   No results found for this basename: LIPASE, AMYLASE,  in the last 168 hours No results found for this basename: AMMONIA,  in the last 168 hours CBC:  Recent Labs Lab 02/10/13 0825 02/11/13 1722 02/12/13 0517  WBC 5.8 8.8 8.6  NEUTROABS 3.8 7.0  --   HGB 11.5* 11.2* 10.9*  HCT 35.3* 34.7* 34.1*  MCV 86 86.3 87.0  PLT  --  266 268   Cardiac Enzymes: No results found for this basename: CKTOTAL, CKMB, CKMBINDEX, TROPONINI,  in the last 168 hours BNP (last 3 results)  Recent Labs  07/01/12 1632  PROBNP 118.0*   CBG:  Recent Labs Lab 02/12/13 0732  GLUCAP 106*    Recent Results (from the past 240 hour(s))  MRSA PCR SCREENING     Status: Abnormal   Collection Time    02/11/13  8:41 PM      Result Value Range Status   MRSA by PCR POSITIVE (*) NEGATIVE Final   Comment:            The  GeneXpert MRSA Assay (FDA     approved for NASAL specimens     only), is one component of a     comprehensive MRSA colonization     surveillance program. It is not     intended to diagnose MRSA     infection nor to guide or     monitor treatment for     MRSA infections.     RESULT CALLED TO, READ BACK BY AND VERIFIED WITH:     C.ORO AT 0806 ON [id number] BY C.BONGEL     Studies: Dg Chest Portable 1 View  02/11/2013  *RADIOLOGY REPORT*  Clinical Data: Fever.  Altered mental status.  Urinary tract infection.  PORTABLE CHEST - 1 VIEW  Comparison: 01/27/2013  Findings: Mild cardiomegaly noted without edema.  Vague densities in the lingula and right midlung noted.  There is atherosclerotic calcification of the aortic arch.  IMPRESSION:  1.  Ill-defined densities in the right mid lung and lingula, potentially atelectasis or early pneumonia. Follow-up to clearance recommended. 2.  Mild cardiomegaly, without edema.   Original Report Authenticated By:  Gaylyn Rong, M.D.     Scheduled Meds: . acetaminophen  500 mg Oral BID  . aspirin EC  325 mg Oral Daily  . atorvastatin  10 mg Oral q1800  . ceFEPime (MAXIPIME) IV  1 g Intravenous Q12H  . Chlorhexidine Gluconate Cloth  6 each Topical Q0600  . enoxaparin (LOVENOX) injection  40 mg Subcutaneous Q24H  . hydrALAZINE  25 mg Oral TID  . lactose free nutrition  0.5 Container Oral BID BM  . levofloxacin (LEVAQUIN) IV  750 mg Intravenous Q48H  . levothyroxine  25 mcg Oral QAC breakfast  . memantine  10 mg Oral BID  . mupirocin ointment  1 application Nasal BID  . polyethylene glycol  17 g Oral Daily  . Rotigotine  1 patch Transdermal Daily  . sodium chloride  3 mL Intravenous Q12H  . vancomycin  750 mg Intravenous Q12H   Continuous Infusions:   Principal Problem:   Fever Active Problems:   HYPOTHYROIDISM   ALZHEIMERS DISEASE   PNA (pneumonia)   Hypertension   Weakness   FTT (failure to thrive) in adult    Time spent:    [facility]  [facility] Pager [phone]. If 7PM-7AM, please contact night-coverage at [email or web address], password [facility] 02/12/2013, 11:48 AM  LOS: 1 day

## 2013-02-12 NOTE — Progress Notes (Signed)
OT Cancellation Note  Patient Details Name: Ricky [name] MRN: [id number] DOB: 20-Apr-1917   Cancelled Treatment:    Reason Eval/Treat Not Completed: Other (comment) Pt requires total care at baseline.  No acute OT needs.  Signing off.  Evern Bio 02/12/2013, 8:10 AM [phone]

## 2013-02-12 NOTE — Evaluation (Signed)
Physical Therapy Evaluation Patient Details Name: Ricky [name] MRN: [id number] DOB: [birth year] Today's Date: 02/12/2013 Time: 7829-5621 PT Time Calculation (min): 21 min  PT Assessment / Plan / Recommendation Clinical Impression  Pt admitted with dx FTT, dementia and Parkinsons presents as cooperative but total assist for all mobility.  Per limited input from pt, this appears to have been preadmission level as well.  Pt is not a candidate for PT intervention at this time. Recommend any bed<>chair transfers utilize mechanical lift for saftey of pt and staff.    PT Assessment  Patent does not need any further PT services    Follow Up Recommendations  No PT follow up    Does the patient have the potential to tolerate intense rehabilitation      Barriers to Discharge        Equipment Recommendations  None recommended by PT    Recommendations for Other Services     Frequency      Precautions / Restrictions Precautions Precautions: Fall Restrictions Weight Bearing Restrictions: No   Pertinent Vitals/Pain No pain reported      Mobility  Bed Mobility Bed Mobility: Supine to Sit;Sit to Supine Supine to Sit: 1: +2 Total assist Supine to Sit: Patient Percentage: 0% Sit to Supine: 1: +2 Total assist Sit to Supine: Patient Percentage: 0% Details for Bed Mobility Assistance: pt assisted to EOB sitting utilizing pad on bed Transfers Transfers: Not assessed Ambulation/Gait Ambulation/Gait Assistance: Not tested (comment)    Exercises     PT Diagnosis:    PT Problem List:   PT Treatment Interventions:     PT Goals    Visit Information  Last PT Received On: 02/12/13 Assistance Needed: +2    Subjective Data  Subjective: Pt answering questions seemingly appropriately - states his son takes care of him and sometimes gets him up to chair.  Pt later states he does not stand and has not done so for "a while" Patient Stated Goal: unable to state   Prior Functioning   Home Living Lives With: Son Available Help at Discharge: Family Additional Comments: unable to obtain history or prior level of function from the patient and no caregiver available. Prior Function Level of Independence: Needs assistance Able to Take Stairs?: No Driving: No Communication Communication: HOH    Cognition  Cognition Overall Cognitive Status: No family/caregiver present to determine baseline cognitive functioning Arousal/Alertness: Awake/alert Orientation Level: Disoriented to;Place;Time;Situation Behavior During Session: Flat affect Cognition - Other Comments: follows one step commands intermittantly    Extremity/Trunk Assessment Right Upper Extremity Assessment RUE ROM/Strength/Tone: Deficits RUE ROM/Strength/Tone Deficits: ltd all planes with rigidity and fluctuating resistance to movement noted - ? Parkinson's vs understanding of commands Left Upper Extremity Assessment LUE ROM/Strength/Tone: Deficits LUE ROM/Strength/Tone Deficits: ltd all planes with rigidity and intermittant resistance to movement noted - ? Parkinsons vs understanding of commands Right Lower Extremity Assessment RLE ROM/Strength/Tone: Deficits;Due to impaired cognition;Unable to fully assess RLE ROM/Strength/Tone Deficits: Ltd by increased tone/resist to movement in supine but knees flex to 70 with sitting at bedside Left Lower Extremity Assessment LLE ROM/Strength/Tone: Deficits LLE ROM/Strength/Tone Deficits: as R LE Trunk Assessment Trunk Assessment: Kyphotic   Balance Balance Balance Assessed: Yes Static Sitting Balance Static Sitting - Balance Support: Bilateral upper extremity supported Static Sitting - Level of Assistance: 4: Min assist;3: Mod assist Static Sitting - Comment/# of Minutes: 5 min with pt demonstrating retropulsion but able to achieve min guard balance by self assisting holding to rail  End of  Session PT - End of Session Activity Tolerance: Patient limited by  fatigue Patient left: in bed;with call bell/phone within reach Nurse Communication: Mobility status  GP     Laportia Carley 02/12/2013, 12:22 PM

## 2013-02-12 NOTE — Progress Notes (Signed)
Patient's Son [name] in to visit.  States patient lives with him and he provides round the clock care to patient.  [name] states that his father can get up and walk with minimal assistance when he does not have an infection however, when he has an infection he tends to become very rigid.  Son states Dr. Sandria Manly neurologist has stated he does not have Parkinson's but has Supranuclear gaze palsy.    Son requests that patient not be given milk products as this produces excess phlegm.  States he does like [employer].

## 2013-02-13 ENCOUNTER — Inpatient Hospital Stay (HOSPITAL_COMMUNITY): Payer: Medicare HMO

## 2013-02-13 LAB — STREP PNEUMONIAE URINARY ANTIGEN: Strep Pneumo Urinary Antigen: NEGATIVE

## 2013-02-13 LAB — GLUCOSE, CAPILLARY: Glucose-Capillary: 94 mg/dL (ref 70–99)

## 2013-02-13 MED ORDER — BOOST / RESOURCE BREEZE PO LIQD
1.0000 | Freq: Two times a day (BID) | ORAL | Status: DC
  Administered 2013-02-13 – 2013-02-15 (×3): 1 via ORAL

## 2013-02-13 MED ORDER — ROTIGOTINE 2 MG/24HR TD PT24
1.0000 | MEDICATED_PATCH | Freq: Every day | TRANSDERMAL | Status: DC
  Administered 2013-02-14 – 2013-02-15 (×2): 1 via TRANSDERMAL

## 2013-02-13 MED ORDER — ROTIGOTINE 1 MG/24HR TD PT24
1.0000 mg/d | MEDICATED_PATCH | TRANSDERMAL | Status: DC
  Administered 2013-02-14 – 2013-02-15 (×2): 1 mg/d via TRANSDERMAL

## 2013-02-13 NOTE — Progress Notes (Signed)
INITIAL NUTRITION ASSESSMENT  DOCUMENTATION CODES Per approved criteria  -Not Applicable   INTERVENTION: - Change nutrition supplement to Resource Breeze BID for patient preference/request of son. - RD to monitor per nutrition plan of care.   NUTRITION DIAGNOSIS: Swallowing difficulty related to dementia as evidenced by Dys 1 diet.   Goal: Patient will meet >/=90% of estimated nutrition needs.  Monitor:  PO intake, diet tolerance, weight, labs  Reason for Assessment: Malnutrition screening tool, score of 2  79 y.o. male  Admitting Dx: Fever  ASSESSMENT: Patient with advanced dementia, admitted with metabolic encephalopathy secondary to UTI. He is unable to provide history due to advanced dementia. He lives with his son. Per RN, patient is eating very well, with 100% intake of Dys 1 diet. He has Boost ordered BID, but son reports that milk-based products give him excess phlegm. He does like the [employer].   Height: Ht Readings from Last 1 Encounters:  02/11/13 6\' 1"  (1.854 m)    Weight: Wt Readings from Last 1 Encounters:  02/13/13 180 lb (81.647 kg)    Ideal Body Weight: 83.6 kg  % Ideal Body Weight: 95%  Wt Readings from Last 10 Encounters:  02/13/13 180 lb (81.647 kg)  11/29/12 180 lb (81.647 kg)  10/19/12 180 lb 6.4 oz (81.829 kg)  07/01/12 172 lb (78.019 kg)  06/21/12 170 lb 3.2 oz (77.202 kg)  02/22/12 173 lb 8 oz (78.7 kg)  02/16/12 171 lb (77.565 kg)  01/27/12 173 lb (78.472 kg)  11/21/11 178 lb (80.74 kg)  11/14/11 170 lb (77.111 kg)    Usual Body Weight: 82 kg  % Usual Body Weight: 100%  BMI:  Body mass index is 23.75 kg/(m^2). Patient is normal weight.   Estimated Nutritional Needs: Kcal: 1825-1975 kcal Protein: 95-105 g Fluid: >2.4 L  Skin: Intact  Diet Order: Dysphagia 1 - thin, 100% intake  EDUCATION NEEDS: -No education needs identified at this time   Intake/Output Summary (Last 24 hours) at 02/13/13 1442 Last data  filed at 02/13/13 1100  Gross per 24 hour  Intake   1080 ml  Output   1850 ml  Net   -770 ml    Last BM: 3/28   Labs:   Recent Labs Lab 02/10/13 0825 02/11/13 1722 02/12/13 0517  NA 141 134* 134*  K 4.4 4.0 3.9  CL 102 99 99  CO2 25 26 25   BUN 12 9 7   CREATININE 0.89 0.81 0.79  CALCIUM 9.5 9.1 8.8  MG  --  2.2  --   PHOS  --  2.8  --   GLUCOSE 88 100* 99    CBG (last 3)   Recent Labs  02/12/13 0732 02/13/13 0725  GLUCAP 106* 94    Scheduled Meds: . acetaminophen  500 mg Oral BID  . aspirin EC  325 mg Oral Daily  . atorvastatin  10 mg Oral q1800  . ceFEPime (MAXIPIME) IV  1 g Intravenous Q12H  . Chlorhexidine Gluconate Cloth  6 each Topical Q0600  . enoxaparin (LOVENOX) injection  40 mg Subcutaneous Q24H  . hydrALAZINE  25 mg Oral TID  . lactose free nutrition  0.5 Container Oral BID BM  . levofloxacin (LEVAQUIN) IV  750 mg Intravenous Q48H  . levothyroxine  25 mcg Oral QAC breakfast  . memantine  10 mg Oral BID  . mupirocin ointment  1 application Nasal BID  . polyethylene glycol  17 g Oral Daily  . [START ON  02/14/2013] rotigotine  1 patch Transdermal Daily  . [START ON 02/14/2013] Rotigotine  1 mg/day Transdermal Q24H  . Rotigotine  1 patch Transdermal Daily  . sodium chloride  3 mL Intravenous Q12H    Continuous Infusions:   Past Medical History  Diagnosis Date  . IBS (irritable bowel syndrome)   . Diverticulosis   . Cataract   . OAB (overactive bladder)   . Macular degeneration   . Alzheimer disease   . Hypertension   . Arthritis   . Osteoporosis   . Parkinsonian syndrome   . Syncope, vasovagal   . Neuromuscular disorder     parkisonian syndrome  . 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 Unspecified transient cerebral ischemia   . Unspecified urinary incontinence   . Senile dementia, uncomplicated   . Unspecified constipation   . Hypothyroidism   . Anemia, unspecified   . Depression   . Alzheimer's  disease   . Gout, unspecified   . Thoracic or lumbosacral neuritis or radiculitis, unspecified   . Progressive supranuclear palsy     Past Surgical History  Procedure Laterality Date  . Esophagogastroduodenoscopy    . Eye surgery  2006    LEFT CATARACT    Linnell Fulling, RD, LDN Pager #: [phone] After-Hours Pager #: [phone]

## 2013-02-13 NOTE — Progress Notes (Signed)
[facility] PROGRESS NOTE  Ricky [name] ZOX:[id number] DOB: 1917-04-02 DOA: 02/11/2013 PCP: Bufford Spikes, DO  Assessment/Plan: 1. Metabolic encephalopathy: - due to recurrent UTI - continue CEfepime/levaquin, DC Vanc  2. Recurrent UTIs: due to indwelling foley catheter, neurogenic bladder -Fu urine Cx -continue CEfepime/levaquin -foley changed 3/28 - FU Dr.Nesi  3. ? Pneumonia: exam and history not suggestive fo this but at risk for aspiration -  FU CXR today - blood Cx NGTD - DC Vanc, continue other Abx  4. Dementia: continue namenda  5. Dysphagia: s/p speech eval last admission - D1 diet  6. Hypothyroidism: continue synthroid  Code Status: full code Family Communication: called and d/w daughter Disposition Plan: home when better      Antibiotics:  Vanc  Cefepime  Levaquin  HPI/Subjective: Doing ok, no complaints  Objective: Filed Vitals:   02/12/13 1416 02/12/13 1835 02/12/13 2200 02/13/13 0600  BP: 162/91  132/75 137/75  Pulse: 68  72 68  Temp: 98.1 F (36.7 C)  98.6 F (37 C) 98.3 F (36.8 C)  TempSrc: Axillary  Oral Oral  Resp: 18  18 18   Height:      Weight:    81.647 kg (180 lb)  SpO2: 99% 94% 96% 95%    Intake/Output Summary (Last 24 hours) at 02/13/13 0732 Last data filed at 02/13/13 0600  Gross per 24 hour  Intake   1040 ml  Output   2250 ml  Net  -1210 ml   Filed Weights   02/11/13 2034 02/12/13 0600 02/13/13 0600  Weight: 77.565 kg (171 lb) 78.926 kg (174 lb) 81.647 kg (180 lb)    Exam:   General:  Alert, awake, oriented to self, place, partly to time  Cardiovascular: S1S2/RRR  Respiratory: diminished at bases  Abdomen: soft, NT, BS present  Ext: no edema c/c   Data Reviewed: Basic Metabolic Panel:  Recent Labs Lab 02/10/13 0825 02/11/13 1722 02/12/13 0517  NA 141 134* 134*  K 4.4 4.0 3.9  CL 102 99 99  CO2 25 26 25   GLUCOSE 88 100* 99  BUN 12 9 7   CREATININE 0.89 0.81 0.79  CALCIUM 9.5 9.1  8.8  MG  --  2.2  --   PHOS  --  2.8  --    Liver Function Tests:  Recent Labs Lab 02/10/13 0825  AST 21  ALT 23  ALKPHOS 89  BILITOT 0.5  PROT 7.6   No results found for this basename: LIPASE, AMYLASE,  in the last 168 hours No results found for this basename: AMMONIA,  in the last 168 hours CBC:  Recent Labs Lab 02/10/13 0825 02/11/13 1722 02/12/13 0517  WBC 5.8 8.8 8.6  NEUTROABS 3.8 7.0  --   HGB 11.5* 11.2* 10.9*  HCT 35.3* 34.7* 34.1*  MCV 86 86.3 87.0  PLT  --  266 268   Cardiac Enzymes: No results found for this basename: CKTOTAL, CKMB, CKMBINDEX, TROPONINI,  in the last 168 hours BNP (last 3 results)  Recent Labs  07/01/12 1632  PROBNP 118.0*   CBG:  Recent Labs Lab 02/12/13 0732  GLUCAP 106*    Recent Results (from the past 240 hour(s))  CULTURE, BLOOD (ROUTINE X 2)     Status: None   Collection Time    02/11/13  5:22 PM      Result Value Range Status   Specimen Description BLOOD RIGHT HAND   Final   Special Requests BOTTLES DRAWN AEROBIC AND ANAEROBIC 3.5ML  Final   Culture  Setup Time 02/11/2013 23:32   Final   Culture     Final   Value:        BLOOD CULTURE RECEIVED NO GROWTH TO DATE CULTURE WILL BE HELD FOR 5 DAYS BEFORE ISSUING A FINAL NEGATIVE REPORT   Report Status PENDING   Incomplete  CULTURE, BLOOD (ROUTINE X 2)     Status: None   Collection Time    02/11/13  6:30 PM      Result Value Range Status   Specimen Description BLOOD RIGHT HAND   Final   Special Requests BOTTLES DRAWN AEROBIC AND ANAEROBIC   Final   Culture  Setup Time 02/11/2013 23:32   Final   Culture     Final   Value:        BLOOD CULTURE RECEIVED NO GROWTH TO DATE CULTURE WILL BE HELD FOR 5 DAYS BEFORE ISSUING A FINAL NEGATIVE REPORT   Report Status PENDING   Incomplete  MRSA PCR SCREENING     Status: Abnormal   Collection Time    02/11/13  8:41 PM      Result Value Range Status   MRSA by PCR POSITIVE (*) NEGATIVE Final   Comment:            The  GeneXpert MRSA Assay (FDA     approved for NASAL specimens     only), is one component of a     comprehensive MRSA colonization     surveillance program. It is not     intended to diagnose MRSA     infection nor to guide or     monitor treatment for     MRSA infections.     RESULT CALLED TO, READ BACK BY AND VERIFIED WITH:     C.ORO AT 0806 ON [id number] BY C.BONGEL     Studies: Dg Chest Portable 1 View  02/11/2013  *RADIOLOGY REPORT*  Clinical Data: Fever.  Altered mental status.  Urinary tract infection.  PORTABLE CHEST - 1 VIEW  Comparison: 01/27/2013  Findings: Mild cardiomegaly noted without edema.  Vague densities in the lingula and right midlung noted.  There is atherosclerotic calcification of the aortic arch.  IMPRESSION:  1.  Ill-defined densities in the right mid lung and lingula, potentially atelectasis or early pneumonia. Follow-up to clearance recommended. 2.  Mild cardiomegaly, without edema.   Original Report Authenticated By: Gaylyn Rong, M.D.     Scheduled Meds: . acetaminophen  500 mg Oral BID  . aspirin EC  325 mg Oral Daily  . atorvastatin  10 mg Oral q1800  . ceFEPime (MAXIPIME) IV  1 g Intravenous Q12H  . Chlorhexidine Gluconate Cloth  6 each Topical Q0600  . enoxaparin (LOVENOX) injection  40 mg Subcutaneous Q24H  . hydrALAZINE  25 mg Oral TID  . lactose free nutrition  0.5 Container Oral BID BM  . levofloxacin (LEVAQUIN) IV  750 mg Intravenous Q48H  . levothyroxine  25 mcg Oral QAC breakfast  . memantine  10 mg Oral BID  . mupirocin ointment  1 application Nasal BID  . polyethylene glycol  17 g Oral Daily  . Rotigotine  1 patch Transdermal Daily  . sodium chloride  3 mL Intravenous Q12H  . vancomycin  750 mg Intravenous Q12H   Continuous Infusions:   Principal Problem:   Fever Active Problems:   HYPOTHYROIDISM   ALZHEIMERS DISEASE   PNA (pneumonia)   Hypertension   Weakness   FTT (failure  to thrive) in adult    Time spent:     [facility]  [facility] Pager [phone]. If 7PM-7AM, please contact night-coverage at [email or web address], password [facility] 02/13/2013, 7:32 AM  LOS: 2 days

## 2013-02-13 NOTE — Plan of Care (Signed)
Problem: Phase I Progression Outcomes Goal: Voiding-avoid urinary catheter unless indicated Outcome: Not Applicable Date Met:  02/13/13 Has a chronic foley due to urinary retention since January 2014

## 2013-02-14 ENCOUNTER — Ambulatory Visit: Payer: Self-pay | Admitting: Internal Medicine

## 2013-02-14 LAB — GLUCOSE, CAPILLARY: Glucose-Capillary: 96 mg/dL (ref 70–99)

## 2013-02-14 LAB — URINE CULTURE: Colony Count: >=100000

## 2013-02-14 MED ORDER — SULFAMETHOXAZOLE-TRIMETHOPRIM 400-80 MG PO TABS
1.0000 | ORAL_TABLET | Freq: Two times a day (BID) | ORAL | Status: DC
  Administered 2013-02-14 – 2013-02-15 (×3): 1 via ORAL
  Filled 2013-02-14 (×4): qty 1

## 2013-02-14 MED ORDER — SULFAMETHOXAZOLE-TRIMETHOPRIM 400-80 MG PO TABS: 1.0000 | ORAL_TABLET | Freq: Two times a day (BID) | ORAL | Status: DC

## 2013-02-14 NOTE — Progress Notes (Signed)
Lab called with positive urine cultures showing greater than 100 thousand  MRSA.  Dr Jomarie Longs send a text page and informed.

## 2013-02-14 NOTE — Progress Notes (Signed)
Pt son here asking if we can d/c the his dad in the am.  The son needs some time to make arrangements. Dr Jomarie Longs called and states that is ok to d/c pt in the am.

## 2013-02-14 NOTE — Progress Notes (Signed)
Spoke with son Nataniel Gasper about [facility] services, he informed me that his dad was active with [facility] and would like to resume services with them. Intake information faxed to them.

## 2013-02-14 NOTE — Plan of Care (Signed)
Problem: Phase III Progression Outcomes Goal: Foley discontinued Outcome: Not Applicable Date Met:  02/14/13 Pt has chronic foley due to urinary retention.

## 2013-02-14 NOTE — Discharge Summary (Signed)
Physician Discharge Summary  Ricky [name]:[id number] DOB: July 22, 1917 DOA: 02/11/2013  PCP: Ricky Spikes, DO  Admit date: 02/11/2013 Discharge date: 02/14/2013  Time spent:  Recommendations for Outpatient Follow-up:  1. PCP Dr.Reed in 1 week 2. FU with Dr.Nesi in 1-2 weeks  Discharge Diagnoses:    UTI   Metabolic encephalopathy   HYPOTHYROIDISM   ALZHEIMERS DISEASE   Hypertension   Weakness   FTT (failure to thrive) in adult   Dysphagia   Recurrent UTI   Indwelling [name] catheter   Discharge Condition: stable  Diet recommendation: Dysphagia 1 diet  Filed Weights   02/12/13 0600 02/13/13 0600 02/14/13 0600  Weight: 78.926 kg (174 lb) 81.647 kg (180 lb) 80.287 kg (177 lb)    History of present illness:  Pt is 79 yo male with complex and multiple medical problems including Alzheimer's dementia, p[rogressive neurological disorder presented to [facility] ED with main concern of altered mental status associated with fevers.  In ED, pt with fever, T max 101F, UA suggestive of UTI  Hospital Course:  1. Metabolic encephalopathy: - due to MRSA UTI  - improved back to baseline with treatment of UTI  2. Recurrent UTIs: due to indwelling [name] catheter, neurogenic bladder  -- Initially treated with IV Vanc/CEfepime/levaquin to cover for possible health care associated pneumonia, then Urine culture grew MRSA, i d/w Dr.Hatcher ID and decision made to treat with PO Bactrim for 5 more days for 7 days total of abx - [name] catheter was changed on 3/28  - FU Dr.Nesi in 1-2 weeks   3. Dementia:  - continue namenda   4. Dysphagia: s/p speech eval last admission  - continued on D1 diet   6. Hypothyroidism:  - continue synthroid      Discharge Exam: Filed Vitals:   02/13/13 1331 02/13/13 2200 02/14/13 0600 02/14/13 1435  BP: 130/70 139/74 159/85 137/72  Pulse: 70 85 67 73  Temp: 98.5 F (36.9 C) 99.4 F (37.4 C) 98.8 F (37.1 C) 99.2 F (37.3 C)  TempSrc: Oral  Oral Oral Oral  Resp: 18 18 18 16   Height:      Weight:   80.287 kg (177 lb)   SpO2: 94% 96% 95% 96%    General: AAO to self, place and partly to timr Cardiovascular: S1S2/RRR Respiratory: CTAB  Discharge Instructions  Discharge Orders   Future Appointments Provider Department Dept Phone   02/24/2013 2:15 PM Carita Pian [facility] [phone]   04/01/2013 12:00 PM Ricky Foley, MD [facility] [phone]   Future Orders Complete By Expires     Diet - low sodium heart healthy  As directed     Increase activity slowly  As directed         Medication List    STOP taking these medications       ciprofloxacin 500 MG tablet  Commonly known as:  CIPRO      TAKE these medications       acetaminophen 500 MG tablet  Commonly known as:  TYLENOL  Take 500 mg by mouth 2 (two) times daily.     aspirin 325 MG EC tablet  Take 1 tablet (325 mg total) by mouth daily.     atorvastatin 10 MG tablet  Commonly known as:  LIPITOR  Take 1 tablet (10 mg total) by mouth daily at 6 PM.     bacitracin ointment  Apply topically 2 (two) times daily. Apply to penis  CALTRATE 600+D PLUS 600-800 MG-UNIT Chew  Chew 1 tablet by mouth 2 (two) times daily with a meal.     Cranberry 500 MG Chew  Chew 1 tablet by mouth daily.     ferrous sulfate 325 (65 FE) MG tablet  Take 325 mg by mouth every other day.     guaiFENesin 600 MG 12 hr tablet  Commonly known as:  MUCINEX  Take 1,200 mg by mouth 2 (two) times daily as needed for congestion.     hydrALAZINE 25 MG tablet  Commonly known as:  APRESOLINE  Take 12.5 mg by mouth 2 (two) times daily.     ICAPS AREDS FORMULA PO  Take 1 capsule by mouth 2 (two) times daily with a meal.     lactose free nutrition Liqd  Take 0.5 Containers by mouth 2 (two) times daily between meals.     levothyroxine 25 MCG tablet  Commonly known as:  SYNTHROID, LEVOTHROID  Take 25 mcg by mouth daily.     MELATONIN EXTRA  STRENGTH PO  Take 15 mLs by mouth at bedtime as needed. To help sleep     memantine 10 MG tablet  Commonly known as:  NAMENDA  Take 10 mg by mouth 2 (two) times daily.     multivitamin with minerals Tabs  Take 1 tablet by mouth daily.     oxyCODONE 5 MG immediate release tablet  Commonly known as:  Oxy IR/ROXICODONE  Take 1 tablet (5 mg total) by mouth every 8 (eight) hours as needed for pain.     polyethylene glycol packet  Commonly known as:  MIRALAX / GLYCOLAX  Take 17 g by mouth daily.     Rotigotine 3 MG/24HR Pt24  Place 1 patch onto the skin daily.     sulfamethoxazole-trimethoprim 400-80 MG per tablet  Commonly known as:  BACTRIM,SEPTRA  Take 1 tablet by mouth every 12 (twelve) hours. For 5 days     UNABLE TO FIND  DME POWER LIFT CHAIR      Diagnosis: Progressive supranuclear palsy, dementia with generalized debility, total assist assistance     VITAMIN B-12 PO  Take 5,000 mcg by mouth daily.           Follow-up Information   Follow up with Ricky, TIFFANY, DO In 1 week.   Contact information:   [address] [place] [address] [phone]       Follow up with Urologist  In 2 weeks.       The results of significant diagnostics from this hospitalization (including imaging, microbiology, ancillary and laboratory) are listed below for reference.    Significant Diagnostic Studies: Dg Chest 1 View  01/27/2013  *RADIOLOGY REPORT*  Clinical Data: Altered mental status.  CHEST - 1 VIEW  Comparison: 09/07/2012.  Findings: Trachea is midline.  Heart is enlarged, stable.  Thoracic aorta is calcified.  Mild chronic changes are seen in the lungs without air space consolidation or pleural fluid. Probable nodular summation shadow in the right suprahilar region.  IMPRESSION:  1.  No acute findings. 2.  Probable nodular summation shadow in the right suprahilar region.  Continued attention on follow-up exams is warranted.   Original Report Authenticated By: Leanna Battles, M.D.    Dg Chest 2 View  02/13/2013  *RADIOLOGY REPORT*  Clinical Data: Weakness and shortness of breath.  CHEST - 2 VIEW  Comparison: 02/11/2013.  Findings: Stable enlarged cardiac silhouette.  The patient's chin is obscuring the medial lung apices.  The remainder of the lungs are clear.  Diffuse osteopenia.  IMPRESSION: No acute abnormality.   Original Report Authenticated By: Beckie Salts, M.D.    Ct Head Wo Contrast  02/02/2013  *RADIOLOGY REPORT*  Clinical Data: Change in mental status.  Question stroke.  CT HEAD WITHOUT CONTRAST  Technique:  Contiguous axial images were obtained from the base of the skull through the vertex without contrast.  Comparison: MRI brain 06/21/2012.  CT head without contrast 06/21/2012.  Findings: Kyphosis limits patient positioning, as before.  Atrophy and extensive white matter disease is similar to the prior study. No acute cortical infarct, hemorrhage, mass lesion is present.  The ventricles are stable in size.  Bilateral sphenoid sinus disease is present.  The paranasal sinuses and mastoid air cells are otherwise clear.  IMPRESSION:  1.  No acute intracranial abnormality. 2.  Stable atrophy diffuse white matter disease. 3.  Sphenoid sinus disease.   Original Report Authenticated By: Marin Roberts, M.D.    Dg Chest Portable 1 View  02/11/2013  *RADIOLOGY REPORT*  Clinical Data: Fever.  Altered mental status.  Urinary tract infection.  PORTABLE CHEST - 1 VIEW  Comparison: 01/27/2013  Findings: Mild cardiomegaly noted without edema.  Vague densities in the lingula and right midlung noted.  There is atherosclerotic calcification of the aortic arch.  IMPRESSION:  1.  Ill-defined densities in the right mid lung and lingula, potentially atelectasis or early pneumonia. Follow-up to clearance recommended. 2.  Mild cardiomegaly, without edema.   Original Report Authenticated By: Gaylyn Rong, M.D.     Microbiology: Recent Results (from the past 240 hour(s))   URINE CULTURE     Status: None   Collection Time    02/11/13  4:52 PM      Result Value Range Status   Specimen Description URINE, CLEAN CATCH   Final   Special Requests NONE   Final   Culture  Setup Time 02/12/2013 01:08   Final   Colony Count >=100,000 COLONIES/ML   Final   Culture     Final   Value: METHICILLIN RESISTANT STAPHYLOCOCCUS AUREUS     Note: RIFAMPIN AND GENTAMICIN SHOULD NOT BE USED AS SINGLE DRUGS FOR TREATMENT OF STAPH INFECTIONS. CRITICAL RESULT CALLED TO, READ BACK BY AND VERIFIED WITH: TRINA S@1024  ON [id number] BY [facility]   Report Status 02/14/2013 FINAL   Final   Organism ID, Bacteria METHICILLIN RESISTANT STAPHYLOCOCCUS AUREUS   Final  CULTURE, BLOOD (ROUTINE X 2)     Status: None   Collection Time    02/11/13  5:22 PM      Result Value Range Status   Specimen Description BLOOD RIGHT HAND   Final   Special Requests BOTTLES DRAWN AEROBIC AND ANAEROBIC 3.5ML   Final   Culture  Setup Time 02/11/2013 23:32   Final   Culture     Final   Value:        BLOOD CULTURE RECEIVED NO GROWTH TO DATE CULTURE WILL BE HELD FOR 5 DAYS BEFORE ISSUING A FINAL NEGATIVE REPORT   Report Status PENDING   Incomplete  CULTURE, BLOOD (ROUTINE X 2)     Status: None   Collection Time    02/11/13  6:30 PM      Result Value Range Status   Specimen Description BLOOD RIGHT HAND   Final   Special Requests BOTTLES DRAWN AEROBIC AND ANAEROBIC   Final   Culture  Setup Time 02/11/2013 23:32   Final   Culture  Final   Value:        BLOOD CULTURE RECEIVED NO GROWTH TO DATE CULTURE WILL BE HELD FOR 5 DAYS BEFORE ISSUING A FINAL NEGATIVE REPORT   Report Status PENDING   Incomplete  MRSA PCR SCREENING     Status: Abnormal   Collection Time    02/11/13  8:41 PM      Result Value Range Status   MRSA by PCR POSITIVE (*) NEGATIVE Final   Comment:            The GeneXpert MRSA Assay (FDA     approved for NASAL specimens     only), is one component of a     comprehensive MRSA colonization      surveillance program. It is not     intended to diagnose MRSA     infection nor to guide or     monitor treatment for     MRSA infections.     RESULT CALLED TO, READ BACK BY AND VERIFIED WITH:     C.ORO AT 0806 ON [id number] BY C.BONGEL     Labs: Basic Metabolic Panel:  Recent Labs Lab 02/10/13 0825 02/11/13 1722 02/12/13 0517  NA 141 134* 134*  K 4.4 4.0 3.9  CL 102 99 99  CO2 25 26 25   GLUCOSE 88 100* 99  BUN 12 9 7   CREATININE 0.89 0.81 0.79  CALCIUM 9.5 9.1 8.8  MG  --  2.2  --   PHOS  --  2.8  --    Liver Function Tests:  Recent Labs Lab 02/10/13 0825  AST 21  ALT 23  ALKPHOS 89  BILITOT 0.5  PROT 7.6   No results found for this basename: LIPASE, AMYLASE,  in the last 168 hours No results found for this basename: AMMONIA,  in the last 168 hours CBC:  Recent Labs Lab 02/10/13 0825 02/11/13 1722 02/12/13 0517  WBC 5.8 8.8 8.6  NEUTROABS 3.8 7.0  --   HGB 11.5* 11.2* 10.9*  HCT 35.3* 34.7* 34.1*  MCV 86 86.3 87.0  PLT  --  266 268   Cardiac Enzymes: No results found for this basename: CKTOTAL, CKMB, CKMBINDEX, TROPONINI,  in the last 168 hours BNP: BNP (last 3 results)  Recent Labs  07/01/12 1632  PROBNP 118.0*   CBG:  Recent Labs Lab 02/12/13 0732 02/13/13 0725 02/14/13 0724  GLUCAP 106* 94 96       Signed:  Casi Westerfeld  [facility] 02/14/2013, 5:38 PM

## 2013-02-15 LAB — GLUCOSE, CAPILLARY: Glucose-Capillary: 98 mg/dL (ref 70–99)

## 2013-02-15 NOTE — Progress Notes (Signed)
Home meds given to pt son. Paper signed and placed in chart and other paper returned to pharm.

## 2013-02-15 NOTE — Progress Notes (Signed)
Pt son here voices understanding of d/c instructions.  No changes noted since am assessment.

## 2013-02-17 LAB — CULTURE, BLOOD (ROUTINE X 2)
Culture: NO GROWTH
Culture: NO GROWTH

## 2013-02-24 ENCOUNTER — Ambulatory Visit: Payer: Medicare Other | Admitting: Internal Medicine

## 2013-02-24 ENCOUNTER — Encounter: Payer: Self-pay | Admitting: Geriatric Medicine

## 2013-02-24 ENCOUNTER — Telehealth: Payer: Self-pay | Admitting: Internal Medicine

## 2013-02-24 NOTE — Telephone Encounter (Signed)
Left VM on son's answering machine.  BP in 140 to 160 range I would recomm keeping patient on same regimen.  Do not increase.

## 2013-02-24 NOTE — Telephone Encounter (Signed)
Dr Tenny Craw spoke with son and advised to keep on current dose.

## 2013-03-04 ENCOUNTER — Encounter: Payer: Self-pay | Admitting: *Deleted

## 2013-03-07 ENCOUNTER — Ambulatory Visit (INDEPENDENT_AMBULATORY_CARE_PROVIDER_SITE_OTHER): Payer: Medicare HMO | Admitting: Internal Medicine

## 2013-03-07 ENCOUNTER — Encounter: Payer: Self-pay | Admitting: Internal Medicine

## 2013-03-07 VITALS — BP 150/92 | HR 67 | Temp 98.0°F | Resp 14 | Ht 69.0 in | Wt 177.0 lb

## 2013-03-07 DIAGNOSIS — R339 Retention of urine, unspecified: Secondary | ICD-10-CM

## 2013-03-07 DIAGNOSIS — G231 Progressive supranuclear ophthalmoplegia [Steele-Richardson-Olszewski]: Secondary | ICD-10-CM

## 2013-03-07 DIAGNOSIS — R1310 Dysphagia, unspecified: Secondary | ICD-10-CM

## 2013-03-07 DIAGNOSIS — N390 Urinary tract infection, site not specified: Secondary | ICD-10-CM

## 2013-03-07 NOTE — Progress Notes (Signed)
Patient ID: Ricky [name], male   DOB: 10-23-1917, 79 y.o.   MRN: [id number] Code Status: full code--will discuss at a future visit   No Known Allergies  Chief Complaint  Patient presents with  . Hospitalization Follow-up    01/27/2013 UTI    HPI: Patient is a 79 y.o. AA male seen in the office today for the first time by me--accompanied by his son. Vasovagal syncope a couple of years ago.   Continued fainting over past few years--had about 15 events since--11 admissions.  BP was dropping when he'd pass out b/w 7-9am except once.  Always happened when sitting.  Has been case for most of past 4 years.  Thought had some dementia.  Wondered if had movement disorder--parkinsonian in nature.  Had some tremors during fainting episodes, but never otherwise.  Never treated as full on Alzheimer's.  Donepezil thought to maybe cause it.  06/20/13, leaning to one side--thought to be TIA.  No residual effect.  Then admitted 3/14 and said to have had stroke.  Most recently, Dr. Sandria Manly about to retire--seen 1/17, tried carbidopa-levodopa but nothing happened.  Never comfortable with PD-related diagnosis.  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  Has good and bad days.  Past year, has additional complication.  Infection makes the PSP get much worse--becomes rigid immobile zombie--eyes don't move.  Was in [facility] 4/2-8 2013 then in [facility]--when went to Dr. Imagene Gurney office, was like ragdoll.  Did not assist at all with transfers.  2 days later, his son, how you doing when treated for infection.  Most recent hospitalization--doing great wed, then on Friday taken to dinner, ok but not great, didn't eat, head in plate, tried to walk back to chair--walked 10 feet and had to be dragged rest of way--was UTI again.    Progression seems gradual per son.  5 different phases over a year.    For most of last year into early sept 2013 at facility or at home with son--son had to put urinal under him to  urinate--nocturia with q to one hour episodes.  Affected both of their sleep cycles.  Tried condom catheter.  Dramatic improvement into end of year.  Christmas time--hematuria and went to urgent care--ua c+s done and enlarged prostate IDd.  Had appt with Dr. Julien Girt the following week--both clean for infection but hematuria present--foley placed.  Then fainted before could f/u.  Was having urinary retention so catheter maintained since.  Sees Dr. Julien Girt this coming thurs for change in catheter.    Was on coumadin for a couple of years due to PE--off now for over 1.5years.  Was unsure if chronic reason for this.  Finally stopped in ED at one point.  NO clear cardiac problems.  10/2010 no events during time he had heart monitor on.  Takes asa 325mg  only.  Put on second heart monitor in 12/13-1/14 was on another monitor--Dr. Gunnar Fusi Ross--fainted when it was on--no findings on monitor.  Suggests more neurological.  Has had EEGs.    No active concerns right now.  Used to be able to leave him at home until 4/13--could answer phone and could walk to bathroom on own.  Used to be able to use urinal in middle of night.  Bad experience in [facility]--was very confused off and on.  Got pressure ulcers on heels and toes.  Addressed quickly when home.  Left heel wound as big as golf ball--affected recovery.  Was on accupuncture treatments for urinary incontinence--seemed to  help but infections where affecting cognition so that threw it all off.    Son does most of hands-on care.  Daughter does Scientist, water quality.  Son preps meals.  Decided they would not take him to a facility.  Has made progress for most part.  Discussed hydration for fluids--water and cranberry juice.    Swallowing at times is problematic--periods of aspiration and other times does ok.  No acid reflux treatment response.    Did PT at [facility] in 1/13 and did not do well with it.  Turned out he had a UTI.    Review of  Systems:  Review of Systems  HENT: Negative for congestion.   Eyes: Negative for blurred vision.  Respiratory: Negative for cough and shortness of breath.   Cardiovascular: Negative for chest pain.  Gastrointestinal: Negative for abdominal pain.  Genitourinary: Negative for flank pain.  Musculoskeletal: Positive for joint pain. Negative for falls.  Neurological: Positive for dizziness and weakness. Negative for headaches.       Stiffness  Endo/Heme/Allergies: Does not bruise/bleed easily.  Psychiatric/Behavioral: Positive for depression and memory loss.     Past Medical History  Diagnosis Date  . IBS (irritable bowel syndrome)   . Diverticulosis   . Cataract   . OAB (overactive bladder)   . Macular degeneration   . Alzheimer disease   . Hypertension   . Arthritis   . Osteoporosis   . Parkinsonian syndrome   . Syncope, vasovagal   . Neuromuscular disorder     parkisonian syndrome  . 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 Unspecified transient cerebral ischemia   . Unspecified urinary incontinence   . Senile dementia, uncomplicated   . Unspecified constipation   . Hypothyroidism   . Anemia, unspecified   . Depression   . Alzheimer's disease   . Gout, unspecified   . Thoracic or lumbosacral neuritis or radiculitis, unspecified   . Progressive supranuclear palsy   . Unspecified transient cerebral ischemia   . Unspecified urinary incontinence   . Syncope and collapse   . Paralysis agitans   . Unspecified transient cerebral ischemia   . Pneumonitis due to inhalation of food or vomitus   . Cellulitis and abscess of oral soft tissues   . Screening for lipoid disorders   . Senile dementia, uncomplicated   . Unspecified constipation   . Disorder of bone and cartilage, unspecified   . Other specified acquired hypothyroidism   . Unspecified hypothyroidism   . Anemia, unspecified   . Depressive disorder, not elsewhere classified   .  Alzheimer's disease   . Paralysis agitans   . Pneumonia, organism unspecified   . Pseudomonas infection in conditions classified elsewhere and of unspecified site   . Unspecified essential hypertension   . Urinary tract infection, site not specified   . Osteoarthrosis, unspecified whether generalized or localized, unspecified site   . Osteoporosis, unspecified   . Gout, unspecified   . Thoracic or lumbosacral neuritis or radiculitis, unspecified    Past Surgical History  Procedure Laterality Date  . Esophagogastroduodenoscopy    . Eye surgery  2006    LEFT CATARACT   Social History:   reports that he has never smoked. He has never used smokeless tobacco. He reports that he does not drink alcohol or use illicit drugs.  Family History  Problem Relation Age of Onset  . Cancer Mother   . Stroke Father   . Liver disease Father   .  Kidney disease Brother   . Liver disease Brother   . Emphysema Brother     Medications: Patient's Medications  New Prescriptions   No medications on file  Previous Medications   ACETAMINOPHEN (TYLENOL) 500 MG TABLET    Take 500 mg by mouth 2 (two) times daily.   ASPIRIN EC 325 MG EC TABLET    Take 1 tablet (325 mg total) by mouth daily.   ATORVASTATIN (LIPITOR) 10 MG TABLET    Take 1 tablet (10 mg total) by mouth daily at 6 PM.   BACITRACIN OINTMENT    Apply topically 2 (two) times daily. Apply to penis   CALCIUM CARBONATE-VIT D-MIN (CALTRATE 600+D PLUS) 600-800 MG-UNIT CHEW    Chew 1 tablet by mouth 2 (two) times daily with a meal.    CRANBERRY 500 MG CHEW    Chew 1 tablet by mouth daily. Take 4200 mg daily.   CYANOCOBALAMIN (VITAMIN B-12 PO)    Take 5,000 mcg by mouth daily.   FERROUS SULFATE 325 (65 FE) MG TABLET    Take 325 mg by mouth every other day.    GUAIFENESIN (MUCINEX) 600 MG 12 HR TABLET    Take 1,200 mg by mouth 2 (two) times daily as needed for congestion.   HYDRALAZINE (APRESOLINE) 25 MG TABLET    Take 12.5 mg by mouth 2 (two) times  daily.   LACTOSE FREE NUTRITION (BOOST) LIQD    Take 0.5 Containers by mouth 2 (two) times daily between meals.    LEVOTHYROXINE (SYNTHROID, LEVOTHROID) 25 MCG TABLET    Take 25 mcg by mouth daily.   MELATONIN EXTRA STRENGTH PO    Take 15 mLs by mouth at bedtime as needed. To help sleep   MEMANTINE (NAMENDA) 10 MG TABLET    Take 10 mg by mouth 2 (two) times daily.    MULTIPLE VITAMIN (MULTIVITAMIN WITH MINERALS) TABS    Take 1 tablet by mouth daily.   MULTIPLE VITAMINS-MINERALS (ICAPS AREDS FORMULA PO)    Take 1 capsule by mouth 2 (two) times daily with a meal.    POLYETHYLENE GLYCOL (MIRALAX / GLYCOLAX) PACKET    Take 17 g by mouth daily.   ROTIGOTINE 3 MG/24HR PT24    Place 1 patch onto the skin daily.   UNABLE TO FIND    DME POWER LIFT CHAIR   Diagnosis: Progressive supranuclear palsy, dementia with generalized debility, total assist assistance  Modified Medications   No medications on file  Discontinued Medications   OXYCODONE (OXY IR/ROXICODONE) 5 MG IMMEDIATE RELEASE TABLET    Take 1 tablet (5 mg total) by mouth every 8 (eight) hours as needed for pain.   SULFAMETHOXAZOLE-TRIMETHOPRIM (BACTRIM,SEPTRA) 400-80 MG PER TABLET    Take 1 tablet by mouth every 12 (twelve) hours. For 5 days     Physical Exam: Filed Vitals:   03/07/13 1603  BP: 150/92  Pulse: 67  Temp: 98 F (36.7 C)  TempSrc: Oral  Resp: 14  Height: 5\' 9"  (1.753 m)  Weight: 177 lb (80.287 kg)  SpO2: 96%  Physical Exam  Constitutional: He appears well-developed and well-nourished. No distress.  HENT:  Head: Normocephalic and atraumatic.  Right Ear: External ear normal.  Left Ear: External ear normal.  Nose: Nose normal.  Mouth/Throat: Oropharynx is clear and moist.  Eyes: Pupils are equal, round, and reactive to light.  ophthalmoplegia  Neck: No thyromegaly present.  Cardiovascular: Normal rate, regular rhythm, normal heart sounds and intact distal pulses.   Pulmonary/Chest:  Effort normal and breath sounds  normal. No respiratory distress.  Abdominal: Soft. Bowel sounds are normal.  Has chronic catheter  Musculoskeletal: He exhibits no edema and no tenderness.  Severe kyphosis;  Stiffness of legs moreso than arms, leadpipe rigidity present  Neurological: He is alert.  Oriented to person and place, not time;  Ophthalmoplegia present  Skin: Skin is warm and dry. No rash noted.      Labs reviewed: Basic Metabolic Panel:  Recent Labs  16/10/96 0335  01/27/13 1542  02/10/13 0825 02/11/13 1722 02/12/13 0517  NA 141  < >  --   < > 141 134* 134*  K 3.7  < >  --   < > 4.4 4.0 3.9  CL 105  < >  --   < > 102 99 99  CO2 23  < >  --   < > 25 26 25   GLUCOSE 90  < >  --   < > 88 100* 99  BUN 14  < >  --   < > 12 9 7   CREATININE 0.87  < > 0.85  < > 0.89 0.81 0.79  CALCIUM 9.3  < >  --   < > 9.5 9.1 8.8  MG  --   --  2.0  --   --  2.2  --   PHOS  --   --   --   --   --  2.8  --   TSH 2.271  --  0.748  --   --   --   --   < > = values in this interval not displayed. Liver Function Tests:  Recent Labs  11/24/12 0913 01/27/13 1055 01/28/13 0520 02/10/13 0825  AST 19 19 15 21   ALT 20 15 11 23   ALKPHOS 87 93 76 89  BILITOT 0.4 0.5 0.5 0.5  PROT 7.8 8.6* 7.0 7.6  ALBUMIN 3.5 3.6 2.9*  --   CBC:  Recent Labs  01/28/13 0520 02/10/13 0825 02/11/13 1722 02/12/13 0517  WBC 6.0 5.8 8.8 8.6  NEUTROABS  --  3.8 7.0  --   HGB 11.1* 11.5* 11.2* 10.9*  HCT 34.8* 35.3* 34.7* 34.1*  MCV 86.8 86 86.3 87.0  PLT 190  --  266 268   Lipid Panel:  Recent Labs  06/21/12 0313  CHOL 176  HDL 63  LDLCALC 102*  TRIG 56  CHOLHDL 2.8    Past Procedures: Hospital records reviewed  Assessment/Plan 1. Progressive supranuclear ophthalmoplegia or palsy - unfortunate parkinson's syndrome -has frequent infections with UTIs and pneumonia requiring hosptialization due to severe delirium - Ambulatory referral to Home Health to assist family with CNA care and for PT, OT  2. Urinary retention  with incomplete bladder emptying - Ambulatory referral to Home Health -has chronic catheter predisposing him to infections  3. UTI (urinary tract infection) -completed treatment --encouraged hydration with water and cranberry juice or cranberry tablets to help prevent infection  4. Dysphagia, unspecified(787.20) -due to his PSP -discussed aspiration precautions-keep HOB >30 degrees, small bites, and close monitoring for increased difficulty--would get swallowing study if progression noted -home health ST to assist with this also Labs/tests ordered:  Home health services

## 2013-03-16 ENCOUNTER — Telehealth: Payer: Self-pay | Admitting: *Deleted

## 2013-03-16 NOTE — Telephone Encounter (Signed)
Ricky [name] (son) called and stated that we have not done his father referral to [facility]. I called [facility] and they stated that the have no staffing right now and cannot take on any more cases.   I called  Reginald back and LMOVM to call back   I faxed the Hearing clinic as well

## 2013-03-17 NOTE — Telephone Encounter (Signed)
Spoke with son and he is aware that [facility] do not have the staff to take on knew cases. I also told him that I faxed paperwork for the hearing evaluation

## 2013-04-01 ENCOUNTER — Encounter: Payer: Self-pay | Admitting: Neurology

## 2013-04-01 ENCOUNTER — Ambulatory Visit (INDEPENDENT_AMBULATORY_CARE_PROVIDER_SITE_OTHER): Payer: Medicare HMO | Admitting: Neurology

## 2013-04-01 VITALS — BP 143/74 | HR 65 | Temp 98.1°F | Ht 68.0 in

## 2013-04-01 DIAGNOSIS — N390 Urinary tract infection, site not specified: Secondary | ICD-10-CM

## 2013-04-01 DIAGNOSIS — Z9889 Other specified postprocedural states: Secondary | ICD-10-CM

## 2013-04-01 DIAGNOSIS — G231 Progressive supranuclear ophthalmoplegia [Steele-Richardson-Olszewski]: Secondary | ICD-10-CM

## 2013-04-01 DIAGNOSIS — R269 Unspecified abnormalities of gait and mobility: Secondary | ICD-10-CM

## 2013-04-01 DIAGNOSIS — Z9181 History of falling: Secondary | ICD-10-CM

## 2013-04-01 DIAGNOSIS — G238 Other specified degenerative diseases of basal ganglia: Secondary | ICD-10-CM

## 2013-04-01 DIAGNOSIS — R413 Other amnesia: Secondary | ICD-10-CM

## 2013-04-01 DIAGNOSIS — R296 Repeated falls: Secondary | ICD-10-CM

## 2013-04-01 NOTE — Patient Instructions (Addendum)
  Remember to drink plenty of fluid, eat healthy meals and do not skip any meals. Try to eat protein with a every meal and eat a healthy snack such as fruit or nuts in between meals. Try to keep a regular sleep-wake schedule and try to exercise daily, particularly in the form of walking, 20-30 minutes a day, if you can.   Engage in social activities in your community and with your family and try to keep up with current events by reading the newspaper or watching the news.   As far as your medications are concerned, I would like to suggest no changes.    As far as diagnostic testing: no new test.   I would like to see you back in 3 months, sooner if we need to. Please call us with any interim questions, concerns, problems, updates or refill requests.  Brett Canales is my clinical assistant and will answer any of your questions and relay your messages to me and also relay most of my messages to you.  Our phone number is [phone]. We also have an after hours call service for urgent matters and there is a physician on-call for urgent questions. For any emergencies you know to call 911 or go to the nearest emergency room.

## 2013-04-01 NOTE — Progress Notes (Signed)
Subjective:    Patient ID: Ricky [name] is a 79 y.o. male.  HPI  Interim history:  Ricky [name] is a very pleasant 79 y.o.-year-old Right-handed male with an underlying history of pneumonia, pulmonary embolism in 2010, pneumonia, UTI and fainting spells, who presents for follow-up consultation of His memory loss. He is a former patient of Ricky [name] and has been followed by him for years. This is our first visit together and the patient is accompanied by his daughter Ricky [name] today. His last visit with Ricky [name] was on 12/03/12 and at that visit, Ricky [name] suggested the Dx PSP.    I reviewed Ricky [name] previous notes and the patient's previous records in detail and below is a summary of my Chart Review:   79 year old right-handed African American who lives at home with his son. He has a history of "blackout spells" for at least 4 years and memory loss. The first occurred 01/2009 when he was eating in his kitchen. His son found him rigid on the  floor with his head flexed, not touching the floor. His right hand was trembling. He was speaking hesitantly. He was admitted 3/2-01/19/2009 with a diagnosis of vasovagal syncope. Chest x-ray showed cardiomegaly. CTH without contrast showed chronic small vessel disease. MRI of  the brain without contrast showed no acute abnormality and chronic small vessel disease. MRA was normal. 2-D echo showed an ejection fraction of 60%. He was admitted 03/14/2009 with a cavitary lesion the left lower lobe and had a hospital course complicated by pulmonary embolus. EKG showed sinus bradycardia. MRI of the brain without contrast 12/17/2009 compared  with the  previous study 01/17/2009 showed no acute abnormality. He had generalized atrophy. MRA showed mild stenosis of the left middle cerebral artery. He has developed shuffling gait, walking hesitancy and difficulty with balance. He became more bradykinetic. His head began to flex. He was placed on Aricept for memory loss, but this was  discontinued d/t bradycardia. He has been evaluated for pacemaker by Dr. Tenny Craw who felt he is not a candidate. He has had recurrent falls. On 10/06/11= MMSE 12/30. CDT 1/4. AFT 3. He was given Sinemet for the possibility of an akinetic rigid parkinsonism but was tapered off. In 12/12 he developed pneumonia with increased confusion and was admitted to the hospital. He was admitted to the hospital on 02/17/12 and transfered to [facility].  CT scan showed no change, with atrophy, small vessel disease and enlarged ventricles. 8/13 he was admitted with increased confusion and left-sided weakness from right brain stroke; in 1/14 he had an episode of syncope but nothing was seen on a 30 day heart monitor. On 12/03/12 = MMMSE 13/30, CDT 3.   He has recently been hospitalized twice for recurrent UTIs and now has a Foley cath in place. He was re-started on HHPT and OT and ST for the past 2 weeks. She is wondering, whether he can do outpt PT after [facility] therapy finishes. He has had pressure ulcers on his feet. He has a 2 wheeled walker. There is a CNA who comes daily for about an hour to bathe and dress him and then again in the evening. Somebody is with him 24/7. He has no new complaints today, is situated in a clinic WC. They tried to rotigotine patch but he became gradually worse and this was particularly true for his mental functioning. When he was taken off of the patch while in the hospital he actually perked up a little bit.  His Past Medical History Is Significant For: Past Medical History  Diagnosis Date  . IBS (irritable bowel syndrome)   . Diverticulosis   . Cataract   . OAB (overactive bladder)   . Macular degeneration   . Alzheimer disease   . Hypertension   . Arthritis   . Osteoporosis   . Parkinsonian syndrome   . Syncope, vasovagal   . Neuromuscular disorder     parkisonian syndrome  . 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 Unspecified transient cerebral ischemia    . Unspecified urinary incontinence   . Senile dementia, uncomplicated   . Unspecified constipation   . Hypothyroidism   . Anemia, unspecified   . Depression   . Alzheimer's disease   . Gout, unspecified   . Thoracic or lumbosacral neuritis or radiculitis, unspecified   . Progressive supranuclear palsy   . Unspecified transient cerebral ischemia   . Unspecified urinary incontinence   . Syncope and collapse   . Paralysis agitans   . Unspecified transient cerebral ischemia   . Pneumonitis due to inhalation of food or vomitus   . Cellulitis and abscess of oral soft tissues   . Screening for lipoid disorders   . Senile dementia, uncomplicated   . Unspecified constipation   . Disorder of bone and cartilage, unspecified   . Other specified acquired hypothyroidism   . Unspecified hypothyroidism   . Anemia, unspecified   . Depressive disorder, not elsewhere classified   . Alzheimer's disease   . Paralysis agitans   . Pneumonia, organism unspecified   . Pseudomonas infection in conditions classified elsewhere and of unspecified site   . Unspecified essential hypertension   . Urinary tract infection, site not specified   . Osteoarthrosis, unspecified whether generalized or localized, unspecified site   . Osteoporosis, unspecified   . Gout, unspecified   . Thoracic or lumbosacral neuritis or radiculitis, unspecified     His Past Surgical History Is Significant For: Past Surgical History  Procedure Laterality Date  . Esophagogastroduodenoscopy    . Eye surgery  2006    LEFT CATARACT    His Family History Is Significant For: Family History  Problem Relation Age of Onset  . Cancer Mother   . Stroke Father   . Liver disease Father   . Kidney disease Brother   . Liver disease Brother   . Emphysema Brother     His Social History Is Significant For: History   Social History  . Marital Status: Single    Spouse Name: N/A    Number of Children: N/A  . Years of Education:  N/A   Social History Main Topics  . Smoking status: Never Smoker   . Smokeless tobacco: Never Used  . Alcohol Use: No  . Drug Use: No  . Sexually Active: No   Other Topics Concern  . None   Social History Narrative  . None    His Allergies Are:  No Known Allergies:   His Current Medications Are:  Outpatient Encounter Prescriptions as of 04/01/2013  Medication Sig Dispense Refill  . acetaminophen (TYLENOL) 500 MG tablet Take 500 mg by mouth 2 (two) times daily.      Marland Kitchen aspirin EC 325 MG EC tablet Take 1 tablet (325 mg total) by mouth daily.  30 tablet  3  . atorvastatin (LIPITOR) 10 MG tablet Take 1 tablet (10 mg total) by mouth daily at 6 PM.  30 tablet  0  . bacitracin ointment  Apply topically 2 (two) times daily. Apply to penis  120 g  3  . Calcium Carbonate-Vit D-Min (CALTRATE 600+D PLUS) 600-800 MG-UNIT CHEW Chew 1 tablet by mouth 2 (two) times daily with a meal.       . Cranberry 500 MG CHEW Chew 4,200 mg by mouth daily. Take 4200 mg daily.      . Cyanocobalamin (VITAMIN B-12 PO) Take 5,000 mcg by mouth daily.      . ferrous sulfate 325 (65 FE) MG tablet Take 325 mg by mouth every other day.       Marland Kitchen guaiFENesin (MUCINEX) 600 MG 12 hr tablet Take 1,200 mg by mouth 2 (two) times daily as needed for congestion.      . hydrALAZINE (APRESOLINE) 25 MG tablet Take 12.5 mg by mouth 2 (two) times daily.      Marland Kitchen lactose free nutrition (BOOST) LIQD Take 0.5 Containers by mouth 2 (two) times daily between meals.       Marland Kitchen MELATONIN EXTRA STRENGTH PO Take 15 mLs by mouth at bedtime as needed. To help sleep      . memantine (NAMENDA) 10 MG tablet Take 10 mg by mouth 2 (two) times daily.       . Multiple Vitamin (MULTIVITAMIN WITH MINERALS) TABS Take 1 tablet by mouth daily.      . polyethylene glycol (MIRALAX / GLYCOLAX) packet Take 17 g by mouth daily.      Marland Kitchen levothyroxine (SYNTHROID, LEVOTHROID) 25 MCG tablet Take 25 mcg by mouth daily.      . Rotigotine 3 MG/24HR PT24 Place 1 patch onto  the skin daily.      Marland Kitchen UNABLE TO FIND DME POWER LIFT CHAIR   Diagnosis: Progressive supranuclear palsy, dementia with generalized debility, total assist assistance  1 Mutually Defined  0  . [DISCONTINUED] Multiple Vitamins-Minerals (ICAPS AREDS FORMULA PO) Take 1 capsule by mouth 2 (two) times daily with a meal.        No facility-administered encounter medications on file as of 04/01/2013.  : Review of Systems  HENT: Positive for trouble swallowing.   Respiratory: Positive for wheezing.   Gastrointestinal: Positive for constipation.       Incontinence  Genitourinary: Positive for hematuria.  Allergic/Immunologic: Positive for environmental allergies.  Neurological: Positive for speech difficulty and weakness.  Hematological:       Anemia    Objective:  Neurologic Exam  Physical Exam Physical Examination:   Filed Vitals:   04/01/13 1213  BP: 143/74  Pulse: 65  Temp: 98.1 F (36.7 C)    General Examination: The patient is a very pleasant 80 y.o. male in no acute distress.  HEENT: Normocephalic, atraumatic, pupils are equal, round and reactive to light and accommodation. Extraocular tracking shows severe saccadic breakdown without nystagmus noted. There is severe limitation to entire gaze. There is moderate decrease in eye blink rate. Hearing is impaired. Face is symmetric with moderate facial masking and normal facial sensation. There is no lip, neck or jaw tremor. Neck is severely rigid with decreased passive ROM. There are no carotid bruits on auscultation. Oropharynx exam reveals moderate mouth dryness. No significant airway crowding is noted. Mallampati is class III. Tongue protrudes centrally and palate elevates symmetrically.   There is no drooling.   Chest: is clear to auscultation without wheezing, rhonchi or crackles noted.  Heart: sounds are regular and normal without murmurs, rubs or gallops noted.   Abdomen: is soft, non-tender and non-distended with normal bowel  sounds  appreciated on auscultation.  Extremities: There is no pitting edema in the distal lower extremities bilaterally.   Skin: is warm and dry with no trophic changes noted. Age-related changes are noted on the skin.   Musculoskeletal: exam reveals no obvious joint deformities, tenderness, joint swelling or erythema.  Neurologically:  Mental status: The patient is awake and alert, paying poor attention. He is unable to provide the history. His daughter provides the entire history. He is oriented to: place and situation. His memory, attention, language and knowledge are impaired. There is no aphasia, agnosia, apraxia or anomia. There is a moderate degree of bradyphrenia. Speech is moderately hypophonic with mild dysarthria noted. Mood is congruent and affect is blunted.   Cranial nerves are as described above under HEENT exam.   Motor exam: Normal bulk, and strength for age is noted. There are no dyskinesias noted.   Tone is severely rigid with absence of cogwheeling. There is overall severe bradykinesia. There is no drift or rebound.  There is no tremor.  Reflexes are trace in the upper extremities and trace in the lower extremities. Toes are downgoing bilaterally.  Fine motor skills exam: Finger taps are severely impaired on the right and moderately impaired on the left. Hand movements are moderately impaired on the right and moderately impaired on the left. RAP (rapid alternating patting) is moderately impaired on the right and moderately impaired on the left. Foot taps are severely impaired on the right and severely impaired on the left. Foot agility (in the form of heel stomping) is severely impaired on the right and severely impaired on the left.    Cerebellar testing is not possible.   Sensory exam is intact to light touch.   Gait, station and balance: He was not asked to stand or walk as they did not bring his walker and I did not think it was safe to stand or walk him.     Assessment and Plan:   Assessment and Plan:  In summary, Ricky [name] is a very pleasant 79 y.o.-year old male with a history of syncopal spells, parkinsonism, memory loss, gait dysfunction, balance issues, recurrent falls and recurrent urinary tract infections. His exam is indeed most consistent with progressive supranuclear palsy.  I had a long chat with the patient and particularly his daughter Ricky [name] today about my findings and the diagnosis of PSP, the prognosis and treatment options. We talked about medical treatments and non-pharmacological approaches. We talked about trying to maintain a healthy lifestyle in general.. I encouraged the patient to eat healthy, exercise daily and keep well hydrated, to keep a scheduled bedtime and wake time routine, to not skip any meals and eat healthy snacks in between meals and to have protein with every meal. He has been tried on Sinemet in the past, Aricept as well it is most recently on rotigotine. I suggested that we not try him on another antiparkinsonian medication at this time. I would continue with the Namenda. He does need 24-7 supervision which he is getting at this point. I would like to see him back routinely in 3 months, sooner if the need arises and encouraged them to call with any interim questions, concerns, problems or updates and refill requests.

## 2013-04-09 DIAGNOSIS — Z466 Encounter for fitting and adjustment of urinary device: Secondary | ICD-10-CM

## 2013-04-09 DIAGNOSIS — G238 Other specified degenerative diseases of basal ganglia: Secondary | ICD-10-CM

## 2013-04-09 DIAGNOSIS — R488 Other symbolic dysfunctions: Secondary | ICD-10-CM

## 2013-04-09 DIAGNOSIS — R1312 Dysphagia, oropharyngeal phase: Secondary | ICD-10-CM

## 2013-04-29 ENCOUNTER — Encounter: Payer: Self-pay | Admitting: *Deleted

## 2013-05-02 ENCOUNTER — Ambulatory Visit (INDEPENDENT_AMBULATORY_CARE_PROVIDER_SITE_OTHER): Payer: Medicare HMO | Admitting: Internal Medicine

## 2013-05-02 ENCOUNTER — Encounter: Payer: Self-pay | Admitting: Internal Medicine

## 2013-05-02 VITALS — BP 160/80 | HR 58 | Temp 98.1°F | Resp 14 | Wt 178.0 lb

## 2013-05-02 DIAGNOSIS — G238 Other specified degenerative diseases of basal ganglia: Secondary | ICD-10-CM

## 2013-05-02 DIAGNOSIS — G231 Progressive supranuclear ophthalmoplegia [Steele-Richardson-Olszewski]: Secondary | ICD-10-CM

## 2013-05-02 DIAGNOSIS — R339 Retention of urine, unspecified: Secondary | ICD-10-CM

## 2013-05-02 DIAGNOSIS — D225 Melanocytic nevi of trunk: Secondary | ICD-10-CM

## 2013-05-02 DIAGNOSIS — D235 Other benign neoplasm of skin of trunk: Secondary | ICD-10-CM

## 2013-05-02 NOTE — Progress Notes (Signed)
Patient ID: Ricky [name], male   DOB: 1917-01-28, 79 y.o.   MRN: [id number] Location:  [facility]   No Known Allergies  Chief Complaint  Patient presents with  . Medical Managment of Chronic Issues    red spots on chest area only    HPI: Patient is a 79 y.o. AA male with supranuclear palsy seen in the office today for acute visit re: red spots on his chest.  [facility].  Popped up three weeks ago.  Multiple red nevi and one greenish black one.  Had two hospitalizations back to back:  Mid march and end of march to early April.  Doing amazingly well since 4/21.  Had periods of hypoactive delirium each time.  Is now on new cranberry capsule that has helped prevent deterioration.  D-mannose is what he is taking.      Review of Systems:  Review of Systems  Constitutional: Negative for fever and chills.  Eyes: Positive for blurred vision.  Respiratory: Negative for shortness of breath.   Cardiovascular: Negative for chest pain.  Gastrointestinal: Positive for constipation. Negative for blood in stool and melena.  Genitourinary: Negative for dysuria, urgency and frequency.       Neurogenic bladder  Musculoskeletal: Positive for falls.  Skin:       Red spots on skin  Neurological: Positive for dizziness. Negative for loss of consciousness.  Psychiatric/Behavioral: Positive for memory loss.     Past Medical History  Diagnosis Date  . IBS (irritable bowel syndrome)   . Diverticulosis   . Cataract   . OAB (overactive bladder)   . Macular degeneration   . Alzheimer disease   . Hypertension   . Arthritis   . Osteoporosis   . Parkinsonian syndrome   . Syncope, vasovagal   . Neuromuscular disorder     parkisonian syndrome  . 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 Unspecified transient cerebral ischemia   . Unspecified urinary incontinence   . Senile dementia, uncomplicated   .  Unspecified constipation   . Hypothyroidism   . Anemia, unspecified   . Depression   . Alzheimer's disease   . Gout, unspecified   . Thoracic or lumbosacral neuritis or radiculitis, unspecified   . Progressive supranuclear palsy   . Unspecified transient cerebral ischemia   . Unspecified urinary incontinence   . Syncope and collapse   . Paralysis agitans   . Unspecified transient cerebral ischemia   . Pneumonitis due to inhalation of food or vomitus   . Cellulitis and abscess of oral soft tissues   . Screening for lipoid disorders   . Senile dementia, uncomplicated   . Unspecified constipation   . Disorder of bone and cartilage, unspecified   . Other specified acquired hypothyroidism   . Unspecified hypothyroidism   . Anemia, unspecified   . Depressive disorder, not elsewhere classified   . Alzheimer's disease   . Paralysis agitans   . Pneumonia, organism unspecified   . Pseudomonas infection in conditions classified elsewhere and of unspecified site   . Unspecified essential hypertension   . Urinary tract infection, site not specified   . Osteoarthrosis, unspecified whether generalized or localized, unspecified site   . Osteoporosis, unspecified   . Gout, unspecified   . Thoracic or lumbosacral neuritis or radiculitis, unspecified     Past Surgical History  Procedure Laterality Date  . Esophagogastroduodenoscopy    . Eye  surgery  2006    LEFT CATARACT    Social History:   reports that he has never smoked. He has never used smokeless tobacco. He reports that he does not drink alcohol or use illicit drugs.  Family History  Problem Relation Age of Onset  . Cancer Mother   . Stroke Father   . Liver disease Father   . Kidney disease Brother   . Liver disease Brother   . Emphysema Brother     Medications: Patient's Medications  New Prescriptions   No medications on file  Previous Medications   ACETAMINOPHEN (TYLENOL) 500 MG TABLET    Take 500 mg by mouth 2 (two)  times daily.   ASPIRIN EC 325 MG EC TABLET    Take 1 tablet (325 mg total) by mouth daily.   ATORVASTATIN (LIPITOR) 10 MG TABLET    Take 1 tablet (10 mg total) by mouth daily at 6 PM.   BACITRACIN OINTMENT    Apply topically 2 (two) times daily. Apply to penis   CALCIUM CARBONATE-VIT D-MIN (CALTRATE 600+D PLUS) 600-800 MG-UNIT CHEW    Chew 1 tablet by mouth 2 (two) times daily with a meal.    CRANBERRY 500 MG CHEW    Chew 4,200 mg by mouth 3 (three) times daily. Take 4200 mg daily.   CYANOCOBALAMIN (VITAMIN B-12 PO)    Take 5,000 mcg by mouth daily.   FERROUS SULFATE 325 (65 FE) MG TABLET    Take 325 mg by mouth every other day.    GUAIFENESIN (MUCINEX) 600 MG 12 HR TABLET    Take 1,200 mg by mouth 2 (two) times daily as needed for congestion.   HYDRALAZINE (APRESOLINE) 25 MG TABLET    Take 12.5 mg by mouth 2 (two) times daily.   LACTOSE FREE NUTRITION (BOOST) LIQD    Take 0.5 Containers by mouth 2 (two) times daily between meals.    LEVOTHYROXINE (SYNTHROID, LEVOTHROID) 25 MCG TABLET    Take 25 mcg by mouth daily.   MELATONIN EXTRA STRENGTH PO    Take 15 mLs by mouth at bedtime as needed. To help sleep   MEMANTINE (NAMENDA) 10 MG TABLET    Take 10 mg by mouth 2 (two) times daily.    MULTIPLE VITAMIN (MULTIVITAMIN WITH MINERALS) TABS    Take 1 tablet by mouth daily.   POLYETHYLENE GLYCOL (MIRALAX / GLYCOLAX) PACKET    Take 17 g by mouth daily.   UNABLE TO FIND    DME POWER LIFT CHAIR   Diagnosis: Progressive supranuclear palsy, dementia with generalized debility, total assist assistance  Modified Medications   No medications on file  Discontinued Medications   ROTIGOTINE 3 MG/24HR PT24    Place 1 patch onto the skin daily.     Physical Exam: Filed Vitals:   05/02/13 1536  BP: 160/80  Pulse: 58  Temp: 98.1 F (36.7 C)  TempSrc: Oral  Resp: 14  Weight: 178 lb (80.74 kg)  SpO2: 97%   Physical Exam  Constitutional:  Frail black male seated with stooped posture in wheelchair   HENT:  Head: Normocephalic and atraumatic.  Cardiovascular: Normal rate, regular rhythm, normal heart sounds and intact distal pulses.   Pulmonary/Chest: Effort normal and breath sounds normal.  Abdominal: Soft. Bowel sounds are normal. He exhibits no distension. There is no tenderness.  Musculoskeletal: He exhibits no edema and no tenderness.  Neurological: He is alert. He exhibits abnormal muscle tone.  Supranuclear palsy  Skin: Skin is warm  and dry.  Several red nevi and black nevi on abdomen    Labs reviewed: Basic Metabolic Panel:  Recent Labs  28/41/32 0335  01/27/13 1542  02/10/13 0825 02/11/13 1722 02/12/13 0517  NA 141  < >  --   < > 141 134* 134*  K 3.7  < >  --   < > 4.4 4.0 3.9  CL 105  < >  --   < > 102 99 99  CO2 23  < >  --   < > 25 26 25   GLUCOSE 90  < >  --   < > 88 100* 99  BUN 14  < >  --   < > 12 9 7   CREATININE 0.87  < > 0.85  < > 0.89 0.81 0.79  CALCIUM 9.3  < >  --   < > 9.5 9.1 8.8  MG  --   --  2.0  --   --  2.2  --   PHOS  --   --   --   --   --  2.8  --   TSH 2.271  --  0.748  --   --   --   --   < > = values in this interval not displayed. Liver Function Tests:  Recent Labs  11/24/12 0913 01/27/13 1055 01/28/13 0520 02/10/13 0825  AST 19 19 15 21   ALT 20 15 11 23   ALKPHOS 87 93 76 89  BILITOT 0.4 0.5 0.5 0.5  PROT 7.8 8.6* 7.0 7.6  ALBUMIN 3.5 3.6 2.9*  --    No results found for this basename: LIPASE, AMYLASE,  in the last 8760 hours No results found for this basename: AMMONIA,  in the last 8760 hours CBC:  Recent Labs  01/28/13 0520 02/10/13 0825 02/11/13 1722 02/12/13 0517  WBC 6.0 5.8 8.8 8.6  NEUTROABS  --  3.8 7.0  --   HGB 11.1* 11.5* 11.2* 10.9*  HCT 34.8* 35.3* 34.7* 34.1*  MCV 86.8 86 86.3 87.0  PLT 190  --  266 268   Lipid Panel:  Recent Labs  06/21/12 0313  CHOL 176  HDL 63  LDLCALC 102*  TRIG 56  CHOLHDL 2.8   Lab Results  Component Value Date   HGBA1C 5.3 06/21/2012    Assessment/Plan 1.  Atypical nevus of abdominal wall -will monitor for changes  2. Progressive supranuclear ophthalmoplegia or palsy -progressive, is cause of his dementia, parkinsonism, and waxing and waning mentation, urinary retention from neurogenic bladder  3. Urinary retention with incomplete bladder emptying -has catheter and has frequent "UTIs"--suspect colonization is a problem and his mentation waxes and wanes from his PSP, but urine samples get done over and over again due to his hypoactive state and he winds up getting treated--unfortunately, he is developing significant resistance  Next appt: keep 7/28 appt

## 2013-05-02 NOTE — Patient Instructions (Signed)
Has periods of hypoactive delirium with UTIs.    Watch the new "nevi"--moles--that have developed on his chest.  If they develop changes, let me know.

## 2013-05-15 ENCOUNTER — Emergency Department (HOSPITAL_COMMUNITY): Payer: Medicare HMO

## 2013-05-15 ENCOUNTER — Inpatient Hospital Stay (HOSPITAL_COMMUNITY)
Admission: EM | Admit: 2013-05-15 | Discharge: 2013-05-19 | DRG: 871 | Disposition: A | Discharge status: Home Health | Payer: Medicare HMO | Attending: Internal Medicine | Admitting: Internal Medicine

## 2013-05-15 ENCOUNTER — Encounter (HOSPITAL_COMMUNITY): Payer: Self-pay | Admitting: Emergency Medicine

## 2013-05-15 DIAGNOSIS — J189 Pneumonia, unspecified organism: Secondary | ICD-10-CM

## 2013-05-15 DIAGNOSIS — N400 Enlarged prostate without lower urinary tract symptoms: Secondary | ICD-10-CM

## 2013-05-15 DIAGNOSIS — Z86718 Personal history of other venous thrombosis and embolism: Secondary | ICD-10-CM

## 2013-05-15 DIAGNOSIS — D649 Anemia, unspecified: Secondary | ICD-10-CM | POA: Diagnosis present

## 2013-05-15 DIAGNOSIS — A419 Sepsis, unspecified organism: Secondary | ICD-10-CM | POA: Diagnosis present

## 2013-05-15 DIAGNOSIS — K623 Rectal prolapse: Secondary | ICD-10-CM

## 2013-05-15 DIAGNOSIS — G20A1 Parkinson's disease without dyskinesia, without mention of fluctuations: Secondary | ICD-10-CM | POA: Diagnosis present

## 2013-05-15 DIAGNOSIS — I498 Other specified cardiac arrhythmias: Secondary | ICD-10-CM | POA: Diagnosis present

## 2013-05-15 DIAGNOSIS — I639 Cerebral infarction, unspecified: Secondary | ICD-10-CM

## 2013-05-15 DIAGNOSIS — F0280 Dementia in other diseases classified elsewhere without behavioral disturbance: Secondary | ICD-10-CM

## 2013-05-15 DIAGNOSIS — I10 Essential (primary) hypertension: Secondary | ICD-10-CM

## 2013-05-15 DIAGNOSIS — G238 Other specified degenerative diseases of basal ganglia: Secondary | ICD-10-CM | POA: Diagnosis present

## 2013-05-15 DIAGNOSIS — K3184 Gastroparesis: Secondary | ICD-10-CM

## 2013-05-15 DIAGNOSIS — R509 Fever, unspecified: Secondary | ICD-10-CM

## 2013-05-15 DIAGNOSIS — R4182 Altered mental status, unspecified: Secondary | ICD-10-CM

## 2013-05-15 DIAGNOSIS — G92 Toxic encephalopathy: Secondary | ICD-10-CM

## 2013-05-15 DIAGNOSIS — R6510 Systemic inflammatory response syndrome (SIRS) of non-infectious origin without acute organ dysfunction: Secondary | ICD-10-CM

## 2013-05-15 DIAGNOSIS — Z86711 Personal history of pulmonary embolism: Secondary | ICD-10-CM

## 2013-05-15 DIAGNOSIS — M1990 Unspecified osteoarthritis, unspecified site: Secondary | ICD-10-CM | POA: Diagnosis present

## 2013-05-15 DIAGNOSIS — E039 Hypothyroidism, unspecified: Secondary | ICD-10-CM

## 2013-05-15 DIAGNOSIS — R627 Adult failure to thrive: Secondary | ICD-10-CM

## 2013-05-15 DIAGNOSIS — M810 Age-related osteoporosis without current pathological fracture: Secondary | ICD-10-CM | POA: Diagnosis present

## 2013-05-15 DIAGNOSIS — Z79899 Other long term (current) drug therapy: Secondary | ICD-10-CM

## 2013-05-15 DIAGNOSIS — N390 Urinary tract infection, site not specified: Secondary | ICD-10-CM | POA: Diagnosis present

## 2013-05-15 DIAGNOSIS — R531 Weakness: Secondary | ICD-10-CM

## 2013-05-15 DIAGNOSIS — E038 Other specified hypothyroidism: Secondary | ICD-10-CM | POA: Diagnosis present

## 2013-05-15 DIAGNOSIS — G988 Other disorders of nervous system: Secondary | ICD-10-CM

## 2013-05-15 DIAGNOSIS — G929 Unspecified toxic encephalopathy: Secondary | ICD-10-CM | POA: Diagnosis present

## 2013-05-15 DIAGNOSIS — G20 Parkinson's disease: Secondary | ICD-10-CM

## 2013-05-15 DIAGNOSIS — Z7982 Long term (current) use of aspirin: Secondary | ICD-10-CM

## 2013-05-15 DIAGNOSIS — G309 Alzheimer's disease, unspecified: Secondary | ICD-10-CM | POA: Diagnosis present

## 2013-05-15 DIAGNOSIS — R1310 Dysphagia, unspecified: Secondary | ICD-10-CM

## 2013-05-15 DIAGNOSIS — R55 Syncope and collapse: Secondary | ICD-10-CM

## 2013-05-15 DIAGNOSIS — M109 Gout, unspecified: Secondary | ICD-10-CM

## 2013-05-15 DIAGNOSIS — Z8669 Personal history of other diseases of the nervous system and sense organs: Secondary | ICD-10-CM

## 2013-05-15 LAB — CBC
Hemoglobin: 12.1 g/dL — ABNORMAL LOW (ref 13.0–17.0)
MCH: 28.6 pg (ref 26.0–34.0)
Platelets: 147 10*3/uL — ABNORMAL LOW (ref 150–400)
RBC: 4.23 MIL/uL (ref 4.22–5.81)
WBC: 7.2 10*3/uL (ref 4.0–10.5)

## 2013-05-15 LAB — URINALYSIS, ROUTINE W REFLEX MICROSCOPIC
Nitrite: NEGATIVE
Protein, ur: 30 mg/dL — AB
Specific Gravity, Urine: 1.012 (ref 1.005–1.030)
Urobilinogen, UA: 0.2 mg/dL (ref 0.0–1.0)

## 2013-05-15 LAB — URINALYSIS W MICROSCOPIC + REFLEX CULTURE
Glucose, UA: NEGATIVE mg/dL
Ketones, ur: NEGATIVE mg/dL
Nitrite: POSITIVE — AB
Specific Gravity, Urine: 1.012 (ref 1.005–1.030)
pH: 7.5 (ref 5.0–8.0)

## 2013-05-15 LAB — CBC WITH DIFFERENTIAL/PLATELET
Basophils Relative: 0 % (ref 0–1)
Eosinophils Absolute: 0 10*3/uL (ref 0.0–0.7)
Eosinophils Relative: 0 % (ref 0–5)
MCH: 28.5 pg (ref 26.0–34.0)
MCHC: 32.9 g/dL (ref 30.0–36.0)
MCV: 86.6 fL (ref 78.0–100.0)
Neutrophils Relative %: 75 % (ref 43–77)
Platelets: 159 10*3/uL (ref 150–400)

## 2013-05-15 LAB — COMPREHENSIVE METABOLIC PANEL
ALT: 17 U/L (ref 0–53)
Albumin: 3.8 g/dL (ref 3.5–5.2)
Alkaline Phosphatase: 87 U/L (ref 39–117)
BUN: 9 mg/dL (ref 6–23)
Calcium: 9.4 mg/dL (ref 8.4–10.5)
GFR calc Af Amer: 63 mL/min — ABNORMAL LOW (ref >90)
Potassium: 4.3 mEq/L (ref 3.5–5.1)
Sodium: 137 mEq/L (ref 135–145)
Total Protein: 8.1 g/dL (ref 6.0–8.3)

## 2013-05-15 LAB — CREATININE, SERUM
Creatinine, Ser: 1 mg/dL (ref 0.50–1.35)
GFR calc Af Amer: 73 mL/min — ABNORMAL LOW (ref >90)
GFR calc non Af Amer: 63 mL/min — ABNORMAL LOW (ref >90)

## 2013-05-15 LAB — URINE MICROSCOPIC-ADD ON

## 2013-05-15 MED ORDER — GUAIFENESIN ER 600 MG PO TB12
600.0000 mg | ORAL_TABLET | Freq: Two times a day (BID) | ORAL | Status: DC | PRN
  Filled 2013-05-15: qty 1

## 2013-05-15 MED ORDER — SODIUM CHLORIDE 0.9 % IJ SOLN
3.0000 mL | Freq: Two times a day (BID) | INTRAMUSCULAR | Status: DC
  Administered 2013-05-15 – 2013-05-18 (×2): 3 mL via INTRAVENOUS

## 2013-05-15 MED ORDER — HEPARIN SODIUM (PORCINE) 5000 UNIT/ML IJ SOLN
5000.0000 [IU] | Freq: Three times a day (TID) | INTRAMUSCULAR | Status: DC
  Administered 2013-05-15 – 2013-05-19 (×11): 5000 [IU] via SUBCUTANEOUS
  Filled 2013-05-15 (×14): qty 1

## 2013-05-15 MED ORDER — SODIUM CHLORIDE 0.9 % IV SOLN
INTRAVENOUS | Status: DC
  Administered 2013-05-18: 07:00:00 via INTRAVENOUS

## 2013-05-15 MED ORDER — SODIUM CHLORIDE 0.9 % IV SOLN: INTRAVENOUS | Status: AC

## 2013-05-15 MED ORDER — ACETAMINOPHEN 500 MG PO TABS
500.0000 mg | ORAL_TABLET | Freq: Two times a day (BID) | ORAL | Status: DC
  Administered 2013-05-15 – 2013-05-16 (×2): 1000 mg via ORAL
  Administered 2013-05-16: 500 mg via ORAL
  Administered 2013-05-17 – 2013-05-18 (×4): 1000 mg via ORAL
  Filled 2013-05-15 (×9): qty 2

## 2013-05-15 MED ORDER — HYDRALAZINE HCL 25 MG PO TABS
12.5000 mg | ORAL_TABLET | Freq: Two times a day (BID) | ORAL | Status: DC
  Administered 2013-05-15 – 2013-05-18 (×6): 12.5 mg via ORAL
  Filled 2013-05-15 (×7): qty 0.5

## 2013-05-15 MED ORDER — MEMANTINE HCL 10 MG PO TABS
10.0000 mg | ORAL_TABLET | Freq: Two times a day (BID) | ORAL | Status: DC
  Administered 2013-05-15 – 2013-05-19 (×8): 10 mg via ORAL
  Filled 2013-05-15 (×9): qty 1

## 2013-05-15 MED ORDER — SODIUM CHLORIDE 0.9 % IV SOLN
INTRAVENOUS | Status: DC
  Administered 2013-05-15: 12:00:00 via INTRAVENOUS

## 2013-05-15 MED ORDER — CIPROFLOXACIN IN D5W 400 MG/200ML IV SOLN
400.0000 mg | Freq: Once | INTRAVENOUS | Status: AC
  Administered 2013-05-15: 400 mg via INTRAVENOUS
  Filled 2013-05-15: qty 200

## 2013-05-15 MED ORDER — SODIUM CHLORIDE 0.9 % IV SOLN
INTRAVENOUS | Status: DC
  Administered 2013-05-17: 12:00:00 via INTRAVENOUS

## 2013-05-15 MED ORDER — FERROUS SULFATE 325 (65 FE) MG PO TABS
325.0000 mg | ORAL_TABLET | ORAL | Status: DC
  Administered 2013-05-16 – 2013-05-18 (×2): 325 mg via ORAL
  Filled 2013-05-15 (×2): qty 1

## 2013-05-15 MED ORDER — LEVOFLOXACIN IN D5W 750 MG/150ML IV SOLN
750.0000 mg | INTRAVENOUS | Status: DC
  Administered 2013-05-16: 750 mg via INTRAVENOUS
  Filled 2013-05-15 (×2): qty 150

## 2013-05-15 MED ORDER — LEVOTHYROXINE SODIUM 25 MCG PO TABS
25.0000 ug | ORAL_TABLET | Freq: Every day | ORAL | Status: DC
  Administered 2013-05-16 – 2013-05-19 (×4): 25 ug via ORAL
  Filled 2013-05-15 (×6): qty 1

## 2013-05-15 MED ORDER — BOOST PO LIQD
0.5000 | Freq: Two times a day (BID) | ORAL | Status: DC
  Administered 2013-05-17 – 2013-05-18 (×2): 119 mL via ORAL
  Administered 2013-05-18: 14:00:00 via ORAL
  Administered 2013-05-18: 119 mL via ORAL
  Administered 2013-05-18: 10:00:00 via ORAL
  Filled 2013-05-15 (×11): qty 237

## 2013-05-15 MED ORDER — ASPIRIN EC 325 MG PO TBEC
325.0000 mg | DELAYED_RELEASE_TABLET | Freq: Every day | ORAL | Status: DC
  Administered 2013-05-16 – 2013-05-18 (×3): 325 mg via ORAL
  Filled 2013-05-15 (×5): qty 1

## 2013-05-15 NOTE — ED Provider Notes (Signed)
History    CSN: [id number] Arrival date & time 05/15/13  1000  First MD Initiated Contact with Patient 05/15/13 1006     Chief Complaint  Patient presents with  . Altered Mental Status    The history is provided by a caregiver. The history is limited by the condition of the patient (Hx dementia).  Pt was seen at 1005.  Per pt's family, c/o pt with gradual onset and worsening of persistent AMS and lethargy for the past 1 week, worse over the past several days. States "when he gets like this he usually has a UTI." States he was eval by his Uro MD Nesi 3 days ago, started on cipro for presumed UTI (UC is pending). Family states pt foley was not changed at that time; last change approx 2 weeks ago.  Family also concerned regarding aspiration, as pt was "eating very fast yesterday" and has hx of dysphagia.  Endorses pt has been coughing and intermittently wheezing since yesterday after eating. Home temps to "99."  Denies vomiting/diarrhea, no SOB.    Past Medical History  Diagnosis Date  . IBS (irritable bowel syndrome)   . Diverticulosis   . Cataract   . OAB (overactive bladder)   . Macular degeneration   . Alzheimer disease   . Hypertension   . Arthritis   . Osteoporosis   . Parkinsonian syndrome   . Syncope, vasovagal   . Neuromuscular disorder     parkisonian syndrome  . 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 Unspecified transient cerebral ischemia   . Unspecified urinary incontinence   . Senile dementia, uncomplicated   . Unspecified constipation   . Hypothyroidism   . Anemia, unspecified   . Depression   . Alzheimer's disease   . Gout, unspecified   . Thoracic or lumbosacral neuritis or radiculitis, unspecified   . Progressive supranuclear palsy   . Unspecified transient cerebral ischemia   . Unspecified urinary incontinence   . Syncope and collapse   . Paralysis agitans   . Unspecified transient cerebral ischemia   . Pneumonitis due  to inhalation of food or vomitus   . Cellulitis and abscess of oral soft tissues   . Screening for lipoid disorders   . Senile dementia, uncomplicated   . Unspecified constipation   . Disorder of bone and cartilage, unspecified   . Other specified acquired hypothyroidism   . Unspecified hypothyroidism   . Anemia, unspecified   . Depressive disorder, not elsewhere classified   . Alzheimer's disease   . Paralysis agitans   . Pneumonia, organism unspecified   . Pseudomonas infection in conditions classified elsewhere and of unspecified site   . Unspecified essential hypertension   . Urinary tract infection, site not specified   . Osteoarthrosis, unspecified whether generalized or localized, unspecified site   . Osteoporosis, unspecified   . Gout, unspecified   . Thoracic or lumbosacral neuritis or radiculitis, unspecified    Past Surgical History  Procedure Laterality Date  . Esophagogastroduodenoscopy    . Eye surgery  2006    LEFT CATARACT   Family History  Problem Relation Age of Onset  . Cancer Mother   . Stroke Father   . Liver disease Father   . Kidney disease Brother   . Liver disease Brother   . Emphysema Brother    History  Substance Use Topics  . Smoking status: Never Smoker   . Smokeless tobacco: Never Used  . Alcohol Use: No  Review of Systems  Unable to perform ROS: Dementia    Allergies  Review of patient's allergies indicates no known allergies.  Home Medications   Current Outpatient Rx  Name  Route  Sig  Dispense  Refill  . acetaminophen (TYLENOL) 500 MG tablet   Oral   Take 500-1,000 mg by mouth 2 (two) times daily.          Marland Kitchen aspirin EC 325 MG EC tablet   Oral   Take 1 tablet (325 mg total) by mouth daily.   30 tablet   3   . atorvastatin (LIPITOR) 10 MG tablet   Oral   Take 1 tablet (10 mg total) by mouth daily at 6 PM.   30 tablet   0   . bacitracin ointment   Topical   Apply topically 2 (two) times daily. Apply to penis    120 g   3   . Calcium Carbonate-Vit D-Min (CALTRATE 600+D PLUS) 600-800 MG-UNIT CHEW   Oral   Chew 1 tablet by mouth 2 (two) times daily with a meal.          . cetirizine (ZYRTEC) 10 MG tablet   Oral   Take 10 mg by mouth daily.         . Cranberry 500 MG CHEW   Oral   Chew 4,200 mg by mouth 3 (three) times daily.          . Cyanocobalamin (VITAMIN B-12 PO)   Oral   Take 5,000 mcg by mouth daily.         Marland Kitchen guaiFENesin (MUCINEX) 600 MG 12 hr tablet   Oral   Take 600 mg by mouth 2 (two) times daily as needed for congestion.          . hydrALAZINE (APRESOLINE) 25 MG tablet   Oral   Take 12.5 mg by mouth 2 (two) times daily.         Marland Kitchen lactose free nutrition (BOOST) LIQD   Oral   Take 0.5 Containers by mouth 2 (two) times daily between meals.          Marland Kitchen levothyroxine (SYNTHROID, LEVOTHROID) 25 MCG tablet   Oral   Take 25 mcg by mouth daily.         Marland Kitchen MELATONIN EXTRA STRENGTH PO   Oral   Take 15 mLs by mouth at bedtime as needed (sleep). To help sleep         . memantine (NAMENDA) 10 MG tablet   Oral   Take 10 mg by mouth 2 (two) times daily.          . Multiple Vitamin (MULTIVITAMIN WITH MINERALS) TABS   Oral   Take 1 tablet by mouth daily.         . Multiple Vitamins-Minerals (ICAPS AREDS FORMULA PO)   Oral   Take 2 tablets by mouth 2 (two) times daily.         . polyethylene glycol (MIRALAX / GLYCOLAX) packet   Oral   Take 17 g by mouth daily.         Marland Kitchen sulfamethoxazole-trimethoprim (BACTRIM DS,SEPTRA DS) 800-160 MG per tablet   Oral   Take 1 tablet by mouth 2 (two) times daily. 7 day course. Started on May 12, 2013.         . ferrous sulfate 325 (65 FE) MG tablet   Oral   Take 325 mg by mouth every other day.  BP 151/71  Pulse 64  Temp(Src) 99.1 F (37.3 C) (Rectal)  Resp 15  Ht 5\' 10"  (1.778 m)  Wt 180 lb (81.647 kg)  BMI 25.83 kg/m2  SpO2 97%  Physical Exam 1010: Physical examination:  Nursing notes  reviewed; Vital signs and O2 SAT reviewed;  Constitutional: Well developed, Well nourished, In no acute distress; Head:  Normocephalic, atraumatic; Eyes: EOMI, PERRL, No scleral icterus; ENMT: Mouth and pharynx normal, Mucous membranes dry; Neck: Supple, Full range of motion, No lymphadenopathy; Cardiovascular: Regular rate and rhythm, No gallop; Respiratory: Breath sounds clear & equal bilaterally, No rales, rhonchi, wheezes.  Speaking full sentences with ease, Normal respiratory effort/excursion; Chest: Nontender, Movement normal; Abdomen: Soft, Nontender, Nondistended, Normal bowel sounds; Genitourinary: No CVA tenderness. +foley catheter with clear yellow urine in bag.; Extremities: Pulses normal, No tenderness, No edema, No calf edema or asymmetry.; Neuro: Awake, alert, confused re: time, place, events per hx dementia. No facial droop. Speech clear. Moves extremities on stretcher.; Skin: Color normal, Warm, Dry.   ED Course  Procedures     MDM  MDM Reviewed: previous chart, nursing note and vitals Reviewed previous: ECG and labs Interpretation: ECG, labs, x-ray and CT scan    Date: 05/15/2013  Rate: 61  Rhythm: normal sinus rhythm  QRS Axis: left  Intervals: normal  ST/T Wave abnormalities: normal  Conduction Disutrbances:nonspecific intraventricular conduction delay  Narrative Interpretation:   Old EKG Reviewed: unchanged; no significant changes from previous EKG dated 02/11/2013.  Results for orders placed during the hospital encounter of 05/15/13  CBC WITH DIFFERENTIAL      Result Value Range   WBC 7.8  4.0 - 10.5 K/uL   RBC 4.25  4.22 - 5.81 MIL/uL   Hemoglobin 12.1 (*) 13.0 - 17.0 g/dL   HCT 16.1 (*) 09.6 - 04.5 %   MCV 86.6  78.0 - 100.0 fL   MCH 28.5  26.0 - 34.0 pg   MCHC 32.9  30.0 - 36.0 g/dL   RDW 40.9  81.1 - 91.4 %   Platelets 159  150 - 400 K/uL   Neutrophils Relative % 75  43 - 77 %   Neutro Abs 5.8  1.7 - 7.7 K/uL   Lymphocytes Relative 16  12 - 46 %    Lymphs Abs 1.3  0.7 - 4.0 K/uL   Monocytes Relative 9  3 - 12 %   Monocytes Absolute 0.7  0.1 - 1.0 K/uL   Eosinophils Relative 0  0 - 5 %   Eosinophils Absolute 0.0  0.0 - 0.7 K/uL   Basophils Relative 0  0 - 1 %   Basophils Absolute 0.0  0.0 - 0.1 K/uL  COMPREHENSIVE METABOLIC PANEL      Result Value Range   Sodium 137  135 - 145 mEq/L   Potassium 4.3  3.5 - 5.1 mEq/L   Chloride 100  96 - 112 mEq/L   CO2 26  19 - 32 mEq/L   Glucose, Bld 87  70 - 99 mg/dL   BUN 9  6 - 23 mg/dL   Creatinine, Ser 7.82  0.50 - 1.35 mg/dL   Calcium 9.4  8.4 - 95.6 mg/dL   Total Protein 8.1  6.0 - 8.3 g/dL   Albumin 3.8  3.5 - 5.2 g/dL   AST 23  0 - 37 U/L   ALT 17  0 - 53 U/L   Alkaline Phosphatase 87  39 - 117 U/L   Total Bilirubin 0.5  0.3 -  1.2 mg/dL   GFR calc non Af Amer 54 (*) >90 mL/min   GFR calc Af Amer 63 (*) >90 mL/min  URINALYSIS W MICROSCOPIC + REFLEX CULTURE      Result Value Range   Color, Urine YELLOW  YELLOW   APPearance CLEAR  CLEAR   Specific Gravity, Urine 1.012  1.005 - 1.030   pH 7.5  5.0 - 8.0   Glucose, UA NEGATIVE  NEGATIVE mg/dL   Hgb urine dipstick TRACE (*) NEGATIVE   Bilirubin Urine NEGATIVE  NEGATIVE   Ketones, ur NEGATIVE  NEGATIVE mg/dL   Protein, ur NEGATIVE  NEGATIVE mg/dL   Urobilinogen, UA 0.2  0.0 - 1.0 mg/dL   Nitrite POSITIVE (*) NEGATIVE   Leukocytes, UA SMALL (*) NEGATIVE   WBC, UA 7-10  <3 WBC/hpf   RBC / HPF 0-2  <3 RBC/hpf   Bacteria, UA FEW (*) RARE   Squamous Epithelial / LPF RARE  RARE  TROPONIN I      Result Value Range   Troponin I <0.30  <0.30 ng/mL  LACTIC ACID, PLASMA      Result Value Range   Lactic Acid, Venous 1.8  0.5 - 2.2 mmol/L   Dg Chest 2 View 05/15/2013   *RADIOLOGY REPORT*  Clinical Data: Altered mental status.  CHEST - 2 VIEW  Comparison: 02/13/2013 and 02/11/2013.  Findings: There is stable cardiomegaly and aortic atherosclerosis. There is mild atelectasis superimposed on chronic lung disease.  No confluent airspace  opacity or significant pleural effusion is present.  The bones are demineralized but appear stable.  IMPRESSION: Mild bibasilar atelectasis superimposed on chronic lung disease. No acute findings demonstrated.   Original Report Authenticated By: Carey Bullocks, M.D.   Ct Head Wo Contrast 05/15/2013   *RADIOLOGY REPORT*  Clinical Data: Altered mental status  CT HEAD WITHOUT CONTRAST  Technique:  Contiguous axial images were obtained from the base of the skull through the vertex without contrast.  Comparison: 02/02/2013  Findings: Kyphosis and suboptimal positioning limits the exam.  Chronic ischemic changes in the periventricular white matter and global atrophy are stable.  No mass effect, midline shift, or acute intracranial hemorrhage.  The cranium is intact without obvious fracture.  Partial opacification of the sphenoid sinus is again noted.  IMPRESSION: Chronic ischemic changes and atrophy.  Stable.   Original Report Authenticated By: Jolaine Click, M.D.    1345:  Possibly UTI, UC is pending.  Dx and testing d/w pt's family.  Questions answered.  Verb understanding. Family states pt "decompensates very quickly" and that they "brought him in early this time."  Family is requesting admission. T/C to [facility] Dr. Mahala Menghini, case discussed, including:  HPI, pertinent PM/SHx, VS/PE, dx testing, ED course and treatment:  Agreeable to admit, requests to change foley catheter and obtain new UA/UC, write temporary orders, obtain medical bed to team 10.         Laray Anger, DO 05/18/13 2025

## 2013-05-15 NOTE — Progress Notes (Signed)
ANTIBIOTIC CONSULT NOTE - INITIAL  Pharmacy Consult for levaquin Indication: empiric coverage for lungs + urine  No Known Allergies  Patient Measurements: Height: 5\' 10"  (177.8 cm) Weight: 180 lb (81.647 kg) IBW/kg (Calculated) : 73  Vital Signs: Temp: 100.2 F (37.9 C) (06/29 1640) Temp src: Axillary (06/29 1640) BP: 174/88 mmHg (06/29 1640) Pulse Rate: 70 (06/29 1640) Intake/Output from previous day:   Intake/Output from this shift:    Labs:  Recent Labs  05/15/13 1150  WBC 7.8  HGB 12.1*  PLT 159  CREATININE 1.13   Estimated Creatinine Clearance: 42.2 ml/min (by C-G formula based on Cr of 1.13). No results found for this basename: VANCOTROUGH, VANCOPEAK, VANCORANDOM, GENTTROUGH, GENTPEAK, GENTRANDOM, TOBRATROUGH, TOBRAPEAK, TOBRARND, AMIKACINPEAK, AMIKACINTROU, AMIKACIN,  in the last 72 hours   Microbiology: No results found for this or any previous visit (from the past 720 hour(s)).  Medical History: Past Medical History  Diagnosis Date  . IBS (irritable bowel syndrome)   . Diverticulosis   . Cataract   . OAB (overactive bladder)   . Macular degeneration   . Alzheimer disease   . Hypertension   . Arthritis   . Osteoporosis   . Parkinsonian syndrome   . Syncope, vasovagal   . Neuromuscular disorder     parkisonian syndrome  . 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 Unspecified transient cerebral ischemia   . Unspecified urinary incontinence   . Senile dementia, uncomplicated   . Unspecified constipation   . Hypothyroidism   . Anemia, unspecified   . Depression   . Alzheimer's disease   . Gout, unspecified   . Thoracic or lumbosacral neuritis or radiculitis, unspecified   . Progressive supranuclear palsy   . Unspecified transient cerebral ischemia   . Unspecified urinary incontinence   . Syncope and collapse   . Paralysis agitans   . Unspecified transient cerebral ischemia   . Pneumonitis due to inhalation of  food or vomitus   . Cellulitis and abscess of oral soft tissues   . Screening for lipoid disorders   . Senile dementia, uncomplicated   . Unspecified constipation   . Disorder of bone and cartilage, unspecified   . Other specified acquired hypothyroidism   . Unspecified hypothyroidism   . Anemia, unspecified   . Depressive disorder, not elsewhere classified   . Alzheimer's disease   . Paralysis agitans   . Pneumonia, organism unspecified   . Pseudomonas infection in conditions classified elsewhere and of unspecified site   . Unspecified essential hypertension   . Urinary tract infection, site not specified   . Osteoarthrosis, unspecified whether generalized or localized, unspecified site   . Osteoporosis, unspecified   . Gout, unspecified   . Thoracic or lumbosacral neuritis or radiculitis, unspecified     Medications:  Anti-infectives   Start     Dose/Rate Route Frequency Ordered Stop   05/16/13 0800  levofloxacin (LEVAQUIN) IVPB 750 mg     750 mg 100 mL/hr over 90 Minutes Intravenous Every 48 hours 05/15/13 1736     05/15/13 1415  ciprofloxacin (CIPRO) IVPB 400 mg     400 mg 200 mL/hr over 60 Minutes Intravenous  Once 05/15/13 1412 05/15/13 1656     Assessment: 89 yom presented to the hospital with toxic metabolic encephalopathy. He has already received cipro earlier today. Pt is elderly with CrCl <20ml/hr. He is currently afebrile and WBC is WNL.   Levaquin 6/29>> Cipro 6/29 x 1  Goal of Therapy:  Eradication of infection  Plan:  1. Levaquin 750mg  IV Q48H - start tomorrow since pt already received a dose of cipro today 2. F/u renal fxn, C&S, clinical status   Ricky [name], Drake Leach 05/15/2013,5:37 PM

## 2013-05-15 NOTE — ED Notes (Signed)
Patient presents to ED via EMS from home with family. Family states patient is altered more than normal . CBG 99 with EMS.  Patient has foley in place on arrival to ED. Last seen normal 6pm last night.

## 2013-05-15 NOTE — Progress Notes (Signed)
Dr Mahala Menghini notified of patient temperature 100.2 Axillary, Hypertension. No new orders. Continue to monitor.

## 2013-05-15 NOTE — H&P (Addendum)
[facility] History and Physical  TALOR CHEEMA NFA:[id number] DOB: Jun 06, 1917 DOA: 05/15/2013  Referring physician: Clarene Duke PCP: Bufford Spikes, DO  Specialists: Consider consulting urology versus just getting results as per below  SON [name] Cormany   # c [phone]       # h [phone] HCPOA duaghter Dock Baccam        # c [phone]   Chief Complaint: TOxic metabolic encephalopathy  HPI: AAIDEN DEPOY is a 79 y.o. male with multiple co-morbids. He was d/c from 02/16/13 foll UTI's and was hospitalized for IV abx-he went home with HH/Pt/Ot- and also had a pressure wound-was doing good until 6/25-He was also allegedly placed on d-mannose he has been on this chronically to help prevent UTi's and had to use this x 4 between April 2 and 6/25 as family was  convinced this helped prevent the UTI's Apparently on 6/16 he had his catheter changed as cath came out and another was placed-getting them changed about x 1 monthly  On 6/25 he stated he had a sensation to urinate-despite catheter-he denied pain or throbbing or feeling unusual.  He then developed some incontinence  that night some bubbles were noted in the Urinary catheter-his normal temp is 96.6-anytime above 98 it is considered a fever by the family.  Bubbles still seen-went to Dr. [facility]-Was placed on Septra DS, started 6/26 pm bid-per patient's son report, the urinalysis seemed bland per his recollection.    He seemed well as of 6/28 but then overnight 05/12/2028 he started becoming a little bit less able to ambulate, he started to have more difficulty walking than usual for him, he was not able to stand at the sink as well as he normally is able to, and he started coughing. Son relates that patient ate a lot more quickly than he usually does and was concerned about him developing a cough overnight with his faster than usual rate of eating-he was given Tylenol as well as cough  drop and the reason for coming to the emergency room was that he seemed 75% on his way to having a full-blown? Urinary tract infection per son.  Patient's son relates also that patient has had a chronic indwelling Foley catheter since about Jan this year secondary to retained urine-he usually has but had 100 600 residual and had frequent episodes of urination and it was ultimately felt that this would be the best option for him in terms of comfort and convenience He has occasional episodes where the catheter either gets kinked or he feels sort of strenuous sensation and pelvis again last night 6/28  Review of Systems: Patient himself is pleasant and alert and in no apparent distress. He answers slowly and cannot give a full review of systems but indicates he clearly does not feel feverish or had any chest pain-he can tell me he is in [place] and at [facility] that it takes him about 45 seconds time his son's name was at bedside. Most of the history secondary to pain from his son at bedside   Past Medical History  Diagnosis Date  . IBS (irritable bowel syndrome)   . Diverticulosis   . Cataract   . OAB (overactive bladder)   . Macular degeneration   .  Alzheimer disease   . Hypertension   . Arthritis   . Osteoporosis   . Parkinsonian syndrome   . Syncope, vasovagal   . Neuromuscular disorder     parkisonian syndrome  . 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 Unspecified transient cerebral ischemia   . Unspecified urinary incontinence   . Senile dementia, uncomplicated   . Unspecified constipation   . Hypothyroidism   . Anemia, unspecified   . Depression   . Alzheimer's disease   . Gout, unspecified   . Thoracic or lumbosacral neuritis or radiculitis, unspecified   . Progressive supranuclear palsy   . Unspecified transient cerebral ischemia   . Unspecified urinary incontinence   . Syncope and collapse   . Paralysis agitans   . Unspecified  transient cerebral ischemia   . Pneumonitis due to inhalation of food or vomitus   . Cellulitis and abscess of oral soft tissues   . Screening for lipoid disorders   . Senile dementia, uncomplicated   . Unspecified constipation   . Disorder of bone and cartilage, unspecified   . Other specified acquired hypothyroidism   . Unspecified hypothyroidism   . Anemia, unspecified   . Depressive disorder, not elsewhere classified   . Alzheimer's disease   . Paralysis agitans   . Pneumonia, organism unspecified   . Pseudomonas infection in conditions classified elsewhere and of unspecified site   . Unspecified essential hypertension   . Urinary tract infection, site not specified   . Osteoarthrosis, unspecified whether generalized or localized, unspecified site   . Osteoporosis, unspecified   . Gout, unspecified   . Thoracic or lumbosacral neuritis or radiculitis, unspecified    Admission c 3/28 MRSA UTI/Met Ecephalopathy/Neurogenic bladderc Chronic indwelling foley Admission with c 3/13 Adult FTT, Progressive supranuclear plasy Admission c 07/01/12 syncope  Admission 06/20/12 with PNA, L sided weakness, ? Parkinson's Admission 02/17/12 with AMs /Syncope/Macrobid UTI Admission 11/12/11 c likely PNA Admission 04/02/09 with Acute PE/L Lower Cavitary PNA Admission 01/19/09 Vasovagal syncope Seen by Gi for fecal incontinence/IBS/GERD  Past Surgical History  Procedure Laterality Date  . Esophagogastroduodenoscopy    . Eye surgery  2006    LEFT CATARACT   Social History:  reports that he has never smoked. He has never used smokeless tobacco. He reports that he does not drink alcohol or use illicit drugs. Patient lives at home and has full-time care with his son   No Known Allergies  Family History  Problem Relation Age of Onset  . Cancer Mother   . Stroke Father   . Liver disease Father   . Kidney disease Brother   . Liver disease Brother   . Emphysema Brother    Prior to Admission  medications   Medication Sig Start Date End Date Taking? Authorizing Provider  acetaminophen (TYLENOL) 500 MG tablet Take 500-1,000 mg by mouth 2 (two) times daily.    Yes Historical Provider, MD  aspirin EC 325 MG EC tablet Take 1 tablet (325 mg total) by mouth daily. 02/03/13  Yes Ripudeep Jenna Luo, MD  atorvastatin (LIPITOR) 10 MG tablet Take 1 tablet (10 mg total) by mouth daily at 6 PM. 06/24/12 06/24/13 Yes Jessica U Vann, DO  bacitracin ointment Apply topically 2 (two) times daily. Apply to penis 02/02/13  Yes Ripudeep Jenna Luo, MD  Calcium Carbonate-Vit D-Min (CALTRATE 600+D PLUS) 600-800 MG-UNIT CHEW Chew 1 tablet by mouth 2 (two) times daily with a meal.    Yes Historical Provider, MD  cetirizine (ZYRTEC) 10 MG tablet Take 10 mg by mouth daily.   Yes Historical Provider, MD  Cranberry 500 MG CHEW Chew 4,200 mg by mouth 3 (three) times daily.    Yes Historical Provider, MD  Cyanocobalamin (VITAMIN B-12 PO) Take 5,000 mcg by mouth daily.   Yes Historical Provider, MD  guaiFENesin (MUCINEX) 600 MG 12 hr tablet Take 600 mg by mouth 2 (two) times daily as needed for congestion.    Yes Historical Provider, MD  hydrALAZINE (APRESOLINE) 25 MG tablet Take 12.5 mg by mouth 2 (two) times daily. 02/02/13  Yes Ripudeep Jenna Luo, MD  lactose free nutrition (BOOST) LIQD Take 0.5 Containers by mouth 2 (two) times daily between meals.    Yes Historical Provider, MD  levothyroxine (SYNTHROID, LEVOTHROID) 25 MCG tablet Take 25 mcg by mouth daily.   Yes Historical Provider, MD  MELATONIN EXTRA STRENGTH PO Take 15 mLs by mouth at bedtime as needed (sleep). To help sleep   Yes Historical Provider, MD  memantine (NAMENDA) 10 MG tablet Take 10 mg by mouth 2 (two) times daily.    Yes Historical Provider, MD  Multiple Vitamin (MULTIVITAMIN WITH MINERALS) TABS Take 1 tablet by mouth daily.   Yes Historical Provider, MD  Multiple Vitamins-Minerals (ICAPS AREDS FORMULA PO) Take 2 tablets by mouth 2 (two) times daily.   Yes Historical  Provider, MD  polyethylene glycol (MIRALAX / GLYCOLAX) packet Take 17 g by mouth daily.   Yes Historical Provider, MD  sulfamethoxazole-trimethoprim (BACTRIM DS,SEPTRA DS) 800-160 MG per tablet Take 1 tablet by mouth 2 (two) times daily. 7 day course. Started on May 12, 2013.   Yes Historical Provider, MD  ferrous sulfate 325 (65 FE) MG tablet Take 325 mg by mouth every other day.     Historical Provider, MD   Physical Exam: Filed Vitals:   05/15/13 1215 05/15/13 1230 05/15/13 1245 05/15/13 1300  BP: 149/79 160/79 161/75 142/82  Pulse: 61 60 66 67  Temp:      TempSrc:      Resp: 19 20 21 21   Height:      Weight:      SpO2: 95% 98% 95% 95%     General:  Alert pleasant African American male, laying in bed, arms contracted over chest   Eyes: Patient will his eyes tightly and will not allow me to examined   ENT: Moderate dentition   Neck: No thyromegaly, JVD, bruit   Cardiovascular: S1-S2 no murmur rub or   Respiratory: Clinically clear, but coughing at bedside   Abdomen: Soft nontender nondistended   Skin: Lower extremity on the left side is normal right side there seems to be a small abrasion to the front Chin and patient's leg seems a little warm   Musculoskeletal: Range of motion intact   Psychiatric: Euthymic but quiet   Neurologic: Arms contracted over chest some type of carpopedal spasm. Able to move limbs and able to orient to some extent but slow mentation-per patient's son, this does occur infrequently and patient baseline usually is very well and able to interact and choke with family   Labs on Admission:  Basic Metabolic Panel:  Recent Labs Lab 05/15/13 1150  NA 137  K 4.3  CL 100  CO2 26  GLUCOSE 87  BUN 9  CREATININE 1.13  CALCIUM 9.4   Liver Function Tests:  Recent Labs Lab 05/15/13 1150  AST 23  ALT 17  ALKPHOS 87  BILITOT 0.5  PROT 8.1  ALBUMIN  3.8   No results found for this basename: LIPASE, AMYLASE,  in the last 168 hours No  results found for this basename: AMMONIA,  in the last 168 hours CBC:  Recent Labs Lab 05/15/13 1150  WBC 7.8  NEUTROABS 5.8  HGB 12.1*  HCT 36.8*  MCV 86.6  PLT 159   Cardiac Enzymes:  Recent Labs Lab 05/15/13 1150  TROPONINI <0.30    BNP (last 3 results)  Recent Labs  07/01/12 1632  PROBNP 118.0*   CBG: No results found for this basename: GLUCAP,  in the last 168 hours  Radiological Exams on Admission: Dg Chest 2 View  05/15/2013   *RADIOLOGY REPORT*  Clinical Data: Altered mental status.  CHEST - 2 VIEW  Comparison: 02/13/2013 and 02/11/2013.  Findings: There is stable cardiomegaly and aortic atherosclerosis. There is mild atelectasis superimposed on chronic lung disease.  No confluent airspace opacity or significant pleural effusion is present.  The bones are demineralized but appear stable.  IMPRESSION: Mild bibasilar atelectasis superimposed on chronic lung disease. No acute findings demonstrated.   Original Report Authenticated By: Carey Bullocks, M.D.   Ct Head Wo Contrast  05/15/2013   *RADIOLOGY REPORT*  Clinical Data: Altered mental status  CT HEAD WITHOUT CONTRAST  Technique:  Contiguous axial images were obtained from the base of the skull through the vertex without contrast.  Comparison: 02/02/2013  Findings: Kyphosis and suboptimal positioning limits the exam.  Chronic ischemic changes in the periventricular white matter and global atrophy are stable.  No mass effect, midline shift, or acute intracranial hemorrhage.  The cranium is intact without obvious fracture.  Partial opacification of the sphenoid sinus is again noted.  IMPRESSION: Chronic ischemic changes and atrophy.  Stable.   Original Report Authenticated By: Jolaine Click, M.D.    EKG: Independently reviewed. None performed   Assessment/Plan Principal Problem:   Toxic encephalopathy, ? Infectious-exact etiology unclear. Please call [facility] in the morning to get results from urine culture  performed 05/11/2013. Catheter has been discontinued in emergency room and a new one placed and use sample of urine analysis and culture have been taken. Please follow. Agree with continuing relatively narrow spectrum levofloxacin for now as this has both relatively moderate lung, urine coverage. Repeat CBC, two-view chest x-ray a.m. Mental status at baseline is somewhat fluctuant and patient does have history of progressive supranuclear palsy however this is typically how he presents with infections Progressive supranuclear palsy-follows with a new neurologist. He was thought to have had in the past Parkinson's disease but this was unfounded. I am not aware of any specific treatments for this. He'll need followup with his neurologist as an outpatient if this indeed is a progressive state where in he becomes more debilitated. I touched on this with both of the patient's children his son at bedside, and his daughter on telephone Active Problems:   HYPOTHYROIDISM-monitor TSH as an outpatient in 2 weeks once patient is better   Gout, unspecified-currently quiescent   ALZHEIMERS DISEASE-continue Namenda 10 mg daily.   BRADYCARDIA-currently stable has had numerous episodes of syncope in the last. Doesn't seem to be a specific issue   PULMONARY EMBOLISM, HX OF-used to be on Coumadin but then after falls this is transitioned to aspirin to 25   Hypertension-currently stable with high normal range is which are appropriate the patient about the age of 87-continue hydralazine 12.5 twice a day-would not escalate treatment given risk and propensity for falls   SIRS, ? Early- unknown etiology-differential  mainly concerning for urinary tract infection given his presentation with an indwelling Foley catheter, however he is also on Bactrim. Being 79 years old he may not be mounting an immune response. Given history of also difficulty with cough over the last day, repeat chest x-ray two-view in the a.m, Rx with Levaquin  which should cover most Urinary/CAP pathogens and potentially order speech eval if it is noted patient is coughing by nurse.  Code Status: Full code confirmed at bedside  Family Communication: Discussed with healthcare power of attorney and number above on telephone Lucia Gaskins. Please update her altered please update son at bedside if needed   Disposition Plan: Inpatient meds surgical bed 3 days   Time spent: 80 minutes   Mahala Menghini [facility] Pager [phone]  If 7PM-7AM, please contact night-coverage [email or web address] Password [facility] 05/15/2013, 2:06 PM

## 2013-05-16 ENCOUNTER — Inpatient Hospital Stay (HOSPITAL_COMMUNITY): Payer: Medicare HMO

## 2013-05-16 DIAGNOSIS — N390 Urinary tract infection, site not specified: Secondary | ICD-10-CM

## 2013-05-16 DIAGNOSIS — N400 Enlarged prostate without lower urinary tract symptoms: Secondary | ICD-10-CM

## 2013-05-16 DIAGNOSIS — G309 Alzheimer's disease, unspecified: Secondary | ICD-10-CM

## 2013-05-16 LAB — CBC
HCT: 34.1 % — ABNORMAL LOW (ref 39.0–52.0)
Hemoglobin: 11.1 g/dL — ABNORMAL LOW (ref 13.0–17.0)
MCHC: 32.6 g/dL (ref 30.0–36.0)
MCV: 86.8 fL (ref 78.0–100.0)
RDW: 13.5 % (ref 11.5–15.5)

## 2013-05-16 LAB — COMPREHENSIVE METABOLIC PANEL
ALT: 12 U/L (ref 0–53)
AST: 16 U/L (ref 0–37)
Albumin: 3.2 g/dL — ABNORMAL LOW (ref 3.5–5.2)
Alkaline Phosphatase: 73 U/L (ref 39–117)
Calcium: 8.6 mg/dL (ref 8.4–10.5)
Potassium: 3.9 mEq/L (ref 3.5–5.1)
Sodium: 137 mEq/L (ref 135–145)
Total Protein: 7.1 g/dL (ref 6.0–8.3)

## 2013-05-16 NOTE — Progress Notes (Signed)
UR COMPLETED  

## 2013-05-16 NOTE — Progress Notes (Signed)
[facility] PROGRESS NOTE  Ricky [name] XBJ:[id number] DOB: 17-Apr-1917 DOA: 05/15/2013 PCP: Bufford Spikes, DO  Assessment/Plan: Toxic encephalopathy, ? Infectious -exact etiology unclear. Please call [facility] in the morning to get results from urine culture performed 05/11/2013. -Catheter has been discontinued in emergency room and a new one placed and use sample of urine analysis and culture have been taken. -Cont relatively narrow spectrum levofloxacin for now as this has both relatively moderate lung, urine coverage. -Mental status at baseline is somewhat fluctuant and patient does have history of progressive supranuclear palsy however this is typically how he presents with infections  Progressive supranuclear palsy -follows with a new neurologist. He was thought to have had in the past Parkinson's disease but this was unfounded.  -followup with his neurologist as an outpatient HYPOTHYROIDISM- -monitor TSH as an outpatient in 2 weeks once patient is better  Gout, unspecified- -Stable  ALZHEIMERS DISEASE -continue Namenda 10 mg daily.  BRADYCARDIA -currently stable PULMONARY EMBOLISM, HX OF -used to be on Coumadin but then after falls this is transitioned to aspirin to 325  Hypertension -currently stable with high normal range is which are appropriate the patient about the age of 75 -continue hydralazine 12.5 twice a day-would not escalate treatment given risk and propensity for falls  SIRS, ? Early- unknown etiology -differential mainly concerning for urinary tract infection given his presentation with an indwelling Foley catheter -repeat chest x-ray this AM unremarkable   Code Status: Full Family Communication:  Pt in room (indicate person spoken with, relationship, and if by phone, the number) Disposition Plan: Pending  Antibiotics:  Levaquin 05/15/13>>>  Ciprofloxacin 05/15/13>>>  HPI/Subjective: Pt is without complaints  Objective: Filed Vitals:   05/15/13 1900 05/15/13 2018 05/15/13 2110 05/16/13 0559  BP:   153/71 160/79  Pulse:   76   Temp: 99 F (37.2 C) 99.6 F (37.6 C) 99.4 F (37.4 C)   TempSrc:  Oral Oral   Resp:   22   Height:      Weight:      SpO2:   95%     Intake/Output Summary (Last 24 hours) at 05/16/13 1309 Last data filed at 05/16/13 0600  Gross per 24 hour  Intake  957.5 ml  Output   1650 ml  Net -692.5 ml   Filed Weights   05/15/13 1025  Weight: 81.647 kg (180 lb)    Exam:   General:  Awake, in nad  Cardiovascular: regular, s1, s2  Respiratory: normal resp effort, no wheezing  Abdomen: soft, nondistended  Musculoskeletal: perfused no clubbing   Data Reviewed: Basic Metabolic Panel:  Recent Labs Lab 05/15/13 1150 05/15/13 1743 05/16/13 0527  NA 137  --  137  K 4.3  --  3.9  CL 100  --  102  CO2 26  --  25  GLUCOSE 87  --  92  BUN 9  --  9  CREATININE 1.13 1.00 1.18  CALCIUM 9.4  --  8.6   Liver Function Tests:  Recent Labs Lab 05/15/13 1150 05/16/13 0527  AST 23 16  ALT 17 12  ALKPHOS 87 73  BILITOT 0.5 0.6  PROT 8.1 7.1  ALBUMIN 3.8 3.2*   No results found for this basename: LIPASE, AMYLASE,  in the last 168 hours No results found for this basename: AMMONIA,  in the last 168 hours CBC:  Recent Labs Lab 05/15/13 1150 05/15/13 1743 05/16/13 0527  WBC 7.8 7.2 5.6  NEUTROABS 5.8  --   --  HGB 12.1* 12.1* 11.1*  HCT 36.8* 36.3* 34.1*  MCV 86.6 85.8 86.8  PLT 159 147* 143*   Cardiac Enzymes:  Recent Labs Lab 05/15/13 1150  TROPONINI <0.30   BNP (last 3 results)  Recent Labs  07/01/12 1632  PROBNP 118.0*   CBG: No results found for this basename: GLUCAP,  in the last 168 hours  Recent Results (from the past 240 hour(s))  MRSA PCR SCREENING     Status: None   Collection Time    05/15/13  6:17 PM      Result Value Range Status   MRSA by PCR NEGATIVE  NEGATIVE Final   Comment:            The GeneXpert MRSA Assay (FDA     approved for NASAL  specimens     only), is one component of a     comprehensive MRSA colonization     surveillance program. It is not     intended to diagnose MRSA     infection nor to guide or     monitor treatment for     MRSA infections.     Studies: Dg Chest 1 View  05/16/2013   *RADIOLOGY REPORT*  Clinical Data:  Shortness of breath, cough, contractures, question aspiration pneumonia  CHEST - 1 VIEW  Comparison: Portable exam 0745 hours compared to a 05/15/2013  Findings: Enlargement of cardiac silhouette. Mediastinal contours and pulmonary vascularity normal. Patient's head/chin obscures lung apices. Atherosclerotic calcification aortic arch. Bibasilar atelectasis. No gross infiltrate, pleural effusion or pneumothorax. Bones appear demineralized.  IMPRESSION: Enlargement of cardiac silhouette. Bibasilar atelectasis without gross infiltrate.   Original Report Authenticated By: Ulyses Southward, M.D.   Dg Chest 2 View  05/15/2013   *RADIOLOGY REPORT*  Clinical Data: Altered mental status.  CHEST - 2 VIEW  Comparison: 02/13/2013 and 02/11/2013.  Findings: There is stable cardiomegaly and aortic atherosclerosis. There is mild atelectasis superimposed on chronic lung disease.  No confluent airspace opacity or significant pleural effusion is present.  The bones are demineralized but appear stable.  IMPRESSION: Mild bibasilar atelectasis superimposed on chronic lung disease. No acute findings demonstrated.   Original Report Authenticated By: Carey Bullocks, M.D.   Ct Head Wo Contrast  05/15/2013   *RADIOLOGY REPORT*  Clinical Data: Altered mental status  CT HEAD WITHOUT CONTRAST  Technique:  Contiguous axial images were obtained from the base of the skull through the vertex without contrast.  Comparison: 02/02/2013  Findings: Kyphosis and suboptimal positioning limits the exam.  Chronic ischemic changes in the periventricular white matter and global atrophy are stable.  No mass effect, midline shift, or acute intracranial  hemorrhage.  The cranium is intact without obvious fracture.  Partial opacification of the sphenoid sinus is again noted.  IMPRESSION: Chronic ischemic changes and atrophy.  Stable.   Original Report Authenticated By: Jolaine Click, M.D.    Scheduled Meds: . acetaminophen  500-1,000 mg Oral BID  . aspirin EC  325 mg Oral Daily  . ferrous sulfate  325 mg Oral QODAY  . heparin  5,000 Units Subcutaneous Q8H  . hydrALAZINE  12.5 mg Oral BID  . lactose free nutrition  0.5 Container Oral BID BM  . levofloxacin (LEVAQUIN) IV  750 mg Intravenous Q48H  . levothyroxine  25 mcg Oral QAC breakfast  . memantine  10 mg Oral BID  . sodium chloride  3 mL Intravenous Q12H   Continuous Infusions: . sodium chloride 50 mL/hr at 05/16/13 0600  . sodium  chloride      Principal Problem:   Toxic encephalopathy, ? INfectious Active Problems:   HYPOTHYROIDISM   Gout, unspecified   ALZHEIMERS DISEASE   BRADYCARDIA   PULMONARY EMBOLISM, HX OF   Hypertension   SIRS, ? Early- unknonw etiology    Time spent:    Niki Payment K  [facility] Pager [phone]. If 7PM-7AM, please contact night-coverage at [email or web address], password [facility] 05/16/2013, 1:09 PM  LOS: 1 day

## 2013-05-17 DIAGNOSIS — I6350 Cerebral infarction due to unspecified occlusion or stenosis of unspecified cerebral artery: Secondary | ICD-10-CM

## 2013-05-17 LAB — URINE CULTURE
Colony Count: >=100000
Colony Count: 50000

## 2013-05-17 MED ORDER — SULFAMETHOXAZOLE-TMP DS 800-160 MG PO TABS
1.0000 | ORAL_TABLET | Freq: Two times a day (BID) | ORAL | Status: DC
  Administered 2013-05-17 – 2013-05-18 (×4): 1 via ORAL
  Filled 2013-05-17 (×6): qty 1

## 2013-05-17 NOTE — Progress Notes (Signed)
[facility] PROGRESS NOTE  Ricky [name] BJY:[id number] DOB: July 15, 1917 DOA: 05/15/2013 PCP: Bufford Spikes, DO  Assessment/Plan: Toxic encephalopathy, ? Infectious  -exact etiology unclear. Called Dr. Madilyn Hook office. Urine cx pos for Serratia that is mulit-drug sensitive, including levaquin. Requested faxed copy of results. -Catheter has been discontinued in emergency room and a new one placed and use sample of urine analysis and repeat culture have been taken.  -Cont relatively levofloxacin for now given culture result -Mental status at baseline is somewhat fluctuant and patient does have history of progressive supranuclear palsy however this is typically how he presents with infections  Progressive supranuclear palsy  -follows with a new neurologist. He was thought to have had in the past Parkinson's disease but this was unfounded.  -followup with his neurologist as an outpatient  HYPOTHYROIDISM-  -monitor TSH as an outpatient in 2 weeks once patient is better  Gout, unspecified-  -Stable  ALZHEIMERS DISEASE  -continue Namenda 10 mg daily.  BRADYCARDIA  -currently stable  PULMONARY EMBOLISM, HX OF  -used to be on Coumadin but then after falls this is transitioned to aspirin to 325  Hypertension  -currently stable with high normal range is which are appropriate the patient about the age of 58  -continue hydralazine 12.5 twice a day-would not escalate treatment given risk and propensity for falls  SIRS, ? Early- unknown etiology  -differential mainly concerning for urinary tract infection given his presentation with an indwelling Foley catheter  -repeat chest x-ray this AM unremarkable  Code Status: Full Family Communication: Pt in room (indicate person spoken with, relationship, and if by phone, the number) Disposition Plan: Pending  Antibiotics: Levaquin 05/15/13>>>  Ciprofloxacin 05/15/13>>>   HPI/Subjective: Reports feeling better  Objective: Filed Vitals:   05/16/13 0559 05/16/13 1430 05/16/13 2121 05/17/13 0600  BP: 160/79 149/80 151/92 164/93  Pulse: 65 72 73 60  Temp: 98.9 F (37.2 C) 97 F (36.1 C) 98.4 F (36.9 C) 98.3 F (36.8 C)  TempSrc: Oral     Resp: 20 18 18 18   Height:      Weight:      SpO2: 97% 98% 97% 97%    Intake/Output Summary (Last 24 hours) at 05/17/13 1032 Last data filed at 05/17/13 0440  Gross per 24 hour  Intake 1733.33 ml  Output   3000 ml  Net -1266.67 ml   Filed Weights   05/15/13 1025  Weight: 81.647 kg (180 lb)    Exam:   General:  Awake, in nad  Cardiovascular: regular, s1, s2  Respiratory: normal resp effort, no wheezing  Abdomen: soft, nondistended  Musculoskeletal: perfused, no clubbing   Data Reviewed: Basic Metabolic Panel:  Recent Labs Lab 05/15/13 1150 05/15/13 1743 05/16/13 0527  NA 137  --  137  K 4.3  --  3.9  CL 100  --  102  CO2 26  --  25  GLUCOSE 87  --  92  BUN 9  --  9  CREATININE 1.13 1.00 1.18  CALCIUM 9.4  --  8.6   Liver Function Tests:  Recent Labs Lab 05/15/13 1150 05/16/13 0527  AST 23 16  ALT 17 12  ALKPHOS 87 73  BILITOT 0.5 0.6  PROT 8.1 7.1  ALBUMIN 3.8 3.2*   No results found for this basename: LIPASE, AMYLASE,  in the last 168 hours No results found for this basename: AMMONIA,  in the last 168 hours CBC:  Recent Labs Lab 05/15/13 1150 05/15/13 1743 05/16/13 0527  WBC 7.8 7.2 5.6  NEUTROABS 5.8  --   --   HGB 12.1* 12.1* 11.1*  HCT 36.8* 36.3* 34.1*  MCV 86.6 85.8 86.8  PLT 159 147* 143*   Cardiac Enzymes:  Recent Labs Lab 05/15/13 1150  TROPONINI <0.30   BNP (last 3 results)  Recent Labs  07/01/12 1632  PROBNP 118.0*   CBG: No results found for this basename: GLUCAP,  in the last 168 hours  Recent Results (from the past 240 hour(s))  URINE CULTURE     Status: None   Collection Time    05/15/13 11:33 AM      Result Value Range Status   Specimen Description URINE, RANDOM   Final   Special Requests NONE    Final   Culture  Setup Time 05/15/2013 18:52   Final   Colony Count >=100,000 COLONIES/ML   Final   Culture     Final   Value: Multiple bacterial morphotypes present, none predominant. Suggest appropriate recollection if clinically indicated.   Report Status 05/17/2013 FINAL   Final  URINE CULTURE     Status: None   Collection Time    05/15/13  2:43 PM      Result Value Range Status   Specimen Description URINE, CLEAN CATCH   Final   Special Requests NONE   Final   Culture  Setup Time 05/15/2013 21:02   Final   Colony Count 50,000 COLONIES/ML   Final   Culture     Final   Value: STAPHYLOCOCCUS AUREUS     Note: RIFAMPIN AND GENTAMICIN SHOULD NOT BE USED AS SINGLE DRUGS FOR TREATMENT OF STAPH INFECTIONS.   Report Status PENDING   Incomplete  MRSA PCR SCREENING     Status: None   Collection Time    05/15/13  6:17 PM      Result Value Range Status   MRSA by PCR NEGATIVE  NEGATIVE Final   Comment:            The GeneXpert MRSA Assay (FDA     approved for NASAL specimens     only), is one component of a     comprehensive MRSA colonization     surveillance program. It is not     intended to diagnose MRSA     infection nor to guide or     monitor treatment for     MRSA infections.     Studies: Dg Chest 1 View  05/16/2013   *RADIOLOGY REPORT*  Clinical Data:  Shortness of breath, cough, contractures, question aspiration pneumonia  CHEST - 1 VIEW  Comparison: Portable exam 0745 hours compared to a 05/15/2013  Findings: Enlargement of cardiac silhouette. Mediastinal contours and pulmonary vascularity normal. Patient's head/chin obscures lung apices. Atherosclerotic calcification aortic arch. Bibasilar atelectasis. No gross infiltrate, pleural effusion or pneumothorax. Bones appear demineralized.  IMPRESSION: Enlargement of cardiac silhouette. Bibasilar atelectasis without gross infiltrate.   Original Report Authenticated By: Ulyses Southward, M.D.   Dg Chest 2 View  05/15/2013   *RADIOLOGY  REPORT*  Clinical Data: Altered mental status.  CHEST - 2 VIEW  Comparison: 02/13/2013 and 02/11/2013.  Findings: There is stable cardiomegaly and aortic atherosclerosis. There is mild atelectasis superimposed on chronic lung disease.  No confluent airspace opacity or significant pleural effusion is present.  The bones are demineralized but appear stable.  IMPRESSION: Mild bibasilar atelectasis superimposed on chronic lung disease. No acute findings demonstrated.   Original Report Authenticated By: Carey Bullocks, M.D.   Ct  Head Wo Contrast  05/15/2013   *RADIOLOGY REPORT*  Clinical Data: Altered mental status  CT HEAD WITHOUT CONTRAST  Technique:  Contiguous axial images were obtained from the base of the skull through the vertex without contrast.  Comparison: 02/02/2013  Findings: Kyphosis and suboptimal positioning limits the exam.  Chronic ischemic changes in the periventricular white matter and global atrophy are stable.  No mass effect, midline shift, or acute intracranial hemorrhage.  The cranium is intact without obvious fracture.  Partial opacification of the sphenoid sinus is again noted.  IMPRESSION: Chronic ischemic changes and atrophy.  Stable.   Original Report Authenticated By: Jolaine Click, M.D.    Scheduled Meds: . acetaminophen  500-1,000 mg Oral BID  . aspirin EC  325 mg Oral Daily  . ferrous sulfate  325 mg Oral QODAY  . heparin  5,000 Units Subcutaneous Q8H  . hydrALAZINE  12.5 mg Oral BID  . lactose free nutrition  0.5 Container Oral BID BM  . levofloxacin (LEVAQUIN) IV  750 mg Intravenous Q48H  . levothyroxine  25 mcg Oral QAC breakfast  . memantine  10 mg Oral BID  . sodium chloride  3 mL Intravenous Q12H   Continuous Infusions: . sodium chloride 50 mL/hr at 05/16/13 2000  . sodium chloride      Principal Problem:   Toxic encephalopathy, ? INfectious Active Problems:   HYPOTHYROIDISM   Gout, unspecified   ALZHEIMERS DISEASE   BRADYCARDIA   PULMONARY EMBOLISM, HX  OF   Hypertension   SIRS, ? Early- unknonw etiology    Time spent:    Anil Havard K  [facility] Pager [phone]. If 7PM-7AM, please contact night-coverage at [email or web address], password [facility] 05/17/2013, 10:32 AM  LOS: 2 days

## 2013-05-18 LAB — COMPREHENSIVE METABOLIC PANEL
BUN: 12 mg/dL (ref 6–23)
CO2: 21 mEq/L (ref 19–32)
Calcium: 8.7 mg/dL (ref 8.4–10.5)
Creatinine, Ser: 0.86 mg/dL (ref 0.50–1.35)
GFR calc Af Amer: 84 mL/min — ABNORMAL LOW (ref >90)
GFR calc non Af Amer: 72 mL/min — ABNORMAL LOW (ref >90)
Glucose, Bld: 93 mg/dL (ref 70–99)

## 2013-05-18 LAB — URINALYSIS W MICROSCOPIC + REFLEX CULTURE
Nitrite: NEGATIVE
Specific Gravity, Urine: 1.018 (ref 1.005–1.030)
Urobilinogen, UA: 1 mg/dL (ref 0.0–1.0)

## 2013-05-18 LAB — CBC WITH DIFFERENTIAL/PLATELET
Eosinophils Relative: 3 % (ref 0–5)
HCT: 36.3 % — ABNORMAL LOW (ref 39.0–52.0)
Lymphocytes Relative: 45 % (ref 12–46)
Lymphs Abs: 2.2 10*3/uL (ref 0.7–4.0)
MCV: 85.8 fL (ref 78.0–100.0)
Monocytes Absolute: 0.4 10*3/uL (ref 0.1–1.0)
RDW: 13.3 % (ref 11.5–15.5)
WBC: 5 10*3/uL (ref 4.0–10.5)

## 2013-05-18 MED ORDER — MUPIROCIN 2 % EX OINT
1.0000 "application " | TOPICAL_OINTMENT | Freq: Two times a day (BID) | CUTANEOUS | Status: DC
  Administered 2013-05-18 – 2013-05-19 (×3): 1 via NASAL
  Filled 2013-05-18: qty 22

## 2013-05-18 MED ORDER — CHLORHEXIDINE GLUCONATE CLOTH 2 % EX PADS
6.0000 | MEDICATED_PAD | Freq: Every day | CUTANEOUS | Status: DC
  Administered 2013-05-18: 6 via TOPICAL

## 2013-05-18 MED ORDER — HYDRALAZINE HCL 25 MG PO TABS
25.0000 mg | ORAL_TABLET | Freq: Two times a day (BID) | ORAL | Status: DC
  Administered 2013-05-18 – 2013-05-19 (×2): 25 mg via ORAL
  Filled 2013-05-18 (×3): qty 1

## 2013-05-18 MED ORDER — SODIUM CHLORIDE 0.9 % IV SOLN: INTRAVENOUS | Status: DC

## 2013-05-18 NOTE — Evaluation (Signed)
Physical Therapy Evaluation Patient Details Name: Ricky [name] MRN: [id number] DOB: [birth year] Today's Date: 05/18/2013 Time: 4098-1191 PT Time Calculation (min): 34 min  PT Assessment / Plan / Recommendation History of Present Illness  Pt underwent a slow decline in mentation and ability to walk over last few days.  Diagnosed as toxic encephalopathy ( of unknown origin.  Clinical Impression  Pt admitted with toxic encephalopathy of unknown origin. Pt currently with functional limitations due to the deficits listed below (see PT Problem List).  Pt will benefit from skilled PT to increase their independence and safety with mobility.      PT Assessment  Patient needs continued PT services    Follow Up Recommendations  SNF    Does the patient have the potential to tolerate intense rehabilitation      Barriers to Discharge        Equipment Recommendations  None recommended by PT    Recommendations for Other Services     Frequency Min 2X/week    Precautions / Restrictions Precautions Precautions: Fall Required Braces or Orthoses:  (bil prafo positioning boots in bed)   Pertinent Vitals/Pain       Mobility  Bed Mobility Bed Mobility: Supine to Sit;Sitting - Scoot to Edge of Bed Supine to Sit: 1: +1 Total assist Sitting - Scoot to Edge of Bed: 1: +1 Total assist Details for Bed Mobility Assistance: little to no initiation or follow through  Transfers Transfers: Sit to Stand;Stand to Sit Sit to Stand: 2: Max assist;With upper extremity assist;From bed Stand to Sit: 2: Max assist;With upper extremity assist;To chair/3-in-1 Details for Transfer Assistance: v/tc's for hand placement and set up for transfer.  Lifting/steadying assist into RW Ambulation/Gait Ambulation/Gait Assistance: 2: Max assist Ambulation Distance (Feet): 4 Feet (with turn and back up to the chair) Assistive device: Rolling walker Ambulation/Gait Assistance Details: short, mildly ataxic looking steps  with solid lean posteriorly Gait Pattern: Step-through pattern;Decreased step length - right;Decreased step length - left Gait velocity: very slow Stairs: No Wheelchair Mobility Wheelchair Mobility: No    Exercises     PT Diagnosis: Difficulty walking;Generalized weakness  PT Problem List: Decreased strength;Decreased activity tolerance;Decreased balance;Decreased mobility;Decreased coordination;Decreased safety awareness;Decreased knowledge of use of DME PT Treatment Interventions: Gait training;Functional mobility training;Therapeutic activities;DME instruction;Balance training;Patient/family education     PT Goals(Current goals can be found in the care plan section) Acute Rehab PT Goals Patient Stated Goal: pt did not participate with goals PT Goal Formulation: Patient unable to participate in goal setting Time For Goal Achievement: 05/25/13 Potential to Achieve Goals: Fair  Visit Information  Last PT Received On: 05/18/13 Assistance Needed: +2 History of Present Illness: Pt underwent a slow decline in mentation and ability to walk over last few days.  Diagnosed as toxic encephalopathy ( of unknown origin.       Prior Functioning  Home Living Family/patient expects to be discharged to:: Private residence Living Arrangements: Children Available Help at Discharge: Family Type of Home: House Home Access: Ramped entrance Home Layout: One level Home Equipment: Environmental consultant - 2 wheels;Shower seat;Bedside commode;Wheelchair - manual Additional Comments: unable to obtain history or prior level of function from the patient and no caregiver available. Prior Function Level of Independence: Needs assistance Gait / Transfers Assistance Needed: pt reports son walks with him Communication Communication: HOH    Cognition  Cognition Arousal/Alertness: Awake/alert Behavior During Therapy: Flat affect Overall Cognitive Status: History of cognitive impairments - at baseline (but may be worse  than usual;  no caregiver available)    Extremity/Trunk Assessment Upper Extremity Assessment Upper Extremity Assessment: Difficult to assess due to impaired cognition (UE's held rigidly at times, but were useful assisting on RW) Lower Extremity Assessment Lower Extremity Assessment: Generalized weakness (bil weak at 3+ to 4- bil) Cervical / Trunk Assessment Cervical / Trunk Assessment: Kyphotic   Balance Balance Balance Assessed: No  End of Session PT - End of Session Activity Tolerance: Patient tolerated treatment well Patient left: in chair;with call bell/phone within reach Nurse Communication: Mobility status  GP     Yashira Offenberger, Eliseo Gum 05/18/2013, 11:52 AM 05/18/2013  [place] Bing, PT [phone]  (pager)

## 2013-05-18 NOTE — Progress Notes (Addendum)
Pt is receiving Heparin for DVT prophylaxis. Air mattress overlay ordered per MD order. Pt wearing bil prafos BLE. No skin breakdown noted of heels or buttocks. RLE circled area-no reddness or warmth noted

## 2013-05-18 NOTE — Clinical Social Work Psychosocial (Addendum)
Clinical Social Work Department  BRIEF PSYCHOSOCIAL ASSESSMENT  Patient: Ricky [name] Account Number: [id number] Admit date:  Clinical Social Worker Sabino Niemann, MSW Date/Time: 05/18/2013 Referred by: Physician Date Referred:  Referred for   SNF Placement   Other Referral:  Interview type: Patient's son and daughter- Patient has Dementia and could not answer questions Other interview type: PSYCHOSOCIAL DATA  Living Status: Family Admitted from facility:  Level of care:  Primary support name: Ricky [name] Primary support relationship to patient: daughter Degree of support available:  Strong and vested  CURRENT CONCERNS  Current Concerns   Post-Acute Placement   Other Concerns:  SOCIAL WORK ASSESSMENT / PLAN  CSW met with pt but patient was unable to talk with SW. SW contacted Patient's son and daughter (MPOA)  re: PT recommendation for SNF.   Pt lives with Family  CSW explained placement process and answered questions.   Family would like to deny SNF and would like  To take the patient home. They feel they can care for the patient better at home      Assessment/plan status: Information/Referral to [facility]  Other assessment/ plan:  Information/referral to community resources:  SNF     PATIENT'S/FAMILY'S RESPONSE TO PLAN OF CARE:   Pt's family verbalized understanding of placement process and appreciation for CSW assist. However, at this time they would like to take the patient home and care for him there.  Clinical Social Worker will sign off for now as social work intervention is no longer needed. Please consult [place] again if new need arises.   Sabino Niemann, MSW [phone]

## 2013-05-18 NOTE — Progress Notes (Signed)
[facility]                                                                                Patient Demographics  Ricky [name], is a 79 y.o. male, DOB - 1916/12/01, ZOX:[id number], WJX:[id number]  Admit date - 05/15/2013  Admitting Physician Rhetta Mura, MD  Outpatient Primary MD for the patient is REED, TIFFANY, DO  LOS - 3   Chief Complaint  Patient presents with  . Altered Mental Status        Assessment & Plan    1. 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 In a patient who is 79 years old, with generalized weakness, walks with 1% assist and walker at home.  Agree with Bactrim looking at his cultures, he is afebrile with no leukocytosis, mental status appears to have improved as he followed basic commands and answer questions, this could be his baseline, will monitor him for 24 hours if stable will discharge home.     2. Progressive supranuclear patency. He follows closely with neurologist as outpatient which she will continue to do post discharge. No acute issues. Head CT unremarkable for acute changes.    3. Hypothyroidism. Stable continue home dose Synthroid. Check TSH as an outpatient and treat 4 weeks.    4. Alzheimer's dementia. Continue home dose Namenda, patient will remain at risk for delirium this has been explained to patient son.    5. History of PE and bradycardia. Currently hemodynamically stable, currently not on anticoagulation and on full dose aspirin which will be continued.    6. Hypertension. Continue hydralazine at present dose.     7. SIRs upon admission due to UTI. Resolved, chest x-ray unremarkable, afebrile, no leukocytosis, no tachycardia.     Code Status: Full  Family Communication: Son over the phone.  Disposition Plan: Home   Procedures CT head no acute change, chest x-ray no acute change   Consults      DVT Prophylaxis   Heparin    Lab Results  Component Value Date   PLT 167 05/18/2013    Medications  Scheduled Meds: . acetaminophen  500-1,000 mg Oral BID  . aspirin EC  325 mg Oral Daily  . Chlorhexidine Gluconate Cloth  6 each Topical Q0600  . ferrous sulfate  325 mg Oral QODAY  . heparin  5,000 Units Subcutaneous Q8H  . hydrALAZINE  12.5 mg Oral BID  . lactose free nutrition  0.5 Container Oral BID BM  . levothyroxine  25 mcg Oral QAC breakfast  . memantine  10 mg Oral BID  . mupirocin ointment  1 application Nasal BID  . sodium chloride  3 mL Intravenous Q12H  . sulfamethoxazole-trimethoprim  1 tablet Oral Q12H   Continuous Infusions: . sodium chloride 50 mL/hr at 05/17/13 1148  . sodium chloride 50 mL/hr at 05/18/13 0720   PRN Meds:.guaiFENesin  Antibiotics     Anti-infectives   Start     Dose/Rate Route Frequency Ordered Stop   05/17/13 1430  sulfamethoxazole-trimethoprim (BACTRIM DS) 800-160 MG per tablet 1 tablet     1 tablet Oral Every 12 hours 05/17/13 1358 05/24/13  1610   05/16/13 0800  levofloxacin (LEVAQUIN) IVPB 750 mg  Status:  Discontinued     750 mg 100 mL/hr over 90 Minutes Intravenous Every 48 hours 05/15/13 1736 05/17/13 1358   05/15/13 1415  ciprofloxacin (CIPRO) IVPB 400 mg     400 mg 200 mL/hr over 60 Minutes Intravenous  Once 05/15/13 1412 05/15/13 1656       Time Spent in minutes   35   SINGH,PRASHANT K M.D on 05/18/2013 at 10:04 AM  Between 7am to 7pm - Pager - [phone]  After 7pm go to [email or web address] - password [email or web address]  And look for the night coverage person covering for me after hours  [facility] Office  [phone]    Subjective:   Ricky [name] today has, No headache, No chest pain, No abdominal pain - No Nausea, No new weakness tingling or numbness, No Cough - SOB. Unreliable historian  Objective:   Filed Vitals:   05/17/13 0600 05/17/13 1409 05/17/13 2019 05/18/13 0522  BP: 164/93 171/80 169/88 146/76  Pulse: 60 67 72 80   Temp: 98.3 F (36.8 C) 98.1 F (36.7 C) 98.9 F (37.2 C) 98.5 F (36.9 C)  TempSrc:   Oral Oral  Resp: 18 18 16 16   Height:      Weight:      SpO2: 97% 96% 97% 96%    Wt Readings from Last 3 Encounters:  05/15/13 81.647 kg (180 lb)  05/02/13 80.74 kg (178 lb)  03/07/13 80.287 kg (177 lb)     Intake/Output Summary (Last 24 hours) at 05/18/13 1004 Last data filed at 05/18/13 0643  Gross per 24 hour  Intake 1275.83 ml  Output    700 ml  Net 575.83 ml    Exam Awake , Oriented X 0, No new F.N deficits, Normal affect [place].AT,PERRAL Supple Neck,No JVD, No cervical lymphadenopathy appriciated.  Symmetrical Chest wall movement, Good air movement bilaterally, CTAB RRR,No Gallops,Rubs or new Murmurs, No Parasternal Heave +ve B.Sounds, Abd Soft, Non tender, No organomegaly appriciated, No rebound - guarding or rigidity. No Cyanosis, Clubbing or edema, No new Rash or bruise      Data Review   Micro Results Recent Results (from the past 240 hour(s))  URINE CULTURE     Status: None   Collection Time    05/15/13 11:33 AM      Result Value Range Status   Specimen Description URINE, RANDOM   Final   Special Requests NONE   Final   Culture  Setup Time 05/15/2013 18:52   Final   Colony Count >=100,000 COLONIES/ML   Final   Culture     Final   Value: Multiple bacterial morphotypes present, none predominant. Suggest appropriate recollection if clinically indicated.   Report Status 05/17/2013 FINAL   Final  URINE CULTURE     Status: None   Collection Time    05/15/13  2:43 PM      Result Value Range Status   Specimen Description URINE, CLEAN CATCH   Final   Special Requests NONE   Final   Culture  Setup Time 05/15/2013 21:02   Final   Colony Count 50,000 COLONIES/ML   Final   Culture     Final   Value: METHICILLIN RESISTANT STAPHYLOCOCCUS AUREUS     Note: RIFAMPIN AND GENTAMICIN SHOULD NOT BE USED AS SINGLE DRUGS FOR TREATMENT OF STAPH INFECTIONS. CRITICAL RESULT CALLED TO,  READ BACK BY AND VERIFIED WITH: ED K. @1 :40PM ON 7.1 BY BUONO  Report Status 05/17/2013 FINAL   Final   Organism ID, Bacteria METHICILLIN RESISTANT STAPHYLOCOCCUS AUREUS   Final  MRSA PCR SCREENING     Status: None   Collection Time    05/15/13  6:17 PM      Result Value Range Status   MRSA by PCR NEGATIVE  NEGATIVE Final   Comment:            The GeneXpert MRSA Assay (FDA     approved for NASAL specimens     only), is one component of a     comprehensive MRSA colonization     surveillance program. It is not     intended to diagnose MRSA     infection nor to guide or     monitor treatment for     MRSA infections.    Radiology Reports Dg Chest 1 View  05/16/2013   *RADIOLOGY REPORT*  Clinical Data:  Shortness of breath, cough, contractures, question aspiration pneumonia  CHEST - 1 VIEW  Comparison: Portable exam 0745 hours compared to a 05/15/2013  Findings: Enlargement of cardiac silhouette. Mediastinal contours and pulmonary vascularity normal. Patient's head/chin obscures lung apices. Atherosclerotic calcification aortic arch. Bibasilar atelectasis. No gross infiltrate, pleural effusion or pneumothorax. Bones appear demineralized.  IMPRESSION: Enlargement of cardiac silhouette. Bibasilar atelectasis without gross infiltrate.   Original Report Authenticated By: Ulyses Southward, M.D.   Dg Chest 2 View  05/15/2013   *RADIOLOGY REPORT*  Clinical Data: Altered mental status.  CHEST - 2 VIEW  Comparison: 02/13/2013 and 02/11/2013.  Findings: There is stable cardiomegaly and aortic atherosclerosis. There is mild atelectasis superimposed on chronic lung disease.  No confluent airspace opacity or significant pleural effusion is present.  The bones are demineralized but appear stable.  IMPRESSION: Mild bibasilar atelectasis superimposed on chronic lung disease. No acute findings demonstrated.   Original Report Authenticated By: Carey Bullocks, M.D.   Ct Head Wo Contrast  05/15/2013   *RADIOLOGY  REPORT*  Clinical Data: Altered mental status  CT HEAD WITHOUT CONTRAST  Technique:  Contiguous axial images were obtained from the base of the skull through the vertex without contrast.  Comparison: 02/02/2013  Findings: Kyphosis and suboptimal positioning limits the exam.  Chronic ischemic changes in the periventricular white matter and global atrophy are stable.  No mass effect, midline shift, or acute intracranial hemorrhage.  The cranium is intact without obvious fracture.  Partial opacification of the sphenoid sinus is again noted.  IMPRESSION: Chronic ischemic changes and atrophy.  Stable.   Original Report Authenticated By: Jolaine Click, M.D.    [facility]  Recent Labs Lab 05/15/13 1150 05/15/13 1743 05/16/13 0527 05/18/13 0500  WBC 7.8 7.2 5.6 5.0  HGB 12.1* 12.1* 11.1* 11.8*  HCT 36.8* 36.3* 34.1* 36.3*  PLT 159 147* 143* 167  MCV 86.6 85.8 86.8 85.8  MCH 28.5 28.6 28.2 27.9  MCHC 32.9 33.3 32.6 32.5  RDW 13.4 13.4 13.5 13.3  LYMPHSABS 1.3  --   --  2.2  MONOABS 0.7  --   --  0.4  EOSABS 0.0  --   --  0.2  BASOSABS 0.0  --   --  0.0    Chemistries   Recent Labs Lab 05/15/13 1150 05/15/13 1743 05/16/13 0527 05/18/13 0500  NA 137  --  137 138  K 4.3  --  3.9 3.8  CL 100  --  102 105  CO2 26  --  25 21  GLUCOSE 87  --  92 93  BUN 9  --  9 12  CREATININE 1.13 1.00 1.18 0.86  CALCIUM 9.4  --  8.6 8.7  AST 23  --  16 22  ALT 17  --  12 12  ALKPHOS 87  --  73 69  BILITOT 0.5  --  0.6 0.4   ------------------------------------------------------------------------------------------------------------------ estimated creatinine clearance is 55.4 ml/min (by C-G formula based on Cr of 0.86). ------------------------------------------------------------------------------------------------------------------ No results found for this basename: HGBA1C,  in the last 72  hours ------------------------------------------------------------------------------------------------------------------ No results found for this basename: CHOL, HDL, LDLCALC, TRIG, CHOLHDL, LDLDIRECT,  in the last 72 hours ------------------------------------------------------------------------------------------------------------------ No results found for this basename: TSH, T4TOTAL, FREET3, T3FREE, THYROIDAB,  in the last 72 hours ------------------------------------------------------------------------------------------------------------------ No results found for this basename: VITAMINB12, FOLATE, FERRITIN, TIBC, IRON, RETICCTPCT,  in the last 72 hours  Coagulation profile No results found for this basename: INR, PROTIME,  in the last 168 hours  No results found for this basename: DDIMER,  in the last 72 hours  Cardiac Enzymes  Recent Labs Lab 05/15/13 1150  TROPONINI <0.30   ------------------------------------------------------------------------------------------------------------------ No components found with this basename: POCBNP,

## 2013-05-19 MED ORDER — SULFAMETHOXAZOLE-TRIMETHOPRIM 200-40 MG/5ML PO SUSP
20.0000 mL | Freq: Two times a day (BID) | ORAL | Status: DC
  Administered 2013-05-19: 20 mL via ORAL
  Filled 2013-05-19 (×2): qty 20

## 2013-05-19 MED ORDER — SULFAMETHOXAZOLE-TRIMETHOPRIM 800-160 MG PO TABS: 1.0000 | ORAL_TABLET | Freq: Two times a day (BID) | ORAL | Status: DC

## 2013-05-19 MED ORDER — ACETAMINOPHEN 500 MG PO TABS
500.0000 mg | ORAL_TABLET | Freq: Two times a day (BID) | ORAL | Status: DC
  Administered 2013-05-19: 500 mg via ORAL

## 2013-05-19 MED ORDER — SULFAMETHOXAZOLE-TRIMETHOPRIM 200-40 MG/5ML PO SUSP
20.0000 mL | Freq: Two times a day (BID) | ORAL | Status: DC
  Filled 2013-05-19 (×2): qty 20

## 2013-05-19 MED ORDER — ASPIRIN 325 MG PO TABS
325.0000 mg | ORAL_TABLET | Freq: Every day | ORAL | Status: DC
  Administered 2013-05-19: 325 mg via ORAL
  Filled 2013-05-19: qty 1

## 2013-05-19 NOTE — Discharge Summary (Addendum)
[facility]                                                                                   Ricky [name], is a 79 y.o. male  DOB 1917/03/23  MRN [id number].  Admission date:  05/15/2013  Discharge Date:  05/19/2013  Primary MD  Ricky Spikes, DO  Admitting Physician  Ricky Mura, MD  Admission Diagnosis  Altered mental status [780.97]  Discharge Diagnosis     Principal Problem:   Toxic encephalopathy, ? INfectious Active Problems:   HYPOTHYROIDISM   Gout, unspecified   ALZHEIMERS DISEASE   BRADYCARDIA   PULMONARY EMBOLISM, HX OF   Hypertension   SIRS, ? Early- unknonw etiology   Past Medical History  Diagnosis Date  . IBS (irritable bowel syndrome)   . Diverticulosis   . Cataract   . OAB (overactive bladder)   . Macular degeneration   . Alzheimer disease   . Hypertension   . Arthritis   . Osteoporosis   . Parkinsonian syndrome   . Syncope, vasovagal   . Neuromuscular disorder     parkisonian syndrome  . 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 Unspecified transient cerebral ischemia   . Unspecified urinary incontinence   . Senile dementia, uncomplicated   . Unspecified constipation   . Hypothyroidism   . Anemia, unspecified   . Depression   . Alzheimer's disease   . Gout, unspecified   . Thoracic or lumbosacral neuritis or radiculitis, unspecified   . Progressive supranuclear palsy   . Unspecified transient cerebral ischemia   . Unspecified urinary incontinence   . Syncope and collapse   . Paralysis agitans   . Unspecified transient cerebral ischemia   . Pneumonitis due to inhalation of food or vomitus   . Cellulitis and abscess of oral soft tissues   . Screening for lipoid disorders   . Senile dementia, uncomplicated   . Unspecified constipation   . Disorder of bone and cartilage, unspecified   . Other specified acquired hypothyroidism   . Unspecified hypothyroidism   . Anemia, unspecified   .  Depressive disorder, not elsewhere classified   . Alzheimer's disease   . Paralysis agitans   . Pneumonia, organism unspecified   . Pseudomonas infection in conditions classified elsewhere and of unspecified site   . Unspecified essential hypertension   . Urinary tract infection, site not specified   . Osteoarthrosis, unspecified whether generalized or localized, unspecified site   . Osteoporosis, unspecified   . Gout, unspecified   . Thoracic or lumbosacral neuritis or radiculitis, unspecified     Past Surgical History  Procedure Laterality Date  . Esophagogastroduodenoscopy    . Eye surgery  2006    LEFT CATARACT     Recommendations for primary care physician for things to follow:   Follow clinically for UTI   Discharge Diagnoses:   Principal Problem:   Toxic encephalopathy, ? INfectious Active Problems:   HYPOTHYROIDISM   Gout, unspecified   ALZHEIMERS DISEASE   BRADYCARDIA   PULMONARY EMBOLISM, HX OF   Hypertension   SIRS, ? Early- unknonw etiology    Discharge Condition: Stable, poor  long term prognosis due to advanced age and underlying comorbidities   Diet recommendation: See Discharge Instructions below   Consults physical therapy, social work    History of present illness and  Hospital Course:     Kindly see H&P for history of present illness and admission details, please review complete Labs, Consult reports and Test reports for all details in brief Ricky [name], is a 79 y.o. male, patient with history of Alzheimer's dementia, progressive supranuclear palsy, chronic indwelling catheter with recurrent UTIs, hypothyroidism, PE. Now not on Coumadin, was brought in by family members for toxic metabolic encephalopathy on top of his underlying dementia secondary to another episode of UTI in the setting of chronic indwelling catheter.    He had urine culture at his urologist's office showing Serratia which was Bactrim sensitive and he grew 50,000 colonies  of MRSA during his urine culture here. He was transitioned to oral Bactrim with good results , he is now close to baseline, he is alert, answers questions and follows commands he says he feels much better and he would like to be sent home today. I will request him to follow with his primary care physician and urologist in 3-4 days post discharge for routine followup. I have discussed his care with his daughter who lives out of town but his primary contact over her cell phone who agrees with the plan. She will convey this to her brother who lives locally.    Patient also has a diagnosis of progressive supranuclear palsy for which she will continue to follow with his neurologist as before.   For his hypothyroidism and Alzheimer's dementia he will continue his Synthroid and Namenda unchanged, we'll request PCP to check TSH in 4-6 weeks for routine followup.   He has remote history of PE for which he has finished his anticoagulation and currently he is on aspirin.   He also has history of hypertension and he will continue his hydralazine per home dose unchanged.     Today   Subjective:   Ricky [name] today has no headache,no chest abdominal pain,no new weakness tingling or numbness, feels much better wants to go home today.    Objective:   Blood pressure 143/86, pulse 60, temperature 98.3 F (36.8 C), temperature source Oral, resp. rate 14, height 5\' 10"  (1.778 m), weight 81.647 kg (180 lb), SpO2 96.00%.   Intake/Output Summary (Last 24 hours) at 05/19/13 1042 Last data filed at 05/19/13 0602  Gross per 24 hour  Intake   1340 ml  Output   1400 ml  Net    -60 ml    Exam Awake Alert, Oriented *3, No new F.N deficits, Normal affect [name].AT,PERRAL Supple Neck,No JVD, No cervical lymphadenopathy appriciated.  Symmetrical Chest wall movement, Good air movement bilaterally, CTAB RRR,No Gallops,Rubs or new Murmurs, No Parasternal Heave +ve B.Sounds, Abd Soft, Non tender, No organomegaly  appriciated, No rebound -guarding or rigidity. Chronic indwelling Foley catheter No Cyanosis, Clubbing or edema, No new Rash or bruise, chronic contractures wearing ankle splints in both feet  Data Review   Major procedures and Radiology Reports - PLEASE review detailed and final reports for all details in brief -        Dg Chest 1 View  05/16/2013   *RADIOLOGY REPORT*  Clinical Data:  Shortness of breath, cough, contractures, question aspiration pneumonia  CHEST - 1 VIEW  Comparison: Portable exam 0745 hours compared to a 05/15/2013  Findings: Enlargement of cardiac silhouette. Mediastinal contours and pulmonary  vascularity normal. Patient's head/chin obscures lung apices. Atherosclerotic calcification aortic arch. Bibasilar atelectasis. No gross infiltrate, pleural effusion or pneumothorax. Bones appear demineralized.  IMPRESSION: Enlargement of cardiac silhouette. Bibasilar atelectasis without gross infiltrate.   Original Report Authenticated By: Ulyses Southward, M.D.   Dg Chest 2 View  05/15/2013   *RADIOLOGY REPORT*  Clinical Data: Altered mental status.  CHEST - 2 VIEW  Comparison: 02/13/2013 and 02/11/2013.  Findings: There is stable cardiomegaly and aortic atherosclerosis. There is mild atelectasis superimposed on chronic lung disease.  No confluent airspace opacity or significant pleural effusion is present.  The bones are demineralized but appear stable.  IMPRESSION: Mild bibasilar atelectasis superimposed on chronic lung disease. No acute findings demonstrated.   Original Report Authenticated By: Carey Bullocks, M.D.   Ct Head Wo Contrast  05/15/2013   *RADIOLOGY REPORT*  Clinical Data: Altered mental status  CT HEAD WITHOUT CONTRAST  Technique:  Contiguous axial images were obtained from the base of the skull through the vertex without contrast.  Comparison: 02/02/2013  Findings: Kyphosis and suboptimal positioning limits the exam.  Chronic ischemic changes in the periventricular white matter  and global atrophy are stable.  No mass effect, midline shift, or acute intracranial hemorrhage.  The cranium is intact without obvious fracture.  Partial opacification of the sphenoid sinus is again noted.  IMPRESSION: Chronic ischemic changes and atrophy.  Stable.   Original Report Authenticated By: Jolaine Click, M.D.    Micro Results      Recent Results (from the past 240 hour(s))  URINE CULTURE     Status: None   Collection Time    05/15/13 11:33 AM      Result Value Range Status   Specimen Description URINE, RANDOM   Final   Special Requests NONE   Final   Culture  Setup Time 05/15/2013 18:52   Final   Colony Count >=100,000 COLONIES/ML   Final   Culture     Final   Value: Multiple bacterial morphotypes present, none predominant. Suggest appropriate recollection if clinically indicated.   Report Status 05/17/2013 FINAL   Final  URINE CULTURE     Status: None   Collection Time    05/15/13  2:43 PM      Result Value Range Status   Specimen Description URINE, CLEAN CATCH   Final   Special Requests NONE   Final   Culture  Setup Time 05/15/2013 21:02   Final   Colony Count 50,000 COLONIES/ML   Final   Culture     Final   Value: METHICILLIN RESISTANT STAPHYLOCOCCUS AUREUS     Note: RIFAMPIN AND GENTAMICIN SHOULD NOT BE USED AS SINGLE DRUGS FOR TREATMENT OF STAPH INFECTIONS. CRITICAL RESULT CALLED TO, READ BACK BY AND VERIFIED WITH: ED K. @1 :40PM ON 7.1 BY BUONO   Report Status 05/17/2013 FINAL   Final   Organism ID, Bacteria METHICILLIN RESISTANT STAPHYLOCOCCUS AUREUS   Final  MRSA PCR SCREENING     Status: None   Collection Time    05/15/13  6:17 PM      Result Value Range Status   MRSA by PCR NEGATIVE  NEGATIVE Final   Comment:            The GeneXpert MRSA Assay (FDA     approved for NASAL specimens     only), is one component of a     comprehensive MRSA colonization     surveillance program. It is not     intended to diagnose MRSA  infection nor to guide or      monitor treatment for     MRSA infections.     CBC w Diff: Lab Results  Component Value Date   WBC 5.0 05/18/2013   WBC 5.8 02/10/2013   HGB 11.8* 05/18/2013   HCT 36.3* 05/18/2013   PLT 167 05/18/2013   LYMPHOPCT 45 05/18/2013   MONOPCT 9 05/18/2013   EOSPCT 3 05/18/2013   BASOPCT 0 05/18/2013    CMP: Lab Results  Component Value Date   NA 138 05/18/2013   NA 141 02/10/2013   K 3.8 05/18/2013   CL 105 05/18/2013   CO2 21 05/18/2013   BUN 12 05/18/2013   BUN 12 02/10/2013   CREATININE 0.86 05/18/2013   PROT 7.4 05/18/2013   PROT 7.6 02/10/2013   ALBUMIN 3.0* 05/18/2013   BILITOT 0.4 05/18/2013   ALKPHOS 69 05/18/2013   AST 22 05/18/2013   ALT 12 05/18/2013  . Lab Results  Component Value Date   INR 1.18 01/27/2013   INR 1.20 06/20/2012   INR 1.37 11/16/2011     Discharge Instructions     Follow with Primary MD REED, TIFFANY, DO and in your urologist in 4 days   Get CBC, CMP, checked 4 days by Primary MD and again as instructed by your Primary MD.   Get Medicines reviewed and adjusted.  Please request your Prim.MD to go over all Hospital Tests and Procedure/Radiological results at the follow up, please get all Hospital records sent to your Prim MD by signing hospital release before you go home.  Activity: As tolerated with Full fall precautions use walker/cane & assistance as needed   Diet:  Heart healthy with assistance and aspiration precautions  For Heart failure patients - Check your Weight same time everyday, if you gain over 2 pounds, or you develop in leg swelling, experience more shortness of breath or chest pain, call your Primary MD immediately. Follow Cardiac Low Salt Diet and 1.8 lit/day fluid restriction.  Disposition Home   If you experience worsening of your admission symptoms, develop shortness of breath, life threatening emergency, suicidal or homicidal thoughts you must seek medical attention immediately by calling 911 or calling your MD immediately  if symptoms less  severe.   Please note  You were cared for by a hospitalist during your hospital stay. If you have any questions about your discharge medications or the care you received while you were in the hospital after you are discharged, you can call the unit and asked to speak with the hospitalist on call if the hospitalist that took care of you is not available. Once you are discharged, your primary care physician will handle any further medical issues. Please note that NO REFILLS for any discharge medications will be authorized once you are discharged, as it is imperative that you return to your primary care physician (or establish a relationship with a primary care physician if you do not have one) for your aftercare needs so that they can reassess your need for medications and monitor your lab values.        Follow-up Information   Follow up with REED, TIFFANY, DO. Schedule an appointment as soon as possible for a visit in 4 days.   Contact information:   [address] [place] [address] [phone]       Follow up with NESI,MARC-HENRY, MD. Schedule an appointment as soon as possible for a visit in 4 days.   Contact information:  [address] [place] [address] [phone]         Discharge Medications     Medication List         acetaminophen 500 MG tablet  Commonly known as:  TYLENOL  Take 500-1,000 mg by mouth 2 (two) times daily.     aspirin 325 MG EC tablet  Take 1 tablet (325 mg total) by mouth daily.     atorvastatin 10 MG tablet  Commonly known as:  LIPITOR  Take 1 tablet (10 mg total) by mouth daily at 6 PM.     bacitracin ointment  Apply topically 2 (two) times daily. Apply to penis     CALTRATE 600+D PLUS 600-800 MG-UNIT Chew  Chew 1 tablet by mouth 2 (two) times daily with a meal.     cetirizine 10 MG tablet  Commonly known as:  ZYRTEC  Take 10 mg by mouth daily.     Cranberry 500 MG Chew  Chew  4,200 mg by mouth 3 (three) times daily.     ferrous sulfate 325 (65 FE) MG tablet  Take 325 mg by mouth every other day.     guaiFENesin 600 MG 12 hr tablet  Commonly known as:  MUCINEX  Take 600 mg by mouth 2 (two) times daily as needed for congestion.     hydrALAZINE 25 MG tablet  Commonly known as:  APRESOLINE  Take 12.5 mg by mouth 2 (two) times daily.     ICAPS AREDS FORMULA PO  Take 2 tablets by mouth 2 (two) times daily.     lactose free nutrition Liqd  Take 0.5 Containers by mouth 2 (two) times daily between meals.     levothyroxine 25 MCG tablet  Commonly known as:  SYNTHROID, LEVOTHROID  Take 25 mcg by mouth daily.     MELATONIN EXTRA STRENGTH PO  Take 15 mLs by mouth at bedtime as needed (sleep). To help sleep     memantine 10 MG tablet  Commonly known as:  NAMENDA  Take 10 mg by mouth 2 (two) times daily.     multivitamin with minerals Tabs  Take 1 tablet by mouth daily.     polyethylene glycol packet  Commonly known as:  MIRALAX / GLYCOLAX  Take 17 g by mouth daily.     sulfamethoxazole-trimethoprim 800-160 MG per tablet  Commonly known as:  BACTRIM DS,SEPTRA DS  Take 1 tablet by mouth 2 (two) times daily. 7 day course. Started on May 12, 2013.     VITAMIN B-12 PO  Take 5,000 mcg by mouth daily.           Total Time in preparing paper work, data evaluation and todays exam - 35 minutes  Leroy Sea M.D on 05/19/2013 at 10:42 AM  [facility] Office  [phone]

## 2013-05-19 NOTE — Progress Notes (Signed)
Physical Therapy Treatment Patient Details Name: Ricky [name] MRN: [id number] DOB: Aug 03, 1917 Today's Date: 05/19/2013 Time: 8295-6213 PT Time Calculation (min): 27 min  PT Assessment / Plan / Recommendation  PT Comments   Pt admitted with toxic encephalopathy. Pt currently with functional limitations due to weakness and poor postural stability.   Pt will benefit from skilled PT to increase their independence and safety with mobility to allow discharge to the venue listed below.   Follow Up Recommendations  SNF                 Equipment Recommendations  None recommended by PT        Frequency Min 2X/week   Progress towards PT Goals Progress towards PT goals: Progressing toward goals  Plan Current plan remains appropriate    Precautions / Restrictions Precautions Precautions: Fall Required Braces or Orthoses: Other Brace/Splint (bil PRAFO) Restrictions Weight Bearing Restrictions: No   Pertinent Vitals/Pain VSS, no pain    Mobility  Bed Mobility Bed Mobility: Supine to Sit;Sitting - Scoot to Edge of Bed Supine to Sit: 1: +1 Total assist Sitting - Scoot to Edge of Bed: 1: +1 Total assist Details for Bed Mobility Assistance: little to no initiation or follow through  Transfers Transfers: Sit to Stand;Stand to Sit Sit to Stand: With upper extremity assist;From bed;1: +1 Total assist Stand to Sit: With upper extremity assist;To chair/3-in-1;1: +1 Total assist Details for Transfer Assistance: v/tc's for hand placement and set up for transfer.  Attempted multiple times without success for full upright posture.  Only able to clear pt's bottom off of bed.  Pt lacks ability to give enough effort and pt with rigid posture.  Tends to work against PT's assistance.   Ambulation/Gait Ambulation/Gait Assistance: Not tested (comment) Stairs: No Wheelchair Mobility Wheelchair Mobility: No    Exercises General Exercises - Lower Extremity Long Arc Quad: AAROM;Both;10  reps;Seated Heel Slides: AAROM;Both;10 reps;Supine Hip ABduction/ADduction: AAROM;Both;10 reps;Supine     PT Goals (current goals can now be found in the care plan section)    Visit Information  Last PT Received On: 05/19/13 Assistance Needed: +2 History of Present Illness: Pt underwent a slow decline in mentation and ability to walk over last few days.  Diagnosed as toxic encephalopathy ( of unknown origin.    Subjective Data  Subjective: "I want to try."   Cognition  Cognition Arousal/Alertness: Awake/alert Behavior During Therapy: Flat affect Overall Cognitive Status: History of cognitive impairments - at baseline (but may be worse than usual; no caregiver available)    Balance  Static Sitting Balance Static Sitting - Balance Support: Bilateral upper extremity supported;Feet supported Static Sitting - Level of Assistance: 5: Stand by assistance Static Sitting - Comment/# of Minutes: 3  End of Session PT - End of Session Equipment Utilized During Treatment: Gait belt Activity Tolerance: Patient tolerated treatment well Patient left: in bed;with call bell/phone within reach Nurse Communication: Mobility status;Need for lift equipment        INGOLD,Shaquasha Gerstel 05/19/2013, 12:40 PM [facility] [phone] (pager)

## 2013-05-19 NOTE — Care Management Note (Signed)
    Page 1 of 2   05/19/2013     1:49:23 PM   CARE MANAGEMENT NOTE 05/19/2013  Patient:  Ricky [name]   Account Number:  [id number]  Date Initiated:  05/19/2013  Documentation initiated by:  [facility]  Subjective/Objective Assessment:   admitted with toxic encephalopathy  had HHRN, PT and OT with [facility] prior to admission     Action/Plan:   plan return to home with HHPT, OT and RN   Anticipated DC Date:  05/19/2013   Anticipated DC Plan:  HOME W HOME HEALTH SERVICES      DC Planning Services  CM consult      [facility] Choice  Resumption Of Svcs/PTA Provider   Choice offered to / List presented to:          [facility] arranged  HH-1 RN  HH-2 PT  HH-3 OT      [facility] agency  [facility]   Status of service:  Completed, signed off Medicare Important Message given?   (If response is "NO", the following Medicare IM given date fields will be blank) Date Medicare IM given:   Date Additional Medicare IM given:    Discharge Disposition:  HOME W HOME HEALTH SERVICES  Per UR Regulation:    If discussed at Long Length of Stay Meetings, dates discussed:    Comments:  05/19/13 Spoke with patient, received permission to contact his son. Contacted patient's son Ricky [name], Ricky [name] lives with him and he would like to resume HHRN, PT and OT with [facility] ([place] Contacted Ricky [name], spoke with Ricky [name], faxed order for [facility], PT and OT and d/c summary to [phone], received confirmation. Ricky Cree RN, BSN, CCM

## 2013-05-20 LAB — URINE CULTURE
Colony Count: NO GROWTH
Culture: NO GROWTH

## 2013-05-25 ENCOUNTER — Encounter: Payer: Self-pay | Admitting: *Deleted

## 2013-05-26 ENCOUNTER — Encounter: Payer: Self-pay | Admitting: Internal Medicine

## 2013-05-26 ENCOUNTER — Ambulatory Visit (INDEPENDENT_AMBULATORY_CARE_PROVIDER_SITE_OTHER): Payer: Medicare HMO | Admitting: Internal Medicine

## 2013-05-26 VITALS — BP 126/84 | HR 51 | Temp 97.5°F | Resp 13 | Ht 70.0 in

## 2013-05-26 DIAGNOSIS — R339 Retention of urine, unspecified: Secondary | ICD-10-CM

## 2013-05-26 DIAGNOSIS — M7660 Achilles tendinitis, unspecified leg: Secondary | ICD-10-CM

## 2013-05-26 DIAGNOSIS — G231 Progressive supranuclear ophthalmoplegia [Steele-Richardson-Olszewski]: Secondary | ICD-10-CM

## 2013-05-26 DIAGNOSIS — G238 Other specified degenerative diseases of basal ganglia: Secondary | ICD-10-CM

## 2013-05-26 DIAGNOSIS — M7661 Achilles tendinitis, right leg: Secondary | ICD-10-CM

## 2013-05-26 DIAGNOSIS — N390 Urinary tract infection, site not specified: Secondary | ICD-10-CM

## 2013-05-26 DIAGNOSIS — R404 Transient alteration of awareness: Secondary | ICD-10-CM

## 2013-05-26 DIAGNOSIS — R410 Disorientation, unspecified: Secondary | ICD-10-CM

## 2013-05-26 NOTE — Patient Instructions (Signed)
Please apply heat compresses or ice to the ankle area on the left.  Also may use icy hot or similar over the counter agents.

## 2013-05-26 NOTE — Progress Notes (Signed)
Patient ID: Ricky [name], male   DOB: Dec 04, 1916, 79 y.o.   MRN: [id number] Location:  [facility] [place] [facility]  Code Status: full code  No Known Allergies  Chief Complaint  Patient presents with  . Hospitalization Follow-up    Hospital Follow up    HPI: Patient is a 79 y.o. AA male seen in the office today for hospital f/u.   On wed 6/25, saw foamy material in catheter.  He wanted to urinate despite fact that he has the catheter.  Said something felt unusual down there.  Seemed like wasn't coming out.  Behavior was fine.  Suddenly had a lot of urine output and felt ok.  Continued to have a lot of bubbles.  thurs am, felt uncomfortable again and had fever.  Normal for him is 96.6.  Was 97-98.  Tried to call Frances Furbish and she was unavailable.  Saw Dr. Brunilda Payor and had urine sample done.  Clean at first, then culture with low level of infection.  Gave a week of abx (bactrim DS bid x 7 days).  Ok fri and sat, then 6/29, sun, got lethargic, weak, had to be in wheelchair.  Legs crumple down.  Paramedics took to ED.  Had been coughing and wheezing some sat night.  Son believes URI with aspiration.  Used D-mannose.  Got IV abx at [facility].  7/1, was delirious and talking out of his head, repeating himself many times.  Dr. Thedore Mins said it was sundowning delirium and had fever whole time at hospital (relative).  Didn't eat two days when got home.  Was using tylenol.  Finishing 5 days of bactrim DS since Monday of this week.    Has right foot pain.  More in his lower leg and ankle.  Bothers when tries to walk.  Was on levaquin and cipro in the hospital.    Review of Systems:  Review of Systems  Constitutional: Positive for malaise/fatigue. Negative for fever and chills.  HENT: Negative for congestion.   Eyes: Negative for blurred vision.  Respiratory: Negative for cough and shortness of breath.   Cardiovascular: Negative for chest pain and leg swelling.  Gastrointestinal: Negative  for abdominal pain and constipation.  Genitourinary: Negative for dysuria, urgency and frequency.  Skin: Negative for rash.  Neurological: Positive for focal weakness and loss of consciousness. Negative for headaches.  Psychiatric/Behavioral: Positive for memory loss.     Past Medical History  Diagnosis Date  . IBS (irritable bowel syndrome)   . Diverticulosis   . Cataract   . OAB (overactive bladder)   . Macular degeneration   . Alzheimer disease   . Hypertension   . Arthritis   . Osteoporosis   . Parkinsonian syndrome   . Syncope, vasovagal   . Neuromuscular disorder     parkisonian syndrome  . 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 Unspecified transient cerebral ischemia   . Unspecified urinary incontinence   . Senile dementia, uncomplicated   . Unspecified constipation   . Hypothyroidism   . Anemia, unspecified   . Depression   . Alzheimer's disease   . Gout, unspecified   . Thoracic or lumbosacral neuritis or radiculitis, unspecified   . Progressive supranuclear palsy   . Unspecified transient cerebral ischemia   . Unspecified urinary incontinence   . Syncope and collapse   . Paralysis agitans   . Unspecified transient cerebral ischemia   . Pneumonitis due to inhalation of food or  vomitus   . Cellulitis and abscess of oral soft tissues   . Screening for lipoid disorders   . Senile dementia, uncomplicated   . Unspecified constipation   . Disorder of bone and cartilage, unspecified   . Other specified acquired hypothyroidism   . Unspecified hypothyroidism   . Anemia, unspecified   . Depressive disorder, not elsewhere classified   . Alzheimer's disease   . Paralysis agitans   . Pneumonia, organism unspecified   . Pseudomonas infection in conditions classified elsewhere and of unspecified site   . Unspecified essential hypertension   . Urinary tract infection, site not specified   . Osteoarthrosis, unspecified whether  generalized or localized, unspecified site   . Osteoporosis, unspecified   . Gout, unspecified   . Thoracic or lumbosacral neuritis or radiculitis, unspecified     Past Surgical History  Procedure Laterality Date  . Esophagogastroduodenoscopy    . Eye surgery  2006    LEFT CATARACT    Social History:   reports that he has never smoked. He has never used smokeless tobacco. He reports that he does not drink alcohol or use illicit drugs.  Family History  Problem Relation Age of Onset  . Cancer Mother   . Stroke Father   . Liver disease Father   . Kidney disease Brother   . Liver disease Brother   . Emphysema Brother     Medications: Patient's Medications  New Prescriptions   No medications on file  Previous Medications   ACETAMINOPHEN (TYLENOL) 500 MG TABLET    Take 500-1,000 mg by mouth 2 (two) times daily.    ASPIRIN EC 325 MG EC TABLET    Take 1 tablet (325 mg total) by mouth daily.   ATORVASTATIN (LIPITOR) 10 MG TABLET    Take 1 tablet (10 mg total) by mouth daily at 6 PM.   BACITRACIN OINTMENT    Apply topically 2 (two) times daily. Apply to penis   CALCIUM CARBONATE-VIT D-MIN (CALTRATE 600+D PLUS) 600-800 MG-UNIT CHEW    Chew 1 tablet by mouth 2 (two) times daily with a meal.    CETIRIZINE (ZYRTEC) 10 MG TABLET    Take 10 mg by mouth. As needed for allergies   CRANBERRY 500 MG CHEW    Chew 4,200 mg by mouth 3 (three) times daily.    CYANOCOBALAMIN (VITAMIN B-12 PO)    Take 5,000 mcg by mouth daily.   FERROUS SULFATE 325 (65 FE) MG TABLET    Take 325 mg by mouth every other day.    GUAIFENESIN (MUCINEX) 600 MG 12 HR TABLET    Take 600 mg by mouth 2 (two) times daily as needed for congestion.    HYDRALAZINE (APRESOLINE) 25 MG TABLET    Take 12.5 mg by mouth 2 (two) times daily.   LACTOSE FREE NUTRITION (BOOST) LIQD    Take 0.5 Containers by mouth 2 (two) times daily between meals.    LEVOTHYROXINE (SYNTHROID, LEVOTHROID) 25 MCG TABLET    Take 25 mcg by mouth daily.    MELATONIN EXTRA STRENGTH PO    Take 15 mLs by mouth at bedtime as needed (sleep). To help sleep   MEMANTINE (NAMENDA) 10 MG TABLET    Take 10 mg by mouth 2 (two) times daily.    MULTIPLE VITAMIN (MULTIVITAMIN WITH MINERALS) TABS    Take 1 tablet by mouth daily.   MULTIPLE VITAMINS-MINERALS (ICAPS AREDS FORMULA PO)    Take 2 tablets by mouth 2 (two) times daily.  NON FORMULARY    D Mannose Supplement  One teaspoon Five times daily for 2 days as needed when shows signs of hypoactive hysteria due to possible urinary tract infection   POLYETHYLENE GLYCOL (MIRALAX / GLYCOLAX) PACKET    Take 17 g by mouth daily.  Modified Medications   Modified Medication Previous Medication   SULFAMETHOXAZOLE-TRIMETHOPRIM (BACTRIM DS,SEPTRA DS) 800-160 MG PER TABLET sulfamethoxazole-trimethoprim (BACTRIM DS,SEPTRA DS) 800-160 MG per tablet      Take 1 tablet by mouth 2 (two) times daily. 5 day course. Started on May 23, 2013.    Take 1 tablet by mouth 2 (two) times daily. 7 day course. Started on May 12, 2013.  Discontinued Medications   No medications on file     Physical Exam: Filed Vitals:   05/26/13 1028  BP: 126/84  Pulse: 51  Temp: 97.5 F (36.4 C)  TempSrc: Oral  Resp: 13  Height: 5\' 10"  (1.778 m)  Physical Exam  Constitutional: No distress.  HENT:  Head: Normocephalic and atraumatic.  Cardiovascular: Normal rate, regular rhythm, normal heart sounds and intact distal pulses.   Pulmonary/Chest: Effort normal and breath sounds normal. No respiratory distress.  Abdominal: Soft. Bowel sounds are normal. He exhibits no distension and no mass. There is no tenderness.  Musculoskeletal: He exhibits tenderness. He exhibits no edema.  Right ankle tenderness over achilles tendon insertion site  Neurological: He is alert.  Easily aroused but sits with head down in wheelchair, kyphotic  Skin: Skin is warm and dry.     Labs reviewed: Basic Metabolic Panel:  Recent Labs  16/10/96 0335   01/27/13 1542  02/11/13 1722  05/15/13 1150 05/15/13 1743 05/16/13 0527 05/18/13 0500  NA 141  < >  --   < > 134*  < > 137  --  137 138  K 3.7  < >  --   < > 4.0  < > 4.3  --  3.9 3.8  CL 105  < >  --   < > 99  < > 100  --  102 105  CO2 23  < >  --   < > 26  < > 26  --  25 21  GLUCOSE 90  < >  --   < > 100*  < > 87  --  92 93  BUN 14  < >  --   < > 9  < > 9  --  9 12  CREATININE 0.87  < > 0.85  < > 0.81  < > 1.13 1.00 1.18 0.86  CALCIUM 9.3  < >  --   < > 9.1  < > 9.4  --  8.6 8.7  MG  --   --  2.0  --  2.2  --   --   --   --   --   PHOS  --   --   --   --  2.8  --   --   --   --   --   TSH 2.271  --  0.748  --   --   --   --   --   --   --   < > = values in this interval not displayed. Liver Function Tests:  Recent Labs  05/15/13 1150 05/16/13 0527 05/18/13 0500  AST 23 16 22   ALT 17 12 12   ALKPHOS 87 73 69  BILITOT 0.5 0.6 0.4  PROT 8.1 7.1 7.4  ALBUMIN 3.8  3.2* 3.0*   No results found for this basename: LIPASE, AMYLASE,  in the last 8760 hours No results found for this basename: AMMONIA,  in the last 8760 hours CBC:  Recent Labs  02/11/13 1722  05/15/13 1150 05/15/13 1743 05/16/13 0527 05/18/13 0500  WBC 8.8  < > 7.8 7.2 5.6 5.0  NEUTROABS 7.0  --  5.8  --   --  2.1  HGB 11.2*  < > 12.1* 12.1* 11.1* 11.8*  HCT 34.7*  < > 36.8* 36.3* 34.1* 36.3*  MCV 86.3  < > 86.6 85.8 86.8 85.8  PLT 266  < > 159 147* 143* 167  < > = values in this interval not displayed. Lipid Panel:  Recent Labs  06/21/12 0313  CHOL 176  HDL 63  LDLCALC 102*  TRIG 56  CHOLHDL 2.8   Lab Results  Component Value Date   HGBA1C 5.3 06/21/2012    Past Procedures: Dg Chest 1 View  05/16/2013 *RADIOLOGY REPORT* Clinical Data: Shortness of breath, cough, contractures, question aspiration pneumonia CHEST - 1 VIEW Comparison: Portable exam 0745 hours compared to a 05/15/2013 Findings: Enlargement of cardiac silhouette. Mediastinal contours and pulmonary vascularity normal. Patient's  head/chin obscures lung apices. Atherosclerotic calcification aortic arch. Bibasilar atelectasis. No gross infiltrate, pleural effusion or pneumothorax. Bones appear demineralized. IMPRESSION: Enlargement of cardiac silhouette. Bibasilar atelectasis without gross infiltrate. Original Report Authenticated By: Ulyses Southward, M.D.   Dg Chest 2 View  05/15/2013 *RADIOLOGY REPORT* Clinical Data: Altered mental status. CHEST - 2 VIEW Comparison: 02/13/2013 and 02/11/2013. Findings: There is stable cardiomegaly and aortic atherosclerosis. There is mild atelectasis superimposed on chronic lung disease. No confluent airspace opacity or significant pleural effusion is present. The bones are demineralized but appear stable. IMPRESSION: Mild bibasilar atelectasis superimposed on chronic lung disease. No acute findings demonstrated. Original Report Authenticated By: Carey Bullocks, M.D.   Ct Head Wo Contrast  05/15/2013 *RADIOLOGY REPORT* Clinical Data: Altered mental status CT HEAD WITHOUT CONTRAST Technique: Contiguous axial images were obtained from the base of the skull through the vertex without contrast. Comparison: 02/02/2013 Findings: Kyphosis and suboptimal positioning limits the exam. Chronic ischemic changes in the periventricular white matter and global atrophy are stable. No mass effect, midline shift, or acute intracranial hemorrhage. The cranium is intact without obvious fracture. Partial opacification of the sphenoid sinus is again noted. IMPRESSION: Chronic ischemic changes and atrophy. Stable. Original Report Authenticated By: Jolaine Click, M.D.   Assessment/Plan 1. Achilles tendonitis, right -may be due to quinolone abx, but does not seem torn only inflamed -advised stretching and topical analgesia with monitoring, also has home health therapy  2. Acute delirium -resolved, was felt to be secondary to uti  3. UTI (urinary tract infection) -said to have these recurrently, but is likely colonized due  to his urinary retention and indwelling catheter  4. Progressive supranuclear ophthalmoplegia or palsy -is his neurologic diagnosis and reason for his fluctuating mentation, weakness, orthostatic hypotension, is progressing  5. Urinary retention with incomplete bladder emptying -follows with urology, has catheter that is changed monthly  Next appt:  3 mos

## 2013-06-08 ENCOUNTER — Emergency Department (HOSPITAL_COMMUNITY): Payer: Medicare HMO

## 2013-06-08 ENCOUNTER — Encounter (HOSPITAL_COMMUNITY): Payer: Self-pay

## 2013-06-08 ENCOUNTER — Inpatient Hospital Stay (HOSPITAL_COMMUNITY)
Admission: EM | Admit: 2013-06-08 | Discharge: 2013-06-17 | DRG: 056 | Disposition: A | Discharge status: Home Health | Payer: Medicare HMO | Attending: Internal Medicine | Admitting: Internal Medicine

## 2013-06-08 DIAGNOSIS — M6281 Muscle weakness (generalized): Secondary | ICD-10-CM

## 2013-06-08 DIAGNOSIS — M109 Gout, unspecified: Secondary | ICD-10-CM

## 2013-06-08 DIAGNOSIS — N390 Urinary tract infection, site not specified: Secondary | ICD-10-CM

## 2013-06-08 DIAGNOSIS — R488 Other symbolic dysfunctions: Secondary | ICD-10-CM

## 2013-06-08 DIAGNOSIS — Z466 Encounter for fitting and adjustment of urinary device: Secondary | ICD-10-CM

## 2013-06-08 DIAGNOSIS — G988 Other disorders of nervous system: Secondary | ICD-10-CM

## 2013-06-08 DIAGNOSIS — G20 Parkinson's disease: Secondary | ICD-10-CM | POA: Diagnosis present

## 2013-06-08 DIAGNOSIS — R6510 Systemic inflammatory response syndrome (SIRS) of non-infectious origin without acute organ dysfunction: Secondary | ICD-10-CM

## 2013-06-08 DIAGNOSIS — E038 Other specified hypothyroidism: Secondary | ICD-10-CM | POA: Diagnosis present

## 2013-06-08 DIAGNOSIS — R1310 Dysphagia, unspecified: Secondary | ICD-10-CM

## 2013-06-08 DIAGNOSIS — G238 Other specified degenerative diseases of basal ganglia: Principal | ICD-10-CM | POA: Diagnosis present

## 2013-06-08 DIAGNOSIS — E039 Hypothyroidism, unspecified: Secondary | ICD-10-CM

## 2013-06-08 DIAGNOSIS — R29818 Other symptoms and signs involving the nervous system: Secondary | ICD-10-CM | POA: Diagnosis present

## 2013-06-08 DIAGNOSIS — Z86718 Personal history of other venous thrombosis and embolism: Secondary | ICD-10-CM

## 2013-06-08 DIAGNOSIS — K623 Rectal prolapse: Secondary | ICD-10-CM

## 2013-06-08 DIAGNOSIS — Z8669 Personal history of other diseases of the nervous system and sense organs: Secondary | ICD-10-CM

## 2013-06-08 DIAGNOSIS — R627 Adult failure to thrive: Secondary | ICD-10-CM

## 2013-06-08 DIAGNOSIS — J189 Pneumonia, unspecified organism: Secondary | ICD-10-CM

## 2013-06-08 DIAGNOSIS — N400 Enlarged prostate without lower urinary tract symptoms: Secondary | ICD-10-CM

## 2013-06-08 DIAGNOSIS — F0280 Dementia in other diseases classified elsewhere without behavioral disturbance: Secondary | ICD-10-CM

## 2013-06-08 DIAGNOSIS — D62 Acute posthemorrhagic anemia: Secondary | ICD-10-CM | POA: Diagnosis present

## 2013-06-08 DIAGNOSIS — R55 Syncope and collapse: Secondary | ICD-10-CM

## 2013-06-08 DIAGNOSIS — I639 Cerebral infarction, unspecified: Secondary | ICD-10-CM

## 2013-06-08 DIAGNOSIS — E876 Hypokalemia: Secondary | ICD-10-CM | POA: Diagnosis present

## 2013-06-08 DIAGNOSIS — R4182 Altered mental status, unspecified: Secondary | ICD-10-CM

## 2013-06-08 DIAGNOSIS — S2239XA Fracture of one rib, unspecified side, initial encounter for closed fracture: Secondary | ICD-10-CM | POA: Diagnosis present

## 2013-06-08 DIAGNOSIS — I10 Essential (primary) hypertension: Secondary | ICD-10-CM

## 2013-06-08 DIAGNOSIS — B952 Enterococcus as the cause of diseases classified elsewhere: Secondary | ICD-10-CM | POA: Diagnosis present

## 2013-06-08 DIAGNOSIS — Z86711 Personal history of pulmonary embolism: Secondary | ICD-10-CM

## 2013-06-08 DIAGNOSIS — I498 Other specified cardiac arrhythmias: Secondary | ICD-10-CM

## 2013-06-08 DIAGNOSIS — K3184 Gastroparesis: Secondary | ICD-10-CM

## 2013-06-08 DIAGNOSIS — I6350 Cerebral infarction due to unspecified occlusion or stenosis of unspecified cerebral artery: Secondary | ICD-10-CM | POA: Diagnosis present

## 2013-06-08 DIAGNOSIS — M7661 Achilles tendinitis, right leg: Secondary | ICD-10-CM

## 2013-06-08 DIAGNOSIS — G92 Toxic encephalopathy: Secondary | ICD-10-CM | POA: Diagnosis present

## 2013-06-08 DIAGNOSIS — I517 Cardiomegaly: Secondary | ICD-10-CM | POA: Diagnosis present

## 2013-06-08 DIAGNOSIS — F0390 Unspecified dementia without behavioral disturbance: Secondary | ICD-10-CM

## 2013-06-08 DIAGNOSIS — G929 Unspecified toxic encephalopathy: Secondary | ICD-10-CM

## 2013-06-08 DIAGNOSIS — X58XXXA Exposure to other specified factors, initial encounter: Secondary | ICD-10-CM | POA: Diagnosis present

## 2013-06-08 DIAGNOSIS — G309 Alzheimer's disease, unspecified: Secondary | ICD-10-CM | POA: Diagnosis present

## 2013-06-08 DIAGNOSIS — G20C Parkinsonism, unspecified: Secondary | ICD-10-CM

## 2013-06-08 DIAGNOSIS — D696 Thrombocytopenia, unspecified: Secondary | ICD-10-CM | POA: Diagnosis present

## 2013-06-08 DIAGNOSIS — R531 Weakness: Secondary | ICD-10-CM

## 2013-06-08 DIAGNOSIS — R509 Fever, unspecified: Secondary | ICD-10-CM

## 2013-06-08 DIAGNOSIS — M7660 Achilles tendinitis, unspecified leg: Secondary | ICD-10-CM

## 2013-06-08 LAB — URINE MICROSCOPIC-ADD ON

## 2013-06-08 LAB — BASIC METABOLIC PANEL
CO2: 25 mEq/L (ref 19–32)
Chloride: 97 mEq/L (ref 96–112)
Glucose, Bld: 106 mg/dL — ABNORMAL HIGH (ref 70–99)
Potassium: 4 mEq/L (ref 3.5–5.1)
Sodium: 134 mEq/L — ABNORMAL LOW (ref 135–145)

## 2013-06-08 LAB — URINALYSIS, ROUTINE W REFLEX MICROSCOPIC
Glucose, UA: NEGATIVE mg/dL
Glucose, UA: NEGATIVE mg/dL
Nitrite: NEGATIVE
Protein, ur: 30 mg/dL — AB
Specific Gravity, Urine: 1.016 (ref 1.005–1.030)
Specific Gravity, Urine: 1.013 (ref 1.005–1.030)
pH: 8 (ref 5.0–8.0)
pH: 7.5 (ref 5.0–8.0)

## 2013-06-08 LAB — CBC WITH DIFFERENTIAL/PLATELET
Basophils Absolute: 0 10*3/uL (ref 0.0–0.1)
Eosinophils Relative: 0 % (ref 0–5)
Lymphocytes Relative: 6 % — ABNORMAL LOW (ref 12–46)
Lymphs Abs: 0.7 10*3/uL (ref 0.7–4.0)
MCV: 87.9 fL (ref 78.0–100.0)
Neutro Abs: 10.6 10*3/uL — ABNORMAL HIGH (ref 1.7–7.7)
Platelets: 181 10*3/uL (ref 150–400)
RBC: 4.21 MIL/uL — ABNORMAL LOW (ref 4.22–5.81)
RDW: 13.8 % (ref 11.5–15.5)
WBC: 12.1 10*3/uL — ABNORMAL HIGH (ref 4.0–10.5)

## 2013-06-08 LAB — CSF CELL COUNT WITH DIFFERENTIAL
RBC Count, CSF: 135 /mm3 — ABNORMAL HIGH
Tube #: 3
WBC, CSF: 1 /mm3 (ref 0–5)

## 2013-06-08 LAB — PROTEIN AND GLUCOSE, CSF: Total  Protein, CSF: 25 mg/dL (ref 15–45)

## 2013-06-08 MED ORDER — LEVOTHYROXINE SODIUM 100 MCG IV SOLR
12.5000 ug | Freq: Every day | INTRAVENOUS | Status: DC
  Administered 2013-06-09 – 2013-06-15 (×7): 12.5 ug via INTRAVENOUS
  Filled 2013-06-08 (×8): qty 5

## 2013-06-08 MED ORDER — SODIUM CHLORIDE 0.9 % IV SOLN
INTRAVENOUS | Status: DC
  Administered 2013-06-08 (×2): via INTRAVENOUS
  Administered 2013-06-09: 1000 mL via INTRAVENOUS
  Administered 2013-06-09 – 2013-06-10 (×2): via INTRAVENOUS
  Administered 2013-06-11: 50 mL/h via INTRAVENOUS

## 2013-06-08 MED ORDER — HYDRALAZINE HCL 20 MG/ML IJ SOLN
5.0000 mg | Freq: Four times a day (QID) | INTRAMUSCULAR | Status: DC | PRN
  Administered 2013-06-08: 5 mg via INTRAVENOUS
  Filled 2013-06-08: qty 1

## 2013-06-08 MED ORDER — MEMANTINE HCL 10 MG PO TABS
10.0000 mg | ORAL_TABLET | Freq: Two times a day (BID) | ORAL | Status: DC
  Filled 2013-06-08: qty 1

## 2013-06-08 MED ORDER — HYDRALAZINE HCL 25 MG PO TABS
12.5000 mg | ORAL_TABLET | Freq: Two times a day (BID) | ORAL | Status: DC
  Filled 2013-06-08 (×3): qty 0.5

## 2013-06-08 MED ORDER — DEXTROSE 5 % IV SOLN
2.0000 g | Freq: Once | INTRAVENOUS | Status: AC
  Administered 2013-06-08: 2 g via INTRAVENOUS
  Filled 2013-06-08: qty 2

## 2013-06-08 MED ORDER — ACETAMINOPHEN 650 MG RE SUPP
975.0000 mg | Freq: Once | RECTAL | Status: AC
  Administered 2013-06-08: 975 mg via RECTAL
  Filled 2013-06-08: qty 1

## 2013-06-08 MED ORDER — DEXTROSE 5 % IV SOLN
1.0000 g | Freq: Three times a day (TID) | INTRAVENOUS | Status: DC
  Administered 2013-06-09 – 2013-06-12 (×10): 1 g via INTRAVENOUS
  Filled 2013-06-08 (×12): qty 1

## 2013-06-08 MED ORDER — BOOST PLUS PO LIQD
237.0000 mL | Freq: Two times a day (BID) | ORAL | Status: DC
  Administered 2013-06-10: 237 mL via ORAL
  Filled 2013-06-08 (×4): qty 237

## 2013-06-08 MED ORDER — HEPARIN SODIUM (PORCINE) 5000 UNIT/ML IJ SOLN
5000.0000 [IU] | Freq: Three times a day (TID) | INTRAMUSCULAR | Status: DC
  Administered 2013-06-08 – 2013-06-17 (×26): 5000 [IU] via SUBCUTANEOUS
  Filled 2013-06-08 (×30): qty 1

## 2013-06-08 MED ORDER — LEVOTHYROXINE SODIUM 25 MCG PO TABS
25.0000 ug | ORAL_TABLET | Freq: Every day | ORAL | Status: DC
  Filled 2013-06-08: qty 1

## 2013-06-08 MED ORDER — GUAIFENESIN ER 600 MG PO TB12
600.0000 mg | ORAL_TABLET | Freq: Two times a day (BID) | ORAL | Status: DC | PRN
  Administered 2013-06-13: 600 mg via ORAL
  Filled 2013-06-08: qty 1

## 2013-06-08 MED ORDER — SODIUM CHLORIDE 0.9 % IV BOLUS (SEPSIS)
250.0000 mL | Freq: Once | INTRAVENOUS | Status: AC
  Administered 2013-06-08: 250 mL via INTRAVENOUS

## 2013-06-08 MED ORDER — VANCOMYCIN HCL IN DEXTROSE 750-5 MG/150ML-% IV SOLN
750.0000 mg | Freq: Two times a day (BID) | INTRAVENOUS | Status: DC
  Administered 2013-06-09 – 2013-06-10 (×3): 750 mg via INTRAVENOUS
  Filled 2013-06-08 (×5): qty 150

## 2013-06-08 MED ORDER — ASPIRIN EC 325 MG PO TBEC
325.0000 mg | DELAYED_RELEASE_TABLET | Freq: Every day | ORAL | Status: DC
  Filled 2013-06-08: qty 1

## 2013-06-08 MED ORDER — PIPERACILLIN-TAZOBACTAM 3.375 G IVPB 30 MIN
3.3750 g | Freq: Once | INTRAVENOUS | Status: AC
  Administered 2013-06-08: 3.375 g via INTRAVENOUS
  Filled 2013-06-08: qty 50

## 2013-06-08 MED ORDER — VANCOMYCIN HCL 10 G IV SOLR
1500.0000 mg | Freq: Once | INTRAVENOUS | Status: AC
  Administered 2013-06-08: 1500 mg via INTRAVENOUS
  Filled 2013-06-08: qty 1500

## 2013-06-08 NOTE — ED Notes (Signed)
OZH:[id number]<HQ> Expected date:<BR> Expected time:<BR> Means of arrival:<BR> Comments:<BR> EMS- possible sepsis

## 2013-06-08 NOTE — ED Notes (Signed)
Per EMS, Pt, from home, presents with altered mental status and fever.  Pt's family sts Pt was alert and "it all changed in about ."  Pt's eyes are open, but no response.  Temp was 102.6 in route.  Pt's family sts 103.6 this morning.

## 2013-06-08 NOTE — H&P (Signed)
[facility] History and Physical  Ricky [name] ZOX:[id number] DOB: 04-Dec-1916 DOA: 06/08/2013  Referring physician: Jodi Mourning, EDP PCP: Bufford Spikes, DO  Specialists:  none  Son [name] Neva Seat  # c [phone]  # h [phone]   HCPOA duaghter Tige Meas  # c [phone]   Chief Complaint: Altered mental status with fever  HPI: Ricky [name] is a 79 y.o. male recently d/c from [facility] 05/19/13. He is relatively well known to this examiner as I admitted him on 05/15/2013 with toxic metabolic encephalopathy and potential sepsis He was d/c 05/19/13 with 5 days oral Abx of Bactrim to complete the same for treatment of a Serratia and MRSA urinary tract infection  Son reports his temp was a little elevate otherwise.  He seemed to be getting along other than a poor appetite. He eventually started to walk.  On 7/1/ or 7/20 he started to have a slowing of taking PO.  This am 7/23 he stopped walking and talking, and seems to shut down-out of concern for this A fever was noticed at 98.6  Was noted at 12:00 pm was 103 celsius.  He seemed to have coughed a few times a last night.  The coughing progressed between 0800-1200 Has seen Dr. Brunilda Payor after hosptial d/c ~05/25/13 and the foley was last changed 05/15/13 Was seen yesterday at his physician for a tooth falling out on Thursday  In the emergency room workup revealed sodium 134, glucose 106, lactic acid 1.92, hematocrit 37, neutrophilia of 80%  Lumbar puncture was performed, showing RBC CSF 60 daily, BC 0, lymphocytes 2 view to count, Gram stain, = no organisms seen CT scan head 7/23 =IMPRESSION: 1. Stable atrophy and white matter disease.  2. No acute intracranial abnormality. 3. Bilateral sphenoid sinus disease. Chest x-ray = no source for fever identified, acute left 8 th rib fracture, cardiomegaly   Review of Systems:  Cannot obtain secondary to toxic metabolic encephalopathy  Admission 05/15/13 for likely  Serratia+MRSA Admission c 3/28 MRSA UTI/Met Ecephalopathy/Neurogenic bladderc Chronic indwelling foley  Admission with c 3/13 Adult FTT, Progressive supranuclear plasy  Admission c 07/01/12 syncope  Admission 06/20/12 with PNA, L sided weakness, ? Parkinson's  Admission 02/17/12 with AMs /Syncope/Macrobid UTI  Admission 11/12/11 c likely PNA  Admission 04/02/09 with Acute PE/L [place] PNA  Admission 01/19/09 Vasovagal syncope   Past Medical History  Diagnosis Date  . IBS (irritable bowel syndrome)   . Diverticulosis   . Cataract   . OAB (overactive bladder)   . Macular degeneration   . Alzheimer disease   . Hypertension   . Arthritis   . Osteoporosis   . Parkinsonian syndrome   . Syncope, vasovagal   . Neuromuscular disorder     parkisonian syndrome  . 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 Unspecified transient cerebral ischemia   . Unspecified urinary incontinence   . Senile dementia, uncomplicated   . Unspecified constipation   . Hypothyroidism   . Anemia, unspecified   . Depression   . Alzheimer's disease   . Gout, unspecified   . Thoracic or lumbosacral neuritis or radiculitis, unspecified   . Progressive supranuclear palsy   . Unspecified transient cerebral ischemia   . Unspecified urinary incontinence   . Syncope and collapse   . Paralysis agitans   . Unspecified transient cerebral ischemia   . Pneumonitis due to inhalation of food or vomitus   .  Cellulitis and abscess of oral soft tissues   . Screening for lipoid disorders   . Senile dementia, uncomplicated   . Unspecified constipation   . Disorder of bone and cartilage, unspecified   . Other specified acquired hypothyroidism   . Unspecified hypothyroidism   . Anemia, unspecified   . Depressive disorder, not elsewhere classified   . Alzheimer's disease   . Paralysis agitans   . Pneumonia, organism unspecified   . Pseudomonas infection in conditions classified elsewhere and  of unspecified site   . Unspecified essential hypertension   . Urinary tract infection, site not specified   . Osteoarthrosis, unspecified whether generalized or localized, unspecified site   . Osteoporosis, unspecified   . Gout, unspecified   . Thoracic or lumbosacral neuritis or radiculitis, unspecified    Past Surgical History  Procedure Laterality Date  . Esophagogastroduodenoscopy    . Eye surgery  2006    LEFT CATARACT   Social History:  reports that he has never smoked. He has never used smokeless tobacco. He reports that he does not drink alcohol or use illicit drugs. Usually patient baseline lives at home with his son and can perform some ADLs. He is verbal and does walk to some extent and uses a wheelchair for support   tNo Known Allergies  Family History  Problem Relation Age of Onset  . Cancer Mother   . Stroke Father   . Liver disease Father   . Kidney disease Brother   . Liver disease Brother   . Emphysema Brother      Prior to Admission medications   Medication Sig Start Date End Date Taking? Authorizing Provider  acetaminophen (TYLENOL) 500 MG tablet Take 1,000 mg by mouth 2 (two) times daily.    Yes Historical Provider, MD  aspirin EC 325 MG EC tablet Take 1 tablet (325 mg total) by mouth daily. 02/03/13  Yes Ripudeep Jenna Luo, MD  atorvastatin (LIPITOR) 10 MG tablet Take 1 tablet (10 mg total) by mouth daily at 6 PM. 06/24/12 06/24/13 Yes Jessica U Vann, DO  bacitracin ointment Apply 1 application topically 2 (two) times daily. Applied to tip of penis for skin tear.   Yes Historical Provider, MD  Calcium Carbonate-Vit D-Min (CALTRATE 600+D PLUS) 600-800 MG-UNIT CHEW Chew 1 tablet by mouth 2 (two) times daily with a meal.    Yes Historical Provider, MD  cetirizine (ZYRTEC) 10 MG tablet Take 10 mg by mouth daily as needed for allergies.    Yes Historical Provider, MD  CRANBERRY PO Take 4,200 mg by mouth 3 (three) times daily.   Yes Historical Provider, MD   Cyanocobalamin 2500 MCG TABS Take 1 tablet by mouth 2 (two) times daily.   Yes Historical Provider, MD  ferrous sulfate 325 (65 FE) MG tablet Take 325 mg by mouth every other day.    Yes Historical Provider, MD  guaiFENesin (MUCINEX) 600 MG 12 hr tablet Take 600 mg by mouth 2 (two) times daily as needed for congestion.    Yes Historical Provider, MD  hydrALAZINE (APRESOLINE) 25 MG tablet Take 12.5 mg by mouth 2 (two) times daily. 02/02/13  Yes Ripudeep Jenna Luo, MD  lactose free nutrition (BOOST) LIQD Take 90 mLs by mouth 2 (two) times daily between meals.    Yes Historical Provider, MD  levothyroxine (SYNTHROID, LEVOTHROID) 25 MCG tablet Take 25 mcg by mouth daily.   Yes Historical Provider, MD  MELATONIN EXTRA STRENGTH PO Take 15 mLs by mouth at bedtime as  needed (sleep).    Yes Historical Provider, MD  memantine (NAMENDA) 10 MG tablet Take 10 mg by mouth 2 (two) times daily.    Yes Historical Provider, MD  Multiple Vitamin (MULTIVITAMIN WITH MINERALS) TABS Take 1 tablet by mouth daily.   Yes Historical Provider, MD  Multiple Vitamins-Minerals (ICAPS AREDS FORMULA PO) Take 2 tablets by mouth 2 (two) times daily.   Yes Historical Provider, MD  polyethylene glycol (MIRALAX / GLYCOLAX) packet Take 17 g by mouth daily.   Yes Historical Provider, MD   Physical Exam: Filed Vitals:   06/08/13 1900 06/08/13 1915 06/08/13 1930 06/08/13 1945  BP: 153/92 157/80 152/93 169/90  Pulse: 85 83 82 85  Temp:      TempSrc:      Resp: 26 24 24 25   SpO2: 96% 96% 96% 97%     General:  Alert, minimally verbal African American male, does not seem to be in any acute cardiac home. No distress  Eyes: EOMI, however, closes his eyes tightly and resists examination  ENT: No JVD, vision, does not open his mouth and resists the same  Neck: No bruit  Cardiovascular: S1, S2 no murmur, rub, or gallop  Respiratory: Clinically clear  Abdomen: Soft, nontender, nondistended  Skin: Warm to touch. No lower extremity  edema, noticed he has ankle preservers on  Musculoskeletal: Range of motion intact  Psychiatric: Euthymic  Neurologic: Stiff upper extremities with rigidity. 2 biceps and triceps. Patient able to grip weakly, however, able to lift both lower extremities with 515 hour reflexes equivocal  Labs on Admission:  Basic Metabolic Panel:  Recent Labs Lab 06/08/13 1405  NA 134*  K 4.0  CL 97  CO2 25  GLUCOSE 106*  BUN 8  CREATININE 0.79  CALCIUM 9.7   Liver Function Tests: No results found for this basename: AST, ALT, ALKPHOS, BILITOT, PROT, ALBUMIN,  in the last 168 hours No results found for this basename: LIPASE, AMYLASE,  in the last 168 hours No results found for this basename: AMMONIA,  in the last 168 hours CBC:  Recent Labs Lab 06/08/13 1405  WBC 12.1*  NEUTROABS 10.6*  HGB 12.0*  HCT 37.0*  MCV 87.9  PLT 181   Cardiac Enzymes: No results found for this basename: CKTOTAL, CKMB, CKMBINDEX, TROPONINI,  in the last 168 hours  BNP (last 3 results)  Recent Labs  07/01/12 1632  PROBNP 118.0*   CBG: No results found for this basename: GLUCAP,  in the last 168 hours  Radiological Exams on Admission: Ct Head Wo Contrast  06/08/2013   *RADIOLOGY REPORT*  Clinical Data: Fever.  Altered mental status.  CT HEAD WITHOUT CONTRAST  Technique:  Contiguous axial images were obtained from the base of the skull through the vertex without contrast.  Comparison: CT head without contrast 05/15/2013.  Findings: Exaggerated cervical thoracic kyphosis is again noted. Reformatted axial images were provided.  Mild atrophy and white matter disease is similar to the prior exam.  No acute cortical infarct, hemorrhage, or mass lesion is present.  The ventricles are proportionate to the degree of atrophy.  No significant extra-axial fluid collection is present.  Bilateral sphenoid sinus opacification is again noted.  The paranasal sinuses and mastoid air cells are otherwise clear.  IMPRESSION:  1.   Stable atrophy and white matter disease. 2.  No acute intracranial abnormality. 3.  Bilateral sphenoid sinus disease.   Original Report Authenticated By: Marin Roberts, M.D.   Dg Chest [facility]  06/08/2013   *RADIOLOGY REPORT*  Clinical Data: Fever.  PORTABLE CHEST - 1 VIEW  Comparison: Plain film of the chest 05/16/2013 and 02/11/2013.  Findings: Marked cardiomegaly.  Mild basilar atelectasis on the right is noted.  No consolidative process, pneumothorax or effusion.  The patient has a fracture of the left eighth rib which is not visualized on the prior studies.  No pneumothorax.  IMPRESSION:  1.  No source for fever is identified. 2.  Acute left eighth rib fracture. 3.  Cardiomegaly.   Original Report Authenticated By: Holley Dexter, M.D.   Dg Fluoro Guide Lumbar Puncture  06/08/2013   *RADIOLOGY REPORT*  Clinical Data:Altered mental status.  LUMBAR PUNCTURE FLUORO GUIDE  Fluoroscopy Time: 43 seconds.  Comparison: Head CT performed same date.  Findings: Per request of  Dr.  Jodi Mourning, lumbar puncture was arranged under fluoroscopic guidance (no contraindications per phone discussion).  Head CT reviewed.  Witnessed phone consent was obtained from the patient's daughter and signed after the procedure and associated risks were discussed. Questions answered.  The patient was not able to lie in a supine position secondary to marked kyphosis.  Lumbar puncture had to be performed with the patient in a left side down decubitus position.  Under fluoroscopic guidance and aseptic technique, an L3-4 lumbar puncture was performed with a single pass of a 20-gauge spinal needle.  Initial blood tinged cerebral spinal fluid cleared.  Total of 10 ml of cerebrospinal fluid collected and sent for labs as per request.  Opening pressure not obtained.  No immediate complications.  IMPRESSION: Successful L3-4 lumbar puncture as noted above.   Original Report Authenticated By: Lacy Duverney, M.D.    EKG: Independently  reviewed. None performed  Assessment/Plan Principal Problem:   Toxic encephalopathy, ? INfectious Active Problems:   HYPOTHYROIDISM   ALZHEIMERS DISEASE   Gastroparesis   PULMONARY EMBOLISM, HX OF   Parkinsonian syndrome   CVA (cerebral infarction)   FTT (failure to thrive) in adult   Neurological disease   Dysphagia   SIRS, ? Early- unknonw etiology   1. Toxic metabolic encephalopathy-etiology likely sepsis of unknown etiology-potential aspiration pneumonitis vs. atelectasis leading to pneumonia secondary to acute left eighth rib fracture-he was given 2 g of IV vancomycin and has been started on Zosyn. Antibiotics will be narrowed to healthcare associated pneumonia, variety, given his recent hospitalization-this will include cefepime and vancomycin.  We will repeat complete blood counts as well as two-view chest x-ray in the morning. He will require chest physiotherapy, and potential splinting of the ribs to prevent further concerns for worsening pneumonia. 2. Early sepsis-see above narrative. 3. History of chronic indwelling Foley, 02/11/2013 with MRSA UTI and eventual neurogenic bladder-Will remove foley and re culture as has not been changed per son's report since last admit.  High threshold to think this is etiology-patient can continue his cranberry as well as D-mannose as an outpatient 4. History dysphagia-will request a speech therapy to see him and give recommendations-I believe he may have some silent aspiration 5. History of progressive supranuclear palsy. 6. History of pulmonary embolism-history of falls, and, hence, is not a Coumadin candidate. Continue aspirin 325 mg 7. History of potential Alzheimer's disease continue Namenda 10 mg daily. 8. Hypothyroidism-will repeat his TSH given last one performed was 01/27/2013    Code Status: Full  Family Communication: Discussed in detail at telephone with son Disposition Plan: 3-4 days Time spent:75    Rhetta Mura [facility] Pager [phone]  If 7PM-7AM, please contact  night-coverage [email or web address] Password [facility] 06/08/2013, 8:02 PM

## 2013-06-08 NOTE — Progress Notes (Signed)
ANTIBIOTIC CONSULT NOTE - INITIAL  Pharmacy Consult for Vancomycin Indication: PNA  No Known Allergies  Patient Measurements:   Adjusted Body Weight:  Vital Signs: Temp: 101.3 F (38.5 C) (07/23 1618) Temp src: Rectal (07/23 1618) BP: 176/87 mmHg (07/23 2100) Pulse Rate: 85 (07/23 2100) Intake/Output from previous day:   Intake/Output from this shift:    Labs:  Recent Labs  06/08/13 1405  WBC 12.1*  HGB 12.0*  PLT 181  CREATININE 0.79   The CrCl is unknown because both a height and weight (above a minimum accepted value) are required for this calculation. No results found for this basename: VANCOTROUGH, Leodis Binet, VANCORANDOM, GENTTROUGH, GENTPEAK, GENTRANDOM, TOBRATROUGH, TOBRAPEAK, TOBRARND, AMIKACINPEAK, AMIKACINTROU, AMIKACIN,  in the last 72 hours   Microbiology: Recent Results (from the past 720 hour(s))  URINE CULTURE     Status: None   Collection Time    05/15/13 11:33 AM      Result Value Range Status   Specimen Description URINE, RANDOM   Final   Special Requests NONE   Final   Culture  Setup Time 05/15/2013 18:52   Final   Colony Count >=100,000 COLONIES/ML   Final   Culture     Final   Value: Multiple bacterial morphotypes present, none predominant. Suggest appropriate recollection if clinically indicated.   Report Status 05/17/2013 FINAL   Final  URINE CULTURE     Status: None   Collection Time    05/15/13  2:43 PM      Result Value Range Status   Specimen Description URINE, CLEAN CATCH   Final   Special Requests NONE   Final   Culture  Setup Time 05/15/2013 21:02   Final   Colony Count 50,000 COLONIES/ML   Final   Culture     Final   Value: METHICILLIN RESISTANT STAPHYLOCOCCUS AUREUS     Note: RIFAMPIN AND GENTAMICIN SHOULD NOT BE USED AS SINGLE DRUGS FOR TREATMENT OF STAPH INFECTIONS. CRITICAL RESULT CALLED TO, READ BACK BY AND VERIFIED WITH: ED K. @1 :40PM ON 7.1 BY BUONO   Report Status 05/17/2013 FINAL   Final   Organism ID, Bacteria  METHICILLIN RESISTANT STAPHYLOCOCCUS AUREUS   Final  MRSA PCR SCREENING     Status: None   Collection Time    05/15/13  6:17 PM      Result Value Range Status   MRSA by PCR NEGATIVE  NEGATIVE Final   Comment:            The GeneXpert MRSA Assay (FDA     approved for NASAL specimens     only), is one component of a     comprehensive MRSA colonization     surveillance program. It is not     intended to diagnose MRSA     infection nor to guide or     monitor treatment for     MRSA infections.  URINE CULTURE     Status: None   Collection Time    05/18/13  5:35 PM      Result Value Range Status   Specimen Description URINE, CATHETERIZED   Final   Special Requests NONE   Final   Culture  Setup Time 05/18/2013 19:17   Final   Colony Count NO GROWTH   Final   Culture NO GROWTH   Final   Report Status 05/20/2013 FINAL   Final  GRAM STAIN     Status: None   Collection Time    06/08/13  6:24 PM  Result Value Range Status   Specimen Description CSF   Final   Special Requests Normal   Final   Gram Stain     Final   Value: NO ORGANISMS SEEN     NO WBC SEEN     CYTOSPIN SAMPLE     Gram Stain Report Called to,Read Back By and Verified With: NEASE,A. AT 1948 ON 07.23.14 BY LOVE,T.   Report Status 06/08/2013 FINAL   Final    Medical History: Past Medical History  Diagnosis Date  . IBS (irritable bowel syndrome)   . Diverticulosis   . Cataract   . OAB (overactive bladder)   . Macular degeneration   . Alzheimer disease   . Hypertension   . Arthritis   . Osteoporosis   . Parkinsonian syndrome   . Syncope, vasovagal   . Neuromuscular disorder     parkisonian syndrome  . 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 Unspecified transient cerebral ischemia   . Unspecified urinary incontinence   . Senile dementia, uncomplicated   . Unspecified constipation   . Hypothyroidism   . Anemia, unspecified   . Depression   . Alzheimer's disease   . Gout,  unspecified   . Thoracic or lumbosacral neuritis or radiculitis, unspecified   . Progressive supranuclear palsy   . Unspecified transient cerebral ischemia   . Unspecified urinary incontinence   . Syncope and collapse   . Paralysis agitans   . Unspecified transient cerebral ischemia   . Pneumonitis due to inhalation of food or vomitus   . Cellulitis and abscess of oral soft tissues   . Screening for lipoid disorders   . Senile dementia, uncomplicated   . Unspecified constipation   . Disorder of bone and cartilage, unspecified   . Other specified acquired hypothyroidism   . Unspecified hypothyroidism   . Anemia, unspecified   . Depressive disorder, not elsewhere classified   . Alzheimer's disease   . Paralysis agitans   . Pneumonia, organism unspecified   . Pseudomonas infection in conditions classified elsewhere and of unspecified site   . Unspecified essential hypertension   . Urinary tract infection, site not specified   . Osteoarthrosis, unspecified whether generalized or localized, unspecified site   . Osteoporosis, unspecified   . Gout, unspecified   . Thoracic or lumbosacral neuritis or radiculitis, unspecified     Assessment: 47 yoM admitted with AMS and fever.  Recently hospitalized for toxic metabolic encephalopathy and urosepsis.  Hx chronic intermittent UTIs. Started on vancomycin per pharmacy and cefepime for HCAP.    7/23>> CTX x 1 7/23>> Zosyn x 1 7/23>> Cefepime >> 7/23>> Vancomycin >>   Wt: 75.5kg  Renal: SCr 0.79, CrCl ~60 ml/min, N 58.   Tm24h: 102.9  WBC elevated @ 12.1  Cultures:  7/23: 2/2 Blood: collected 7/23: Urine collected 7/23: CSF: gram stain normal, glucose WNL, protein WNL, elevated RBCs, few WBCs.   Goal of Therapy:  Vancomycin trough level 15-20 mcg/ml  Plan:  1.  Vancomycin 1500mg  IV x 1, then 750mg  IV q 12 hours 2.  Continue cefepime at current dose. 3.   F/u vancomycin trough at steady state, renal fxn, Tm, cultures,  clinical course  Haynes Hoehn, PharmD 06/08/2013 9:54 PM  Pager: [phone]

## 2013-06-08 NOTE — ED Notes (Signed)
Patient signed out with pending AMS work up and fevers. No source at this time.  Radiology completed guided LP, initial results no acute findings. Rocephin given early in course, zosyn added for broader coverage/ aspiration history.  Rechecked, no acute changes. Spoke with radiology and hospitalist for admission.  Labs Reviewed  CBC WITH DIFFERENTIAL - Abnormal; Notable for the following:    WBC 12.1 (*)    RBC 4.21 (*)    Hemoglobin 12.0 (*)    HCT 37.0 (*)    Neutrophils Relative % 88 (*)    Neutro Abs 10.6 (*)    Lymphocytes Relative 6 (*)    All other components within normal limits  BASIC METABOLIC PANEL - Abnormal; Notable for the following:    Sodium 134 (*)    Glucose, Bld 106 (*)    GFR calc non Af Amer 75 (*)    GFR calc Af Amer 87 (*)    All other components within normal limits  URINALYSIS, ROUTINE W REFLEX MICROSCOPIC - Abnormal; Notable for the following:    Hgb urine dipstick SMALL (*)    Protein, ur 30 (*)    Leukocytes, UA SMALL (*)    All other components within normal limits  CSF CELL COUNT WITH DIFFERENTIAL - Abnormal; Notable for the following:    RBC Count, CSF 60 (*)    All other components within normal limits  GRAM STAIN  URINE CULTURE  CULTURE, BLOOD (ROUTINE X 2)  CULTURE, BLOOD (ROUTINE X 2)  CSF CULTURE  URINE MICROSCOPIC-ADD ON  PROTEIN AND GLUCOSE, CSF  URINALYSIS, ROUTINE W REFLEX MICROSCOPIC  CG4 I-STAT (LACTIC ACID)   Ct Head Wo Contrast  06/08/2013   *RADIOLOGY REPORT*  Clinical Data: Fever.  Altered mental status.  CT HEAD WITHOUT CONTRAST  Technique:  Contiguous axial images were obtained from the base of the skull through the vertex without contrast.  Comparison: CT head without contrast 05/15/2013.  Findings: Exaggerated cervical thoracic kyphosis is again noted. Reformatted axial images were provided.  Mild atrophy and white matter disease is similar to the prior exam.  No acute cortical infarct, hemorrhage, or mass lesion is present.   The ventricles are proportionate to the degree of atrophy.  No significant extra-axial fluid collection is present.  Bilateral sphenoid sinus opacification is again noted.  The paranasal sinuses and mastoid air cells are otherwise clear.  IMPRESSION:  1.  Stable atrophy and white matter disease. 2.  No acute intracranial abnormality. 3.  Bilateral sphenoid sinus disease.   Original Report Authenticated By: Marin Roberts, M.D.   Dg Chest Port 1 View  06/08/2013   *RADIOLOGY REPORT*  Clinical Data: Fever.  PORTABLE CHEST - 1 VIEW  Comparison: Plain film of the chest 05/16/2013 and 02/11/2013.  Findings: Marked cardiomegaly.  Mild basilar atelectasis on the right is noted.  No consolidative process, pneumothorax or effusion.  The patient has a fracture of the left eighth rib which is not visualized on the prior studies.  No pneumothorax.  IMPRESSION:  1.  No source for fever is identified. 2.  Acute left eighth rib fracture. 3.  Cardiomegaly.   Original Report Authenticated By: Holley Dexter, M.D.   Dg Fluoro Guide Lumbar Puncture  06/08/2013   *RADIOLOGY REPORT*  Clinical Data:Altered mental status.  LUMBAR PUNCTURE FLUORO GUIDE  Fluoroscopy Time: 43 seconds.  Comparison: Head CT performed same date.  Findings: Per request of  Dr.  Jodi Mourning, lumbar puncture was arranged under fluoroscopic guidance (no contraindications per phone  discussion).  Head CT reviewed.  Witnessed phone consent was obtained from the patient's daughter and signed after the procedure and associated risks were discussed. Questions answered.  The patient was not able to lie in a supine position secondary to marked kyphosis.  Lumbar puncture had to be performed with the patient in a left side down decubitus position.  Under fluoroscopic guidance and aseptic technique, an L3-4 lumbar puncture was performed with a single pass of a 20-gauge spinal needle.  Initial blood tinged cerebral spinal fluid cleared.  Total of 10 ml of cerebrospinal  fluid collected and sent for labs as per request.  Opening pressure not obtained.  No immediate complications.  IMPRESSION: Successful L3-4 lumbar puncture as noted above.   Original Report Authenticated By: Lacy Duverney, M.D.     Enid Skeens, MD 06/08/13 2051

## 2013-06-08 NOTE — ED Provider Notes (Signed)
History    CSN: [id number] Arrival date & time 06/08/13  1326  First MD Initiated Contact with Patient 06/08/13 1333     Chief Complaint  Patient presents with  . Altered Mental Status  . Fever   (Consider location/radiation/quality/duration/timing/severity/associated sxs/prior Treatment) HPI Comments: Ricky [name] is a 79 y.o. male Who presents by EMS for evaluation of altered mental status. This occurred about 45 minutes after he awoke this morning. Initially, he had been in his usual " vigorous" self. The patient cannot contribute to history. His son gives the history. The patient usually eats on his own, but requires assistance to move around. He has chronic intermittent urinary tract infections, but none recently. There are no other known modifying factors.  Patient is a 79 y.o. male presenting with altered mental status and fever. The history is provided by the patient.  Altered Mental Status Associated symptoms: fever   Fever  Past Medical History  Diagnosis Date  . IBS (irritable bowel syndrome)   . Diverticulosis   . Cataract   . OAB (overactive bladder)   . Macular degeneration   . Alzheimer disease   . Hypertension   . Arthritis   . Osteoporosis   . Parkinsonian syndrome   . Syncope, vasovagal   . Neuromuscular disorder     parkisonian syndrome  . 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 Unspecified transient cerebral ischemia   . Unspecified urinary incontinence   . Senile dementia, uncomplicated   . Unspecified constipation   . Hypothyroidism   . Anemia, unspecified   . Depression   . Alzheimer's disease   . Gout, unspecified   . Thoracic or lumbosacral neuritis or radiculitis, unspecified   . Progressive supranuclear palsy   . Unspecified transient cerebral ischemia   . Unspecified urinary incontinence   . Syncope and collapse   . Paralysis agitans   . Unspecified transient cerebral ischemia   . Pneumonitis due to  inhalation of food or vomitus   . Cellulitis and abscess of oral soft tissues   . Screening for lipoid disorders   . Senile dementia, uncomplicated   . Unspecified constipation   . Disorder of bone and cartilage, unspecified   . Other specified acquired hypothyroidism   . Unspecified hypothyroidism   . Anemia, unspecified   . Depressive disorder, not elsewhere classified   . Alzheimer's disease   . Paralysis agitans   . Pneumonia, organism unspecified   . Pseudomonas infection in conditions classified elsewhere and of unspecified site   . Unspecified essential hypertension   . Urinary tract infection, site not specified   . Osteoarthrosis, unspecified whether generalized or localized, unspecified site   . Osteoporosis, unspecified   . Gout, unspecified   . Thoracic or lumbosacral neuritis or radiculitis, unspecified    Past Surgical History  Procedure Laterality Date  . Esophagogastroduodenoscopy    . Eye surgery  2006    LEFT CATARACT   Family History  Problem Relation Age of Onset  . Cancer Mother   . Stroke Father   . Liver disease Father   . Kidney disease Brother   . Liver disease Brother   . Emphysema Brother    History  Substance Use Topics  . Smoking status: Never Smoker   . Smokeless tobacco: Never Used  . Alcohol Use: No    Review of Systems  Unable to perform ROS Constitutional: Positive for fever.  Psychiatric/Behavioral: Positive for altered mental status.  Allergies  Review of patient's allergies indicates no known allergies.  Home Medications   No current outpatient prescriptions on file. BP 155/77  Pulse 66  Temp(Src) 98.1 F (36.7 C) (Oral)  Resp 20  Ht 5\' 10"  (1.778 m)  Wt 166 lb 7.2 oz (75.5 kg)  BMI 23.88 kg/m2  SpO2 100% Physical Exam  Nursing note and vitals reviewed. Constitutional: He appears well-developed.  Frail, elderly  HENT:  Head: Normocephalic and atraumatic.  Right Ear: External ear normal.  Left Ear: External  ear normal.  Eyes: Conjunctivae and EOM are normal. Pupils are equal, round, and reactive to light.  Neck: Normal range of motion and phonation normal. Neck supple.  Cardiovascular: Normal rate, regular rhythm, normal heart sounds and intact distal pulses.   Pulmonary/Chest: Effort normal and breath sounds normal. He exhibits no bony tenderness.  Abdominal: Soft. Normal appearance. There is no tenderness.  Genitourinary:  Foley catheter in place in penis  Musculoskeletal: Normal range of motion.  Neurological: He is alert. He has normal strength. No cranial nerve deficit or sensory deficit.  No evident focal asymmetry of strength.  Mild increased tone, bilateral arms.  Skin: Skin is warm, dry and intact.  Psychiatric: His behavior is normal.    ED Course  Procedures (including critical care time) Medications  0.9 %  sodium chloride infusion (1,000 mLs Intravenous New Bag/Given 06/09/13 1534)  guaiFENesin (MUCINEX) 12 hr tablet 600 mg (not administered)  lactose free nutrition (BOOST PLUS) liquid 237 mL (237 mLs Oral Not Given 06/09/13 1400)  heparin injection 5,000 Units (5,000 Units Subcutaneous Given 06/09/13 2250)  ceFEPIme (MAXIPIME) 1 g in dextrose 5 % 50 mL IVPB (1 g Intravenous Given 06/09/13 2250)  levothyroxine (SYNTHROID, LEVOTHROID) injection 12.5 mcg (12.5 mcg Intravenous Given 06/09/13 0930)  hydrALAZINE (APRESOLINE) injection 5 mg (5 mg Intravenous Given 06/08/13 2258)  vancomycin (VANCOCIN) IVPB 750 mg/150 ml premix (750 mg Intravenous Given 06/09/13 2254)  hydrALAZINE (APRESOLINE) tablet 25 mg (25 mg Oral Given 06/09/13 2250)  acetaminophen (TYLENOL) suppository 975 mg (975 mg Rectal Given 06/08/13 1356)  sodium chloride 0.9 % bolus 250 mL (0 mLs Intravenous Stopped 06/08/13 1639)  cefTRIAXone (ROCEPHIN) 2 g in dextrose 5 % 50 mL IVPB (0 g Intravenous Stopped 06/08/13 1757)  piperacillin-tazobactam (ZOSYN) IVPB 3.375 g (3.375 g Intravenous Given 06/08/13 2131)  vancomycin  (VANCOCIN) 1,500 mg in sodium chloride 0.9 % 500 mL IVPB (1,500 mg Intravenous Given 06/08/13 2302)     Patient Vitals for the past 24 hrs:  BP Temp Temp src Pulse Resp SpO2  06/09/13 2334 155/77 mmHg - - - - -  06/09/13 2221 172/87 mmHg 98.1 F (36.7 C) Oral 66 20 100 %  06/09/13 1300 135/69 mmHg 99.5 F (37.5 C) Oral 75 15 99 %  06/09/13 0941 133/67 mmHg 100 F (37.8 C) Oral 70 - -  06/09/13 0435 154/72 mmHg 100.6 F (38.1 C) Oral 72 22 99 %  06/09/13 0106 156/86 mmHg 101 F (38.3 C) Oral 83 26 99 %   4:23 PM Reevaluation with update and discussion. After initial assessment and treatment, an updated evaluation reveals no change in status. Body survey for source of infection; no apparent cellulitis, joint swelling, or decubitis ulcer (feet appear normal). [facility] [employer]    Date: 06/08/13  Rate: 105  Rhythm: sinus tachycardia  QRS Axis: normal  PR and QT Intervals: normal  ST/T Wave abnormalities: normal  PR and QRS Conduction Disutrbances:left anterior fascicular block and right bundle-branch block  Narrative Interpretation:   Old EKG Reviewed: unchanged from 02/11/13       Labs Reviewed  CBC WITH DIFFERENTIAL - Abnormal; Notable for the following:    WBC 12.1 (*)    RBC 4.21 (*)    Hemoglobin 12.0 (*)    HCT 37.0 (*)    Neutrophils Relative % 88 (*)    Neutro Abs 10.6 (*)    Lymphocytes Relative 6 (*)    All other components within normal limits  BASIC METABOLIC PANEL - Abnormal; Notable for the following:    Sodium 134 (*)    Glucose, Bld 106 (*)    GFR calc non Af Amer 75 (*)    GFR calc Af Amer 87 (*)    All other components within normal limits  URINALYSIS, ROUTINE W REFLEX MICROSCOPIC - Abnormal; Notable for the following:    Hgb urine dipstick SMALL (*)    Protein, ur 30 (*)    Leukocytes, UA SMALL (*)    All other components within normal limits  CSF CELL COUNT WITH DIFFERENTIAL - Abnormal; Notable for the following:    RBC Count, CSF 60 (*)    All  other components within normal limits  URINALYSIS, ROUTINE W REFLEX MICROSCOPIC - Abnormal; Notable for the following:    APPearance HAZY (*)    Hgb urine dipstick SMALL (*)    Protein, ur 30 (*)    Leukocytes, UA MODERATE (*)    All other components within normal limits  CSF CELL COUNT WITH DIFFERENTIAL - Abnormal; Notable for the following:    Appearance, CSF CLEAR (*)    RBC Count, CSF 135 (*)    All other components within normal limits  URINE CULTURE  CSF CULTURE  GRAM STAIN  CULTURE, BLOOD (ROUTINE X 2)  CULTURE, BLOOD (ROUTINE X 2)  GRAM STAIN  CULTURE, EXPECTORATED SPUTUM-ASSESSMENT  URINE MICROSCOPIC-ADD ON  PROTEIN AND GLUCOSE, CSF  LEGIONELLA ANTIGEN, URINE  STREP PNEUMONIAE URINARY ANTIGEN  URINE MICROSCOPIC-ADD ON  CBC  BASIC METABOLIC PANEL  CG4 I-STAT (LACTIC ACID)   Ct Head Wo Contrast  06/08/2013   *RADIOLOGY REPORT*  Clinical Data: Fever.  Altered mental status.  CT HEAD WITHOUT CONTRAST  Technique:  Contiguous axial images were obtained from the base of the skull through the vertex without contrast.  Comparison: CT head without contrast 05/15/2013.  Findings: Exaggerated cervical thoracic kyphosis is again noted. Reformatted axial images were provided.  Mild atrophy and white matter disease is similar to the prior exam.  No acute cortical infarct, hemorrhage, or mass lesion is present.  The ventricles are proportionate to the degree of atrophy.  No significant extra-axial fluid collection is present.  Bilateral sphenoid sinus opacification is again noted.  The paranasal sinuses and mastoid air cells are otherwise clear.  IMPRESSION:  1.  Stable atrophy and white matter disease. 2.  No acute intracranial abnormality. 3.  Bilateral sphenoid sinus disease.   Original Report Authenticated By: Marin Roberts, M.D.   Dg Chest Port 1 View  06/08/2013   *RADIOLOGY REPORT*  Clinical Data: Fever.  PORTABLE CHEST - 1 VIEW  Comparison: Plain film of the chest 05/16/2013 and  02/11/2013.  Findings: Marked cardiomegaly.  Mild basilar atelectasis on the right is noted.  No consolidative process, pneumothorax or effusion.  The patient has a fracture of the left eighth rib which is not visualized on the prior studies.  No pneumothorax.  IMPRESSION:  1.  No source for fever is identified. 2.  Acute left eighth rib fracture. 3.  Cardiomegaly.   Original Report Authenticated By: Holley Dexter, M.D.   Dg Fluoro Guide Lumbar Puncture  06/08/2013   *RADIOLOGY REPORT*  Clinical Data:Altered mental status.  LUMBAR PUNCTURE FLUORO GUIDE  Fluoroscopy Time: 43 seconds.  Comparison: Head CT performed same date.  Findings: Per request of  Dr.  Jodi Mourning, lumbar puncture was arranged under fluoroscopic guidance (no contraindications per phone discussion).  Head CT reviewed.  Witnessed phone consent was obtained from the patient's daughter and signed after the procedure and associated risks were discussed. Questions answered.  The patient was not able to lie in a supine position secondary to marked kyphosis.  Lumbar puncture had to be performed with the patient in a left side down decubitus position.  Under fluoroscopic guidance and aseptic technique, an L3-4 lumbar puncture was performed with a single pass of a 20-gauge spinal needle.  Initial blood tinged cerebral spinal fluid cleared.  Total of 10 ml of cerebrospinal fluid collected and sent for labs as per request.  Opening pressure not obtained.  No immediate complications.  IMPRESSION: Successful L3-4 lumbar puncture as noted above.   Original Report Authenticated By: Lacy Duverney, M.D.   1. Altered mental status   2. SIRS (systemic inflammatory response syndrome)   3. Achilles tendonitis, right     MDM  Fever with altered mental status. No clear source for fever. Patient is debilitated. He'll need admission for stabilization, and further treatment.   Plan: Admit     Flint Melter, MD 06/09/13 [phone]

## 2013-06-08 NOTE — ED Notes (Signed)
Pts. mental status improved from admission, he is now verbally responding and is oriented to self.

## 2013-06-08 NOTE — ED Notes (Signed)
Patient transported to CT 

## 2013-06-08 NOTE — Progress Notes (Signed)
Pt unable to sign up for My Chart. Briscoe Burns BSN, RN-BC Admissions RN  06/08/2013 8:18 PM

## 2013-06-08 NOTE — ED Notes (Addendum)
Phone Numbers:  Gumecindo Hopkin (son)  [phone]                                  Durant Scibilia (dtr)   [phone]

## 2013-06-08 NOTE — ED Notes (Addendum)
Pt taken to Interventional radiology.  Consent form and Daughter/General Guardian's phone number sent with Tech.

## 2013-06-08 NOTE — ED Notes (Signed)
Pt's family sts Pt normally carries on a conversation and will have a slight decline when he has a UTI.  Sts decline normally takes 36-48hrs, but this morning's decline was sudden.

## 2013-06-08 NOTE — Procedures (Signed)
Per request of Dr. Jodi Mourning, lumbar puncture was arranged under flouro guidance.  CT reviewed.  Witnessed phone consent obtained from daughter and son.  Pt not able to lie supine.  L3-4 LP had to be performed with pt in left side down position.  Patient monitored throughout procedure.  Sterille technique utilized.  Single pass 20g spinal needle.  No OP obtained.  First tube slightly blood tinged which cleared.  10 cc CSF collected and sent for labs as per request.  No immediate complications.

## 2013-06-09 ENCOUNTER — Telehealth: Payer: Self-pay | Admitting: Neurology

## 2013-06-09 DIAGNOSIS — M7660 Achilles tendinitis, unspecified leg: Secondary | ICD-10-CM

## 2013-06-09 LAB — LEGIONELLA ANTIGEN, URINE

## 2013-06-09 MED ORDER — HYDRALAZINE HCL 25 MG PO TABS
25.0000 mg | ORAL_TABLET | Freq: Two times a day (BID) | ORAL | Status: DC
  Administered 2013-06-09 – 2013-06-12 (×6): 25 mg via ORAL
  Filled 2013-06-09 (×7): qty 1

## 2013-06-09 NOTE — Evaluation (Signed)
Clinical/Bedside Swallow Evaluation Patient Details  Name: Ricky [name] MRN: [id number] Date of Birth: Feb 03, 1917  Today's Date: 06/09/2013 Time: 4098-1191 SLP Time Calculation (min): 41 min  Past Medical History:  Past Medical History  Diagnosis Date  . IBS (irritable bowel syndrome)   . Diverticulosis   . Cataract   . OAB (overactive bladder)   . Macular degeneration   . Alzheimer disease   . Hypertension   . Arthritis   . Osteoporosis   . Parkinsonian syndrome   . Syncope, vasovagal   . Neuromuscular disorder     parkisonian syndrome  . 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 Unspecified transient cerebral ischemia   . Unspecified urinary incontinence   . Senile dementia, uncomplicated   . Unspecified constipation   . Hypothyroidism   . Anemia, unspecified   . Depression   . Alzheimer's disease   . Gout, unspecified   . Thoracic or lumbosacral neuritis or radiculitis, unspecified   . Progressive supranuclear palsy   . Unspecified transient cerebral ischemia   . Unspecified urinary incontinence   . Syncope and collapse   . Paralysis agitans   . Unspecified transient cerebral ischemia   . Pneumonitis due to inhalation of food or vomitus   . Cellulitis and abscess of oral soft tissues   . Screening for lipoid disorders   . Senile dementia, uncomplicated   . Unspecified constipation   . Disorder of bone and cartilage, unspecified   . Other specified acquired hypothyroidism   . Unspecified hypothyroidism   . Anemia, unspecified   . Depressive disorder, not elsewhere classified   . Alzheimer's disease   . Paralysis agitans   . Pneumonia, organism unspecified   . Pseudomonas infection in conditions classified elsewhere and of unspecified site   . Unspecified essential hypertension   . Urinary tract infection, site not specified   . Osteoarthrosis, unspecified whether generalized or localized, unspecified site   . Osteoporosis,  unspecified   . Gout, unspecified   . Thoracic or lumbosacral neuritis or radiculitis, unspecified    Past Surgical History:  Past Surgical History  Procedure Laterality Date  . Esophagogastroduodenoscopy    . Eye surgery  2006    LEFT CATARACT   HPI:  79 yo male admited to [facility] with AMS, fever, toxic encephalopathy and found to be septic.  PMH + for Parkinsons' (paralysis antigens), PSP, dementia,IBS, UTI, pna, pneumonitis secondary to inhalation of food or vomitus, cellulitis and abscess of oral soft tissues, chronic foley cath, depression, TIA, and divertiulosis.  Pt had an LP that was negative, CXR negative except for rib fx, CT head negative.  Pt with h/o dysphagia, bedside swallow evaluation ordered.    Assessment / Plan / Recommendation Clinical Impression  Pt responded to SLP questions and directions approx 30% of opportunities with significant delays due to his cognition.  He presents with moderate oral dysphagia with motor deficits resulting in decreased oral control, delayed transiting and prolonged mastication.  Pharyngeal swallow suspected to be delayed but strong as pt without indications of pharyngeal stasis nor aspiration.  Pt may have residuals that he does not sense based on MBS finding in 2013 (inconsistent sensation) but appeared to effectively protect his airway today.  Due to pt's dementia, PSP, inefficient swallow and reliance on others for feeding his aspiration and malnutrition risk is high.    Rec strict adherence to aspiration precautions to mitigate risk, most importantly allowing pt time to trigger swallow as swallow  is excessively delayed.  Note pt CXR did not indicate pna, however pt continues with low grade fever.  Hopeful for swallow to improve to baseline status with improved medical status.   Pt was educated to aspiration precautions but cognitive deficits prevent his comprehension and unfortunately no family was present to educate.     Aspiration Risk   Moderate    Diet Recommendation Dysphagia 1 (Puree);Thin liquid   Liquid Administration via: Cup;Straw Medication Administration: Whole meds with puree Supervision: Staff feed patient Compensations: Slow rate;Small sips/bites (delayed swallow-allow time for swallow) Postural Changes and/or Swallow Maneuvers: Upright 30-60 min after meal;Seated upright 90 degrees    Other  Recommendations Oral Care Recommendations: Oral care before and after PO   Follow Up Recommendations       Frequency and Duration min 1 x/week  1 week   Pertinent Vitals/Pain febrile, decreased    SLP Swallow Goals Patient will utilize recommended strategies during swallow to increase swallowing safety with: Total assistance   Swallow Study Prior Functional Status   pt states he is fed by family at home and has occasional problems swallowing food more than drink (via yes/no ?s)    General HPI: 79 yo male admited to [facility] with AMS, fever, toxic encephalopathy and found to be septic.  PMH + for Parkinsons' (paralysis antigens), PSP, dementia,IBS, UTI, pna, pneumonitis secondary to inhalation of food or vomitus, cellulitis and abscess of oral soft tissues, chronic foley cath, depression, TIA, and divertiulosis.  Pt had an LP that was negative, CXR negative except for rib fx, CT head negative.  Pt with h/o dysphagia, bedside swallow evaluation ordered.  Type of Study: Bedside swallow evaluation Diet Prior to this Study: NPO Temperature Spikes Noted: No Respiratory Status: Supplemental O2 delivered via (comment) History of Recent Intubation: No Behavior/Cognition: Alert;Doesn't follow directions;Decreased sustained attention (delayed responses) Oral Cavity - Dentition: Adequate natural dentition Self-Feeding Abilities: Total assist Patient Positioning: Upright in bed Baseline Vocal Quality: Low vocal intensity;Clear Volitional Cough: Cognitively unable to elicit Volitional Swallow: Unable to elicit     Oral/Motor/Sensory Function Overall Oral Motor/Sensory Function: Impaired (pt did not follow directions, able to seal lips on cup) Facial ROM:  (? mild asymmetry, masked facies) Velum:  (DNT, pt did not open mouth adequately) Mandible: Impaired (pt did not open mouth adequately, ? cognitive or motor)   Ice Chips Ice chips: Not tested   Thin Liquid Thin Liquid: Impaired Presentation: Straw;Cup Oral Phase Impairments: Impaired anterior to posterior transit;Reduced lingual movement/coordination Oral Phase Functional Implications: Prolonged oral transit Pharyngeal  Phase Impairments: Suspected delayed Swallow    Nectar Thick Nectar Thick Liquid: Impaired Presentation: Cup;Straw;Spoon Oral Phase Impairments: Reduced lingual movement/coordination;Impaired anterior to posterior transit Oral phase functional implications: Prolonged oral transit Pharyngeal Phase Impairments: Suspected delayed Swallow   Honey Thick Honey Thick Liquid: Not tested   Puree Puree: Impaired Oral Phase Impairments: Impaired anterior to posterior transit;Reduced lingual movement/coordination Oral Phase Functional Implications: Prolonged oral transit Pharyngeal Phase Impairments: Suspected delayed Swallow   Solid   GO    Solid: Impaired Oral Phase Impairments: Impaired anterior to posterior transit;Reduced lingual movement/coordination Oral Phase Functional Implications:  (delayed mastication) Pharyngeal Phase Impairments: Suspected delayed Swallow Other Comments: difficult to view for oral stasis as pt does not open mouth adequately       Donavan Burnet, MS [facility] SLP [phone]

## 2013-06-09 NOTE — Telephone Encounter (Signed)
Returned call. No answer.  

## 2013-06-09 NOTE — Progress Notes (Signed)
Patient ID: Ricky [name], male   DOB: [birth year], 79 y.o.   MRN: [id number]  [facility] PROGRESS NOTE  Ricky [name] MVH:[id number] DOB: June 05, 1917 DOA: 06/08/2013 PCP: Bufford Spikes, DO  Brief narrative: 79 y.o. male who was recently discharged from the hospital 05/19/2013 after he was hospitalized for toxic metabolic encephalopathy secondary to sepsis due to Serratia and MRSA urinary tract infection. Patient was discharged home with antibiotic Bactrim to complete therapy for 5 days and per his son, he has completed therapy. Please note that patient was unable to provide history at the time of the admission and son who was present at bedside provided the information. Per son, patient has been doing well post discharge, but has shown signs of sudden deterioration, noted several days prior to this admission, unable to take anything by mouth, mostly bedbound, non-ambulatory, associated with fevers as high as 103 Fahrenheit, productive cough of yellow sputum, labored breathing even with minimal exertion. Please note that review of records indicate that urologist Dr. Brunilda Payor has changed Foley catheter on 05/15/2013. In the emergency department, lumbar puncture was performed, showing RBC CSF 60 daily, BC 0, lymphocytes 2 view to count, Gram stain no organisms seen. Tmax recorded 103 Fahrenheit. CT scan of the head showed stable atrophy and white matter disease with no acute intracranial abnormalities. Chest x-ray did not reveal source of fever , acute left 8th rib fracture noted.   Principal Problem:   Toxic encephalopathy, ? Infectious - This appears to be multifactorial in etiology, possibly infectious, questionable hospital-acquired pneumonia given recent hospitalization, progressive failure to thrive and deconditioning - Agree with continuing broad-spectrum antibiotic coverage to include hospital acquired pneumonia, vancomycin and Maxipime day #2 - Continue supportive care with oxygen, followup  on urine Legionella and strep pneumo analysis - Continue Tylenol as needed for fever, check CBC in the morning to followup in white blood cell count Active Problems:   HYPOTHYROIDISM - Continue Synthroid as per home medical regimen   ALZHEIMERS DISEASE - Progressive, patient unable to answer questions, does not follow commands on exam   PULMONARY EMBOLISM, HX OF - Patient not anticoagulation candidate due to the high risk of fall   FTT (failure to thrive) in adult - Encourage by mouth intake, speech and swallow evaluation ordered, will followup on results   Hypertension, accelerated - Continue hydralazine, patient responding well and blood pressure better controlled Consultants:  None  Procedures/Studies: Ct Head Wo Contrast  06/08/2013   Stable atrophy and white matter disease. No acute intracranial abnormality. Bilateral sphenoid sinus disease.    Dg Chest Port 1 View  06/08/2013  No source for fever is identified. Acute left eighth rib fracture.  Cardiomegaly.    Dg Fluoro Guide Lumbar Puncture 06/08/2013  Successful L3-4 lumbar puncture as noted above.     Antibiotics:  Vancomycin 07/23 -->  Maxipime 07/23 -->  Code Status: Full Family Communication: No family at bedside, left message for his son on cell phone  Disposition Plan: Home when medically stable  HPI/Subjective: No events overnight.   Objective: Filed Vitals:   06/08/13 2135 06/09/13 0106 06/09/13 0435 06/09/13 0941  BP: 198/88 156/86 154/72 133/67  Pulse: 86 83 72 70  Temp: 100.5 F (38.1 C) 101 F (38.3 C) 100.6 F (38.1 C) 100 F (37.8 C)  TempSrc: Oral Oral Oral Oral  Resp: 26 26 22    Height: 5\' 10"  (1.778 m)     Weight: 75.5 kg (166 lb 7.2 oz)     SpO2:  99% 99% 99%     Intake/Output Summary (Last 24 hours) at 06/09/13 1042 Last data filed at 06/09/13 1610  Gross per 24 hour  Intake 1781.25 ml  Output    725 ml  Net 1056.25 ml    Exam:   General:  Pt is alert,  Minimally verbal, answers  yes or no, does not follow commands appropriately   Cardiovascular: Regular rate and rhythm, S1/S2, no murmurs, no rubs, no gallops  Respiratory: Clear to auscultation bilaterally, scattered rhonchi, decreased breath sounds at bases   Abdomen: Soft, non tender, non distended, bowel sounds present, no guarding  Extremities: No edema, pulses DP and PT palpable bilaterally  Data Reviewed: Basic Metabolic Panel:  Recent Labs Lab 06/08/13 1405  NA 134*  K 4.0  CL 97  CO2 25  GLUCOSE 106*  BUN 8  CREATININE 0.79  CALCIUM 9.7   CBC:  Recent Labs Lab 06/08/13 1405  WBC 12.1*  NEUTROABS 10.6*  HGB 12.0*  HCT 37.0*  MCV 87.9  PLT 181    Recent Results (from the past 240 hour(s))  CSF CULTURE     Status: None   Collection Time    06/08/13  6:22 PM      Result Value Range Status   Specimen Description CSF   Final   Special Requests Normal   Final   Gram Stain     Final   Value: NO WBC SEEN     NO ORGANISMS SEEN     CYTOSPIN   Culture PENDING   Incomplete   Report Status PENDING   Incomplete  GRAM STAIN     Status: None   Collection Time    06/08/13  6:24 PM      Result Value Range Status   Specimen Description CSF   Final   Special Requests Normal   Final   Gram Stain     Final   Value: NO ORGANISMS SEEN     NO WBC SEEN     CYTOSPIN SAMPLE     Gram Stain Report Called to,Read Back By and Verified With: NEASE,A. AT 1948 ON 07.23.14 BY LOVE,T.   Report Status 06/08/2013 FINAL   Final     Scheduled Meds: . ceFEPime IV  1 g Intravenous Q8H  . heparin  5,000 Units Subcutaneous Q8H  . hydrALAZINE  12.5 mg Oral BID  . levothyroxine  12.5 mcg Intravenous QAC breakfast  . vancomycin  750 mg Intravenous Q12H   Continuous Infusions: . sodium chloride 125 mL/hr at 06/09/13 0936   Debbora Presto, MD  [facility] Pager [phone]  If 7PM-7AM, please contact night-coverage [email or web address] Password [facility] 06/09/2013, 10:42 AM   LOS: 1 day

## 2013-06-09 NOTE — Care Management Note (Addendum)
Page 1 of 2   06/14/2013     11:40:13 AM   CARE MANAGEMENT NOTE 06/14/2013  Patient:  LEMOINE, GOYNE   Account Number:  [id number]  Date Initiated:  06/09/2013  Documentation initiated by:  Lanier Clam  Subjective/Objective Assessment:   ADMITTED W/AMS.TOXIC ENCEPHALOPATHY.READMIT 6/29-05/19/13.NW:GNFAOZHY,QMVHQIONGEXB PALSY.     Action/Plan:   FROM HOME W/FAMILY. (GENERAL GUARDIAN))DTR-LISA Larcom C#[phone] .HAS PCP,PHARMACY.HAS CANE,RW,3N1.ACTIVE [facility]-HHRN.PRIVATE SITTER.   Anticipated DC Date:  06/14/2013   Anticipated DC Plan:  HOME W HOME HEALTH SERVICES      DC Planning Services  CM consult      Choice offered to / List presented to:  C-2 HC POA / Guardian        HH arranged  HH-1 RN  HH-2 PT  HH-3 OT  HH-4 NURSE'S AIDE      HH agency  [facility]   Status of service:  Completed, signed off Medicare Important Message given?   (If response is "NO", the following Medicare IM given date fields will be blank) Date Medicare IM given:   Date Additional Medicare IM given:    Discharge Disposition:  HOME W HOME HEALTH SERVICES  Per UR Regulation:  Reviewed for med. necessity/level of care/duration of stay  If discussed at Long Length of Stay Meetings, dates discussed:   06/14/2013    Comments:  06/14/13 Darrin Koman RN,BSN NCM [phone] TC [facility] SPOKE TO DIANE TEL#[phone],FAXED W/CONFIRMATION TO FAX#[phone], HH ORDERS,D/C SUMMARY, FACE TO FACE,THEY ARE ABLE TO PROVIDE SERVICES.  06/13/13 Luanna Weesner RN,BSN NCM [phone] TC [facility]#[phone] DIANE,INFORMED HER OF POSSIBLE D/C TOMORROW IF MED STABLE,THEY WILL BE ABLE TO SERVICE.Marland KitchenHHRN/PT.WILL FAX D/C SUMMARY,& HH ORDERS TO FAX#[phone].  06/10/13 Cayleigh Paull RN,BSN NCM [phone] 4P SPOKE TO SON [name] OCNFIRMING D/C PLANS WHILE DTR LISA ON PHONETHAT HE CONTACTED [facility] SPOKE TO DIANE-RN,TAMARA-SUPV THAT THEY WILL BE ABLE TO SERVICE PATIENT ,& WILL HAVE STAFF  AVAILABLE.ITC [facility] SPOKE TO DIANECONFIRMEDTHAT THEY WILL BE ABLE TO SERVICE PATIENT, FAXED W/CONFIRMATION FACESHEET & H&P TO FAX#[phone].AWAIT FINAL HH ORDERS.RECOMMEND HHRN/PT.  SPOKE TO SON ABOUT D/C PLANS.HE ADAMANTLY DECLINES SNF.HE HAS CONCERNS ABOUT [facility]-HE REALLY WANTS [facility] TO SERVICE PATIENT EVEN IF IT MEANS WAITING 2 WEEKS SINCE HE MANAGES PATIENT ANYWAY @ HOME.[facility] UNABLE TO SERVICE DUE TO INSURANCE.SPOKE TO LISA DTR ON PHONE & SHE ALSO AGREES W/HOME,BUT WOULD LIKE [facility] TO JUST FOLLOW ALONG IN CASE HHC IS NEEDED.RECOMMENDED THAT THEY CONTACT [facility] W/CONCERNS THAT THEY HAVE W/SERVICING THEM.I HAVE CONTACTED [facility]IN SPOKE TO DIANE WHO SAYS SHE CURRENTLY CANNOT SERVICE PATIENT.TC KRISTEN [facility] REP INFORMED OF REFERRAL AS A BACK UP FOR FAMILY.AWAIT FINAL HHC ORDERS.   06/09/13 Viktorya Arguijo RN,BSN NCM [phone] 3P RECEIVED CALL FROM [facility] DIANE REP,STATES THAT SHE HAS SPOKEN TO DTR LISA & EXPLAINED THATTHEY WILL NOT BE ABLE TO SERVICE.I CALLED DTR LISA TO OFFER HHC AGENCY LIST CHOICES OVER THE PHONE,& WILL ALSO LEAVE LIST IN RM.DTR VOICED UNDERSTANDING.  SPOKE TO DTR LISA Steward C#[phone](GENERAL GUARDIAN) ABOUT D/C PLANS,TO RETURN HOME W/FAMILY, & RESUME HHC W/[facility].TC [facility] SPOKE TO DIANE TO CONFIRM ACTIVE SERVICES TEL#[phone],THEY ARE HAVING STAFFING ISSUES,& WILL CONTACT DTR ABOUT SERVICES.THEY MAY OR MAY NOT BE ABLE TO SERVICE PATIENT.[facility] WILL CONTACT DTR & CALL ME BACK.WILL LEAVE HHC AGENCY LIST IN RM AS A RESOURCE IF [facility] UNABLE TO SERVICE.FAMILY ABLE TO TAKE HOME IN OWN VEHICLE.AWAIT  PT RECOMMENDATIONS.

## 2013-06-09 NOTE — Progress Notes (Signed)
Pt began to respond throughout the day. Pt answers simple commands and ate half his dinner this evening.

## 2013-06-10 DIAGNOSIS — F0280 Dementia in other diseases classified elsewhere without behavioral disturbance: Secondary | ICD-10-CM

## 2013-06-10 LAB — CBC
MCH: 28.3 pg (ref 26.0–34.0)
MCV: 88 fL (ref 78.0–100.0)
Platelets: 138 10*3/uL — ABNORMAL LOW (ref 150–400)
RDW: 14 % (ref 11.5–15.5)
WBC: 6 10*3/uL (ref 4.0–10.5)

## 2013-06-10 LAB — BASIC METABOLIC PANEL
Calcium: 8.7 mg/dL (ref 8.4–10.5)
Chloride: 103 mEq/L (ref 96–112)
Creatinine, Ser: 0.7 mg/dL (ref 0.50–1.35)
GFR calc Af Amer: >90 mL/min (ref >90)
Sodium: 137 mEq/L (ref 135–145)

## 2013-06-10 LAB — URINE CULTURE: Colony Count: >=100000

## 2013-06-10 LAB — VANCOMYCIN, TROUGH: Vancomycin Tr: 8.1 ug/mL — ABNORMAL LOW (ref 10.0–20.0)

## 2013-06-10 MED ORDER — POTASSIUM CHLORIDE CRYS ER 20 MEQ PO TBCR
40.0000 meq | EXTENDED_RELEASE_TABLET | Freq: Once | ORAL | Status: AC
  Administered 2013-06-10: 40 meq via ORAL
  Filled 2013-06-10: qty 2

## 2013-06-10 MED ORDER — VANCOMYCIN HCL IN DEXTROSE 1-5 GM/200ML-% IV SOLN
1000.0000 mg | Freq: Two times a day (BID) | INTRAVENOUS | Status: DC
  Administered 2013-06-11 – 2013-06-14 (×8): 1000 mg via INTRAVENOUS
  Filled 2013-06-10 (×9): qty 200

## 2013-06-10 MED ORDER — BOOST / RESOURCE BREEZE PO LIQD
1.0000 | Freq: Two times a day (BID) | ORAL | Status: DC
  Administered 2013-06-10 – 2013-06-17 (×13): 1 via ORAL

## 2013-06-10 NOTE — Progress Notes (Signed)
ANTIBIOTIC CONSULT NOTE - Follow Up  Pharmacy Consult for Vancomycin Indication: PNA  No Known Allergies  Patient Measurements: Height: 5\' 10"  (177.8 cm) Weight: 166 lb 7.2 oz (75.5 kg) IBW/kg (Calculated) : 73 Adjusted Body Weight:  Vital Signs: Temp: 98.2 F (36.8 C) (07/25 1254) Temp src: Axillary (07/25 1254) BP: 149/78 mmHg (07/25 1254) Pulse Rate: 72 (07/25 1254) Intake/Output from previous day: 07/24 0701 - 07/25 0700 In: 2777.9 [P.O.:240; I.V.:2087.9; IV Piggyback:450] Out: 1700 [Urine:1700] Intake/Output from this shift: Total I/O In: 410 [P.O.:260; IV Piggyback:150] Out: -   Labs:  Recent Labs  06/08/13 1405 06/10/13 0400  WBC 12.1* 6.0  HGB 12.0* 10.6*  PLT 181 138*  CREATININE 0.79 0.70   Estimated Creatinine Clearance: 59.6 ml/min (by C-G formula based on Cr of 0.7). No results found for this basename: VANCOTROUGH, Leodis Binet, VANCORANDOM, GENTTROUGH, GENTPEAK, GENTRANDOM, TOBRATROUGH, TOBRAPEAK, TOBRARND, AMIKACINPEAK, AMIKACINTROU, AMIKACIN,  in the last 72 hours   Microbiology: Recent Results (from the past 720 hour(s))  URINE CULTURE     Status: None   Collection Time    05/15/13 11:33 AM      Result Value Range Status   Specimen Description URINE, RANDOM   Final   Special Requests NONE   Final   Culture  Setup Time 05/15/2013 18:52   Final   Colony Count >=100,000 COLONIES/ML   Final   Culture     Final   Value: Multiple bacterial morphotypes present, none predominant. Suggest appropriate recollection if clinically indicated.   Report Status 05/17/2013 FINAL   Final  URINE CULTURE     Status: None   Collection Time    05/15/13  2:43 PM      Result Value Range Status   Specimen Description URINE, CLEAN CATCH   Final   Special Requests NONE   Final   Culture  Setup Time 05/15/2013 21:02   Final   Colony Count 50,000 COLONIES/ML   Final   Culture     Final   Value: METHICILLIN RESISTANT STAPHYLOCOCCUS AUREUS     Note: RIFAMPIN AND  GENTAMICIN SHOULD NOT BE USED AS SINGLE DRUGS FOR TREATMENT OF STAPH INFECTIONS. CRITICAL RESULT CALLED TO, READ BACK BY AND VERIFIED WITH: ED K. @1 :40PM ON 7.1 BY BUONO   Report Status 05/17/2013 FINAL   Final   Organism ID, Bacteria METHICILLIN RESISTANT STAPHYLOCOCCUS AUREUS   Final  MRSA PCR SCREENING     Status: None   Collection Time    05/15/13  6:17 PM      Result Value Range Status   MRSA by PCR NEGATIVE  NEGATIVE Final   Comment:            The GeneXpert MRSA Assay (FDA     approved for NASAL specimens     only), is one component of a     comprehensive MRSA colonization     surveillance program. It is not     intended to diagnose MRSA     infection nor to guide or     monitor treatment for     MRSA infections.  URINE CULTURE     Status: None   Collection Time    05/18/13  5:35 PM      Result Value Range Status   Specimen Description URINE, CATHETERIZED   Final   Special Requests NONE   Final   Culture  Setup Time 05/18/2013 19:17   Final   Colony Count NO GROWTH   Final   Culture  NO GROWTH   Final   Report Status 05/20/2013 FINAL   Final  URINE CULTURE     Status: None   Collection Time    06/08/13  1:55 PM      Result Value Range Status   Specimen Description URINE, CATHETERIZED   Final   Special Requests NONE   Final   Culture  Setup Time 06/09/2013 00:20   Final   Colony Count >=100,000 COLONIES/ML   Final   Culture ENTEROCOCCUS SPECIES   Final   Report Status PENDING   Incomplete  CULTURE, BLOOD (ROUTINE X 2)     Status: None   Collection Time    06/08/13  2:00 PM      Result Value Range Status   Specimen Description BLOOD LEFT HAND   Final   Special Requests BOTTLES DRAWN AEROBIC ONLY 3CC   Final   Culture  Setup Time 06/09/2013 00:51   Final   Culture     Final   Value:        BLOOD CULTURE RECEIVED NO GROWTH TO DATE CULTURE WILL BE HELD FOR 5 DAYS BEFORE ISSUING A FINAL NEGATIVE REPORT   Report Status PENDING   Incomplete  CULTURE, BLOOD (ROUTINE X 2)      Status: None   Collection Time    06/08/13  2:05 PM      Result Value Range Status   Specimen Description BLOOD LEFT ARM   Final   Special Requests BOTTLES DRAWN AEROBIC AND ANAEROBIC 5CC   Final   Culture  Setup Time 06/09/2013 00:51   Final   Culture     Final   Value:        BLOOD CULTURE RECEIVED NO GROWTH TO DATE CULTURE WILL BE HELD FOR 5 DAYS BEFORE ISSUING A FINAL NEGATIVE REPORT   Report Status PENDING   Incomplete  CSF CULTURE     Status: None   Collection Time    06/08/13  6:22 PM      Result Value Range Status   Specimen Description CSF   Final   Special Requests Normal   Final   Gram Stain     Final   Value: NO WBC SEEN     NO ORGANISMS SEEN     CYTOSPIN   Culture NO GROWTH 1 DAY   Final   Report Status PENDING   Incomplete  GRAM STAIN     Status: None   Collection Time    06/08/13  6:24 PM      Result Value Range Status   Specimen Description CSF   Final   Special Requests Normal   Final   Gram Stain     Final   Value: NO ORGANISMS SEEN     NO WBC SEEN     CYTOSPIN SAMPLE     Gram Stain Report Called to,Read Back By and Verified With: NEASE,A. AT 1948 ON 07.23.14 BY LOVE,T.   Report Status 06/08/2013 FINAL   Final    Medical History: Past Medical History  Diagnosis Date  . IBS (irritable bowel syndrome)   . Diverticulosis   . Cataract   . OAB (overactive bladder)   . Macular degeneration   . Alzheimer disease   . Hypertension   . Arthritis   . Osteoporosis   . Parkinsonian syndrome   . Syncope, vasovagal   . Neuromuscular disorder     parkisonian syndrome  . 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  Unspecified transient cerebral ischemia   . Unspecified urinary incontinence   . Senile dementia, uncomplicated   . Unspecified constipation   . Hypothyroidism   . Anemia, unspecified   . Depression   . Alzheimer's disease   . Gout, unspecified   . Thoracic or lumbosacral neuritis or radiculitis, unspecified   .  Progressive supranuclear palsy   . Unspecified transient cerebral ischemia   . Unspecified urinary incontinence   . Syncope and collapse   . Paralysis agitans   . Unspecified transient cerebral ischemia   . Pneumonitis due to inhalation of food or vomitus   . Cellulitis and abscess of oral soft tissues   . Screening for lipoid disorders   . Senile dementia, uncomplicated   . Unspecified constipation   . Disorder of bone and cartilage, unspecified   . Other specified acquired hypothyroidism   . Unspecified hypothyroidism   . Anemia, unspecified   . Depressive disorder, not elsewhere classified   . Alzheimer's disease   . Paralysis agitans   . Pneumonia, organism unspecified   . Pseudomonas infection in conditions classified elsewhere and of unspecified site   . Unspecified essential hypertension   . Urinary tract infection, site not specified   . Osteoarthrosis, unspecified whether generalized or localized, unspecified site   . Osteoporosis, unspecified   . Gout, unspecified   . Thoracic or lumbosacral neuritis or radiculitis, unspecified     Assessment: 47 yoM admitted with AMS and fever.  Recently hospitalized for toxic metabolic encephalopathy and urosepsis.  Hx chronic intermittent UTIs. Toxic encepahlopathy with questionable infectious etiology and considering HCAP given recent hospitalization.  Started on vancomycin per pharmacy and cefepime for HCAP.  7/23 urine culture growing enterococcus species.  7/23>> CTX x 1 7/23>> Zosyn x 1 7/23>> Cefepime >> 7/23>> Vancomycin >>   Wt: 75.5kg  Renal function appears stable. SCr 0.70, CrCl ~60 ml/min, N 58.   Tm24h: 100.6  WBC improved, now WNL  Cultures:  7/23: 2/2 Blood: NGTD 7/23: Urine: >100,000 enterococcus species 7/23: CSF: gram stain normal, glucose WNL, protein WNL, elevated RBCs, few WBCs. No organisms seen. Cx no growth 1 day.   Goal of Therapy:  Vancomycin trough level 15-20 mcg/ml  Plan:  1.  Continue  Vancomycin 750 mg IV q12h and Cefepime 1g IV q8h. 2.  Vancomycin trough tonight. 3.  F/u culture results and sensitivities.  Clance Boll, PharmD, BCPS Pager: [phone] 06/10/2013 1:58 PM

## 2013-06-10 NOTE — Progress Notes (Signed)
Patient ID: Ricky [name], male   DOB: 31-Aug-1917, 79 y.o.   MRN: [id number] [facility] PROGRESS NOTE  FITZ MATSUO WUJ:[id number] DOB: 1916-12-03 DOA: 06/08/2013 PCP: Bufford Spikes, DO  Brief narrative:  79 y.o. male who was recently discharged from the hospital 05/19/2013 after he was hospitalized for toxic metabolic encephalopathy secondary to sepsis due to Serratia and MRSA urinary tract infection. Patient was discharged home with antibiotic Bactrim to complete therapy for 5 days and per his son, he has completed therapy. Please note that patient was unable to provide history at the time of the admission and son who was present at bedside provided the information. Per son, patient has been doing well post discharge, but has shown signs of sudden deterioration, noted several days prior to this admission, unable to take anything by mouth, mostly bedbound, non-ambulatory, associated with fevers as high as 103 Fahrenheit, productive cough of yellow sputum, labored breathing even with minimal exertion. Please note that review of records indicate that urologist Dr. Brunilda Payor has changed Foley catheter on 05/15/2013. In the emergency department, lumbar puncture was performed, showing RBC CSF 60 daily, BC 0, lymphocytes 2 view to count, Gram stain no organisms seen. Tmax recorded 103 Fahrenheit. CT scan of the head showed stable atrophy and white matter disease with no acute intracranial abnormalities. Chest x-ray did not reveal source of fever , acute left 8th rib fracture noted.   Principal Problem:  Toxic encephalopathy, ? Infectious  - This appears to be multifactorial in etiology, possibly infectious, questionable hospital-acquired pneumonia given recent hospitalization, progressive failure to thrive and deconditioning  - Agree with continuing broad-spectrum antibiotic coverage to include hospital acquired pneumonia, vancomycin and Maxipime day #3 - Continue supportive care with oxygen, followup  on urine Legionella and strep pneumo analysis  - Continue Tylenol as needed for fever, WBC is within normal limits this AM - UA with enterococcus in urine but final report pending, current ABX should cover  Active Problems:  Acute blood loss anemia - slight drop in Hg since admission and likely dilutional, no signs of active bleed - repeat CBC in AM Hypokalemia - mild and will supplement today  - repeat BMP in AM Thrombocytopenia - slight drop in Plt since admission, no signs of active bleed - close monitor for now and continue Heparin SQ for DVT prophylaxis HYPOTHYROIDISM  - Continue Synthroid as per home medical regimen  ALZHEIMERS DISEASE  - Progressive, patient unable to answer questions, does not follow commands on exam  PULMONARY EMBOLISM, HX OF  - Patient not anticoagulation candidate due to the high risk of fall  FTT (failure to thrive) in adult  - Encourage by mouth intake, speech and swallow evaluation ordered, will followup on results  Hypertension, accelerated  - Continue hydralazine, patient responding well and blood pressure better controlled   Consultants:  None Procedures/Studies:  Ct Head Wo Contrast 06/08/2013 Stable atrophy and white matter disease. No acute intracranial abnormality. Bilateral sphenoid sinus disease.  Dg Chest Port 1 View 06/08/2013 No source for fever is identified. Acute left eighth rib fracture. Cardiomegaly.  Dg Fluoro Guide Lumbar Puncture 06/08/2013 Successful L3-4 lumbar puncture as noted above.  Antibiotics:  Vancomycin 07/23 -->  Maxipime 07/23 -->  Code Status: Full  Family Communication: No family at bedside, left message for his son on cell phone  Disposition Plan: Home when medically stable   HPI/Subjective: No events overnight.   Objective: Filed Vitals:   06/09/13 2221 06/09/13 2334 06/10/13 0530 06/10/13 9562  BP: 172/87 155/77 161/75 154/76  Pulse: 66  56   Temp: 98.1 F (36.7 C)  99.2 F (37.3 C)   TempSrc: Oral  Oral    Resp: 20  28   Height:      Weight:      SpO2: 100%  99%     Intake/Output Summary (Last 24 hours) at 06/10/13 0816 Last data filed at 06/10/13 0981  Gross per 24 hour  Intake 2727.91 ml  Output   1700 ml  Net 1027.91 ml    Exam:   General:  Pt is alert, does not follow commands appropriately, not in acute distress, answers questions with yes and no  Cardiovascular: Regular rate and rhythm, S1/S2, no murmurs, no rubs, no gallops  Respiratory: Clear to auscultation bilaterally, no wheezing, decreased breath sounds at bases   Abdomen: Soft, non tender, non distended, bowel sounds present, no guarding  Extremities: No edema, pulses DP and PT palpable bilaterally  Data Reviewed: Basic Metabolic Panel:  Recent Labs Lab 06/08/13 1405 06/10/13 0400  NA 134* 137  K 4.0 3.3*  CL 97 103  CO2 25 24  GLUCOSE 106* 89  BUN 8 8  CREATININE 0.79 0.70  CALCIUM 9.7 8.7   CBC:  Recent Labs Lab 06/08/13 1405 06/10/13 0400  WBC 12.1* 6.0  NEUTROABS 10.6*  --   HGB 12.0* 10.6*  HCT 37.0* 32.9*  MCV 87.9 88.0  PLT 181 138*   Recent Results (from the past 240 hour(s))  URINE CULTURE     Status: None   Collection Time    06/08/13  1:55 PM      Result Value Range Status   Specimen Description URINE, CATHETERIZED   Final   Special Requests NONE   Final   Culture  Setup Time 06/09/2013 00:20   Final   Colony Count >=100,000 COLONIES/ML   Final   Culture ENTEROCOCCUS SPECIES   Final   Report Status PENDING   Incomplete  CULTURE, BLOOD (ROUTINE X 2)     Status: None   Collection Time    06/08/13  2:00 PM      Result Value Range Status   Specimen Description BLOOD LEFT HAND   Final   Special Requests BOTTLES DRAWN AEROBIC ONLY 3CC   Final   Culture  Setup Time 06/09/2013 00:51   Final   Culture     Final   Value:        BLOOD CULTURE RECEIVED NO GROWTH TO DATE CULTURE WILL BE HELD FOR 5 DAYS BEFORE ISSUING A FINAL NEGATIVE REPORT   Report Status PENDING   Incomplete   CULTURE, BLOOD (ROUTINE X 2)     Status: None   Collection Time    06/08/13  2:05 PM      Result Value Range Status   Specimen Description BLOOD LEFT ARM   Final   Special Requests BOTTLES DRAWN AEROBIC AND ANAEROBIC 5CC   Final   Culture  Setup Time 06/09/2013 00:51   Final   Culture     Final   Value:        BLOOD CULTURE RECEIVED NO GROWTH TO DATE CULTURE WILL BE HELD FOR 5 DAYS BEFORE ISSUING A FINAL NEGATIVE REPORT   Report Status PENDING   Incomplete  CSF CULTURE     Status: None   Collection Time    06/08/13  6:22 PM      Result Value Range Status   Specimen Description CSF   Final  Special Requests Normal   Final   Gram Stain     Final   Value: NO WBC SEEN     NO ORGANISMS SEEN     CYTOSPIN   Culture NO GROWTH   Final   Report Status PENDING   Incomplete  GRAM STAIN     Status: None   Collection Time    06/08/13  6:24 PM      Result Value Range Status   Specimen Description CSF   Final   Special Requests Normal   Final   Gram Stain     Final   Value: NO ORGANISMS SEEN     NO WBC SEEN     CYTOSPIN SAMPLE     Gram Stain Report Called to,Read Back By and Verified With: NEASE,A. AT 1948 ON 07.23.14 BY LOVE,T.   Report Status 06/08/2013 FINAL   Final     Scheduled Meds: . ceFEPime (MAXIPIME) IV  1 g Intravenous Q8H  . heparin  5,000 Units Subcutaneous Q8H  . hydrALAZINE  25 mg Oral BID  . lactose free nutrition  237 mL Oral BID BM  . levothyroxine  12.5 mcg Intravenous QAC breakfast  . vancomycin  750 mg Intravenous Q12H   Continuous Infusions: . sodium chloride 1,000 mL (06/09/13 1534)     Debbora Presto, MD  [facility] Pager [phone]  If 7PM-7AM, please contact night-coverage [email or web address] Password [email or web address] 06/10/2013, 8:16 AM   LOS: 2 days

## 2013-06-10 NOTE — Progress Notes (Signed)
INITIAL NUTRITION ASSESSMENT  DOCUMENTATION CODES Per approved criteria  -Not Applicable   INTERVENTION: Change supplement to Resource Breeze BID  NUTRITION DIAGNOSIS: Inadequate oral intake related to medical condition as evidenced by 8% wt loss in less than one month.  Goal: Pt to meet >/= 90% of their estimated nutrition needs    Monitor:  PO intake Weight Labs  Reason for Assessment: Per RN/ family request  79 y.o. male  Admitting Dx: Toxic encephalopathy  ASSESSMENT: 79 y.o. male recently d/c from [facility] 05/19/13 with 5 days oral Abx of Bactrim to complete the same for treatment of a Serratia and MRSA urinary tract infection. He seemed to be getting along other than a poor appetite. He eventually started to walk. Pt started to have a slowing of taking PO. This am 7/23 he stopped walking and talking, and seems to shut down-out of concern for this. Pt has an order for Boost Plus BID but, pt's son reports that pt hasn't been doing well with any supplements than contain milk products due to them increasing pt's secretions. Son states pt can feed himself and eat chopped/pureed foods when he is in good health but, he has increased dysphagia and decreased appetite when he gets sick. Son reports that pt's usual body weight is 178 lbs. Pt typically has a great appetite, eats 3 meals daily, and has Boost BID.   Height: Ht Readings from Last 1 Encounters:  06/08/13 5\' 10"  (1.778 m)    Weight: Wt Readings from Last 1 Encounters:  06/08/13 166 lb 7.2 oz (75.5 kg)    Ideal Body Weight: 166 lbs  % Ideal Body Weight: 100%  Wt Readings from Last 10 Encounters:  06/08/13 166 lb 7.2 oz (75.5 kg)  05/15/13 180 lb (81.647 kg)  05/02/13 178 lb (80.74 kg)  03/07/13 177 lb (80.287 kg)  02/15/13 179 lb 6.4 oz (81.375 kg)  11/29/12 180 lb (81.647 kg)  10/19/12 180 lb 6.4 oz (81.829 kg)  07/01/12 172 lb (78.019 kg)  06/21/12 170 lb 3.2 oz (77.202 kg)  02/22/12 173 lb 8 oz (78.7 kg)     Usual Body Weight: 178 lbs  % Usual Body Weight: 93%  BMI:  Body mass index is 23.88 kg/(m^2).  Estimated Nutritional Needs: Kcal: 1800-2000 Protein: 95-105 grams Fluid: 2.2 L  Skin:  Intact; non-pitting RLE and LLE edema  Diet Order: Dysphagia  EDUCATION NEEDS: -No education needs identified at this time   Intake/Output Summary (Last 24 hours) at 06/10/13 1152 Last data filed at 06/10/13 1040  Gross per 24 hour  Intake 3037.91 ml  Output   1700 ml  Net 1337.91 ml    Last BM: PTA  Labs:   Recent Labs Lab 06/08/13 1405 06/10/13 0400  NA 134* 137  K 4.0 3.3*  CL 97 103  CO2 25 24  BUN 8 8  CREATININE 0.79 0.70  CALCIUM 9.7 8.7  GLUCOSE 106* 89    CBG (last 3)  No results found for this basename: GLUCAP,  in the last 72 hours  Scheduled Meds: . ceFEPime (MAXIPIME) IV  1 g Intravenous Q8H  . heparin  5,000 Units Subcutaneous Q8H  . hydrALAZINE  25 mg Oral BID  . lactose free nutrition  237 mL Oral BID BM  . levothyroxine  12.5 mcg Intravenous QAC breakfast  . vancomycin  750 mg Intravenous Q12H    Continuous Infusions: . sodium chloride 50 mL/hr at 06/10/13 0700    Past Medical History  Diagnosis  Date  . IBS (irritable bowel syndrome)   . Diverticulosis   . Cataract   . OAB (overactive bladder)   . Macular degeneration   . Alzheimer disease   . Hypertension   . Arthritis   . Osteoporosis   . Parkinsonian syndrome   . Syncope, vasovagal   . Neuromuscular disorder     parkisonian syndrome  . 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 Unspecified transient cerebral ischemia   . Unspecified urinary incontinence   . Senile dementia, uncomplicated   . Unspecified constipation   . Hypothyroidism   . Anemia, unspecified   . Depression   . Alzheimer's disease   . Gout, unspecified   . Thoracic or lumbosacral neuritis or radiculitis, unspecified   . Progressive supranuclear palsy   . Unspecified transient  cerebral ischemia   . Unspecified urinary incontinence   . Syncope and collapse   . Paralysis agitans   . Unspecified transient cerebral ischemia   . Pneumonitis due to inhalation of food or vomitus   . Cellulitis and abscess of oral soft tissues   . Screening for lipoid disorders   . Senile dementia, uncomplicated   . Unspecified constipation   . Disorder of bone and cartilage, unspecified   . Other specified acquired hypothyroidism   . Unspecified hypothyroidism   . Anemia, unspecified   . Depressive disorder, not elsewhere classified   . Alzheimer's disease   . Paralysis agitans   . Pneumonia, organism unspecified   . Pseudomonas infection in conditions classified elsewhere and of unspecified site   . Unspecified essential hypertension   . Urinary tract infection, site not specified   . Osteoarthrosis, unspecified whether generalized or localized, unspecified site   . Osteoporosis, unspecified   . Gout, unspecified   . Thoracic or lumbosacral neuritis or radiculitis, unspecified     Past Surgical History  Procedure Laterality Date  . Esophagogastroduodenoscopy    . Eye surgery  2006    LEFT CATARACT    Ian Malkin RD, LDN Inpatient Clinical Dietitian Pager: [phone] After Hours Pager: [phone]

## 2013-06-10 NOTE — Progress Notes (Signed)
Notified by son that while patient was eating lunch his left lower canine tooth fell out. Son states this happened at home last Tuesday also. Son to take tooth home.

## 2013-06-10 NOTE — Progress Notes (Signed)
ANTIBIOTIC CONSULT NOTE - FOLLOW UP  Pharmacy Consult for vancomycin Indication: PNA  No Known Allergies  Patient Measurements: Height: 5\' 10"  (177.8 cm) Weight: 166 lb 7.2 oz (75.5 kg) IBW/kg (Calculated) : 73 Adjusted Body Weight:   Vital Signs: Temp: 99.9 F (37.7 C) (07/25 2141) Temp src: Oral (07/25 2141) BP: 167/83 mmHg (07/25 2141) Pulse Rate: 72 (07/25 2141) Intake/Output from previous day: 07/24 0701 - 07/25 0700 In: 2777.9 [P.O.:240; I.V.:2087.9; IV Piggyback:450] Out: 1700 [Urine:1700] Intake/Output from this shift:    Labs:  Recent Labs  06/08/13 1405 06/10/13 0400  WBC 12.1* 6.0  HGB 12.0* 10.6*  PLT 181 138*  CREATININE 0.79 0.70   Estimated Creatinine Clearance: 59.6 ml/min (by C-G formula based on Cr of 0.7).  Recent Labs  06/10/13 2055  VANCOTROUGH 8.1*     Microbiology: Recent Results (from the past 720 hour(s))  URINE CULTURE     Status: None   Collection Time    05/15/13 11:33 AM      Result Value Range Status   Specimen Description URINE, RANDOM   Final   Special Requests NONE   Final   Culture  Setup Time 05/15/2013 18:52   Final   Colony Count >=100,000 COLONIES/ML   Final   Culture     Final   Value: Multiple bacterial morphotypes present, none predominant. Suggest appropriate recollection if clinically indicated.   Report Status 05/17/2013 FINAL   Final  URINE CULTURE     Status: None   Collection Time    05/15/13  2:43 PM      Result Value Range Status   Specimen Description URINE, CLEAN CATCH   Final   Special Requests NONE   Final   Culture  Setup Time 05/15/2013 21:02   Final   Colony Count 50,000 COLONIES/ML   Final   Culture     Final   Value: METHICILLIN RESISTANT STAPHYLOCOCCUS AUREUS     Note: RIFAMPIN AND GENTAMICIN SHOULD NOT BE USED AS SINGLE DRUGS FOR TREATMENT OF STAPH INFECTIONS. CRITICAL RESULT CALLED TO, READ BACK BY AND VERIFIED WITH: ED K. @1 :40PM ON 7.1 BY BUONO   Report Status 05/17/2013 FINAL   Final    Organism ID, Bacteria METHICILLIN RESISTANT STAPHYLOCOCCUS AUREUS   Final  MRSA PCR SCREENING     Status: None   Collection Time    05/15/13  6:17 PM      Result Value Range Status   MRSA by PCR NEGATIVE  NEGATIVE Final   Comment:            The GeneXpert MRSA Assay (FDA     approved for NASAL specimens     only), is one component of a     comprehensive MRSA colonization     surveillance program. It is not     intended to diagnose MRSA     infection nor to guide or     monitor treatment for     MRSA infections.  URINE CULTURE     Status: None   Collection Time    05/18/13  5:35 PM      Result Value Range Status   Specimen Description URINE, CATHETERIZED   Final   Special Requests NONE   Final   Culture  Setup Time 05/18/2013 19:17   Final   Colony Count NO GROWTH   Final   Culture NO GROWTH   Final   Report Status 05/20/2013 FINAL   Final  URINE CULTURE  Status: None   Collection Time    06/08/13  1:55 PM      Result Value Range Status   Specimen Description URINE, CATHETERIZED   Final   Special Requests NONE   Final   Culture  Setup Time 06/09/2013 00:20   Final   Colony Count >=100,000 COLONIES/ML   Final   Culture ENTEROCOCCUS SPECIES   Final   Report Status 06/10/2013 FINAL   Final   Organism ID, Bacteria ENTEROCOCCUS SPECIES   Final  CULTURE, BLOOD (ROUTINE X 2)     Status: None   Collection Time    06/08/13  2:00 PM      Result Value Range Status   Specimen Description BLOOD LEFT HAND   Final   Special Requests BOTTLES DRAWN AEROBIC ONLY 3CC   Final   Culture  Setup Time 06/09/2013 00:51   Final   Culture     Final   Value:        BLOOD CULTURE RECEIVED NO GROWTH TO DATE CULTURE WILL BE HELD FOR 5 DAYS BEFORE ISSUING A FINAL NEGATIVE REPORT   Report Status PENDING   Incomplete  CULTURE, BLOOD (ROUTINE X 2)     Status: None   Collection Time    06/08/13  2:05 PM      Result Value Range Status   Specimen Description BLOOD LEFT ARM   Final   Special  Requests BOTTLES DRAWN AEROBIC AND ANAEROBIC 5CC   Final   Culture  Setup Time 06/09/2013 00:51   Final   Culture     Final   Value:        BLOOD CULTURE RECEIVED NO GROWTH TO DATE CULTURE WILL BE HELD FOR 5 DAYS BEFORE ISSUING A FINAL NEGATIVE REPORT   Report Status PENDING   Incomplete  CSF CULTURE     Status: None   Collection Time    06/08/13  6:22 PM      Result Value Range Status   Specimen Description CSF   Final   Special Requests Normal   Final   Gram Stain     Final   Value: NO WBC SEEN     NO ORGANISMS SEEN     CYTOSPIN   Culture NO GROWTH 1 DAY   Final   Report Status PENDING   Incomplete  GRAM STAIN     Status: None   Collection Time    06/08/13  6:24 PM      Result Value Range Status   Specimen Description CSF   Final   Special Requests Normal   Final   Gram Stain     Final   Value: NO ORGANISMS SEEN     NO WBC SEEN     CYTOSPIN SAMPLE     Gram Stain Report Called to,Read Back By and Verified With: NEASE,A. AT 1948 ON 07.23.14 BY LOVE,T.   Report Status 06/08/2013 FINAL   Final    Anti-infectives   Start     Dose/Rate Route Frequency Ordered Stop   06/11/13 0800  vancomycin (VANCOCIN) IVPB 1000 mg/200 mL premix     1,000 mg 200 mL/hr over 60 Minutes Intravenous Every 12 hours 06/10/13 2202     06/09/13 1000  vancomycin (VANCOCIN) IVPB 750 mg/150 ml premix  Status:  Discontinued     750 mg 150 mL/hr over 60 Minutes Intravenous Every 12 hours 06/08/13 2156 06/10/13 2201   06/09/13 0600  ceFEPIme (MAXIPIME) 1 g in dextrose 5 % 50 mL  IVPB     1 g 100 mL/hr over 30 Minutes Intravenous 3 times per day 06/08/13 2130 06/17/13 0559   06/08/13 2230  vancomycin (VANCOCIN) 1,500 mg in sodium chloride 0.9 % 500 mL IVPB     1,500 mg 250 mL/hr over 120 Minutes Intravenous  Once 06/08/13 2156 06/09/13 0102   06/08/13 2015  piperacillin-tazobactam (ZOSYN) IVPB 3.375 g     3.375 g 100 mL/hr over 30 Minutes Intravenous  Once 06/08/13 2002 06/08/13 2201   06/08/13 1700   cefTRIAXone (ROCEPHIN) 2 g in dextrose 5 % 50 mL IVPB     2 g 100 mL/hr over 30 Minutes Intravenous  Once 06/08/13 1636 06/08/13 1757      Assessment: 89 yoM admitted with AMS and fever. Recently hospitalized for toxic metabolic encephalopathy and urosepsis. Hx chronic intermittent UTIs. Toxic encepahlopathy with questionable infectious etiology and considering HCAP given recent hospitalization. Started on vancomycin per pharmacy and cefepime for HCAP. 7/23 urine culture growing enterococcus species.   Wt: 75.5kg  Renal function appears stable. SCr 0.70, CrCl ~60 ml/min, N 58.  Tm24h: 100.6  WBC improved, now WNL Cultures:  7/23: 2/2 Blood: NGTD 7/23: Urine: >100,000 enterococcus species 7/23: CSF: gram stain normal, glucose WNL, protein WNL, elevated RBCs, few WBCs. No organisms seen. Cx no growth 1 day.   Goal of Therapy:  Vancomycin trough level 15-20 mcg/ml  Plan:  1. Increase Vancomycin 1000 mg IV q 12 hours since the patient is elderly and Cefepime 1 Gm IV q8h 2. F/u culture results and sensitivities.   Loletta Specter 06/10/2013,10:05 PM

## 2013-06-11 LAB — BASIC METABOLIC PANEL
GFR calc Af Amer: >90 mL/min (ref >90)
GFR calc non Af Amer: 81 mL/min — ABNORMAL LOW (ref >90)
Potassium: 3.6 mEq/L (ref 3.5–5.1)
Sodium: 135 mEq/L (ref 135–145)

## 2013-06-11 LAB — CBC
MCHC: 33 g/dL (ref 30.0–36.0)
RDW: 13.7 % (ref 11.5–15.5)

## 2013-06-11 NOTE — Progress Notes (Signed)
Patient ID: Ricky [name], male   DOB: March 05, 1917, 79 y.o.   MRN: [id number]  [facility] PROGRESS NOTE  Ricky [name] [id number]:[id number] DOB: 1917-01-08 DOA: 06/08/2013 PCP: Bufford Spikes, DO  Brief narrative:  79 y.o. male who was recently discharged from the hospital 05/19/2013 after he was hospitalized for toxic metabolic encephalopathy secondary to sepsis due to Serratia and MRSA urinary tract infection. Patient was discharged home with antibiotic Bactrim to complete therapy for 5 days and per his son, he has completed therapy. Please note that patient was unable to provide history at the time of the admission and son who was present at bedside provided the information. Per son, patient has been doing well post discharge, but has shown signs of sudden deterioration, noted several days prior to this admission, unable to take anything by mouth, mostly bedbound, non-ambulatory, associated with fevers as high as 103 Fahrenheit, productive cough of yellow sputum, labored breathing even with minimal exertion. Please note that review of records indicate that urologist Dr. Brunilda Payor has changed Foley catheter on 05/15/2013. In the emergency department, lumbar puncture was performed, showing RBC CSF 60 daily, BC 0, lymphocytes 2 view to count, Gram stain no organisms seen. Tmax recorded 103 Fahrenheit. CT scan of the head showed stable atrophy and white matter disease with no acute intracranial abnormalities. Chest x-ray did not reveal source of fever , acute left 8th rib fracture noted.   Principal Problem:  Toxic encephalopathy, ? Infectious  - This appears to be multifactorial in etiology, possibly infectious, questionable hospital-acquired pneumonia given recent hospitalization, progressive failure to thrive and deconditioning  - continuing broad-spectrum antibiotic coverage to include hospital acquired pneumonia, vancomycin and Maxipime day #4 - plan on narrowing the ABX regimen in AM - Urine culture  with enterococcus sensitive to Vancomycin   - Continue supportive care with oxygen, urine Legionella and strep pneumo analysis negative   - Continue Tylenol as needed for fever, WBC is within normal limits this AM  Active Problems:  Acute blood loss anemia  - slight drop in Hg since admission and likely dilutional, no signs of active bleed, overall stable  - repeat CBC in AM  Hypokalemia  - within normal limits this AM - repeat BMP in AM  Thrombocytopenia  - slight drop in Plt since admission, no signs of active bleed  - close monitor for now and continue Heparin SQ for DVT prophylaxis  HYPOTHYROIDISM  - Continue Synthroid as per home medical regimen  ALZHEIMERS DISEASE  - Progressive, patient unable to answer questions, does not follow commands on exam  PULMONARY EMBOLISM, HX OF  - Patient not anticoagulation candidate due to the high risk of fall  FTT (failure to thrive) in adult  - Encourage by mouth intake, speech and swallow evaluation ordered, will followup on results  Hypertension, accelerated  - Continue hydralazine, patient responding well and blood pressure better controlled   Consultants:  None Procedures/Studies:  Ct Head Wo Contrast 06/08/2013 Stable atrophy and white matter disease. No acute intracranial abnormality. Bilateral sphenoid sinus disease.  Dg Chest Port 1 View 06/08/2013 No source for fever is identified. Acute left eighth rib fracture. Cardiomegaly.  Dg Fluoro Guide Lumbar Puncture 06/08/2013 Successful L3-4 lumbar puncture as noted above.  Antibiotics:  Vancomycin 07/23 -->  Maxipime 07/23 -->  Code Status: Full  Family Communication: Son at bedside over one hour  Disposition Plan: Home when medically stable    HPI/Subjective: No events overnight.   Objective: Filed Vitals:  06/10/13 1040 06/10/13 1254 06/10/13 2141 06/11/13 0648  BP: 142/69 149/78 167/83 140/87  Pulse: 62 72 72 72  Temp:  98.2 F (36.8 C) 99.9 F (37.7 C) 98.7 F (37.1 C)   TempSrc:  Axillary Oral Oral  Resp:  22 20 24   Height:      Weight:      SpO2:  99% 97% 96%    Intake/Output Summary (Last 24 hours) at 06/11/13 1144 Last data filed at 06/11/13 0935  Gross per 24 hour  Intake 1530.83 ml  Output   1525 ml  Net   5.83 ml    Exam:   General:  Pt is more alert, follows some commands appropriately, not in acute distress  Cardiovascular: Regular rate and rhythm, S1/S2, no murmurs, no rubs, no gallops  Respiratory: Clear to auscultation bilaterally, no wheezing, decreased breath sounds at bases   Abdomen: Soft, non tender, non distended, bowel sounds present, no guarding  Extremities: No edema, pulses DP and PT palpable bilaterally   Data Reviewed: Basic Metabolic Panel:  Recent Labs Lab 06/08/13 1405 06/10/13 0400 06/11/13 0420  NA 134* 137 135  K 4.0 3.3* 3.6  CL 97 103 102  CO2 25 24 24   GLUCOSE 106* 89 87  BUN 8 8 9   CREATININE 0.79 0.70 0.66  CALCIUM 9.7 8.7 8.9   CBC:  Recent Labs Lab 06/08/13 1405 06/10/13 0400 06/11/13 0420  WBC 12.1* 6.0 4.7  NEUTROABS 10.6*  --   --   HGB 12.0* 10.6* 10.5*  HCT 37.0* 32.9* 31.8*  MCV 87.9 88.0 86.9  PLT 181 138* 146*    Recent Results (from the past 240 hour(s))  URINE CULTURE     Status: None   Collection Time    06/08/13  1:55 PM      Result Value Range Status   Specimen Description URINE, CATHETERIZED   Final   Special Requests NONE   Final   Culture  Setup Time 06/09/2013 00:20   Final   Colony Count >=100,000 COLONIES/ML   Final   Culture ENTEROCOCCUS SPECIES   Final   Report Status 06/10/2013 FINAL   Final   Organism ID, Bacteria ENTEROCOCCUS SPECIES   Final  CULTURE, BLOOD (ROUTINE X 2)     Status: None   Collection Time    06/08/13  2:00 PM      Result Value Range Status   Specimen Description BLOOD LEFT HAND   Final   Special Requests BOTTLES DRAWN AEROBIC ONLY 3CC   Final   Culture  Setup Time 06/09/2013 00:51   Final   Culture     Final   Value:         BLOOD CULTURE RECEIVED NO GROWTH TO DATE CULTURE WILL BE HELD FOR 5 DAYS BEFORE ISSUING A FINAL NEGATIVE REPORT   Report Status PENDING   Incomplete  CULTURE, BLOOD (ROUTINE X 2)     Status: None   Collection Time    06/08/13  2:05 PM      Result Value Range Status   Specimen Description BLOOD LEFT ARM   Final   Special Requests BOTTLES DRAWN AEROBIC AND ANAEROBIC 5CC   Final   Culture  Setup Time 06/09/2013 00:51   Final   Culture     Final   Value:        BLOOD CULTURE RECEIVED NO GROWTH TO DATE CULTURE WILL BE HELD FOR 5 DAYS BEFORE ISSUING A FINAL NEGATIVE REPORT   Report Status  PENDING   Incomplete  CSF CULTURE     Status: None   Collection Time    06/08/13  6:22 PM      Result Value Range Status   Specimen Description CSF   Final   Special Requests Normal   Final   Gram Stain     Final   Value: NO WBC SEEN     NO ORGANISMS SEEN     CYTOSPIN   Culture NO GROWTH 1 DAY   Final   Report Status PENDING   Incomplete  GRAM STAIN     Status: None   Collection Time    06/08/13  6:24 PM      Result Value Range Status   Specimen Description CSF   Final   Special Requests Normal   Final   Gram Stain     Final   Value: NO ORGANISMS SEEN     NO WBC SEEN     CYTOSPIN SAMPLE     Gram Stain Report Called to,Read Back By and Verified With: NEASE,A. AT 1948 ON 07.23.14 BY LOVE,T.   Report Status 06/08/2013 FINAL   Final     Scheduled Meds: . ceFEPime (MAXIPIME) IV  1 g Intravenous Q8H  . feeding supplement  1 Container Oral BID BM  . heparin  5,000 Units Subcutaneous Q8H  . hydrALAZINE  25 mg Oral BID  . levothyroxine  12.5 mcg Intravenous QAC breakfast  . vancomycin  1,000 mg Intravenous Q12H   Continuous Infusions: . sodium chloride 50 mL/hr (06/11/13 0736)   Debbora Presto, MD  [facility] Pager [phone]  If 7PM-7AM, please contact night-coverage [email or web address] Password [facility] 06/11/2013, 11:44 AM   LOS: 3 days

## 2013-06-11 NOTE — Progress Notes (Signed)
PT Cancellation Note  Patient Details Name: Ricky [name] MRN: [id number] DOB: [birth year]   Cancelled Treatment:    Reason Eval/Treat Not Completed: Patient politely declined, stating he did not even feel like sitting EOB today. Really tried to encourage pt to at least begin to move sit edge of bed and or stand, however pt continued to decline. Will check back tomorrow. No family present.    Marella Bile 06/11/2013, 3:31 PM Marella Bile, PT Pager: [phone] 06/11/2013

## 2013-06-12 LAB — CSF CULTURE W GRAM STAIN
Culture: NO GROWTH
Gram Stain: NONE SEEN
Special Requests: NORMAL

## 2013-06-12 LAB — CBC
Hemoglobin: 10.7 g/dL — ABNORMAL LOW (ref 13.0–17.0)
RBC: 3.83 MIL/uL — ABNORMAL LOW (ref 4.22–5.81)
WBC: 5.2 10*3/uL (ref 4.0–10.5)

## 2013-06-12 LAB — BASIC METABOLIC PANEL
CO2: 25 mEq/L (ref 19–32)
GFR calc non Af Amer: 82 mL/min — ABNORMAL LOW (ref >90)
Glucose, Bld: 88 mg/dL (ref 70–99)
Potassium: 3.2 mEq/L — ABNORMAL LOW (ref 3.5–5.1)
Sodium: 136 mEq/L (ref 135–145)

## 2013-06-12 MED ORDER — LIP MEDEX EX OINT
TOPICAL_OINTMENT | CUTANEOUS | Status: DC | PRN
  Filled 2013-06-12: qty 7

## 2013-06-12 MED ORDER — POTASSIUM CHLORIDE CRYS ER 20 MEQ PO TBCR
40.0000 meq | EXTENDED_RELEASE_TABLET | Freq: Once | ORAL | Status: AC
  Administered 2013-06-12: 40 meq via ORAL
  Filled 2013-06-12: qty 2

## 2013-06-12 MED ORDER — HYDRALAZINE HCL 25 MG PO TABS
25.0000 mg | ORAL_TABLET | Freq: Three times a day (TID) | ORAL | Status: DC
  Administered 2013-06-12 – 2013-06-17 (×15): 25 mg via ORAL
  Filled 2013-06-12 (×18): qty 1

## 2013-06-12 NOTE — Evaluation (Signed)
Physical Therapy Evaluation Patient Details Name: Ricky [name] MRN: [id number] DOB: 1917/11/07 Today's Date: 06/12/2013 Time: 4098-1191 PT Time Calculation (min): 55 min  PT Assessment / Plan / Recommendation History of Present Illness  79 y.o. male who was recently discharged from the hospital 05/19/2013 after he was hospitalized for toxic metabolic encephalopathy secondary to sepsis due to Serratia and MRSA urinary tract infection. Patient was discharged home with antibiotic Bactrim to complete therapy for 5 days and per his son, he has completed therapy. Please note that patient was unable to provide history at the time of the admission and son who was present at bedside provided the information. Per son, patient has been doing well post discharge, but has shown signs of sudden deterioration, noted several days prior to this admission, unable to take anything by mouth, mostly bedbound, non-ambulatory, associated with fevers as high as 103 Fahrenheit, productive cough of yellow sputum, labored breathing even with minimal exertion. Please note that review of records indicate that urologist Dr. Brunilda Payor has changed Foley catheter on 05/15/2013. In the emergency department, lumbar puncture was performed, showing RBC CSF 60 daily, BC 0, lymphocytes 2 view to count, Gram stain no organisms seen. Tmax recorded 103 Fahrenheit. CT scan of the head showed stable atrophy and white matter disease with no acute intracranial abnormalities. Chest x-ray did not reveal source of fever , acute left 8th rib fracture noted.  Clinical Impression  Pt with great amount of flexed /rigid posture. Unsure of pt's PLOF due to family not present , however in chart pt was performing more assisted mobility, transfers and standing when hospitalized earlier this month. Today pt with flexed upper body and LE posturing even in sitting EOB. Performed sitting EOB for about 15 minutes, with the patient alert however not able to actively  initiate movement. Used rocking technique to see if some flexion would relax with slight improvement. Also tried rolling in supine to see if some flexed trunk posture would release. We were able to get LEs to neutral position, but cervical remained in flexion position. Presents with rigid posture, decreased strength and decreased ability with mobility. To benefit from PT to work with patient dn family for education to assist with safe transfer home, ROM program, and transfers safety.     PT Assessment  Patient needs continued PT services    Follow Up Recommendations  Home health PT (or SNF if family unable to provide total assist 24/7)    Does the patient have the potential to tolerate intense rehabilitation      Barriers to Discharge        Equipment Recommendations   (unsure of what family may already have (?))    Recommendations for Other Services     Frequency Min 2X/week    Precautions / Restrictions Precautions Precautions: Fall Required Braces or Orthoses: Other Brace/Splint Other Brace/Splint: pt had on B PRAFO, family not present so unsure if pt wears all the time or not. removed to do skin check and pt's skin looks in good shape. No boggy areas noted.    Pertinent Vitals/Pain Denies any pain until grimaces when trying to assist with releasing flexed posture.      Mobility  Bed Mobility Bed Mobility: Supine to Sit;Sitting - Scoot to Edge of Bed;Sit to Supine Supine to Sit: 1: +2 Total assist;HOB elevated Supine to Sit: Patient Percentage: 10% Sitting - Scoot to Edge of Bed: 1: +2 Total assist Sitting - Scoot to Edge of Bed: Patient Percentage: 0%  Sit to Supine: 1: +2 Total assist;HOB flat Sit to Supine: Patient Percentage: 0% Details for Bed Mobility Assistance: pt in very rigid flexed posture with B UES flexed at elbows to 90 degrees, hip and knees about 40 degrees and remained in this postion for sitting. Pt did initiate RLE off bed to assist with supine to sit, but  that was all the initiation of movment there. Did stay alert and eyes open during session .  Transfers Transfers: Not assessed (unable due to pt's rigid posturing) Ambulation/Gait Ambulation/Gait Assistance: Not tested (comment)    Exercises     PT Diagnosis: Generalized weakness  PT Problem List: Decreased range of motion;Decreased strength;Decreased activity tolerance;Impaired tone;Decreased mobility PT Treatment Interventions: Functional mobility training;Therapeutic activities;Therapeutic exercise;Neuromuscular re-education;Patient/family education     PT Goals(Current goals can be found in the care plan section) Acute Rehab PT Goals Patient Stated Goal: pt did not participate with goals PT Goal Formulation: Patient unable to participate in goal setting Time For Goal Achievement: 06/26/13 Potential to Achieve Goals: Fair  Visit Information  Last PT Received On: 06/12/13 Assistance Needed: +2 History of Present Illness: 79 y.o. male who was recently discharged from the hospital 05/19/2013 after he was hospitalized for toxic metabolic encephalopathy secondary to sepsis due to Serratia and MRSA urinary tract infection. Patient was discharged home with antibiotic Bactrim to complete therapy for 5 days and per his son, he has completed therapy. Please note that patient was unable to provide history at the time of the admission and son who was present at bedside provided the information. Per son, patient has been doing well post discharge, but has shown signs of sudden deterioration, noted several days prior to this admission, unable to take anything by mouth, mostly bedbound, non-ambulatory, associated with fevers as high as 103 Fahrenheit, productive cough of yellow sputum, labored breathing even with minimal exertion. Please note that review of records indicate that urologist Dr. Brunilda Payor has changed Foley catheter on 05/15/2013. In the emergency department, lumbar puncture was performed,  showing RBC CSF 60 daily, BC 0, lymphocytes 2 view to count, Gram stain no organisms seen. Tmax recorded 103 Fahrenheit. CT scan of the head showed stable atrophy and white matter disease with no acute intracranial abnormalities. Chest x-ray did not reveal source of fever , acute left 8th rib fracture noted.       Prior Functioning  Home Living Family/patient expects to be discharged to:: Private residence Living Arrangements: Children Additional Comments: unable to obtain history or prior level of function from the patient and no caregiver available. Prior Function Level of Independence: Needs assistance Gait / Transfers Assistance Needed: pt reports son walks with him Comments: unclear of PLOF due to no family present, adn pt not clear with questions asked.  Communication Communication: HOH (answers with one word comments, or none )    Cognition  Cognition Arousal/Alertness: Awake/alert Behavior During Therapy: Flat affect Overall Cognitive Status: No family/caregiver present to determine baseline cognitive functioning    Extremity/Trunk Assessment Upper Extremity Assessment Upper Extremity Assessment: Difficult to assess due to impaired cognition (pt in very flexed posture with B elbows flexed ,) Lower Extremity Assessment Lower Extremity Assessment: Difficult to assess due to impaired cognition (could not get him to actively respond to commands for LEs)   Balance Static Sitting Balance Static Sitting - Balance Support: No upper extremity supported (pt was unabel to extend at elbows enough to assist with UEs ) Static Sitting - Level of Assistance: 3: Mod assist (Min  a at times) Static Sitting - Comment/# of Minutes: no self reaction to any LOB, flexxed at hips, and required gentle rocking to get pt to relax at hips to get feet on floor in sitting. unable to get him to use with UEs for assisting, kept crossed at front of body. cervical flexion and trunk flexion. with on intiation for  neck extenion to look up, even with stimulation/tapping.   End of Session PT - End of Session Activity Tolerance: Patient tolerated treatment well Patient left: in bed;with call bell/phone within reach Nurse Communication: Need for lift equipment  GP     Marella Bile 06/12/2013, 1:31 PM Marella Bile, PT Pager: [phone] 06/12/2013

## 2013-06-12 NOTE — Progress Notes (Signed)
Patient ID: Ricky [name], male   DOB: May 20, 1917, 79 y.o.   MRN: [id number]  [facility] PROGRESS NOTE  Ricky [name] [id number]:[id number] DOB: 1917/09/11 DOA: 06/08/2013 PCP: Bufford Spikes, DO  Brief narrative:  79 y.o. male who was recently discharged from the hospital 05/19/2013 after he was hospitalized for toxic metabolic encephalopathy secondary to sepsis due to Serratia and MRSA urinary tract infection. Patient was discharged home with antibiotic Bactrim to complete therapy for 5 days and per his son, he has completed therapy. Please note that patient was unable to provide history at the time of the admission and son who was present at bedside provided the information. Per son, patient has been doing well post discharge, but has shown signs of sudden deterioration, noted several days prior to this admission, unable to take anything by mouth, mostly bedbound, non-ambulatory, associated with fevers as high as 103 Fahrenheit, productive cough of yellow sputum, labored breathing even with minimal exertion. Please note that review of records indicate that urologist Dr. Brunilda Payor has changed Foley catheter on 05/15/2013. In the emergency department, lumbar puncture was performed, showing RBC CSF 60 daily, BC 0, lymphocytes 2 view to count, Gram stain no organisms seen. Tmax recorded 103 Fahrenheit. CT scan of the head showed stable atrophy and white matter disease with no acute intracranial abnormalities. Chest x-ray did not reveal source of fever , acute left 8th rib fracture noted.   Principal Problem:  Toxic encephalopathy, ? Infectious  - This appears to be multifactorial in etiology, possibly infectious, questionable hospital-acquired pneumonia given recent hospitalization, progressive failure to thrive and deconditioning  - continuing broad-spectrum antibiotic coverage to include hospital acquired pneumonia, vancomycin and Maxipime day #5  - Urine culture with enterococcus sensitive to Vancomycin  and pt currently on day #5 of Vancomycin, plan on discontinuing Maxipime  - Continue supportive care with oxygen, urine Legionella and strep pneumo analysis negative  - Continue Tylenol as needed for fever, WBC is within normal limits this AM  Active Problems:  Acute blood loss anemia  - slight drop in Hg since admission and likely dilutional, no signs of active bleed, overall stable  Hypokalemia  - mild, will supplement this AM - repeat BMP in AM  Thrombocytopenia  - slight drop in Plt since admission, no signs of active bleed  - close monitor for now and continue Heparin SQ for DVT prophylaxis  HYPOTHYROIDISM  - Continue Synthroid as per home medical regimen  ALZHEIMERS DISEASE  - Progressive, patient unable to answer questions, does not follow commands on exam  - PT declined PT this AM PULMONARY EMBOLISM, HX OF  - Patient not anticoagulation candidate due to the high risk of fall  FTT (failure to thrive) in adult  - Encourage by mouth intake, speech and swallow evaluation ordered, will followup on results  Hypertension, accelerated  - Continue hydralazine, we will monitor closely and plan on readjusting the regimen as indicated   Consultants:  None Procedures/Studies:  Ct Head Wo Contrast 06/08/2013 Stable atrophy and white matter disease. No acute intracranial abnormality. Bilateral sphenoid sinus disease.  Dg Chest Port 1 View 06/08/2013 No source for fever is identified. Acute left eighth rib fracture. Cardiomegaly.  Dg Fluoro Guide Lumbar Puncture 06/08/2013 Successful L3-4 lumbar puncture as noted above.  Antibiotics:  Vancomycin 07/23 -->  Maxipime 07/23 --> 06/12/2013  Code Status: Full  Family Communication: Pt himself as he is more alert this AM, no son at bedside Disposition Plan: Home when medically stable  HPI/Subjective: No events overnight.   Objective: Filed Vitals:   06/11/13 0648 06/11/13 1303 06/11/13 2152 06/12/13 0502  BP: 140/87 170/87 163/89 167/84   Pulse: 72 77 70 78  Temp: 98.7 F (37.1 C) 97.8 F (36.6 C) 97.1 F (36.2 C) 98 F (36.7 C)  TempSrc: Oral Axillary Axillary Axillary  Resp: 24 20 20 19   Height:      Weight:      SpO2: 96% 96% 95% 98%    Intake/Output Summary (Last 24 hours) at 06/12/13 1211 Last data filed at 06/12/13 0542  Gross per 24 hour  Intake    770 ml  Output   1950 ml  Net  -1180 ml    Exam:   General:  Pt is more alert, follows commands appropriately, not in acute distress, still somewhat confused  Cardiovascular: Regular rate and rhythm, S1/S2, no murmurs, no rubs, no gallops  Respiratory: Clear to auscultation bilaterally, no wheezing, decreased breath sounds at bases   Abdomen: Soft, non tender, non distended, bowel sounds present, no guarding  Extremities: No edema, pulses DP and PT palpable bilaterally  Data Reviewed: Basic Metabolic Panel:  Recent Labs Lab 06/08/13 1405 06/10/13 0400 06/11/13 0420 06/12/13 0432  NA 134* 137 135 136  K 4.0 3.3* 3.6 3.2*  CL 97 103 102 101  CO2 25 24 24 25   GLUCOSE 106* 89 87 88  BUN 8 8 9 9   CREATININE 0.79 0.70 0.66 0.63  CALCIUM 9.7 8.7 8.9 9.0   CBC:  Recent Labs Lab 06/08/13 1405 06/10/13 0400 06/11/13 0420 06/12/13 0432  WBC 12.1* 6.0 4.7 5.2  NEUTROABS 10.6*  --   --   --   HGB 12.0* 10.6* 10.5* 10.7*  HCT 37.0* 32.9* 31.8* 33.1*  MCV 87.9 88.0 86.9 86.4  PLT 181 138* 146* 156    Recent Results (from the past 240 hour(s))  URINE CULTURE     Status: None   Collection Time    06/08/13  1:55 PM      Result Value Range Status   Specimen Description URINE, CATHETERIZED   Final   Special Requests NONE   Final   Culture  Setup Time 06/09/2013 00:20   Final   Colony Count >=100,000 COLONIES/ML   Final   Culture ENTEROCOCCUS SPECIES   Final   Report Status 06/10/2013 FINAL   Final   Organism ID, Bacteria ENTEROCOCCUS SPECIES   Final  CULTURE, BLOOD (ROUTINE X 2)     Status: None   Collection Time    06/08/13  2:00 PM       Result Value Range Status   Specimen Description BLOOD LEFT HAND   Final   Special Requests BOTTLES DRAWN AEROBIC ONLY 3CC   Final   Culture  Setup Time 06/09/2013 00:51   Final   Culture     Final   Value:        BLOOD CULTURE RECEIVED NO GROWTH TO DATE CULTURE WILL BE HELD FOR 5 DAYS BEFORE ISSUING A FINAL NEGATIVE REPORT   Report Status PENDING   Incomplete  CULTURE, BLOOD (ROUTINE X 2)     Status: None   Collection Time    06/08/13  2:05 PM      Result Value Range Status   Specimen Description BLOOD LEFT ARM   Final   Special Requests BOTTLES DRAWN AEROBIC AND ANAEROBIC 5CC   Final   Culture  Setup Time 06/09/2013 00:51   Final   Culture  Final   Value:        BLOOD CULTURE RECEIVED NO GROWTH TO DATE CULTURE WILL BE HELD FOR 5 DAYS BEFORE ISSUING A FINAL NEGATIVE REPORT   Report Status PENDING   Incomplete  CSF CULTURE     Status: None   Collection Time    06/08/13  6:22 PM      Result Value Range Status   Specimen Description CSF   Final   Special Requests Normal   Final   Gram Stain     Final   Value: NO WBC SEEN     NO ORGANISMS SEEN     CYTOSPIN   Culture NO GROWTH 2 DAYS   Final   Report Status PENDING   Incomplete  GRAM STAIN     Status: None   Collection Time    06/08/13  6:24 PM      Result Value Range Status   Specimen Description CSF   Final   Special Requests Normal   Final   Gram Stain     Final   Value: NO ORGANISMS SEEN     NO WBC SEEN     CYTOSPIN SAMPLE     Gram Stain Report Called to,Read Back By and Verified With: NEASE,A. AT 1948 ON 07.23.14 BY LOVE,T.   Report Status 06/08/2013 FINAL   Final     Scheduled Meds: . ceFEPime (MAXIPIME) IV  1 g Intravenous Q8H  . feeding supplement  1 Container Oral BID BM  . heparin  5,000 Units Subcutaneous Q8H  . hydrALAZINE  25 mg Oral BID  . levothyroxine  12.5 mcg Intravenous QAC breakfast  . vancomycin  1,000 mg Intravenous Q12H   Continuous Infusions:    Debbora Presto, MD  [facility] Pager  [phone]  If 7PM-7AM, please contact night-coverage [email or web address] Password [email or web address] 06/12/2013, 12:11 PM   LOS: 4 days

## 2013-06-13 ENCOUNTER — Telehealth: Payer: Self-pay | Admitting: Internal Medicine

## 2013-06-13 ENCOUNTER — Ambulatory Visit: Payer: Self-pay | Admitting: Internal Medicine

## 2013-06-13 DIAGNOSIS — K5900 Constipation, unspecified: Secondary | ICD-10-CM

## 2013-06-13 DIAGNOSIS — N400 Enlarged prostate without lower urinary tract symptoms: Secondary | ICD-10-CM

## 2013-06-13 LAB — BASIC METABOLIC PANEL
BUN: 13 mg/dL (ref 6–23)
Chloride: 103 mEq/L (ref 96–112)
GFR calc Af Amer: >90 mL/min (ref >90)
Glucose, Bld: 99 mg/dL (ref 70–99)
Potassium: 3.2 mEq/L — ABNORMAL LOW (ref 3.5–5.1)

## 2013-06-13 MED ORDER — POTASSIUM CHLORIDE CRYS ER 20 MEQ PO TBCR
40.0000 meq | EXTENDED_RELEASE_TABLET | Freq: Once | ORAL | Status: AC
  Administered 2013-06-13: 40 meq via ORAL
  Filled 2013-06-13: qty 2

## 2013-06-13 NOTE — Telephone Encounter (Signed)
New prob  Pt son wants to let Dr Tenny Craw know that he is Ricky [name] and has been there since Wednesday with an infection.

## 2013-06-13 NOTE — Progress Notes (Signed)
Discontinue Isolation Precautions per Velva Harman Infection Prevention.

## 2013-06-13 NOTE — Telephone Encounter (Signed)
Pt's son called to let DR. Tenny Craw know that pt is in [facility] with an infection. On July 23 rd pt wake up with a high fever of 103, he has been in the hospital since then. Drs have R/O URI, but do not know what he got.  Prior this hospitalization pt got hospital aquired  Pneumonia. Pt has been in two kinds of wide spectrum antibiotics; now he is in only one antibiotic. Pt has improved now. Pt is in room 1402.

## 2013-06-13 NOTE — Progress Notes (Signed)
Physical Therapy Treatment Patient Details Name: Ricky [name] MRN: [id number] DOB: [birth year] Today's Date: 06/13/2013 Time: 1345-1410 PT Time Calculation (min): 25 min  PT Assessment / Plan / Recommendation  History of Present Illness     Clinical Impression    PT Comments   Pt in bed slow to respond.  Required + 2 total assist to transfer from supine to EOB.  Performed stand pivot "bear hug" transfer from bed to recliner with + 2 total assist. Pt unable to take functional steps or support self in standing.    Follow Up Recommendations        Does the patient have the potential to tolerate intense rehabilitation     Barriers to Discharge        Equipment Recommendations       Recommendations for Other Services    Frequency Min 2X/week   Progress towards PT Goals    Plan Current plan remains appropriate    Precautions / Restrictions Precautions Precautions: Fall Required Braces or Orthoses: Other Brace/Splint Other Brace/Splint: pt had on B PRAFO Restrictions Weight Bearing Restrictions: No    Pertinent Vitals/Pain No c/o pain    Mobility  Bed Mobility Bed Mobility: Supine to Sit;Sitting - Scoot to Edge of Bed;Sit to Supine Supine to Sit: 1: +2 Total assist;HOB elevated Supine to Sit: Patient Percentage: 10% Sitting - Scoot to Edge of Bed: 1: +2 Total assist Sitting - Scoot to Edge of Bed: Patient Percentage: 0% Details for Bed Mobility Assistance: pt in very rigid flexed posture with B UES flexed at elbows to 90 degrees, hip and knees about 40 degrees and remained in this postion for sitting. Pt did initiate RLE off bed to assist with supine to sit, but that was all the initiation of movment there. Did stay alert and eyes open during session . Once upright, pt was able to sit EOB at Supervision level but with very forward flex posture and head positioned down with inability to correct upright.  Transfers Transfers: Editor, commissioning Transfers: 1:  +2 Total assist Stand Pivot Transfers: Patient Percentage: 10% Details for Transfer Assistance: Pt unable to self assist to rise but once up was able to WB thru B LE's.  Unable to weight shift and pivot 1/4 turn to recliner, required 100% assist.  Unable to control decend, required 100% assist.  Pt very rigid throughout. Ambulation/Gait Ambulation/Gait Assistance Details: unable to take functional steps during transfer.      PT Goals (current goals can now be found in the care plan section)    Visit Information  Last PT Received On: 06/13/13 Assistance Needed: +2    Subjective Data      Cognition       Balance     End of Session PT - End of Session Equipment Utilized During Treatment: Gait belt Activity Tolerance: Treatment limited secondary to medical complications (Comment) (Cognition) Nurse Communication: Need for lift equipment   Felecia Shelling  PTA [facility]  Acute  Rehab Pager      [phone]

## 2013-06-13 NOTE — Progress Notes (Signed)
Patient ID: Ricky [name], male   DOB: 1917/03/12, 79 y.o.   MRN: [id number] [facility] PROGRESS NOTE  Ricky [name] WUJ:[id number] DOB: July 16, 1917 DOA: 06/08/2013 PCP: Bufford Spikes, DO  Brief narrative:  79 y.o. male who was recently discharged from the hospital 05/19/2013 after he was hospitalized for toxic metabolic encephalopathy secondary to sepsis due to Serratia and MRSA urinary tract infection. Patient was discharged home with antibiotic Bactrim to complete therapy for 5 days and per his son, he has completed therapy. Please note that patient was unable to provide history at the time of the admission and son who was present at bedside provided the information. Per son, patient has been doing well post discharge, but has shown signs of sudden deterioration, noted several days prior to this admission, unable to take anything by mouth, mostly bedbound, non-ambulatory, associated with fevers as high as 103 Fahrenheit, productive cough of yellow sputum, labored breathing even with minimal exertion. Please note that review of records indicate that urologist Dr. Brunilda Payor has changed Foley catheter on 05/15/2013. In the emergency department, lumbar puncture was performed, showing RBC CSF 60 daily, BC 0, lymphocytes 2 view to count, Gram stain no organisms seen. Tmax recorded 103 Fahrenheit. CT scan of the head showed stable atrophy and white matter disease with no acute intracranial abnormalities. Chest x-ray did not reveal source of fever , acute left 8th rib fracture noted.   Principal Problem:  Toxic encephalopathy, ? Infectious  - This appears to be multifactorial in etiology, possibly infectious, questionable hospital-acquired pneumonia given recent hospitalization, progressive failure to thrive and deconditioning  - continued broad-spectrum antibiotic coverage to include hospital acquired pneumonia, vancomycin and Maxipime for 5 days  - Urine culture with enterococcus sensitive to  Vancomycin so this ABX has been continued until 06/14/2013 but Maxipime has been discontinued 7/27 - Continue supportive care with oxygen, urine Legionella and strep pneumo analysis negative  - Continue Tylenol as needed for fever, WBC is within normal limits this AM  Active Problems:  Acute blood loss anemia  - slight drop in Hg since admission and likely dilutional, no signs of active bleed, overall stable  Hypokalemia  - mild, still low, will continue to supplement as indicated  - repeat BMP in AM  Thrombocytopenia  - slight drop in Plt since admission, no signs of active bleed  - close monitor for now and continue Heparin SQ for DVT prophylaxis  HYPOTHYROIDISM  - Continue Synthroid as per home medical regimen  ALZHEIMERS DISEASE  - Progressive, patient unable to answer questions, does not follow commands on exam  - PT while inpatient  PULMONARY EMBOLISM, HX OF  - Patient not anticoagulation candidate due to the high risk of fall  FTT (failure to thrive) in adult  - Encourage by mouth intake, speech and swallow evaluation ordered, will followup on results  Hypertension, accelerated  - Continue hydralazine, we will continue to monitor closely  Consultants:  None Procedures/Studies:  Ct Head Wo Contrast 06/08/2013 Stable atrophy and white matter disease. No acute intracranial abnormality. Bilateral sphenoid sinus disease.  Dg Chest Port 1 View 06/08/2013 No source for fever is identified. Acute left eighth rib fracture. Cardiomegaly.  Dg Fluoro Guide Lumbar Puncture 06/08/2013 Successful L3-4 lumbar puncture as noted above.  Antibiotics:  Vancomycin 07/23 --> 06/14/2013 Maxipime 07/23 --> 06/12/2013  Code Status: Full  Family Communication: Pt himself as he is more alert this AM, no son at bedside  Disposition Plan: Home when medically stable  HPI/Subjective: No events overnight.   Objective: Filed Vitals:   06/12/13 0502 06/12/13 1400 06/12/13 1954 06/13/13 0616  BP: 167/84  146/82 136/75 143/62  Pulse: 78 80 82 81  Temp: 98 F (36.7 C) 99.2 F (37.3 C) 98.8 F (37.1 C) 97.9 F (36.6 C)  TempSrc: Axillary Oral Axillary Axillary  Resp: 19 20 18 18   Height:      Weight:      SpO2: 98% 97% 96% 96%    Intake/Output Summary (Last 24 hours) at 06/13/13 1300 Last data filed at 06/13/13 0700  Gross per 24 hour  Intake   1160 ml  Output   1000 ml  Net    160 ml    Exam:   General:  Pt is more alert, follows commands appropriately, not in acute distress, minimally verbal   Cardiovascular: Regular rate and rhythm, S1/S2, no murmurs, no rubs, no gallops  Respiratory: Clear to auscultation bilaterally, no wheezing, decreased breath sounds at bases   Abdomen: Soft, non tender, non distended, bowel sounds present, no guarding  Extremities: No edema, pulses DP and PT palpable bilaterally  Data Reviewed: Basic Metabolic Panel:  Recent Labs Lab 06/08/13 1405 06/10/13 0400 06/11/13 0420 06/12/13 0432 06/13/13 0414  NA 134* 137 135 136 136  K 4.0 3.3* 3.6 3.2* 3.2*  CL 97 103 102 101 103  CO2 25 24 24 25 23   GLUCOSE 106* 89 87 88 99  BUN 8 8 9 9 13   CREATININE 0.79 0.70 0.66 0.63 0.72  CALCIUM 9.7 8.7 8.9 9.0 8.8   CBC:  Recent Labs Lab 06/08/13 1405 06/10/13 0400 06/11/13 0420 06/12/13 0432  WBC 12.1* 6.0 4.7 5.2  NEUTROABS 10.6*  --   --   --   HGB 12.0* 10.6* 10.5* 10.7*  HCT 37.0* 32.9* 31.8* 33.1*  MCV 87.9 88.0 86.9 86.4  PLT 181 138* 146* 156   Recent Results (from the past 240 hour(s))  URINE CULTURE     Status: None   Collection Time    06/08/13  1:55 PM      Result Value Range Status   Specimen Description URINE, CATHETERIZED   Final   Special Requests NONE   Final   Culture  Setup Time 06/09/2013 00:20   Final   Colony Count >=100,000 COLONIES/ML   Final   Culture ENTEROCOCCUS SPECIES   Final   Report Status 06/10/2013 FINAL   Final   Organism ID, Bacteria ENTEROCOCCUS SPECIES   Final  CULTURE, BLOOD (ROUTINE X 2)      Status: None   Collection Time    06/08/13  2:00 PM      Result Value Range Status   Specimen Description BLOOD LEFT HAND   Final   Special Requests BOTTLES DRAWN AEROBIC ONLY 3CC   Final   Culture  Setup Time 06/09/2013 00:51   Final   Culture     Final   Value:        BLOOD CULTURE RECEIVED NO GROWTH TO DATE CULTURE WILL BE HELD FOR 5 DAYS BEFORE ISSUING A FINAL NEGATIVE REPORT   Report Status PENDING   Incomplete  CULTURE, BLOOD (ROUTINE X 2)     Status: None   Collection Time    06/08/13  2:05 PM      Result Value Range Status   Specimen Description BLOOD LEFT ARM   Final   Special Requests BOTTLES DRAWN AEROBIC AND ANAEROBIC 5CC   Final   Culture  Setup Time  06/09/2013 00:51   Final   Culture     Final   Value:        BLOOD CULTURE RECEIVED NO GROWTH TO DATE CULTURE WILL BE HELD FOR 5 DAYS BEFORE ISSUING A FINAL NEGATIVE REPORT   Report Status PENDING   Incomplete  CSF CULTURE     Status: None   Collection Time    06/08/13  6:22 PM      Result Value Range Status   Specimen Description CSF   Final   Special Requests Normal   Final   Gram Stain     Final   Value: NO WBC SEEN     NO ORGANISMS SEEN     CYTOSPIN   Culture NO GROWTH 3 DAYS   Final   Report Status 06/12/2013 FINAL   Final  GRAM STAIN     Status: None   Collection Time    06/08/13  6:24 PM      Result Value Range Status   Specimen Description CSF   Final   Special Requests Normal   Final   Gram Stain     Final   Value: NO ORGANISMS SEEN     NO WBC SEEN     CYTOSPIN SAMPLE     Gram Stain Report Called to,Read Back By and Verified With: NEASE,A. AT 1948 ON 07.23.14 BY LOVE,T.   Report Status 06/08/2013 FINAL   Final     Scheduled Meds: . feeding supplement  1 Container Oral BID BM  . heparin  5,000 Units Subcutaneous Q8H  . hydrALAZINE  25 mg Oral Q8H  . levothyroxine  12.5 mcg Intravenous QAC breakfast  . vancomycin  1,000 mg Intravenous Q12H   Continuous Infusions:    Debbora Presto,  MD  [facility] Pager [phone]  If 7PM-7AM, please contact night-coverage [email or web address] Password [email or web address] 06/13/2013, 1:00 PM   LOS: 5 days

## 2013-06-13 NOTE — Progress Notes (Signed)
Speech Language Pathology Dysphagia Treatment Patient Details Name: Ricky [name] MRN: [id number] DOB: 08-15-1917 Today's Date: 06/13/2013 Time: 9629-5284 SLP Time Calculation (min): 10 min  Assessment / Plan / Recommendation Clinical Impression  Skilled SLP intervention included tolerance of po diet and to determine benefit of compensatory strategies in place.  Rn report pt consumed approx 50% of his meal without signifcant difficulties.  Pt is afebrile with clear lung sounds per chart and WBC has come down.  Pt fed applesauce and Resource by this SLP with delayed swallow response but no overt s/s of aspiration.  No family present today to speak to re: pt's PSP and dysphagia or tolerance of intake premorbidly.  Rec continue puree/thin diet with strict aspiration precautions.  SLP to continue efforts to educate family.             Diet Recommendation  Continue with Current Diet: Dysphagia 1 (puree);Thin liquid    SLP Plan Continue with current plan of care   Pertinent Vitals/Pain Afebrile, decreased   Swallowing Goals  SLP Swallowing Goals Patient will utilize recommended strategies during swallow to increase swallowing safety with: Total assistance Swallow Study Goal #2 - Progress: Progressing toward goal Goal #3: Family will verbalize progression of dysphagia with PSP and possible diet modifications to mitigate aspiration risk with min assist.   General Temperature Spikes Noted: No Respiratory Status: Supplemental O2 delivered via (comment) Behavior/Cognition: Alert;Decreased sustained attention;Doesn't follow directions Oral Cavity - Dentition: Adequate natural dentition Patient Positioning: Upright in chair  Oral Cavity - Oral Hygiene     Dysphagia Treatment Treatment focused on: Skilled observation of diet tolerance;Patient/family/caregiver education Family/Caregiver Educated: pt, no family present Treatment Methods/Modalities: Skilled observation Patient observed directly  with PO's: Yes Type of PO's observed: Dysphagia 1 (puree);Thin liquids Feeding: Total assist Liquids provided via: Straw Oral Phase Signs & Symptoms: Prolonged bolus formation;Prolonged oral phase Pharyngeal Phase Signs & Symptoms: Suspected delayed swallow initiation;Audible swallow;Delayed throat clear Type of cueing: Verbal Amount of cueing: Minimal   GO     Donavan Burnet, MS [facility] SLP [phone]

## 2013-06-14 LAB — BASIC METABOLIC PANEL
BUN: 11 mg/dL (ref 6–23)
CO2: 24 mEq/L (ref 19–32)
Chloride: 104 mEq/L (ref 96–112)
GFR calc non Af Amer: 80 mL/min — ABNORMAL LOW (ref >90)
Glucose, Bld: 98 mg/dL (ref 70–99)
Potassium: 3.7 mEq/L (ref 3.5–5.1)

## 2013-06-14 LAB — CBC
HCT: 33.6 % — ABNORMAL LOW (ref 39.0–52.0)
Hemoglobin: 11.2 g/dL — ABNORMAL LOW (ref 13.0–17.0)
MCHC: 33.3 g/dL (ref 30.0–36.0)

## 2013-06-14 MED ORDER — ACETAMINOPHEN 325 MG PO TABS
650.0000 mg | ORAL_TABLET | ORAL | Status: DC | PRN
  Administered 2013-06-14: 650 mg via ORAL
  Filled 2013-06-14: qty 2

## 2013-06-14 MED ORDER — POTASSIUM CHLORIDE CRYS ER 20 MEQ PO TBCR
40.0000 meq | EXTENDED_RELEASE_TABLET | Freq: Once | ORAL | Status: AC
  Administered 2013-06-14: 40 meq via ORAL
  Filled 2013-06-14: qty 2

## 2013-06-14 MED ORDER — POTASSIUM CHLORIDE CRYS ER 20 MEQ PO TBCR: 20.0000 meq | EXTENDED_RELEASE_TABLET | Freq: Every day | ORAL | Status: DC

## 2013-06-14 NOTE — Discharge Summary (Signed)
Physician Discharge Summary  BLAYDEN CONWELL ZOX:[id number] DOB: Sep 27, 1917 DOA: 06/08/2013  PCP: Bufford Spikes, DO  Admit date: 06/08/2013 Discharge date: 06/14/2013  Recommendations for Outpatient Follow-up:  1. Pt will need to follow up with PCP in 2-3 weeks post discharge 2. Please obtain BMP to evaluate electrolytes and kidney function, potassium 3. Please note that potassium has been rather difficult to supplement while inpatient and still on low end of normal 4. Will prescribe K-dur 20 meq QD for pt to take once daily for 5 more days post discharge  5. Please also check CBC to evaluate Hg and Hct levels 6. Please note that pt has completed ABX treatment for enterococcus UTI while inpatient an no need for ABX upon discharge 7. Plan discussed with son and daughter over the phone in detail and both verbalized understanding but unable to pick up pt today so will plan on discharge in AM July 30th, 2014 at 10 am. Pt will have continuation of HH services upon discharge and family made aware.   Discharge Diagnoses: Acute encephalopathy secondary to UTI, ? PNA  Principal Problem:   Toxic encephalopathy, ? INfectious Active Problems:   HYPOTHYROIDISM   ALZHEIMERS DISEASE   Gastroparesis   PULMONARY EMBOLISM, HX OF   Parkinsonian syndrome   CVA (cerebral infarction)   FTT (failure to thrive) in adult   Neurological disease   Dysphagia   SIRS, ? Early- unknonw etiology  Discharge Condition: Stable  Diet recommendation: Dysphagia I diet discussed with son and daughter   Brief narrative:  79 y.o. male who was recently discharged from the hospital 05/19/2013 after he was hospitalized for toxic metabolic encephalopathy secondary to sepsis due to Serratia and MRSA urinary tract infection. Patient was discharged home with antibiotic Bactrim to complete therapy for 5 days and per his son, he has completed therapy. Please note that patient was unable to provide history at the time of the  admission and son who was present at bedside provided the information. Per son, patient has been doing well post discharge, but has shown signs of sudden deterioration, noted several days prior to this admission, unable to take anything by mouth, mostly bedbound, non-ambulatory, associated with fevers as high as 103 Fahrenheit, productive cough of yellow sputum, labored breathing even with minimal exertion. Please note that review of records indicate that urologist Dr. Brunilda Payor has changed Foley catheter on 05/15/2013. In the emergency department, lumbar puncture was performed, showing RBC CSF 60 daily, BC 0, lymphocytes 2 view to count, Gram stain no organisms seen. Tmax recorded 103 Fahrenheit. CT scan of the head showed stable atrophy and white matter disease with no acute intracranial abnormalities. Chest x-ray did not reveal source of fever , acute left 8th rib fracture noted.   Principal Problem:  Toxic encephalopathy, ? Infectious  - This appears to be multifactorial in etiology, possibly infectious, questionable hospital-acquired pneumonia given recent hospitalization, progressive failure to thrive and deconditioning  - continued broad-spectrum antibiotic coverage to include hospital acquired pneumonia, vancomycin and Maxipime for 5 days  - Urine culture with enterococcus sensitive to Vancomycin so this ABX has been continued until 06/14/2013 but Maxipime has been discontinued 7/27  - Continue supportive care with oxygen, urine Legionella and strep pneumo analysis negative  - Continue Tylenol as needed for fever, WBC is within normal limits  Active Problems:  Acute blood loss anemia  - slight drop in Hg since admission and likely dilutional, no signs of active bleed, overall stable  Hypokalemia  -  supplemented and within normal limits this AM - still on low end of normal and will prescribe K-dur 20 meq QD for pt to take once daily for 5 more days post discharge  Thrombocytopenia  - slight drop in  Plt since admission, no signs of active bleed inpatient  - Heparin was continued inpatient for DVT prophylaxis with no complications  HYPOTHYROIDISM  - Continue Synthroid as per home medical regimen  ALZHEIMERS DISEASE  - Progressive, patient unable to ambulate independently and requires assistance with all ADL PULMONARY EMBOLISM, HX OF  - Patient not anticoagulation candidate due to the high risk of fall  FTT (failure to thrive) in adult  - Encourage by mouth intake, speech and swallow evaluation ordered, dysphagia I diet recommended  Hypertension, accelerated  - Continue hydralazine  Consultants:  None Procedures/Studies:  Ct Head Wo Contrast 06/08/2013 Stable atrophy and white matter disease. No acute intracranial abnormality. Bilateral sphenoid sinus disease.  Dg Chest Port 1 View 06/08/2013 No source for fever is identified. Acute left eighth rib fracture. Cardiomegaly.  Dg Fluoro Guide Lumbar Puncture 06/08/2013 Successful L3-4 lumbar puncture as noted above.  Antibiotics:  Vancomycin 07/23 --> 06/14/2013  Maxipime 07/23 --> 06/12/2013  Code Status: Full  Family Communication: Pt himself as he is more alert this AM, no son at bedside but discussed with son and daughter over the phone   Discharge Exam: Filed Vitals:   06/14/13 0529  BP: 136/73  Pulse: 78  Temp: 98.7 F (37.1 C)  Resp: 20   Filed Vitals:   06/13/13 1354 06/13/13 1500 06/13/13 2129 06/14/13 0529  BP: 148/69  145/77 136/73  Pulse: 79  85 78  Temp: 100.1 F (37.8 C) 98.8 F (37.1 C) 99.6 F (37.6 C) 98.7 F (37.1 C)  TempSrc: Oral Oral Oral Oral  Resp: 18  20 20   Height:      Weight:      SpO2: 96%  97% 97%    General: Pt is more alert, follows commands appropriately, not in acute distress Cardiovascular: Regular rate and rhythm, S1/S2 +, no murmurs, no rubs, no gallops Respiratory: Clear to auscultation bilaterally, no wheezing, decreased breath sounds at bases  Abdominal: Soft, non tender, non  distended, bowel sounds +, no guarding Extremities: no edema, no cyanosis, pulses palpable bilaterally DP and PT  Discharge Instructions  Discharge Orders   Future Appointments Provider Department Dept Phone   07/05/2013 3:00 PM Huston Foley, MD [facility] [phone]   08/29/2013 8:30 AM Tiffany Alroy Dust, DO [facility] [phone]   Future Orders Complete By Expires     Diet - low sodium heart healthy  As directed     Diet - low sodium heart healthy  As directed     Increase activity slowly  As directed     Increase activity slowly  As directed         Medication List         acetaminophen 500 MG tablet  Commonly known as:  TYLENOL  Take 1,000 mg by mouth 2 (two) times daily.     aspirin 325 MG EC tablet  Take 1 tablet (325 mg total) by mouth daily.     atorvastatin 10 MG tablet  Commonly known as:  LIPITOR  Take 1 tablet (10 mg total) by mouth daily at 6 PM.     bacitracin ointment  Apply 1 application topically 2 (two) times daily. Applied to tip of penis for skin tear.  CALTRATE 600+D PLUS 600-800 MG-UNIT Chew  Chew 1 tablet by mouth 2 (two) times daily with a meal.     cetirizine 10 MG tablet  Commonly known as:  ZYRTEC  Take 10 mg by mouth daily as needed for allergies.     CRANBERRY PO  Take 4,200 mg by mouth 3 (three) times daily.     Cyanocobalamin 2500 MCG Tabs  Take 1 tablet by mouth 2 (two) times daily.     ferrous sulfate 325 (65 FE) MG tablet  Take 325 mg by mouth every other day.     guaiFENesin 600 MG 12 hr tablet  Commonly known as:  MUCINEX  Take 600 mg by mouth 2 (two) times daily as needed for congestion.     hydrALAZINE 25 MG tablet  Commonly known as:  APRESOLINE  Take 12.5 mg by mouth 2 (two) times daily.     ICAPS AREDS FORMULA PO  Take 2 tablets by mouth 2 (two) times daily.     lactose free nutrition Liqd  Take 90 mLs by mouth 2 (two) times daily between meals.     levothyroxine 25 MCG tablet   Commonly known as:  SYNTHROID, LEVOTHROID  Take 25 mcg by mouth daily.     MELATONIN EXTRA STRENGTH PO  Take 15 mLs by mouth at bedtime as needed (sleep).     memantine 10 MG tablet  Commonly known as:  NAMENDA  Take 10 mg by mouth 2 (two) times daily.     multivitamin with minerals Tabs  Take 1 tablet by mouth daily.     polyethylene glycol packet  Commonly known as:  MIRALAX / GLYCOLAX  Take 17 g by mouth daily.     potassium chloride SA 20 MEQ tablet  Commonly known as:  K-DUR,KLOR-CON  Take 1 tablet (20 mEq total) by mouth daily.           Follow-up Information   Follow up with REED, TIFFANY, DO In 2 weeks.   Contact information:   [address] [place] [address] [phone]       Follow up with Debbora Presto, MD. (As needed if symptoms worsen)    Contact information:   [address] [place] [address] [phone]     The results of significant diagnostics from this hospitalization (including imaging, microbiology, ancillary and laboratory) are listed below for reference.    Microbiology: Recent Results (from the past 240 hour(s))  URINE CULTURE     Status: None   Collection Time    06/08/13  1:55 PM      Result Value Range Status   Specimen Description URINE, CATHETERIZED   Final   Special Requests NONE   Final   Culture  Setup Time 06/09/2013 00:20   Final   Colony Count >=100,000 COLONIES/ML   Final   Culture ENTEROCOCCUS SPECIES   Final   Report Status 06/10/2013 FINAL   Final   Organism ID, Bacteria ENTEROCOCCUS SPECIES   Final  CULTURE, BLOOD (ROUTINE X 2)     Status: None   Collection Time    06/08/13  2:00 PM      Result Value Range Status   Specimen Description BLOOD LEFT HAND   Final   Special Requests BOTTLES DRAWN AEROBIC ONLY 3CC   Final   Culture  Setup Time 06/09/2013 00:51   Final   Culture     Final   Value:        BLOOD  CULTURE RECEIVED NO GROWTH TO DATE CULTURE WILL BE HELD FOR 5 DAYS BEFORE  ISSUING A FINAL NEGATIVE REPORT   Report Status PENDING   Incomplete  CULTURE, BLOOD (ROUTINE X 2)     Status: None   Collection Time    06/08/13  2:05 PM      Result Value Range Status   Specimen Description BLOOD LEFT ARM   Final   Special Requests BOTTLES DRAWN AEROBIC AND ANAEROBIC 5CC   Final   Culture  Setup Time 06/09/2013 00:51   Final   Culture     Final   Value:        BLOOD CULTURE RECEIVED NO GROWTH TO DATE CULTURE WILL BE HELD FOR 5 DAYS BEFORE ISSUING A FINAL NEGATIVE REPORT   Report Status PENDING   Incomplete  CSF CULTURE     Status: None   Collection Time    06/08/13  6:22 PM      Result Value Range Status   Specimen Description CSF   Final   Special Requests Normal   Final   Gram Stain     Final   Value: NO WBC SEEN     NO ORGANISMS SEEN     CYTOSPIN   Culture NO GROWTH 3 DAYS   Final   Report Status 06/12/2013 FINAL   Final  GRAM STAIN     Status: None   Collection Time    06/08/13  6:24 PM      Result Value Range Status   Specimen Description CSF   Final   Special Requests Normal   Final   Gram Stain     Final   Value: NO ORGANISMS SEEN     NO WBC SEEN     CYTOSPIN SAMPLE     Gram Stain Report Called to,Read Back By and Verified With: NEASE,A. AT 1948 ON 07.23.14 BY LOVE,T.   Report Status 06/08/2013 FINAL   Final    Labs: Basic Metabolic Panel:  Recent Labs Lab 06/10/13 0400 06/11/13 0420 06/12/13 0432 06/13/13 0414 06/14/13 0432  NA 137 135 136 136 138  K 3.3* 3.6 3.2* 3.2* 3.7  CL 103 102 101 103 104  CO2 24 24 25 23 24   GLUCOSE 89 87 88 99 98  BUN 8 9 9 13 11   CREATININE 0.70 0.66 0.63 0.72 0.68  CALCIUM 8.7 8.9 9.0 8.8 9.3   CBC:  Recent Labs Lab 06/08/13 1405 06/10/13 0400 06/11/13 0420 06/12/13 0432 06/14/13 0432  WBC 12.1* 6.0 4.7 5.2 5.3  NEUTROABS 10.6*  --   --   --   --   HGB 12.0* 10.6* 10.5* 10.7* 11.2*  HCT 37.0* 32.9* 31.8* 33.1* 33.6*  MCV 87.9 88.0 86.9 86.4 87.0  PLT 181 138* 146* 156 184   BNP (last 3  results)  Recent Labs  07/01/12 1632  PROBNP 118.0*   SIGNED: Time coordinating discharge: Over 30 minutes  Debbora Presto, MD  [facility] 06/14/2013, 10:29 AM Pager [phone]  If 7PM-7AM, please contact night-coverage [email or web address] Password [email or web address]

## 2013-06-15 ENCOUNTER — Inpatient Hospital Stay (HOSPITAL_COMMUNITY): Payer: Medicare HMO

## 2013-06-15 DIAGNOSIS — R5381 Other malaise: Secondary | ICD-10-CM

## 2013-06-15 DIAGNOSIS — R5383 Other fatigue: Secondary | ICD-10-CM

## 2013-06-15 LAB — CULTURE, BLOOD (ROUTINE X 2): Culture: NO GROWTH

## 2013-06-15 MED ORDER — VANCOMYCIN HCL 10 G IV SOLR
1500.0000 mg | Freq: Once | INTRAVENOUS | Status: AC
  Administered 2013-06-15: 1500 mg via INTRAVENOUS
  Filled 2013-06-15: qty 1500

## 2013-06-15 MED ORDER — LEVOTHYROXINE SODIUM 25 MCG PO TABS
25.0000 ug | ORAL_TABLET | Freq: Every day | ORAL | Status: DC
  Administered 2013-06-16 – 2013-06-17 (×2): 25 ug via ORAL
  Filled 2013-06-15 (×3): qty 1

## 2013-06-15 MED ORDER — VANCOMYCIN HCL IN DEXTROSE 1-5 GM/200ML-% IV SOLN
1000.0000 mg | Freq: Two times a day (BID) | INTRAVENOUS | Status: DC
  Administered 2013-06-16 (×2): 1000 mg via INTRAVENOUS
  Filled 2013-06-15 (×3): qty 200

## 2013-06-15 NOTE — Progress Notes (Signed)
Physical Therapy Treatment Patient Details Name: Ricky [name] MRN: [id number] DOB: 11-06-1917 Today's Date: 06/15/2013 Time: 6578-4696 PT Time Calculation (min): 27 min  PT Assessment / Plan / Recommendation  History of Present Illness     PT Comments   Pt A x O x 1.  Responds to functional commands and responds to some questions.  Assisted OOB + 2 total assist to amb + 2 HHA as pt was unable to functionally grasp RW.  Amb 13' x 2 with poor forward flex posture, flex hips/knees and on his toes. Rec nursing use HOYER to assist pt back to bed.   Follow Up Recommendations  Home health PT (pending family decision)     Does the patient have the potential to tolerate intense rehabilitation     Barriers to Discharge        Equipment Recommendations       Recommendations for Other Services    Frequency Min 2X/week   Progress towards PT Goals    Plan      Precautions / Restrictions Precautions Precautions: Fall Precaution Comments: Hx Alzheimers Required Braces or Orthoses: Other Brace/Splint Other Brace/Splint: pt had on B PRAFO for in bed positioning and heel protection/foot drop Restrictions Weight Bearing Restrictions: No    Pertinent Vitals/Pain No c/o pain    Mobility  Bed Mobility Bed Mobility: Supine to Sit;Sitting - Scoot to Edge of Bed;Sit to Supine Supine to Sit: 1: +2 Total assist;HOB elevated Supine to Sit: Patient Percentage: 20% Sitting - Scoot to Edge of Bed: 1: +2 Total assist Sitting - Scoot to Edge of Bed: Patient Percentage: 10% Details for Bed Mobility Assistance: increased time and repeat cueing to stay on task.  Pt very rigid and slow to respond.  Increased alertness and responce today.  Transfers Transfers: Sit to Stand;Stand to Sit Sit to Stand: 1: +2 Total assist;From bed;From toilet Stand to Sit: 1: +2 Total assist;To bed;To chair/3-in-1;To toilet Details for Transfer Assistance: Pt unable to assist with B UE's so total assist with sit to  stand.  Once upright pt able to support self but with poor posture of flexed hips/knees.  Pt requires total assist to control decend.  Ambulation/Gait Ambulation Distance (Feet): 26 Feet (13' x 2) Assistive device: None Ambulation/Gait Assistance Details: + 2 assist HHA total assist as pt demon poor flex hips/knees and "toe walks" with B LE's tight together.  #rd assist followed with recliner. Gait Pattern: Step-through pattern;Decreased step length - right;Decreased step length - left;Narrow base of support Gait velocity: very slow     PT Goals (current goals can now be found in the care plan section)    Visit Information  Last PT Received On: 06/15/13 Assistance Needed: +2    Subjective Data      Cognition       Balance   poor  End of Session PT - End of Session Equipment Utilized During Treatment: Gait belt Activity Tolerance: Patient limited by fatigue Patient left: in chair;with call bell/phone within reach Nurse Communication: Need for lift equipment   Felecia Shelling  PTA [facility]  Acute  Rehab Pager      [phone]

## 2013-06-15 NOTE — Progress Notes (Signed)
[facility] PROGRESS NOTE  Ricky [name] ZOX:[id number] DOB: [birth year] DOA: 06/08/2013 PCP: Bufford Spikes, DO  Brief narrative: Ricky [name] is an 79 y.o. male with a PMH of progressive supranuclear palsy, generalized failure to thrive with recurrent infections including UTIs and aspiration pneumonia, urinary retention with chronic indwelling Foley catheter and most recent hospitalization secondary to sepsis with Serratia and MRSA, who was admitted on 06/08/2013 with high fever. Patient's family is concerned about his mental status changes and reports that when he is not infected, his mental status is good. He has been treated with 5 days of vancomycin for enterococcal UTI. He also had an LP with CSF cultures negative. He was scheduled to be discharged today but his family has ongoing concerns about his current mental status and low-grade fevers. They have requested a second opinion with regard to his current medical care.  Assessment/Plan: Principal Problem:   Toxic encephalopathy, infectious versus progressive supranuclear palsy -Apathy can be a feature of progressive supranuclear palsy however he could have toxic encephalopathy from untreated infection. -Would re-culture the patient's urine and check a repeat chest x-ray and if these are unrevealing, would attribute his mental status changes to his underlying progressive neurological disorder. -Resume empiric vancomycin for now (complicated UTI). Active Problems:   Complicated enterococcal UTI -Patient appears to have had an adequate course of therapy with vancomycin. -Reculture urine.   HYPOTHYROIDISM -Continue home dose of Synthroid.   ALZHEIMERS DISEASE -Continue Namenda.   Gastroparesis -No vomiting noted.   PULMONARY EMBOLISM, HX OF -No evidence of recurrent PE.   Parkinsonian syndrome / neurological disorder -Supranuclear palsy per Dr. Imagene Gurney assessment.   CVA (cerebral infarction) -Continue aspirin, risk factor  modification.   FTT (failure to thrive) in adult -Suspect secondary to supranuclear palsy.   Dysphagia -ST evaluation done 06/13/13.  Continue dysphagia 1 diet.  Code Status: Full. Family Communication: Son and daughter in the room. Disposition Plan: Home when stable.   Medical Consultants:  None.  Other Consultants:  Physical therapy  Speech therapy    Anti-infectives:  Vancomycin 06/08/13--->  Cefipime 06/08/13--->06/12/13  Zosyn 06/08/13--->  Rocephin 06/08/13--->  HPI/Subjective: Ricky [name] is sitting up in the chair and does respond to questions. He denies dyspnea and cough. He denies pain. He is slow to respond which the family continues to insist is a change over his usual baseline level of functioning.  Objective: Filed Vitals:   06/14/13 1945 06/14/13 2120 06/15/13 0100 06/15/13 1344  BP:  131/74  138/75  Pulse:  96  70  Temp: 99.6 F (37.6 C) 99.9 F (37.7 C) 98.6 F (37 C) 98.8 F (37.1 C)  TempSrc: Oral Oral Oral   Resp:  20  16  Height:      Weight:      SpO2:  95%  95%    Intake/Output Summary (Last 24 hours) at 06/15/13 1627 Last data filed at 06/15/13 1500  Gross per 24 hour  Intake    600 ml  Output   1002 ml  Net   -402 ml    Exam: Gen:  Lethargic Cardiovascular:  RRR, No M/R/G Respiratory:  Lungs CTAB Gastrointestinal:  Abdomen soft, NT/ND, + BS Extremities:  No C/E/C  Data Reviewed: Basic Metabolic Panel:  Recent Labs Lab 06/10/13 0400 06/11/13 0420 06/12/13 0432 06/13/13 0414 06/14/13 0432  NA 137 135 136 136 138  K 3.3* 3.6 3.2* 3.2* 3.7  CL 103 102 101 103 104  CO2 24 24 25 23  24  GLUCOSE 89 87 88 99 98  BUN 8 9 9 13 11   CREATININE 0.70 0.66 0.63 0.72 0.68  CALCIUM 8.7 8.9 9.0 8.8 9.3   GFR Estimated Creatinine Clearance: 59.6 ml/min (by C-G formula based on Cr of 0.68).  CBC:  Recent Labs Lab 06/10/13 0400 06/11/13 0420 06/12/13 0432 06/14/13 0432  WBC 6.0 4.7 5.2 5.3  HGB 10.6* 10.5* 10.7* 11.2*   HCT 32.9* 31.8* 33.1* 33.6*  MCV 88.0 86.9 86.4 87.0  PLT 138* 146* 156 184   BNP (last 3 results)  Recent Labs  07/01/12 1632  PROBNP 118.0*   Microbiology Recent Results (from the past 240 hour(s))  URINE CULTURE     Status: None   Collection Time    06/08/13  1:55 PM      Result Value Range Status   Specimen Description URINE, CATHETERIZED   Final   Special Requests NONE   Final   Culture  Setup Time 06/09/2013 00:20   Final   Colony Count >=100,000 COLONIES/ML   Final   Culture ENTEROCOCCUS SPECIES   Final   Report Status 06/10/2013 FINAL   Final   Organism ID, Bacteria ENTEROCOCCUS SPECIES   Final  CULTURE, BLOOD (ROUTINE X 2)     Status: None   Collection Time    06/08/13  2:00 PM      Result Value Range Status   Specimen Description BLOOD LEFT HAND   Final   Special Requests BOTTLES DRAWN AEROBIC ONLY 3CC   Final   Culture  Setup Time 06/09/2013 00:51   Final   Culture NO GROWTH 5 DAYS   Final   Report Status 06/15/2013 FINAL   Final  CULTURE, BLOOD (ROUTINE X 2)     Status: None   Collection Time    06/08/13  2:05 PM      Result Value Range Status   Specimen Description BLOOD LEFT ARM   Final   Special Requests BOTTLES DRAWN AEROBIC AND ANAEROBIC 5CC   Final   Culture  Setup Time 06/09/2013 00:51   Final   Culture NO GROWTH 5 DAYS   Final   Report Status 06/15/2013 FINAL   Final  CSF CULTURE     Status: None   Collection Time    06/08/13  6:22 PM      Result Value Range Status   Specimen Description CSF   Final   Special Requests Normal   Final   Gram Stain     Final   Value: NO WBC SEEN     NO ORGANISMS SEEN     CYTOSPIN   Culture NO GROWTH 3 DAYS   Final   Report Status 06/12/2013 FINAL   Final  GRAM STAIN     Status: None   Collection Time    06/08/13  6:24 PM      Result Value Range Status   Specimen Description CSF   Final   Special Requests Normal   Final   Gram Stain     Final   Value: NO ORGANISMS SEEN     NO WBC SEEN     CYTOSPIN  SAMPLE     Gram Stain Report Called to,Read Back By and Verified With: NEASE,A. AT 1948 ON 07.23.14 BY LOVE,T.   Report Status 06/08/2013 FINAL   Final     Procedures and Diagnostic Studies: Ct Head Wo Contrast  06/08/2013   *RADIOLOGY REPORT*  Clinical Data: Fever.  Altered mental status.  CT HEAD WITHOUT  CONTRAST  Technique:  Contiguous axial images were obtained from the base of the skull through the vertex without contrast.  Comparison: CT head without contrast 05/15/2013.  Findings: Exaggerated cervical thoracic kyphosis is again noted. Reformatted axial images were provided.  Mild atrophy and white matter disease is similar to the prior exam.  No acute cortical infarct, hemorrhage, or mass lesion is present.  The ventricles are proportionate to the degree of atrophy.  No significant extra-axial fluid collection is present.  Bilateral sphenoid sinus opacification is again noted.  The paranasal sinuses and mastoid air cells are otherwise clear.  IMPRESSION:  1.  Stable atrophy and white matter disease. 2.  No acute intracranial abnormality. 3.  Bilateral sphenoid sinus disease.   Original Report Authenticated By: Marin Roberts, M.D.   Dg Chest Port 1 View  06/15/2013   *RADIOLOGY REPORT*  Clinical Data: Weakness, possible aspiration pneumonia  PORTABLE CHEST - 1 VIEW  Comparison: 06/08/2013  Findings: Stable cardiomegaly.  Atheromatous aorta.  No effusion. Stable patchy bibasilar atelectasis.  IMPRESSION:  Stable chronic changes of cardiomegaly.  No acute disease.   Original Report Authenticated By: D. Andria Rhein, MD   Dg Chest Port 1 View  06/08/2013   *RADIOLOGY REPORT*  Clinical Data: Fever.  PORTABLE CHEST - 1 VIEW  Comparison: Plain film of the chest 05/16/2013 and 02/11/2013.  Findings: Marked cardiomegaly.  Mild basilar atelectasis on the right is noted.  No consolidative process, pneumothorax or effusion.  The patient has a fracture of the left eighth rib which is not visualized on  the prior studies.  No pneumothorax.  IMPRESSION:  1.  No source for fever is identified. 2.  Acute left eighth rib fracture. 3.  Cardiomegaly.   Original Report Authenticated By: Holley Dexter, M.D.   Dg Fluoro Guide Lumbar Puncture  06/08/2013   *RADIOLOGY REPORT*  Clinical Data:Altered mental status.  LUMBAR PUNCTURE FLUORO GUIDE  Fluoroscopy Time: 43 seconds.  Comparison: Head CT performed same date.  Findings: Per request of  Dr.  Jodi Mourning, lumbar puncture was arranged under fluoroscopic guidance (no contraindications per phone discussion).  Head CT reviewed.  Witnessed phone consent was obtained from the patient's daughter and signed after the procedure and associated risks were discussed. Questions answered.  The patient was not able to lie in a supine position secondary to marked kyphosis.  Lumbar puncture had to be performed with the patient in a left side down decubitus position.  Under fluoroscopic guidance and aseptic technique, an L3-4 lumbar puncture was performed with a single pass of a 20-gauge spinal needle.  Initial blood tinged cerebral spinal fluid cleared.  Total of 10 ml of cerebrospinal fluid collected and sent for labs as per request.  Opening pressure not obtained.  No immediate complications.  IMPRESSION: Successful L3-4 lumbar puncture as noted above.   Original Report Authenticated By: Lacy Duverney, M.D.    Scheduled Meds: . feeding supplement  1 Container Oral BID BM  . heparin  5,000 Units Subcutaneous Q8H  . hydrALAZINE  25 mg Oral Q8H  . levothyroxine  12.5 mcg Intravenous QAC breakfast  . vancomycin  1,500 mg Intravenous Once  . [START ON 06/16/2013] vancomycin  1,000 mg Intravenous Q12H   Continuous Infusions:   Time spent: One hour with greater than 50% of the time spent discussing diagnostic test results, clinical impression, and plan of care.   LOS: 7 days   RAMA,CHRISTINA  [facility] Pager [phone].   *Please note that the hospitalists switch  teams on Wednesdays. Please call the flow manager at [phone] if you are having difficulty reaching the hospitalist taking care of this patient as she can update you and provide the most up-to-date pager number of provider caring for the patient. If 8PM-8AM, please contact night-coverage at [email or web address], password [facility]  06/15/2013, 4:27 PM

## 2013-06-15 NOTE — Progress Notes (Signed)
Patient was discharged yesterday by my partner. Arrangements were made for him to go home today, however, received a call from nursing staff that the patient's son had concerns about DC. Called son, Ricky [name] and daughter Ricky [name] via conference phone call. Patient's care was discussed and questions were answered over a 35 minute long conversation. Patient's family was still unpleased and asked to speak with my Wellsite geologist. Name provided to family, discussed with RN, nursing director, unit CM and SW.They refuse SNF placement. Patient remains, in my opinion, medically ready for DC. No changes to DC summary required.  Peggye Pitt, MD [facility] Pager: [phone]

## 2013-06-15 NOTE — Progress Notes (Signed)
ANTIBIOTIC CONSULT NOTE - INITIAL  Pharmacy Consult for vancomycin  Indication: enterococcal UTI  No Known Allergies  Patient Measurements: Height: 5\' 10"  (177.8 cm) Weight: 166 lb 7.2 oz (75.5 kg) IBW/kg (Calculated) : 73   Vital Signs: Temp: 98.8 F (37.1 C) (07/30 1344) BP: 138/75 mmHg (07/30 1344) Pulse Rate: 70 (07/30 1344) Intake/Output from previous day: 07/29 0701 - 07/30 0700 In: 740 [P.O.:540; IV Piggyback:200] Out: 876 [Urine:875; Stool:1] Intake/Output from this shift: Total I/O In: 240 [P.O.:240] Out: 301 [Urine:300; Stool:1]  Labs:  Recent Labs  06/13/13 0414 06/14/13 0432  WBC  --  5.3  HGB  --  11.2*  PLT  --  184  CREATININE 0.72 0.68   Estimated Creatinine Clearance: 59.6 ml/min (by C-G formula based on Cr of 0.68).  Microbiology: 7/23 2/2 Blood: NGF 7/23 Urine: >100K enterococcus species (S=nitrofurantoin and vanc only) 7/23 CSF: gram stain normal, glucose WNL, protein WNL, elevated RBCs, few WBCs. No organisms seen. Cx no growth 3 days (final)  7/30 Urine: ordered  Antibiotics this admission: 7/23 >> CTX x 1 7/23 >> Zosyn x 1 7/23 >> Cefepime >> 7/27 7/23 >> Vanc >> 7/29, retstarted 7/30   Assessment: 49 yoM admitted 7/23 with AMS and fever. Recent hospitalizaiton 6/29-7/3 for toxic metabolic encephalopathy and urosepsis secondary to Serratia and MRSA UTI. Hx chronic intermittent UTIs. Patient was started on vancomycin per pharmacy for enterococcal UTI with last dose of vancomycin at 2000 on 7/29. Patient to be discharged this AM, however family not accepting of d/c and do not want patient to go to SNF. Appears patient will remain in-patient. Vancomycin per pharmacy to be restarted for continuation of therapy for enterococcal UTI.  Vancomycin trough 8.1 on 7/25 with 750mg  IV Q12h dosing  Vancomycin dose increased at that time to 1000mg  IV q12h  Renal function has remained stable this admission  SCr 0.68 with AM labs on 7/29, UOP  0.5/kg/hr  Patient is afebrile, WBC wnl   Goal of Therapy:  Vancomycin trough level 10-15 mcg/ml  Plan:  - vancomycin 1500mg  IV  x1 to get patient back to therapeutic level - vancomycin 1000mg  IV q12h starting 7/31 at 0500 based on stable renal function and previous trough level - follow-up renal function, antibiotic LOT, clinical course, discharge plans - vancomycin trough when back at [facility] if indicated  Thank you for the consult.  Tomi Bamberger, PharmD Clinical Pharmacist Pager: [phone] 06/15/2013 4:14 PM

## 2013-06-15 NOTE — Progress Notes (Addendum)
Report given to Reita Cliche, RN to assume care of this patient. [facility] notified. Will update family at 7am.  Earnest Conroy. Clelia Croft, RN

## 2013-06-15 NOTE — Progress Notes (Signed)
Spoke with family this morning they had questions concerning pt's d/c.  MD notified and will call pt's family.  Neurosurgeon and Asst. Director made aware.  Case manager involved and will call pt's family and advise of hospital policy and procedures on pt d/c.

## 2013-06-16 LAB — URINE CULTURE
Colony Count: NO GROWTH
Culture: NO GROWTH

## 2013-06-16 NOTE — Progress Notes (Signed)
[facility] PROGRESS NOTE  ERMON SAGAN WRU:[id number] DOB: 20-Mar-1917 DOA: 06/08/2013 PCP: Bufford Spikes, DO  Brief narrative: Ricky [name] is an 79 y.o. male with a PMH of progressive supranuclear palsy, generalized failure to thrive with recurrent infections including UTIs and aspiration pneumonia, urinary retention with chronic indwelling Foley catheter and most recent hospitalization secondary to sepsis with Serratia and MRSA, who was admitted on 06/08/2013 with high fever. Patient's family is concerned about his mental status changes and reports that when he is not infected, his mental status is good. He has been treated with 5 days of vancomycin for enterococcal UTI. He also had an LP with CSF cultures negative. He was scheduled to be discharged today but his family has ongoing concerns about his current mental status and low-grade fevers. They have requested a second opinion with regard to his current medical care.  Assessment/Plan: Principal Problem:   Toxic encephalopathy, infectious versus progressive supranuclear palsy -Apathy can be a feature of progressive supranuclear palsy however he could have toxic encephalopathy from untreated infection. -Chest x-ray repeated 06/15/2013 with no evidence of pneumonia. Urine culture repeated, at this time, I am inclined to attribute his mental status changes to his underlying progressive neurological disorder. -Continue empiric vancomycin for now (complicated UTI) until urine culture finalized. Active Problems:   Complicated enterococcal UTI -Patient appears to have had an adequate course of therapy with vancomycin. -Followup reculture of urine which was sent 06/15/2013.   HYPOTHYROIDISM -Continue home dose of Synthroid.   ALZHEIMERS DISEASE -Continue Namenda.   Gastroparesis -No vomiting noted.   PULMONARY EMBOLISM, HX OF -No evidence of recurrent PE.   Parkinsonian syndrome / neurological disorder -Supranuclear palsy per Dr.  Imagene Gurney assessment.   CVA (cerebral infarction) -Continue aspirin, risk factor modification.   FTT (failure to thrive) in adult -Suspect secondary to supranuclear palsy.   Dysphagia -ST evaluation done 06/13/13.  Continue dysphagia 1 diet.  Code Status: Full. Family Communication: Attempted to reach [facility], waiting for call back. Disposition Plan: Home when stable.   Medical Consultants:  None.  Other Consultants:  Physical therapy  Speech therapy    Anti-infectives:  Vancomycin 06/08/13--->  Cefipime 06/08/13--->06/12/13  Zosyn 06/08/13--->  Rocephin 06/08/13--->  HPI/Subjective: Ricky [name] is sitting up in the chair and has eaten and walked in the halls with staff.  No complaints.  Objective: Filed Vitals:   06/15/13 1344 06/15/13 2100 06/15/13 2151 06/16/13 0634  BP: 138/75  142/84 169/91  Pulse: 70  74 66  Temp: 98.8 F (37.1 C) 98.1 F (36.7 C) 99.3 F (37.4 C) 98 F (36.7 C)  TempSrc:  Oral Oral Oral  Resp: 16  14 14   Height:      Weight:      SpO2: 95%  95% 95%    Intake/Output Summary (Last 24 hours) at 06/16/13 0803 Last data filed at 06/16/13 9147  Gross per 24 hour  Intake    800 ml  Output   1276 ml  Net   -476 ml    Exam: Gen:  Lethargic Cardiovascular:  RRR, No M/R/G Respiratory:  Lungs CTAB Gastrointestinal:  Abdomen soft, NT/ND, + BS Extremities:  No C/E/C  Data Reviewed: Basic Metabolic Panel:  Recent Labs Lab 06/10/13 0400 06/11/13 0420 06/12/13 0432 06/13/13 0414 06/14/13 0432  NA 137 135 136 136 138  K 3.3* 3.6 3.2* 3.2* 3.7  CL 103 102 101 103 104  CO2 24 24 25 23 24   GLUCOSE 89 87 88 99 98  BUN 8 9 9 13 11   CREATININE 0.70 0.66 0.63 0.72 0.68  CALCIUM 8.7 8.9 9.0 8.8 9.3   GFR Estimated Creatinine Clearance: 59.6 ml/min (by C-G formula based on Cr of 0.68).  CBC:  Recent Labs Lab 06/10/13 0400 06/11/13 0420 06/12/13 0432 06/14/13 0432  WBC 6.0 4.7 5.2 5.3  HGB 10.6* 10.5* 10.7* 11.2*  HCT 32.9*  31.8* 33.1* 33.6*  MCV 88.0 86.9 86.4 87.0  PLT 138* 146* 156 184   BNP (last 3 results)  Recent Labs  07/01/12 1632  PROBNP 118.0*   Microbiology Recent Results (from the past 240 hour(s))  URINE CULTURE     Status: None   Collection Time    06/08/13  1:55 PM      Result Value Range Status   Specimen Description URINE, CATHETERIZED   Final   Special Requests NONE   Final   Culture  Setup Time 06/09/2013 00:20   Final   Colony Count >=100,000 COLONIES/ML   Final   Culture ENTEROCOCCUS SPECIES   Final   Report Status 06/10/2013 FINAL   Final   Organism ID, Bacteria ENTEROCOCCUS SPECIES   Final  CULTURE, BLOOD (ROUTINE X 2)     Status: None   Collection Time    06/08/13  2:00 PM      Result Value Range Status   Specimen Description BLOOD LEFT HAND   Final   Special Requests BOTTLES DRAWN AEROBIC ONLY 3CC   Final   Culture  Setup Time 06/09/2013 00:51   Final   Culture NO GROWTH 5 DAYS   Final   Report Status 06/15/2013 FINAL   Final  CULTURE, BLOOD (ROUTINE X 2)     Status: None   Collection Time    06/08/13  2:05 PM      Result Value Range Status   Specimen Description BLOOD LEFT ARM   Final   Special Requests BOTTLES DRAWN AEROBIC AND ANAEROBIC 5CC   Final   Culture  Setup Time 06/09/2013 00:51   Final   Culture NO GROWTH 5 DAYS   Final   Report Status 06/15/2013 FINAL   Final  CSF CULTURE     Status: None   Collection Time    06/08/13  6:22 PM      Result Value Range Status   Specimen Description CSF   Final   Special Requests Normal   Final   Gram Stain     Final   Value: NO WBC SEEN     NO ORGANISMS SEEN     CYTOSPIN   Culture NO GROWTH 3 DAYS   Final   Report Status 06/12/2013 FINAL   Final  GRAM STAIN     Status: None   Collection Time    06/08/13  6:24 PM      Result Value Range Status   Specimen Description CSF   Final   Special Requests Normal   Final   Gram Stain     Final   Value: NO ORGANISMS SEEN     NO WBC SEEN     CYTOSPIN SAMPLE     Gram  Stain Report Called to,Read Back By and Verified With: NEASE,A. AT 1948 ON 07.23.14 BY LOVE,T.   Report Status 06/08/2013 FINAL   Final     Procedures and Diagnostic Studies: Ct Head Wo Contrast  06/08/2013   *RADIOLOGY REPORT*  Clinical Data: Fever.  Altered mental status.  CT HEAD WITHOUT CONTRAST  Technique:  Contiguous axial images  were obtained from the base of the skull through the vertex without contrast.  Comparison: CT head without contrast 05/15/2013.  Findings: Exaggerated cervical thoracic kyphosis is again noted. Reformatted axial images were provided.  Mild atrophy and white matter disease is similar to the prior exam.  No acute cortical infarct, hemorrhage, or mass lesion is present.  The ventricles are proportionate to the degree of atrophy.  No significant extra-axial fluid collection is present.  Bilateral sphenoid sinus opacification is again noted.  The paranasal sinuses and mastoid air cells are otherwise clear.  IMPRESSION:  1.  Stable atrophy and white matter disease. 2.  No acute intracranial abnormality. 3.  Bilateral sphenoid sinus disease.   Original Report Authenticated By: Marin Roberts, M.D.   Dg Chest Port 1 View  06/15/2013   *RADIOLOGY REPORT*  Clinical Data: Weakness, possible aspiration pneumonia  PORTABLE CHEST - 1 VIEW  Comparison: 06/08/2013  Findings: Stable cardiomegaly.  Atheromatous aorta.  No effusion. Stable patchy bibasilar atelectasis.  IMPRESSION:  Stable chronic changes of cardiomegaly.  No acute disease.   Original Report Authenticated By: D. Andria Rhein, MD   Dg Chest Port 1 View  06/08/2013   *RADIOLOGY REPORT*  Clinical Data: Fever.  PORTABLE CHEST - 1 VIEW  Comparison: Plain film of the chest 05/16/2013 and 02/11/2013.  Findings: Marked cardiomegaly.  Mild basilar atelectasis on the right is noted.  No consolidative process, pneumothorax or effusion.  The patient has a fracture of the left eighth rib which is not visualized on the prior studies.   No pneumothorax.  IMPRESSION:  1.  No source for fever is identified. 2.  Acute left eighth rib fracture. 3.  Cardiomegaly.   Original Report Authenticated By: Holley Dexter, M.D.   Dg Fluoro Guide Lumbar Puncture  06/08/2013   *RADIOLOGY REPORT*  Clinical Data:Altered mental status.  LUMBAR PUNCTURE FLUORO GUIDE  Fluoroscopy Time: 43 seconds.  Comparison: Head CT performed same date.  Findings: Per request of  Dr.  Jodi Mourning, lumbar puncture was arranged under fluoroscopic guidance (no contraindications per phone discussion).  Head CT reviewed.  Witnessed phone consent was obtained from the patient's daughter and signed after the procedure and associated risks were discussed. Questions answered.  The patient was not able to lie in a supine position secondary to marked kyphosis.  Lumbar puncture had to be performed with the patient in a left side down decubitus position.  Under fluoroscopic guidance and aseptic technique, an L3-4 lumbar puncture was performed with a single pass of a 20-gauge spinal needle.  Initial blood tinged cerebral spinal fluid cleared.  Total of 10 ml of cerebrospinal fluid collected and sent for labs as per request.  Opening pressure not obtained.  No immediate complications.  IMPRESSION: Successful L3-4 lumbar puncture as noted above.   Original Report Authenticated By: Lacy Duverney, M.D.    Scheduled Meds: . feeding supplement  1 Container Oral BID BM  . heparin  5,000 Units Subcutaneous Q8H  . hydrALAZINE  25 mg Oral Q8H  . levothyroxine  25 mcg Oral QAC breakfast  . vancomycin  1,000 mg Intravenous Q12H   Continuous Infusions:   Time spent: 25 minutes.   LOS: 8 days   RAMA,CHRISTINA  [facility] Pager [phone].   *Please note that the hospitalists switch teams on Wednesdays. Please call the flow manager at [phone] if you are having difficulty reaching the hospitalist taking care of this patient as she can update you and provide the most up-to-date pager  number  of provider caring for the patient. If 8PM-8AM, please contact night-coverage at [email or web address], password [facility]  06/16/2013, 8:03 AM

## 2013-06-16 NOTE — Progress Notes (Signed)
Physical Therapy Treatment Patient Details Name: Ricky [name] MRN: [id number] DOB: Apr 01, 1917 Today's Date: 06/16/2013 Time: 4098-1191 PT Time Calculation (min): 17 min  PT Assessment / Plan / Recommendation  History of Present Illness     PT Comments   Pt more alert and conversive this afternoon.  Very polite. For the most part flat affect and few words.  Amb a second time then assist back to bed.  Pt still requires + 2 total assist for transfers and gait.  Very rigid throughout with "fetal position" posture. Demonstrates Advanced Alzheimers   Follow Up Recommendations  SNF or 24/7 family care at home (pending family decision)      Does the patient have the potential to tolerate intense rehabilitation     Barriers to Discharge        Equipment Recommendations       Recommendations for Other Services    Frequency     Progress towards PT Goals Progress towards PT goals: Progressing toward goals  Plan      Precautions / Restrictions Precautions Precautions: Fall Precaution Comments: Hx Alzheimers Required Braces or Orthoses: Other Brace/Splint Other Brace/Splint: pt had on B PRAFO for in bed positioning and heel protection/foot drop Restrictions Weight Bearing Restrictions: No    Pertinent Vitals/Pain No c/o pain    Mobility  Bed Mobility Bed Mobility: Sit to Supine Sit to Supine: 1: +2 Total assist;HOB flat Details for Bed Mobility Assistance: Assisted back to bed and applied B AFO's Transfers Transfers: Sit to Stand;Stand to Sit Sit to Stand: 1: +2 Total assist;From chair/3-in-1 Stand to Sit: 1: +2 Total assist;To bed Details for Transfer Assistance: Once upright pt is able to stand but offers no self assist to get there.  Poor standing balance and poor standing posture of flexed hips, knees and forward flex cervical posture.  Pt also rigid/stiff throughout.  Ambulation/Gait Ambulation/Gait Assistance: 1: +2 Total assist Ambulation Distance (Feet): 18  Feet Assistive device: Rolling walker Ambulation/Gait Assistance Details: + 2 assist with MAX VC's to increase step length and incourage upright posture. Pt requires total assist + 2 to advance RW and prevent forward LOB. Pt very rigid throughout amb with flex hips/knees and walking on his toes.  Gait Pattern: Step-through pattern;Decreased step length - right;Decreased step length - left;Narrow base of support Gait velocity: very slow General Gait Details: Very rigid throughout.  HIGH FALL RISK    PT Goals (current goals can now be found in the care plan section)    Visit Information  Last PT Received On: 06/16/13 Assistance Needed: +2    Subjective Data      Cognition       Balance     End of Session PT - End of Session Equipment Utilized During Treatment: Gait belt Activity Tolerance: Patient tolerated treatment well Patient left: in bed;with call bell/phone within reach Nurse Communication: Need for lift equipment   Felecia Shelling  PTA [facility]  Acute  Rehab Pager      [phone]

## 2013-06-16 NOTE — Progress Notes (Signed)
Physical Therapy Treatment Patient Details Name: Ricky [name] MRN: [id number] DOB: October 07, 1917 Today's Date: 06/16/2013 Time: 1203-1230 PT Time Calculation (min): 27 min  PT Assessment / Plan / Recommendation  History of Present Illness     PT Comments   Pt appears even more groggy and less verbally responsive this session.  When asked "How do you feel?'  Pt reports "I feel good". Pt requires total assist with all activities of daily living.    Follow Up Recommendations  SNF or 24/7 family assist      Does the patient have the potential to tolerate intense rehabilitation     Barriers to Discharge        Equipment Recommendations       Recommendations for Other Services    Frequency Min 2X/week   Progress towards PT Goals Progress towards PT goals: Not progressing toward goals - comment (Unspecified AMS)  Plan Current plan remains appropriate    Precautions / Restrictions Precautions Precautions: Fall Precaution Comments: Hx Alzheimers Required Braces or Orthoses: Other Brace/Splint Other Brace/Splint: pt had on B PRAFO for in bed positioning and heel protection/foot drop Restrictions Weight Bearing Restrictions: No    Pertinent Vitals/Pain No c/o pain    Mobility  Bed Mobility Bed Mobility: Supine to Sit;Sitting - Scoot to Edge of Bed;Sit to Supine Supine to Sit: 1: +2 Total assist;HOB elevated Supine to Sit: Patient Percentage: 0% Details for Bed Mobility Assistance: increased assist this session from pt 20% yesterday to pt 0% today. Appears more groggy and even more slow to respond.  Transfers Transfers: Sit to Stand;Stand to Sit Sit to Stand: 1: +2 Total assist;From bed Sit to Stand: Patient Percentage: 0% Stand to Sit: 1: +2 Total assist Stand to Sit: Patient Percentage: 10% Details for Transfer Assistance: Once upright pt is able to stand but offers no self assist to get there.  Poor standing balance and poor standing posture of flexed hips, knees and  forward flex cervical posture.  Pt also rigid/stiff throughout.  Ambulation/Gait Ambulation/Gait Assistance: 1: +2 Total assist (3rd assist needed to follow with chair.) Ambulation/Gait: Patient Percentage: 30% Ambulation Distance (Feet): 12 Feet Assistive device: Rolling walker Ambulation/Gait Assistance Details: + 2 total assist with hand over hand assist for pt to grasp walker and total assist to advance RW as pt slowly steps with very short steps walking on his toes. Poor posture with severe anterior forward lean.   Gait Pattern: Step-through pattern;Decreased step length - right;Decreased step length - left;Narrow base of support Gait velocity: very slow General Gait Details: Very rigid throughout.  HIGH FALL RISK     PT Goals (current goals can now be found in the care plan section)    Visit Information  Last PT Received On: 06/16/13 Assistance Needed: +2    Subjective Data      Cognition  Cognition Arousal/Alertness: Awake/alert Behavior During Therapy: Flat affect    Balance     End of Session PT - End of Session Equipment Utilized During Treatment: Gait belt Activity Tolerance: Patient limited by lethargy Patient left: in chair;with call bell/phone within reach Nurse Communication: Need for lift equipment   Felecia Shelling  PTA [facility]  Acute  Rehab Pager      [phone]

## 2013-06-16 NOTE — Progress Notes (Signed)
06/16/2013 Colleen Can BSN RN CCM [phone] CM received call froM [employer] regarding appeals results: states agree that patient is ready for discharge; private pay will start on August 1st, 2014 at 12 noon. States she has called family member and left message on voice mail of appeals results. States she will send determination letter to patient, insurance company and [facility]. CM notified attending MD of determination.

## 2013-06-17 MED ORDER — VANCOMYCIN HCL IN DEXTROSE 750-5 MG/150ML-% IV SOLN
750.0000 mg | Freq: Two times a day (BID) | INTRAVENOUS | Status: DC
  Administered 2013-06-17: 750 mg via INTRAVENOUS
  Filled 2013-06-17: qty 150

## 2013-06-17 NOTE — Progress Notes (Signed)
ANTIBIOTIC CONSULT NOTE - F/U  Pharmacy Consult for vancomycin  Indication: enterococcal UTI  No Known Allergies  Patient Measurements: Height: 5\' 10"  (177.8 cm) Weight: 166 lb 7.2 oz (75.5 kg) IBW/kg (Calculated) : 73   Vital Signs: Temp: 99.1 F (37.3 C) (08/01 0538) Temp src: Oral (08/01 0538) BP: 148/79 mmHg (08/01 0538) Pulse Rate: 62 (08/01 0538) Intake/Output from previous day: 07/31 0701 - 08/01 0700 In: 680 [P.O.:600; I.V.:80] Out: 1375 [Urine:1375] Intake/Output from this shift: Total I/O In: 80 [I.V.:80] Out: 750 [Urine:750]  Labs: No results found for this basename: WBC, HGB, PLT, LABCREA, CREATININE,  in the last 72 hours Estimated Creatinine Clearance: 59.6 ml/min (by C-G formula based on Cr of 0.68).  Microbiology: 7/23 2/2 Blood: NGF 7/23 Urine: >100K enterococcus species (S=nitrofurantoin and vanc only) 7/23 CSF: gram stain normal, glucose WNL, protein WNL, elevated RBCs, few WBCs. No organisms seen. Cx no growth 3 days (final)  7/30 Urine: ordered  Antibiotics this admission: 7/23 >> CTX x 1 7/23 >> Zosyn x 1 7/23 >> Cefepime >> 7/27 7/23 >> Vanc >> 7/29, retstarted 7/30   Assessment: 78 yoM admitted 7/23 with AMS and fever. Recent hospitalizaiton 6/29-7/3 for toxic metabolic encephalopathy and urosepsis secondary to Serratia and MRSA UTI. Hx chronic intermittent UTIs. Patient was started on vancomycin per pharmacy for enterococcal UTI with last dose of vancomycin at 2000 on 7/29. Patient to be discharged this AM, however family not accepting of d/c and do not want patient to go to SNF. Appears patient will remain in-patient. Vancomycin per pharmacy to be restarted for continuation of therapy for enterococcal UTI.  Vancomycin trough 8.1 on 7/25 with 750mg  IV Q12h dosing  Vancomycin dose increased at that time to 1000mg  IV q12h  Renal function has remained stable this admission  SCr 0.68 with AM labs on 7/29  Patient is afebrile, WBC  wnl   Goal of Therapy:  Vancomycin trough level 10-15 mcg/ml  Plan:  - Decrease Vancomycin to 750mg  IV q12h (likely accumulating due to age) - follow-up renal function, antibiotic LOT, clinical course, discharge plans - vancomycin trough when back at [facility] if indicated     Lorenza Evangelist 06/17/2013 5:45 AM

## 2013-06-17 NOTE — Discharge Summary (Signed)
Physician Discharge Summary  Ricky [name] ZOX:[id number] DOB: 12/10/1916 DOA: 06/08/2013  PCP: Bufford Spikes, DO  Admit date: 06/08/2013 Discharge date: 06/17/2013  Recommendations for Outpatient Follow-up:  1. F/U with PCP / neurologist as needed.  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  Diet recommendation: Low-sodium, heart healthy.  History of present illness:  Ricky [name] is an 79 y.o. male with a PMH of progressive supranuclear palsy, generalized failure to thrive with recurrent infections including UTIs and aspiration pneumonia, urinary retention with chronic indwelling Foley catheter and most recent hospitalization secondary to sepsis with Serratia and MRSA, who was admitted on 06/08/2013 with high fever. Patient's family is concerned about his mental status changes and reports that when he is not infected, his mental status is good. He completed a course of 5 days of vancomycin for enterococcal UTI on 06/15/2013. He also had an LP with CSF cultures negative. He was scheduled to be discharged 06/15/2013 but his family had ongoing concerns about his persistent mental status change and ongoing low-grade fevers. They requested a second opinion with regard to his current medical care.  Hospital Course by problem:  Principal Problem:  Toxic encephalopathy, infectious versus progressive supranuclear palsy  -Apathy is a prominent feature of progressive supranuclear palsy however he could have toxic encephalopathy from untreated infection. He had serial chest radiographs which were negative for pneumonia and his enterococcal UTI was fully  treated with an appropriate course of IV vancomycin with followup cultures negative. -At discharge, his mental status has improved. Active Problems:  Complicated enterococcal UTI  -Patient appears to have had an adequate course of therapy with vancomycin.  -Followup urine cultures obtained 06/15/2013 and were negative. HYPOTHYROIDISM  -Continue home dose of Synthroid.  ALZHEIMERS DISEASE  -Continue Namenda.  Gastroparesis  -No vomiting noted.  PULMONARY EMBOLISM, HX OF  -No evidence of recurrent PE.  Parkinsonian syndrome / neurological disorder  -Supranuclear palsy per Dr. Imagene Gurney assessment.  -Recommend followup with neurologist in 2-3 weeks. CVA (cerebral infarction)  -Continue aspirin, risk factor modification.  FTT (failure to thrive) in adult  -Suspect secondary to supranuclear palsy.  Dysphagia  -ST evaluation done 06/13/13. Continue dysphagia 1 diet.  Procedures:  None.  Consultations:  None.  Discharge Exam: Filed Vitals:   06/17/13 0538  BP: 148/79  Pulse: 62  Temp: 99.1 F (37.3 C)  Resp: 16   Filed Vitals:   06/16/13 0634 06/16/13 1300 06/16/13 2124 06/17/13 0538  BP: 169/91 162/71 162/82 148/79  Pulse: 66 79 67 62  Temp: 98 F (36.7 C) 98.6 F (37 C) 98.5 F (36.9 C) 99.1 F (37.3 C)  TempSrc: Oral  Oral Oral  Resp: 14 16 16 16   Height:      Weight:      SpO2: 95% 96% 97% 94%    Gen:  NAD Cardiovascular:  RRR, No M/R/G Respiratory: Lungs CTAB Gastrointestinal: Abdomen soft, NT/ND with normal active bowel sounds. Extremities: No C/E/C   Discharge Instructions      Discharge Orders   Future Appointments Provider Department Dept Phone   07/05/2013 3:00 PM Huston Foley, MD [facility] [phone]  08/29/2013 8:30 AM Kermit Balo, DO [facility] [phone]   Future Orders Complete By Expires     Call MD for:  As directed     Scheduling Instructions:      Fever, persistent changes in mental status.    Diet  - low sodium heart healthy  As directed     Discharge instructions  As directed     Comments:      You were cared for by Dr. Hillery Aldo  (a hospitalist) during your hospital stay. If you have any questions about your discharge medications or the care you received while you were in the hospital after you are discharged, you can call the unit and ask to speak with the hospitalist on call if the hospitalist that took care of you is not available. Once you are discharged, your primary care physician will handle any further medical issues. Please note that NO REFILLS for any discharge medications will be authorized once you are discharged, as it is imperative that you return to your primary care physician (or establish a relationship with a primary care physician if you do not have one) for your aftercare needs so that they can reassess your need for medications and monitor your lab values.  Any outstanding tests can be reviewed by your PCP at your follow up visit.  It is also important to review any medicine changes with your PCP.  Please bring these d/c instructions with you to your next visit so your physician can review these changes with you.  If you do not have a primary care physician, you can call [phone] for a physician referral.  It is highly recommended that you obtain a PCP for hospital follow up.    Face-to-face encounter (required for Medicare/Medicaid patients)  As directed     Comments:      I Ricky [name] certify that this patient is under my care and that I, or a nurse practitioner or physician's assistant working with me, had a face-to-face encounter that meets the physician face-to-face encounter requirements with this patient on 06/17/2013. The encounter with the patient was in whole, or in part for the following medical condition(s) which is the primary reason for home health care (List medical condition): Progressive supranuclear palsy, urinary retention/catheter dependent with  recurrent UTI.    Questions:      The encounter with the patient was in whole, or in part, for the following medical condition, which is the primary reason for home health care:  Progressive supranuclear palsy, urinary retention/catheter dependent with recurrent UTI    I certify that, based on my findings, the following services are medically necessary home health services:  Nursing    Physical therapy    My clinical findings support the need for the above services:  Unsafe ambulation due to balance issues    Further, I certify that my clinical findings support that this patient is homebound due to:  Unable to leave home safely without assistance    Reason for Medically Necessary Home Health Services:  Skilled Nursing- Skilled Assessment/Observation    Therapy- Home Adaptation to Facilitate Safety    Home Health  As directed     Questions:      To provide the following care/treatments:  RN    PT    Increase activity slowly  As directed     Walk with assistance  As directed         Medication List  acetaminophen 500 MG tablet  Commonly known as:  TYLENOL  Take 1,000 mg by mouth 2 (two) times daily.     aspirin 325 MG EC tablet  Take 1 tablet (325 mg total) by mouth daily.     atorvastatin 10 MG tablet  Commonly known as:  LIPITOR  Take 1 tablet (10 mg total) by mouth daily at 6 PM.     bacitracin ointment  Apply 1 application topically 2 (two) times daily. Applied to tip of penis for skin tear.     CALTRATE 600+D PLUS 600-800 MG-UNIT Chew  Chew 1 tablet by mouth 2 (two) times daily with a meal.     cetirizine 10 MG tablet  Commonly known as:  ZYRTEC  Take 10 mg by mouth daily as needed for allergies.     CRANBERRY PO  Take 4,200 mg by mouth 3 (three) times daily.     Cyanocobalamin 2500 MCG Tabs  Take 1 tablet by mouth 2 (two) times daily.     ferrous sulfate 325 (65 FE) MG tablet  Take 325 mg by mouth every other day.     guaiFENesin 600 MG 12 hr tablet   Commonly known as:  MUCINEX  Take 600 mg by mouth 2 (two) times daily as needed for congestion.     hydrALAZINE 25 MG tablet  Commonly known as:  APRESOLINE  Take 12.5 mg by mouth 2 (two) times daily.     ICAPS AREDS FORMULA PO  Take 2 tablets by mouth 2 (two) times daily.     lactose free nutrition Liqd  Take 90 mLs by mouth 2 (two) times daily between meals.     levothyroxine 25 MCG tablet  Commonly known as:  SYNTHROID, LEVOTHROID  Take 25 mcg by mouth daily.     MELATONIN EXTRA STRENGTH PO  Take 15 mLs by mouth at bedtime as needed (sleep).     memantine 10 MG tablet  Commonly known as:  NAMENDA  Take 10 mg by mouth 2 (two) times daily.     multivitamin with minerals Tabs  Take 1 tablet by mouth daily.     polyethylene glycol packet  Commonly known as:  MIRALAX / GLYCOLAX  Take 17 g by mouth daily.     potassium chloride SA 20 MEQ tablet  Commonly known as:  K-DUR,KLOR-CON  Take 1 tablet (20 mEq total) by mouth daily.       Follow-up Information   Follow up with REED, TIFFANY, DO In 2 weeks.   Contact information:   [address] [place] [address] [phone]       Follow up with [facility]. Schedule an appointment as soon as possible for a visit in 2 weeks.   Contact information:   [address] [place] [address] [phone]       The results of significant diagnostics from this hospitalization (including imaging, microbiology, ancillary and laboratory) are listed below for reference.    Significant Diagnostic Studies: Ct Head Wo Contrast  06/08/2013   *RADIOLOGY REPORT*  Clinical Data: Fever.  Altered mental status.  CT HEAD WITHOUT CONTRAST  Technique:  Contiguous axial images were obtained from the base of the skull through the vertex without contrast.  Comparison: CT head without contrast 05/15/2013.  Findings: Exaggerated cervical thoracic kyphosis is again noted. Reformatted axial images were provided.   Mild atrophy and white matter disease is similar to the prior exam.  No acute cortical infarct, hemorrhage, or  mass lesion is present.  The ventricles are proportionate to the degree of atrophy.  No significant extra-axial fluid collection is present.  Bilateral sphenoid sinus opacification is again noted.  The paranasal sinuses and mastoid air cells are otherwise clear.  IMPRESSION:  1.  Stable atrophy and white matter disease. 2.  No acute intracranial abnormality. 3.  Bilateral sphenoid sinus disease.   Original Report Authenticated By: Marin Roberts, M.D.   Dg Chest Port 1 View  06/15/2013   *RADIOLOGY REPORT*  Clinical Data: Weakness, possible aspiration pneumonia  PORTABLE CHEST - 1 VIEW  Comparison: 06/08/2013  Findings: Stable cardiomegaly.  Atheromatous aorta.  No effusion. Stable patchy bibasilar atelectasis.  IMPRESSION:  Stable chronic changes of cardiomegaly.  No acute disease.   Original Report Authenticated By: D. Andria Rhein, MD   Dg Chest Port 1 View  06/08/2013   *RADIOLOGY REPORT*  Clinical Data: Fever.  PORTABLE CHEST - 1 VIEW  Comparison: Plain film of the chest 05/16/2013 and 02/11/2013.  Findings: Marked cardiomegaly.  Mild basilar atelectasis on the right is noted.  No consolidative process, pneumothorax or effusion.  The patient has a fracture of the left eighth rib which is not visualized on the prior studies.  No pneumothorax.  IMPRESSION:  1.  No source for fever is identified. 2.  Acute left eighth rib fracture. 3.  Cardiomegaly.   Original Report Authenticated By: Holley Dexter, M.D.   Dg Fluoro Guide Lumbar Puncture  06/08/2013   *RADIOLOGY REPORT*  Clinical Data:Altered mental status.  LUMBAR PUNCTURE FLUORO GUIDE  Fluoroscopy Time: 43 seconds.  Comparison: Head CT performed same date.  Findings: Per request of  Dr.  Jodi Mourning, lumbar puncture was arranged under fluoroscopic guidance (no contraindications per phone discussion).  Head CT reviewed.  Witnessed phone  consent was obtained from the patient's daughter and signed after the procedure and associated risks were discussed. Questions answered.  The patient was not able to lie in a supine position secondary to marked kyphosis.  Lumbar puncture had to be performed with the patient in a left side down decubitus position.  Under fluoroscopic guidance and aseptic technique, an L3-4 lumbar puncture was performed with a single pass of a 20-gauge spinal needle.  Initial blood tinged cerebral spinal fluid cleared.  Total of 10 ml of cerebrospinal fluid collected and sent for labs as per request.  Opening pressure not obtained.  No immediate complications.  IMPRESSION: Successful L3-4 lumbar puncture as noted above.   Original Report Authenticated By: Lacy Duverney, M.D.    Labs:  Basic Metabolic Panel:  Recent Labs Lab 06/11/13 0420 06/12/13 0432 06/13/13 0414 06/14/13 0432  NA 135 136 136 138  K 3.6 3.2* 3.2* 3.7  CL 102 101 103 104  CO2 24 25 23 24   GLUCOSE 87 88 99 98  BUN 9 9 13 11   CREATININE 0.66 0.63 0.72 0.68  CALCIUM 8.9 9.0 8.8 9.3   GFR Estimated Creatinine Clearance: 59.6 ml/min (by C-G formula based on Cr of 0.68).  CBC:  Recent Labs Lab 06/11/13 0420 06/12/13 0432 06/14/13 0432  WBC 4.7 5.2 5.3  HGB 10.5* 10.7* 11.2*  HCT 31.8* 33.1* 33.6*  MCV 86.9 86.4 87.0  PLT 146* 156 184   Microbiology Recent Results (from the past 240 hour(s))  URINE CULTURE     Status: None   Collection Time    06/08/13  1:55 PM      Result Value Range Status   Specimen Description URINE, CATHETERIZED  Final   Special Requests NONE   Final   Culture  Setup Time 06/09/2013 00:20   Final   Colony Count >=100,000 COLONIES/ML   Final   Culture ENTEROCOCCUS SPECIES   Final   Report Status 06/10/2013 FINAL   Final   Organism ID, Bacteria ENTEROCOCCUS SPECIES   Final  CULTURE, BLOOD (ROUTINE X 2)     Status: None   Collection Time    06/08/13  2:00 PM      Result Value Range Status   Specimen  Description BLOOD LEFT HAND   Final   Special Requests BOTTLES DRAWN AEROBIC ONLY 3CC   Final   Culture  Setup Time 06/09/2013 00:51   Final   Culture NO GROWTH 5 DAYS   Final   Report Status 06/15/2013 FINAL   Final  CULTURE, BLOOD (ROUTINE X 2)     Status: None   Collection Time    06/08/13  2:05 PM      Result Value Range Status   Specimen Description BLOOD LEFT ARM   Final   Special Requests BOTTLES DRAWN AEROBIC AND ANAEROBIC 5CC   Final   Culture  Setup Time 06/09/2013 00:51   Final   Culture NO GROWTH 5 DAYS   Final   Report Status 06/15/2013 FINAL   Final  CSF CULTURE     Status: None   Collection Time    06/08/13  6:22 PM      Result Value Range Status   Specimen Description CSF   Final   Special Requests Normal   Final   Gram Stain     Final   Value: NO WBC SEEN     NO ORGANISMS SEEN     CYTOSPIN   Culture NO GROWTH 3 DAYS   Final   Report Status 06/12/2013 FINAL   Final  GRAM STAIN     Status: None   Collection Time    06/08/13  6:24 PM      Result Value Range Status   Specimen Description CSF   Final   Special Requests Normal   Final   Gram Stain     Final   Value: NO ORGANISMS SEEN     NO WBC SEEN     CYTOSPIN SAMPLE     Gram Stain Report Called to,Read Back By and Verified With: NEASE,A. AT 1948 ON 07.23.14 BY LOVE,T.   Report Status 06/08/2013 FINAL   Final  URINE CULTURE     Status: None   Collection Time    06/15/13  7:52 PM      Result Value Range Status   Specimen Description URINE, CATHETERIZED   Final   Special Requests None   Final   Culture  Setup Time 06/15/2013 23:19   Final   Colony Count NO GROWTH   Final   Culture NO GROWTH   Final   Report Status 06/16/2013 FINAL   Final    Time coordinating discharge: 35 minutes.  Signed:  Davelyn Gwinn  Pager [phone] [facility] 06/17/2013, 11:36 AM

## 2013-06-17 NOTE — Progress Notes (Signed)
Physical Therapy Treatment Patient Details Name: DIYARI CHERNE MRN: [id number] DOB: May 02, 1917 Today's Date: 06/17/2013 Time: 7829-5621 PT Time Calculation (min): 18 min  PT Assessment / Plan / Recommendation  History of Present Illness     PT Comments   Pt has been cleared for D/C.  Son here to take pt home.  Assisted with sit to stand for dressing.  Assisted son with amb.  Son very attentive and knowledgeable about pt.    Follow Up Recommendations  Home health PT (family taking pt home)     Does the patient have the potential to tolerate intense rehabilitation     Barriers to Discharge        Equipment Recommendations       Recommendations for Other Services    Frequency Min 2X/week   Progress towards PT Goals    Plan      Precautions / Restrictions Precautions Precautions: Fall Precaution Comments: Hx Alzheimers Required Braces or Orthoses: Other Brace/Splint Other Brace/Splint: pt had on B PRAFO for in bed positioning and heel protection/foot drop Restrictions Weight Bearing Restrictions: No    Pertinent Vitals/Pain No c/o pain    Mobility  Bed Mobility Bed Mobility: Not assessed Details for Bed Mobility Assistance: pt sitting EOB on arrival with NT assisting and son present  Transfers Transfers: Sit to Stand;Stand to Sit Sit to Stand: 1: +2 Total assist;From bed Sit to Stand: Patient Percentage: 30% Stand to Sit: 1: +2 Total assist;To chair/3-in-1 Stand to Sit: Patient Percentage: 10% Details for Transfer Assistance: Once upright pt is able to stand but offers no self assist to get there.  Poor standing balance and poor standing posture of flexed hips, knees and forward flex cervical posture.  Pt also rigid/stiff throughout.  Ambulation/Gait Ambulation/Gait Assistance: 1: +2 Total assist Ambulation/Gait: Patient Percentage: 30% Ambulation Distance (Feet): 6 Feet Assistive device: Rolling walker Ambulation/Gait Assistance Details: + 2 assist HHA with  assistance to also weight shift and increased time as pt takes very small toe shuffled steps. Very rigid throughout with flexed hips and knees and forward lean. #rd assist needed to follow with chair. Gait Pattern: Step-through pattern;Decreased step length - right;Decreased step length - left;Narrow base of support Gait velocity: very slow General Gait Details: Very rigid throughout.  HIGH FALL RISK    PT Goals (current goals can now be found in the care plan section)    Visit Information  Last PT Received On: 06/17/13 Assistance Needed: +2    Subjective Data      Cognition       Balance     End of Session PT - End of Session Equipment Utilized During Treatment: Gait belt Activity Tolerance: Patient tolerated treatment well Patient left: in chair;with call bell/phone within reach   Felecia Shelling  PTA [facility]  Acute  Rehab Pager      [phone]

## 2013-06-29 ENCOUNTER — Other Ambulatory Visit: Payer: Self-pay | Admitting: Geriatric Medicine

## 2013-06-29 MED ORDER — LEVOTHYROXINE SODIUM 25 MCG PO TABS: 25.0000 ug | ORAL_TABLET | Freq: Every day | ORAL | Status: DC

## 2013-07-04 ENCOUNTER — Other Ambulatory Visit: Payer: Self-pay | Admitting: Geriatric Medicine

## 2013-07-04 MED ORDER — LEVOTHYROXINE SODIUM 25 MCG PO TABS: 25.0000 ug | ORAL_TABLET | Freq: Every day | ORAL | Status: DC

## 2013-07-04 MED ORDER — ATORVASTATIN CALCIUM 10 MG PO TABS: 10.0000 mg | ORAL_TABLET | Freq: Every day | ORAL | Status: DC

## 2013-07-04 MED ORDER — HYDRALAZINE HCL 25 MG PO TABS: 12.5000 mg | ORAL_TABLET | Freq: Two times a day (BID) | ORAL | Status: DC

## 2013-07-05 ENCOUNTER — Ambulatory Visit (INDEPENDENT_AMBULATORY_CARE_PROVIDER_SITE_OTHER): Payer: Medicare HMO | Admitting: Neurology

## 2013-07-05 ENCOUNTER — Encounter: Payer: Self-pay | Admitting: Neurology

## 2013-07-05 VITALS — BP 137/75 | HR 80 | Temp 99.2°F

## 2013-07-05 DIAGNOSIS — G238 Other specified degenerative diseases of basal ganglia: Secondary | ICD-10-CM

## 2013-07-05 DIAGNOSIS — G231 Progressive supranuclear ophthalmoplegia [Steele-Richardson-Olszewski]: Secondary | ICD-10-CM

## 2013-07-05 DIAGNOSIS — N390 Urinary tract infection, site not specified: Secondary | ICD-10-CM

## 2013-07-05 DIAGNOSIS — R413 Other amnesia: Secondary | ICD-10-CM

## 2013-07-05 DIAGNOSIS — Z9889 Other specified postprocedural states: Secondary | ICD-10-CM

## 2013-07-05 DIAGNOSIS — R269 Unspecified abnormalities of gait and mobility: Secondary | ICD-10-CM

## 2013-07-05 DIAGNOSIS — R296 Repeated falls: Secondary | ICD-10-CM

## 2013-07-05 DIAGNOSIS — Z9181 History of falling: Secondary | ICD-10-CM

## 2013-07-05 NOTE — Patient Instructions (Addendum)
Continue your medications. Drink plenty of fluid. Add protein milk shakes, such as Boost or Ensure to your diet.

## 2013-07-05 NOTE — Progress Notes (Signed)
Subjective:    Patient ID: Ricky [name] is a 79 y.o. male.  HPI  Interim history:   Ricky [name] is a very pleasant 79 year old right-handed gentleman who presents for followup consultation of his parkinsonism. He is accompanied by his son, [name], today. I first met him on 04/01/2013 at which time I felt that the constellation of parkinsonism, memory loss, gait dysfunction, balance issues, recurrent falls and syncopal spells as well as a history of recurrent urinary tract infections most likely pointed towards a diagnosis of PSP, or progressive supranuclear palsy. He has an underlying complex medical history of pneumonia, pulmonary embolism in 2000 and, UTI, and memory loss and previously followed with Dr. Fayrene Fearing love. He was last seen by him on 12/03/2012. He has a history of blackout spells for the past 4+ years as well as memory loss. The first episode of loss of consciousness was noted in March 2010 at which time his son found him down on the floor rigid with his head flexed and his right hand trembling. He was admitted to the hospital for 3 days and diagnosed with vasovagal syncope and had workup including chest x-ray which showed cardiomegaly, head CT without contrast which showed chronic small vessel disease, MRI brain without contrast which showed no acute abnormality and chronic small vessel disease, MRA was normal, echocardiogram showed an EF of 60%. He had pneumonia, complicated by a PE in 4/10 and repeat MRI brain in 1/11, showed generalized atrophy and MRA showed mild stenosis of the L MCA. He has been tried on Aricept, but this was stopped d/t bradycardia and Sinemet was tried and discontinued as well. He has had recurrent pneumonia and recurrent UTIs and had a Foley catheter placed. He was at [facility] last year in April. He developed pressure ulcers on his feet. He has a CNA daily to help him bathe and dress and he has supervision 24-7. He was taken off of Neupro patch during the most  recent hospitalization for UTI and his mental functioning improved.  In the interim he was admitted to the hospital on 06/08/2013 and discharged on 06/17/2013. He was admitted with fever and altered mental status. He was treated for a urinary tract infection with IV vancomycin and had an LP with CSF cultures negative. He has been slow to recover from his recent hospital stay. He has not been using his walker as well and is using his WC more and more. At home, his son helps him walk, by holding onto him with both hands. He has freezing with thresholds and with narrow spaces.   His Past Medical History Is Significant For: Past Medical History  Diagnosis Date  . IBS (irritable bowel syndrome)   . Diverticulosis   . Cataract   . OAB (overactive bladder)   . Macular degeneration   . Alzheimer disease   . Hypertension   . Arthritis   . Osteoporosis   . Parkinsonian syndrome   . Syncope, vasovagal   . Neuromuscular disorder     parkisonian syndrome  . 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 Unspecified transient cerebral ischemia   . Unspecified urinary incontinence   . Senile dementia, uncomplicated   . Unspecified constipation   . Hypothyroidism   . Anemia, unspecified   . Depression   . Alzheimer's disease   . Gout, unspecified   . Thoracic or lumbosacral neuritis or radiculitis, unspecified   . Progressive supranuclear palsy   . Unspecified transient cerebral ischemia   .  Unspecified urinary incontinence   . Syncope and collapse   . Paralysis agitans   . Unspecified transient cerebral ischemia   . Pneumonitis due to inhalation of food or vomitus   . Cellulitis and abscess of oral soft tissues   . Screening for lipoid disorders   . Senile dementia, uncomplicated   . Unspecified constipation   . Disorder of bone and cartilage, unspecified   . Other specified acquired hypothyroidism   . Unspecified hypothyroidism   . Anemia, unspecified   .  Depressive disorder, not elsewhere classified   . Alzheimer's disease   . Paralysis agitans   . Pneumonia, organism unspecified   . Pseudomonas infection in conditions classified elsewhere and of unspecified site   . Unspecified essential hypertension   . Urinary tract infection, site not specified   . Osteoarthrosis, unspecified whether generalized or localized, unspecified site   . Osteoporosis, unspecified   . Gout, unspecified   . Thoracic or lumbosacral neuritis or radiculitis, unspecified     His Past Surgical History Is Significant For: Past Surgical History  Procedure Laterality Date  . Esophagogastroduodenoscopy    . Eye surgery  2006    LEFT CATARACT    His Family History Is Significant For: Family History  Problem Relation Age of Onset  . Cancer Mother   . Stroke Father   . Liver disease Father   . Kidney disease Brother   . Liver disease Brother   . Emphysema Brother     His Social History Is Significant For: History   Social History  . Marital Status: Single    Spouse Name: N/A    Number of Children: N/A  . Years of Education: N/A   Social History Main Topics  . Smoking status: Never Smoker   . Smokeless tobacco: Never Used  . Alcohol Use: No  . Drug Use: No  . Sexual Activity: No   Other Topics Concern  . None   Social History Narrative  . None    His Allergies Are:  No Known Allergies:   His Current Medications Are:  Outpatient Encounter Prescriptions as of 07/05/2013  Medication Sig Dispense Refill  . acetaminophen (TYLENOL) 500 MG tablet Take 500 mg by mouth 2 (two) times daily.       Marland Kitchen aspirin EC 325 MG EC tablet Take 1 tablet (325 mg total) by mouth daily.  30 tablet  3  . atorvastatin (LIPITOR) 10 MG tablet Take 1 tablet (10 mg total) by mouth daily at 6 PM.  90 tablet  3  . bacitracin ointment Apply 1 application topically 2 (two) times daily. Applied to tip of penis for skin tear.      . Calcium Carbonate-Vit D-Min (CALTRATE 600+D  PLUS) 600-800 MG-UNIT CHEW Chew 1 tablet by mouth 2 (two) times daily with a meal.       . CRANBERRY PO Take 4,200 mg by mouth 3 (three) times daily.      . Cyanocobalamin 2500 MCG TABS Take 1 tablet by mouth 2 (two) times daily.      . D-Mannose POWD Take 2,000 mg by mouth 2 (two) times daily.      . ferrous sulfate 325 (65 FE) MG tablet Take 325 mg by mouth every other day.       Marland Kitchen guaiFENesin (MUCINEX) 600 MG 12 hr tablet Take 600 mg by mouth 2 (two) times daily as needed for congestion.       . hydrALAZINE (APRESOLINE) 25 MG tablet Take  0.5 tablets (12.5 mg total) by mouth 2 (two) times daily.  45 tablet  3  . lactose free nutrition (BOOST) LIQD Take 90 mLs by mouth 2 (two) times daily between meals.       Marland Kitchen levothyroxine (SYNTHROID, LEVOTHROID) 25 MCG tablet Take 1 tablet (25 mcg total) by mouth daily.  90 tablet  3  . MELATONIN EXTRA STRENGTH PO Take 15 mLs by mouth at bedtime as needed (sleep).       . memantine (NAMENDA) 10 MG tablet Take 10 mg by mouth 2 (two) times daily.       . Multiple Vitamin (MULTIVITAMIN WITH MINERALS) TABS Take 1 tablet by mouth daily.      . Multiple Vitamins-Minerals (ICAPS AREDS FORMULA PO) Take 2 tablets by mouth 2 (two) times daily.      . polyethylene glycol (MIRALAX / GLYCOLAX) packet Take 17 g by mouth daily.      . Probiotic Product (PROBIOTIC DAILY PO) Take 1 tablet by mouth daily.      . [DISCONTINUED] potassium chloride SA (K-DUR,KLOR-CON) 20 MEQ tablet Take 1 tablet (20 mEq total) by mouth daily.  5 tablet  0  . [DISCONTINUED] cetirizine (ZYRTEC) 10 MG tablet Take 10 mg by mouth daily as needed for allergies.        No facility-administered encounter medications on file as of 07/05/2013.  : Review of Systems  Constitutional: Positive for fatigue.  HENT: Positive for hearing loss and trouble swallowing.   Genitourinary:       Incontinence  Neurological: Positive for speech difficulty and weakness.  Psychiatric/Behavioral: Positive for sleep  disturbance.       Restless Legs   Objective:  Neurologic Exam  Physical Exam  Physical Examination:   Filed Vitals:   07/05/13 1514  BP: 137/75  Pulse: 80  Temp: 99.2 F (37.3 C)   General Examination: The patient is a very pleasant 79 y.o. male in no acute distress.  HEENT: Normocephalic, atraumatic, pupils are equal, round and reactive to light and accommodation. Extraocular tracking shows severe saccadic breakdown without nystagmus noted. There is severe limitation to entire gaze. There is moderate decrease in eye blink rate. Hearing is impaired. Face is symmetric with moderate facial masking and normal facial sensation. There is no lip, neck or jaw tremor. Neck is severely rigid with decreased passive ROM. There are no carotid bruits on auscultation. Oropharynx exam reveals moderate mouth dryness. No significant airway crowding is noted. Mallampati is class III. Tongue protrudes centrally and palate elevates symmetrically. There is no drooling.   Chest: is clear to auscultation without wheezing, rhonchi or crackles noted.  Heart: sounds are regular and normal without murmurs, rubs or gallops noted.   Abdomen: is soft, non-tender and non-distended with normal bowel sounds appreciated on auscultation.  Extremities: There is 1+ pitting edema in the distal lower extremities bilaterally.   Skin: is warm and dry with no trophic changes noted. Age-related changes are noted on the skin.   Musculoskeletal: exam reveals no obvious joint deformities, tenderness, joint swelling or erythema.  Neurologically:  Mental status: The patient is awake and alert, paying poor attention. He is unable to provide the history. His son provides the entire history. He is oriented to: self, place and situation. His memory, attention, language and knowledge are impaired. There is no aphasia, agnosia, apraxia or anomia. There is a moderate degree of bradyphrenia. Speech is very scant, moderately hypophonic  with mild dysarthria noted. Mood is congruent and affect is  blunted.   Cranial nerves are as described above under HEENT exam.   Motor exam: Normal bulk, and strength for age is noted. There are no dyskinesias noted.   Tone is severely rigid with absence of cogwheeling. There is overall severe bradykinesia. There is no drift or rebound. There is no tremor.  Reflexes are trace in the upper extremities and trace in the lower extremities. Toes are downgoing bilaterally.  Fine motor skills exam: Finger taps are severely impaired on the right and moderately impaired on the left. Hand movements are moderately impaired on the right and moderately impaired on the left. RAP (rapid alternating patting) is moderately impaired on the right and moderately impaired on the left. Foot taps are severely impaired on the right and severely impaired on the left. Foot agility (in the form of heel stomping) is severely impaired on the right and severely impaired on the left.    Cerebellar testing is not possible.   Sensory exam is intact to light touch.   Gait, station and balance: He was not asked to stand or walk as they did not bring his walker and I did not think it was safe for him to stand or walk.    Assessment and Plan:   In summary, DARVIN DIALS is a very pleasant 79 y.o.-year old male with a history of syncopal spells, parkinsonism, memory loss, gait dysfunction, balance issues, recurrent falls and recurrent urinary tract infections. His exam is consistent with progressive supranuclear palsy, advanced.  I had a long chat with the patient and particularly his son, [name], today about my findings and the diagnosis of PSP, the prognosis and treatment options. We talked about medical treatments and non-pharmacological approaches. I encouraged them to keep him well hydrated, keep a scheduled bedtime and wake time routine, to not skip any meals and eat healthy snacks in between meals and to have protein with  every meal. He has been tried on Sinemet in the past, Aricept as well it is most recently on rotigotine. I suggested that we not try him on another antiparkinsonian medication at this time. I would continue with the Namenda. He does need 24-7 supervision which he is getting at this point. I would like to see him back routinely in 6 months, sooner if the need arises and encouraged them to call with any interim questions, concerns, problems or updates and refill requests. Unfortunately, there is not a whole lot I can offer at this point.

## 2013-07-12 ENCOUNTER — Emergency Department (HOSPITAL_COMMUNITY)
Admission: EM | Admit: 2013-07-12 | Discharge: 2013-07-12 | Disposition: A | Discharge status: Home / Self Care | Payer: Medicare HMO | Attending: Emergency Medicine | Admitting: Emergency Medicine

## 2013-07-12 ENCOUNTER — Encounter (HOSPITAL_COMMUNITY): Payer: Self-pay | Admitting: Emergency Medicine

## 2013-07-12 DIAGNOSIS — Z862 Personal history of diseases of the blood and blood-forming organs and certain disorders involving the immune mechanism: Secondary | ICD-10-CM | POA: Insufficient documentation

## 2013-07-12 DIAGNOSIS — Z8669 Personal history of other diseases of the nervous system and sense organs: Secondary | ICD-10-CM | POA: Insufficient documentation

## 2013-07-12 DIAGNOSIS — Z8719 Personal history of other diseases of the digestive system: Secondary | ICD-10-CM | POA: Insufficient documentation

## 2013-07-12 DIAGNOSIS — Z8619 Personal history of other infectious and parasitic diseases: Secondary | ICD-10-CM | POA: Insufficient documentation

## 2013-07-12 DIAGNOSIS — G20 Parkinson's disease: Secondary | ICD-10-CM | POA: Insufficient documentation

## 2013-07-12 DIAGNOSIS — Z79899 Other long term (current) drug therapy: Secondary | ICD-10-CM | POA: Insufficient documentation

## 2013-07-12 DIAGNOSIS — E039 Hypothyroidism, unspecified: Secondary | ICD-10-CM | POA: Insufficient documentation

## 2013-07-12 DIAGNOSIS — Z8673 Personal history of transient ischemic attack (TIA), and cerebral infarction without residual deficits: Secondary | ICD-10-CM | POA: Insufficient documentation

## 2013-07-12 DIAGNOSIS — Z87440 Personal history of urinary (tract) infections: Secondary | ICD-10-CM | POA: Insufficient documentation

## 2013-07-12 DIAGNOSIS — M1990 Unspecified osteoarthritis, unspecified site: Secondary | ICD-10-CM | POA: Insufficient documentation

## 2013-07-12 DIAGNOSIS — F0280 Dementia in other diseases classified elsewhere without behavioral disturbance: Secondary | ICD-10-CM | POA: Insufficient documentation

## 2013-07-12 DIAGNOSIS — G20A1 Parkinson's disease without dyskinesia, without mention of fluctuations: Secondary | ICD-10-CM | POA: Insufficient documentation

## 2013-07-12 DIAGNOSIS — M129 Arthropathy, unspecified: Secondary | ICD-10-CM | POA: Insufficient documentation

## 2013-07-12 DIAGNOSIS — G309 Alzheimer's disease, unspecified: Secondary | ICD-10-CM | POA: Insufficient documentation

## 2013-07-12 DIAGNOSIS — I498 Other specified cardiac arrhythmias: Secondary | ICD-10-CM | POA: Insufficient documentation

## 2013-07-12 DIAGNOSIS — K5900 Constipation, unspecified: Secondary | ICD-10-CM | POA: Insufficient documentation

## 2013-07-12 DIAGNOSIS — I10 Essential (primary) hypertension: Secondary | ICD-10-CM | POA: Insufficient documentation

## 2013-07-12 DIAGNOSIS — Z87448 Personal history of other diseases of urinary system: Secondary | ICD-10-CM | POA: Insufficient documentation

## 2013-07-12 DIAGNOSIS — D649 Anemia, unspecified: Secondary | ICD-10-CM | POA: Insufficient documentation

## 2013-07-12 DIAGNOSIS — R55 Syncope and collapse: Secondary | ICD-10-CM | POA: Insufficient documentation

## 2013-07-12 DIAGNOSIS — Z8739 Personal history of other diseases of the musculoskeletal system and connective tissue: Secondary | ICD-10-CM | POA: Insufficient documentation

## 2013-07-12 DIAGNOSIS — Z7982 Long term (current) use of aspirin: Secondary | ICD-10-CM | POA: Insufficient documentation

## 2013-07-12 DIAGNOSIS — Z8701 Personal history of pneumonia (recurrent): Secondary | ICD-10-CM | POA: Insufficient documentation

## 2013-07-12 DIAGNOSIS — Z8639 Personal history of other endocrine, nutritional and metabolic disease: Secondary | ICD-10-CM | POA: Insufficient documentation

## 2013-07-12 DIAGNOSIS — Z8659 Personal history of other mental and behavioral disorders: Secondary | ICD-10-CM | POA: Insufficient documentation

## 2013-07-12 HISTORY — DX: Cerebral infarction, unspecified: I63.9

## 2013-07-12 LAB — CBC WITH DIFFERENTIAL/PLATELET
Basophils Absolute: 0 10*3/uL (ref 0.0–0.1)
Eosinophils Relative: 1 % (ref 0–5)
Lymphocytes Relative: 18 % (ref 12–46)
Neutro Abs: 5.4 10*3/uL (ref 1.7–7.7)
Neutrophils Relative %: 73 % (ref 43–77)
Platelets: 164 10*3/uL (ref 150–400)
RBC: 4.01 MIL/uL — ABNORMAL LOW (ref 4.22–5.81)
RDW: 13.9 % (ref 11.5–15.5)
WBC: 7.4 10*3/uL (ref 4.0–10.5)

## 2013-07-12 LAB — URINALYSIS, ROUTINE W REFLEX MICROSCOPIC
Bilirubin Urine: NEGATIVE
Glucose, UA: NEGATIVE mg/dL
Protein, ur: 30 mg/dL — AB

## 2013-07-12 LAB — BASIC METABOLIC PANEL
BUN: 9 mg/dL (ref 6–23)
Calcium: 9.7 mg/dL (ref 8.4–10.5)
GFR calc non Af Amer: 63 mL/min — ABNORMAL LOW (ref >90)
Glucose, Bld: 90 mg/dL (ref 70–99)
Sodium: 141 mEq/L (ref 135–145)

## 2013-07-12 LAB — POCT I-STAT TROPONIN I: Troponin i, poc: 0.03 ng/mL (ref 0.00–0.08)

## 2013-07-12 LAB — URINE MICROSCOPIC-ADD ON

## 2013-07-12 NOTE — ED Notes (Signed)
Per EMS-pt from home with c/o of syncopal episode this am while using restroom. Hx of same. Pt AAO x4 per family. Denies pain or injury.

## 2013-07-12 NOTE — ED Provider Notes (Signed)
CSN: [id number]     Arrival date & time 07/12/13  0935 History   First MD Initiated Contact with Patient 07/12/13 [id number]     Chief Complaint  Patient presents with  . Loss of Consciousness    HPI   She was at home with his son. He has a visiting home nurse every morning. He has a long history of syncope. Has a bradycardia. Was not a  a candidate for pacemaker. Multiple descriptions of  vasovagal episodes of syncope in his cardiology chart. Describes an episode this morning the patient was talked about having had a bowel movement over about a half an hours time. Your helping him to the bathroom. He had a large bowel movement as he was sitting onto the toilet. He had a vagal response. He became slightly diaphoretic. He had syncope. He was laid supine. He is awakening on arrival of paramedics within the next 10 minutes. He denies chest pain. He has been awake alert and at his baseline level of consciousness per his son's report since that time. Past Medical History  Diagnosis Date  . IBS (irritable bowel syndrome)   . Diverticulosis   . Cataract   . OAB (overactive bladder)   . Macular degeneration   . Alzheimer disease   . Hypertension   . Arthritis   . Osteoporosis   . Parkinsonian syndrome   . Syncope, vasovagal   . Neuromuscular disorder     parkisonian syndrome  . 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 Unspecified transient cerebral ischemia   . Unspecified urinary incontinence   . Senile dementia, uncomplicated   . Unspecified constipation   . Hypothyroidism   . Anemia, unspecified   . Depression   . Alzheimer's disease   . Gout, unspecified   . Thoracic or lumbosacral neuritis or radiculitis, unspecified   . Progressive supranuclear palsy   . Unspecified transient cerebral ischemia   . Unspecified urinary incontinence   . Syncope and collapse   . Paralysis agitans   . Unspecified transient cerebral ischemia   . Pneumonitis due to inhalation  of food or vomitus   . Cellulitis and abscess of oral soft tissues   . Screening for lipoid disorders   . Senile dementia, uncomplicated   . Unspecified constipation   . Disorder of bone and cartilage, unspecified   . Other specified acquired hypothyroidism   . Unspecified hypothyroidism   . Anemia, unspecified   . Depressive disorder, not elsewhere classified   . Alzheimer's disease   . Paralysis agitans   . Pneumonia, organism unspecified   . Pseudomonas infection in conditions classified elsewhere and of unspecified site   . Unspecified essential hypertension   . Urinary tract infection, site not specified   . Osteoarthrosis, unspecified whether generalized or localized, unspecified site   . Osteoporosis, unspecified   . Gout, unspecified   . Thoracic or lumbosacral neuritis or radiculitis, unspecified   . Stroke    Past Surgical History  Procedure Laterality Date  . Esophagogastroduodenoscopy    . Eye surgery  2006    LEFT CATARACT   Family History  Problem Relation Age of Onset  . Cancer Mother   . Stroke Father   . Liver disease Father   . Kidney disease Brother   . Liver disease Brother   . Emphysema Brother    History  Substance Use Topics  . Smoking status: Never Smoker   . Smokeless tobacco: Never Used  . Alcohol  Use: No    Review of Systems  Constitutional: Negative for fever, chills, diaphoresis, appetite change and fatigue.  HENT: Negative for sore throat, mouth sores and trouble swallowing.   Eyes: Negative for visual disturbance.  Respiratory: Negative for cough, chest tightness, shortness of breath and wheezing.   Cardiovascular: Negative for chest pain.       Episode of syncope. No recent pain. No dyspnea.  Gastrointestinal: Positive for constipation. Negative for nausea, vomiting, abdominal pain, diarrhea and abdominal distention.       His intake and output have been normal. No diarrhea. No "constipation" until the episode this morning   Endocrine: Negative for polydipsia, polyphagia and polyuria.  Genitourinary: Negative for dysuria, frequency and hematuria.       Normal catheter out yesterday. Since take 1750 over the course of the day.  Musculoskeletal: Negative for gait problem.  Skin: Negative for color change, pallor and rash.  Neurological: Negative for dizziness, syncope, light-headedness and headaches.  Hematological: Does not bruise/bleed easily.  Psychiatric/Behavioral: Negative for behavioral problems and confusion.    Allergies  Review of patient's allergies indicates no known allergies.  Home Medications   Current Outpatient Rx  Name  Route  Sig  Dispense  Refill  . acetaminophen (TYLENOL) 500 MG tablet   Oral   Take 500 mg by mouth 2 (two) times daily.          Marland Kitchen aspirin EC 325 MG EC tablet   Oral   Take 1 tablet (325 mg total) by mouth daily.   30 tablet   3   . atorvastatin (LIPITOR) 10 MG tablet   Oral   Take 1 tablet (10 mg total) by mouth daily at 6 PM.   90 tablet   3   . bacitracin ointment   Topical   Apply 1 application topically 2 (two) times daily. Applied to tip of penis for skin tear.         . Calcium Carbonate-Vit D-Min (CALTRATE 600+D PLUS) 600-800 MG-UNIT CHEW   Oral   Chew 1 tablet by mouth 2 (two) times daily with a meal.          . CRANBERRY PO   Oral   Take 4,200 mg by mouth 3 (three) times daily.         . Cyanocobalamin 2500 MCG TABS   Oral   Take 1 tablet by mouth 2 (two) times daily.         . D-Mannose POWD   Oral   Take 2,000 mg by mouth 2 (two) times daily.         . ferrous sulfate 325 (65 FE) MG tablet   Oral   Take 325 mg by mouth every other day.          Marland Kitchen guaiFENesin (MUCINEX) 600 MG 12 hr tablet   Oral   Take 600 mg by mouth 2 (two) times daily as needed for congestion.          . hydrALAZINE (APRESOLINE) 25 MG tablet   Oral   Take 0.5 tablets (12.5 mg total) by mouth 2 (two) times daily.   45 tablet   3   . lactose  free nutrition (BOOST) LIQD   Oral   Take 90 mLs by mouth 2 (two) times daily between meals.          Marland Kitchen levothyroxine (SYNTHROID, LEVOTHROID) 25 MCG tablet   Oral   Take 1 tablet (25 mcg total) by mouth daily.  90 tablet   3   . MELATONIN EXTRA STRENGTH PO   Oral   Take 15 mLs by mouth at bedtime as needed (sleep).          . memantine (NAMENDA) 10 MG tablet   Oral   Take 10 mg by mouth 2 (two) times daily.          . Multiple Vitamin (MULTIVITAMIN WITH MINERALS) TABS   Oral   Take 1 tablet by mouth daily.         . Multiple Vitamins-Minerals (ICAPS AREDS FORMULA PO)   Oral   Take 2 tablets by mouth 2 (two) times daily.         . polyethylene glycol (MIRALAX / GLYCOLAX) packet   Oral   Take 17 g by mouth daily.         . Probiotic Product (PROBIOTIC DAILY PO)   Oral   Take 1 tablet by mouth daily.          BP 165/81  Pulse 50  Temp(Src) 97.5 F (36.4 C) (Oral)  Resp 17  SpO2 97% Physical Exam  Constitutional: He is oriented to person, place, and time. He appears well-developed and well-nourished. No distress.  Is not diaphoretic. His attentive. He answers questions simply.  HENT:  Head: Normocephalic.  Eyes: Conjunctivae are normal. Pupils are equal, round, and reactive to light. No scleral icterus.  Neck: Normal range of motion. Neck supple. No thyromegaly present.  Cardiovascular: Normal rate and regular rhythm.  Exam reveals no gallop and no friction rub.   No murmur heard. Recurrent rhythm in the mid 50s on the monitor. Since that this is normal for him. I can confirm this with his chart. History of sinus bradycardia  Pulmonary/Chest: Effort normal and breath sounds normal. No respiratory distress. He has no wheezes. He has no rales.  Lungs are clear bilaterally.  Abdominal: Soft. Bowel sounds are normal. He exhibits no distension. There is no tenderness. There is no rebound.  Musculoskeletal: Normal range of motion.  Neurological: He is alert  and oriented to person, place, and time.  Symmetric neurological exam  Skin: Skin is warm and dry. No rash noted.  No edema no asymmetry  Psychiatric: He has a normal mood and affect. His behavior is normal.    ED Course  Procedures (including critical care time) Labs Review Labs Reviewed  BASIC METABOLIC PANEL - Abnormal; Notable for the following:    GFR calc non Af Amer 63 (*)    GFR calc Af Amer 73 (*)    All other components within normal limits  CBC WITH DIFFERENTIAL - Abnormal; Notable for the following:    RBC 4.01 (*)    Hemoglobin 11.4 (*)    HCT 36.1 (*)    All other components within normal limits  URINALYSIS, ROUTINE W REFLEX MICROSCOPIC - Abnormal; Notable for the following:    APPearance TURBID (*)    Hgb urine dipstick MODERATE (*)    Protein, ur 30 (*)    Leukocytes, UA LARGE (*)    All other components within normal limits  URINE MICROSCOPIC-ADD ON - Abnormal; Notable for the following:    Bacteria, UA FEW (*)    All other components within normal limits  URINE CULTURE  POCT I-STAT TROPONIN I  POCT I-STAT TROPONIN I   Imaging Review No results found.   EKG: Indication syncope. Patient junctional rhythm a rate of 52 no change versus comparison 01/27/2013. MDM   1. Vasovagal  syncope    Sounds like an episode of vasovagal syncope in a patient with a history of vasovagal syncope. More specifically this would be micturition syncope. Plan serial enzymes EKG electrolyte evaluation. Check urine.  He has a few cells in his urine. I don't think this appears overtly infected appointment. Treatment. Will await culture. Initial troponin and follow troponin I 3 arson normal. An change rhythm with his junctional rhythm on the monitor. I don't feel this needs further evaluation. Sig is been ongoing condition for him over the last 2 years. Plan a significant fall precautions. He was appropriate for outpatient treatment. He is been evaluated by cardiology in the past and  has been determined to not be a candidate for pacemaker    Claudean Kinds, MD 07/12/13 1344

## 2013-07-13 ENCOUNTER — Other Ambulatory Visit: Payer: Self-pay | Admitting: Nurse Practitioner

## 2013-07-13 LAB — URINE CULTURE: Colony Count: >=100000

## 2013-08-01 ENCOUNTER — Other Ambulatory Visit: Payer: Self-pay | Admitting: Nurse Practitioner

## 2013-08-02 ENCOUNTER — Other Ambulatory Visit: Payer: Self-pay | Admitting: Nurse Practitioner

## 2013-08-07 ENCOUNTER — Other Ambulatory Visit: Payer: Self-pay | Admitting: Nurse Practitioner

## 2013-08-07 DIAGNOSIS — F0280 Dementia in other diseases classified elsewhere without behavioral disturbance: Secondary | ICD-10-CM

## 2013-08-07 DIAGNOSIS — Z466 Encounter for fitting and adjustment of urinary device: Secondary | ICD-10-CM

## 2013-08-07 DIAGNOSIS — G238 Other specified degenerative diseases of basal ganglia: Secondary | ICD-10-CM

## 2013-08-07 DIAGNOSIS — G309 Alzheimer's disease, unspecified: Secondary | ICD-10-CM

## 2013-08-15 ENCOUNTER — Telehealth: Payer: Self-pay | Admitting: *Deleted

## 2013-08-15 NOTE — Telephone Encounter (Signed)
Called Alvino Chapel (nurse) to inform her that Dr. Renato Gails is fine with a urine specimen.

## 2013-08-15 NOTE — Telephone Encounter (Signed)
Message copied by RICE, SHARON L on Mon Aug 15, 2013 10:30 AM ------      Message from: [place], [place] L      Created: Mon Aug 15, 2013  9:05 AM       This is ok.  Unfortunately, he will probably have another infection and is resistant to most antibiotics.              ----- Message -----         From: Lamont Snowball, CMA         Sent: 08/12/2013   2:55 PM           To: Kermit Balo, DO            Nurse from [facility] called to report that patient had possible blood in urine and want to get a specimen on Monday for you. Is this ok with you.       ------

## 2013-08-16 ENCOUNTER — Other Ambulatory Visit: Payer: Self-pay | Admitting: *Deleted

## 2013-08-16 ENCOUNTER — Telehealth: Payer: Self-pay | Admitting: *Deleted

## 2013-08-16 DIAGNOSIS — J690 Pneumonitis due to inhalation of food and vomit: Secondary | ICD-10-CM

## 2013-08-16 NOTE — Telephone Encounter (Signed)
Home care nurse asked if a chest x-ray could be order for patient, aspirated over the weekend. Dr. Renato Gails agreed to rule out pneumonia.

## 2013-08-17 ENCOUNTER — Ambulatory Visit
Admission: RE | Admit: 2013-08-17 | Discharge: 2013-08-17 | Disposition: A | Discharge status: Home / Self Care | Payer: Medicare HMO | Source: Ambulatory Visit | Attending: Internal Medicine | Admitting: Internal Medicine

## 2013-08-17 DIAGNOSIS — J690 Pneumonitis due to inhalation of food and vomit: Secondary | ICD-10-CM

## 2013-08-18 ENCOUNTER — Other Ambulatory Visit: Payer: Self-pay | Admitting: *Deleted

## 2013-08-18 MED ORDER — SACCHAROMYCES BOULARDII 250 MG PO CAPS: ORAL_CAPSULE | ORAL | Status: DC

## 2013-08-18 MED ORDER — CIPROFLOXACIN HCL 500 MG PO TABS: 500.0000 mg | ORAL_TABLET | Freq: Two times a day (BID) | ORAL | Status: DC

## 2013-08-29 ENCOUNTER — Ambulatory Visit (INDEPENDENT_AMBULATORY_CARE_PROVIDER_SITE_OTHER): Payer: Medicare HMO | Admitting: Internal Medicine

## 2013-08-29 ENCOUNTER — Encounter: Payer: Self-pay | Admitting: Internal Medicine

## 2013-08-29 VITALS — BP 132/78 | HR 71 | Temp 98.5°F

## 2013-08-29 DIAGNOSIS — R410 Disorientation, unspecified: Secondary | ICD-10-CM

## 2013-08-29 DIAGNOSIS — E039 Hypothyroidism, unspecified: Secondary | ICD-10-CM

## 2013-08-29 DIAGNOSIS — G238 Other specified degenerative diseases of basal ganglia: Secondary | ICD-10-CM

## 2013-08-29 DIAGNOSIS — G231 Progressive supranuclear ophthalmoplegia [Steele-Richardson-Olszewski]: Secondary | ICD-10-CM

## 2013-08-29 DIAGNOSIS — T17908S Unspecified foreign body in respiratory tract, part unspecified causing other injury, sequela: Secondary | ICD-10-CM

## 2013-08-29 DIAGNOSIS — IMO0002 Reserved for concepts with insufficient information to code with codable children: Secondary | ICD-10-CM

## 2013-08-29 DIAGNOSIS — N390 Urinary tract infection, site not specified: Secondary | ICD-10-CM

## 2013-08-29 DIAGNOSIS — Z23 Encounter for immunization: Secondary | ICD-10-CM

## 2013-08-29 DIAGNOSIS — D509 Iron deficiency anemia, unspecified: Secondary | ICD-10-CM

## 2013-08-29 DIAGNOSIS — K5900 Constipation, unspecified: Secondary | ICD-10-CM

## 2013-08-29 MED ORDER — HYDRALAZINE HCL 25 MG PO TABS: 12.5000 mg | ORAL_TABLET | Freq: Two times a day (BID) | ORAL | Status: DC

## 2013-08-29 MED ORDER — LEVOTHYROXINE SODIUM 25 MCG PO TABS: ORAL_TABLET | ORAL | Status: DC

## 2013-08-29 MED ORDER — ATORVASTATIN CALCIUM 10 MG PO TABS: 10.0000 mg | ORAL_TABLET | Freq: Every day | ORAL | Status: DC

## 2013-08-29 NOTE — Progress Notes (Signed)
Patient ID: Ricky [name], male   DOB: [birth year], 79 y.o.   MRN: [id number] Location:  [facility] / [place] [facility]  Code Status: DNR   No Known Allergies  Chief Complaint  Patient presents with  . Medical Managment of Chronic Issues    3 month follow-up   . Behavior Problem    x 18 days ago, patient's son noticed a change in behavior- ? UTI, was started on Cipro and probiotic. Patient only improved slightly. Patient was told to d/c Cipro by Urologist and told to start Keflex    . URI    congestion x 2 weeks   . Immunizations    ? if ok to get flu vaccine, per son patient's temp today is considered a fever for him    HPI: Patient is a 79 y.o. black male with h/o progressive supranuclear palsy seen in the office today for f/u of chronic medical conditions.  He has been to the ER several times since I last saw him for syncope from large BM, UTIs with delirium.    Larey Seat last wed and had abrasion right side over rib area.  Home health nurse did not think it was serious.  Using aspercreme on it.  Still notices it sometimes, but hasn't affected his routine.    18 days now--change in behavior--difficulty feeding himself--missed mouth or plate, catheter looked buttery, called home health nurse, but could not come out until the weekend was over, was getting more behaviors by that Monday.  Also aspirating.  Urine sample taken ([facility])--submitted.  Called here for CXR.  Got done 2 wed ago.  Preliminary urine came back yeast infection and a little blood, but otherwise negative.  CXR--negative except some secretions present.  Behavior changes were worse--agitated and bug-eyed, asking a lot of questions.  Culture returned and put on cipro and probiotic.  Mild improvement over 2-3 days, then began to behave abnormally, improved again and went backwards.  On Friday, [facility] reviewed culture report and was sensitive, but changed him to keflex (Dr. Julien Girt).  Began keflex  Saturday.  Had 2 days of cipro left.  Motor functions abnormal typically when behaviors and mind are altered.  This time, his ability to control motor function are much worse, but mentation has gotten better.  Son is having to pick him up in dead man's carry to move him.  Could previously walk him around our office.  Also more slumped over than normal.  Was up late till 10pm last night and got up at 5am.  Normally in bed at 9 and up at 6:15 or till 8 if tired.  Speech has also been more dysarthric--slurred 10% of time.  Son says he does have a chronic stutter.    Review of Systems:  Review of Systems  Constitutional: Negative for fever and chills.  HENT: Positive for congestion.   Respiratory: Positive for cough and wheezing. Negative for sputum production and shortness of breath.   Cardiovascular: Negative for chest pain.  Gastrointestinal: Negative for constipation.  Genitourinary:       Frequent UTIs  Musculoskeletal: Positive for back pain and falls.  Skin: Negative for rash.  Neurological: Negative for seizures and loss of consciousness.  Endo/Heme/Allergies: Bruises/bleeds easily.  Psychiatric/Behavioral: Positive for memory loss.       PSP    Past Medical History  Diagnosis Date  . IBS (irritable bowel syndrome)   . Diverticulosis   . Cataract   . OAB (overactive bladder)   .  Macular degeneration   . Alzheimer disease   . Hypertension   . Arthritis   . Osteoporosis   . Parkinsonian syndrome   . Syncope, vasovagal   . Neuromuscular disorder     parkisonian syndrome  . 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 Unspecified transient cerebral ischemia   . Unspecified urinary incontinence   . Senile dementia, uncomplicated   . Unspecified constipation   . Hypothyroidism   . Anemia, unspecified   . Depression   . Alzheimer's disease   . Gout, unspecified   . Thoracic or lumbosacral neuritis or radiculitis, unspecified   . Progressive  supranuclear palsy   . Unspecified transient cerebral ischemia   . Unspecified urinary incontinence   . Syncope and collapse   . Paralysis agitans   . Unspecified transient cerebral ischemia   . Pneumonitis due to inhalation of food or vomitus   . Cellulitis and abscess of oral soft tissues   . Screening for lipoid disorders   . Senile dementia, uncomplicated   . Unspecified constipation   . Disorder of bone and cartilage, unspecified   . Other specified acquired hypothyroidism   . Unspecified hypothyroidism   . Anemia, unspecified   . Depressive disorder, not elsewhere classified   . Alzheimer's disease   . Paralysis agitans   . Pneumonia, organism unspecified   . Pseudomonas infection in conditions classified elsewhere and of unspecified site   . Unspecified essential hypertension   . Urinary tract infection, site not specified   . Osteoarthrosis, unspecified whether generalized or localized, unspecified site   . Osteoporosis, unspecified   . Gout, unspecified   . Thoracic or lumbosacral neuritis or radiculitis, unspecified   . Stroke     Past Surgical History  Procedure Laterality Date  . Esophagogastroduodenoscopy    . Eye surgery  2006    LEFT CATARACT    Social History:   reports that he has never smoked. He has never used smokeless tobacco. He reports that he does not drink alcohol or use illicit drugs.  Family History  Problem Relation Age of Onset  . Cancer Mother   . Stroke Father   . Liver disease Father   . Kidney disease Brother   . Liver disease Brother   . Emphysema Brother     Medications: Patient's Medications  New Prescriptions   No medications on file  Previous Medications   ACETAMINOPHEN (TYLENOL) 500 MG TABLET    Take 500 mg by mouth 2 (two) times daily.    ASPIRIN EC 325 MG EC TABLET    Take 1 tablet (325 mg total) by mouth daily.   ATORVASTATIN (LIPITOR) 10 MG TABLET    Take 1 tablet (10 mg total) by mouth daily at 6 PM.   BACITRACIN  OINTMENT    Apply 1 application topically 2 (two) times daily. Applied to tip of penis for skin tear.   CALCIUM CARBONATE-VIT D-MIN (CALTRATE 600+D PLUS) 600-800 MG-UNIT CHEW    Chew 1 tablet by mouth 2 (two) times daily with a meal.    CEPHALEXIN (KEFLEX) 250 MG CAPSULE    Take 250 mg by mouth 4 (four) times daily.   CRANBERRY PO    Take 4,200 mg by mouth 3 (three) times daily.   D-MANNOSE POWD    Take 2,000 mg by mouth 2 (two) times daily.   FERROUS SULFATE 325 (65 FE) MG TABLET    Take 325 mg by mouth every other day.  GUAIFENESIN (MUCINEX) 600 MG 12 HR TABLET    Take 600 mg by mouth 2 (two) times daily as needed for congestion.    HYDRALAZINE (APRESOLINE) 25 MG TABLET    Take 0.5 tablets (12.5 mg total) by mouth 2 (two) times daily.   LACTOSE FREE NUTRITION (BOOST) LIQD    Take 90 mLs by mouth 2 (two) times daily between meals.    LEVOTHYROXINE (SYNTHROID, LEVOTHROID) 25 MCG TABLET    TAKE 1 TABLET BY MOUTH DAILY   MELATONIN EXTRA STRENGTH PO    Take 15 mLs by mouth at bedtime as needed (sleep).    METHYLCOBALAMIN (B-12) 5000 MCG TBDP    Take by mouth daily.   MULTIPLE VITAMIN (MULTIVITAMIN WITH MINERALS) TABS    Take 1 tablet by mouth daily.   MULTIPLE VITAMINS-MINERALS (ICAPS AREDS FORMULA PO)    Take 2 tablets by mouth 2 (two) times daily.   NAMENDA 10 MG TABLET    TAKE 1 TABLET BY MOUTH TWICE A DAY TO PRESERVE MEMORY   POLYETHYLENE GLYCOL (MIRALAX / GLYCOLAX) PACKET    Take 17 g by mouth daily.   PROBIOTIC PRODUCT (PROBIOTIC PO)    Take by mouth. Ultra Flora: 1 by mouth daily   SACCHAROMYCES BOULARDII (FLORASTOR) 250 MG CAPSULE    Take 1 tablet by my mouth twice daily for 14 days.  Modified Medications   No medications on file  Discontinued Medications   CIPROFLOXACIN (CIPRO) 500 MG TABLET    Take 1 tablet (500 mg total) by mouth 2 (two) times daily.   CYANOCOBALAMIN 2500 MCG TABS    Take 1 tablet by mouth 2 (two) times daily.   PROBIOTIC PRODUCT (PROBIOTIC DAILY PO)    Take 1 tablet  by mouth daily.     Physical Exam: Filed Vitals:   08/29/13 0811  BP: 132/78  Pulse: 71  Temp: 98.5 F (36.9 C)  TempSrc: Oral  SpO2: 95%  Physical Exam  Constitutional: He appears well-developed and well-nourished. No distress.  Sits stooped over in chair, but denies feeling bad at all  HENT:  Head: Normocephalic and atraumatic.  Cardiovascular: Normal rate, regular rhythm, normal heart sounds and intact distal pulses.   Pulmonary/Chest: Effort normal and breath sounds normal. No respiratory distress.  Abdominal: Soft. Bowel sounds are normal. He exhibits no distension. There is no tenderness.  Musculoskeletal: He exhibits no edema and no tenderness.  Neurological:  Drowsy but arousable easily  Skin: Skin is warm and dry.  Psychiatric: He has a normal mood and affect.  Answers appropriately    Labs reviewed: Basic Metabolic Panel:  Recent Labs  56/21/30 1542  02/11/13 1722  06/13/13 0414 06/14/13 0432 07/12/13 1042  NA  --   < > 134*  < > 136 138 141  K  --   < > 4.0  < > 3.2* 3.7 4.1  CL  --   < > 99  < > 103 104 104  CO2  --   < > 26  < > 23 24 27   GLUCOSE  --   < > 100*  < > 99 98 90  BUN  --   < > 9  < > 13 11 9   CREATININE 0.85  < > 0.81  < > 0.72 0.68 1.00  CALCIUM  --   < > 9.1  < > 8.8 9.3 9.7  MG 2.0  --  2.2  --   --   --   --  PHOS  --   --  2.8  --   --   --   --   TSH 0.748  --   --   --   --   --   --   < > = values in this interval not displayed. Liver Function Tests:  Recent Labs  05/15/13 1150 05/16/13 0527 05/18/13 0500  AST 23 16 22   ALT 17 12 12   ALKPHOS 87 73 69  BILITOT 0.5 0.6 0.4  PROT 8.1 7.1 7.4  ALBUMIN 3.8 3.2* 3.0*   CBC:  Recent Labs  05/18/13 0500 06/08/13 1405  06/12/13 0432 06/14/13 0432 07/12/13 1042  WBC 5.0 12.1*  < > 5.2 5.3 7.4  NEUTROABS 2.1 10.6*  --   --   --  5.4  HGB 11.8* 12.0*  < > 10.7* 11.2* 11.4*  HCT 36.3* 37.0*  < > 33.1* 33.6* 36.1*  MCV 85.8 87.9  < > 86.4 87.0 90.0  PLT 167 181  < >  156 184 164  < > = values in this interval not displayed.  Lab Results  Component Value Date   HGBA1C 5.3 06/21/2012  Reviewed urine culture and CXR results.  Assessment/Plan 1. Subacute delirium -seems to still be ongoing despite UTI treatment -suspect this may be due to his chronic aspiration, as well and is colonized with bacteria in his bladder -may also have new physical baseline to his PSP  2. 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 UTI (urinary tract infection) -on cipro for 8 days, now completing keflex per urology recs -encouraging fluids, cranberry and on D-mannose herbal  4. Chronic pulmonary aspiration, sequela -may be cause of ongoing mental status changes  5. Unspecified constipation -bowels are moving ok with probiotics, prunes  6. Hypothyroidism -f/u tsh today and other routine labs due to meds  7.  Need for influenza vaccine -given today  Labs/tests ordered:  Iron panel, cbc, tsh, bmp Next appt:  3 mos

## 2013-08-30 LAB — CBC WITH DIFFERENTIAL/PLATELET
Basophils Absolute: 0 10*3/uL (ref 0.0–0.2)
Basos: 0 %
Eos: 1 %
Eosinophils Absolute: 0 10*3/uL (ref 0.0–0.4)
HCT: 35.1 % — ABNORMAL LOW (ref 37.5–51.0)
Hemoglobin: 11.4 g/dL — ABNORMAL LOW (ref 12.6–17.7)
Immature Grans (Abs): 0 10*3/uL (ref 0.0–0.1)
Immature Granulocytes: 0 %
Lymphocytes Absolute: 1.4 10*3/uL (ref 0.7–3.1)
Lymphs: 23 %
MCH: 28.1 pg (ref 26.6–33.0)
MCHC: 32.5 g/dL (ref 31.5–35.7)
MCV: 87 fL (ref 79–97)
Monocytes Absolute: 0.6 10*3/uL (ref 0.1–0.9)
Monocytes: 10 %
Neutrophils Absolute: 3.9 10*3/uL (ref 1.4–7.0)
Neutrophils Relative %: 66 %
RBC: 4.05 x10E6/uL — ABNORMAL LOW (ref 4.14–5.80)
RDW: 13.5 % (ref 12.3–15.4)
WBC: 6 10*3/uL (ref 3.4–10.8)

## 2013-08-30 LAB — BASIC METABOLIC PANEL
BUN/Creatinine Ratio: 8 — ABNORMAL LOW (ref 10–22)
BUN: 6 mg/dL — ABNORMAL LOW (ref 10–36)
CO2: 19 mmol/L (ref 18–29)
Calcium: 9.3 mg/dL (ref 8.6–10.2)
Chloride: 97 mmol/L (ref 97–108)
Creatinine, Ser: 0.77 mg/dL (ref 0.76–1.27)
GFR calc Af Amer: 90 mL/min/{1.73_m2} (ref >59)
GFR calc non Af Amer: 78 mL/min/{1.73_m2} (ref >59)
Glucose: 82 mg/dL (ref 65–99)
Potassium: 4.2 mmol/L (ref 3.5–5.2)
Sodium: 139 mmol/L (ref 134–144)

## 2013-08-30 LAB — FERRITIN: Ferritin: 498 ng/mL — ABNORMAL HIGH (ref 30–400)

## 2013-08-30 LAB — TSH: TSH: 1.18 u[IU]/mL (ref 0.450–4.500)

## 2013-08-30 LAB — IRON AND TIBC
Iron Saturation: 12 % — ABNORMAL LOW (ref 15–55)
Iron: 25 ug/dL — ABNORMAL LOW (ref 40–155)
TIBC: 209 ug/dL — ABNORMAL LOW (ref 250–450)
UIBC: 184 ug/dL (ref 150–375)

## 2013-09-01 ENCOUNTER — Telehealth: Payer: Self-pay | Admitting: *Deleted

## 2013-09-01 NOTE — Telephone Encounter (Signed)
Patient son called and stated that patient was seen Monday and patient is still complaining about pain in his back and is still coughing and wheezing. Wants to know if you can order a back and chest x-ray?  Please Advise.

## 2013-09-02 NOTE — Telephone Encounter (Signed)
I suppose the xrays could be done--his lungs were clear at the visit.  What part of his back is it--middle or lower back--could get thoracic of lumbar?  He did not complain of anything himself when I saw him.  He said he felt good.

## 2013-09-02 NOTE — Telephone Encounter (Signed)
LMOM to return call.

## 2013-09-05 ENCOUNTER — Other Ambulatory Visit: Payer: Self-pay | Admitting: Nurse Practitioner

## 2013-09-05 NOTE — Telephone Encounter (Signed)
Left message on voicemail for patients son to return call when available   

## 2013-09-07 ENCOUNTER — Other Ambulatory Visit: Payer: Self-pay | Admitting: *Deleted

## 2013-09-07 DIAGNOSIS — R05 Cough: Secondary | ICD-10-CM

## 2013-09-07 DIAGNOSIS — M549 Dorsalgia, unspecified: Secondary | ICD-10-CM

## 2013-09-07 NOTE — Telephone Encounter (Signed)
Patient son Notified and orders placed in system.

## 2013-09-08 ENCOUNTER — Encounter: Payer: Self-pay | Admitting: Internal Medicine

## 2013-09-09 ENCOUNTER — Emergency Department (HOSPITAL_COMMUNITY): Payer: Medicare HMO

## 2013-09-09 ENCOUNTER — Encounter (HOSPITAL_COMMUNITY): Payer: Self-pay | Admitting: Emergency Medicine

## 2013-09-09 ENCOUNTER — Emergency Department (HOSPITAL_COMMUNITY)
Admission: EM | Admit: 2013-09-09 | Discharge: 2013-09-09 | Disposition: A | Discharge status: Home / Self Care | Payer: Medicare HMO | Attending: Emergency Medicine | Admitting: Emergency Medicine

## 2013-09-09 DIAGNOSIS — Z8673 Personal history of transient ischemic attack (TIA), and cerebral infarction without residual deficits: Secondary | ICD-10-CM | POA: Insufficient documentation

## 2013-09-09 DIAGNOSIS — I10 Essential (primary) hypertension: Secondary | ICD-10-CM | POA: Insufficient documentation

## 2013-09-09 DIAGNOSIS — Z872 Personal history of diseases of the skin and subcutaneous tissue: Secondary | ICD-10-CM | POA: Insufficient documentation

## 2013-09-09 DIAGNOSIS — Z792 Long term (current) use of antibiotics: Secondary | ICD-10-CM | POA: Insufficient documentation

## 2013-09-09 DIAGNOSIS — S32019A Unspecified fracture of first lumbar vertebra, initial encounter for closed fracture: Secondary | ICD-10-CM

## 2013-09-09 DIAGNOSIS — Z87440 Personal history of urinary (tract) infections: Secondary | ICD-10-CM | POA: Insufficient documentation

## 2013-09-09 DIAGNOSIS — G20 Parkinson's disease: Secondary | ICD-10-CM | POA: Insufficient documentation

## 2013-09-09 DIAGNOSIS — Z8701 Personal history of pneumonia (recurrent): Secondary | ICD-10-CM | POA: Insufficient documentation

## 2013-09-09 DIAGNOSIS — D649 Anemia, unspecified: Secondary | ICD-10-CM | POA: Insufficient documentation

## 2013-09-09 DIAGNOSIS — F0280 Dementia in other diseases classified elsewhere without behavioral disturbance: Secondary | ICD-10-CM | POA: Insufficient documentation

## 2013-09-09 DIAGNOSIS — Z8619 Personal history of other infectious and parasitic diseases: Secondary | ICD-10-CM | POA: Insufficient documentation

## 2013-09-09 DIAGNOSIS — W1809XA Striking against other object with subsequent fall, initial encounter: Secondary | ICD-10-CM | POA: Insufficient documentation

## 2013-09-09 DIAGNOSIS — Z8719 Personal history of other diseases of the digestive system: Secondary | ICD-10-CM | POA: Insufficient documentation

## 2013-09-09 DIAGNOSIS — E039 Hypothyroidism, unspecified: Secondary | ICD-10-CM | POA: Insufficient documentation

## 2013-09-09 DIAGNOSIS — Y939 Activity, unspecified: Secondary | ICD-10-CM | POA: Insufficient documentation

## 2013-09-09 DIAGNOSIS — Z7982 Long term (current) use of aspirin: Secondary | ICD-10-CM | POA: Insufficient documentation

## 2013-09-09 DIAGNOSIS — Y929 Unspecified place or not applicable: Secondary | ICD-10-CM | POA: Insufficient documentation

## 2013-09-09 DIAGNOSIS — G20A1 Parkinson's disease without dyskinesia, without mention of fluctuations: Secondary | ICD-10-CM | POA: Insufficient documentation

## 2013-09-09 DIAGNOSIS — S32009A Unspecified fracture of unspecified lumbar vertebra, initial encounter for closed fracture: Secondary | ICD-10-CM | POA: Insufficient documentation

## 2013-09-09 DIAGNOSIS — G309 Alzheimer's disease, unspecified: Secondary | ICD-10-CM | POA: Insufficient documentation

## 2013-09-09 DIAGNOSIS — M1990 Unspecified osteoarthritis, unspecified site: Secondary | ICD-10-CM | POA: Insufficient documentation

## 2013-09-09 DIAGNOSIS — M129 Arthropathy, unspecified: Secondary | ICD-10-CM | POA: Insufficient documentation

## 2013-09-09 DIAGNOSIS — Z79899 Other long term (current) drug therapy: Secondary | ICD-10-CM | POA: Insufficient documentation

## 2013-09-09 LAB — COMPREHENSIVE METABOLIC PANEL
AST: 20 U/L (ref 0–37)
Albumin: 3.5 g/dL (ref 3.5–5.2)
Alkaline Phosphatase: 113 U/L (ref 39–117)
BUN: 6 mg/dL (ref 6–23)
Calcium: 9.1 mg/dL (ref 8.4–10.5)
Creatinine, Ser: 0.73 mg/dL (ref 0.50–1.35)
GFR calc Af Amer: 90 mL/min — ABNORMAL LOW (ref >90)
Potassium: 4.1 mEq/L (ref 3.5–5.1)
Total Protein: 7.9 g/dL (ref 6.0–8.3)

## 2013-09-09 LAB — CBC WITH DIFFERENTIAL/PLATELET
Basophils Absolute: 0 10*3/uL (ref 0.0–0.1)
Basophils Relative: 0 % (ref 0–1)
Eosinophils Absolute: 0.1 10*3/uL (ref 0.0–0.7)
Eosinophils Relative: 2 % (ref 0–5)
Hemoglobin: 11.1 g/dL — ABNORMAL LOW (ref 13.0–17.0)
MCH: 28.7 pg (ref 26.0–34.0)
MCHC: 33.3 g/dL (ref 30.0–36.0)
MCV: 86 fL (ref 78.0–100.0)
Monocytes Relative: 10 % (ref 3–12)
Neutrophils Relative %: 61 % (ref 43–77)
Platelets: 220 10*3/uL (ref 150–400)

## 2013-09-09 LAB — URINE MICROSCOPIC-ADD ON

## 2013-09-09 LAB — URINALYSIS, ROUTINE W REFLEX MICROSCOPIC
Glucose, UA: NEGATIVE mg/dL
Nitrite: NEGATIVE
Specific Gravity, Urine: 1.015 (ref 1.005–1.030)
pH: 8 (ref 5.0–8.0)

## 2013-09-09 LAB — POCT I-STAT TROPONIN I: Troponin i, poc: 0.02 ng/mL (ref 0.00–0.08)

## 2013-09-09 MED ORDER — ONDANSETRON 4 MG PO TBDP
8.0000 mg | ORAL_TABLET | Freq: Once | ORAL | Status: AC
  Administered 2013-09-09: 8 mg via ORAL
  Filled 2013-09-09: qty 2

## 2013-09-09 MED ORDER — ACETAMINOPHEN 160 MG/5ML PO SOLN
650.0000 mg | Freq: Once | ORAL | Status: AC
  Administered 2013-09-09: 650 mg via ORAL
  Filled 2013-09-09: qty 20.3

## 2013-09-09 NOTE — ED Notes (Signed)
Spoke with pt.,s daughter, [name] and she spoke with Dr. Manus Gunning.  Dr. Manus Gunning is okay with pt. Being discharged to home.  Fenris Cauble is going to send someone to pick himup.

## 2013-09-09 NOTE — ED Notes (Signed)
Foley catheter intact and draining clear amber urine.

## 2013-09-09 NOTE — ED Provider Notes (Signed)
CSN: [id number]     Arrival date & time 09/09/13  8295 History   First MD Initiated Contact with Patient 09/09/13 [phone]     Chief Complaint  Patient presents with  . Back Pain   (Consider location/radiation/quality/duration/timing/severity/associated sxs/prior Treatment) HPI  This is a 79 year old with multiple medical problems including Alzheimer's dementia, hypertension, history of vasovagal syncope who presents from home with multiple complaints. History is obtained from the patient's son. Per the son, the patient stated this morning that "something is wrong." The son was unable to pinpoint exactly what the patient was talking about but normally the patient is very reluctant to come to the ER and this morning he stated "I need to go to the ER."  The patient did not give a very clear description of what he thought was wrong at that time. He has recently fallen and has been complaining of back pain. They were scheduled to have a chest x-ray and lumbar back film today. Currently the patient denies any pain. He is oriented x2. Patient also has a history of UTIs and has an indwelling Foley catheter. He just finished a course of Keflex on Sunday. The son denies any recent fevers. He does state that the patient has had ongoing upper respiratory congestion without cough. He also describes wheezing.  Past Medical History  Diagnosis Date  . IBS (irritable bowel syndrome)   . Diverticulosis   . Cataract   . OAB (overactive bladder)   . Macular degeneration   . Alzheimer disease   . Hypertension   . Arthritis   . Osteoporosis   . Parkinsonian syndrome   . Syncope, vasovagal   . Neuromuscular disorder     parkisonian syndrome  . 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 Unspecified transient cerebral ischemia   . Unspecified urinary incontinence   . Senile dementia, uncomplicated   . Unspecified constipation   . Hypothyroidism   . Anemia, unspecified   .  Depression   . Alzheimer's disease   . Gout, unspecified   . Thoracic or lumbosacral neuritis or radiculitis, unspecified   . Progressive supranuclear palsy   . Unspecified transient cerebral ischemia   . Unspecified urinary incontinence   . Syncope and collapse   . Paralysis agitans   . Unspecified transient cerebral ischemia   . Pneumonitis due to inhalation of food or vomitus   . Cellulitis and abscess of oral soft tissues   . Screening for lipoid disorders   . Senile dementia, uncomplicated   . Unspecified constipation   . Disorder of bone and cartilage, unspecified   . Other specified acquired hypothyroidism   . Unspecified hypothyroidism   . Anemia, unspecified   . Depressive disorder, not elsewhere classified   . Alzheimer's disease   . Paralysis agitans   . Pneumonia, organism unspecified   . Pseudomonas infection in conditions classified elsewhere and of unspecified site   . Unspecified essential hypertension   . Urinary tract infection, site not specified   . Osteoarthrosis, unspecified whether generalized or localized, unspecified site   . Osteoporosis, unspecified   . Gout, unspecified   . Thoracic or lumbosacral neuritis or radiculitis, unspecified   . Stroke    Past Surgical History  Procedure Laterality Date  . Esophagogastroduodenoscopy    . Eye surgery  2006    LEFT CATARACT   Family History  Problem Relation Age of Onset  . Cancer Mother   . Stroke Father   . Liver  disease Father   . Kidney disease Brother   . Liver disease Brother   . Emphysema Brother    History  Substance Use Topics  . Smoking status: Never Smoker   . Smokeless tobacco: Never Used  . Alcohol Use: No    Review of Systems  Unable to perform ROS: Dementia    Allergies  Review of patient's allergies indicates no known allergies.  Home Medications   Current Outpatient Rx  Name  Route  Sig  Dispense  Refill  . acetaminophen (TYLENOL) 500 MG tablet   Oral   Take 500 mg  by mouth 2 (two) times daily.          Marland Kitchen aspirin EC 325 MG EC tablet   Oral   Take 1 tablet (325 mg total) by mouth daily.   30 tablet   3   . atorvastatin (LIPITOR) 10 MG tablet   Oral   Take 1 tablet (10 mg total) by mouth daily at 6 PM.   90 tablet   1   . bacitracin ointment   Topical   Apply 1 application topically 2 (two) times daily. Applied to tip of penis for skin tear.         . Calcium Carbonate-Vit D-Min (CALTRATE 600+D PLUS) 600-800 MG-UNIT CHEW   Oral   Chew 1 tablet by mouth 2 (two) times daily with a meal.          . CRANBERRY PO   Oral   Take 4,200 mg by mouth 3 (three) times daily.         . D-Mannose POWD   Oral   Take 2,000 mg by mouth 2 (two) times daily.         . ferrous sulfate 325 (65 FE) MG tablet   Oral   Take 325 mg by mouth every other day.          Marland Kitchen guaiFENesin (MUCINEX) 600 MG 12 hr tablet   Oral   Take 1,200 mg by mouth 2 (two) times daily as needed for congestion.          . hydrALAZINE (APRESOLINE) 25 MG tablet   Oral   Take 0.5 tablets (12.5 mg total) by mouth 2 (two) times daily.   45 tablet   1   . lactose free nutrition (BOOST) LIQD   Oral   Take 90 mLs by mouth 2 (two) times daily between meals.          Marland Kitchen levothyroxine (SYNTHROID, LEVOTHROID) 25 MCG tablet   Oral   Take 25 mcg by mouth daily before breakfast.         . MELATONIN EXTRA STRENGTH PO   Oral   Take 15 mLs by mouth at bedtime as needed (sleep).          . memantine (NAMENDA) 10 MG tablet   Oral   Take 10 mg by mouth 2 (two) times daily.         . Multiple Vitamin (MULTIVITAMIN WITH MINERALS) TABS   Oral   Take 1 tablet by mouth daily.         . Multiple Vitamins-Minerals (ICAPS AREDS FORMULA PO)   Oral   Take 2 tablets by mouth 2 (two) times daily.         . polyethylene glycol (MIRALAX / GLYCOLAX) packet   Oral   Take 17 g by mouth daily.         . Probiotic Product (PROBIOTIC PO)  Oral   Take 1 capsule by mouth  daily. Ultra Flora         . saccharomyces boulardii (FLORASTOR) 250 MG capsule   Oral   Take 250 mg by mouth 2 (two) times daily.         . vitamin B-12 (CYANOCOBALAMIN) 1000 MCG tablet   Oral   Take 2,500 mcg by mouth 2 (two) times daily.         . cephALEXin (KEFLEX) 250 MG capsule   Oral   Take 250 mg by mouth 4 (four) times daily.          BP 151/90  Pulse 73  Temp(Src) 98.9 F (37.2 C) (Oral)  Resp 19  Wt 175 lb (79.379 kg)  BMI 25.11 kg/m2  SpO2 94% Physical Exam  Nursing note and vitals reviewed. Constitutional: No distress.  Elderly  HENT:  Head: Normocephalic and atraumatic.  Mouth/Throat: Oropharynx is clear and moist.  Eyes: Pupils are equal, round, and reactive to light.  Neck: Neck supple.  Cardiovascular: Normal rate, regular rhythm and normal heart sounds.   No murmur heard. Pulmonary/Chest: Effort normal and breath sounds normal. No respiratory distress. He has no wheezes.  Abdominal: Soft. Bowel sounds are normal. There is no tenderness. There is no rebound.  Genitourinary:  Foley catheter in place  Musculoskeletal: He exhibits no edema.  No midline tenderness to palpation of the T. or L-spine  Lymphadenopathy:    He has no cervical adenopathy.  Neurological: He is alert.  Oriented x2, follows commands, no obvious cranial nerve deficit, 4+ out of 5 strength in all 4 extremities  Skin: Skin is warm and dry.  Psychiatric: He has a normal mood and affect.    ED Course  Procedures (including critical care time) Labs Review Labs Reviewed  CBC WITH DIFFERENTIAL - Abnormal; Notable for the following:    RBC 3.87 (*)    Hemoglobin 11.1 (*)    HCT 33.3 (*)    All other components within normal limits  COMPREHENSIVE METABOLIC PANEL - Abnormal; Notable for the following:    GFR calc non Af Amer 78 (*)    GFR calc Af Amer 90 (*)    All other components within normal limits  URINALYSIS, ROUTINE W REFLEX MICROSCOPIC - Abnormal; Notable for the  following:    Hgb urine dipstick SMALL (*)    Leukocytes, UA SMALL (*)    All other components within normal limits  URINE MICROSCOPIC-ADD ON  POCT I-STAT TROPONIN I   Imaging Review Dg Chest 2 View  09/09/2013   CLINICAL DATA:  Pain.  EXAM: CHEST  2 VIEW  COMPARISON:  08/17/2013 and 11/16/2011  FINDINGS: The underlying chronic interstitial disease persist. Multiple nodules in the right lung are again noted. No evidence for pulmonary edema. No new focal lung consolidation. No substantial pleural effusion. The cardio pericardial silhouette is enlarged. Bones are diffusely demineralized.  IMPRESSION: Multiple tiny right nodular densities are stable since the most recent comparison, but are new since 11/16/2011.  Cardiomegaly without edema or focal airspace consolidation.   Electronically Signed   By: Kennith Center M.D.   On: 09/09/2013 08:12   Dg Lumbar Spine Complete  09/09/2013   CLINICAL DATA:  Pain post trauma  EXAM: LUMBAR SPINE - COMPLETE 4+ VIEW  COMPARISON:  Report of prior study July 07, 2006 available; actual images from that study not available.  FINDINGS: Frontal, lateral, spot lumbosacral lateral, and bilateral oblique views were obtained. There are 5  non-rib-bearing lumbar type vertebral bodies. There is mild levoscoliosis. There is moderate anterior wedging of the L1 vertebral body, age uncertain. No other fracture. No spondylolisthesis. Disk spaces appear intact. There is facet osteoarthritic change at L3-4, L4-5, and L5-S1 bilaterally.  IMPRESSION: Moderate anterior wedging of the L1 vertebral body, age uncertain. A fracture was not described in this region on prior study. No other fracture. Osteoarthritic change with scoliosis present.   Electronically Signed   By: Bretta Bang M.D.   On: 09/09/2013 08:14    EKG Interpretation   None      EKG independently reviewed by myself: Sinus rhythm with a rate of 68, right bundle branch block present, T wave inversions in the  inferior leads, unchanged from prior, significant artifact, accelerated junctional rhythm on the prior EKG otherwise no significant change  MDM   1. L1 vertebral fracture, closed, initial encounter    This is a 79 year old who presents with his son. Per the son, the patient was complaining of "something wrong this morning."  He has a remote history of fall and was scheduled to have plain films of the spine and chest done today. Basic labwork is otherwise unrevealing. X-rays are notable for a L1 anterior wedge compression fracture of unknown age.  Other than complaints of pain, the patient is at his baseline neurologically. At this time I do not feel he needs further evaluation by neurosurgery spine. He will be referred back to his primary care physician for conservative medical therapy. Tylenol as needed for pain.  The patient son was updated by phone. He states understanding.  After history, exam, and medical workup I feel the patient has been appropriately medically screened and is safe for discharge home. Pertinent diagnoses were discussed with the patient. Patient was given return precautions.     Shon Baton, MD 09/09/13 [phone]

## 2013-09-09 NOTE — ED Notes (Signed)
Ricky [name] [phone]

## 2013-09-09 NOTE — ED Notes (Signed)
Left message on pt.'s sons mobile phone , that pt. Is ready for discharge. Also left my number for him to call if he is delayed

## 2013-09-09 NOTE — ED Notes (Signed)
Left message with son [name].

## 2013-09-09 NOTE — ED Notes (Signed)
Spoke with pt.s family, daughter. She is aware that her brother has not returned to pick up his father.  She stated that she will be contacting pt.s caregiver to come and pick him up. The caregiver's name is Britta Mccreedy.  Pt.s sister will be calling us back.

## 2013-09-09 NOTE — ED Notes (Signed)
Pt fed apple sauce by RN

## 2013-09-09 NOTE — ED Notes (Signed)
Pt denies nausea at this time.

## 2013-09-09 NOTE — ED Notes (Signed)
Fed pt. Lunch. Ate 100% of mash potatoes and ice tea.  Pt. Swallowed without any difficulty.

## 2013-09-09 NOTE — ED Notes (Signed)
CSW spoke with pt, his caregiver and daughter (by phone) re: pt's d/c.  Pt reluctant to go home for an undetermined reason, but denied abuse/neglect when questioned by CSW.  Pt's daughter eventually convinced pt to return home with caregiver and daughter would be there in later this pm.  Pt agreeable to d/c home.

## 2013-09-09 NOTE — ED Notes (Signed)
Pt.s CNA [employer] and pt.s neighbor Fayrene Fearing arrived to take pt.home.   When they were attempting to get him out of bed, pt. Stated, "I don't want to go home."  When asked pt. Why he did not want to go home, he did not answer.  Steward Drone, CNA called pt.s daughter and our Social Worker Jody also spoke with Pt.s daughter.  Pt. 's daughter spoke with Pt. And Jody and decided that pt. Was going home.  Pt.'s daughter is flying in from [place] to check on the pt. We will follow - up with pt.

## 2013-09-09 NOTE — ED Notes (Signed)
Family at bedside. 

## 2013-09-09 NOTE — ED Notes (Addendum)
Pt. arrived with EMS from home , son reported that pt. fell back wards and hit a night stand 2 weeks ago scheduled for x-ray today due to low back pain , son also stated that pt. is " not feeling good this morning " while assisting him to get ready for his x-ray appointment . , pt. is poor historian due to Alzhiemer's dementia.

## 2013-09-13 ENCOUNTER — Telehealth: Payer: Self-pay | Admitting: *Deleted

## 2013-09-13 NOTE — Telephone Encounter (Signed)
Received a call from Alvino Chapel @[facility]  regarding Ricky [name]. She reported that he had trauma wounds that was black on both heels, difficulty to ambulate the patient. She would like to refer patient to the [facility], and to have a eval for PT and Speech therapy . These procedures were okayed by  Dr. Renato Gails.

## 2013-09-14 ENCOUNTER — Other Ambulatory Visit: Payer: Self-pay | Admitting: Internal Medicine

## 2013-09-16 ENCOUNTER — Telehealth: Payer: Self-pay

## 2013-09-16 ENCOUNTER — Other Ambulatory Visit: Payer: Self-pay | Admitting: Internal Medicine

## 2013-09-16 DIAGNOSIS — S32000A Wedge compression fracture of unspecified lumbar vertebra, initial encounter for closed fracture: Secondary | ICD-10-CM

## 2013-09-16 MED ORDER — LIDOCAINE 5 % EX PTCH: 1.0000 | MEDICATED_PATCH | CUTANEOUS | Status: DC

## 2013-09-16 NOTE — Telephone Encounter (Signed)
I recommend lidoderm patch for this pain.  Apply patch each morning and remove 12 hours later.  I have ordered this.

## 2013-09-16 NOTE — Telephone Encounter (Signed)
They may collect another urine sample; however, his behavioral changes may simply be due to progression of his dementia or b/c of his back pain from his compression fracture.  Not all confusion is because of urinary tract infections, and if we keep treating him for colonization (bacteria in the urine without real infection), he will become resistant to all of the antibiotics and there will be nothing left to treat him with when he is seriously infected.

## 2013-09-16 NOTE — Telephone Encounter (Signed)
Patient's son was calling to inquire about urine sample collected in the hospital. Son questioned if that urine sample would be able to be sent for culture, I informed patient's son that the urine was more than likely disposed of. The son then stated patient is still with behavioral issues: Slurring of speech, inability to walk, staring off, hyperactive, unable to hold spoon correctly. The son would like another urine sample collected and sent for culture (Nurse from [facility] could collect) and possibly have patient start vancomycin.   The son would like for Dr.Reed to review the letter that was dropped off this week for it states all the concerns that he has for his father.  Dr.Reed please advise

## 2013-09-16 NOTE — Telephone Encounter (Signed)
Left message on Voicemail informing patients son rx was sent to pharmacy to assist with back pain. Patient's son instructed to call the office is questions or concerns

## 2013-09-16 NOTE — Telephone Encounter (Signed)
Discussed with patient's son, he verbalized understanding of Dr.Reed's response. The son will have [facility] collect urine sample and perform a culture

## 2013-09-16 NOTE — Telephone Encounter (Signed)
Patients son was calling to inform Dr.Reed patient is still with lumbar pain. Patient was told that he has a lumbar compression fracture, patient would like medication to assist with pain. Pharmacy was verified as [facility].   Last OV 08-29-13

## 2013-09-20 ENCOUNTER — Other Ambulatory Visit: Payer: Self-pay

## 2013-09-20 ENCOUNTER — Other Ambulatory Visit: Payer: Self-pay | Admitting: *Deleted

## 2013-09-20 DIAGNOSIS — N390 Urinary tract infection, site not specified: Secondary | ICD-10-CM

## 2013-09-21 LAB — URINALYSIS
Bilirubin, UA: NEGATIVE
Glucose, UA: NEGATIVE
Ketones, UA: NEGATIVE
Nitrite, UA: NEGATIVE
Specific Gravity, UA: 1.013 (ref 1.005–1.030)
Urobilinogen, Ur: 0.2 mg/dL (ref 0.0–1.9)
pH, UA: 7.5 (ref 5.0–7.5)

## 2013-09-22 ENCOUNTER — Encounter (HOSPITAL_BASED_OUTPATIENT_CLINIC_OR_DEPARTMENT_OTHER): Payer: Medicare HMO | Attending: Internal Medicine

## 2013-09-22 DIAGNOSIS — I10 Essential (primary) hypertension: Secondary | ICD-10-CM | POA: Insufficient documentation

## 2013-09-22 DIAGNOSIS — F0280 Dementia in other diseases classified elsewhere without behavioral disturbance: Secondary | ICD-10-CM | POA: Insufficient documentation

## 2013-09-22 DIAGNOSIS — Z8673 Personal history of transient ischemic attack (TIA), and cerebral infarction without residual deficits: Secondary | ICD-10-CM | POA: Insufficient documentation

## 2013-09-22 DIAGNOSIS — E039 Hypothyroidism, unspecified: Secondary | ICD-10-CM | POA: Insufficient documentation

## 2013-09-22 DIAGNOSIS — M069 Rheumatoid arthritis, unspecified: Secondary | ICD-10-CM | POA: Insufficient documentation

## 2013-09-22 DIAGNOSIS — G238 Other specified degenerative diseases of basal ganglia: Secondary | ICD-10-CM | POA: Insufficient documentation

## 2013-09-22 DIAGNOSIS — L97409 Non-pressure chronic ulcer of unspecified heel and midfoot with unspecified severity: Secondary | ICD-10-CM | POA: Insufficient documentation

## 2013-09-23 LAB — URINE CULTURE

## 2013-09-23 NOTE — Progress Notes (Signed)
[facility]  NAME:  Ricky [name], Ricky [name] NO.:  [id number]  MEDICAL RECORD NO.:  [id number]      DATE OF BIRTH:  20-Jun-1917  PHYSICIAN:  Maxwell Caul, M.D. VISIT DATE:  09/22/2013                                  OFFICE VISIT   Mr. Mcglone is a gentleman I actually know from primary care some years ago.  He is a man with progressive supranuclear palsy.  He had previously been seen at [facility] wound care center in March 2014, for a stage II pressure ulcer on his left heel.  This ultimately healed.  He has been noted by his son, who is his caregiver to have some blackened areas on both heels, and he brought him in today for our review.  PAST MEDICAL HISTORY:  Includes hypertension, progressive supranuclear palsy with parkinsonism and dementia, osteoarthritis, rheumatoid arthritis, allergic rhinitis, hypothyroidism, history of CVA, history of syncopal episodes, and a chronic Foley catheter.  PAST SURGICAL HISTORY:  Cataract surgery bilaterally.  MEDICATION LIST:  Reviewed.  PHYSICAL EXAMINATION:  Temperature is 98.4, pulse 66, respirations 16, blood pressure 130/85.  His weight is 175 pounds.  He did indeed have 2 areas on both heels, both with what looked to be adherent eschar.  The area on the right heel was removed with a scalpel and pickups. Fortunately underneath this was nothing, but reasonably healthy looking epithelialization.  The area on the left has a tight dry eschar, although it is only the size of a quarter.  His ABIs calculated in this clinic were 1.2 on the right, and unable to do on the left.  IMPRESSIONS:  Pressure ulceration on the left heel.  We applied Hydrogel and Santyl with an alginate and foam cover.  This can be left in place for a week.  To the right heel which really did not seem to have any particular issue after removing the eschar, we just applied silver alginate and heel cup.  We will see him again in a  week's time.  He may have a component of PAD on the left.  I will review this again next week.          ______________________________ Maxwell Caul, M.D.     MGR/MEDQ  D:  09/22/2013  T:  09/23/2013  Job:  [id number]

## 2013-09-30 ENCOUNTER — Telehealth: Payer: Self-pay | Admitting: *Deleted

## 2013-09-30 ENCOUNTER — Other Ambulatory Visit: Payer: Self-pay | Admitting: *Deleted

## 2013-09-30 MED ORDER — AMPICILLIN 500 MG PO CAPS: ORAL_CAPSULE | ORAL | Status: DC

## 2013-09-30 NOTE — Telephone Encounter (Signed)
Received Urine culture from [facility] labs. Per Wyatt Portela start Ampicillin 500mg  tid times 10 days. Faxed to [facility]. Called and [facility] for patient.

## 2013-10-04 ENCOUNTER — Other Ambulatory Visit: Payer: Self-pay | Admitting: Internal Medicine

## 2013-10-06 DIAGNOSIS — R488 Other symbolic dysfunctions: Secondary | ICD-10-CM

## 2013-10-06 DIAGNOSIS — Z466 Encounter for fitting and adjustment of urinary device: Secondary | ICD-10-CM

## 2013-10-06 DIAGNOSIS — G238 Other specified degenerative diseases of basal ganglia: Secondary | ICD-10-CM

## 2013-10-06 DIAGNOSIS — R1312 Dysphagia, oropharyngeal phase: Secondary | ICD-10-CM

## 2013-10-18 ENCOUNTER — Encounter (HOSPITAL_COMMUNITY): Payer: Self-pay | Admitting: Emergency Medicine

## 2013-10-18 ENCOUNTER — Emergency Department (HOSPITAL_COMMUNITY): Payer: Medicare HMO

## 2013-10-18 ENCOUNTER — Observation Stay (HOSPITAL_COMMUNITY)
Admission: EM | Admit: 2013-10-18 | Discharge: 2013-10-19 | Disposition: A | Discharge status: Home / Self Care | Payer: Medicare HMO | Attending: Internal Medicine | Admitting: Internal Medicine

## 2013-10-18 DIAGNOSIS — R0789 Other chest pain: Secondary | ICD-10-CM | POA: Diagnosis present

## 2013-10-18 DIAGNOSIS — Z87440 Personal history of urinary (tract) infections: Secondary | ICD-10-CM | POA: Insufficient documentation

## 2013-10-18 DIAGNOSIS — M109 Gout, unspecified: Secondary | ICD-10-CM

## 2013-10-18 DIAGNOSIS — IMO0002 Reserved for concepts with insufficient information to code with codable children: Secondary | ICD-10-CM | POA: Insufficient documentation

## 2013-10-18 DIAGNOSIS — Z7982 Long term (current) use of aspirin: Secondary | ICD-10-CM | POA: Insufficient documentation

## 2013-10-18 DIAGNOSIS — N390 Urinary tract infection, site not specified: Secondary | ICD-10-CM

## 2013-10-18 DIAGNOSIS — G20 Parkinson's disease: Secondary | ICD-10-CM | POA: Insufficient documentation

## 2013-10-18 DIAGNOSIS — F0280 Dementia in other diseases classified elsewhere without behavioral disturbance: Secondary | ICD-10-CM | POA: Diagnosis present

## 2013-10-18 DIAGNOSIS — K623 Rectal prolapse: Secondary | ICD-10-CM

## 2013-10-18 DIAGNOSIS — N400 Enlarged prostate without lower urinary tract symptoms: Secondary | ICD-10-CM

## 2013-10-18 DIAGNOSIS — Z87448 Personal history of other diseases of urinary system: Secondary | ICD-10-CM | POA: Insufficient documentation

## 2013-10-18 DIAGNOSIS — G309 Alzheimer's disease, unspecified: Secondary | ICD-10-CM | POA: Insufficient documentation

## 2013-10-18 DIAGNOSIS — M899 Disorder of bone, unspecified: Secondary | ICD-10-CM | POA: Insufficient documentation

## 2013-10-18 DIAGNOSIS — I639 Cerebral infarction, unspecified: Secondary | ICD-10-CM

## 2013-10-18 DIAGNOSIS — Z872 Personal history of diseases of the skin and subcutaneous tissue: Secondary | ICD-10-CM | POA: Insufficient documentation

## 2013-10-18 DIAGNOSIS — F3289 Other specified depressive episodes: Secondary | ICD-10-CM | POA: Insufficient documentation

## 2013-10-18 DIAGNOSIS — J189 Pneumonia, unspecified organism: Secondary | ICD-10-CM

## 2013-10-18 DIAGNOSIS — R5381 Other malaise: Secondary | ICD-10-CM | POA: Insufficient documentation

## 2013-10-18 DIAGNOSIS — Z79899 Other long term (current) drug therapy: Secondary | ICD-10-CM | POA: Insufficient documentation

## 2013-10-18 DIAGNOSIS — I498 Other specified cardiac arrhythmias: Secondary | ICD-10-CM | POA: Insufficient documentation

## 2013-10-18 DIAGNOSIS — M1990 Unspecified osteoarthritis, unspecified site: Secondary | ICD-10-CM | POA: Insufficient documentation

## 2013-10-18 DIAGNOSIS — R627 Adult failure to thrive: Secondary | ICD-10-CM

## 2013-10-18 DIAGNOSIS — R079 Chest pain, unspecified: Secondary | ICD-10-CM | POA: Diagnosis present

## 2013-10-18 DIAGNOSIS — F329 Major depressive disorder, single episode, unspecified: Secondary | ICD-10-CM | POA: Insufficient documentation

## 2013-10-18 DIAGNOSIS — G20A1 Parkinson's disease without dyskinesia, without mention of fluctuations: Secondary | ICD-10-CM | POA: Insufficient documentation

## 2013-10-18 DIAGNOSIS — Z87891 Personal history of nicotine dependence: Secondary | ICD-10-CM | POA: Insufficient documentation

## 2013-10-18 DIAGNOSIS — F0390 Unspecified dementia without behavioral disturbance: Secondary | ICD-10-CM

## 2013-10-18 DIAGNOSIS — G709 Myoneural disorder, unspecified: Secondary | ICD-10-CM | POA: Insufficient documentation

## 2013-10-18 DIAGNOSIS — I10 Essential (primary) hypertension: Secondary | ICD-10-CM | POA: Insufficient documentation

## 2013-10-18 DIAGNOSIS — R55 Syncope and collapse: Principal | ICD-10-CM | POA: Insufficient documentation

## 2013-10-18 DIAGNOSIS — K3184 Gastroparesis: Secondary | ICD-10-CM

## 2013-10-18 DIAGNOSIS — R509 Fever, unspecified: Secondary | ICD-10-CM

## 2013-10-18 DIAGNOSIS — Z8669 Personal history of other diseases of the nervous system and sense organs: Secondary | ICD-10-CM

## 2013-10-18 DIAGNOSIS — Z86718 Personal history of other venous thrombosis and embolism: Secondary | ICD-10-CM

## 2013-10-18 DIAGNOSIS — R1310 Dysphagia, unspecified: Secondary | ICD-10-CM

## 2013-10-18 DIAGNOSIS — Z8701 Personal history of pneumonia (recurrent): Secondary | ICD-10-CM | POA: Insufficient documentation

## 2013-10-18 DIAGNOSIS — R531 Weakness: Secondary | ICD-10-CM

## 2013-10-18 DIAGNOSIS — Z8673 Personal history of transient ischemic attack (TIA), and cerebral infarction without residual deficits: Secondary | ICD-10-CM | POA: Insufficient documentation

## 2013-10-18 DIAGNOSIS — E039 Hypothyroidism, unspecified: Secondary | ICD-10-CM | POA: Insufficient documentation

## 2013-10-18 DIAGNOSIS — M810 Age-related osteoporosis without current pathological fracture: Secondary | ICD-10-CM | POA: Insufficient documentation

## 2013-10-18 DIAGNOSIS — G92 Toxic encephalopathy: Secondary | ICD-10-CM

## 2013-10-18 DIAGNOSIS — D649 Anemia, unspecified: Secondary | ICD-10-CM | POA: Insufficient documentation

## 2013-10-18 DIAGNOSIS — K089 Disorder of teeth and supporting structures, unspecified: Secondary | ICD-10-CM | POA: Insufficient documentation

## 2013-10-18 DIAGNOSIS — G988 Other disorders of nervous system: Secondary | ICD-10-CM

## 2013-10-18 DIAGNOSIS — R259 Unspecified abnormal involuntary movements: Secondary | ICD-10-CM | POA: Insufficient documentation

## 2013-10-18 LAB — COMPREHENSIVE METABOLIC PANEL
ALT: 15 U/L (ref 0–53)
AST: 24 U/L (ref 0–37)
Albumin: 3.7 g/dL (ref 3.5–5.2)
Alkaline Phosphatase: 108 U/L (ref 39–117)
BUN: 7 mg/dL (ref 6–23)
Chloride: 104 mEq/L (ref 96–112)
GFR calc non Af Amer: 76 mL/min — ABNORMAL LOW (ref >90)
Potassium: 4.7 mEq/L (ref 3.5–5.1)
Sodium: 142 mEq/L (ref 135–145)
Total Bilirubin: 0.5 mg/dL (ref 0.3–1.2)
Total Protein: 7.8 g/dL (ref 6.0–8.3)

## 2013-10-18 LAB — CBC
HCT: 35.2 % — ABNORMAL LOW (ref 39.0–52.0)
Hemoglobin: 11.6 g/dL — ABNORMAL LOW (ref 13.0–17.0)
MCH: 28.9 pg (ref 26.0–34.0)
MCHC: 33 g/dL (ref 30.0–36.0)
MCV: 87.8 fL (ref 78.0–100.0)
RBC: 4.01 MIL/uL — ABNORMAL LOW (ref 4.22–5.81)

## 2013-10-18 LAB — CBC WITH DIFFERENTIAL/PLATELET
Basophils Absolute: 0 10*3/uL (ref 0.0–0.1)
Eosinophils Absolute: 0.1 10*3/uL (ref 0.0–0.7)
HCT: 36.1 % — ABNORMAL LOW (ref 39.0–52.0)
Hemoglobin: 11.6 g/dL — ABNORMAL LOW (ref 13.0–17.0)
Lymphocytes Relative: 14 % (ref 12–46)
MCH: 28.5 pg (ref 26.0–34.0)
MCHC: 32.1 g/dL (ref 30.0–36.0)
MCV: 88.7 fL (ref 78.0–100.0)
Monocytes Relative: 7 % (ref 3–12)
Neutro Abs: 5.1 10*3/uL (ref 1.7–7.7)
Neutrophils Relative %: 78 % — ABNORMAL HIGH (ref 43–77)
RDW: 14.5 % (ref 11.5–15.5)
WBC: 6.6 10*3/uL (ref 4.0–10.5)

## 2013-10-18 LAB — CREATININE, SERUM
Creatinine, Ser: 0.72 mg/dL (ref 0.50–1.35)
GFR calc Af Amer: 90 mL/min — ABNORMAL LOW (ref >90)
GFR calc non Af Amer: 77 mL/min — ABNORMAL LOW (ref >90)

## 2013-10-18 LAB — URINALYSIS, ROUTINE W REFLEX MICROSCOPIC
Bilirubin Urine: NEGATIVE
Glucose, UA: NEGATIVE mg/dL
Specific Gravity, Urine: 1.008 (ref 1.005–1.030)
Urobilinogen, UA: 0.2 mg/dL (ref 0.0–1.0)
pH: 7.5 (ref 5.0–8.0)

## 2013-10-18 LAB — LACTIC ACID, PLASMA: Lactic Acid, Venous: 1.3 mmol/L (ref 0.5–2.2)

## 2013-10-18 LAB — TROPONIN I: Troponin I: <0.3 ng/mL (ref <0.30)

## 2013-10-18 MED ORDER — ATORVASTATIN CALCIUM 10 MG PO TABS
10.0000 mg | ORAL_TABLET | Freq: Every day | ORAL | Status: DC
  Administered 2013-10-18: 10 mg via ORAL
  Filled 2013-10-18 (×2): qty 1

## 2013-10-18 MED ORDER — LEVOTHYROXINE SODIUM 25 MCG PO TABS
25.0000 ug | ORAL_TABLET | Freq: Every day | ORAL | Status: DC
  Administered 2013-10-19: 25 ug via ORAL
  Filled 2013-10-18 (×2): qty 1

## 2013-10-18 MED ORDER — ENOXAPARIN SODIUM 40 MG/0.4ML ~~LOC~~ SOLN
40.0000 mg | SUBCUTANEOUS | Status: DC
  Administered 2013-10-18: 40 mg via SUBCUTANEOUS
  Filled 2013-10-18 (×2): qty 0.4

## 2013-10-18 MED ORDER — MEMANTINE HCL 10 MG PO TABS
10.0000 mg | ORAL_TABLET | Freq: Two times a day (BID) | ORAL | Status: DC
  Administered 2013-10-18 – 2013-10-19 (×2): 10 mg via ORAL
  Filled 2013-10-18 (×3): qty 1

## 2013-10-18 MED ORDER — ONDANSETRON HCL 4 MG/2ML IJ SOLN: 4.0000 mg | Freq: Four times a day (QID) | INTRAMUSCULAR | Status: DC | PRN

## 2013-10-18 MED ORDER — GI COCKTAIL ~~LOC~~: 30.0000 mL | Freq: Four times a day (QID) | ORAL | Status: DC | PRN

## 2013-10-18 MED ORDER — RESOURCE THICKENUP CLEAR PO POWD
ORAL | Status: DC | PRN
  Filled 2013-10-18: qty 125

## 2013-10-18 MED ORDER — DEXTROSE 5 % IV SOLN
1.0000 g | Freq: Once | INTRAVENOUS | Status: AC
  Administered 2013-10-18: 1 g via INTRAVENOUS
  Filled 2013-10-18: qty 10

## 2013-10-18 MED ORDER — ACETAMINOPHEN 325 MG PO TABS: 650.0000 mg | ORAL_TABLET | ORAL | Status: DC | PRN

## 2013-10-18 MED ORDER — HYDRALAZINE HCL 25 MG PO TABS
12.5000 mg | ORAL_TABLET | Freq: Two times a day (BID) | ORAL | Status: DC
  Administered 2013-10-18 – 2013-10-19 (×2): 12.5 mg via ORAL
  Filled 2013-10-18 (×3): qty 0.5

## 2013-10-18 MED ORDER — ASPIRIN EC 325 MG PO TBEC
325.0000 mg | DELAYED_RELEASE_TABLET | Freq: Every day | ORAL | Status: DC
  Administered 2013-10-18 – 2013-10-19 (×2): 325 mg via ORAL
  Filled 2013-10-18 (×2): qty 1

## 2013-10-18 MED ORDER — MORPHINE SULFATE 2 MG/ML IJ SOLN: 2.0000 mg | INTRAMUSCULAR | Status: DC | PRN

## 2013-10-18 NOTE — H&P (Signed)
[facility] History and Physical  Ricky [name]:[id number] DOB: [birth year] DOA: 10/18/2013  Referring physician: Dr. Jodi Mourning PCP: Bufford Spikes, DO  Specialists: none  Chief Complaint: Chest pain  HPI: Ricky [name] is a 79 y.o. male  79 y.o. male with Pmhx hypertension, advanced dementia who presents with fluctuating coordination & mental status for past several weeks, now this morning complaining chest pain nothing makes it better or worse. Most of the history was obtained from the chart as the patient cannot give a reliable history when you ask him as his chest pain he points to his chest. But he cannot describe the. And this always his family is not in the room have tried calling several times with no answer.  In the ED: A basic metabolic panel was done that was within normal limits a CBC shows a mild anemia with hemoglobin of 11.6 and an MCV of 88, lactic acid of 1.3 troponins first set negative, EKG shows sinus rhythm left bundle branch block and possible left ventricular hypertrophy    Review of Systems: The patient denies anorexia, fever, weight loss,, vision loss, decreased hearing, hoarseness,  peripheral edema, balance deficits, hemoptysis, abdominal pain, melena, hematochezia, severe indigestion/heartburn, hematuria, incontinence, genital sores, muscle weakness, suspicious skin lesions, transient blindness, difficulty walking, depression, unusual weight change, abnormal bleeding, enlarged lymph nodes, angioedema, and breast masses.    Past Medical History  Diagnosis Date  . IBS (irritable bowel syndrome)   . Diverticulosis   . Cataract   . OAB (overactive bladder)   . Macular degeneration   . Alzheimer disease   . Hypertension   . Arthritis   . Osteoporosis   . Parkinsonian syndrome   . Syncope, vasovagal   . Neuromuscular disorder     parkisonian syndrome  . 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 Unspecified transient  cerebral ischemia   . Unspecified urinary incontinence   . Senile dementia, uncomplicated   . Unspecified constipation   . Hypothyroidism   . Anemia, unspecified   . Depression   . Alzheimer's disease   . Gout, unspecified   . Thoracic or lumbosacral neuritis or radiculitis, unspecified   . Progressive supranuclear palsy   . Unspecified transient cerebral ischemia   . Unspecified urinary incontinence   . Syncope and collapse   . Paralysis agitans   . Unspecified transient cerebral ischemia   . Pneumonitis due to inhalation of food or vomitus   . Cellulitis and abscess of oral soft tissues   . Screening for lipoid disorders   . Senile dementia, uncomplicated   . Unspecified constipation   . Disorder of bone and cartilage, unspecified   . Other specified acquired hypothyroidism   . Unspecified hypothyroidism   . Anemia, unspecified   . Depressive disorder, not elsewhere classified   . Alzheimer's disease   . Paralysis agitans   . Pneumonia, organism unspecified   . Pseudomonas infection in conditions classified elsewhere and of unspecified site   . Unspecified essential hypertension   . Urinary tract infection, site not specified   . Osteoarthrosis, unspecified whether generalized or localized, unspecified site   . Osteoporosis, unspecified   . Gout, unspecified   . Thoracic or lumbosacral neuritis or radiculitis, unspecified   . Stroke    Past Surgical History  Procedure Laterality Date  . Esophagogastroduodenoscopy    . Eye surgery  2006    LEFT CATARACT   Social History:  reports that he has never smoked.  He has never used smokeless tobacco. He reports that he does not drink alcohol or use illicit drugs. Lives at home with family  No Known Allergies  Family History  Problem Relation Age of Onset  . Cancer Mother   . Stroke Father   . Liver disease Father   . Kidney disease Brother   . Liver disease Brother   . Emphysema Brother     Prior to Admission  medications   Medication Sig Start Date End Date Taking? Authorizing Provider  acetaminophen (TYLENOL) 500 MG tablet Take 500 mg by mouth 3 (three) times daily.    Yes Historical Provider, MD  aspirin EC 325 MG EC tablet Take 1 tablet (325 mg total) by mouth daily. 02/03/13  Yes Ripudeep Jenna Luo, MD  atorvastatin (LIPITOR) 10 MG tablet Take 1 tablet (10 mg total) by mouth daily at 6 PM. 08/29/13 08/29/14 Yes Tiffany L Reed, DO  bacitracin ointment Apply 1 application topically 2 (two) times daily. Applied to tip of penis for skin tear.   Yes Historical Provider, MD  Calcium Carbonate-Vit D-Min (CALTRATE 600+D PLUS) 600-800 MG-UNIT CHEW Chew 1 tablet by mouth 2 (two) times daily with a meal.    Yes Historical Provider, MD  CRANBERRY PO Take 4,200 mg by mouth 3 (three) times daily.   Yes Historical Provider, MD  Cyanocobalamin (VITAMIN B-12) 2500 MCG SUBL Place 2,500 mcg under the tongue 2 (two) times daily.   Yes Historical Provider, MD  D-Mannose POWD Take 2,000 mg by mouth 2 (two) times daily.   Yes Historical Provider, MD  ferrous sulfate 325 (65 FE) MG tablet Take 325 mg by mouth every other day.    Yes Historical Provider, MD  guaiFENesin (MUCINEX) 600 MG 12 hr tablet Take 1,200 mg by mouth 2 (two) times daily as needed for congestion.    Yes Historical Provider, MD  hydrALAZINE (APRESOLINE) 25 MG tablet Take 0.5 tablets (12.5 mg total) by mouth 2 (two) times daily. 08/29/13  Yes Tiffany L Reed, DO  lactose free nutrition (BOOST) LIQD Take 90 mLs by mouth 2 (two) times daily between meals.    Yes Historical Provider, MD  levothyroxine (SYNTHROID, LEVOTHROID) 25 MCG tablet Take 25 mcg by mouth daily before breakfast.   Yes Historical Provider, MD  lidocaine (LIDODERM) 5 % Place 1 patch onto the skin daily as needed (for back pain). Remove & Discard patch within 12 hours or as directed by MD 09/16/13  Yes Tiffany L Reed, DO  MELATONIN EXTRA STRENGTH PO Take 15 mLs by mouth at bedtime as needed  (sleep).    Yes Historical Provider, MD  memantine (NAMENDA) 10 MG tablet Take 10 mg by mouth 2 (two) times daily.   Yes Historical Provider, MD  Multiple Vitamin (MULTIVITAMIN WITH MINERALS) TABS Take 1 tablet by mouth daily.   Yes Historical Provider, MD  Multiple Vitamins-Minerals (ICAPS AREDS FORMULA PO) Take 2 tablets by mouth 2 (two) times daily.   Yes Historical Provider, MD  polyethylene glycol (MIRALAX / GLYCOLAX) packet Take 17 g by mouth daily.   Yes Historical Provider, MD  Probiotic Product (PROBIOTIC PO) Take 1 capsule by mouth daily. Ultra Flora   Yes Historical Provider, MD   Physical Exam: Filed Vitals:   10/18/13 1341  BP: 165/79  Pulse:   Temp: 98.6 F (37 C)  Resp: 15   BP 165/79  Pulse 49  Temp(Src) 98.6 F (37 C) (Oral)  Resp 15  SpO2 99%  General Appearance:  no distress, appears stated age              Throat:   Lips, mucosa, and tongue normal; teeth and gums normal        Lungs:     Clear to auscultation bilaterally, respirations unlabored     Heart:    Regular rate and rhythm, S1 and S2 normal, no murmur, rub   or gallop  Abdomen:     Soft, non-tender, bowel sounds active all four quadrants,    no masses, no organomegaly        Extremities:   Extremities normal, atraumatic, no cyanosis or edema           Neurologic:   CNII-XII intact. Moving all 4 extremities without any difficulties.       Labs on Admission:  Basic Metabolic Panel:  Recent Labs Lab 10/18/13 1036  NA 142  K 4.7  CL 104  CO2 25  GLUCOSE 87  BUN 7  CREATININE 0.75  CALCIUM 9.8   Liver Function Tests:  Recent Labs Lab 10/18/13 1036  AST 24  ALT 15  ALKPHOS 108  BILITOT 0.5  PROT 7.8  ALBUMIN 3.7   No results found for this basename: LIPASE, AMYLASE,  in the last 168 hours No results found for this basename: AMMONIA,  in the last 168 hours CBC:  Recent Labs Lab 10/18/13 1036  WBC 6.6  NEUTROABS 5.1  HGB 11.6*  HCT 36.1*  MCV 88.7  PLT PLATELET  CLUMPS NOTED ON SMEAR, COUNT APPEARS ADEQUATE   Cardiac Enzymes:  Recent Labs Lab 10/18/13 1014  TROPONINI <0.30    BNP (last 3 results) No results found for this basename: PROBNP,  in the last 8760 hours CBG: No results found for this basename: GLUCAP,  in the last 168 hours  Radiological Exams on Admission: Dg Chest 2 View  10/18/2013   CLINICAL DATA:  Loss of consciousness.  EXAM: CHEST  2 VIEW  COMPARISON:  Chest radiograph 09/09/2013. Chest radiograph 02/11/2013  FINDINGS: Patient is mildly rotated. Stable enlarged cardiac contours with tortuosity and calcification of the thoracic aorta. Minimal bibasilar atelectasis and or scarring. No significant interval change in scattered small nodular densities. No definite pleural effusion or pneumothorax. Regional skeleton is unremarkable.  IMPRESSION: No significant interval change multiple tiny densities when compared to 02/11/2013.  Cardiomegaly.  No focal airspace consolidation.   Electronically Signed   By: Annia Belt M.D.   On: 10/18/2013 10:32   Ct Head Wo Contrast  10/18/2013   CLINICAL DATA:  Syncope, history of Alzheimer's dementia  EXAM: CT HEAD WITHOUT CONTRAST  TECHNIQUE: Contiguous axial images were obtained from the base of the skull through the vertex without intravenous contrast.  COMPARISON:  06/08/2013  FINDINGS: No skull fracture is identified. No intracranial hemorrhage, mass effect or midline shift. No acute cortical infarction. No mass lesion is noted on this unenhanced scan. Stable atrophy and chronic white matter disease. Metallic dental artifacts are noted. Mucosal thickening with partial opacification bilateral sphenoid sinus again noted.  IMPRESSION: No acute intracranial abnormality. Stable atrophy and chronic white matter disease.   Electronically Signed   By: Natasha Mead M.D.   On: 10/18/2013 13:49    EKG: Independently reviewed. Sinus rhythm with left bundle branch block and possible left ventricular hypertrophy  nonspecific T wave changes.  Assessment/Plan Atypical Chest pain - Admit to telemetry. - Cycle cardiac enzymes. - Continue aspirin and statins.  ALZHEIMERS DISEASE: - Continue home meds.  Other medical problems are stable continue medications.   Code Status: Full  Family Communication: Pt at bedside  Disposition Plan: Admit for further evaluation, telemetry bed requested    Time spent: 60 minutes  Marinda Elk [facility] Pager [phone]  If 7PM-7AM, please contact night-coverage [email or web address] Password [email or web address] 10/18/2013, 3:12 PM

## 2013-10-18 NOTE — ED Provider Notes (Signed)
CSN: [id number]     Arrival date & time 10/18/13  4098 History   First MD Initiated Contact with Patient 10/18/13 [phone]     Chief Complaint  Patient presents with  . Loss of Consciousness   (Consider location/radiation/quality/duration/timing/severity/associated sxs/prior Treatment) HPI Comments: Pt is a 79 y.o. male with Pmhx as above who presents with fluctuating coordination & mental status for past several weeks, now this morning complaining that his "heart" hurt, and later had a syncope episode.  Pt unable to describe pre-syncope symptoms.  Currently complaints only of dental pain at site of loose bridge that was repaired yesterday. He has had syncopal episodes in the morning, related to position change, constiaption, for several years. Dioes not complain of chest pain typically.   Patient is a 79 y.o. male presenting with syncope. The history is provided by the patient. No language interpreter was used.  Loss of Consciousness Episode history:  Single Most recent episode:  Today Timing:  Sporadic Progression:  Resolved Chronicity:  Recurrent Context: standing up   Witnessed: yes   Relieved by:  Lying down Worsened by:  Nothing tried Ineffective treatments:  None tried Associated symptoms: chest pain and weakness   Associated symptoms: no confusion, no dizziness, no fever, no focal sensory loss, no headaches, no nausea, no shortness of breath and no vomiting   Chest pain:    Quality:  Unable to specify   Severity:  Unable to specify   Onset quality:  Unable to specify   Timing:  Unable to specify   Chronicity:  New   Past Medical History  Diagnosis Date  . IBS (irritable bowel syndrome)   . Diverticulosis   . Cataract   . OAB (overactive bladder)   . Macular degeneration   . Alzheimer disease   . Hypertension   . Arthritis   . Osteoporosis   . Parkinsonian syndrome   . Syncope, vasovagal   . Neuromuscular disorder     parkisonian syndrome  . Dementia due to  Parkinson's disease without behavioral disturbance   . Unspecified transient cerebral ischemia   . Unspecified urinary incontinence   . Senile dementia, uncomplicated   . Unspecified constipation   . Hypothyroidism   . Anemia, unspecified   . Depression   . Alzheimer's disease   . Gout, unspecified   . Thoracic or lumbosacral neuritis or radiculitis, unspecified   . Progressive supranuclear palsy   . Unspecified transient cerebral ischemia   . Unspecified urinary incontinence   . Syncope and collapse   . Paralysis agitans   . Unspecified transient cerebral ischemia   . Pneumonitis due to inhalation of food or vomitus   . Cellulitis and abscess of oral soft tissues   . Screening for lipoid disorders   . Senile dementia, uncomplicated   . Unspecified constipation   . Disorder of bone and cartilage, unspecified   . Other specified acquired hypothyroidism   . Unspecified hypothyroidism   . Anemia, unspecified   . Depressive disorder, not elsewhere classified   . Alzheimer's disease   . Paralysis agitans   . Pneumonia, organism unspecified   . Pseudomonas infection in conditions classified elsewhere and of unspecified site   . Unspecified essential hypertension   . Urinary tract infection, site not specified   . Osteoarthrosis, unspecified whether generalized or localized, unspecified site   . Osteoporosis, unspecified   . Gout, unspecified   . Thoracic or lumbosacral neuritis or radiculitis, unspecified   . Stroke    Past  Surgical History  Procedure Laterality Date  . Esophagogastroduodenoscopy    . Eye surgery  2006    LEFT CATARACT   Family History  Problem Relation Age of Onset  . Cancer Mother   . Stroke Father   . Liver disease Father   . Kidney disease Brother   . Liver disease Brother   . Emphysema Brother    History  Substance Use Topics  . Smoking status: Never Smoker   . Smokeless tobacco: Never Used  . Alcohol Use: No    Review of Systems   Constitutional: Negative for fever, activity change, appetite change and fatigue.  HENT: Negative for congestion, facial swelling, rhinorrhea and trouble swallowing.   Eyes: Negative for photophobia and pain.  Respiratory: Negative for cough, chest tightness and shortness of breath.   Cardiovascular: Positive for chest pain and syncope. Negative for leg swelling.  Gastrointestinal: Negative for nausea, vomiting, abdominal pain, diarrhea and constipation.  Endocrine: Negative for polydipsia and polyuria.  Genitourinary: Negative for dysuria, urgency, decreased urine volume and difficulty urinating.  Musculoskeletal: Negative for back pain and gait problem.  Skin: Negative for color change, rash and wound.  Allergic/Immunologic: Negative for immunocompromised state.  Neurological: Positive for weakness. Negative for dizziness, facial asymmetry, speech difficulty, numbness and headaches.  Psychiatric/Behavioral: Negative for confusion, decreased concentration and agitation.    Allergies  Review of patient's allergies indicates no known allergies.  Home Medications   Current Outpatient Rx  Name  Route  Sig  Dispense  Refill  . acetaminophen (TYLENOL) 500 MG tablet   Oral   Take 500 mg by mouth 3 (three) times daily.          Marland Kitchen aspirin EC 325 MG EC tablet   Oral   Take 1 tablet (325 mg total) by mouth daily.   30 tablet   3   . atorvastatin (LIPITOR) 10 MG tablet   Oral   Take 1 tablet (10 mg total) by mouth daily at 6 PM.   90 tablet   1   . bacitracin ointment   Topical   Apply 1 application topically 2 (two) times daily. Applied to tip of penis for skin tear.         . Calcium Carbonate-Vit D-Min (CALTRATE 600+D PLUS) 600-800 MG-UNIT CHEW   Oral   Chew 1 tablet by mouth 2 (two) times daily with a meal.          . CRANBERRY PO   Oral   Take 4,200 mg by mouth 3 (three) times daily.         . Cyanocobalamin (VITAMIN B-12) 2500 MCG SUBL   Sublingual   Place  2,500 mcg under the tongue 2 (two) times daily.         . D-Mannose POWD   Oral   Take 2,000 mg by mouth 2 (two) times daily.         . ferrous sulfate 325 (65 FE) MG tablet   Oral   Take 325 mg by mouth every other day.          Marland Kitchen guaiFENesin (MUCINEX) 600 MG 12 hr tablet   Oral   Take 1,200 mg by mouth 2 (two) times daily as needed for congestion.          . hydrALAZINE (APRESOLINE) 25 MG tablet   Oral   Take 0.5 tablets (12.5 mg total) by mouth 2 (two) times daily.   45 tablet   1   . lactose  free nutrition (BOOST) LIQD   Oral   Take 90 mLs by mouth 2 (two) times daily between meals.          Marland Kitchen levothyroxine (SYNTHROID, LEVOTHROID) 25 MCG tablet   Oral   Take 25 mcg by mouth daily before breakfast.         . lidocaine (LIDODERM) 5 %   Transdermal   Place 1 patch onto the skin daily as needed (for back pain). Remove & Discard patch within 12 hours or as directed by MD         . MELATONIN EXTRA STRENGTH PO   Oral   Take 15 mLs by mouth at bedtime as needed (sleep).          . memantine (NAMENDA) 10 MG tablet   Oral   Take 10 mg by mouth 2 (two) times daily.         . Multiple Vitamin (MULTIVITAMIN WITH MINERALS) TABS   Oral   Take 1 tablet by mouth daily.         . Multiple Vitamins-Minerals (ICAPS AREDS FORMULA PO)   Oral   Take 2 tablets by mouth 2 (two) times daily.         . polyethylene glycol (MIRALAX / GLYCOLAX) packet   Oral   Take 17 g by mouth daily.         . Probiotic Product (PROBIOTIC PO)   Oral   Take 1 capsule by mouth daily. Ultra Flora          BP 165/79  Pulse 49  Temp(Src) 98.6 F (37 C) (Oral)  Resp 15  SpO2 99% Physical Exam  Constitutional: He is oriented to person, place, and time. He appears well-developed and well-nourished. No distress.  HENT:  Head: Normocephalic and atraumatic.  Mouth/Throat: No oropharyngeal exudate.  Eyes: Pupils are equal, round, and reactive to light.  Neck: Normal range  of motion. Neck supple.  Cardiovascular: Regular rhythm and normal heart sounds.  Bradycardia present.  Exam reveals no gallop and no friction rub.   No murmur heard. Pulmonary/Chest: Effort normal and breath sounds normal. No respiratory distress. He has no wheezes. He has no rales.  Abdominal: Soft. Bowel sounds are normal. He exhibits no distension and no mass. There is no tenderness. There is no rebound and no guarding.  Musculoskeletal: Normal range of motion. He exhibits no edema and no tenderness.  Neurological: He is alert and oriented to person, place, and time. No cranial nerve deficit. He exhibits abnormal muscle tone. GCS eye subscore is 4. GCS verbal subscore is 4. GCS motor subscore is 6.  Unable to answer to sensation testing,   Skin: Skin is warm and dry.  Psychiatric: He has a normal mood and affect.    ED Course  Procedures (including critical care time) Labs Review Labs Reviewed  CBC WITH DIFFERENTIAL - Abnormal; Notable for the following:    RBC 4.07 (*)    Hemoglobin 11.6 (*)    HCT 36.1 (*)    Neutrophils Relative % 78 (*)    All other components within normal limits  COMPREHENSIVE METABOLIC PANEL - Abnormal; Notable for the following:    GFR calc non Af Amer 76 (*)    GFR calc Af Amer 88 (*)    All other components within normal limits  URINALYSIS, ROUTINE W REFLEX MICROSCOPIC - Abnormal; Notable for the following:    APPearance CLOUDY (*)    Hgb urine dipstick LARGE (*)  Leukocytes, UA MODERATE (*)    All other components within normal limits  URINE CULTURE  TROPONIN I  LACTIC ACID, PLASMA  URINE MICROSCOPIC-ADD ON   Imaging Review Dg Chest 2 View  10/18/2013   CLINICAL DATA:  Loss of consciousness.  EXAM: CHEST  2 VIEW  COMPARISON:  Chest radiograph 09/09/2013. Chest radiograph 02/11/2013  FINDINGS: Patient is mildly rotated. Stable enlarged cardiac contours with tortuosity and calcification of the thoracic aorta. Minimal bibasilar atelectasis and or  scarring. No significant interval change in scattered small nodular densities. No definite pleural effusion or pneumothorax. Regional skeleton is unremarkable.  IMPRESSION: No significant interval change multiple tiny densities when compared to 02/11/2013.  Cardiomegaly.  No focal airspace consolidation.   Electronically Signed   By: Annia Belt M.D.   On: 10/18/2013 10:32   Ct Head Wo Contrast  10/18/2013   CLINICAL DATA:  Syncope, history of Alzheimer's dementia  EXAM: CT HEAD WITHOUT CONTRAST  TECHNIQUE: Contiguous axial images were obtained from the base of the skull through the vertex without intravenous contrast.  COMPARISON:  06/08/2013  FINDINGS: No skull fracture is identified. No intracranial hemorrhage, mass effect or midline shift. No acute cortical infarction. No mass lesion is noted on this unenhanced scan. Stable atrophy and chronic white matter disease. Metallic dental artifacts are noted. Mucosal thickening with partial opacification bilateral sphenoid sinus again noted.  IMPRESSION: No acute intracranial abnormality. Stable atrophy and chronic white matter disease.   Electronically Signed   By: Natasha Mead M.D.   On: 10/18/2013 13:49    EKG Interpretation    Date/Time:  Tuesday October 18 2013 08:43:11 EST Ventricular Rate:  50 PR Interval:  133 QRS Duration: 180 QT Interval:  480 QTC Calculation: 438 R Axis:   -19 Text Interpretation:  Sinus rhythm Right bundle branch block Left ventricular hypertrophy .  Unchanged from prior Confirmed by DOCHERTY  MD, MEGAN [id number]) on 10/18/2013 8:57:15 AM            MDM   1. Syncope   2. Chest pain   3. Alzheimer disease   4. Atypical chest pain    Pt is a 79 y.o. male with Pmhx as above who presents with fluctuating coordination & mental status for past several weeks, now this morning complaining that his "heart" hurt, and later had a syncope episode.  Pt unable to describe pre-syncope symptoms.  He has had syncopal episodes for  years, but does not typically complain of chest pain.  Currently complaints only of dental pain at site of loose bridge that was repaired yesterday.  On PE, pt is bradycardic, no focal neuro findings, though has difficulty following commands & cannot do cerebellar testing. UA done after chronic indwelling foley changed, questionable for UTI.  CBC, CMP, trop, CXT unremarkable.  Have spoken to [facility] who request addition of CT head which was also unremarkable.  [facility] will admit for ACS w/u.         Shanna Cisco, MD 10/18/13 [phone]

## 2013-10-18 NOTE — ED Notes (Signed)
Pt resting quietly at the time. Vital signs stable. Denies pain. Family remains at bedside. He is alert and answering most questions appropriately.

## 2013-10-18 NOTE — ED Notes (Signed)
Pt presents to department via [facility] for evaluation of syncopal episode. Family states he was standing when he suddenly slumped over and was assisted to chair. Upon arrival pt is alert to self, answers most questions correctly. Denies pain. 20g L forearm. CBG 118. History of syncope in the past. Foley catheter upon arrival. Lives at home with family.

## 2013-10-19 DIAGNOSIS — R55 Syncope and collapse: Principal | ICD-10-CM

## 2013-10-19 LAB — TROPONIN I: Troponin I: <0.3 ng/mL (ref <0.30)

## 2013-10-19 LAB — URINE CULTURE: Colony Count: >=100000

## 2013-10-19 MED ORDER — PANTOPRAZOLE SODIUM 40 MG PO TBEC: 40.0000 mg | DELAYED_RELEASE_TABLET | Freq: Every day | ORAL | Status: DC

## 2013-10-19 NOTE — Discharge Summary (Signed)
[facility]                                                                                   Ricky [name], is a 79 y.o. male  DOB [birth year]  MRN [id number].  Admission date:  10/18/2013  Admitting Physician  Marinda Elk, MD  Discharge Date:  10/19/2013   Primary MD  Bufford Spikes, DO  Recommendations for primary care physician for things to follow:   Follow clinically   Admission Diagnosis  Alzheimer disease [331.0] Syncope [780.2] Atypical chest pain [786.59] Chest pain [786.50]  Discharge Diagnosis   atypical non-cardiac chest pain likely from GERD  Principal Problem:   Chest pain Active Problems:   ALZHEIMERS DISEASE   Dementia   Atypical chest pain      Past Medical History  Diagnosis Date  . IBS (irritable bowel syndrome)   . Diverticulosis   . Cataract   . OAB (overactive bladder)   . Macular degeneration   . Alzheimer disease   . Hypertension   . Arthritis   . Osteoporosis   . Parkinsonian syndrome   . Syncope, vasovagal   . Neuromuscular disorder     parkisonian syndrome  . 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 Unspecified transient cerebral ischemia   . Unspecified urinary incontinence   . Senile dementia, uncomplicated   . Unspecified constipation   . Hypothyroidism   . Anemia, unspecified   . Depression   . Alzheimer's disease   . Gout, unspecified   . Thoracic or lumbosacral neuritis or radiculitis, unspecified   . Progressive supranuclear palsy   . Unspecified transient cerebral ischemia   . Unspecified urinary incontinence   . Syncope and collapse   . Paralysis agitans   . Unspecified transient cerebral ischemia   . Pneumonitis due to inhalation of food or vomitus   . Cellulitis and abscess of oral soft tissues   . Screening for lipoid disorders   . Senile dementia, uncomplicated   . Unspecified constipation   . Disorder of bone and cartilage, unspecified   . Other specified acquired  hypothyroidism   . Unspecified hypothyroidism   . Anemia, unspecified   . Depressive disorder, not elsewhere classified   . Alzheimer's disease   . Paralysis agitans   . Pneumonia, organism unspecified   . Pseudomonas infection in conditions classified elsewhere and of unspecified site   . Unspecified essential hypertension   . Urinary tract infection, site not specified   . Osteoarthrosis, unspecified whether generalized or localized, unspecified site   . Osteoporosis, unspecified   . Gout, unspecified   . Thoracic or lumbosacral neuritis or radiculitis, unspecified   . Stroke     Past Surgical History  Procedure Laterality Date  . Esophagogastroduodenoscopy    . Eye surgery  2006    LEFT CATARACT     Discharge Condition: Stable   Follow-up Information   Follow up with REED, TIFFANY, DO. Schedule an appointment as soon as possible for a visit in 1 week.   Specialty:  Geriatric Medicine   Contact information:   [address] [place] [address] [phone]  Follow up with Dietrich Pates, MD. Schedule an appointment as soon as possible for a visit in 1 week.   Specialty:  Cardiology   Contact information:   [address] [place] [address] [phone]         Consults obtained - Dr. Eden Emms cardiologist on call over the phone   Discharge Medications      Medication List         acetaminophen 500 MG tablet  Commonly known as:  TYLENOL  Take 500 mg by mouth 3 (three) times daily.     aspirin 325 MG EC tablet  Take 1 tablet (325 mg total) by mouth daily.     atorvastatin 10 MG tablet  Commonly known as:  LIPITOR  Take 1 tablet (10 mg total) by mouth daily at 6 PM.     bacitracin ointment  Apply 1 application topically 2 (two) times daily. Applied to tip of penis for skin tear.     CALTRATE 600+D PLUS MINERALS 600-800 MG-UNIT Chew  Chew 1 tablet by mouth 2 (two) times daily with a meal.     CRANBERRY PO  Take 4,200 mg by  mouth 3 (three) times daily.     D-Mannose Powd  Take 2,000 mg by mouth 2 (two) times daily.     ferrous sulfate 325 (65 FE) MG tablet  Take 325 mg by mouth every other day.     guaiFENesin 600 MG 12 hr tablet  Commonly known as:  MUCINEX  Take 1,200 mg by mouth 2 (two) times daily as needed for congestion.     hydrALAZINE 25 MG tablet  Commonly known as:  APRESOLINE  Take 0.5 tablets (12.5 mg total) by mouth 2 (two) times daily.     ICAPS AREDS FORMULA PO  Take 2 tablets by mouth 2 (two) times daily.     lactose free nutrition Liqd  Take 90 mLs by mouth 2 (two) times daily between meals.     levothyroxine 25 MCG tablet  Commonly known as:  SYNTHROID, LEVOTHROID  Take 25 mcg by mouth daily before breakfast.     lidocaine 5 %  Commonly known as:  LIDODERM  Place 1 patch onto the skin daily as needed (for back pain). Remove & Discard patch within 12 hours or as directed by MD     MELATONIN EXTRA STRENGTH PO  Take 15 mLs by mouth at bedtime as needed (sleep).     memantine 10 MG tablet  Commonly known as:  NAMENDA  Take 10 mg by mouth 2 (two) times daily.     multivitamin with minerals Tabs tablet  Take 1 tablet by mouth daily.     pantoprazole 40 MG tablet  Commonly known as:  PROTONIX  Take 1 tablet (40 mg total) by mouth daily. Switch for any other PPI at similar dose and frequency     polyethylene glycol packet  Commonly known as:  MIRALAX / GLYCOLAX  Take 17 g by mouth daily.     PROBIOTIC PO  Take 1 capsule by mouth daily. Ultra Flora     Vitamin B-12 2500 MCG Subl  Place 2,500 mcg under the tongue 2 (two) times daily.         Diet and Activity recommendation: See Discharge Instructions below   Discharge Instructions     Follow with Primary MD REED, TIFFANY, DO in 7 days   Get CBC, CMP, checked 7 days by Primary MD and again as instructed  by your Primary MD.   Get Medicines reviewed and adjusted.  Please request your Prim.MD to go over all  Hospital Tests and Procedure/Radiological results at the follow up, please get all Hospital records sent to your Prim MD by signing hospital release before you go home.  Activity: As tolerated with Full fall precautions use walker/cane & assistance as needed   Diet:  Heart healthy mechanical soft diet with full feeding assistance and aspiration precautions  For Heart failure patients - Check your Weight same time everyday, if you gain over 2 pounds, or you develop in leg swelling, experience more shortness of breath or chest pain, call your Primary MD immediately. Follow Cardiac Low Salt Diet and 1.8 lit/day fluid restriction.  Disposition Home  If you experience worsening of your admission symptoms, develop shortness of breath, life threatening emergency, suicidal or homicidal thoughts you must seek medical attention immediately by calling 911 or calling your MD immediately  if symptoms less severe.  You Must read complete instructions/literature along with all the possible adverse reactions/side effects for all the Medicines you take and that have been prescribed to you. Take any new Medicines after you have completely understood and accpet all the possible adverse reactions/side effects.   Do not drive and provide baby sitting services if your were admitted for syncope or siezures until you have seen by Primary MD or a Neurologist and advised to do so again.  Do not drive when taking Pain medications.    Do not take more than prescribed Pain, Sleep and Anxiety Medications  Special Instructions: If you have smoked or chewed Tobacco  in the last 2 yrs please stop smoking, stop any regular Alcohol  and or any Recreational drug use.  Wear Seat belts while driving.   Please note  You were cared for by a hospitalist during your hospital stay. If you have any questions about your discharge medications or the care you received while you were in the hospital after you are discharged, you can  call the unit and asked to speak with the hospitalist on call if the hospitalist that took care of you is not available. Once you are discharged, your primary care physician will handle any further medical issues. Please note that NO REFILLS for any discharge medications will be authorized once you are discharged, as it is imperative that you return to your primary care physician (or establish a relationship with a primary care physician if you do not have one) for your aftercare needs so that they can reassess your need for medications and monitor your lab values.   Major procedures and Radiology Reports - PLEASE review detailed and final reports for all details, in brief -      Dg Chest 2 View  10/18/2013   CLINICAL DATA:  Loss of consciousness.  EXAM: CHEST  2 VIEW  COMPARISON:  Chest radiograph 09/09/2013. Chest radiograph 02/11/2013  FINDINGS: Patient is mildly rotated. Stable enlarged cardiac contours with tortuosity and calcification of the thoracic aorta. Minimal bibasilar atelectasis and or scarring. No significant interval change in scattered small nodular densities. No definite pleural effusion or pneumothorax. Regional skeleton is unremarkable.  IMPRESSION: No significant interval change multiple tiny densities when compared to 02/11/2013.  Cardiomegaly.  No focal airspace consolidation.   Electronically Signed   By: Annia Belt M.D.   On: 10/18/2013 10:32   Ct Head Wo Contrast  10/18/2013   CLINICAL DATA:  Syncope, history of Alzheimer's dementia  EXAM: CT HEAD WITHOUT  CONTRAST  TECHNIQUE: Contiguous axial images were obtained from the base of the skull through the vertex without intravenous contrast.  COMPARISON:  06/08/2013  FINDINGS: No skull fracture is identified. No intracranial hemorrhage, mass effect or midline shift. No acute cortical infarction. No mass lesion is noted on this unenhanced scan. Stable atrophy and chronic white matter disease. Metallic dental artifacts are noted.  Mucosal thickening with partial opacification bilateral sphenoid sinus again noted.  IMPRESSION: No acute intracranial abnormality. Stable atrophy and chronic white matter disease.   Electronically Signed   By: Natasha Mead M.D.   On: 10/18/2013 13:49    Micro Results     Recent Results (from the past 240 hour(s))  URINE CULTURE     Status: None   Collection Time    10/18/13 11:08 AM      Result Value Range Status   Specimen Description URINE, CATHETERIZED   Final   Special Requests NONE   Final   Culture  Setup Time     Final   Value: 10/18/2013 11:34     Performed at [facility] Count     Final   Value: >=100,000 COLONIES/ML     Performed at [facility]   Culture     Final   Value: YEAST     Performed at [facility]   Report Status 10/19/2013 FINAL   Final  MRSA PCR SCREENING     Status: None   Collection Time    10/19/13 10:58 AM      Result Value Range Status   MRSA by PCR NEGATIVE  NEGATIVE Final   Comment:            The GeneXpert MRSA Assay (FDA     approved for NASAL specimens     only), is one component of a     comprehensive MRSA colonization     surveillance program. It is not     intended to diagnose MRSA     infection nor to guide or     monitor treatment for     MRSA infections.     History of present illness and  Hospital Course:     Kindly see H&P for history of present illness and admission details, please review complete Labs, Consult reports and Test reports for all details in brief Ricky [name], is a 79 y.o. male, patient with history of   dementia, bed bound with chronic lower extremity contractures, hypertension, Parkinson syndrome, vasovagal syncope, GERD, low vitamin B 12 levels, dyslipidemia who had a dental procedure yesterday and subsequently developed some lightheadedness and chest discomfort and was brought to the ER. In the ER he was conscious and at his baseline in terms of mentation however he did  complain of some atypical chest pain, his EKG did not show any acute changes and he ruled out for MI with serial negative troponin.    This morning he is completely symptom free and at his baseline eager to go home, I discussed his case with cardiologist on call Dr.Nishan recommends no further cardiac workup and to be sent home on home medications with the addition of Protonix as patient's chest pain was relieved with GI cocktail.   I have discussed his case with his family members patient usually follows with Dr. Dietrich Pates for cardiac issues and he will do so in one week post discharge in the outpatient setting. On his home medications will be continued unchanged. His mild  presyncope at home could have been costly to medication side effect from his dental procedure the day yesterday. Of note he has history of Parkinson's disease and has had previous episodes of vasovagal syncope also at home.     Today   Subjective:   Ricky [name] today has no headache,no chest abdominal pain,no new weakness tingling or numbness, feels much better wants to go home today.   Objective:   Blood pressure 143/84, pulse 68, temperature 98.7 F (37.1 C), temperature source Oral, resp. rate 18, SpO2 96.00%.   Intake/Output Summary (Last 24 hours) at 10/19/13 1324 Last data filed at 10/19/13 0900  Gross per 24 hour  Intake    240 ml  Output      0 ml  Net    240 ml    Exam Awake Alert, Oriented *3, No new F.N deficits, Normal affect [place].AT,PERRAL Supple Neck,No JVD, No cervical lymphadenopathy appriciated.  Symmetrical Chest wall movement, Good air movement bilaterally, CTAB RRR,No Gallops,Rubs or new Murmurs, No Parasternal Heave +ve B.Sounds, Abd Soft, Non tender, No organomegaly appriciated, No rebound -guarding or rigidity. No Cyanosis, Clubbing or edema, No new Rash or bruise  Data Review   CBC w Diff: Lab Results  Component Value Date   WBC 4.8 10/18/2013   WBC 6.0 08/29/2013   HGB 11.6*  10/18/2013   HCT 35.2* 10/18/2013   PLT 178 10/18/2013   LYMPHOPCT 14 10/18/2013   MONOPCT 7 10/18/2013   EOSPCT 1 10/18/2013   BASOPCT 0 10/18/2013    CMP: Lab Results  Component Value Date   NA 142 10/18/2013   NA 139 08/29/2013   K 4.7 10/18/2013   CL 104 10/18/2013   CO2 25 10/18/2013   BUN 7 10/18/2013   BUN 6* 08/29/2013   CREATININE 0.72 10/18/2013   PROT 7.8 10/18/2013   PROT 7.6 02/10/2013   ALBUMIN 3.7 10/18/2013   BILITOT 0.5 10/18/2013   ALKPHOS 108 10/18/2013   AST 24 10/18/2013   ALT 15 10/18/2013  .   Total Time in preparing paper work, data evaluation and todays exam - 35 minutes  Leroy Sea M.D on 10/19/2013 at 1:24 PM  [facility] Office  [phone]

## 2013-10-20 ENCOUNTER — Emergency Department (HOSPITAL_COMMUNITY)
Admission: EM | Admit: 2013-10-20 | Discharge: 2013-10-21 | Disposition: A | Discharge status: Home / Self Care | Payer: Medicare HMO | Attending: Emergency Medicine | Admitting: Emergency Medicine

## 2013-10-20 ENCOUNTER — Encounter (HOSPITAL_COMMUNITY): Payer: Self-pay | Admitting: Emergency Medicine

## 2013-10-20 DIAGNOSIS — Z8639 Personal history of other endocrine, nutritional and metabolic disease: Secondary | ICD-10-CM | POA: Insufficient documentation

## 2013-10-20 DIAGNOSIS — Z862 Personal history of diseases of the blood and blood-forming organs and certain disorders involving the immune mechanism: Secondary | ICD-10-CM | POA: Insufficient documentation

## 2013-10-20 DIAGNOSIS — R310 Gross hematuria: Secondary | ICD-10-CM | POA: Insufficient documentation

## 2013-10-20 DIAGNOSIS — K589 Irritable bowel syndrome without diarrhea: Secondary | ICD-10-CM | POA: Insufficient documentation

## 2013-10-20 DIAGNOSIS — R197 Diarrhea, unspecified: Secondary | ICD-10-CM | POA: Insufficient documentation

## 2013-10-20 DIAGNOSIS — Z8679 Personal history of other diseases of the circulatory system: Secondary | ICD-10-CM | POA: Insufficient documentation

## 2013-10-20 DIAGNOSIS — Z79899 Other long term (current) drug therapy: Secondary | ICD-10-CM | POA: Insufficient documentation

## 2013-10-20 DIAGNOSIS — F0390 Unspecified dementia without behavioral disturbance: Secondary | ICD-10-CM | POA: Insufficient documentation

## 2013-10-20 DIAGNOSIS — Z87440 Personal history of urinary (tract) infections: Secondary | ICD-10-CM | POA: Insufficient documentation

## 2013-10-20 DIAGNOSIS — Z87891 Personal history of nicotine dependence: Secondary | ICD-10-CM | POA: Insufficient documentation

## 2013-10-20 DIAGNOSIS — A0472 Enterocolitis due to Clostridium difficile, not specified as recurrent: Secondary | ICD-10-CM

## 2013-10-20 DIAGNOSIS — Z87448 Personal history of other diseases of urinary system: Secondary | ICD-10-CM | POA: Insufficient documentation

## 2013-10-20 DIAGNOSIS — G238 Other specified degenerative diseases of basal ganglia: Secondary | ICD-10-CM | POA: Insufficient documentation

## 2013-10-20 DIAGNOSIS — F0280 Dementia in other diseases classified elsewhere without behavioral disturbance: Secondary | ICD-10-CM | POA: Insufficient documentation

## 2013-10-20 DIAGNOSIS — Z466 Encounter for fitting and adjustment of urinary device: Secondary | ICD-10-CM | POA: Insufficient documentation

## 2013-10-20 DIAGNOSIS — H269 Unspecified cataract: Secondary | ICD-10-CM | POA: Insufficient documentation

## 2013-10-20 DIAGNOSIS — M810 Age-related osteoporosis without current pathological fracture: Secondary | ICD-10-CM | POA: Insufficient documentation

## 2013-10-20 DIAGNOSIS — G309 Alzheimer's disease, unspecified: Secondary | ICD-10-CM | POA: Insufficient documentation

## 2013-10-20 DIAGNOSIS — E039 Hypothyroidism, unspecified: Secondary | ICD-10-CM | POA: Insufficient documentation

## 2013-10-20 DIAGNOSIS — H3530 Unspecified macular degeneration: Secondary | ICD-10-CM | POA: Insufficient documentation

## 2013-10-20 DIAGNOSIS — G219 Secondary parkinsonism, unspecified: Secondary | ICD-10-CM | POA: Insufficient documentation

## 2013-10-20 DIAGNOSIS — Z7982 Long term (current) use of aspirin: Secondary | ICD-10-CM | POA: Insufficient documentation

## 2013-10-20 DIAGNOSIS — Z8701 Personal history of pneumonia (recurrent): Secondary | ICD-10-CM | POA: Insufficient documentation

## 2013-10-20 DIAGNOSIS — Z8719 Personal history of other diseases of the digestive system: Secondary | ICD-10-CM | POA: Insufficient documentation

## 2013-10-20 DIAGNOSIS — Z8673 Personal history of transient ischemic attack (TIA), and cerebral infarction without residual deficits: Secondary | ICD-10-CM | POA: Insufficient documentation

## 2013-10-20 DIAGNOSIS — F3289 Other specified depressive episodes: Secondary | ICD-10-CM | POA: Insufficient documentation

## 2013-10-20 DIAGNOSIS — I10 Essential (primary) hypertension: Secondary | ICD-10-CM | POA: Insufficient documentation

## 2013-10-20 DIAGNOSIS — D649 Anemia, unspecified: Secondary | ICD-10-CM | POA: Insufficient documentation

## 2013-10-20 DIAGNOSIS — M129 Arthropathy, unspecified: Secondary | ICD-10-CM | POA: Insufficient documentation

## 2013-10-20 DIAGNOSIS — F329 Major depressive disorder, single episode, unspecified: Secondary | ICD-10-CM | POA: Insufficient documentation

## 2013-10-20 DIAGNOSIS — Z8619 Personal history of other infectious and parasitic diseases: Secondary | ICD-10-CM | POA: Insufficient documentation

## 2013-10-20 LAB — BASIC METABOLIC PANEL
BUN: 8 mg/dL (ref 6–23)
CO2: 26 mEq/L (ref 19–32)
Calcium: 8.8 mg/dL (ref 8.4–10.5)
Chloride: 99 mEq/L (ref 96–112)
Creatinine, Ser: 0.8 mg/dL (ref 0.50–1.35)
GFR calc Af Amer: 86 mL/min — ABNORMAL LOW (ref >90)
GFR calc non Af Amer: 74 mL/min — ABNORMAL LOW (ref >90)
Glucose, Bld: 129 mg/dL — ABNORMAL HIGH (ref 70–99)
Potassium: 3.7 mEq/L (ref 3.5–5.1)
Sodium: 135 mEq/L (ref 135–145)

## 2013-10-20 LAB — URINE MICROSCOPIC-ADD ON

## 2013-10-20 LAB — CBC WITH DIFFERENTIAL/PLATELET
Basophils Relative: 0 % (ref 0–1)
Eosinophils Absolute: 0.1 10*3/uL (ref 0.0–0.7)
Eosinophils Relative: 1 % (ref 0–5)
HCT: 33.6 % — ABNORMAL LOW (ref 39.0–52.0)
Hemoglobin: 11.1 g/dL — ABNORMAL LOW (ref 13.0–17.0)
MCHC: 33 g/dL (ref 30.0–36.0)
MCV: 86.8 fL (ref 78.0–100.0)
Monocytes Absolute: 0.7 10*3/uL (ref 0.1–1.0)
Monocytes Relative: 11 % (ref 3–12)
Neutro Abs: 4.1 10*3/uL (ref 1.7–7.7)
Platelets: 163 10*3/uL (ref 150–400)
RBC: 3.87 MIL/uL — ABNORMAL LOW (ref 4.22–5.81)

## 2013-10-20 LAB — URINALYSIS, ROUTINE W REFLEX MICROSCOPIC
Bilirubin Urine: NEGATIVE
Glucose, UA: NEGATIVE mg/dL
Ketones, ur: NEGATIVE mg/dL
Nitrite: NEGATIVE
Protein, ur: 30 mg/dL — AB
Specific Gravity, Urine: 1.007 (ref 1.005–1.030)
Urobilinogen, UA: 0.2 mg/dL (ref 0.0–1.0)
pH: 6.5 (ref 5.0–8.0)

## 2013-10-20 NOTE — ED Notes (Signed)
Spoke with patients daughter. ETA 20 mins. Greta Doom PA updated.

## 2013-10-20 NOTE — ED Notes (Signed)
PT was seen here Tues and admitted for a "fainting spell".  Foley catheter was placed in ED Tues for inability to void.  Family noticed dark red blood in foley bag this am.  Family gave him many glasses of water and states the blood is "clearer" than it was.  Pt denies any pain.

## 2013-10-20 NOTE — ED Provider Notes (Addendum)
Medical screening examination/treatment/procedure(s) were conducted as a shared visit with non-physician practitioner(s) and myself.  I personally evaluated the patient during the encounter.  EKG Interpretation   None      89yM with hematuria. PMHx of progressive supranuclear palsy, failure to thrive and recurrent infections including UTIs and aspiration pneumonia, urinary retention with chronic indwelling foley catheter and hospitalizations this year with sepsis with Serratia and MRS. Lives with son and has home nursing. Today noticed hematuria with clots. Urine clearing while in ED. UA otherwise pretty unremarkable. H/H at baseline. No hypotension. No blood thinners aside from ASA. From standpoiint of hematuria, I feel he is appropriate for DC.  Son has a multitude of other concerns and doesn't feel comfortable taking father home though. Lengthy had by myself and Greta Doom. They are reasonable concerns, but also difficult to objectively quantify. "He is not his normal self. We can tell something is up." "His face looks different." "He isn't laughing at my jokes like he normally would." Multiple other vague concerns.  Will check a rectal temp. Son also reporting diffuse watery diarrhea over past 36 hours. Hx of c-diff, recently been on various abx and also in hospital. I think reasonable to check cdiff as well although nursing reporting that no diarrhea since being in ED.   Spoke with medicine for possible admit for observation. Son has since left ED though. At this point I see little utility in admitting pt to the hospital when he would more than likely just be discharged in a few hours. Pt has already been in ED over 11 hours. Will continue to observe in Pod C overnight. DC in morning and outpt urology follow-up of hematuria unless there is an acute change. Urologist Dr Brunilda Payor.    Raeford Razor, MD 10/24/13 [phone]

## 2013-10-20 NOTE — ED Notes (Signed)
Pt. Family emptied foley bag around 10:30 which had approx. of bloddy urine.

## 2013-10-20 NOTE — ED Notes (Signed)
Per Laveda Norman PA hold Continuous irrigation.Family states he will try to urinate.

## 2013-10-20 NOTE — ED Provider Notes (Signed)
CSN: [id number]     Arrival date & time 10/20/13  1314 History   First MD Initiated Contact with Patient 10/20/13 1520     Chief Complaint  Patient presents with  . Hematuria   (Consider location/radiation/quality/duration/timing/severity/associated sxs/prior Treatment) HPI  79 year old male with multiple comorbidities including Alzheimer, vasovagal syncope, worsening in syndrome, progressive supranuclear palsy, and benign prostatic hypertrophy presents for evaluations of dark red blood in Foley bag. History obtained through son who is at bedside. The patient has a chronic indwelling catheter since January of this year. He has had prior episodes of hematuria on the initial onset of this year and has had intermittent hematuria according to family members. Patient was admitted and to the hospital 2 days ago for evaluation of a syncopal episode. He had his Foley catheter changed in the ED 2 days ago. He was discharge home and for the past 2 days he was at his baseline. At 9:00 this morning son notice frank blood in the Foley bag. Follows by 2-3 hours of no urine production. Patient was encouraged to drink plenty of fluids. Eventually patient was able to produce large amount of urine with moderate clot burden. Urine has become clearer and less clot with small fluid intake. Patient otherwise denies any pain. No other complaints. Patient was brought to the ED for evaluation of his hematuria. Patient is not on any blood thinner medication. He does have a urologist.  Past Medical History  Diagnosis Date  . IBS (irritable bowel syndrome)   . Diverticulosis   . Cataract   . OAB (overactive bladder)   . Macular degeneration   . Alzheimer disease   . Hypertension   . Arthritis   . Osteoporosis   . Parkinsonian syndrome   . Syncope, vasovagal   . Neuromuscular disorder     parkisonian syndrome  . 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 Unspecified transient cerebral  ischemia   . Unspecified urinary incontinence   . Senile dementia, uncomplicated   . Unspecified constipation   . Hypothyroidism   . Anemia, unspecified   . Depression   . Alzheimer's disease   . Gout, unspecified   . Thoracic or lumbosacral neuritis or radiculitis, unspecified   . Progressive supranuclear palsy   . Unspecified transient cerebral ischemia   . Unspecified urinary incontinence   . Syncope and collapse   . Paralysis agitans   . Unspecified transient cerebral ischemia   . Pneumonitis due to inhalation of food or vomitus   . Cellulitis and abscess of oral soft tissues   . Screening for lipoid disorders   . Senile dementia, uncomplicated   . Unspecified constipation   . Disorder of bone and cartilage, unspecified   . Other specified acquired hypothyroidism   . Unspecified hypothyroidism   . Anemia, unspecified   . Depressive disorder, not elsewhere classified   . Alzheimer's disease   . Paralysis agitans   . Pneumonia, organism unspecified   . Pseudomonas infection in conditions classified elsewhere and of unspecified site   . Unspecified essential hypertension   . Urinary tract infection, site not specified   . Osteoarthrosis, unspecified whether generalized or localized, unspecified site   . Osteoporosis, unspecified   . Gout, unspecified   . Thoracic or lumbosacral neuritis or radiculitis, unspecified   . Stroke    Past Surgical History  Procedure Laterality Date  . Esophagogastroduodenoscopy    . Eye surgery  2006    LEFT CATARACT  Family History  Problem Relation Age of Onset  . Cancer Mother   . Stroke Father   . Liver disease Father   . Kidney disease Brother   . Liver disease Brother   . Emphysema Brother    History  Substance Use Topics  . Smoking status: Former Games developer  . Smokeless tobacco: Never Used     Comment: QUIT SMOKING BACK IN THE LATE 50'S  . Alcohol Use: No    Review of Systems  Unable to perform ROS: Dementia     Allergies  Other  Home Medications   Current Outpatient Rx  Name  Route  Sig  Dispense  Refill  . acetaminophen (TYLENOL) 500 MG tablet   Oral   Take 500 mg by mouth 3 (three) times daily.          Marland Kitchen aspirin EC 325 MG EC tablet   Oral   Take 1 tablet (325 mg total) by mouth daily.   30 tablet   3   . atorvastatin (LIPITOR) 10 MG tablet   Oral   Take 1 tablet (10 mg total) by mouth daily at 6 PM.   90 tablet   1   . bacitracin ointment   Topical   Apply 1 application topically 2 (two) times daily. Applied to tip of penis for skin tear.         . Calcium Carbonate-Vit D-Min (CALTRATE 600+D PLUS) 600-800 MG-UNIT CHEW   Oral   Chew 1 tablet by mouth 2 (two) times daily with a meal.          . CRANBERRY PO   Oral   Take 4,200 mg by mouth 3 (three) times daily.         . Cyanocobalamin (VITAMIN B-12) 2500 MCG SUBL   Sublingual   Place 2,500 mcg under the tongue 2 (two) times daily.         . D-Mannose POWD   Oral   Take 2,000 mg by mouth 2 (two) times daily.         . ferrous sulfate 325 (65 FE) MG tablet   Oral   Take 325 mg by mouth every other day.          Marland Kitchen guaiFENesin (MUCINEX) 600 MG 12 hr tablet   Oral   Take 1,200 mg by mouth 2 (two) times daily as needed for congestion.          . hydrALAZINE (APRESOLINE) 25 MG tablet   Oral   Take 0.5 tablets (12.5 mg total) by mouth 2 (two) times daily.   45 tablet   1   . lactose free nutrition (BOOST) LIQD   Oral   Take 90 mLs by mouth 2 (two) times daily between meals.          Marland Kitchen levothyroxine (SYNTHROID, LEVOTHROID) 25 MCG tablet   Oral   Take 25 mcg by mouth daily before breakfast.         . lidocaine (LIDODERM) 5 %   Transdermal   Place 1 patch onto the skin daily as needed (for back pain). Remove & Discard patch within 12 hours or as directed by MD         . MELATONIN EXTRA STRENGTH PO   Oral   Take 15 mLs by mouth at bedtime as needed (sleep).          . memantine  (NAMENDA) 10 MG tablet   Oral   Take 10 mg by mouth 2 (two)  times daily.         . Multiple Vitamin (MULTIVITAMIN WITH MINERALS) TABS   Oral   Take 1 tablet by mouth daily.         . Multiple Vitamins-Minerals (ICAPS AREDS FORMULA PO)   Oral   Take 2 tablets by mouth 2 (two) times daily.         . pantoprazole (PROTONIX) 40 MG tablet   Oral   Take 1 tablet (40 mg total) by mouth daily. Switch for any other PPI at similar dose and frequency   30 tablet   3   . polyethylene glycol (MIRALAX / GLYCOLAX) packet   Oral   Take 17 g by mouth daily.         . Probiotic Product (PROBIOTIC PO)   Oral   Take 1 capsule by mouth daily. Ultra Flora          BP 142/81  Pulse 72  Temp(Src) 97.4 F (36.3 C) (Oral)  Resp 18  Wt 175 lb (79.379 kg)  SpO2 96% Physical Exam  Nursing note and vitals reviewed. Constitutional: He appears well-developed and well-nourished. No distress.  Patient is a chronic state of muscle contracture.  HENT:  Head: Normocephalic and atraumatic.  Mouth/Throat: Oropharynx is clear and moist.  Eyes: Conjunctivae are normal.  Neck: Neck supple.  Cardiovascular: Normal rate and regular rhythm.   Pulmonary/Chest: Effort normal and breath sounds normal.  Abdominal: Soft. Bowel sounds are normal. He exhibits no distension. There is no tenderness.  Bladder is nondistended and no significant tenderness to suprapubic region  Genitourinary:  Foley catheter in place, producing cranberry-colored urine with small amount of clots in Foley bag.  Neurological: He is alert.  Skin: No rash noted.    ED Course  Procedures (including critical care time)  3:46 PM Patient with evidence of gross hematuria in Foley bag. Hematuria has improved with increased fluid intake. I suspect hematuria may be related to trauma from recent changing Foley 2 days ago. Family member has not noticed any change in patient's behavior to suggest infection. He is afebrile without any  acute distress. Family member request for urine to be analyzed and a culture should be sent. Furthermore, will check H&H, along with bladder irrigation  5:46 PM Patient is tolerating by mouth without difficulty. His urine is becoming clearer than it was with no new clots. Patient has no discomfort. Family member prefers patient to be monitored for the next few hours which agrees.  7:23 PM Patient able to put out 1000 cc of blood-tinged urine in a 2 hour span. Patient report he is feeling better. Patient is stable for discharge. We'll discuss plan with family.  8:25 PM Care of plan discussed with family.  Family however felt pt need further management in the hospital as they do not feel comfortable taking pt home with persistent gross hematuria.  Pt and family members also mentioned pt has been on several different abx and recently developed recurrent diarrhea.  Pt has prior hx of c.diff and famly members concern of c.diff infection.  My attending has evaluate pt and discussed with family member.    12:30 AM Normal rectal temp, formed stool.  [facility] was consulted and evaluate pt.  Felt pt does not meet criteria for admission.  Plan to have pt stay in Pod C for close monitoring and will discharge in the morning.  Night provider aware of plan.  Will give pt his evening meds.    Labs  Review Labs Reviewed  URINALYSIS, ROUTINE W REFLEX MICROSCOPIC - Abnormal; Notable for the following:    APPearance CLOUDY (*)    Hgb urine dipstick LARGE (*)    Protein, ur 30 (*)    Leukocytes, UA SMALL (*)    All other components within normal limits  CBC WITH DIFFERENTIAL - Abnormal; Notable for the following:    RBC 3.87 (*)    Hemoglobin 11.1 (*)    HCT 33.6 (*)    All other components within normal limits  BASIC METABOLIC PANEL - Abnormal; Notable for the following:    Glucose, Bld 129 (*)    GFR calc non Af Amer 74 (*)    GFR calc Af Amer 86 (*)    All other components within normal limits   URINE CULTURE  CLOSTRIDIUM DIFFICILE BY PCR  URINE MICROSCOPIC-ADD ON   Imaging Review No results found.  EKG Interpretation   None       MDM   1. Hematuria, gross    BP 168/78  Pulse 64  Temp(Src) 97.4 F (36.3 C) (Oral)  Resp 18  Wt 175 lb (79.379 kg)  SpO2 97%     Fayrene Helper, PA-C 10/20/13 2025  Fayrene Helper, PA-C 10/21/13 0031

## 2013-10-21 LAB — CLOSTRIDIUM DIFFICILE BY PCR: Toxigenic C. Difficile by PCR: POSITIVE — AB

## 2013-10-21 MED ORDER — HYDRALAZINE HCL 25 MG PO TABS
12.5000 mg | ORAL_TABLET | Freq: Once | ORAL | Status: AC
  Administered 2013-10-21: 12.5 mg via ORAL
  Filled 2013-10-21 (×2): qty 0.5

## 2013-10-21 MED ORDER — METRONIDAZOLE 500 MG PO TABS: 500.0000 mg | ORAL_TABLET | Freq: Three times a day (TID) | ORAL | Status: DC

## 2013-10-21 MED ORDER — MEMANTINE HCL 10 MG PO TABS
10.0000 mg | ORAL_TABLET | Freq: Once | ORAL | Status: AC
  Administered 2013-10-21: 10 mg via ORAL
  Filled 2013-10-21 (×2): qty 1

## 2013-10-21 NOTE — ED Notes (Signed)
Pharmacy called th send meds.

## 2013-10-21 NOTE — ED Notes (Addendum)
Dr Fayrene Fearing has spoke to lisa on the phone and answered her questions. He has made them aware of pt diagnosis of cdiff

## 2013-10-21 NOTE — ED Provider Notes (Signed)
Pt will be discharged this am per dr. Otilio Carpen instructions  Toy Baker, MD 10/21/13 [phone]

## 2013-10-21 NOTE — ED Notes (Signed)
Pt family has arrived to pick him up

## 2013-10-21 NOTE — ED Notes (Addendum)
Check vitals at 600 then call the son at 700 to come pick the pt up.

## 2013-10-21 NOTE — ED Notes (Signed)
i have finished feeding pt breakfast. Tolerated well. Ate approx 50% of meal

## 2013-10-21 NOTE — ED Notes (Signed)
Attempted to reach pt daughter [name] at [phone]. No answer. Will try again shortly.

## 2013-10-21 NOTE — ED Notes (Signed)
Spoke with patient's son and daughter at this time. They stated that they would arrive to pick up their father "as soon as possible, it will take [place] a little while to get the correct transportation." Next shift RN and charge RN updated at this time.

## 2013-10-21 NOTE — ED Notes (Signed)
Called pt family due to elapsed time since first call. Spoke to [employer].they are enroute.

## 2013-10-22 LAB — URINE CULTURE

## 2013-10-23 ENCOUNTER — Telehealth (HOSPITAL_COMMUNITY): Payer: Self-pay | Admitting: Emergency Medicine

## 2013-10-23 NOTE — ED Notes (Signed)
Post ED Visit - Positive Culture Follow-up  Culture report reviewed by antimicrobial stewardship pharmacist: []  Wes Dulaney, Pharm.D., BCPS []  Celedonio Miyamoto, [address].D., BCPS [x]  Georgina Pillion, Pharm.D., BCPS []  [place], [address].D., BCPS, AAHIVP []  Estella Husk, Pharm.D., BCPS, AAHIVP  Positive urine culture Per Marissa Sciacca PA-C, call and perform symptom check.  Kylie A Holland 10/23/2013, 11:12 AM

## 2013-10-23 NOTE — Progress Notes (Signed)
ED Antimicrobial Stewardship Positive Culture Follow Up   Ricky [name] is an 79 y.o. male who presented to [facility] on 10/20/2013 with a chief complaint of hematuria  Chief Complaint  Patient presents with  . Hematuria    Recent Results (from the past 720 hour(s))  URINE CULTURE     Status: None   Collection Time    10/18/13 11:08 AM      Result Value Range Status   Specimen Description URINE, CATHETERIZED   Final   Special Requests NONE   Final   Culture  Setup Time     Final   Value: 10/18/2013 11:34     Performed at [facility] Count     Final   Value: >=100,000 COLONIES/ML     Performed at [facility]   Culture     Final   Value: YEAST     Performed at [facility]   Report Status 10/19/2013 FINAL   Final  MRSA PCR SCREENING     Status: None   Collection Time    10/19/13 10:58 AM      Result Value Range Status   MRSA by PCR NEGATIVE  NEGATIVE Final   Comment:            The GeneXpert MRSA Assay (FDA     approved for NASAL specimens     only), is one component of a     comprehensive MRSA colonization     surveillance program. It is not     intended to diagnose MRSA     infection nor to guide or     monitor treatment for     MRSA infections.  URINE CULTURE     Status: None   Collection Time    10/20/13  3:39 PM      Result Value Range Status   Specimen Description URINE, RANDOM   Final   Special Requests NONE   Final   Culture  Setup Time     Final   Value: 10/20/2013 17:24     Performed at [facility] Count     Final   Value: 75,000 COLONIES/ML     Performed at [facility]   Culture     Final   Value: YEAST     Performed at [facility]   Report Status 10/22/2013 FINAL   Final  CLOSTRIDIUM DIFFICILE BY PCR     Status: Abnormal   Collection Time    10/20/13 10:37 PM      Result Value Range Status   C difficile by pcr POSITIVE (*) NEGATIVE Final   Comment: CRITICAL RESULT CALLED  TO, READ BACK BY AND VERIFIED WITH:     ANDREWS RN 9:20 10/21/13 ([email or web address])    [x]  No treatment indicated  89 YOM with chronic foley who presented to the [facility] with dark red blood noted in foley bag -- which was noted to improve with increased fluid intake. The patient was noted to also have diarrhea -- CDiff was positive and the patient was given Flagyl. The patient was asymptomatic of a UTI and since the patient has a chronic foley -- would recommend not treating.  New antibiotic prescription: No treatment necessary -- call the patient and get a symptom check per the PA  ED Provider: Raymon Mutton, PA-C  [name] 10/23/2013, 9:47 AM Infectious Diseases Pharmacist Phone# [phone]

## 2013-10-24 NOTE — ED Provider Notes (Signed)
Medical screening examination/treatment/procedure(s) were conducted as a shared visit with non-physician practitioner(s) and myself.  I personally evaluated the patient during the encounter.  EKG Interpretation   None      See other note.   Raeford Razor, MD 10/24/13 [phone]

## 2013-10-25 ENCOUNTER — Other Ambulatory Visit: Payer: Self-pay | Admitting: Adult Health

## 2013-10-31 ENCOUNTER — Ambulatory Visit (INDEPENDENT_AMBULATORY_CARE_PROVIDER_SITE_OTHER): Payer: Medicare HMO | Admitting: Internal Medicine

## 2013-10-31 ENCOUNTER — Encounter: Payer: Self-pay | Admitting: Internal Medicine

## 2013-10-31 VITALS — BP 135/75 | HR 71 | Ht 70.0 in | Wt 170.0 lb

## 2013-10-31 DIAGNOSIS — I639 Cerebral infarction, unspecified: Secondary | ICD-10-CM

## 2013-10-31 DIAGNOSIS — G20A1 Parkinson's disease without dyskinesia, without mention of fluctuations: Secondary | ICD-10-CM

## 2013-10-31 DIAGNOSIS — I10 Essential (primary) hypertension: Secondary | ICD-10-CM

## 2013-10-31 DIAGNOSIS — G20 Parkinson's disease: Secondary | ICD-10-CM

## 2013-10-31 DIAGNOSIS — R55 Syncope and collapse: Secondary | ICD-10-CM

## 2013-10-31 DIAGNOSIS — F0390 Unspecified dementia without behavioral disturbance: Secondary | ICD-10-CM

## 2013-10-31 DIAGNOSIS — I2782 Chronic pulmonary embolism: Secondary | ICD-10-CM

## 2013-10-31 DIAGNOSIS — I6350 Cerebral infarction due to unspecified occlusion or stenosis of unspecified cerebral artery: Secondary | ICD-10-CM

## 2013-10-31 MED ORDER — VANCOMYCIN HCL 250 MG PO CAPS: 250.0000 mg | ORAL_CAPSULE | Freq: Four times a day (QID) | ORAL | Status: DC

## 2013-10-31 MED ORDER — VANCOMYCIN HCL 125 MG PO CAPS: 125.0000 mg | ORAL_CAPSULE | Freq: Four times a day (QID) | ORAL | Status: DC

## 2013-10-31 NOTE — Patient Instructions (Addendum)
Your physician wants you to follow-up in: 5 MONTHS  You will receive a reminder letter in the mail two months in advance. If you don't receive a letter, please call our office to schedule the follow-up appointment.   Your physician has recommended you make the following change in your medication:  START VANCOMYCIN 150 MG CAPSULE FOUR TIMES A DAY /// 1 CAPSULE EVERY 6 HOURS TILL GONE, 10 DAYS.

## 2013-10-31 NOTE — Progress Notes (Signed)
HPI Patient is a 79 yo with h/o PSP, HTN, Altzheimers, ? Parkinsons, hypothyroidism, chronic PE, CVA,  and syncope.  I saw him in the summer. He was recently admitted to [facility] for CP  Had a syncopal spll earlier that day  He was discharged home next day Patient has also been admitted for Diarrhea  Treated for C difficile.   Patient's son says pt has had some very good moments.  Still with intermitt syncopal spells  Also, conitnues to have diarrhea    Allergies  Allergen Reactions  . Other Other (See Comments)    Dairy products- cause swallowing difficulty     Current Outpatient Prescriptions  Medication Sig Dispense Refill  . acetaminophen (TYLENOL) 500 MG tablet Take 500 mg by mouth 3 (three) times daily.       Marland Kitchen aspirin EC 325 MG EC tablet Take 1 tablet (325 mg total) by mouth daily.  30 tablet  3  . atorvastatin (LIPITOR) 10 MG tablet Take 1 tablet (10 mg total) by mouth daily at 6 PM.  90 tablet  1  . bacitracin ointment Apply 1 application topically 2 (two) times daily. Applied to tip of penis for skin tear.      . bifidobacterium infantis (ALIGN) capsule TAKE 1 TABLET BY MY MOUTH TWICE DAILY FOR 14 DAYS.  28 capsule  0  . Calcium Carbonate-Vit D-Min (CALTRATE 600+D PLUS) 600-800 MG-UNIT CHEW Chew 1 tablet by mouth 2 (two) times daily with a meal.       . CRANBERRY PO Take 4,200 mg by mouth 3 (three) times daily.      . Cyanocobalamin (VITAMIN B-12) 2500 MCG SUBL Place 2,500 mcg under the tongue 2 (two) times daily.      . D-Mannose POWD Take 2,000 mg by mouth 2 (two) times daily.      . ferrous sulfate 325 (65 FE) MG tablet Take 325 mg by mouth every other day.       Marland Kitchen guaiFENesin (MUCINEX) 600 MG 12 hr tablet Take 1,200 mg by mouth 2 (two) times daily as needed for congestion.       . hydrALAZINE (APRESOLINE) 25 MG tablet Take 0.5 tablets (12.5 mg total) by mouth 2 (two) times daily.  45 tablet  1  . lactose free nutrition (BOOST) LIQD Take 90 mLs by mouth 2 (two) times daily  between meals.       Marland Kitchen levothyroxine (SYNTHROID, LEVOTHROID) 25 MCG tablet Take 25 mcg by mouth daily before breakfast.      . lidocaine (LIDODERM) 5 % Place 1 patch onto the skin daily as needed (for back pain). Remove & Discard patch within 12 hours or as directed by MD      . MELATONIN EXTRA STRENGTH PO Take 15 mLs by mouth at bedtime as needed (sleep).       . memantine (NAMENDA) 10 MG tablet Take 10 mg by mouth 2 (two) times daily.      . metroNIDAZOLE (FLAGYL) 500 MG tablet Take 1 tablet (500 mg total) by mouth 3 (three) times daily.  21 tablet  0  . Multiple Vitamin (MULTIVITAMIN WITH MINERALS) TABS Take 1 tablet by mouth daily.      . Multiple Vitamins-Minerals (ICAPS AREDS FORMULA PO) Take 2 tablets by mouth 2 (two) times daily.      . pantoprazole (PROTONIX) 40 MG tablet Take 1 tablet (40 mg total) by mouth daily. Switch for any other PPI at similar dose and frequency  30  tablet  3  . polyethylene glycol (MIRALAX / GLYCOLAX) packet Take 17 g by mouth daily.      . Probiotic Product (PROBIOTIC PO) Take 1 capsule by mouth daily. Ultra Flora       No current facility-administered medications for this visit.    Past Medical History  Diagnosis Date  . IBS (irritable bowel syndrome)   . Diverticulosis   . Cataract   . OAB (overactive bladder)   . Macular degeneration   . Alzheimer disease   . Hypertension   . Arthritis   . Osteoporosis   . Parkinsonian syndrome   . Syncope, vasovagal   . Neuromuscular disorder     parkisonian syndrome  . 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 Unspecified transient cerebral ischemia   . Unspecified urinary incontinence   . Senile dementia, uncomplicated   . Unspecified constipation   . Hypothyroidism   . Anemia, unspecified   . Depression   . Alzheimer's disease   . Gout, unspecified   . Thoracic or lumbosacral neuritis or radiculitis, unspecified   . Progressive supranuclear palsy   . Unspecified transient  cerebral ischemia   . Unspecified urinary incontinence   . Syncope and collapse   . Paralysis agitans   . Unspecified transient cerebral ischemia   . Pneumonitis due to inhalation of food or vomitus   . Cellulitis and abscess of oral soft tissues   . Screening for lipoid disorders   . Senile dementia, uncomplicated   . Unspecified constipation   . Disorder of bone and cartilage, unspecified   . Other specified acquired hypothyroidism   . Unspecified hypothyroidism   . Anemia, unspecified   . Depressive disorder, not elsewhere classified   . Alzheimer's disease   . Paralysis agitans   . Pneumonia, organism unspecified   . Pseudomonas infection in conditions classified elsewhere and of unspecified site   . Unspecified essential hypertension   . Urinary tract infection, site not specified   . Osteoarthrosis, unspecified whether generalized or localized, unspecified site   . Osteoporosis, unspecified   . Gout, unspecified   . Thoracic or lumbosacral neuritis or radiculitis, unspecified   . Stroke     Past Surgical History  Procedure Laterality Date  . Esophagogastroduodenoscopy    . Eye surgery  2006    LEFT CATARACT    Family History  Problem Relation Age of Onset  . Cancer Mother   . Stroke Father   . Liver disease Father   . Kidney disease Brother   . Liver disease Brother   . Emphysema Brother     History   Social History  . Marital Status: Single    Spouse Name: N/A    Number of Children: N/A  . Years of Education: N/A   Occupational History  . Not on file.   Social History Main Topics  . Smoking status: Former Games developer  . Smokeless tobacco: Never Used     Comment: QUIT SMOKING BACK IN THE LATE 50'S  . Alcohol Use: No  . Drug Use: No  . Sexual Activity: No   Other Topics Concern  . Not on file   Social History Narrative  . No narrative on file    Review of Systems:  All systems reviewed.  They are negative to the above problem except as previously  stated.  Vital Signs: BP 135/75  Pulse 71  Ht 5\' 10"  (1.778 m)  Wt 170 lb (77.111 kg)  BMI  24.39 kg/m2  Physical Exam Patient in NAD  Examined in wheelchair.  Answered questions appropriately   HEENT:  Normocephalic, atraumatic  Neck: JVP is normal.    Lungs: clear to auscultation. No rales no wheezes.  Heart: Regular rate and rhythm. Normal S1, S2. No S3.   No significant murmurs. PMI not displaced.  Abdomen:  Supple, nontender. Normal bowel sounds. No masses. No hepatomegaly.  Extremities:   Good distal pulses throughout. Tr lower extremity edema.  Musculoskeletal :moving all extremities.     Assessment and Plan:  1.  Syncope  Continues to have intermitt spells though not that frequent.  I would not push BP too low  2.  CP  Atypical.  I doubt cardiac.    3.  HTN  Follow  4.  Diarrhea  Son reports that it has continued even after flagyl.  WIll rx with po vancomycin    F/U in late spring, sooner depending on symptoms

## 2013-11-02 ENCOUNTER — Telehealth: Payer: Self-pay | Admitting: Internal Medicine

## 2013-11-02 NOTE — Telephone Encounter (Signed)
New problem   Pt has difficulty swallowing pharm. recommend liquid spansion.  Tablets can not be crushed or emptied out.   Pt is on VANCOMYCIN cap.  Call in med to [facility] [address].

## 2013-11-02 NOTE — Telephone Encounter (Signed)
Spoke with pharm, script called in fore liquid vancomycin 50mg /ml 2.70ml qid. Son made aware.

## 2013-11-03 ENCOUNTER — Encounter: Payer: Self-pay | Admitting: Nurse Practitioner

## 2013-11-03 ENCOUNTER — Ambulatory Visit (INDEPENDENT_AMBULATORY_CARE_PROVIDER_SITE_OTHER): Payer: Medicare HMO | Admitting: Nurse Practitioner

## 2013-11-03 VITALS — BP 122/80 | HR 67 | Temp 97.4°F

## 2013-11-03 DIAGNOSIS — A0472 Enterocolitis due to Clostridium difficile, not specified as recurrent: Secondary | ICD-10-CM

## 2013-11-03 DIAGNOSIS — R319 Hematuria, unspecified: Secondary | ICD-10-CM

## 2013-11-03 DIAGNOSIS — F0280 Dementia in other diseases classified elsewhere without behavioral disturbance: Secondary | ICD-10-CM

## 2013-11-03 DIAGNOSIS — K219 Gastro-esophageal reflux disease without esophagitis: Secondary | ICD-10-CM

## 2013-11-03 DIAGNOSIS — R55 Syncope and collapse: Secondary | ICD-10-CM

## 2013-11-03 NOTE — Progress Notes (Signed)
Patient ID: Ricky [name], male   DOB: 04-25-1917, 79 y.o.   MRN: [id number]    Allergies  Allergen Reactions  . Other Other (See Comments)    Dairy products- cause swallowing difficulty     Chief Complaint  Patient presents with  . Hospitalization Follow-up    hospital f/u chest pain  . other    needs CBC, CMP today    HPI: Patient is a 79 y.o. male seen in the office today for hospital follow up  Pt with history of dementia, bed bound with chronic lower extremity contractures, hypertension, Parkinson syndrome, vasovagal syncope, GERD, low vitamin B 12 levels, dyslipidemia who had went to the dentist due to teeth falling out and subsequently developed some lightheadedness and chest discomfort and was brought to the ER. In the ER he was conscious and at his baseline in terms of mentation however he did complain of some atypical chest pain, his EKG did not show any acute changes and he ruled out for MI with serial negative troponin. No further symptoms once there and there was no additional cardiac work-up so he was discharged home on home medications with the addition of Protonix as patient's chest pain was relieved with GI cocktail.- this was on 10/18/13  Then 2 days later he had gross hematuria and he went back to the ED, he was tested positives for C diff, stools have improved on flagyl however not totally resolved therefore he was placed on vanc by cardiology on 12/15 for 10 days   No additional fevers or chills, eating and drinking better, diarrhea has improved,      Review of Systems:  Unable to obtain due to dementia; son provides all history and ROS Review of Systems  Constitutional: Negative for fever, chills and malaise/fatigue.  Respiratory: Negative for cough and shortness of breath.   Cardiovascular: Negative for chest pain and palpitations.  Gastrointestinal: Negative for nausea, vomiting, abdominal pain, diarrhea, constipation and blood in stool.  Genitourinary:   Chronic foley; no blood in urine since new catheter   Musculoskeletal: Negative for joint pain and myalgias.  Psychiatric/Behavioral: Positive for memory loss. The patient has insomnia.      Past Medical History  Diagnosis Date  . IBS (irritable bowel syndrome)   . Diverticulosis   . Cataract   . OAB (overactive bladder)   . Macular degeneration   . Alzheimer disease   . Hypertension   . Arthritis   . Osteoporosis   . Parkinsonian syndrome   . Syncope, vasovagal   . Neuromuscular disorder     parkisonian syndrome  . 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 Unspecified transient cerebral ischemia   . Unspecified urinary incontinence   . Senile dementia, uncomplicated   . Unspecified constipation   . Hypothyroidism   . Anemia, unspecified   . Depression   . Alzheimer's disease   . Gout, unspecified   . Thoracic or lumbosacral neuritis or radiculitis, unspecified   . Progressive supranuclear palsy   . Unspecified transient cerebral ischemia   . Unspecified urinary incontinence   . Syncope and collapse   . Paralysis agitans   . Unspecified transient cerebral ischemia   . Pneumonitis due to inhalation of food or vomitus   . Cellulitis and abscess of oral soft tissues   . Screening for lipoid disorders   . Senile dementia, uncomplicated   . Unspecified constipation   . Disorder of bone and cartilage, unspecified   .  Other specified acquired hypothyroidism   . Unspecified hypothyroidism   . Anemia, unspecified   . Depressive disorder, not elsewhere classified   . Alzheimer's disease   . Paralysis agitans   . Pneumonia, organism unspecified   . Pseudomonas infection in conditions classified elsewhere and of unspecified site   . Unspecified essential hypertension   . Urinary tract infection, site not specified   . Osteoarthrosis, unspecified whether generalized or localized, unspecified site   . Osteoporosis, unspecified   . Gout,  unspecified   . Thoracic or lumbosacral neuritis or radiculitis, unspecified   . Stroke    Past Surgical History  Procedure Laterality Date  . Esophagogastroduodenoscopy    . Eye surgery  2006    LEFT CATARACT   Social History:   reports that he has quit smoking. He has never used smokeless tobacco. He reports that he does not drink alcohol or use illicit drugs.  Family History  Problem Relation Age of Onset  . Cancer Mother   . Stroke Father   . Liver disease Father   . Kidney disease Brother   . Liver disease Brother   . Emphysema Brother     Medications: Patient's Medications  New Prescriptions   No medications on file  Previous Medications   ACETAMINOPHEN (TYLENOL) 500 MG TABLET    Take 500 mg by mouth 3 (three) times daily.    ASPIRIN EC 325 MG EC TABLET    Take 1 tablet (325 mg total) by mouth daily.   ATORVASTATIN (LIPITOR) 10 MG TABLET    Take 1 tablet (10 mg total) by mouth daily at 6 PM.   BACITRACIN OINTMENT    Apply 1 application topically 2 (two) times daily. Applied to tip of penis for skin tear.   BIFIDOBACTERIUM INFANTIS (ALIGN) CAPSULE    TAKE 1 TABLET BY MY MOUTH TWICE DAILY FOR 14 DAYS.   CALCIUM CARBONATE-VIT D-MIN (CALTRATE 600+D PLUS) 600-800 MG-UNIT CHEW    Chew 1 tablet by mouth 2 (two) times daily with a meal.    CRANBERRY PO    Take 4,200 mg by mouth 3 (three) times daily.   CYANOCOBALAMIN (VITAMIN B-12) 2500 MCG SUBL    Place 2,500 mcg under the tongue 2 (two) times daily.   D-MANNOSE POWD    Take 2,000 mg by mouth 2 (two) times daily.   FERROUS SULFATE 325 (65 FE) MG TABLET    Take 325 mg by mouth every other day.    GUAIFENESIN (MUCINEX) 600 MG 12 HR TABLET    Take 1,200 mg by mouth 2 (two) times daily as needed for congestion.    HYDRALAZINE (APRESOLINE) 25 MG TABLET    Take 0.5 tablets (12.5 mg total) by mouth 2 (two) times daily.   LACTOSE FREE NUTRITION (BOOST) LIQD    Take 90 mLs by mouth 2 (two) times daily between meals.    LEVOTHYROXINE  (SYNTHROID, LEVOTHROID) 25 MCG TABLET    Take 25 mcg by mouth daily before breakfast.   LIDOCAINE (LIDODERM) 5 %    Place 1 patch onto the skin daily as needed (for back pain). Remove & Discard patch within 12 hours or as directed by MD   MELATONIN EXTRA STRENGTH PO    Take 15 mLs by mouth at bedtime as needed (sleep).    MEMANTINE (NAMENDA) 10 MG TABLET    Take 10 mg by mouth 2 (two) times daily.   METRONIDAZOLE (FLAGYL) 500 MG TABLET    Take 1 tablet (500  mg total) by mouth 3 (three) times daily.   MULTIPLE VITAMIN (MULTIVITAMIN WITH MINERALS) TABS    Take 1 tablet by mouth daily.   MULTIPLE VITAMINS-MINERALS (ICAPS AREDS FORMULA PO)    Take 2 tablets by mouth 2 (two) times daily.   PANTOPRAZOLE (PROTONIX) 40 MG TABLET    Take 1 tablet (40 mg total) by mouth daily. Switch for any other PPI at similar dose and frequency   POLYETHYLENE GLYCOL (MIRALAX / GLYCOLAX) PACKET    Take 17 g by mouth daily.   PROBIOTIC PRODUCT (ALIGN PO)    Take 1 tablet by mouth 2 (two) times daily.   VANCOMYCIN (VANCOCIN) 125 MG CAPSULE    Take 1 capsule (125 mg total) by mouth 4 (four) times daily.  Modified Medications   No medications on file  Discontinued Medications   PROBIOTIC PRODUCT (PROBIOTIC PO)    Take 1 capsule by mouth daily. Ultra Flora     Physical Exam:  Filed Vitals:   11/03/13 0816  BP: 122/80  Pulse: 67  Temp: 97.4 F (36.3 C)  TempSrc: Oral  SpO2: 98%    Physical Exam  Constitutional: No distress.  HENT:  Mouth/Throat: Oropharynx is clear and moist. No oropharyngeal exudate.  Neck: Normal range of motion. Neck supple. No thyromegaly present.  Cardiovascular: Normal rate, regular rhythm and normal heart sounds.   Pulmonary/Chest: Effort normal and breath sounds normal. No respiratory distress.  Abdominal: Soft. Bowel sounds are normal. He exhibits no distension.  Musculoskeletal: He exhibits no edema and no tenderness.  In [facility]  Neurological: He is alert.  Skin: Skin is warm and  dry. He is not diaphoretic.     Labs reviewed: Basic Metabolic Panel:  Recent Labs  16/10/96 1542  02/11/13 1722  08/29/13 0955 09/09/13 0735 10/18/13 1036 10/18/13 1820 10/20/13 2231  NA  --   < > 134*  < > 139 138 142  --  135  K  --   < > 4.0  < > 4.2 4.1 4.7  --  3.7  CL  --   < > 99  < > 97 101 104  --  99  CO2  --   < > 26  < > 19 25 25   --  26  GLUCOSE  --   < > 100*  < > 82 92 87  --  129*  BUN  --   < > 9  < > 6* 6 7  --  8  CREATININE 0.85  < > 0.81  < > 0.77 0.73 0.75 0.72 0.80  CALCIUM  --   < > 9.1  < > 9.3 9.1 9.8  --  8.8  MG 2.0  --  2.2  --   --   --   --   --   --   PHOS  --   --  2.8  --   --   --   --   --   --   TSH 0.748  --   --   --  1.180  --   --   --   --   < > = values in this interval not displayed. Liver Function Tests:  Recent Labs  05/18/13 0500 09/09/13 0735 10/18/13 1036  AST 22 20 24   ALT 12 11 15   ALKPHOS 69 113 108  BILITOT 0.4 0.5 0.5  PROT 7.4 7.9 7.8  ALBUMIN 3.0* 3.5 3.7   No results found for this basename:  LIPASE, AMYLASE,  in the last 8760 hours No results found for this basename: AMMONIA,  in the last 8760 hours CBC:  Recent Labs  09/09/13 0735 10/18/13 1036 10/18/13 1820 10/20/13 1556  WBC 4.8 6.6 4.8 6.2  NEUTROABS 2.9 5.1  --  4.1  HGB 11.1* 11.6* 11.6* 11.1*  HCT 33.3* 36.1* 35.2* 33.6*  MCV 86.0 88.7 87.8 86.8  PLT 220 PLATELET CLUMPS NOTED ON SMEAR, COUNT APPEARS ADEQUATE 178 163   Lipid Panel: No results found for this basename: CHOL, HDL, LDLCALC, TRIG, CHOLHDL, LDLDIRECT,  in the last 8760 hours TSH:  Recent Labs  01/27/13 1542 08/29/13 0955  TSH 0.748 1.180    Assessment/Plan 1. Syncope -stable; no further episodes since hospitalization  2. ALZHEIMERS DISEASE Advanced; son reports has good days with good conversations conts on namenda   3. GERD (gastroesophageal reflux disease) -son questions if pt really has this; pt unable to give information -had chest pain and was placed on  protonix in hospital after MI was ruled out -will cont to protonix at this time to see if there is any effects   4. Enteritis due to Clostridium difficile -conts on vanc and probiotics - CBC With differential/Platelet - Basic metabolic panel  5. Hematuria -conts with foley -no further blood in urine will follow up blood count - CBC With differential/Platelet  To keep routine follow up

## 2013-11-04 LAB — CBC WITH DIFFERENTIAL
Basophils Absolute: 0 10*3/uL (ref 0.0–0.2)
Basos: 0 %
Eosinophils Absolute: 0.1 10*3/uL (ref 0.0–0.4)
HCT: 34.1 % — ABNORMAL LOW (ref 37.5–51.0)
Immature Granulocytes: 0 %
Lymphocytes Absolute: 1.4 10*3/uL (ref 0.7–3.1)
MCH: 28 pg (ref 26.6–33.0)
MCHC: 32 g/dL (ref 31.5–35.7)
MCV: 88 fL (ref 79–97)
Monocytes Absolute: 0.5 10*3/uL (ref 0.1–0.9)
Monocytes: 8 %
Platelets: 305 10*3/uL (ref 150–379)
RDW: 15.1 % (ref 12.3–15.4)

## 2013-11-04 LAB — BASIC METABOLIC PANEL
BUN/Creatinine Ratio: 8 — ABNORMAL LOW (ref 10–22)
BUN: 7 mg/dL — ABNORMAL LOW (ref 10–36)
CO2: 20 mmol/L (ref 18–29)
Chloride: 98 mmol/L (ref 97–108)
Creatinine, Ser: 0.85 mg/dL (ref 0.76–1.27)
GFR calc Af Amer: 86 mL/min/{1.73_m2} (ref >59)
GFR calc non Af Amer: 75 mL/min/{1.73_m2} (ref >59)
Sodium: 139 mmol/L (ref 134–144)

## 2013-11-07 ENCOUNTER — Telehealth: Payer: Self-pay | Admitting: Internal Medicine

## 2013-11-07 NOTE — Telephone Encounter (Signed)
Left message for pts son, per dr Tenny Craw the pt will need to see his primary care.

## 2013-11-07 NOTE — Telephone Encounter (Signed)
Spoke with pt son, the pt had to switch from the capsules to the liquid. He seemed to be making progress on the pills but since switching to the liquid the diarrhea has gotten bad again. He questioned the dosage and if we need to do a stool sample. Will discuss with dr Tenny Craw

## 2013-11-07 NOTE — Telephone Encounter (Signed)
New problem'    Pt's son called about pt's med VANCOMYCIN it is not working.  He just wanted to know if pt should [place] a higher dosage?  If any questions please give son a call.

## 2013-11-20 ENCOUNTER — Other Ambulatory Visit: Payer: Self-pay | Admitting: Internal Medicine

## 2013-11-26 ENCOUNTER — Encounter: Payer: Self-pay | Admitting: Internal Medicine

## 2013-11-28 ENCOUNTER — Encounter: Payer: Self-pay | Admitting: Internal Medicine

## 2013-11-28 ENCOUNTER — Ambulatory Visit (INDEPENDENT_AMBULATORY_CARE_PROVIDER_SITE_OTHER): Payer: Medicare HMO | Admitting: Internal Medicine

## 2013-11-28 VITALS — BP 130/84 | HR 56 | Temp 97.1°F

## 2013-11-28 DIAGNOSIS — G231 Progressive supranuclear ophthalmoplegia [Steele-Richardson-Olszewski]: Secondary | ICD-10-CM

## 2013-11-28 DIAGNOSIS — R339 Retention of urine, unspecified: Secondary | ICD-10-CM

## 2013-11-28 DIAGNOSIS — I872 Venous insufficiency (chronic) (peripheral): Secondary | ICD-10-CM

## 2013-11-28 DIAGNOSIS — A0472 Enterocolitis due to Clostridium difficile, not specified as recurrent: Secondary | ICD-10-CM

## 2013-11-28 DIAGNOSIS — G238 Other specified degenerative diseases of basal ganglia: Secondary | ICD-10-CM

## 2013-11-28 NOTE — Progress Notes (Signed)
Patient ID: Ricky [name], male   DOB: December 03, 1916, 80 y.o.   MRN: [id number]   Location:  [facility] [place] [facility]  Code Status: full code--reviewed today, but pt does not want to be kept on life support  Allergies  Allergen Reactions  . Other Other (See Comments)    Dairy products- cause swallowing difficulty    Chief Complaint  Patient presents with  . Medical Managment of Chronic Issues    3 month f/u with labs printed   HPI: Patient is a 80 y.o. black male seen in the office today for medical mgt of chronic diseases.  He has been hospitalized several times since his last visit for utis and delirium.  Two hospitalization in dec 2 and 4.  Syncope 12/2 and returned to normal w/in a few hours.  Complained getting out of bed in the morning he also had chest pain--observed overnight.  Went to cardiology on 12/15, but not cardiac.  BP had been a little low this am, but ok here.  His catheter had been changed to get a clean urine sample during the 12/2 stay also.  Catheter was wrong size and he developed gross hematuria 12/4.  Went to ED--felt nothing could be done, but had behavioral changes and bag had dark cranberry color--had low grade temp.  Took stool sample and had cdiff.  Had been on cipro, keflex, ampicillin.  Took 10 days of flagyl for cdiff, but did not improve so cardiologist switched him to po vanc pills, then needed liquid until 12/26.  Still foul smell, diarrhea, yellow with mucus x 1 wk.  1/3-4, stool sample negative.  Improvement in stool color and consistency of mashed potatoes.  Still bad smell.  Taken sample this am.  Appearance no longer suggestive, just smell.   Discussed colonization.  Did very well until yesterday for 4 days. Did not sleep well past two nights.  Had been talking more, went out for shoes for 4 hours.  Walked well yesterday until after supper time.    10 days ago, pocket of yellowish "pus-like" material appeared on his right  lateral foot beneath the lateral malleolus.  Could have been a blister.  Also had swelling of the leg and foot.  Decreased in size--that area turned black.  Skin is scaly.  Was briefly painful for a day and a half.  Moves his legs around in his sleep frequently.  Nupro was started for parkinson's experimentally son says, but then stopped when Dr. Rexene Alberts came.    Review of Systems:  Review of Systems  Constitutional: Positive for malaise/fatigue. Negative for fever and chills.  HENT: Negative for congestion.   Eyes:       Ophthalmoplegia  Respiratory: Negative for cough and shortness of breath.   Cardiovascular: Positive for leg swelling. Negative for chest pain and palpitations.       Recently  Gastrointestinal: Negative for heartburn and abdominal pain.  Genitourinary: Negative for dysuria.       Has chronic indwelling catheter   Musculoskeletal: Negative for falls and myalgias.  Skin: Negative for rash.       See hpi re: skin on right foot  Neurological: Positive for loss of consciousness and weakness. Negative for headaches.       Has progressive supranuclear palsy (parkinson's variation)  Psychiatric/Behavioral: Positive for memory loss.    Past Medical History  Diagnosis Date  . IBS (irritable bowel syndrome)   . Diverticulosis   . Cataract   .  OAB (overactive bladder)   . Macular degeneration   . Alzheimer disease   . Hypertension   . Arthritis   . Osteoporosis   . Parkinsonian syndrome   . Syncope, vasovagal   . Neuromuscular disorder     parkisonian syndrome  . 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 Unspecified transient cerebral ischemia   . Unspecified urinary incontinence   . Senile dementia, uncomplicated   . Unspecified constipation   . Hypothyroidism   . Anemia, unspecified   . Depression   . Alzheimer's disease   . Gout, unspecified   . Thoracic or lumbosacral neuritis or radiculitis, unspecified   . Progressive  supranuclear palsy   . Unspecified transient cerebral ischemia   . Unspecified urinary incontinence   . Syncope and collapse   . Paralysis agitans   . Unspecified transient cerebral ischemia   . Pneumonitis due to inhalation of food or vomitus   . Cellulitis and abscess of oral soft tissues   . Screening for lipoid disorders   . Senile dementia, uncomplicated   . Unspecified constipation   . Disorder of bone and cartilage, unspecified   . Other specified acquired hypothyroidism   . Unspecified hypothyroidism   . Anemia, unspecified   . Depressive disorder, not elsewhere classified   . Alzheimer's disease   . Paralysis agitans   . Pneumonia, organism unspecified   . Pseudomonas infection in conditions classified elsewhere and of unspecified site   . Unspecified essential hypertension   . Urinary tract infection, site not specified   . Osteoarthrosis, unspecified whether generalized or localized, unspecified site   . Osteoporosis, unspecified   . Gout, unspecified   . Thoracic or lumbosacral neuritis or radiculitis, unspecified   . Stroke     Past Surgical History  Procedure Laterality Date  . Esophagogastroduodenoscopy    . Eye surgery  2006    LEFT CATARACT    Social History:   reports that he has quit smoking. He has never used smokeless tobacco. He reports that he does not drink alcohol or use illicit drugs.  Family History  Problem Relation Age of Onset  . Cancer Mother   . Stroke Father   . Liver disease Father   . Kidney disease Brother   . Liver disease Brother   . Emphysema Brother     Medications: Patient's Medications  New Prescriptions   No medications on file  Previous Medications   ACETAMINOPHEN (TYLENOL) 500 MG TABLET    Take 500 mg by mouth 3 (three) times daily.    ASPIRIN EC 325 MG EC TABLET    Take 1 tablet (325 mg total) by mouth daily.   ATORVASTATIN (LIPITOR) 10 MG TABLET    Take 1 tablet (10 mg total) by mouth daily at 6 PM.   BACITRACIN  OINTMENT    Apply 1 application topically 2 (two) times daily. Applied to tip of penis for skin tear.   BIFIDOBACTERIUM INFANTIS (ALIGN) CAPSULE    TAKE ONE CAPSULE BY MOUTH TWICE A DAY FOR 14 DAYS   CALCIUM CARBONATE-VIT D-MIN (CALTRATE 600+D PLUS) 600-800 MG-UNIT CHEW    Chew 1 tablet by mouth 2 (two) times daily with a meal.    CRANBERRY PO    Take 4,200 mg by mouth 3 (three) times daily.   CYANOCOBALAMIN (VITAMIN B-12) 2500 MCG SUBL    Place 2,500 mcg under the tongue 2 (two) times daily.   D-MANNOSE POWD    Take 2,000  mg by mouth 2 (two) times daily.   FERROUS SULFATE 325 (65 FE) MG TABLET    Take 325 mg by mouth every other day.    GUAIFENESIN (MUCINEX) 600 MG 12 HR TABLET    Take 1,200 mg by mouth 2 (two) times daily as needed for congestion.    HYDRALAZINE (APRESOLINE) 25 MG TABLET    Take 0.5 tablets (12.5 mg total) by mouth 2 (two) times daily.   LACTOSE FREE NUTRITION (BOOST) LIQD    Take 90 mLs by mouth 2 (two) times daily between meals.    LEVOTHYROXINE (SYNTHROID, LEVOTHROID) 25 MCG TABLET    Take 25 mcg by mouth daily before breakfast.   LIDOCAINE (LIDODERM) 5 %    Place 1 patch onto the skin daily as needed (for back pain). Remove & Discard patch within 12 hours or as directed by MD   MELATONIN EXTRA STRENGTH PO    Take 15 mLs by mouth at bedtime as needed (sleep).    MEMANTINE (NAMENDA) 10 MG TABLET    Take 10 mg by mouth 2 (two) times daily.   MULTIPLE VITAMIN (MULTIVITAMIN WITH MINERALS) TABS    Take 1 tablet by mouth daily.   MULTIPLE VITAMINS-MINERALS (ICAPS AREDS FORMULA PO)    Take 2 tablets by mouth 2 (two) times daily.   PANTOPRAZOLE (PROTONIX) 40 MG TABLET    Take 1 tablet (40 mg total) by mouth daily. Switch for any other PPI at similar dose and frequency   POLYETHYLENE GLYCOL (MIRALAX / GLYCOLAX) PACKET    Take 17 g by mouth daily.  Modified Medications   No medications on file  Discontinued Medications   METRONIDAZOLE (FLAGYL) 500 MG TABLET    Take 1 tablet (500 mg  total) by mouth 3 (three) times daily.   PROBIOTIC PRODUCT (ALIGN PO)    Take 1 tablet by mouth 2 (two) times daily.   VANCOMYCIN (VANCOCIN) 125 MG CAPSULE    Take 1 capsule (125 mg total) by mouth 4 (four) times daily.     Physical Exam: Filed Vitals:   11/28/13 0853  BP: 130/84  Pulse: 56  Temp: 97.1 F (36.2 C)  TempSrc: Oral  SpO2: 91%  Physical Exam  Constitutional: No distress.  Chronically ill appearing male with stooped posture, drowsy in wheelchair  Cardiovascular: Normal rate, regular rhythm, normal heart sounds and intact distal pulses.   Pulmonary/Chest: Effort normal and breath sounds normal. No respiratory distress.  Abdominal: Soft. Bowel sounds are normal.  Genitourinary:  Has catheter with dark yellow urine in leg bag on right leg  Musculoskeletal:  Stooped posture (kyphosis)  Neurological: He displays abnormal reflex. He exhibits abnormal muscle tone. Coordination abnormal.  Easily arousable, but drowsy  Skin: Skin is warm and dry.  Just beneath right lateral malleolus has dry area with brown discoloration at the superior aspect, yellow at inferior aspect, no fluid present any longer, also has chronic venous stasis brownish discoloration up through calves bilaterally  Psychiatric:  Flat affect     Labs reviewed: Basic Metabolic Panel:  Recent Labs  01/27/13 1542  02/11/13 1722  08/29/13 0955  10/18/13 1036 10/18/13 1820 10/20/13 2231 11/03/13 0915  NA  --   < > 134*  < > 139  < > 142  --  135 139  K  --   < > 4.0  < > 4.2  < > 4.7  --  3.7 4.5  CL  --   < > 99  < >  97  < > 104  --  99 98  CO2  --   < > 26  < > 19  < > 25  --  26 20  GLUCOSE  --   < > 100*  < > 82  < > 87  --  129* 77  BUN  --   < > 9  < > 6*  < > 7  --  8 7*  CREATININE 0.85  < > 0.81  < > 0.77  < > 0.75 0.72 0.80 0.85  CALCIUM  --   < > 9.1  < > 9.3  < > 9.8  --  8.8 9.3  MG 2.0  --  2.2  --   --   --   --   --   --   --   PHOS  --   --  2.8  --   --   --   --   --   --   --     TSH 0.748  --   --   --  1.180  --   --   --   --   --   < > = values in this interval not displayed. Liver Function Tests:  Recent Labs  05/18/13 0500 09/09/13 0735 10/18/13 1036  AST 22 20 24   ALT 12 11 15   ALKPHOS 69 113 108  BILITOT 0.4 0.5 0.5  PROT 7.4 7.9 7.8  ALBUMIN 3.0* 3.5 3.7  CBC:  Recent Labs  10/18/13 1036 10/18/13 1820 10/20/13 1556 11/03/13 0915  WBC 6.6 4.8 6.2 5.6  NEUTROABS 5.1  --  4.1 3.6  HGB 11.6* 11.6* 11.1* 10.9*  HCT 36.1* 35.2* 33.6* 34.1*  MCV 88.7 87.8 86.8 88  PLT PLATELET CLUMPS NOTED ON SMEAR, COUNT APPEARS ADEQUATE 178 163 305   Lab Results  Component Value Date   HGBA1C 5.3 06/21/2012   Assessment/Plan 1. Enteritis due to Clostridium difficile - C. difficile, PCR -suspect he has colonization at this time as he only has a foul smell to the stool--no longer loose or yellow and pt has recuperated clinically--had been more alert (sleepy after poor sleep last night today, but good for 5 days) 2. Progressive supranuclear ophthalmoplegia or palsy -follows with Dr. Rexene Alberts at [facility] -has fluctuating mentation some of which I believe is part of his disease process -reviewed goals of care again today--seems pt has agreed to dnr status when discussed with him during a hospitalization, but family has been opting for full code during his other hospitalizations--I emphasized the importance of discussing this with him at home especially since he has a living will stating he would not want to be kept alive on machines -asked family to provide his living will and hcpoa paperwork for the office 3. Venous insufficiency (chronic) (peripheral) -especially of right leg--had blister beneath the lateral malleolus, but has resolved now--explained how this was fluid not infection that had third spaced to his protein deficiency and dysfunction of veins 4. Urinary retention with incomplete bladder emptying -has chronic catheter, follows with  urology -has frequent "UTIs"--likely colonization due to his neurogenic bladder--I have explained this to his son on previous visits--hydration encouraged to prevent true infection Labs/tests ordered:  cdiff pcr per family request Next appt:  3 mos

## 2013-11-29 LAB — CLOSTRIDIUM DIFFICILE BY PCR: Toxigenic C. Difficile by PCR: POSITIVE — AB

## 2013-12-04 ENCOUNTER — Other Ambulatory Visit: Payer: Self-pay | Admitting: Internal Medicine

## 2013-12-05 ENCOUNTER — Telehealth: Payer: Self-pay | Admitting: *Deleted

## 2013-12-05 DIAGNOSIS — G238 Other specified degenerative diseases of basal ganglia: Secondary | ICD-10-CM

## 2013-12-05 DIAGNOSIS — Z466 Encounter for fitting and adjustment of urinary device: Secondary | ICD-10-CM

## 2013-12-05 DIAGNOSIS — R339 Retention of urine, unspecified: Secondary | ICD-10-CM

## 2013-12-05 DIAGNOSIS — I10 Essential (primary) hypertension: Secondary | ICD-10-CM

## 2013-12-05 MED ORDER — SACCHAROMYCES BOULARDII 250 MG PO CAPS: 250.0000 mg | ORAL_CAPSULE | Freq: Two times a day (BID) | ORAL | Status: DC

## 2013-12-05 NOTE — Telephone Encounter (Signed)
Spoke to pt's son, [name] regarding pt's stool samples.  Results given and RX for Florastor sent to the pharmacy. Aware/agreeable.

## 2013-12-06 ENCOUNTER — Other Ambulatory Visit: Payer: Self-pay | Admitting: Internal Medicine

## 2013-12-07 ENCOUNTER — Encounter: Payer: Self-pay | Admitting: Internal Medicine

## 2013-12-09 ENCOUNTER — Encounter (HOSPITAL_BASED_OUTPATIENT_CLINIC_OR_DEPARTMENT_OTHER): Payer: Medicare HMO | Attending: General Surgery

## 2014-01-03 ENCOUNTER — Ambulatory Visit: Payer: Medicare HMO | Admitting: Neurology

## 2014-01-04 ENCOUNTER — Encounter: Payer: Self-pay | Admitting: Neurology

## 2014-01-04 ENCOUNTER — Other Ambulatory Visit: Payer: Self-pay | Admitting: *Deleted

## 2014-01-04 ENCOUNTER — Telehealth: Payer: Self-pay | Admitting: Neurology

## 2014-01-04 ENCOUNTER — Telehealth: Payer: Self-pay | Admitting: *Deleted

## 2014-01-04 ENCOUNTER — Encounter (INDEPENDENT_AMBULATORY_CARE_PROVIDER_SITE_OTHER): Payer: Self-pay

## 2014-01-04 ENCOUNTER — Ambulatory Visit (INDEPENDENT_AMBULATORY_CARE_PROVIDER_SITE_OTHER): Payer: Medicare HMO | Admitting: Neurology

## 2014-01-04 VITALS — BP 151/75 | HR 54 | Temp 97.5°F

## 2014-01-04 DIAGNOSIS — R293 Abnormal posture: Secondary | ICD-10-CM

## 2014-01-04 DIAGNOSIS — A0471 Enterocolitis due to Clostridium difficile, recurrent: Secondary | ICD-10-CM

## 2014-01-04 DIAGNOSIS — H510 Palsy (spasm) of conjugate gaze: Secondary | ICD-10-CM

## 2014-01-04 DIAGNOSIS — Z9181 History of falling: Secondary | ICD-10-CM

## 2014-01-04 DIAGNOSIS — Z9889 Other specified postprocedural states: Secondary | ICD-10-CM

## 2014-01-04 DIAGNOSIS — R413 Other amnesia: Secondary | ICD-10-CM

## 2014-01-04 DIAGNOSIS — N390 Urinary tract infection, site not specified: Secondary | ICD-10-CM

## 2014-01-04 DIAGNOSIS — A0472 Enterocolitis due to Clostridium difficile, not specified as recurrent: Secondary | ICD-10-CM

## 2014-01-04 DIAGNOSIS — Z960 Presence of urogenital implants: Secondary | ICD-10-CM

## 2014-01-04 DIAGNOSIS — Z978 Presence of other specified devices: Secondary | ICD-10-CM

## 2014-01-04 DIAGNOSIS — R296 Repeated falls: Secondary | ICD-10-CM

## 2014-01-04 DIAGNOSIS — R269 Unspecified abnormalities of gait and mobility: Secondary | ICD-10-CM

## 2014-01-04 DIAGNOSIS — G231 Progressive supranuclear ophthalmoplegia [Steele-Richardson-Olszewski]: Secondary | ICD-10-CM

## 2014-01-04 DIAGNOSIS — G238 Other specified degenerative diseases of basal ganglia: Secondary | ICD-10-CM

## 2014-01-04 MED ORDER — ROTIGOTINE 2 MG/24HR TD PT24: 1.0000 | MEDICATED_PATCH | Freq: Every day | TRANSDERMAL | Status: DC

## 2014-01-04 NOTE — Telephone Encounter (Signed)
Pt's son called.  He stated that Dr. Rexene Alberts gave them samples of Neupro this morning.  He received 1 box 1 mg and 1 box of 2 mg and a written prescription.  He stated he told Dr. Rexene Alberts that he had quite a few boxes of samples at home from Dr. Erling Cruz.  However when they returned home he found that all the boxes they had had expired.  He is asking if it would be possible to get a box of the 1 mg samples left at the front desk for him to pick up?  Please call him and let him know.  Thank you

## 2014-01-04 NOTE — Patient Instructions (Addendum)
We will try physical therapy outpatient again.   We will try Neupro again: 1 mg/24 hour: Use 1 patch each night for 2 weeks, then go to the 2 mg patch daily thereafter. Common side effects reported are: Sedation, sleepiness, nausea, vomiting, and rare side effects are confusion, hallucinations, swelling in legs, and abnormal behaviors, including impulse control problems, which can manifest as excessive eating, obsessions with food or gambling, or hypersexuality.

## 2014-01-04 NOTE — Progress Notes (Signed)
Subjective:    Patient ID: Ricky [name] is a 80 y.o. male.  HPI    Interim history:   Ricky [name] is a very pleasant 80 year old right-handed gentleman with a complex underlying medical history of pneumonia, pulmonary embolism, UTIs, sycope, deconditioning, immobility, and memory loss, who presents for followup consultation of his parkinsonism, possible PSP. He is accompanied by his son, Ricky [name], again today. I last saw him 07/02/2013, at which time I did not start him on any medications. He had tried Sinemet, Neupro and Aricept in the past. I continued him on Namenda.  In the interim he was seen in the emergency room several times. On 07/12/2013 he presented to the ER after a syncopal spell. Workup included EKG and urinalysis. He did not need further admission or treatment. On 09/09/2013 he presented to the emergency room not feeling well and complaining of back pain. An x-ray of the lumbar spine showed an L1 anterior wedge compression fracture of unknown age. He did not need further treatment or admission. On 10/18/2013 he was admitted to the hospital for one day for chest pain. Workup showed no acute coronary event, but he was felt to have atypical chest pain from GERD. They started protonix, he saw cardiology afterwards. The protonix was stopped after about 6 weeks. He was taken back to the ER on 10/20/14 d/t hematuria and eventually this stopped after changing the foley. He also tested positive for C. Diff and treated with Flagyl, which did not help and on 10/31/13 he was placed on PO Vanc pills from 15th to 18th, then liquid for another 8 days, by Dr. Harrington Challenger, his cardiologist. He has had C. Diff 2 times before, per Ricky [name].   Today, Ricky [name] (who provides the entire Hx) reports, that patient had no been "right" since 9/14 off and on. He had elevated temperature off and on, up to 99.1, which is up from his baseline of around 97.1. In the last few weeks, he has been better, more closer to his baseline.  Ricky [name] walks him about 7 times a day with maximum assistance by holding both is hands. In 1/13, he was using a walker and was supposed to have PT, but was not able to follow commands at the time and in hindsight, he had a smoldering infection. He has overall become stiffer and slower. He is on cranberry juice and tablets, and D-Mannose prn for UTI. He seems to have small incidences with aspiration. He has lately been eating better. He had some teeth pulled. His liquids are thickened and his food is finely chopped. There is a nurse that comes in every day around 8:15.  Looking back, Ricky [name] reports that when patient was on Neupro patch he was a little bit better. Ricky [name] did not notice any side effects.  I first met him on 04/01/2013 at which time I felt that the constellation of parkinsonism, memory loss, gait dysfunction, balance issues, recurrent falls and syncopal spells as well as a history of recurrent urinary tract infections most likely pointed towards a diagnosis of PSP (progressive supranuclear palsy). He previously followed with Dr. Morene Antu and was last seen by him on 12/03/2012. He has a history of blackout spells for the past 5 years as well as memory loss. The first episode of loss of consciousness was noted in March 2010 at which time his son found him down on the floor rigid with his head flexed and his right hand trembling. He was admitted to the hospital for 3 days  and diagnosed with vasovagal syncope and had workup including chest x-ray (cardiomegaly), head CT without contrast (chronic small vessel disease), MRI brain without contrast (no acute abnormality and chronic small vessel disease), MRA head (normal), echocardiogram (EF of 60%). He had pneumonia, complicated by a PE in 7/68 and repeat MRI brain in 1/11 (generalized atrophy) and MRA head (mild stenosis of the L MCA). He was tried on Aricept (stopped d/t bradycardia) and Sinemet (discontinued). He has had recurrent pneumonia and recurrent  UTIs and had a Foley catheter placed. He was at [facility] in April 2013. He developed pressure ulcers on his feet. He has a CNA daily to help him bathe and dress and he has supervision 24-7. He was taken off of Neupro patch a hospitalization for UTI and his mental functioning improved.  He was admitted to the hospital with fever and altered mental status on 06/08/2013 and discharged on 06/17/2013. He was treated for a urinary tract infection with IV vancomycin and had an LP with CSF cultures negative. He has become WC bound.   His Past Medical History Is Significant For: Past Medical History  Diagnosis Date  . IBS (irritable bowel syndrome)   . Diverticulosis   . Cataract   . OAB (overactive bladder)   . Macular degeneration   . Alzheimer disease   . Hypertension   . Arthritis   . Osteoporosis   . Parkinsonian syndrome   . Syncope, vasovagal   . Neuromuscular disorder     parkisonian syndrome  . 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 Unspecified transient cerebral ischemia   . Unspecified urinary incontinence   . Senile dementia, uncomplicated   . Unspecified constipation   . Hypothyroidism   . Anemia, unspecified   . Depression   . Alzheimer's disease   . Gout, unspecified   . Thoracic or lumbosacral neuritis or radiculitis, unspecified   . Progressive supranuclear palsy   . Unspecified transient cerebral ischemia   . Unspecified urinary incontinence   . Syncope and collapse   . Paralysis agitans   . Unspecified transient cerebral ischemia   . Pneumonitis due to inhalation of food or vomitus   . Cellulitis and abscess of oral soft tissues   . Screening for lipoid disorders   . Senile dementia, uncomplicated   . Unspecified constipation   . Disorder of bone and cartilage, unspecified   . Other specified acquired hypothyroidism   . Unspecified hypothyroidism   . Anemia, unspecified   . Depressive disorder, not elsewhere classified    . Alzheimer's disease   . Paralysis agitans   . Pneumonia, organism unspecified   . Pseudomonas infection in conditions classified elsewhere and of unspecified site   . Unspecified essential hypertension   . Urinary tract infection, site not specified   . Osteoarthrosis, unspecified whether generalized or localized, unspecified site   . Osteoporosis, unspecified   . Gout, unspecified   . Thoracic or lumbosacral neuritis or radiculitis, unspecified   . Stroke     His Past Surgical History Is Significant For: Past Surgical History  Procedure Laterality Date  . Esophagogastroduodenoscopy    . Eye surgery  2006    LEFT CATARACT    His Family History Is Significant For: Family History  Problem Relation Age of Onset  . Cancer Mother   . Stroke Father   . Liver disease Father   . Kidney disease Brother   . Liver disease Brother   . Emphysema Brother  His Social History Is Significant For: History   Social History  . Marital Status: Single    Spouse Name: N/A    Number of Children: N/A  . Years of Education: N/A   Social History Main Topics  . Smoking status: Former Research scientist (life sciences)  . Smokeless tobacco: Never Used     Comment: QUIT SMOKING BACK IN THE LATE 50'S  . Alcohol Use: No  . Drug Use: No  . Sexual Activity: No   Other Topics Concern  . None   Social History Narrative  . None   His Allergies Are:  Allergies  Allergen Reactions  . Other Other (See Comments)    Dairy products- cause swallowing difficulty    His Current Medications Are:  Outpatient Encounter Prescriptions as of 01/04/2014  Medication Sig  . acetaminophen (TYLENOL) 500 MG tablet Take 500 mg by mouth 3 (three) times daily.   Marland Kitchen aspirin EC 325 MG EC tablet Take 1 tablet (325 mg total) by mouth daily.  Marland Kitchen atorvastatin (LIPITOR) 10 MG tablet Take 1 tablet (10 mg total) by mouth daily at 6 PM.  . bacitracin ointment Apply 1 application topically 2 (two) times daily. Applied to tip of penis for skin  tear.  . bifidobacterium infantis (ALIGN) capsule TAKE ONE CAPSULE BY MOUTH TWICE A DAY FOR 14 DAYS  . Calcium Carbonate-Vit D-Min (CALTRATE 600+D PLUS) 600-800 MG-UNIT CHEW Chew 1 tablet by mouth 2 (two) times daily with a meal.   . CRANBERRY PO Take 4,200 mg by mouth 3 (three) times daily.  . Cyanocobalamin (VITAMIN B-12) 2500 MCG SUBL Place 5,000 mcg under the tongue daily.   . D-Mannose POWD Take 2,000 mg by mouth 2 (two) times daily.  . ferrous sulfate 325 (65 FE) MG tablet Take 325 mg by mouth every other day.   Marland Kitchen guaiFENesin (MUCINEX) 600 MG 12 hr tablet Take 1,200 mg by mouth 2 (two) times daily as needed for congestion.   . hydrALAZINE (APRESOLINE) 25 MG tablet Take 0.5 tablets (12.5 mg total) by mouth 2 (two) times daily.  Marland Kitchen lactose free nutrition (BOOST) LIQD Take 90 mLs by mouth 2 (two) times daily between meals.   Marland Kitchen levothyroxine (SYNTHROID, LEVOTHROID) 25 MCG tablet Take 25 mcg by mouth daily before breakfast.  . lidocaine (LIDODERM) 5 % Place 1 patch onto the skin daily as needed (for back pain). Remove & Discard patch within 12 hours or as directed by MD  . MELATONIN EXTRA STRENGTH PO Take 15 mLs by mouth at bedtime as needed (sleep).   . memantine (NAMENDA) 10 MG tablet Take 10 mg by mouth 2 (two) times daily.  . Multiple Vitamin (MULTIVITAMIN WITH MINERALS) TABS Take 1 tablet by mouth daily.  . Multiple Vitamins-Minerals (ICAPS AREDS FORMULA PO) Take 2 tablets by mouth 2 (two) times daily.  Marland Kitchen NAMENDA 10 MG tablet TAKE 1 TABLET BY MOUTH TWICE A DAY TO PRESERVE MEMORY  . polyethylene glycol (MIRALAX / GLYCOLAX) packet Take 17 g by mouth daily.  Marland Kitchen saccharomyces boulardii (FLORASTOR) 250 MG capsule Take 1 capsule (250 mg total) by mouth 2 (two) times daily.  . [DISCONTINUED] pantoprazole (PROTONIX) 40 MG tablet Take 1 tablet (40 mg total) by mouth daily. Switch for any other PPI at similar dose and frequency   Review of Systems:  Out of a complete 14 point review of systems, all  are reviewed and negative with the exception of these symptoms as listed below:  Review of Systems  Constitutional: Negative.  HENT: Positive for drooling and trouble swallowing.   Eyes: Negative.   Respiratory: Negative.   Cardiovascular: Negative.   Gastrointestinal:       Incontinence  Endocrine: Negative.   Genitourinary: Positive for frequency and enuresis.  Musculoskeletal: Positive for back pain, gait problem and neck stiffness.  Skin: Negative.   Allergic/Immunologic: Positive for immunocompromised state.  Neurological: Positive for speech difficulty.  Hematological: Negative.   Psychiatric/Behavioral: Positive for sleep disturbance (insomnia, daytime sleepiness).    Objective:  Neurologic Exam  Physical Exam Physical Examination:   Filed Vitals:   01/04/14 1032  BP: 151/75  Pulse: 54  Temp: 97.5 F (36.4 C)   General Examination: The patient is a very pleasant 80 y.o. male in no acute distress. He is frail and minimally verbal, able to answer in one word responses. He is in his WC.   HEENT: Normocephalic, atraumatic, pupils are equal, round and reactive to light and accommodation. Extraocular tracking shows severe saccadic breakdown without nystagmus noted. There is severe limitation to entire gaze. There is moderate decrease in eye blink rate and he keeps his eyes closed most of the time. Hearing is mildly impaired. Face is symmetric with moderate facial masking and normal facial sensation. There is no lip, neck or jaw tremor. Neck is severely rigid with decreased passive ROM and severe anterocollis. There are no carotid bruits on auscultation. Oropharynx exam reveals moderate mouth dryness and partially edentulous state. No significant airway crowding is noted. Mallampati is class III. Tongue protrudes centrally and palate elevates symmetrically. There is no drooling.   Chest: is clear to auscultation without wheezing, rhonchi or crackles noted.  Heart: sounds are  regular and normal without murmurs, rubs or gallops noted.   Abdomen: is soft, non-tender and non-distended with normal bowel sounds appreciated on auscultation.  Extremities: There is trace pitting edema in the distal lower extremities bilaterally.   Skin: is warm and dry with no trophic changes noted. Age-related changes are noted on the skin.   Musculoskeletal: exam reveals no obvious joint deformities, tenderness, joint swelling or erythema.  Neurologically:  Mental status: The patient is awake and alert, paying fair to poor attention. He is unable to provide the history. His son provides the entire history. He is oriented to: self, place and situation. His memory, attention, language and knowledge are impaired. There is no aphasia, agnosia, apraxia or anomia. There is a moderate degree of bradyphrenia. Speech is very scant, moderately hypophonic with mild dysarthria noted. Mood is congruent and affect is blunted.   Cranial nerves are as described above under HEENT exam.   Motor exam: Normal bulk, and strength for age is noted. There are no dyskinesias noted.   Tone is severely rigid with absence of cogwheeling. There is overall severe bradykinesia. There is no drift or rebound. There is no tremor.  Reflexes are trace in the upper extremities and trace in the lower extremities. Toes are downgoing bilaterally.  Fine motor skills exam: Fine motor skills and globally and severely impaired.   Cerebellar testing is not possible.   Sensory exam is intact to light touch.   Gait, station and balance: He is able to stand with maximum assistance. He holds onto his son's hands. His posture is severely stooped with almost a 90 upper back kyphosis. He walks slowly in small subtle stutter steps and intermittent freezing. He has a tendency to drag his right foot. He turns in multiple steps. His balance is severely impaired with risk of falling. He  walks just briefly for about 20 steps  total.  Assessment and Plan:   In summary, Ricky [name] is a very pleasant 80 year old male with a history of recurrent syncopal spells, parkinsonism, memory loss, gait dysfunction, balance issues, recurrent falls and recurrent urinary tract infections and recurrent C. difficile colitis. His exam is consistent with progressive supranuclear palsy, advanced.  I again had a long chat with the patient and particularly his son, Ricky [name], today about my findings and the diagnosis of PSP, the prognosis and treatment options. We talked about medical treatments and non-pharmacological approaches. I encouraged them to keep him well hydrated, keep a scheduled bedtime and wake time routine, to not skip any meals and eat healthy snacks in between meals and to have protein with every meal. He has been tried on Sinemet in the past, Aricept as well it most recently on rotigotine. I will continue with the [place]. His son would like to have him try Neupro again. We talked about potential side effects and in light of his advanced age he is more at risk for side effects. Nevertheless, a limp agreeable to trying him on a low dose starting at 1 mg strength once daily for 2 weeks and then 2 mg strength once daily thereafter. I provided him with samples for her to 1 mg patch as well as the 2 mg patch, 7 days each. I asked him to watch for side effects. I also asked the son to look for improvement in stiffness and slowness. It is unlikely that he will start walking better but maybe he can walk a little bit longer. Also, we will try to initiate outpatient physical therapy again, primarily for range of motion therapy but also to help him get up and walk a little bit more. He can only walk without maximum assistance and he is at risk for falls. He does need 24-7 supervision which he is getting at this point. I would like to see him back in 3 months, sooner if the need arises and encouraged them to call with any interim questions,  concerns, problems or updates and refill requests. Unfortunately, his condition is invariably progressive and has progressed on my exam today.  Most of my 40 minute visit today was spent in counseling and coordination of care, reviewing test results and reviewing medication.

## 2014-01-04 NOTE — Telephone Encounter (Signed)
Patient son called and stated that patient needs prior auth on his Lidoderm Patches. [employer] # [id number] and spoke with [place]--sent into clinical Review Ref#  [id number]  Patient son Notified.

## 2014-01-04 NOTE — Telephone Encounter (Signed)
I called Mr Waibel back.  Advised we would leave one pack of Neupro 1mg  at the front desk.  He said they will be by Friday or later to pick it up, as he does have a one week supply currently.  Will ask Tye [place] to place meds at front desk for patient.

## 2014-01-05 ENCOUNTER — Ambulatory Visit: Payer: Medicare HMO | Admitting: Physical Therapy

## 2014-01-05 NOTE — Telephone Encounter (Signed)
Left one box of Neupro 1 mg patches at the front desk for patient to pick up, per Norva Pavlov.

## 2014-01-11 ENCOUNTER — Other Ambulatory Visit: Payer: Self-pay | Admitting: Internal Medicine

## 2014-01-15 ENCOUNTER — Inpatient Hospital Stay (HOSPITAL_COMMUNITY)
Admission: EM | Admit: 2014-01-15 | Discharge: 2014-01-17 | DRG: 312 | Disposition: A | Discharge status: Home / Self Care | Payer: Medicare HMO | Attending: Internal Medicine | Admitting: Internal Medicine

## 2014-01-15 ENCOUNTER — Emergency Department (HOSPITAL_COMMUNITY): Payer: Medicare HMO

## 2014-01-15 ENCOUNTER — Encounter (HOSPITAL_COMMUNITY): Payer: Self-pay | Admitting: Emergency Medicine

## 2014-01-15 DIAGNOSIS — E039 Hypothyroidism, unspecified: Secondary | ICD-10-CM | POA: Diagnosis present

## 2014-01-15 DIAGNOSIS — M4000 Postural kyphosis, site unspecified: Secondary | ICD-10-CM | POA: Diagnosis present

## 2014-01-15 DIAGNOSIS — M4802 Spinal stenosis, cervical region: Secondary | ICD-10-CM | POA: Diagnosis present

## 2014-01-15 DIAGNOSIS — Z87891 Personal history of nicotine dependence: Secondary | ICD-10-CM

## 2014-01-15 DIAGNOSIS — F3289 Other specified depressive episodes: Secondary | ICD-10-CM | POA: Diagnosis present

## 2014-01-15 DIAGNOSIS — R627 Adult failure to thrive: Secondary | ICD-10-CM

## 2014-01-15 DIAGNOSIS — F0280 Dementia in other diseases classified elsewhere without behavioral disturbance: Secondary | ICD-10-CM | POA: Diagnosis present

## 2014-01-15 DIAGNOSIS — R1310 Dysphagia, unspecified: Secondary | ICD-10-CM | POA: Diagnosis present

## 2014-01-15 DIAGNOSIS — N318 Other neuromuscular dysfunction of bladder: Secondary | ICD-10-CM | POA: Diagnosis present

## 2014-01-15 DIAGNOSIS — Z8673 Personal history of transient ischemic attack (TIA), and cerebral infarction without residual deficits: Secondary | ICD-10-CM

## 2014-01-15 DIAGNOSIS — K5730 Diverticulosis of large intestine without perforation or abscess without bleeding: Secondary | ICD-10-CM | POA: Diagnosis present

## 2014-01-15 DIAGNOSIS — T45515A Adverse effect of anticoagulants, initial encounter: Secondary | ICD-10-CM | POA: Diagnosis present

## 2014-01-15 DIAGNOSIS — K589 Irritable bowel syndrome without diarrhea: Secondary | ICD-10-CM | POA: Diagnosis present

## 2014-01-15 DIAGNOSIS — Z8669 Personal history of other diseases of the nervous system and sense organs: Secondary | ICD-10-CM

## 2014-01-15 DIAGNOSIS — G238 Other specified degenerative diseases of basal ganglia: Secondary | ICD-10-CM | POA: Diagnosis present

## 2014-01-15 DIAGNOSIS — R791 Abnormal coagulation profile: Secondary | ICD-10-CM | POA: Diagnosis present

## 2014-01-15 DIAGNOSIS — N182 Chronic kidney disease, stage 2 (mild): Secondary | ICD-10-CM | POA: Diagnosis present

## 2014-01-15 DIAGNOSIS — H3530 Unspecified macular degeneration: Secondary | ICD-10-CM | POA: Diagnosis present

## 2014-01-15 DIAGNOSIS — M109 Gout, unspecified: Secondary | ICD-10-CM

## 2014-01-15 DIAGNOSIS — E785 Hyperlipidemia, unspecified: Secondary | ICD-10-CM | POA: Diagnosis present

## 2014-01-15 DIAGNOSIS — G20 Parkinson's disease: Secondary | ICD-10-CM

## 2014-01-15 DIAGNOSIS — A0472 Enterocolitis due to Clostridium difficile, not specified as recurrent: Secondary | ICD-10-CM | POA: Diagnosis present

## 2014-01-15 DIAGNOSIS — G929 Unspecified toxic encephalopathy: Secondary | ICD-10-CM

## 2014-01-15 DIAGNOSIS — N400 Enlarged prostate without lower urinary tract symptoms: Secondary | ICD-10-CM

## 2014-01-15 DIAGNOSIS — I739 Peripheral vascular disease, unspecified: Secondary | ICD-10-CM | POA: Diagnosis present

## 2014-01-15 DIAGNOSIS — Z86718 Personal history of other venous thrombosis and embolism: Secondary | ICD-10-CM

## 2014-01-15 DIAGNOSIS — G20C Parkinsonism, unspecified: Secondary | ICD-10-CM | POA: Diagnosis present

## 2014-01-15 DIAGNOSIS — R531 Weakness: Secondary | ICD-10-CM

## 2014-01-15 DIAGNOSIS — G309 Alzheimer's disease, unspecified: Secondary | ICD-10-CM | POA: Diagnosis present

## 2014-01-15 DIAGNOSIS — I129 Hypertensive chronic kidney disease with stage 1 through stage 4 chronic kidney disease, or unspecified chronic kidney disease: Secondary | ICD-10-CM | POA: Diagnosis present

## 2014-01-15 DIAGNOSIS — K623 Rectal prolapse: Secondary | ICD-10-CM

## 2014-01-15 DIAGNOSIS — F0390 Unspecified dementia without behavioral disturbance: Secondary | ICD-10-CM | POA: Diagnosis present

## 2014-01-15 DIAGNOSIS — M7660 Achilles tendinitis, unspecified leg: Secondary | ICD-10-CM

## 2014-01-15 DIAGNOSIS — G92 Toxic encephalopathy: Secondary | ICD-10-CM

## 2014-01-15 DIAGNOSIS — G988 Other disorders of nervous system: Secondary | ICD-10-CM

## 2014-01-15 DIAGNOSIS — E871 Hypo-osmolality and hyponatremia: Secondary | ICD-10-CM | POA: Diagnosis present

## 2014-01-15 DIAGNOSIS — G4700 Insomnia, unspecified: Secondary | ICD-10-CM | POA: Diagnosis present

## 2014-01-15 DIAGNOSIS — R310 Gross hematuria: Secondary | ICD-10-CM | POA: Diagnosis present

## 2014-01-15 DIAGNOSIS — R55 Syncope and collapse: Principal | ICD-10-CM | POA: Diagnosis present

## 2014-01-15 DIAGNOSIS — Z86711 Personal history of pulmonary embolism: Secondary | ICD-10-CM

## 2014-01-15 DIAGNOSIS — I10 Essential (primary) hypertension: Secondary | ICD-10-CM | POA: Diagnosis present

## 2014-01-15 DIAGNOSIS — F329 Major depressive disorder, single episode, unspecified: Secondary | ICD-10-CM | POA: Diagnosis present

## 2014-01-15 DIAGNOSIS — H510 Palsy (spasm) of conjugate gaze: Secondary | ICD-10-CM | POA: Diagnosis present

## 2014-01-15 DIAGNOSIS — G608 Other hereditary and idiopathic neuropathies: Secondary | ICD-10-CM | POA: Diagnosis present

## 2014-01-15 DIAGNOSIS — M810 Age-related osteoporosis without current pathological fracture: Secondary | ICD-10-CM | POA: Diagnosis present

## 2014-01-15 DIAGNOSIS — K3184 Gastroparesis: Secondary | ICD-10-CM

## 2014-01-15 DIAGNOSIS — Z836 Family history of other diseases of the respiratory system: Secondary | ICD-10-CM

## 2014-01-15 DIAGNOSIS — Z823 Family history of stroke: Secondary | ICD-10-CM

## 2014-01-15 DIAGNOSIS — S32009A Unspecified fracture of unspecified lumbar vertebra, initial encounter for closed fracture: Secondary | ICD-10-CM | POA: Diagnosis present

## 2014-01-15 DIAGNOSIS — G3183 Dementia with Lewy bodies: Secondary | ICD-10-CM

## 2014-01-15 DIAGNOSIS — Z841 Family history of disorders of kidney and ureter: Secondary | ICD-10-CM

## 2014-01-15 DIAGNOSIS — D638 Anemia in other chronic diseases classified elsewhere: Secondary | ICD-10-CM | POA: Diagnosis present

## 2014-01-15 DIAGNOSIS — I639 Cerebral infarction, unspecified: Secondary | ICD-10-CM

## 2014-01-15 LAB — COMPREHENSIVE METABOLIC PANEL
ALT: 14 U/L (ref 0–53)
AST: 28 U/L (ref 0–37)
Albumin: 3.3 g/dL — ABNORMAL LOW (ref 3.5–5.2)
Alkaline Phosphatase: 74 U/L (ref 39–117)
BUN: 8 mg/dL (ref 6–23)
CALCIUM: 8.9 mg/dL (ref 8.4–10.5)
CO2: 24 meq/L (ref 19–32)
Chloride: 97 mEq/L (ref 96–112)
Creatinine, Ser: 0.93 mg/dL (ref 0.50–1.35)
GFR, EST AFRICAN AMERICAN: 81 mL/min — AB (ref >90)
GFR, EST NON AFRICAN AMERICAN: 70 mL/min — AB (ref >90)
GLUCOSE: 94 mg/dL (ref 70–99)
Potassium: 4.9 mEq/L (ref 3.7–5.3)
Sodium: 134 mEq/L — ABNORMAL LOW (ref 137–147)
Total Bilirubin: 0.3 mg/dL (ref 0.3–1.2)
Total Protein: 7.3 g/dL (ref 6.0–8.3)

## 2014-01-15 LAB — LACTIC ACID, PLASMA: LACTIC ACID, VENOUS: 2.5 mmol/L — AB (ref 0.5–2.2)

## 2014-01-15 LAB — CBC WITH DIFFERENTIAL/PLATELET
BASOS ABS: 0 10*3/uL (ref 0.0–0.1)
Basophils Relative: 0 % (ref 0–1)
Eosinophils Absolute: 0 10*3/uL (ref 0.0–0.7)
Eosinophils Relative: 0 % (ref 0–5)
HEMATOCRIT: 35 % — AB (ref 39.0–52.0)
Hemoglobin: 11.6 g/dL — ABNORMAL LOW (ref 13.0–17.0)
LYMPHS PCT: 17 % (ref 12–46)
Lymphs Abs: 1.2 10*3/uL (ref 0.7–4.0)
MCH: 28.9 pg (ref 26.0–34.0)
MCHC: 33.1 g/dL (ref 30.0–36.0)
MCV: 87.1 fL (ref 78.0–100.0)
Monocytes Absolute: 0.5 10*3/uL (ref 0.1–1.0)
Monocytes Relative: 8 % (ref 3–12)
Neutro Abs: 5 10*3/uL (ref 1.7–7.7)
Neutrophils Relative %: 75 % (ref 43–77)
PLATELETS: 149 10*3/uL — AB (ref 150–400)
RBC: 4.02 MIL/uL — ABNORMAL LOW (ref 4.22–5.81)
RDW: 14.5 % (ref 11.5–15.5)
WBC: 6.7 10*3/uL (ref 4.0–10.5)

## 2014-01-15 LAB — PROTIME-INR
INR: 1.17 (ref 0.00–1.49)
PROTHROMBIN TIME: 14.7 s (ref 11.6–15.2)
Prothrombin Time: >90 seconds — ABNORMAL HIGH (ref 11.6–15.2)

## 2014-01-15 LAB — TROPONIN I: Troponin I: <0.3 ng/mL (ref <0.30)

## 2014-01-15 LAB — MRSA PCR SCREENING: MRSA by PCR: NEGATIVE

## 2014-01-15 LAB — APTT: aPTT: 25 seconds (ref 24–37)

## 2014-01-15 MED ORDER — BACITRACIN ZINC 500 UNIT/GM EX OINT
1.0000 "application " | TOPICAL_OINTMENT | Freq: Two times a day (BID) | CUTANEOUS | Status: DC
  Administered 2014-01-15 – 2014-01-17 (×4): 1 via TOPICAL
  Filled 2014-01-15: qty 28.35

## 2014-01-15 MED ORDER — D-MANNOSE PO POWD: 2000.0000 mg | Freq: Two times a day (BID) | ORAL | Status: DC

## 2014-01-15 MED ORDER — VITAMIN B-12 1000 MCG PO TABS
1000.0000 ug | ORAL_TABLET | Freq: Every day | ORAL | Status: DC
  Administered 2014-01-16 – 2014-01-17 (×2): 1000 ug via ORAL
  Filled 2014-01-15 (×2): qty 1

## 2014-01-15 MED ORDER — BOOST PO LIQD
90.0000 mL | Freq: Two times a day (BID) | ORAL | Status: DC
  Administered 2014-01-16 – 2014-01-17 (×4): 90 mL via ORAL
  Filled 2014-01-15 (×6): qty 237

## 2014-01-15 MED ORDER — FERROUS SULFATE 325 (65 FE) MG PO TABS
325.0000 mg | ORAL_TABLET | ORAL | Status: DC
  Administered 2014-01-16: 325 mg via ORAL
  Filled 2014-01-15: qty 1

## 2014-01-15 MED ORDER — ATORVASTATIN CALCIUM 10 MG PO TABS
10.0000 mg | ORAL_TABLET | Freq: Every day | ORAL | Status: DC
  Administered 2014-01-16 – 2014-01-17 (×2): 10 mg via ORAL
  Filled 2014-01-15 (×2): qty 1

## 2014-01-15 MED ORDER — HYDRALAZINE HCL 25 MG PO TABS
12.5000 mg | ORAL_TABLET | Freq: Two times a day (BID) | ORAL | Status: DC
  Administered 2014-01-15 – 2014-01-17 (×4): 12.5 mg via ORAL
  Filled 2014-01-15 (×5): qty 0.5

## 2014-01-15 MED ORDER — ADULT MULTIVITAMIN W/MINERALS CH
1.0000 | ORAL_TABLET | Freq: Every day | ORAL | Status: DC
  Administered 2014-01-16 – 2014-01-17 (×2): 1 via ORAL
  Filled 2014-01-15 (×2): qty 1

## 2014-01-15 MED ORDER — SACCHAROMYCES BOULARDII 250 MG PO CAPS
250.0000 mg | ORAL_CAPSULE | Freq: Two times a day (BID) | ORAL | Status: DC
  Administered 2014-01-16 – 2014-01-17 (×3): 250 mg via ORAL
  Filled 2014-01-15 (×5): qty 1

## 2014-01-15 MED ORDER — ACETAMINOPHEN 650 MG RE SUPP: 650.0000 mg | Freq: Four times a day (QID) | RECTAL | Status: DC | PRN

## 2014-01-15 MED ORDER — MELATONIN 5 MG/15ML PO LIQD: Freq: Every evening | ORAL | Status: DC | PRN

## 2014-01-15 MED ORDER — LIDOCAINE 5 % EX PTCH
1.0000 | MEDICATED_PATCH | Freq: Every day | CUTANEOUS | Status: DC | PRN
  Filled 2014-01-15: qty 1

## 2014-01-15 MED ORDER — ACETAMINOPHEN 325 MG PO TABS: 650.0000 mg | ORAL_TABLET | Freq: Four times a day (QID) | ORAL | Status: DC | PRN

## 2014-01-15 MED ORDER — MEMANTINE HCL 10 MG PO TABS
10.0000 mg | ORAL_TABLET | Freq: Two times a day (BID) | ORAL | Status: DC
  Administered 2014-01-15 – 2014-01-17 (×4): 10 mg via ORAL
  Filled 2014-01-15 (×5): qty 1

## 2014-01-15 MED ORDER — ROTIGOTINE 2 MG/24HR TD PT24: 1.0000 | MEDICATED_PATCH | Freq: Every day | TRANSDERMAL | Status: DC

## 2014-01-15 MED ORDER — CALCIUM CARBONATE-VITAMIN D 500-200 MG-UNIT PO TABS
1.0000 | ORAL_TABLET | Freq: Two times a day (BID) | ORAL | Status: DC
  Administered 2014-01-16 – 2014-01-17 (×4): 1 via ORAL
  Filled 2014-01-15 (×6): qty 1

## 2014-01-15 MED ORDER — MORPHINE SULFATE 2 MG/ML IJ SOLN: 1.0000 mg | INTRAMUSCULAR | Status: DC | PRN

## 2014-01-15 MED ORDER — POLYETHYLENE GLYCOL 3350 17 G PO PACK
17.0000 g | PACK | Freq: Every day | ORAL | Status: DC
  Administered 2014-01-16 – 2014-01-17 (×2): 17 g via ORAL
  Filled 2014-01-15 (×2): qty 1

## 2014-01-15 MED ORDER — VITAMIN B-12 2500 MCG SL SUBL: 5000.0000 ug | SUBLINGUAL_TABLET | Freq: Every day | SUBLINGUAL | Status: DC

## 2014-01-15 MED ORDER — ALIGN PO CAPS: 1.0000 | ORAL_CAPSULE | Freq: Every day | ORAL | Status: DC

## 2014-01-15 MED ORDER — SODIUM CHLORIDE 0.9 % IV SOLN
INTRAVENOUS | Status: AC
  Administered 2014-01-16 (×2): via INTRAVENOUS

## 2014-01-15 MED ORDER — LEVOTHYROXINE SODIUM 25 MCG PO TABS
25.0000 ug | ORAL_TABLET | Freq: Every day | ORAL | Status: DC
  Administered 2014-01-16 – 2014-01-17 (×2): 25 ug via ORAL
  Filled 2014-01-15 (×4): qty 1

## 2014-01-15 MED ORDER — CALTRATE 600+D PLUS MINERALS 600-800 MG-UNIT PO CHEW: 1.0000 | CHEWABLE_TABLET | Freq: Two times a day (BID) | ORAL | Status: DC

## 2014-01-15 MED ORDER — LORAZEPAM 1 MG PO TABS: 1.0000 mg | ORAL_TABLET | Freq: Once | ORAL | Status: DC | PRN

## 2014-01-15 NOTE — Progress Notes (Signed)
Spoke with patient's son about med Neupro, who agreed to bring it from home since pharmacy is unable to supply the med.

## 2014-01-15 NOTE — ED Provider Notes (Signed)
CSN: [id number]     Arrival date & time 01/15/14  1214 History   First MD Initiated Contact with Patient 01/15/14 1216     Chief Complaint  Patient presents with  . Altered Mental Status     HPI  Patient presents with his son who provides the history of present illness. 5 caveat. Patient's son states that the patient has progressive neurologic disease. Prior to this, 5 years ago, the patient was healthy. Over the ensuing 5 years patient has had progressive atrophy, contraction, tremor, decreased cognition. Patient also suffers from recurrent syncopal events. Patient had chronic Foley catheter placed less than one year ago. Today the patient presents after a syncopal event that was initially typical for him.  However, patient's syncope lasted longer than usual, greater than 5/10 minutes.  With longer symptoms, failure to recover to baseline patient was brought here for evaluation. EMS notes that initial blood pressure was 60/palpable.  This increased to normal tension. Patient's son states that prior to the episode the patient was prepared using the bathroom. No report of incontinence of stool, vomiting. Patient has history of aspiration pneumonia and C. difficile colitis as well. No current antibiotics.  Last antibiotics earlier this year.   Past Medical History  Diagnosis Date  . IBS (irritable bowel syndrome)   . Diverticulosis   . Cataract   . OAB (overactive bladder)   . Macular degeneration   . Alzheimer disease   . Hypertension   . Arthritis   . Osteoporosis   . Parkinsonian syndrome   . Syncope, vasovagal   . Neuromuscular disorder     parkisonian syndrome  . 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 Unspecified transient cerebral ischemia   . Unspecified urinary incontinence   . Senile dementia, uncomplicated   . Unspecified constipation   . Hypothyroidism   . Anemia, unspecified   . Depression   . Alzheimer's disease   . Gout,  unspecified   . Thoracic or lumbosacral neuritis or radiculitis, unspecified   . Progressive supranuclear palsy   . Unspecified transient cerebral ischemia   . Unspecified urinary incontinence   . Syncope and collapse   . Paralysis agitans   . Unspecified transient cerebral ischemia   . Pneumonitis due to inhalation of food or vomitus   . Cellulitis and abscess of oral soft tissues   . Screening for lipoid disorders   . Senile dementia, uncomplicated   . Unspecified constipation   . Disorder of bone and cartilage, unspecified   . Other specified acquired hypothyroidism   . Unspecified hypothyroidism   . Anemia, unspecified   . Depressive disorder, not elsewhere classified   . Alzheimer's disease   . Paralysis agitans   . Pneumonia, organism unspecified   . Pseudomonas infection in conditions classified elsewhere and of unspecified site   . Unspecified essential hypertension   . Urinary tract infection, site not specified   . Osteoarthrosis, unspecified whether generalized or localized, unspecified site   . Osteoporosis, unspecified   . Gout, unspecified   . Thoracic or lumbosacral neuritis or radiculitis, unspecified   . Stroke    Past Surgical History  Procedure Laterality Date  . Esophagogastroduodenoscopy    . Eye surgery  2006    LEFT CATARACT   Family History  Problem Relation Age of Onset  . Cancer Mother   . Stroke Father   . Liver disease Father   . Kidney disease Brother   . Liver disease Brother   .  Emphysema Brother    History  Substance Use Topics  . Smoking status: Former Research scientist (life sciences)  . Smokeless tobacco: Never Used     Comment: QUIT SMOKING BACK IN THE LATE 50'S  . Alcohol Use: No    Review of Systems  Unable to perform ROS: Mental status change      Allergies  Other  Home Medications   Current Outpatient Rx  Name  Route  Sig  Dispense  Refill  . acetaminophen (TYLENOL) 500 MG tablet   Oral   Take 500 mg by mouth 3 (three) times daily.           Marland Kitchen aspirin EC 325 MG EC tablet   Oral   Take 1 tablet (325 mg total) by mouth daily.   30 tablet   3   . atorvastatin (LIPITOR) 10 MG tablet   Oral   Take 1 tablet (10 mg total) by mouth daily at 6 PM.   90 tablet   1   . bacitracin ointment   Topical   Apply 1 application topically 2 (two) times daily. Applied to tip of penis for skin tear.         . bifidobacterium infantis (ALIGN) capsule      TAKE ONE CAPSULE BY MOUTH TWICE A DAY FOR 14 DAYS   28 capsule   0   . Calcium Carbonate-Vit D-Min (CALTRATE 600+D PLUS) 600-800 MG-UNIT CHEW   Oral   Chew 1 tablet by mouth 2 (two) times daily with a meal.          . CRANBERRY PO   Oral   Take 4,200 mg by mouth 3 (three) times daily.         . Cyanocobalamin (VITAMIN B-12) 2500 MCG SUBL   Sublingual   Place 5,000 mcg under the tongue daily.          . D-Mannose POWD   Oral   Take 2,000 mg by mouth 2 (two) times daily.         . ferrous sulfate 325 (65 FE) MG tablet   Oral   Take 325 mg by mouth every other day.          Marland Kitchen guaiFENesin (MUCINEX) 600 MG 12 hr tablet   Oral   Take 1,200 mg by mouth 2 (two) times daily as needed for congestion.          . hydrALAZINE (APRESOLINE) 25 MG tablet   Oral   Take 0.5 tablets (12.5 mg total) by mouth 2 (two) times daily.   45 tablet   1   . hydrALAZINE (APRESOLINE) 25 MG tablet      TAKE 1/2 TABLET BY MOUTH TWICE DAILY   45 tablet   5   . lactose free nutrition (BOOST) LIQD   Oral   Take 90 mLs by mouth 2 (two) times daily between meals.          Marland Kitchen levothyroxine (SYNTHROID, LEVOTHROID) 25 MCG tablet   Oral   Take 25 mcg by mouth daily before breakfast.         . lidocaine (LIDODERM) 5 %   Transdermal   Place 1 patch onto the skin daily as needed (for back pain). Remove & Discard patch within 12 hours or as directed by MD         . MELATONIN EXTRA STRENGTH PO   Oral   Take 15 mLs by mouth at bedtime as needed (sleep).          Marland Kitchen  memantine (NAMENDA) 10 MG tablet   Oral   Take 10 mg by mouth 2 (two) times daily.         . Multiple Vitamin (MULTIVITAMIN WITH MINERALS) TABS   Oral   Take 1 tablet by mouth daily.         . Multiple Vitamins-Minerals (ICAPS AREDS FORMULA PO)   Oral   Take 2 tablets by mouth 2 (two) times daily.         Marland Kitchen NAMENDA 10 MG tablet      TAKE 1 TABLET BY MOUTH TWICE A DAY TO PRESERVE MEMORY   180 tablet   1   . polyethylene glycol (MIRALAX / GLYCOLAX) packet   Oral   Take 17 g by mouth daily.         . rotigotine (NEUPRO) 2 MG/24HR   Transdermal   Place 1 patch onto the skin daily.   30 patch   5   . saccharomyces boulardii (FLORASTOR) 250 MG capsule   Oral   Take 1 capsule (250 mg total) by mouth 2 (two) times daily.   120 capsule   0    BP 130/80  Temp(Src) 97.5 F (36.4 C) (Oral)  Resp 16  Wt 160 lb (72.576 kg)  SpO2 96% Physical Exam  Constitutional: He appears ill.  Tremulous elderly male resting, seemingly comfortable   HENT:  Head: Normocephalic and atraumatic.  Eyes: Conjunctivae are normal. Right eye exhibits no discharge. Left eye exhibits no discharge.  Neck: No tracheal deviation present.  Cardiovascular: Normal rate and regular rhythm.   Pulmonary/Chest: Effort normal. No stridor. No respiratory distress.  Abdominal: He exhibits no distension.  Genitourinary:  Foley catheter draining blood-colored urine  Musculoskeletal:  No gross evidence of trauma, though the patient is in contraction, moving all extremities minimally.  Neurological:  Patient is essentially nonverbal, disoriented, in contraction with his upper extremities. Patient's son states that this is baseline for the patient  Skin: Skin is warm.  Psychiatric: Cognition and memory are impaired.    ED Course  Procedures (including critical care time) Labs Review Labs Reviewed  COMPREHENSIVE METABOLIC PANEL - Abnormal; Notable for the following:    Sodium 134 (*)    Albumin 3.3  (*)    GFR calc non Af Amer 70 (*)    GFR calc Af Amer 81 (*)    All other components within normal limits  LACTIC ACID, PLASMA - Abnormal; Notable for the following:    Lactic Acid, Venous 2.5 (*)    All other components within normal limits  PROTIME-INR - Abnormal; Notable for the following:    Prothrombin Time >90.0 (*)    INR >10.00 (*)    All other components within normal limits  CBC WITH DIFFERENTIAL  CBC WITH DIFFERENTIAL   Imaging Review Dg Chest Port 1 View  01/15/2014   CLINICAL DATA:  Altered mental status  EXAM: PORTABLE CHEST - 1 VIEW  COMPARISON:  10/18/2013  FINDINGS: Cardiomediastinal silhouette is stable. No acute infiltrate or pulmonary edema. Stable bilateral small pulmonary nodules.  IMPRESSION: No active disease.  Stable bilateral small pulmonary nodules.   Electronically Signed   By: Natasha Mead M.D.   On: 01/15/2014 13:04     EKG Interpretation   Date/Time:  Sunday January 15 2014 13:01:08 EST Ventricular Rate:  57 PR Interval:    QRS Duration: 157 QT Interval:  432 QTC Calculation: 421 R Axis:   -41 Text Interpretation:  Junctional rhythm RBBB and LAFB  Left ventricular  hypertrophy Artifact in lead(s) I II III aVR aVL aVF V1 V2 V5 regular  rhythm  - juntional vs. sinus obscured by artefact. Non-specific  intra-ventricular conduction delay similar tracing to most recent Abnormal  ekg Confirmed by Carmin Muskrat  MD [phone]) on 01/15/2014 1:39:34 PM        On re-exam the patient appears calm.  Labs notable for elevated PT, INR. Patient is not on an anticoagulant. CT head pending (given elevated INR)   MDM    Patient presents after a syncopal episode.  Notably, the patient has neurologic dysfunction, progressive over the past 5 years.  On exam patient has a tremor, is nearly nonverbal, but denies ongoing complaints.  Patient is hemodynamically stable.  Patient's son is the historian.  He describes a largely typical syncopal episode, though with  prolonged loss of consciousness.  With this change in characteristics, patient's reported hypotension, and his elevated PT, patient was admitted for further evaluation and management.  In the emergency department the patient's blood pressure remained stable, the patient was in no distress, though he continued to have bright red blood in his urinary catheter.  Carmin Muskrat, MD 01/15/14 [phone]

## 2014-01-15 NOTE — ED Notes (Signed)
Lab called with results of Pt/INR elevated. EDP notified and orders received to repeat test off new blood.

## 2014-01-15 NOTE — Progress Notes (Signed)
PHARMACIST - PHYSICIAN ORDER COMMUNICATION  CONCERNING: P&T Medication Policy on Herbal Medications  DESCRIPTION:  This patient's order for:  D-mannose and melatoin  have been noted.  This product(s) is classified as an "herbal" or natural product. Due to a lack of definitive safety studies or FDA approval, nonstandard manufacturing practices, plus the potential risk of unknown drug-drug interactions while on inpatient medications, the Pharmacy and Therapeutics Committee does not permit the use of "herbal" or natural products of this type within [facility].   ACTION TAKEN: The pharmacy department is unable to verify this order at this time and your patient has been informed of this safety policy. Please reevaluate patient's clinical condition at discharge and address if the herbal or natural product(s) should be resumed at that time.

## 2014-01-15 NOTE — ED Notes (Signed)
Lab states original CBC clotted, another CBC order placed.

## 2014-01-15 NOTE — H&P (Addendum)
[facility] History and Physical  Ricky [name] [id number] DOB: [birth year] DOA: 01/15/2014   Referring physician:  PCP: Hollace Kinnier, DO  Specialists:   Chief Complaint: Altered mental status  HPI: Ricky [name] is a 80 y.o. male  With a history of progressive supranuclear palsy with dementia, hypertension, hypothyroidism that presented to the ER for altered mental status and syncopal episode.  Patient's on stated that this has happened in the past however, episodes usually resolve within 5-10 minutes. Patient has had about 20 episodes in the past few years.  Traditionally his blood pressure drops, he becomes sweaty, with a tremor of the hand.  However, today, there was no sweating or tremors.  Episode lasted about 15-20 minutes.  Of note, patient has had a holter monitor in the past.  He sees Dr. Harrington Challenger.  The holter monitor did not show any cardiac arrhythmias in 2013. At this time, per the son, he has not been ill recently, he has not had any sick contacts, no recent travel.  Review of Systems:  Unobtainable due to patient's current state.  Past Medical History  Diagnosis Date  . IBS (irritable bowel syndrome)   . Diverticulosis   . Cataract   . OAB (overactive bladder)   . Macular degeneration   . Alzheimer disease   . Hypertension   . Arthritis   . Osteoporosis   . Parkinsonian syndrome   . Syncope, vasovagal   . Neuromuscular disorder     parkisonian syndrome  . 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 Unspecified transient cerebral ischemia   . Unspecified urinary incontinence   . Senile dementia, uncomplicated   . Unspecified constipation   . Hypothyroidism   . Anemia, unspecified   . Depression   . Alzheimer's disease   . Gout, unspecified   . Thoracic or lumbosacral neuritis or radiculitis, unspecified   . Progressive supranuclear palsy   . Unspecified transient cerebral ischemia   . Unspecified urinary incontinence   .  Syncope and collapse   . Paralysis agitans   . Unspecified transient cerebral ischemia   . Pneumonitis due to inhalation of food or vomitus   . Cellulitis and abscess of oral soft tissues   . Screening for lipoid disorders   . Senile dementia, uncomplicated   . Unspecified constipation   . Disorder of bone and cartilage, unspecified   . Other specified acquired hypothyroidism   . Unspecified hypothyroidism   . Anemia, unspecified   . Depressive disorder, not elsewhere classified   . Alzheimer's disease   . Paralysis agitans   . Pneumonia, organism unspecified   . Pseudomonas infection in conditions classified elsewhere and of unspecified site   . Unspecified essential hypertension   . Urinary tract infection, site not specified   . Osteoarthrosis, unspecified whether generalized or localized, unspecified site   . Osteoporosis, unspecified   . Gout, unspecified   . Thoracic or lumbosacral neuritis or radiculitis, unspecified   . Stroke    Past Surgical History  Procedure Laterality Date  . Esophagogastroduodenoscopy    . Eye surgery  2006    LEFT CATARACT   Social History:  reports that he has quit smoking. He has never used smokeless tobacco. He reports that he does not drink alcohol or use illicit drugs. Lives at home with his son.  Sometimes uses a walker, however, son aids him.    Allergies  Allergen Reactions  . Other Other (See Comments)  Dairy products- cause swallowing difficulty     Family History  Problem Relation Age of Onset  . Cancer Mother   . Stroke Father   . Liver disease Father   . Kidney disease Brother   . Liver disease Brother   . Emphysema Brother     Prior to Admission medications   Medication Sig Start Date End Date Taking? Authorizing Provider  acetaminophen (TYLENOL) 500 MG tablet Take 500 mg by mouth 3 (three) times daily.     Historical Provider, MD  aspirin EC 325 MG EC tablet Take 1 tablet (325 mg total) by mouth daily. 02/03/13    Ripudeep Krystal Eaton, MD  atorvastatin (LIPITOR) 10 MG tablet Take 1 tablet (10 mg total) by mouth daily at 6 PM. 08/29/13 08/29/14  Tiffany L Reed, DO  bacitracin ointment Apply 1 application topically 2 (two) times daily. Applied to tip of penis for skin tear.    Historical Provider, MD  bifidobacterium infantis (ALIGN) capsule TAKE ONE CAPSULE BY MOUTH TWICE A DAY FOR 14 DAYS 11/20/13   Tiffany L Reed, DO  Calcium Carbonate-Vit D-Min (CALTRATE 600+D PLUS) 600-800 MG-UNIT CHEW Chew 1 tablet by mouth 2 (two) times daily with a meal.     Historical Provider, MD  CRANBERRY PO Take 4,200 mg by mouth 3 (three) times daily.    Historical Provider, MD  Cyanocobalamin (VITAMIN B-12) 2500 MCG SUBL Place 5,000 mcg under the tongue daily.     Historical Provider, MD  D-Mannose POWD Take 2,000 mg by mouth 2 (two) times daily.    Historical Provider, MD  ferrous sulfate 325 (65 FE) MG tablet Take 325 mg by mouth every other day.     Historical Provider, MD  guaiFENesin (MUCINEX) 600 MG 12 hr tablet Take 1,200 mg by mouth 2 (two) times daily as needed for congestion.     Historical Provider, MD  hydrALAZINE (APRESOLINE) 25 MG tablet Take 0.5 tablets (12.5 mg total) by mouth 2 (two) times daily. 08/29/13   Tiffany L Reed, DO  hydrALAZINE (APRESOLINE) 25 MG tablet TAKE 1/2 TABLET BY MOUTH TWICE DAILY 01/11/14   Tiffany L Reed, DO  lactose free nutrition (BOOST) LIQD Take 90 mLs by mouth 2 (two) times daily between meals.     Historical Provider, MD  levothyroxine (SYNTHROID, LEVOTHROID) 25 MCG tablet Take 25 mcg by mouth daily before breakfast.    Historical Provider, MD  lidocaine (LIDODERM) 5 % Place 1 patch onto the skin daily as needed (for back pain). Remove & Discard patch within 12 hours or as directed by MD 09/16/13   Tiffany L Reed, DO  MELATONIN EXTRA STRENGTH PO Take 15 mLs by mouth at bedtime as needed (sleep).     Historical Provider, MD  memantine (NAMENDA) 10 MG tablet Take 10 mg by mouth 2 (two) times  daily.    Historical Provider, MD  Multiple Vitamin (MULTIVITAMIN WITH MINERALS) TABS Take 1 tablet by mouth daily.    Historical Provider, MD  Multiple Vitamins-Minerals (ICAPS AREDS FORMULA PO) Take 2 tablets by mouth 2 (two) times daily.    Historical Provider, MD  NAMENDA 10 MG tablet TAKE 1 TABLET BY MOUTH TWICE A DAY TO PRESERVE MEMORY 12/06/13   Tiffany L Reed, DO  polyethylene glycol (MIRALAX / GLYCOLAX) packet Take 17 g by mouth daily.    Historical Provider, MD  rotigotine (NEUPRO) 2 MG/24HR Place 1 patch onto the skin daily. 01/04/14   Star Age, MD  saccharomyces boulardii (FLORASTOR) 250  MG capsule Take 1 capsule (250 mg total) by mouth 2 (two) times daily. 12/05/13   Gayland Curry, DO   Physical Exam: Filed Vitals:   01/15/14 1600  BP: 120/65  Pulse: 67  Temp:   Resp: 19     General: Well developed, malnourished, NAD, appears stated age  HEENT: NCAT, PERRLA, EOMI, Anicteic Sclera, mucous membranes moist. No pharyngeal erythema or exudates  Neck: Supple, no JVD, no masses  Cardiovascular: S1 S2 auscultated, Regular rate and rhythm.  Respiratory: Clear to auscultation bilaterally with equal chest rise  Abdomen: Soft, nontender, nondistended, + bowel sounds  GU: Foley catheter in place with gross hematuria  Extremities: warm dry without cyanosis clubbing or edema.  Upper extremity contractures.   Neuro: Unable to assess.  Patient can answers some questions, however does not follow all commands.  CN testing could not be completed as patient was not cooperating.   Skin: Without rashes exudates or nodules  Psych: Unable to assess.  Labs on Admission:  Basic Metabolic Panel:  Recent Labs Lab 01/15/14 1424  NA 134*  K 4.9  CL 97  CO2 24  GLUCOSE 94  BUN 8  CREATININE 0.93  CALCIUM 8.9   Liver Function Tests:  Recent Labs Lab 01/15/14 1424  AST 28  ALT 14  ALKPHOS 74  BILITOT 0.3  PROT 7.3  ALBUMIN 3.3*   No results found for this basename:  LIPASE, AMYLASE,  in the last 168 hours No results found for this basename: AMMONIA,  in the last 168 hours CBC:  Recent Labs Lab 01/15/14 1532  WBC 6.7  NEUTROABS 5.0  HGB 11.6*  HCT 35.0*  MCV 87.1  PLT 149*   Cardiac Enzymes: No results found for this basename: CKTOTAL, CKMB, CKMBINDEX, TROPONINI,  in the last 168 hours  BNP (last 3 results) No results found for this basename: PROBNP,  in the last 8760 hours CBG: No results found for this basename: GLUCAP,  in the last 168 hours  Radiological Exams on Admission: Dg Lumbar Spine 2-3 Views  01/15/2014   CLINICAL DATA:  Lower back pain  EXAM: LUMBAR SPINE - 2-3 VIEW  COMPARISON:  09/09/2013  FINDINGS: Three views of lumbar spine submitted. There is diffuse osteopenia. Progression of compression fracture of L1 vertebral body. Alignment and disc spaces are grossly preserved.  IMPRESSION: Progression of compression fracture of L1 vertebral body. Study is limited by diffuse osteopenia. Alignment and disc spaces are grossly preserved.   Electronically Signed   By: Lahoma Crocker M.D.   On: 01/15/2014 16:50   Dg Chest Port 1 View  01/15/2014   CLINICAL DATA:  Altered mental status  EXAM: PORTABLE CHEST - 1 VIEW  COMPARISON:  10/18/2013  FINDINGS: Cardiomediastinal silhouette is stable. No acute infiltrate or pulmonary edema. Stable bilateral small pulmonary nodules.  IMPRESSION: No active disease.  Stable bilateral small pulmonary nodules.   Electronically Signed   By: Lahoma Crocker M.D.   On: 01/15/2014 13:04    EKG: Independently reviewed. Junctional rhythm, RBBB  Assessment/Plan  Syncope Will admit the patient to the step down unit.  CT of the head is pending.  Will place him on telemetry to monitor for any tachy or bradyarrhythmias.  Will also obtain TSH, Vit B12, Folate levels.  Will make patient NPO except medications.  Will order speech evaluation and physical therapy evaluation.  Will also obtain neuro checks and obtain orthostatic  vitals.  Will consult neurology.  Last echocardiogram was in 10/2011  showing EF 55-60% with a grade 1 diastolic dysfunction.  Will order echocardiogram. Will also monitor troponin q6 hours.    Elevated INR Patient is not on any anticoagulation medications.  His liver functions tests including his transaminases, total protein, bilirubin and albumin are within normal limits.  Will retest INR.  If still elevated, may consider hematology consult.  Gross hematuria with indwelling foley catheter Will place patient on IVF and continue monitor.  According to the son, this is normal and comes and goes.  Patient has seen a urologist in the past.  Started having blood recently 01/12/14, however has started to clear today.  ?Parkinsons/Supranuclear gaze palsy with Dementia Continue Namenda.  Patient has been taking Neupro.   Lower back pain Continue lidoderm patch and pain control.  Xray of lumbar spine showed Progression of compression fracture of L1 vertebral body. Study is limited by diffuse osteopenia. Patient fell back in June 2014.  Hypothyroidism Continue synthroid.  Will also check TSH level  Hyperlipidemia Continue atorvastatin  Hypertension Continue hydralazine   Recent C Diff colitis In January 2015, will continue florastor and align.  Insomnia Continue melatonin  History of pulmonary embolism 2012 Patient was on coumadin for about 1.5 years through March 2013.  He has not been on any anticoagulation since 2014.   DVT prophylaxis: SCDs  Code Status: Full   Condition: Guarded  Family Communication: None at bedside, however contacted son via phone.  Admission, patients condition and plan of care including tests being ordered have been discussed with the patient's son who indicate understanding and agrees with the plan and Code Status.  Disposition Plan: Admitted   Time spent: 60 minutes  Ziyan Schoon D.O. [facility] Pager [phone]  If 7PM-7AM, please contact  night-coverage [email or web address] Password [email or web address] 01/15/2014, 4:59 PM

## 2014-01-15 NOTE — ED Notes (Signed)
Patient arrived via [facility] from home with syncopal episode. Patient has a history of syncopal episodes but this one lasted longer than usual. He was hypotensive with a palpated BP 60. EMS arrived a patient was still unresponsive. He came around after patient was moved to stretcher and put in trendelenburg. Patient has a foley in place that has frank blood in it that the family states is normal. He was seen here in December and were told the blood will subside but it has intermittently appear since then.

## 2014-01-16 ENCOUNTER — Inpatient Hospital Stay (HOSPITAL_COMMUNITY): Payer: Medicare HMO

## 2014-01-16 DIAGNOSIS — R55 Syncope and collapse: Principal | ICD-10-CM

## 2014-01-16 DIAGNOSIS — N182 Chronic kidney disease, stage 2 (mild): Secondary | ICD-10-CM | POA: Diagnosis present

## 2014-01-16 DIAGNOSIS — D638 Anemia in other chronic diseases classified elsewhere: Secondary | ICD-10-CM | POA: Diagnosis present

## 2014-01-16 DIAGNOSIS — I359 Nonrheumatic aortic valve disorder, unspecified: Secondary | ICD-10-CM

## 2014-01-16 LAB — BASIC METABOLIC PANEL
BUN: 7 mg/dL (ref 6–23)
CALCIUM: 8.9 mg/dL (ref 8.4–10.5)
CO2: 23 meq/L (ref 19–32)
Chloride: 99 mEq/L (ref 96–112)
Creatinine, Ser: 0.84 mg/dL (ref 0.50–1.35)
GFR calc Af Amer: 84 mL/min — ABNORMAL LOW (ref >90)
GFR, EST NON AFRICAN AMERICAN: 73 mL/min — AB (ref >90)
GLUCOSE: 90 mg/dL (ref 70–99)
POTASSIUM: 3.9 meq/L (ref 3.7–5.3)
SODIUM: 134 meq/L — AB (ref 137–147)

## 2014-01-16 LAB — TROPONIN I

## 2014-01-16 LAB — CBC
HCT: 32.6 % — ABNORMAL LOW (ref 39.0–52.0)
HEMOGLOBIN: 10.8 g/dL — AB (ref 13.0–17.0)
MCH: 28.4 pg (ref 26.0–34.0)
MCHC: 33.1 g/dL (ref 30.0–36.0)
MCV: 85.8 fL (ref 78.0–100.0)
Platelets: 158 10*3/uL (ref 150–400)
RBC: 3.8 MIL/uL — AB (ref 4.22–5.81)
RDW: 14.2 % (ref 11.5–15.5)
WBC: 4.7 10*3/uL (ref 4.0–10.5)

## 2014-01-16 LAB — FOLATE RBC: RBC Folate: 837 ng/mL — ABNORMAL HIGH (ref >280)

## 2014-01-16 LAB — VITAMIN B12

## 2014-01-16 LAB — TSH: TSH: 0.84 u[IU]/mL (ref 0.350–4.500)

## 2014-01-16 MED ORDER — RESOURCE THICKENUP CLEAR PO POWD
ORAL | Status: DC | PRN
  Filled 2014-01-16: qty 125

## 2014-01-16 MED ORDER — RESOURCE THICKENUP CLEAR PO POWD
ORAL | Status: DC | PRN
  Filled 2014-01-16 (×2): qty 125

## 2014-01-16 NOTE — Consult Note (Signed)
NEURO HOSPITALIST CONSULT NOTE    Reason for Consult: recurrent syncope, stroke?, seizure?  HPI:                                                                                                                                          Ricky [name] is an 80 y.o. male, right handed with multiple medical problems including HTN,  PSP, dementia, hypothyroidism, brought to [facility] ED due to prolonged altered mental status and syncope. Patient is alert and awake abut unable to provide information regarding his medical condition and thus all history is obtained from the chart. Mr. Saintjean has been experiencing recurrent syncopal episodes apparently secondary to his underlying atypical parkinsonism, as a Holter monitor in the past was unrevealing at the time of one of those episodes. As per chart recollection, " patient has had about 20 episodes in the past few years. Traditionally his blood pressure drops, he becomes sweaty, with a tremor of the hand. However, today, there was no sweating or tremors. Episode lasted about 15-20 minutes". Work up in he hospital includes CT brain that showed " question acute small right cerebellar/pontine infarct versus streak artifact from motion". Serologies demonstrated normal white count, mild hyponatremia.      Past Medical History  Diagnosis Date  . IBS (irritable bowel syndrome)   . Diverticulosis   . Cataract   . OAB (overactive bladder)   . Macular degeneration   . Alzheimer disease   . Hypertension   . Arthritis   . Osteoporosis   . Parkinsonian syndrome   . Syncope, vasovagal   . Neuromuscular disorder     parkisonian syndrome  . 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 Unspecified transient cerebral ischemia   . Unspecified urinary incontinence   . Senile dementia, uncomplicated   . Unspecified constipation   . Hypothyroidism   . Anemia, unspecified   . Depression   . Alzheimer's disease   . Gout,  unspecified   . Thoracic or lumbosacral neuritis or radiculitis, unspecified   . Progressive supranuclear palsy   . Unspecified transient cerebral ischemia   . Unspecified urinary incontinence   . Syncope and collapse   . Paralysis agitans   . Unspecified transient cerebral ischemia   . Pneumonitis due to inhalation of food or vomitus   . Cellulitis and abscess of oral soft tissues   . Screening for lipoid disorders   . Senile dementia, uncomplicated   . Unspecified constipation   . Disorder of bone and cartilage, unspecified   . Other specified acquired hypothyroidism   . Unspecified hypothyroidism   . Anemia, unspecified   . Depressive disorder, not elsewhere classified   . Alzheimer's disease   . Paralysis agitans   . Pneumonia, organism unspecified   .  Pseudomonas infection in conditions classified elsewhere and of unspecified site   . Unspecified essential hypertension   . Urinary tract infection, site not specified   . Osteoarthrosis, unspecified whether generalized or localized, unspecified site   . Osteoporosis, unspecified   . Gout, unspecified   . Thoracic or lumbosacral neuritis or radiculitis, unspecified   . Stroke     Past Surgical History  Procedure Laterality Date  . Esophagogastroduodenoscopy    . Eye surgery  2006    LEFT CATARACT    Family History  Problem Relation Age of Onset  . Cancer Mother   . Stroke Father   . Liver disease Father   . Kidney disease Brother   . Liver disease Brother   . Emphysema Brother     Social History:  reports that he has quit smoking. He has never used smokeless tobacco. He reports that he does not drink alcohol or use illicit drugs.  Allergies  Allergen Reactions  . Other Other (See Comments)    Dairy products- cause swallowing difficulty     MEDICATIONS:                                                                                                                     I have reviewed the patient's current  medications.   ROS: unable to obtain due to mental status.                                                                                                                     History obtained from chart review   Physical exam: pleasant male in no apparent distress. Blood pressure 125/69, pulse 58, temperature 98.7 F (37.1 C), temperature source Oral, resp. rate 18, height _0  (1.854 m), weight 69.3 kg (152 lb 12.5 oz), SpO2 96.00%. Head: normocephalic. Neck: supple, no bruits, no JVD. Cardiac: no murmurs. Lungs: clear. Abdomen: soft, no tender, no mass. Extremities: no edema. CV: pulses palpable throughout  Neurologic Examination:  Mental Status: Alert, awake, oriented only to place.  Speech with mild dysarthria but without evidence of aphasia. Follows simple commands inconsistently.   Cranial Nerves: II: unable to evaluate fundi and visual fields as patient doesn't cooperate, but pupils equal, round, reactive to light  III,IV, VI: ptosis not present, horizontal eye movements intact but he is uncooperative and can not reliably evaluate vertical gaze. V,VII: smile symmetric, facial light touch sensation normal bilaterally VIII: hearing normal bilaterally IX,X: gag reflex present XI: bilateral shoulder shrug XII: midline tongue extension without atrophy or fasciculations Motor: Minimal movements lower extremities. Can not appreciate spontaneous movements upper extremities but he is not following my commands consistently  Tone increased throughout Sensory: reacts to noxious stimuli Deep Tendon Reflexes:  1 all over Plantars: Right: downgoing   Left: downgoing Cerebellar: Unable to test Gait:  Unable to test    Lab Results  Component Value Date/Time   CHOL 176 06/21/2012  3:13 AM    Results for orders placed during the hospital encounter of 01/15/14 (from the past 48  hour(s))  COMPREHENSIVE METABOLIC PANEL     Status: Abnormal   Collection Time    01/15/14  2:24 PM      Result Value Ref Range   Sodium 134 (*) 137 - 147 mEq/L   Potassium 4.9  3.7 - 5.3 mEq/L   Comment: SLIGHT HEMOLYSIS     HEMOLYSIS AT THIS LEVEL MAY AFFECT RESULT   Chloride 97  96 - 112 mEq/L   CO2 24  19 - 32 mEq/L   Glucose, Bld 94  70 - 99 mg/dL   BUN 8  6 - 23 mg/dL   Creatinine, Ser 0.93  0.50 - 1.35 mg/dL   Calcium 8.9  8.4 - 10.5 mg/dL   Total Protein 7.3  6.0 - 8.3 g/dL   Albumin 3.3 (*) 3.5 - 5.2 g/dL   AST 28  0 - 37 U/L   Comment: HEMOLYSIS AT THIS LEVEL MAY AFFECT RESULT   ALT 14  0 - 53 U/L   Comment: HEMOLYSIS AT THIS LEVEL MAY AFFECT RESULT   Alkaline Phosphatase 74  39 - 117 U/L   Total Bilirubin 0.3  0.3 - 1.2 mg/dL   GFR calc non Af Amer 70 (*) >90 mL/min   GFR calc Af Amer 81 (*) >90 mL/min   Comment: (NOTE)     The eGFR has been calculated using the CKD EPI equation.     This calculation has not been validated in all clinical situations.     eGFR's persistently <90 mL/min signify possible Chronic Kidney     Disease.  LACTIC ACID, PLASMA     Status: Abnormal   Collection Time    01/15/14  2:24 PM      Result Value Ref Range   Lactic Acid, Venous 2.5 (*) 0.5 - 2.2 mmol/L  PROTIME-INR     Status: Abnormal   Collection Time    01/15/14  2:24 PM      Result Value Ref Range   Prothrombin Time >90.0 (*) 11.6 - 15.2 seconds   Comment: REPEATED TO VERIFY     CRITICAL RESULT CALLED TO, READ BACK BY AND VERIFIED WITH:     DELGROSSO KELLY RN AT 0932 ON 01/15/2014 BY INGOLD L   INR >10.00 (*) 0.00 - 1.49   Comment: REPEATED TO VERIFY     CRITICAL RESULT CALLED TO, READ BACK BY AND VERIFIED WITH:     Saul Fordyce RN AT [phone]  ON 01/15/2014 BY INGOLD L  CBC WITH DIFFERENTIAL     Status: Abnormal   Collection Time    01/15/14  3:32 PM      Result Value Ref Range   WBC 6.7  4.0 - 10.5 K/uL   RBC 4.02 (*) 4.22 - 5.81 MIL/uL   Hemoglobin 11.6 (*) 13.0 - 17.0  g/dL   HCT 35.0 (*) 39.0 - 52.0 %   MCV 87.1  78.0 - 100.0 fL   MCH 28.9  26.0 - 34.0 pg   MCHC 33.1  30.0 - 36.0 g/dL   RDW 14.5  11.5 - 15.5 %   Platelets 149 (*) 150 - 400 K/uL   Neutrophils Relative % 75  43 - 77 %   Neutro Abs 5.0  1.7 - 7.7 K/uL   Lymphocytes Relative 17  12 - 46 %   Lymphs Abs 1.2  0.7 - 4.0 K/uL   Monocytes Relative 8  3 - 12 %   Monocytes Absolute 0.5  0.1 - 1.0 K/uL   Eosinophils Relative 0  0 - 5 %   Eosinophils Absolute 0.0  0.0 - 0.7 K/uL   Basophils Relative 0  0 - 1 %   Basophils Absolute 0.0  0.0 - 0.1 K/uL  PROTIME-INR     Status: None   Collection Time    01/15/14  5:25 PM      Result Value Ref Range   Prothrombin Time 14.7  11.6 - 15.2 seconds   INR 1.17  0.00 - 1.49  APTT     Status: None   Collection Time    01/15/14  5:25 PM      Result Value Ref Range   aPTT 25  24 - 37 seconds  MRSA PCR SCREENING     Status: None   Collection Time    01/15/14  6:20 PM      Result Value Ref Range   MRSA by PCR NEGATIVE  NEGATIVE   Comment:            The GeneXpert MRSA Assay (FDA     approved for NASAL specimens     only), is one component of a     comprehensive MRSA colonization     surveillance program. It is not     intended to diagnose MRSA     infection nor to guide or     monitor treatment for     MRSA infections.  TSH     Status: None   Collection Time    01/15/14  6:50 PM      Result Value Ref Range   TSH 0.840  0.350 - 4.500 uIU/mL   Comment: Performed at [facility]     Status: Abnormal   Collection Time    01/15/14  6:50 PM      Result Value Ref Range   Vitamin B-12 >2000 (*) 211 - 911 pg/mL   Comment: Performed at [facility]  TROPONIN I     Status: None   Collection Time    01/15/14  6:50 PM      Result Value Ref Range   Troponin I <0.30  <0.30 ng/mL   Comment:            Due to the release kinetics of cTnI,     a negative result within the first hours     of the onset of symptoms does  not rule out  myocardial infarction with certainty.     If myocardial infarction is still suspected,     repeat the test at appropriate intervals.  CBC     Status: Abnormal   Collection Time    01/16/14 12:55 AM      Result Value Ref Range   WBC 4.7  4.0 - 10.5 K/uL   RBC 3.80 (*) 4.22 - 5.81 MIL/uL   Hemoglobin 10.8 (*) 13.0 - 17.0 g/dL   HCT 32.6 (*) 39.0 - 52.0 %   MCV 85.8  78.0 - 100.0 fL   MCH 28.4  26.0 - 34.0 pg   MCHC 33.1  30.0 - 36.0 g/dL   RDW 14.2  11.5 - 15.5 %   Platelets 158  150 - 400 K/uL  BASIC METABOLIC PANEL     Status: Abnormal   Collection Time    01/16/14 12:55 AM      Result Value Ref Range   Sodium 134 (*) 137 - 147 mEq/L   Potassium 3.9  3.7 - 5.3 mEq/L   Comment: DELTA CHECK NOTED   Chloride 99  96 - 112 mEq/L   CO2 23  19 - 32 mEq/L   Glucose, Bld 90  70 - 99 mg/dL   BUN 7  6 - 23 mg/dL   Creatinine, Ser 0.84  0.50 - 1.35 mg/dL   Calcium 8.9  8.4 - 10.5 mg/dL   GFR calc non Af Amer 73 (*) >90 mL/min   GFR calc Af Amer 84 (*) >90 mL/min   Comment: (NOTE)     The eGFR has been calculated using the CKD EPI equation.     This calculation has not been validated in all clinical situations.     eGFR's persistently <90 mL/min signify possible Chronic Kidney     Disease.  TROPONIN I     Status: None   Collection Time    01/16/14 12:55 AM      Result Value Ref Range   Troponin I <0.30  <0.30 ng/mL   Comment:            Due to the release kinetics of cTnI,     a negative result within the first hours     of the onset of symptoms does not rule out     myocardial infarction with certainty.     If myocardial infarction is still suspected,     repeat the test at appropriate intervals.    Dg Lumbar Spine 2-3 Views  01/15/2014   CLINICAL DATA:  Lower back pain  EXAM: LUMBAR SPINE - 2-3 VIEW  COMPARISON:  09/09/2013  FINDINGS: Three views of lumbar spine submitted. There is diffuse osteopenia. Progression of compression fracture of L1 vertebral body.  Alignment and disc spaces are grossly preserved.  IMPRESSION: Progression of compression fracture of L1 vertebral body. Study is limited by diffuse osteopenia. Alignment and disc spaces are grossly preserved.   Electronically Signed   By: Lahoma Crocker M.D.   On: 01/15/2014 16:50   Ct Head Wo Contrast  01/15/2014   CLINICAL DATA:  Syncope.  Subtherapeutic INR.  Alzheimer's disease.  EXAM: CT HEAD WITHOUT CONTRAST  TECHNIQUE: Contiguous axial images were obtained from the base of the skull through the vertex without intravenous contrast.  COMPARISON:  10/18/2013.  FINDINGS: Exam is motion degraded.  No intracranial hemorrhage.  Question acute small right cerebellar/pontine infarct versus streak artifact from motion.  Remote left frontal lobe infarct.  Prominent small vessel disease type changes.  Global atrophy. Ventricular prominence felt to be related to atrophy rather than hydrocephalus and without change.  No intracranial mass lesion noted on this unenhanced exam.  Degenerative changes C1-2 level with spinal stenosis and mild cord flattening.  Degenerative changes left temporal mandibular joint.  Opacification sphenoid sinus air cells with findings suggesting chronic sinusitis as indicated by sclerotic borders of the sphenoid sinus.  Vascular calcifications.  IMPRESSION: Exam is motion degraded.  No intracranial hemorrhage.  Question acute small right cerebellar/pontine infarct versus streak artifact from motion.  Remote left frontal lobe infarct.  Prominent small vessel disease type changes.  Global atrophy.  Degenerative changes C1-2 level with spinal stenosis and mild cord flattening.  Opacification sphenoid sinus air cells.   Electronically Signed   By: Chauncey Cruel M.D.   On: 01/15/2014 17:17   Dg Chest Port 1 View  01/15/2014   CLINICAL DATA:  Altered mental status  EXAM: PORTABLE CHEST - 1 VIEW  COMPARISON:  10/18/2013  FINDINGS: Cardiomediastinal silhouette is stable. No acute infiltrate or pulmonary  edema. Stable bilateral small pulmonary nodules.  IMPRESSION: No active disease.  Stable bilateral small pulmonary nodules.   Electronically Signed   By: Lahoma Crocker M.D.   On: 01/15/2014 13:04   Assessment/Plan: 80 y/o with recurrent syncope in he context of atypical parkinsonism/PSP, brought in after sustained an unusually prolonged episode of alteration of consciousness not quite consistent with syncope. Neuro-exam is limited due to patient's underlying dementia. CT with questionable pontine infarct. Brain MRI and EEG already requested. Will follow up after completion above testing.Dorian Pod, MD 01/16/2014, 7:34 AM [facility].

## 2014-01-16 NOTE — Progress Notes (Signed)
PT Cancellation Note  Patient Details Name: Ricky [name] MRN: [id number] DOB: 17-Jul-1917   Cancelled Treatment:    Reason Eval/Treat Not Completed: Other (comment) (pt currently on strict bedrest and await increased activity orders)   Melford Aase 01/16/2014, 8:21 AM Elwyn Reach, [place]

## 2014-01-16 NOTE — Progress Notes (Signed)
Pt transferred fro 2600 to 6E 19 via bed.  Tele on, scd's in use. Pt alert and responsive. Foley Catheter in place.  Bilateral ted hose.  Some redness noted to top of left foot. Blanchable. Pleasant. Bed alarm on.

## 2014-01-16 NOTE — Progress Notes (Signed)
Utilization Review Completed.  

## 2014-01-16 NOTE — Progress Notes (Signed)
Ricky [name]  Ricky [name] ZOX:[id number] DOB: Apr 11, 1917 DOA: 01/15/2014 PCP: Hollace Kinnier, DO  Brief narrative: 80 year old male patient with an apparent history of progressive supranuclear palsy and dementia. He also has hypertension and hypothyroidism. He presented to the emergency department because of reports of altered mentation and a syncopal episode. Important to [name] that prior to this episode the patient was preparing to use the bathroom. Previously he has had syncopal episodes that were determined to be vasovagal in etiology. According to the patient's son the syncopal episode this time lasted longer than his usual 5-10 minute duration. His initial blood pressure per EMS reports was 60 over palpable.  Workup in the ER included a CT of the head. Apparently had a reported supratherapeutic INR greater than 10.0 although patient not on anticoagulation medication prior to admission and other laboratory values including liver functions were completely normal. Patient also had visible/gross hematuria after placement of indwelling Foley catheter - according to the patient's son this is an intermittent ongoing issue and he is followed by a Urologist as an outpatient. Apparently most recent episode of hematuria started on 01/12/2014 but was improving by the time the patient was sent to the emergency department.  Assessment/Plan:    Syncope/ History of vasovagal syncope -History of vasovagal syncope and admitting hx consistent with same -Appears to have resolved -Orders check orthostatic vital signs but does not appear to have been completed -Mobilize with PT -Continue IV fluids for now until proves can tolerate oral diet -Appreciate Neurology assistance: 2-D echocardiogram and MRI/MRA brain pending (CT head at admission questioned an acute small right cerebellar/pontine infarct)    Elevated INR - likely LAB ERROR  -Repeat coags completely normal so  suspect initial result was spurious    Gross hematuria -Chronic recurrent according to family and has resolved    Alzheimer disease / Parkinsonian syndrome / history supranuclear palsy -Resume Namenda -Continue home Neupro    Hypertension -Continue home medication     Dysphagia -Start with dysphagia 3 diet and if problems we'll have speech evaluate swallowing for diet appropriateness    HYPOTHYROIDISM -Continue Synthroid    PULMONARY EMBOLISM, HX OF -Not on anticoagulation prior to admission and no evidence of PE based on current history and exam    Hyperlipidemia -Continue home statin    CKD (chronic kidney disease), stage II -Renal function at baseline    Anemia, chronic disease -Continue home iron repletion   DVT prophylaxis: SCDs Code Status: Full Family Communication: No family at bedside at time of visit  Disposition Plan/Expected LOS: Transfer to telemetry  Consultants: Neurology  Procedures: 2-D echocardiogram pending  Antibiotics: None  HPI/Subjective: Patient alert. Without any specific complaints. According to nursing staff no recurrent episodes of syncope since arrival.  Objective: Blood pressure 140/77, pulse 53, temperature 98.5 F (36.9 C), temperature source Axillary, resp. rate 17, height 6\' 1"  (1.854 m), weight 152 lb 12.5 oz (69.3 kg), SpO2 96.00%.  Intake/Output Summary (Last 24 hours) at 01/16/14 1257 Last data filed at 01/16/14 1100  Gross per 24 hour  Intake 803.75 ml  Output    650 ml  Net 153.75 ml   Exam: General: No acute respiratory distress Lungs: Clear to auscultation bilaterally without wheezes or crackles, RA Cardiovascular: Regular occasionally bradycardic rate and rhythm without murmur gallop or rub normal S1 and S2, no peripheral edema or JVD Abdomen: Nontender, nondistended, soft, bowel sounds positive, no rebound, no ascites, no appreciable  mass Musculoskeletal: No significant cyanosis, clubbing of bilateral lower  extremities Neurological: Alert and oriented x name, moves all extremities x 4 without focal neurological deficits  Scheduled Meds:  Scheduled Meds: . atorvastatin  10 mg Oral q1800  . bacitracin  1 application Topical BID  . calcium-vitamin D  1 tablet Oral BID WC  . ferrous sulfate  325 mg Oral QODAY  . hydrALAZINE  12.5 mg Oral BID  . lactose free nutrition  90 mL Oral BID BM  . levothyroxine  25 mcg Oral QAC breakfast  . memantine  10 mg Oral BID  . multivitamin with minerals  1 tablet Oral Daily  . polyethylene glycol  17 g Oral Daily  . rotigotine  1 patch Transdermal Daily  . saccharomyces boulardii  250 mg Oral BID  . vitamin B-12  1,000 mcg Oral Daily   Data Reviewed: Basic Metabolic Panel:  Recent Labs Lab 01/15/14 1424 01/16/14 0055  NA 134* 134*  K 4.9 3.9  CL 97 99  CO2 24 23  GLUCOSE 94 90  BUN 8 7  CREATININE 0.93 0.84  CALCIUM 8.9 8.9   Liver Function Tests:  Recent Labs Lab 01/15/14 1424  AST 28  ALT 14  ALKPHOS 74  BILITOT 0.3  PROT 7.3  ALBUMIN 3.3*   CBC:  Recent Labs Lab 01/15/14 1532 01/16/14 0055  WBC 6.7 4.7  NEUTROABS 5.0  --   HGB 11.6* 10.8*  HCT 35.0* 32.6*  MCV 87.1 85.8  PLT 149* 158   Cardiac Enzymes:  Recent Labs Lab 01/15/14 1850 01/16/14 0055 01/16/14 0648  TROPONINI <0.30 <0.30 <0.30    Recent Results (from the past 240 hour(s))  MRSA PCR SCREENING     Status: None   Collection Time    01/15/14  6:20 PM      Result Value Ref Range Status   MRSA by PCR NEGATIVE  NEGATIVE Final   Comment:            The GeneXpert MRSA Assay (FDA     approved for NASAL specimens     only), is one component of a     comprehensive MRSA colonization     surveillance program. It is not     intended to diagnose MRSA     infection nor to guide or     monitor treatment for     MRSA infections.     Studies:  Recent x-ray studies have been reviewed in detail by the Attending Physician  Time spent : [place], ANP [facility] Office  [phone] Pager [phone]  **If unable to reach the above provider after paging please contact the [facility] @ [phone]  On-Call/Text Page:      [email or web address]      password [email or web address]  If 7PM-7AM, please contact night-coverage [email or web address] Password [email or web address] 01/16/2014, 12:57 PM   LOS: 1 day   I have personally examined this patient and reviewed the entire database. I have reviewed the above [name], made any necessary editorial changes, and agree with its content.  Cherene Altes, MD [facility]

## 2014-01-16 NOTE — Progress Notes (Signed)
  Echocardiogram 2D Echocardiogram has been performed.  Ricky [name] 01/16/2014, 10:12 AM

## 2014-01-16 NOTE — Progress Notes (Signed)
Pt off floor for MRI

## 2014-01-16 NOTE — Progress Notes (Signed)
Patient placed on telemetry box #19. Verified with Anderson Malta, CMT.   Ricky [name], Ricky [name]

## 2014-01-16 NOTE — Evaluation (Signed)
Clinical/Bedside Swallow Evaluation Patient Details  Name: Ricky [name] MRN: [id number] Date of Birth: [birth year]  Today's Date: 01/16/2014 Time: 4742-5956 SLP Time Calculation (min): 22 min  Past Medical History:  Past Medical History  Diagnosis Date  . IBS (irritable bowel syndrome)   . Diverticulosis   . Cataract   . OAB (overactive bladder)   . Macular degeneration   . Alzheimer disease   . Hypertension   . Arthritis   . Osteoporosis   . Parkinsonian syndrome   . Syncope, vasovagal   . Neuromuscular disorder     parkisonian syndrome  . 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 Unspecified transient cerebral ischemia   . Unspecified urinary incontinence   . Senile dementia, uncomplicated   . Unspecified constipation   . Hypothyroidism   . Anemia, unspecified   . Depression   . Alzheimer's disease   . Gout, unspecified   . Thoracic or lumbosacral neuritis or radiculitis, unspecified   . Progressive supranuclear palsy   . Unspecified transient cerebral ischemia   . Unspecified urinary incontinence   . Syncope and collapse   . Paralysis agitans   . Unspecified transient cerebral ischemia   . Pneumonitis due to inhalation of food or vomitus   . Cellulitis and abscess of oral soft tissues   . Screening for lipoid disorders   . Senile dementia, uncomplicated   . Unspecified constipation   . Disorder of bone and cartilage, unspecified   . Other specified acquired hypothyroidism   . Unspecified hypothyroidism   . Anemia, unspecified   . Depressive disorder, not elsewhere classified   . Alzheimer's disease   . Paralysis agitans   . Pneumonia, organism unspecified   . Pseudomonas infection in conditions classified elsewhere and of unspecified site   . Unspecified essential hypertension   . Urinary tract infection, site not specified   . Osteoarthrosis, unspecified whether generalized or localized, unspecified site   . Osteoporosis,  unspecified   . Gout, unspecified   . Thoracic or lumbosacral neuritis or radiculitis, unspecified   . Stroke    Past Surgical History:  Past Surgical History  Procedure Laterality Date  . Esophagogastroduodenoscopy    . Eye surgery  2006    LEFT CATARACT   HPI:  Ricky [name] is a 80 y.o. male with a history of progressive supranuclear palsy with dementia, hypertension, hypothyroidism that presented to the ER for altered mental status and syncopal episode not quite consistent with syncope. CT with questionable pontine infarct. 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 MBS at that time showed a moderate esophageal and a mild oropharyngeal dysphagia although able to fully protect the airway. CP bar resulted in backflow of bolus into the pyriform sinuses post swallow with inconsistent sensation. Verbal and tactile cued dry swallow successful in aiding esophageal clearance of bolus. Recommended dysphagia 3 diet with thin liquids    Assessment / Plan / Recommendation Clinical Impression  Pt demonstrates new evidence of immediate aspiration with thin liquids, new since last MBS. There is hard cough following sips of thin liquids. This may be due to documented backflow, but could also be due to decreased airway protection or increased delay in swallow. Pt with a possible pontine CVA. Trials of nectar were much better tolerated, though delayed wet vocal quality still heard, likely due to backflow to pyriforms. Discussed recommendations with RN. Dys 3 (mechanical soft) with nectar thick liquids. Will f/u for tolerance and MRI results  Aspiration Risk  Moderate    Diet Recommendation Dysphagia 3 (Mechanical Soft);Nectar-thick liquid   Liquid Administration via: Cup;Straw Medication Administration: Whole meds with puree Supervision: Staff to assist with self feeding Compensations: Slow rate;Small sips/bites Postural Changes and/or Swallow Maneuvers: Seated upright 90 degrees;Upright  30-60 min after meal    Other  Recommendations Oral Care Recommendations: Oral care BID Other Recommendations: Order thickener from pharmacy   Follow Up Recommendations  Skilled Nursing facility    Frequency and Duration min 2x/week  2 weeks   Pertinent Vitals/Pain NA    SLP Swallow Goals     Swallow Study Prior Functional Status       General HPI: Ricky [name] is a 80 y.o. male with a history of progressive supranuclear palsy with dementia, hypertension, hypothyroidism that presented to the ER for altered mental status and syncopal episode not quite consistent with syncope. CT with questionable pontine infarct. 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 MBS at that time showed a moderate esophageal and a mild oropharyngeal dysphagia although able to fully protect the airway. CP bar resulted in backflow of bolus into the pyriform sinuses post swallow with inconsistent sensation. Verbal and tactile cued dry swallow successful in aiding esophageal clearance of bolus. Recommended dysphagia 3 diet with thin liquids  Type of Study: Bedside swallow evaluation Previous Swallow Assessment: see HPI Diet Prior to this Study: Dysphagia 3 (soft);Thin liquids Temperature Spikes Noted: No Respiratory Status: Room air Behavior/Cognition: Alert;Requires cueing Oral Cavity - Dentition:  (would not let me examin mouth- dentition there ) Self-Feeding Abilities: Total assist Patient Positioning: Postural control interferes with function Baseline Vocal Quality: Clear Volitional Cough: Cognitively unable to elicit Volitional Swallow: Unable to elicit    Oral/Motor/Sensory Function Overall Oral Motor/Sensory Function: Other (comment) (cannot follow commands to examine)   Ice Chips     Thin Liquid Thin Liquid: Impaired Presentation: Cup;Straw Oral Phase Functional Implications: Prolonged oral transit Pharyngeal  Phase Impairments: Suspected delayed Swallow;Wet Vocal Quality;Cough -  Immediate    Nectar Thick Nectar Thick Liquid: Impaired Presentation: Straw;Cup Oral phase functional implications: Prolonged oral transit Pharyngeal Phase Impairments: Suspected delayed Swallow;Wet Vocal Quality (peicemeal swallow)   Honey Thick Honey Thick Liquid: Not tested   Puree Puree: Within functional limits   Solid   GO    Solid: Impaired Oral Phase Impairments:  (prolonged mastication)      Herbie Baltimore, MA CCC-SLP [phone]  Kerman Pfost, Katherene Ponto 01/16/2014,12:31 PM

## 2014-01-17 DIAGNOSIS — N182 Chronic kidney disease, stage 2 (mild): Secondary | ICD-10-CM

## 2014-01-17 DIAGNOSIS — R791 Abnormal coagulation profile: Secondary | ICD-10-CM

## 2014-01-17 DIAGNOSIS — E039 Hypothyroidism, unspecified: Secondary | ICD-10-CM

## 2014-01-17 DIAGNOSIS — E785 Hyperlipidemia, unspecified: Secondary | ICD-10-CM

## 2014-01-17 DIAGNOSIS — D638 Anemia in other chronic diseases classified elsewhere: Secondary | ICD-10-CM

## 2014-01-17 DIAGNOSIS — R1310 Dysphagia, unspecified: Secondary | ICD-10-CM

## 2014-01-17 LAB — CBC
HCT: 32 % — ABNORMAL LOW (ref 39.0–52.0)
Hemoglobin: 10.7 g/dL — ABNORMAL LOW (ref 13.0–17.0)
MCH: 28.8 pg (ref 26.0–34.0)
MCHC: 33.4 g/dL (ref 30.0–36.0)
MCV: 86.3 fL (ref 78.0–100.0)
PLATELETS: 147 10*3/uL — AB (ref 150–400)
RBC: 3.71 MIL/uL — AB (ref 4.22–5.81)
RDW: 14.3 % (ref 11.5–15.5)
WBC: 4.3 10*3/uL (ref 4.0–10.5)

## 2014-01-17 MED ORDER — UNABLE TO FIND: Status: DC

## 2014-01-17 NOTE — Evaluation (Signed)
Physical Therapy Evaluation Patient Details Name: Ricky [name] MRN: [id number] DOB: 1916/12/21 Today's Date: 01/17/2014 Time: 0730-0750 PT Time Calculation (min): 20 min  PT Assessment / Plan / Recommendation History of Present Illness  Ricky [name] is an 80 y.o. male, right handed with multiple medical problems including HTN,  PSP, dementia, hypothyroidism, brought to [facility] ED due to prolonged altered mental status and syncope  Clinical Impression  Pt currently total A with mobility and transfers, limited by rigidity, decreased strength and impaired cognition.  Pt will benefit from skilled PT services to address deficits and increase functional mobility.    PT Assessment  Patient needs continued PT services    Follow Up Recommendations  SNF;Outpatient PT;Supervision/Assistance - 24 hour (spoke with pt's daughter: she states they are not interested in SNF despite being told her father is total A, she states they are to start outpatient PT on Monday)    Does the patient have the potential to tolerate intense rehabilitation      Barriers to Discharge        Equipment Recommendations  None recommended by PT    Recommendations for Other Services OT consult   Frequency Min 3X/week    Precautions / Restrictions Precautions Precautions: Fall Restrictions Weight Bearing Restrictions: No   Pertinent Vitals/Pain No c/o pain      Mobility  Bed Mobility Overal bed mobility: Needs Assistance Bed Mobility: Supine to Sit Supine to sit: Total assist General bed mobility comments: pt unable to assist to lift LEs, UEs or trunk Transfers Overall transfer level: Needs assistance Transfers: Stand Pivot Transfers Stand pivot transfers: Total assist General transfer comment: Pt unable to assist at all through UEs or LEs, total A for transfer due to rigidity and weakness    Exercises     PT Diagnosis: Difficulty walking;Generalized weakness  PT Problem List: Decreased  strength;Decreased mobility;Decreased range of motion;Decreased balance;Decreased activity tolerance;Decreased knowledge of use of DME;Decreased knowledge of precautions PT Treatment Interventions: DME instruction;Therapeutic exercise;Wheelchair mobility training;Balance training;Gait training;Stair training;Neuromuscular re-education;Modalities;Functional mobility training;Therapeutic activities;Patient/family education     PT Goals(Current goals can be found in the care plan section) Acute Rehab PT Goals Patient Stated Goal: unable to state PT Goal Formulation: Patient unable to participate in goal setting Time For Goal Achievement: 01/24/14 Potential to Achieve Goals: Good  Visit Information  Last PT Received On: 01/17/14 Assistance Needed: +1 History of Present Illness: Ricky [name] is an 80 y.o. male, right handed with multiple medical problems including HTN,  PSP, dementia, hypothyroidism, brought to [facility] ED due to prolonged altered mental status and syncope       Prior [place] expects to be discharged to:: Private residence Living Arrangements: Children Available Help at Discharge: Family Additional Comments: unable to obtain history or prior level of function from the patient and no caregiver available. Prior Function Comments: unclear of PLOF due to no family present, adn pt not clear with questions asked.  Communication Communication: HOH    Cognition  Cognition Arousal/Alertness: Lethargic Behavior During Therapy: Flat affect Overall Cognitive Status: No family/caregiver present to determine baseline cognitive functioning    Extremity/Trunk Assessment Upper Extremity Assessment Upper Extremity Assessment: Generalized weakness Lower Extremity Assessment Lower Extremity Assessment: Generalized weakness (pt with decreased ROM throughout extremeties) Cervical / Trunk Assessment Cervical / Trunk Assessment: Kyphotic   Balance  Balance Overall balance assessment: Needs assistance Sitting balance-Leahy Scale: Poor Sitting balance - Comments: min A for sitting EOB  End of Session PT -  End of Session Equipment Utilized During Treatment: Gait belt Activity Tolerance: Patient limited by fatigue;Patient limited by lethargy Patient left: in chair;with call bell/phone within reach Nurse Communication: Mobility status;Need for lift equipment  GP     Jobeth Pangilinan 01/17/2014, 10:59 AM

## 2014-01-17 NOTE — Progress Notes (Signed)
Patient was discharged home with care taker. Patients daughter and son were notified of patients discharge. Patient was discharged with belongings. Care taker was given discharge paperwork. Patient was stable upon discharge.

## 2014-01-17 NOTE — Discharge Summary (Signed)
Physician Discharge Summary  Ricky [name]:[id number] DOB: November 09, 1917 DOA: 01/15/2014  PCP: Hollace Kinnier, DO  Admit date: 01/15/2014 Discharge date: 01/17/2014  Time spent: 35 minutes  Recommendations for Outpatient Follow-up:  Patient will be discharged to home. Physical therapy did recommend nursing home or therapy. Family opted for outpatient physical therapy. Patient to continue followup with his primary care physician. He should continue his medications as prescribed. Patient should follow a dysphagia 3 diet.  Discharge Diagnoses:  Syncope, with history of vasovagal syncope Elevated INR, left ear Gross hematuria with indwelling Foley catheter Parkinson's dementia/supranuclear gaze palsy Back pain Hypothyroidism Hypertension CKD Stage II Hyperlipidemia Low back pain Recent C. difficile colitis Insomnia History of pulmonary embolism 2012 Anemia chronic disease  Discharge Condition: Stable  Diet recommendation: Dysphagia 3 diet  Filed Weights   01/15/14 1820 01/16/14 0435 01/16/14 2135  Weight: 69.3 kg (152 lb 12.5 oz) 69.3 kg (152 lb 12.5 oz) 68.1 kg (150 lb 2.1 oz)    History of present illness:  Ricky [name] is a 80 y.o. male  With a history of progressive supranuclear palsy with dementia, hypertension, hypothyroidism that presented to the ER for altered mental status and syncopal episode. Patient's on stated that this has happened in the past however, episodes usually resolve within 5-10 minutes. Patient has had about 20 episodes in the past few years. Traditionally his blood pressure drops, he becomes sweaty, with a tremor of the hand. However, today, there was no sweating or tremors. Episode lasted about 15-20 minutes. Of note, patient has had a holter monitor in the past. He sees Dr. Harrington Challenger. The holter monitor did not show any cardiac arrhythmias in 2013. At this time, per the son, he has not been ill recently, he has not had any sick contacts, no recent  travel.  Hospital Course:  Syncope/ History of vasovagal syncope  -History of vasovagal syncope and admitting hx consistent with same  -Appears to have resolved  -Orders check orthostatic vital signs but does not appear to have been completed  -Mobilize with PT  -Continue IV fluids for now until proves can tolerate oral diet  -Appreciate Neurology assistance: 2-D echocardiogram and MRI/MRA brain pending (CT head at admission questioned an acute small right cerebellar/pontine infarct)   Elevated INR - likely LAB ERROR  -Repeat coags completely normal so suspect initial result was spurious   Gross hematuria  -Chronic recurrent according to family and has resolved   Dementia / Parkinsonian syndrome / Supranuclear gaze palsy -Resume Namenda  -Patient's family does not want to continue Neupro (home medication)  Lower back pain -Continue lidoderm patch and pain control -Xray of lumbar spine showed Progression of compression fracture of L1 vertebral body. Study is limited by diffuse osteopenia.   Hypertension  -Continue home medication, hydralazine   Dysphagia  -Start with dysphagia 3 diet and if problems we'll have speech evaluate swallowing for diet appropriateness   Hypothyroidism -Continue Synthroid   History of pulmonary embolism -Not on anticoagulation prior to admission and no evidence of PE based on current history and exam   Hyperlipidemia  -Continue home statin   CKD (chronic kidney disease), stage II  -Renal function at baseline   Anemia, chronic disease  -Continue home iron repletion  Recent C Diff colitis  -In January 2015, will continue florastor and align.  Insomnia  -Continue melatonin  Procedures: Echocardiogram 01/16/14 Study Conclusions - Left ventricle: Systolic function was normal. The estimated ejection fraction was in the range of 55% to  60%. - Aortic valve: Mild regurgitation. - Atrial septum: No defect or patent foramen ovale was identified. -  Pulmonary arteries: PA peak pressure: 40mm Hg (S).  Consultations: Neurology  Discharge Exam: Filed Vitals:   01/17/14 0919  BP: 175/86  Pulse: 84  Temp: 97.2 F (36.2 C)  Resp: 18   General: Well developed, malnourished, NAD, appears stated age  HEENT: NCAT, PERRLA, EOMI, Anicteic Sclera, mucous membranes moist.  Neck: Supple, no JVD, no masses  Cardiovascular: S1 S2 auscultated, Regular rate and rhythm.  Respiratory: Clear to auscultation bilaterally with equal chest rise  Abdomen: Soft, nontender, nondistended, + bowel sounds  GU: Foley catheter in place with gross hematuria  Extremities: warm dry without cyanosis clubbing or edema. Upper extremity contractures.  Neuro: Awake, alert, oriented to self and place, however, not time.  Follows commands. Skin: Without rashes exudates or nodules  Psych: Unable to assess. Pleasant.  Discharge Instructions      Discharge Orders   Future Appointments Provider Department Dept Phone   01/23/2014 10:15 AM [place] [place] [phone]   03/02/2014 8:30 AM [facility] [phone]   04/03/2014 8:30 AM Fay Records, MD [place] Office [phone]   05/26/2014 11:30 AM Star Age, MD [facility] [phone]   Future Orders Complete By Expires   Discharge instructions  As directed    Comments:     Patient will be discharged to home. Physical therapy did recommend nursing home or therapy. Family opted for outpatient physical therapy. Patient to continue followup with his primary care physician. He should continue his medications as prescribed. Patient should follow a dysphagia 3 diet.   Increase activity slowly  As directed        Medication List    STOP taking these medications       ICAPS AREDS FORMULA PO     rotigotine 2 MG/24HR  Commonly known as:  NEUPRO      TAKE these medications       acetaminophen 500  MG tablet  Commonly known as:  TYLENOL  Take 500 mg by mouth 3 (three) times daily.     aspirin 325 MG EC tablet  Take 1 tablet (325 mg total) by mouth daily.     atorvastatin 10 MG tablet  Commonly known as:  LIPITOR  Take 1 tablet (10 mg total) by mouth daily at 6 PM.     bacitracin ointment  Apply 1 application topically 2 (two) times daily. Applied to tip of penis for skin tear.     bifidobacterium infantis capsule  TAKE ONE CAPSULE BY MOUTH TWICE A DAY FOR 14 DAYS     CALTRATE 600+D PLUS MINERALS 600-800 MG-UNIT Chew  Chew 1 tablet by mouth 2 (two) times daily with a meal.     CRANBERRY PO  Take 4,200 mg by mouth 3 (three) times daily.     D-Mannose Powd  Take 2,000 mg by mouth 2 (two) times daily.     ferrous sulfate 325 (65 FE) MG tablet  Take 325 mg by mouth every other day.     guaiFENesin 600 MG 12 hr tablet  Commonly known as:  MUCINEX  Take 1,200 mg by mouth 2 (two) times daily as needed for congestion.     hydrALAZINE 25 MG tablet  Commonly known as:  APRESOLINE  Take 0.5 tablets (12.5 mg total) by mouth 2 (two) times daily.  lactose free nutrition Liqd  Take 90 mLs by mouth 2 (two) times daily between meals.     levothyroxine 25 MCG tablet  Commonly known as:  SYNTHROID, LEVOTHROID  Take 25 mcg by mouth daily before breakfast.     lidocaine 5 %  Commonly known as:  LIDODERM  Place 1 patch onto the skin daily as needed (for back pain). Remove & Discard patch within 12 hours or as directed by MD     MELATONIN EXTRA STRENGTH PO  Take 15 mLs by mouth at bedtime as needed (sleep).     NAMENDA 10 MG tablet  Generic drug:  memantine  TAKE 1 TABLET BY MOUTH TWICE A DAY TO PRESERVE MEMORY     memantine 10 MG tablet  Commonly known as:  NAMENDA  Take 10 mg by mouth 2 (two) times daily.     multivitamin with minerals Tabs tablet  Take 1 tablet by mouth daily.     polyethylene glycol packet  Commonly known as:  MIRALAX / GLYCOLAX  Take 17 g by  mouth daily.     saccharomyces boulardii 250 MG capsule  Commonly known as:  FLORASTOR  Take 1 capsule (250 mg total) by mouth 2 (two) times daily.     UNABLE TO FIND  - Outpatient physical therapy  - Supranuclear palsy, Parkinson's  - Syncope     Vitamin B-12 2500 MCG Subl  Place 5,000 mcg under the tongue daily.       Allergies  Allergen Reactions  . Other Other (See Comments)    Dairy products- cause swallowing difficulty    Follow-up Information   Follow up with REED, TIFFANY, DO In 1 week. [facility] follow up)    Specialty:  Geriatric Medicine   Contact information:   [address] [place] [address] [phone]        The results of significant diagnostics from this hospitalization (including imaging, microbiology, ancillary and laboratory) are listed below for reference.    Significant Diagnostic Studies: Dg Lumbar Spine 2-3 Views  01/15/2014   CLINICAL DATA:  Lower back pain  EXAM: LUMBAR SPINE - 2-3 VIEW  COMPARISON:  09/09/2013  FINDINGS: Three views of lumbar spine submitted. There is diffuse osteopenia. Progression of compression fracture of L1 vertebral body. Alignment and disc spaces are grossly preserved.  IMPRESSION: Progression of compression fracture of L1 vertebral body. Study is limited by diffuse osteopenia. Alignment and disc spaces are grossly preserved.   Electronically Signed   By: Lahoma Crocker M.D.   On: 01/15/2014 16:50   Ct Head Wo Contrast  01/15/2014   CLINICAL DATA:  Syncope.  Subtherapeutic INR.  Alzheimer's disease.  EXAM: CT HEAD WITHOUT CONTRAST  TECHNIQUE: Contiguous axial images were obtained from the base of the skull through the vertex without intravenous contrast.  COMPARISON:  10/18/2013.  FINDINGS: Exam is motion degraded.  No intracranial hemorrhage.  Question acute small right cerebellar/pontine infarct versus streak artifact from motion.  Remote left frontal lobe infarct.  Prominent small vessel disease type changes.  Global  atrophy. Ventricular prominence felt to be related to atrophy rather than hydrocephalus and without change.  No intracranial mass lesion noted on this unenhanced exam.  Degenerative changes C1-2 level with spinal stenosis and mild cord flattening.  Degenerative changes left temporal mandibular joint.  Opacification sphenoid sinus air cells with findings suggesting chronic sinusitis as indicated by sclerotic borders of the sphenoid sinus.  Vascular calcifications.  IMPRESSION: Exam is motion degraded.  No  intracranial hemorrhage.  Question acute small right cerebellar/pontine infarct versus streak artifact from motion.  Remote left frontal lobe infarct.  Prominent small vessel disease type changes.  Global atrophy.  Degenerative changes C1-2 level with spinal stenosis and mild cord flattening.  Opacification sphenoid sinus air cells.   Electronically Signed   By: Chauncey Cruel M.D.   On: 01/15/2014 17:17   Mr Jodene Nam Head Wo Contrast  01/16/2014   CLINICAL DATA:  Altered mental status and syncope.  EXAM: MRI HEAD WITHOUT CONTRAST  MRA HEAD WITHOUT CONTRAST  TECHNIQUE: Multiplanar, multiecho pulse sequences of the brain and surrounding structures were obtained without intravenous contrast. Angiographic images of the head were obtained using MRA technique without contrast.  COMPARISON:  Head CT 01/15/2014.  Brain MRI/ MRA 06/21/2012.  FINDINGS: MRI HEAD FINDINGS  Evaluation is moderately limited by motion artifact and patient's kyphosis. There is no evidence of acute infarct. There is no evidence of intracranial hemorrhage. There is moderate cerebral atrophy. Patchy and confluent periventricular white matter T2 hyperintensities are again seen and compatible with mild to moderate chronic small vessel ischemic disease. There is no evidence of mass, midline shift, or extra-axial fluid collection. Prior bilateral cataract surgery is noted. Sphenoid sinus mucosal thickening is present.  MRA HEAD FINDINGS  Images are moderately  to severely degraded by motion. The visualized distal left vertebral artery appears patent. The proximal intracranial right vertebral artery appears patent, and there is possible subsequent loss of flow related enhancement more distally which is similar in appearance to the prior MRA. The basilar artery is patent without evidence of high-grade stenosis. Proximal PCAs are patent without evidence of flow limiting stenosis. More distal PCA branches are not well evaluated.  Internal carotid arteries are grossly patent from skullbase to carotid termini, although evaluation of the cavernous carotids is severely limited by motion. There is mild irregularity of the A1 segments bilaterally versus artifact. Proximal ACAs and MCAs are otherwise unremarkable. MCA branches are not well evaluated due to artifact but are grossly patent.  IMPRESSION: 1. Suboptimal examination as above.  No evidence of acute infarct. 2. Mild to moderate chronic small vessel ischemic disease and moderate cerebral atrophy. 3. No definite evidence of large vessel intracranial arterial occlusion. Suboptimal evaluation of branch vessels as above.   Electronically Signed   By: Logan Bores   On: 01/16/2014 19:34   Mr Brain Wo Contrast  01/16/2014   CLINICAL DATA:  Altered mental status and syncope.  EXAM: MRI HEAD WITHOUT CONTRAST  MRA HEAD WITHOUT CONTRAST  TECHNIQUE: Multiplanar, multiecho pulse sequences of the brain and surrounding structures were obtained without intravenous contrast. Angiographic images of the head were obtained using MRA technique without contrast.  COMPARISON:  Head CT 01/15/2014.  Brain MRI/ MRA 06/21/2012.  FINDINGS: MRI HEAD FINDINGS  Evaluation is moderately limited by motion artifact and patient's kyphosis. There is no evidence of acute infarct. There is no evidence of intracranial hemorrhage. There is moderate cerebral atrophy. Patchy and confluent periventricular white matter T2 hyperintensities are again seen and  compatible with mild to moderate chronic small vessel ischemic disease. There is no evidence of mass, midline shift, or extra-axial fluid collection. Prior bilateral cataract surgery is noted. Sphenoid sinus mucosal thickening is present.  MRA HEAD FINDINGS  Images are moderately to severely degraded by motion. The visualized distal left vertebral artery appears patent. The proximal intracranial right vertebral artery appears patent, and there is possible subsequent loss of flow related enhancement more distally which is  similar in appearance to the prior MRA. The basilar artery is patent without evidence of high-grade stenosis. Proximal PCAs are patent without evidence of flow limiting stenosis. More distal PCA branches are not well evaluated.  Internal carotid arteries are grossly patent from skullbase to carotid termini, although evaluation of the cavernous carotids is severely limited by motion. There is mild irregularity of the A1 segments bilaterally versus artifact. Proximal ACAs and MCAs are otherwise unremarkable. MCA branches are not well evaluated due to artifact but are grossly patent.  IMPRESSION: 1. Suboptimal examination as above.  No evidence of acute infarct. 2. Mild to moderate chronic small vessel ischemic disease and moderate cerebral atrophy. 3. No definite evidence of large vessel intracranial arterial occlusion. Suboptimal evaluation of branch vessels as above.   Electronically Signed   By: Logan Bores   On: 01/16/2014 19:34   Dg Chest Port 1 View  01/15/2014   CLINICAL DATA:  Altered mental status  EXAM: PORTABLE CHEST - 1 VIEW  COMPARISON:  10/18/2013  FINDINGS: Cardiomediastinal silhouette is stable. No acute infiltrate or pulmonary edema. Stable bilateral small pulmonary nodules.  IMPRESSION: No active disease.  Stable bilateral small pulmonary nodules.   Electronically Signed   By: Lahoma Crocker M.D.   On: 01/15/2014 13:04    Microbiology: Recent Results (from the past 240 hour(s))   MRSA PCR SCREENING     Status: None   Collection Time    01/15/14  6:20 PM      Result Value Ref Range Status   MRSA by PCR NEGATIVE  NEGATIVE Final   Comment:            The GeneXpert MRSA Assay (FDA     approved for NASAL specimens     only), is one component of a     comprehensive MRSA colonization     surveillance program. It is not     intended to diagnose MRSA     infection nor to guide or     monitor treatment for     MRSA infections.     Labs: Basic Metabolic Panel:  Recent Labs Lab 01/15/14 1424 01/16/14 0055  NA 134* 134*  K 4.9 3.9  CL 97 99  CO2 24 23  GLUCOSE 94 90  BUN 8 7  CREATININE 0.93 0.84  CALCIUM 8.9 8.9   Liver Function Tests:  Recent Labs Lab 01/15/14 1424  AST 28  ALT 14  ALKPHOS 74  BILITOT 0.3  PROT 7.3  ALBUMIN 3.3*   No results found for this basename: LIPASE, AMYLASE,  in the last 168 hours No results found for this basename: AMMONIA,  in the last 168 hours CBC:  Recent Labs Lab 01/15/14 1532 01/16/14 0055 01/17/14 0450  WBC 6.7 4.7 4.3  NEUTROABS 5.0  --   --   HGB 11.6* 10.8* 10.7*  HCT 35.0* 32.6* 32.0*  MCV 87.1 85.8 86.3  PLT 149* 158 147*   Cardiac Enzymes:  Recent Labs Lab 01/15/14 1850 01/16/14 0055 01/16/14 0648  TROPONINI <0.30 <0.30 <0.30   BNP: BNP (last 3 results) No results found for this basename: PROBNP,  in the last 8760 hours CBG: No results found for this basename: GLUCAP,  in the last 168 hours     Signed:  Cristal Ford  [facility] 01/17/2014, 11:37 AM

## 2014-01-17 NOTE — Progress Notes (Signed)
Speech Language Pathology Treatment: Dysphagia  Patient Details Name: Ricky [name] MRN: [id number] DOB: Aug 13, 1917 Today's Date: 01/17/2014 Time: 1062-6948 SLP Time Calculation (min): 19 min  Assessment / Plan / Recommendation Clinical Impression  Pt demonstrated tolerance of thin liquids over 8 oz trials. No coughing noted in contrast with yesterday. Given negative finding on MRI, will upgrade pt back to thin liquids and mechnical soft solids. Recommend no straws. Will sign off as no further intervention needed at this time.    HPI HPI: Ricky [name] is a 80 y.o. male with a history of progressive supranuclear palsy with dementia, hypertension, hypothyroidism that presented to the ER for altered mental status and syncopal episode not quite consistent with syncope. CT with questionable pontine infarct. 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 MBS at that time showed a moderate esophageal and a mild oropharyngeal dysphagia although able to fully protect the airway. CP bar resulted in backflow of bolus into the pyriform sinuses post swallow with inconsistent sensation. Verbal and tactile cued dry swallow successful in aiding esophageal clearance of bolus. Recommended dysphagia 3 diet with thin liquids    Pertinent Vitals NA  SLP Plan  Discharge SLP treatment due to (comment)    Recommendations Diet recommendations: Thin liquid;Dysphagia 3 (mechanical soft) Liquids provided via: Cup;No straw Medication Administration: Whole meds with puree Supervision: Staff to assist with self feeding Compensations: Slow rate;Small sips/bites Postural Changes and/or Swallow Maneuvers: Seated upright 90 degrees;Upright 30-60 min after meal              Oral Care Recommendations: Oral care BID Follow up Recommendations: Skilled Nursing facility Plan: Discharge SLP treatment due to (comment)    GO      [name], Katherene Ponto 01/17/2014, 2:26 PM

## 2014-01-17 NOTE — Discharge Instructions (Signed)
Syncope  Syncope is a fainting spell. This means the person loses consciousness and drops to the ground. The person is generally unconscious for less than 5 minutes. The person may have some muscle twitches for up to 15 seconds before waking up and returning to normal. Syncope occurs more often in elderly people, but it can happen to anyone. While most causes of syncope are not dangerous, syncope can be a sign of a serious medical problem. It is important to seek medical care.   CAUSES   Syncope is caused by a sudden decrease in blood flow to the brain. The specific cause is often not determined. Factors that can trigger syncope include:   Taking medicines that lower blood pressure.   Sudden changes in posture, such as standing up suddenly.   Taking more medicine than prescribed.   Standing in one place for too long.   Seizure disorders.   Dehydration and excessive exposure to heat.   Low blood sugar (hypoglycemia).   Straining to have a bowel movement.   Heart disease, irregular heartbeat, or other circulatory problems.   Fear, emotional distress, seeing blood, or severe pain.  SYMPTOMS   Right before fainting, you may:   Feel dizzy or lightheaded.   Feel nauseous.   See all white or all black in your field of vision.   Have cold, clammy skin.  DIAGNOSIS   Your caregiver will ask about your symptoms, perform a physical exam, and perform electrocardiography (ECG) to record the electrical activity of your heart. Your caregiver may also perform other heart or blood tests to determine the cause of your syncope.  TREATMENT   In most cases, no treatment is needed. Depending on the cause of your syncope, your caregiver may recommend changing or stopping some of your medicines.  HOME CARE INSTRUCTIONS   Have someone stay with you until you feel stable.   Do not drive, operate machinery, or play sports until your caregiver says it is okay.   Keep all follow-up appointments as directed by your  caregiver.   Lie down right away if you start feeling like you might faint. Breathe deeply and steadily. Wait until all the symptoms have passed.   Drink enough fluids to keep your urine clear or pale yellow.   If you are taking blood pressure or heart medicine, get up slowly, taking several minutes to sit and then stand. This can reduce dizziness.  SEEK IMMEDIATE MEDICAL CARE IF:    You have a severe headache.   You have unusual pain in the chest, abdomen, or back.   You are bleeding from the mouth or rectum, or you have black or tarry stool.   You have an irregular or very fast heartbeat.   You have pain with breathing.   You have repeated fainting or seizure-like jerking during an episode.   You faint when sitting or lying down.   You have confusion.   You have difficulty walking.   You have severe weakness.   You have vision problems.  If you fainted, call your local emergency services (911 in [place] Do not drive yourself to the hospital.   MAKE SURE YOU:   Understand these instructions.   Will watch your condition.   Will get help right away if you are not doing well or get worse.  Document Released: 11/03/2005 Document Revised: 05/04/2012 Document Reviewed: 01/02/2012  [employer] Patient Information 2014 [employer]

## 2014-01-23 ENCOUNTER — Ambulatory Visit: Payer: Medicare HMO | Admitting: Physical Therapy

## 2014-01-24 ENCOUNTER — Encounter: Payer: Self-pay | Admitting: Internal Medicine

## 2014-01-24 ENCOUNTER — Ambulatory Visit (INDEPENDENT_AMBULATORY_CARE_PROVIDER_SITE_OTHER): Payer: Medicare HMO | Admitting: Internal Medicine

## 2014-01-24 ENCOUNTER — Ambulatory Visit
Admission: RE | Admit: 2014-01-24 | Discharge: 2014-01-24 | Disposition: A | Discharge status: Home / Self Care | Payer: Medicare HMO | Source: Ambulatory Visit | Attending: Internal Medicine | Admitting: Internal Medicine

## 2014-01-24 ENCOUNTER — Telehealth: Payer: Self-pay

## 2014-01-24 VITALS — BP 134/70 | HR 64 | Temp 98.0°F

## 2014-01-24 DIAGNOSIS — F0280 Dementia in other diseases classified elsewhere without behavioral disturbance: Secondary | ICD-10-CM

## 2014-01-24 DIAGNOSIS — G309 Alzheimer's disease, unspecified: Secondary | ICD-10-CM

## 2014-01-24 DIAGNOSIS — G9340 Encephalopathy, unspecified: Secondary | ICD-10-CM

## 2014-01-24 DIAGNOSIS — R1310 Dysphagia, unspecified: Secondary | ICD-10-CM

## 2014-01-24 LAB — CBC WITH DIFFERENTIAL/PLATELET
BASOS: 0 %
Basophils Absolute: 0 10*3/uL (ref 0.0–0.2)
EOS ABS: 0.1 10*3/uL (ref 0.0–0.4)
Eos: 1 %
HEMATOCRIT: 33.6 % — AB (ref 37.5–51.0)
HEMOGLOBIN: 11.7 g/dL — AB (ref 12.6–17.7)
LYMPHS ABS: 1.4 10*3/uL (ref 0.7–3.1)
LYMPHS: 18 %
MCH: 28.5 pg (ref 26.6–33.0)
MCHC: 34.8 g/dL (ref 31.5–35.7)
MCV: 82 fL (ref 79–97)
Monocytes Absolute: 0.8 10*3/uL (ref 0.1–0.9)
Monocytes: 11 %
NEUTROS ABS: 5.3 10*3/uL (ref 1.4–7.0)
Neutrophils Relative %: 70 %
RBC: 4.11 x10E6/uL — ABNORMAL LOW (ref 4.14–5.80)
RDW: 13.4 % (ref 12.3–15.4)
WBC: 7.7 10*3/uL (ref 3.4–10.8)

## 2014-01-24 LAB — COMPREHENSIVE METABOLIC PANEL
ALBUMIN: 4 g/dL (ref 3.2–4.6)
ALT: 12 IU/L (ref 0–44)
AST: 19 IU/L (ref 0–40)
Albumin/Globulin Ratio: 1.1 (ref 1.1–2.5)
Alkaline Phosphatase: 79 IU/L (ref 39–117)
BUN/Creatinine Ratio: 11 (ref 10–22)
BUN: 9 mg/dL — AB (ref 10–36)
CALCIUM: 9.2 mg/dL (ref 8.6–10.2)
CO2: 27 mmol/L (ref 18–29)
CREATININE: 0.81 mg/dL (ref 0.76–1.27)
Chloride: 99 mmol/L (ref 96–108)
GFR calc Af Amer: 88 mL/min/{1.73_m2} (ref >59)
GFR, EST NON AFRICAN AMERICAN: 76 mL/min/{1.73_m2} (ref >59)
GLOBULIN, TOTAL: 3.6 g/dL (ref 1.5–4.5)
Glucose: 89 mg/dL (ref 65–99)
Potassium: 4.4 mmol/L (ref 3.5–5.2)
Sodium: 136 mmol/L (ref 134–144)
Total Bilirubin: 0.4 mg/dL (ref 0.0–1.2)
Total Protein: 7.6 g/dL (ref 6.0–8.5)

## 2014-01-24 LAB — POCT URINALYSIS DIPSTICK
Bilirubin, UA: NEGATIVE
GLUCOSE UA: NEGATIVE
Ketones, UA: NEGATIVE
NITRITE UA: NEGATIVE
Protein, UA: 300
SPEC GRAV UA: 1.01
UROBILINOGEN UA: 0.2
pH, UA: 6.5

## 2014-01-24 MED ORDER — SULFAMETHOXAZOLE-TMP DS 800-160 MG PO TABS: 1.0000 | ORAL_TABLET | Freq: Two times a day (BID) | ORAL | Status: DC

## 2014-01-24 NOTE — Progress Notes (Signed)
Patient ID: Ricky [name], male   DOB: 1916/12/01, 80 y.o.   MRN: [id number]    Chief Complaint  Patient presents with  . Acute Visit    change in mental status, cough  . Hospitalization Follow-up   Allergies  Allergen Reactions  . Other Other (See Comments)    Dairy products- cause swallowing difficulty    HPI 80 y/o male patient is here with his son and daughter for acute visit. He was in the hospital from 01/15/14- 01/17/14 with vasovagal syncope. He was then advised to be discharged to SNF for STR but family opted on bringing him home and providing home health services. He was doing well until 2 days back when he started declining again. His po intake has decreased and he has become lethargic. 2 days back he was minimally responsive and in bed whole day. Today he is out of bed and responding to questions in the office but is slow to response. He has hx of supranuclear gaze palsy with dementia and parkinson syndrome. He has been coughing for last 2 days and his urine has been more concentrated. He has chronic indwelling foley cathter  Review of Systems  Constitutional: low grade temp of 98.8. No chills. Has malaise HENT: Negative for congestion, hearing loss and sore throat.   Eyes: Negative for blurred vision, discharge.  Respiratory: Negative for sputum production, shortness of breath and wheezing.   Cardiovascular: Negative for chest pain, palpitations, orthopnea and leg swelling.  Gastrointestinal: Negative for heartburn, nausea, vomiting, abdominal pain, diarrhea and constipation. bowel movement this am. Recurrent c.diff colitis history Genitourinary: has indwelling foley catheter Musculoskeletal: Negative for back pain, falls, joint pain and myalgias.  Skin: Negative for itching and rash.  Neurological: Positive for weakness Psychiatric/Behavioral: has memory loss   CBC    Component Value Date/Time   WBC 4.3 01/17/2014 0450   WBC 5.6 11/03/2013 0915   RBC 3.71* 01/17/2014 0450    RBC 3.89* 11/03/2013 0915   HGB 10.7* 01/17/2014 0450   HCT 32.0* 01/17/2014 0450   PLT 147* 01/17/2014 0450   MCV 86.3 01/17/2014 0450   MCH 28.8 01/17/2014 0450   MCH 28.0 11/03/2013 0915   MCHC 33.4 01/17/2014 0450   MCHC 32.0 11/03/2013 0915   RDW 14.3 01/17/2014 0450   RDW 15.1 11/03/2013 0915   LYMPHSABS 1.2 01/15/2014 1532   LYMPHSABS 1.4 11/03/2013 0915   MONOABS 0.5 01/15/2014 1532   EOSABS 0.0 01/15/2014 1532   EOSABS 0.1 11/03/2013 0915   BASOSABS 0.0 01/15/2014 1532   BASOSABS 0.0 11/03/2013 0915    CMP     Component Value Date/Time   NA 134* 01/16/2014 0055   NA 139 11/03/2013 0915   K 3.9 01/16/2014 0055   CL 99 01/16/2014 0055   CO2 23 01/16/2014 0055   GLUCOSE 90 01/16/2014 0055   GLUCOSE 77 11/03/2013 0915   BUN 7 01/16/2014 0055   BUN 7* 11/03/2013 0915   CREATININE 0.84 01/16/2014 0055   CALCIUM 8.9 01/16/2014 0055   PROT 7.3 01/15/2014 1424   PROT 7.6 02/10/2013 0825   ALBUMIN 3.3* 01/15/2014 1424   AST 28 01/15/2014 1424   ALT 14 01/15/2014 1424   ALKPHOS 74 01/15/2014 1424   BILITOT 0.3 01/15/2014 1424   GFRNONAA 73* 01/16/2014 0055   GFRAA 84* 01/16/2014 0055   Lab Results  Component Value Date   INR 1.17 01/15/2014   INR >10.00* 01/15/2014   INR 1.18 01/27/2013  .   Assessment/plan  1. Dysphagia Patient taking mechanical soft food and thickened liquid at home. No cough on bedside swallowing of fluid but given his history of dysphagia and change of mental status, will get cxr to rule out aspiration pneumonitis. Aspiration precaution at home - CMP - POCT urinalysis dipstick  2. ALZHEIMERS DISEASE The worsening of his mental status could be from progression of his alzheimers. Continue namenda for now. Ruling out infection - see below - POCT urinalysis dipstick  3. Acute encephalopathy Acute change of mental status with cough, concentrated urine, decreased po intake in pt who was recently in the hospital. Will rule out uti and aspiration pneumonitis given his aspiration risk and chronic  urinary retention history. Given his hx of recurrent c.diff per family and new change in odor of stool that family experienced while cleaning him, will send his stool sample for c.diff PCR. Encourage hydration for now. Advised to notify if has worsening symptoms - CMP - CBC with Differential - DG Chest 2 View; Future - Culture, Urine - C. difficile, PCR - POCT urinalysis dipstick

## 2014-01-24 NOTE — Telephone Encounter (Signed)
Spoke with patient's son, Mr.Green verbalized understanding of results. Rx was sent to [facility]

## 2014-01-24 NOTE — Telephone Encounter (Signed)
Message copied by Logan Bores on Tue Jan 24, 2014  2:46 PM ------      Message from: Blanchie Serve      Created: Tue Jan 24, 2014  2:27 PM       Your chest xray is not suggestive of pneumonia or fluid overload. your blood work does not show any concerning abnormality. Your urine appears dirty with white blood cells present suggestive of infection. Will start him on bactrim ds 1 tab every 12 hours for 7 days for now. Will wait on urine culture report to decide further on change of antibiotics (if needed).        ------

## 2014-01-25 LAB — CLOSTRIDIUM DIFFICILE BY PCR: Toxigenic C. Difficile by PCR: NEGATIVE

## 2014-01-28 LAB — URINE CULTURE

## 2014-01-30 ENCOUNTER — Telehealth: Payer: Self-pay | Admitting: *Deleted

## 2014-01-30 NOTE — Telephone Encounter (Signed)
Patient son, [name], called and stated that patient is no better and would like to know the final results of Urine Culture. Only has 2 antibiotic pills left. Please Advise.

## 2014-01-31 NOTE — Telephone Encounter (Signed)
addressed

## 2014-01-31 NOTE — Telephone Encounter (Signed)
Patient has an appointment tomorrow with Deberah Pelton

## 2014-02-01 ENCOUNTER — Ambulatory Visit (INDEPENDENT_AMBULATORY_CARE_PROVIDER_SITE_OTHER): Payer: Medicare HMO | Admitting: Nurse Practitioner

## 2014-02-01 ENCOUNTER — Encounter: Payer: Self-pay | Admitting: Nurse Practitioner

## 2014-02-01 VITALS — BP 160/82 | HR 60 | Temp 96.8°F | Resp 16

## 2014-02-01 DIAGNOSIS — L0291 Cutaneous abscess, unspecified: Secondary | ICD-10-CM

## 2014-02-01 DIAGNOSIS — L8991 Pressure ulcer of unspecified site, stage 1: Secondary | ICD-10-CM

## 2014-02-01 DIAGNOSIS — B354 Tinea corporis: Secondary | ICD-10-CM

## 2014-02-01 DIAGNOSIS — F0280 Dementia in other diseases classified elsewhere without behavioral disturbance: Secondary | ICD-10-CM

## 2014-02-01 DIAGNOSIS — G309 Alzheimer's disease, unspecified: Secondary | ICD-10-CM

## 2014-02-01 DIAGNOSIS — L8990 Pressure ulcer of unspecified site, unspecified stage: Secondary | ICD-10-CM

## 2014-02-01 DIAGNOSIS — L0390 Cellulitis, unspecified: Secondary | ICD-10-CM

## 2014-02-01 MED ORDER — SULFAMETHOXAZOLE-TMP DS 800-160 MG PO TABS: 1.0000 | ORAL_TABLET | Freq: Two times a day (BID) | ORAL | Status: DC

## 2014-02-01 MED ORDER — CLOTRIMAZOLE-BETAMETHASONE 1-0.05 % EX CREA: 1.0000 "application " | TOPICAL_CREAM | Freq: Two times a day (BID) | CUTANEOUS | Status: DC

## 2014-02-01 NOTE — Progress Notes (Signed)
Patient ID: Ricky [name], male   DOB: [birth year], 80 y.o.   MRN: [id number]    Allergies  Allergen Reactions  . Other Other (See Comments)    Dairy products- cause swallowing difficulty     Chief Complaint  Patient presents with  . Acute Visit    rt leg- redness, swelling, blister, ) decubitus ulcer on bottom     HPI: Patient is a 80 y.o. male seen in the office today for follow up on UTI and red swelling and redness, was treated for UTI but son reports no change, still lethargic and withdrawled, not feeding himself, per son he reports his feet are "heavy" and this is indicative of some kind of infection, reports his dad is just not right still  Blister on right calf that busted yesterday - swelling and redness started 10 days ago (right when he started bactrim) and since then has gotten better but not resolved Son reports he feels as if he is constantly aspirating Still eating and drinking Does not have shortness of breath or chest pains, no fevers Blood pressures are variable, sometimes sbp 110 others 180s Has home health nurse coming out monthly and as needed Has ST and PT who will resume treatment once mentation is better Review of Systems:  Review of Systems  Unable to perform ROS: dementia     Past Medical History  Diagnosis Date  . IBS (irritable bowel syndrome)   . Diverticulosis   . Cataract   . OAB (overactive bladder)   . Macular degeneration   . Alzheimer disease   . Hypertension   . Arthritis   . Osteoporosis   . Parkinsonian syndrome   . Syncope, vasovagal   . Neuromuscular disorder     parkisonian syndrome  . 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 Unspecified transient cerebral ischemia   . Unspecified urinary incontinence   . Senile dementia, uncomplicated   . Unspecified constipation   . Hypothyroidism   . Anemia, unspecified   . Depression   . Alzheimer's disease   . Gout, unspecified   . Thoracic or  lumbosacral neuritis or radiculitis, unspecified   . Progressive supranuclear palsy   . Unspecified transient cerebral ischemia   . Unspecified urinary incontinence   . Syncope and collapse   . Paralysis agitans   . Unspecified transient cerebral ischemia   . Pneumonitis due to inhalation of food or vomitus   . Cellulitis and abscess of oral soft tissues   . Screening for lipoid disorders   . Senile dementia, uncomplicated   . Unspecified constipation   . Disorder of bone and cartilage, unspecified   . Other specified acquired hypothyroidism   . Unspecified hypothyroidism   . Anemia, unspecified   . Depressive disorder, not elsewhere classified   . Alzheimer's disease   . Paralysis agitans   . Pneumonia, organism unspecified   . Pseudomonas infection in conditions classified elsewhere and of unspecified site   . Unspecified essential hypertension   . Urinary tract infection, site not specified   . Osteoarthrosis, unspecified whether generalized or localized, unspecified site   . Osteoporosis, unspecified   . Gout, unspecified   . Thoracic or lumbosacral neuritis or radiculitis, unspecified   . Stroke    Past Surgical History  Procedure Laterality Date  . Esophagogastroduodenoscopy    . Eye surgery  2006    LEFT CATARACT   Social History:   reports that he has quit smoking. He has  never used smokeless tobacco. He reports that he does not drink alcohol or use illicit drugs.  Family History  Problem Relation Age of Onset  . Cancer Mother   . Stroke Father   . Liver disease Father   . Kidney disease Brother   . Liver disease Brother   . Emphysema Brother     Medications: Patient's Medications  New Prescriptions   No medications on file  Previous Medications   ACETAMINOPHEN (TYLENOL) 500 MG TABLET    Take 500 mg by mouth 3 (three) times daily.    ASPIRIN EC 325 MG EC TABLET    Take 1 tablet (325 mg total) by mouth daily.   ATORVASTATIN (LIPITOR) 10 MG TABLET    Take  1 tablet (10 mg total) by mouth daily at 6 PM.   BACITRACIN OINTMENT    Apply 1 application topically 2 (two) times daily. Applied to tip of penis for skin tear.   BIFIDOBACTERIUM INFANTIS (ALIGN) CAPSULE    TAKE ONE CAPSULE BY MOUTH TWICE A DAY FOR 14 DAYS   CALCIUM CARBONATE-VIT D-MIN (CALTRATE 600+D PLUS) 600-800 MG-UNIT CHEW    Chew 1 tablet by mouth 2 (two) times daily with a meal.    CRANBERRY PO    Take 4,200 mg by mouth 3 (three) times daily.   CYANOCOBALAMIN (VITAMIN B-12) 2500 MCG SUBL    Place 5,000 mcg under the tongue daily.    D-MANNOSE POWD    Take 2,000 mg by mouth 2 (two) times daily.   FERROUS SULFATE 325 (65 FE) MG TABLET    Take 325 mg by mouth every other day.    GUAIFENESIN (MUCINEX) 600 MG 12 HR TABLET    Take 1,200 mg by mouth 2 (two) times daily as needed for congestion.    HYDRALAZINE (APRESOLINE) 25 MG TABLET    Take 0.5 tablets (12.5 mg total) by mouth 2 (two) times daily.   LACTOSE FREE NUTRITION (BOOST) LIQD    Take 90 mLs by mouth 2 (two) times daily between meals.    LEVOTHYROXINE (SYNTHROID, LEVOTHROID) 25 MCG TABLET    Take 25 mcg by mouth daily before breakfast.   LIDOCAINE (LIDODERM) 5 %    Place 1 patch onto the skin daily as needed (for back pain). Remove & Discard patch within 12 hours or as directed by MD   MELATONIN EXTRA STRENGTH PO    Take 15 mLs by mouth at bedtime as needed (sleep).    MEMANTINE (NAMENDA) 10 MG TABLET    Take 10 mg by mouth 2 (two) times daily.   MULTIPLE VITAMIN (MULTIVITAMIN WITH MINERALS) TABS    Take 1 tablet by mouth daily.   NAMENDA 10 MG TABLET    TAKE 1 TABLET BY MOUTH TWICE A DAY TO PRESERVE MEMORY   NEUPRO 2 MG/24HR       POLYETHYLENE GLYCOL (MIRALAX / GLYCOLAX) PACKET    Take 17 g by mouth daily.   SACCHAROMYCES BOULARDII (FLORASTOR) 250 MG CAPSULE    Take 1 capsule (250 mg total) by mouth 2 (two) times daily.   SULFAMETHOXAZOLE-TRIMETHOPRIM (BACTRIM DS) 800-160 MG PER TABLET    Take 1 tablet by mouth 2 (two) times daily.    Modified Medications   No medications on file  Discontinued Medications   UNABLE TO FIND    Outpatient physical therapy Progressive Supranuclear gaze palsy, Parkinson's Syncope     Physical Exam:  Filed Vitals:   02/01/14 0903  BP: 160/82  Pulse: 60  Temp: 96.8 F (36 C)  TempSrc: Oral  Resp: 16  SpO2: 96%   Physical Exam  Constitutional: No distress.  HENT:  Mouth/Throat: Oropharynx is clear and moist. No oropharyngeal exudate.  Neck: Normal range of motion. Neck supple. No thyromegaly present.  Cardiovascular: Normal rate, regular rhythm and normal heart sounds.   Pulmonary/Chest: Effort normal and breath sounds normal. No respiratory distress.  Abdominal: Soft. Bowel sounds are normal. He exhibits no distension.  Musculoskeletal: He exhibits edema and tenderness.  +1 edema to right lower extremity; pt wearing TED, redness noted, no heat, pt has 3x1 sized open area where a blister was, is not infected, foot and leg tender on exam  Neurological: He is alert.  Skin: Skin is warm and dry. He is not diaphoretic.  Small round area appearing fungal on left buttock  Stage 1 pressure ulcer on sacrum over bony prominence   Psychiatric:  Slow to respond     Labs reviewed: Basic Metabolic Panel:  Recent Labs  02/11/13 1722  08/29/13 0955  01/15/14 1424 01/15/14 1850 01/16/14 0055 01/24/14 1203  NA 134*  < > 139  < > 134*  --  134* 136  K 4.0  < > 4.2  < > 4.9  --  3.9 4.4  CL 99  < > 97  < > 97  --  99 99  CO2 26  < > 19  < > 24  --  23 27  GLUCOSE 100*  < > 82  < > 94  --  90 89  BUN 9  < > 6*  < > 8  --  7 9*  CREATININE 0.81  < > 0.77  < > 0.93  --  0.84 0.81  CALCIUM 9.1  < > 9.3  < > 8.9  --  8.9 9.2  MG 2.2  --   --   --   --   --   --   --   PHOS 2.8  --   --   --   --   --   --   --   TSH  --   --  1.180  --   --  0.840  --   --   < > = values in this interval not displayed. Liver Function Tests:  Recent Labs  09/09/13 0735 10/18/13 1036  01/15/14 1424 01/24/14 1203  AST 20 24 28 19   ALT 11 15 14 12   ALKPHOS 113 108 74 79  BILITOT 0.5 0.5 0.3 0.4  PROT 7.9 7.8 7.3 7.6  ALBUMIN 3.5 3.7 3.3*  --    No results found for this basename: LIPASE, AMYLASE,  in the last 8760 hours No results found for this basename: AMMONIA,  in the last 8760 hours CBC:  Recent Labs  11/03/13 0915 01/15/14 1532 01/16/14 0055 01/17/14 0450 01/24/14 1203  WBC 5.6 6.7 4.7 4.3 7.7  NEUTROABS 3.6 5.0  --   --  5.3  HGB 10.9* 11.6* 10.8* 10.7* 11.7*  HCT 34.1* 35.0* 32.6* 32.0* 33.6*  MCV 88 87.1 85.8 86.3 82  PLT 305 149* 158 147*  --    Lipid Panel: No results found for this basename: CHOL, HDL, LDLCALC, TRIG, CHOLHDL, LDLDIRECT,  in the last 8760 hours TSH:  Recent Labs  08/29/13 0955 01/15/14 1850  TSH 1.180 0.840   A1C: No components found with this basename: A1C,    Assessment/Plan 1. Alzheimer disease -progressive, most likely due to worsening disease  state pts mentation has declined after hospitalization, however could also be related to UTI and cellulitis  2. Cellulitis -improved with treatment of bactrim for UTI but not resolved; will extend treatment of bactrim to take for 5 additional days with florastor - Comprehensive metabolic panel - CBC With differential/Platelet - sulfamethoxazole-trimethoprim (BACTRIM DS) 800-160 MG per tablet; Take 1 tablet by mouth 2 (two) times daily.  Dispense: 14 tablet; Refill: 0  3. Tinea corporis -on buttock; - clotrimazole-betamethasone (LOTRISONE) cream; Apply 1 application topically 2 (two) times daily until resolved  4. Stage 1 pressure ulcer -pressure reduction, good nutrition, proper hydration -may use duoderm (rx given for #7) to apply weekly until resolved  Keep follow-up with Mariea Clonts, follow up sooner if needed for worsening cellulitis, swelling, tenderness in leg or worsening pressure ulcer

## 2014-02-03 DIAGNOSIS — G20A1 Parkinson's disease without dyskinesia, without mention of fluctuations: Secondary | ICD-10-CM

## 2014-02-03 DIAGNOSIS — G20 Parkinson's disease: Secondary | ICD-10-CM

## 2014-02-03 DIAGNOSIS — R339 Retention of urine, unspecified: Secondary | ICD-10-CM

## 2014-02-03 DIAGNOSIS — L89899 Pressure ulcer of other site, unspecified stage: Secondary | ICD-10-CM

## 2014-02-03 DIAGNOSIS — T83511A Infection and inflammatory reaction due to indwelling urethral catheter, initial encounter: Secondary | ICD-10-CM

## 2014-03-02 ENCOUNTER — Encounter: Payer: Self-pay | Admitting: Internal Medicine

## 2014-03-02 ENCOUNTER — Ambulatory Visit (INDEPENDENT_AMBULATORY_CARE_PROVIDER_SITE_OTHER): Payer: Medicare HMO | Admitting: Internal Medicine

## 2014-03-02 VITALS — BP 142/78 | HR 58 | Temp 97.8°F | Resp 10

## 2014-03-02 DIAGNOSIS — G238 Other specified degenerative diseases of basal ganglia: Secondary | ICD-10-CM

## 2014-03-02 DIAGNOSIS — S50329A Blister (nonthermal) of unspecified elbow, initial encounter: Secondary | ICD-10-CM

## 2014-03-02 DIAGNOSIS — S60829A Blister (nonthermal) of unspecified wrist, initial encounter: Secondary | ICD-10-CM

## 2014-03-02 DIAGNOSIS — E46 Unspecified protein-calorie malnutrition: Secondary | ICD-10-CM

## 2014-03-02 DIAGNOSIS — G231 Progressive supranuclear ophthalmoplegia [Steele-Richardson-Olszewski]: Secondary | ICD-10-CM

## 2014-03-02 DIAGNOSIS — R339 Retention of urine, unspecified: Secondary | ICD-10-CM

## 2014-03-02 DIAGNOSIS — S50829A Blister (nonthermal) of unspecified forearm, initial encounter: Secondary | ICD-10-CM

## 2014-03-02 DIAGNOSIS — I872 Venous insufficiency (chronic) (peripheral): Secondary | ICD-10-CM

## 2014-03-02 DIAGNOSIS — L8990 Pressure ulcer of unspecified site, unspecified stage: Secondary | ICD-10-CM

## 2014-03-02 DIAGNOSIS — L8992 Pressure ulcer of unspecified site, stage 2: Secondary | ICD-10-CM

## 2014-03-02 DIAGNOSIS — S50321A Blister (nonthermal) of right elbow, initial encounter: Secondary | ICD-10-CM

## 2014-03-02 MED ORDER — COLLAGENASE 250 UNIT/GM EX OINT: 1.0000 "application " | TOPICAL_OINTMENT | Freq: Every day | CUTANEOUS | Status: DC

## 2014-03-02 NOTE — Progress Notes (Signed)
Patient ID: Ricky [name], male   DOB: 28-Jul-1917, 80 y.o.   MRN: [id number]   Location:  [facility]  Code Status: full code;  Again discussed his poor prognosis with his family today, but they did not plan to change things--they say he would not want to be kept on machines and say they will discuss if he has to be put on them   Allergies  Allergen Reactions  . Other Other (See Comments)    Dairy products- cause swallowing difficulty     Chief Complaint  Patient presents with  . Medical Managment of Chronic Issues    3 month follow-up  . Referral    Orthro Referral   . Blister    Right elbow blister x 24 hours   . Tailbone Pain    Raised area on tail bond, changed in size     HPI: Patient is a 80 y.o. black male seen in the office today for medical mgt of chronic diseases.  They are requesting an ortho referral for his back pain.    He has a right elbow blister discovered this morning--is full of fluid.    His tailbone still has a raised area on it.  Tailbone prominent--had lost teeth and not eating well.  Probably has lost 15 lbs (cannot get up to be weighed).  Had dime-sized bruise that started off looking like a heat rash--didn't look like trauma, possibly fungus.  Progressed into dime-sized pink area.  Tried duoderm--got worse.  Used 1/4 of duoderm--rolled and peeled off--didn't cover surroundings--was near anus so tried to avoid feces.  Then got ultrathin duoderm--still rolled.  Then got raw.  Now quarter-sized.  Home health nurse now using hydrogel after antiseptic cleanser.  Has seen him 4x.  She feels there is granulation on the edges, but has gotten larger.  He's been less mobile and less ambulatory since his last hospital stay.  Not worse looking.  ? Santyl needed.    Got treated for cellulitis--improved, but didn't go away altogether.  Bactrim for UTI helped it.  Still a scab near where blister was on inside of his leg.  Remains  hard and crusty.  On Easter Sunday, went down steps with wheelchair, son was alone and both pt and son ended up on backs.  Peeled up skin on back of his right hand.    Review of Systems:  Review of Systems  Constitutional: Positive for malaise/fatigue. Negative for fever.       Obtained from family  Respiratory: Negative for shortness of breath.   Cardiovascular: Negative for chest pain and leg swelling.  Gastrointestinal: Negative for constipation.  Genitourinary:       Has indwelling catheter with right leg bag  Musculoskeletal: Positive for back pain and falls.  Skin:       See hpi  Neurological: Positive for weakness. Negative for loss of consciousness.  Endo/Heme/Allergies: Bruises/bleeds easily.  Psychiatric/Behavioral: Positive for memory loss.     Past Medical History  Diagnosis Date  . IBS (irritable bowel syndrome)   . Diverticulosis   . Cataract   . OAB (overactive bladder)   . Macular degeneration   . Alzheimer disease   . Hypertension   . Arthritis   . Osteoporosis   . Parkinsonian syndrome   . Syncope, vasovagal   . Neuromuscular disorder     parkisonian syndrome  . 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 Unspecified transient  cerebral ischemia   . Unspecified urinary incontinence   . Senile dementia, uncomplicated   . Unspecified constipation   . Hypothyroidism   . Anemia, unspecified   . Depression   . Alzheimer's disease   . Gout, unspecified   . Thoracic or lumbosacral neuritis or radiculitis, unspecified   . Progressive supranuclear palsy   . Unspecified transient cerebral ischemia   . Unspecified urinary incontinence   . Syncope and collapse   . Paralysis agitans   . Unspecified transient cerebral ischemia   . Pneumonitis due to inhalation of food or vomitus   . Cellulitis and abscess of oral soft tissues   . Screening for lipoid disorders   . Senile dementia, uncomplicated   . Unspecified constipation   .  Disorder of bone and cartilage, unspecified   . Other specified acquired hypothyroidism   . Unspecified hypothyroidism   . Anemia, unspecified   . Depressive disorder, not elsewhere classified   . Alzheimer's disease   . Paralysis agitans   . Pneumonia, organism unspecified   . Pseudomonas infection in conditions classified elsewhere and of unspecified site   . Unspecified essential hypertension   . Urinary tract infection, site not specified   . Osteoarthrosis, unspecified whether generalized or localized, unspecified site   . Osteoporosis, unspecified   . Gout, unspecified   . Thoracic or lumbosacral neuritis or radiculitis, unspecified   . Stroke     Past Surgical History  Procedure Laterality Date  . Esophagogastroduodenoscopy    . Eye surgery  2006    LEFT CATARACT    Social History:   reports that he has quit smoking. He has never used smokeless tobacco. He reports that he does not drink alcohol or use illicit drugs.  Family History  Problem Relation Age of Onset  . Cancer Mother   . Stroke Father   . Liver disease Father   . Kidney disease Brother   . Liver disease Brother   . Emphysema Brother     Medications: Patient's Medications  New Prescriptions   No medications on file  Previous Medications   ACETAMINOPHEN (TYLENOL) 500 MG TABLET    Take 1,000 mg by mouth 3 (three) times daily.    ASPIRIN EC 325 MG EC TABLET    Take 1 tablet (325 mg total) by mouth daily.   ATORVASTATIN (LIPITOR) 10 MG TABLET    Take 1 tablet (10 mg total) by mouth daily at 6 PM.   BACITRACIN OINTMENT    Apply 1 application topically 2 (two) times daily. Applied to tip of penis for skin tear.   BIFIDOBACTERIUM INFANTIS (ALIGN) CAPSULE    TAKE ONE CAPSULE BY MOUTH TWICE A DAY FOR 14 DAYS   CALCIUM CARBONATE-VIT D-MIN (CALTRATE 600+D PLUS) 600-800 MG-UNIT CHEW    Chew 1 tablet by mouth 2 (two) times daily with a meal.    CRANBERRY PO    Take 4,200 mg by mouth 3 (three) times daily.    CYANOCOBALAMIN (VITAMIN B-12) 2500 MCG SUBL    Place 5,000 mcg under the tongue daily.    D-MANNOSE POWD    Take 2,000 mg by mouth 2 (two) times daily.   FERROUS SULFATE 325 (65 FE) MG TABLET    Take 325 mg by mouth every other day.    GUAIFENESIN (MUCINEX) 600 MG 12 HR TABLET    Take 1,200 mg by mouth 2 (two) times daily as needed for congestion.    HYDRALAZINE (APRESOLINE) 25 MG TABLET  Take 0.5 tablets (12.5 mg total) by mouth 2 (two) times daily.   LACTOSE FREE NUTRITION (BOOST) LIQD    Take 90 mLs by mouth 2 (two) times daily between meals.    LEVOTHYROXINE (SYNTHROID, LEVOTHROID) 25 MCG TABLET    Take 25 mcg by mouth daily before breakfast.   LIDOCAINE (LIDODERM) 5 %    Place 2 patches onto the skin daily as needed (for back pain). Remove & Discard patch within 12 hours or as directed by MD   MELATONIN EXTRA STRENGTH PO    Take 15 mLs by mouth at bedtime as needed (sleep).    MULTIPLE VITAMIN (MULTIVITAMIN WITH MINERALS) TABS    Take 1 tablet by mouth daily.   NAMENDA 10 MG TABLET    TAKE 1 TABLET BY MOUTH TWICE A DAY TO PRESERVE MEMORY   NEUPRO 2 MG/24HR       POLYETHYLENE GLYCOL (MIRALAX / GLYCOLAX) PACKET    Take 17 g by mouth daily.  Modified Medications   Modified Medication Previous Medication   CLOTRIMAZOLE-BETAMETHASONE (LOTRISONE) CREAM clotrimazole-betamethasone (LOTRISONE) cream      Apply 1 application topically 2 (two) times daily. As needed    Apply 1 application topically 2 (two) times daily.  Discontinued Medications   MEMANTINE (NAMENDA) 10 MG TABLET    Take 10 mg by mouth 2 (two) times daily.   SACCHAROMYCES BOULARDII (FLORASTOR) 250 MG CAPSULE    Take 1 capsule (250 mg total) by mouth 2 (two) times daily.   SULFAMETHOXAZOLE-TRIMETHOPRIM (BACTRIM DS) 800-160 MG PER TABLET    Take 1 tablet by mouth 2 (two) times daily.     Physical Exam: Filed Vitals:   03/02/14 0857  BP: 142/78  Pulse: 58  Temp: 97.8 F (36.6 C)  TempSrc: Oral  Resp: 10  SpO2: 96%    Physical Exam  Eyes:  ophthalmoplegia  Cardiovascular: Normal rate, regular rhythm, normal heart sounds and intact distal pulses.   Chronic venous stasis changes, scab on leg  Pulmonary/Chest: Effort normal and breath sounds normal. He has no rales.  Abdominal: Soft. Bowel sounds are normal. He exhibits no distension and no mass. There is no tenderness.  Musculoskeletal: He exhibits tenderness.  Contracted black male, kyphotic posture--requires help to stand and be upright for buttocks exam;  Tenderness of low back, no tenderness around sacral wound  Neurological:  Somnolent--has to be awakened to participate in visit  Skin:  Right elbow with flattened blister present, nontender;  Right hand with skin tear present, left also with healing skin tear (both on dorsal surface); stage II egg-shaped sacral decubitus ulcer about the size of a half dollar with granulation tissue that is yellow and brown in the middle, a small pink ring around it and dark discoloration of skin around the edges  Psychiatric:  Flat affect     Labs reviewed: Basic Metabolic Panel:  Recent Labs  08/29/13 0955  01/15/14 1424 01/15/14 1850 01/16/14 0055 01/24/14 1203  NA 139  < > 134*  --  134* 136  K 4.2  < > 4.9  --  3.9 4.4  CL 97  < > 97  --  99 99  CO2 19  < > 24  --  23 27  GLUCOSE 82  < > 94  --  90 89  BUN 6*  < > 8  --  7 9*  CREATININE 0.77  < > 0.93  --  0.84 0.81  CALCIUM 9.3  < > 8.9  --  8.9 9.2  TSH 1.180  --   --  0.840  --   --   < > = values in this interval not displayed. Liver Function Tests:  Recent Labs  09/09/13 0735 10/18/13 1036 01/15/14 1424 01/24/14 1203  AST 20 24 28 19   ALT 11 15 14 12   ALKPHOS 113 108 74 79  BILITOT 0.5 0.5 0.3 0.4  PROT 7.9 7.8 7.3 7.6  ALBUMIN 3.5 3.7 3.3*  --   CBC:  Recent Labs  11/03/13 0915 01/15/14 1532 01/16/14 0055 01/17/14 0450 01/24/14 1203  WBC 5.6 6.7 4.7 4.3 7.7  NEUTROABS 3.6 5.0  --   --  5.3  HGB 10.9* 11.6* 10.8* 10.7*  11.7*  HCT 34.1* 35.0* 32.6* 32.0* 33.6*  MCV 88 87.1 85.8 86.3 82  PLT 305 149* 158 147*  --    Lab Results  Component Value Date   HGBA1C 5.3 06/21/2012   Assessment/Plan 1. Progressive supranuclear ophthalmoplegia or palsy -progressing with more periods of somnolence and worsening of his posture;  Also has skin breakdown of sacrum due to his protein calorie malnutrition with decreased po intake as his dementia progresses -his family still desires full code status at this time   2. Pressure ulcer stage II - being followed by home health nurse - due to granulation tissue in the center, will try santyl for it -seems to be healing improperly with edges turning under--if not improvement by next visit with this, would recommend returning to [facility] for scalpel debridement - collagenase (SANTYL) ointment; Apply 1 application topically daily.  Dispense: 30 g; Refill: 3  3. Urinary retention with incomplete bladder emptying -continues with catheter -latest "UTI" treated with bactrim--i have educated the family several times about the concept of colonization and the importance of hydration as the primary preventative approach for UTIs and to avoid excessive testing -I do not believe his behavior today warrants urine testing  4. Venous insufficiency (chronic) (peripheral) -has this--may or may not have had cellulitis on his leg, but did not appear to when I looked at the blister on his leg  5. Blister of right elbow -keep wrapped until resolves, but has flattened  6. Unspecified protein-calorie malnutrition -due to his dementia as part of his PSP  Labs/tests ordered:   Next appt:  1 month  Want to get thru [facility]--was just seen Monday--they want all of these items for him:  Percussion vest, wheelchair, transit chair, neck support for muscles of neck, hospital bed, ramp, electronic bp monitor, baby monitor

## 2014-03-08 ENCOUNTER — Inpatient Hospital Stay (HOSPITAL_COMMUNITY)
Admission: EM | Admit: 2014-03-08 | Discharge: 2014-03-13 | DRG: 690 | Disposition: A | Discharge status: Home Health | Payer: Medicare HMO | Attending: Internal Medicine | Admitting: Internal Medicine

## 2014-03-08 ENCOUNTER — Emergency Department (HOSPITAL_COMMUNITY): Payer: Medicare HMO

## 2014-03-08 ENCOUNTER — Encounter (HOSPITAL_COMMUNITY): Payer: Self-pay | Admitting: Emergency Medicine

## 2014-03-08 DIAGNOSIS — M109 Gout, unspecified: Secondary | ICD-10-CM | POA: Diagnosis present

## 2014-03-08 DIAGNOSIS — N3090 Cystitis, unspecified without hematuria: Principal | ICD-10-CM | POA: Diagnosis present

## 2014-03-08 DIAGNOSIS — K589 Irritable bowel syndrome without diarrhea: Secondary | ICD-10-CM | POA: Diagnosis present

## 2014-03-08 DIAGNOSIS — I10 Essential (primary) hypertension: Secondary | ICD-10-CM | POA: Diagnosis present

## 2014-03-08 DIAGNOSIS — Z8673 Personal history of transient ischemic attack (TIA), and cerebral infarction without residual deficits: Secondary | ICD-10-CM

## 2014-03-08 DIAGNOSIS — R627 Adult failure to thrive: Secondary | ICD-10-CM | POA: Diagnosis present

## 2014-03-08 DIAGNOSIS — I129 Hypertensive chronic kidney disease with stage 1 through stage 4 chronic kidney disease, or unspecified chronic kidney disease: Secondary | ICD-10-CM | POA: Diagnosis present

## 2014-03-08 DIAGNOSIS — G20 Parkinson's disease: Secondary | ICD-10-CM | POA: Diagnosis present

## 2014-03-08 DIAGNOSIS — G309 Alzheimer's disease, unspecified: Secondary | ICD-10-CM | POA: Diagnosis present

## 2014-03-08 DIAGNOSIS — W19XXXA Unspecified fall, initial encounter: Secondary | ICD-10-CM | POA: Diagnosis present

## 2014-03-08 DIAGNOSIS — F0390 Unspecified dementia without behavioral disturbance: Secondary | ICD-10-CM | POA: Diagnosis present

## 2014-03-08 DIAGNOSIS — M810 Age-related osteoporosis without current pathological fracture: Secondary | ICD-10-CM | POA: Diagnosis present

## 2014-03-08 DIAGNOSIS — F0280 Dementia in other diseases classified elsewhere without behavioral disturbance: Secondary | ICD-10-CM | POA: Diagnosis present

## 2014-03-08 DIAGNOSIS — R531 Weakness: Secondary | ICD-10-CM

## 2014-03-08 DIAGNOSIS — N400 Enlarged prostate without lower urinary tract symptoms: Secondary | ICD-10-CM | POA: Diagnosis present

## 2014-03-08 DIAGNOSIS — G238 Other specified degenerative diseases of basal ganglia: Secondary | ICD-10-CM | POA: Diagnosis present

## 2014-03-08 DIAGNOSIS — D6489 Other specified anemias: Secondary | ICD-10-CM | POA: Diagnosis present

## 2014-03-08 DIAGNOSIS — Z87891 Personal history of nicotine dependence: Secondary | ICD-10-CM

## 2014-03-08 DIAGNOSIS — R1310 Dysphagia, unspecified: Secondary | ICD-10-CM | POA: Diagnosis present

## 2014-03-08 DIAGNOSIS — F329 Major depressive disorder, single episode, unspecified: Secondary | ICD-10-CM | POA: Diagnosis present

## 2014-03-08 DIAGNOSIS — G20A1 Parkinson's disease without dyskinesia, without mention of fluctuations: Secondary | ICD-10-CM | POA: Diagnosis present

## 2014-03-08 DIAGNOSIS — N182 Chronic kidney disease, stage 2 (mild): Secondary | ICD-10-CM | POA: Diagnosis present

## 2014-03-08 DIAGNOSIS — N390 Urinary tract infection, site not specified: Secondary | ICD-10-CM | POA: Diagnosis present

## 2014-03-08 DIAGNOSIS — G9340 Encephalopathy, unspecified: Secondary | ICD-10-CM

## 2014-03-08 DIAGNOSIS — F3289 Other specified depressive episodes: Secondary | ICD-10-CM | POA: Diagnosis present

## 2014-03-08 DIAGNOSIS — E785 Hyperlipidemia, unspecified: Secondary | ICD-10-CM | POA: Diagnosis present

## 2014-03-08 DIAGNOSIS — D649 Anemia, unspecified: Secondary | ICD-10-CM | POA: Diagnosis present

## 2014-03-08 DIAGNOSIS — S32009A Unspecified fracture of unspecified lumbar vertebra, initial encounter for closed fracture: Secondary | ICD-10-CM | POA: Diagnosis present

## 2014-03-08 DIAGNOSIS — E039 Hypothyroidism, unspecified: Secondary | ICD-10-CM | POA: Diagnosis present

## 2014-03-08 DIAGNOSIS — R918 Other nonspecific abnormal finding of lung field: Secondary | ICD-10-CM | POA: Diagnosis present

## 2014-03-08 LAB — URINALYSIS, ROUTINE W REFLEX MICROSCOPIC
Bilirubin Urine: NEGATIVE
Glucose, UA: NEGATIVE mg/dL
Ketones, ur: NEGATIVE mg/dL
NITRITE: POSITIVE — AB
PROTEIN: NEGATIVE mg/dL
SPECIFIC GRAVITY, URINE: 1.016 (ref 1.005–1.030)
UROBILINOGEN UA: 0.2 mg/dL (ref 0.0–1.0)
pH: 8 (ref 5.0–8.0)

## 2014-03-08 LAB — CBC WITH DIFFERENTIAL/PLATELET
BASOS ABS: 0 10*3/uL (ref 0.0–0.1)
Basophils Relative: 0 % (ref 0–1)
EOS ABS: 0 10*3/uL (ref 0.0–0.7)
EOS PCT: 1 % (ref 0–5)
HCT: 34.9 % — ABNORMAL LOW (ref 39.0–52.0)
Hemoglobin: 11.1 g/dL — ABNORMAL LOW (ref 13.0–17.0)
LYMPHS ABS: 1.3 10*3/uL (ref 0.7–4.0)
Lymphocytes Relative: 20 % (ref 12–46)
MCH: 28.5 pg (ref 26.0–34.0)
MCHC: 31.8 g/dL (ref 30.0–36.0)
MCV: 89.5 fL (ref 78.0–100.0)
Monocytes Absolute: 0.5 10*3/uL (ref 0.1–1.0)
Monocytes Relative: 8 % (ref 3–12)
NEUTROS PCT: 71 % (ref 43–77)
Neutro Abs: 4.7 10*3/uL (ref 1.7–7.7)
PLATELETS: 258 10*3/uL (ref 150–400)
RBC: 3.9 MIL/uL — AB (ref 4.22–5.81)
RDW: 14.6 % (ref 11.5–15.5)
WBC: 6.5 10*3/uL (ref 4.0–10.5)

## 2014-03-08 LAB — TSH: TSH: 1.49 u[IU]/mL (ref 0.350–4.500)

## 2014-03-08 LAB — URINE MICROSCOPIC-ADD ON

## 2014-03-08 NOTE — ED Notes (Signed)
Returned from CT.

## 2014-03-08 NOTE — ED Notes (Signed)
Pt was standing with son cleansing buttocks when pt fell right sideways striking head on door knob (son believes). Denies LOC.  Small approx 2-3 mm wound to left eyebrow, bleeding controlled.  No other complaints.

## 2014-03-09 DIAGNOSIS — W19XXXA Unspecified fall, initial encounter: Secondary | ICD-10-CM | POA: Diagnosis present

## 2014-03-09 DIAGNOSIS — G9340 Encephalopathy, unspecified: Secondary | ICD-10-CM

## 2014-03-09 DIAGNOSIS — N390 Urinary tract infection, site not specified: Secondary | ICD-10-CM

## 2014-03-09 DIAGNOSIS — R531 Weakness: Secondary | ICD-10-CM | POA: Diagnosis present

## 2014-03-09 LAB — BASIC METABOLIC PANEL
BUN: 9 mg/dL (ref 6–23)
CHLORIDE: 100 meq/L (ref 96–112)
CO2: 25 mEq/L (ref 19–32)
Calcium: 9.2 mg/dL (ref 8.4–10.5)
Creatinine, Ser: 0.77 mg/dL (ref 0.50–1.35)
GFR calc Af Amer: 87 mL/min — ABNORMAL LOW (ref >90)
GFR calc non Af Amer: 75 mL/min — ABNORMAL LOW (ref >90)
Glucose, Bld: 93 mg/dL (ref 70–99)
POTASSIUM: 4.4 meq/L (ref 3.7–5.3)
Sodium: 138 mEq/L (ref 137–147)

## 2014-03-09 LAB — CBC
HEMATOCRIT: 33.3 % — AB (ref 39.0–52.0)
Hemoglobin: 10.7 g/dL — ABNORMAL LOW (ref 13.0–17.0)
MCH: 28.5 pg (ref 26.0–34.0)
MCHC: 32.1 g/dL (ref 30.0–36.0)
MCV: 88.6 fL (ref 78.0–100.0)
Platelets: 266 10*3/uL (ref 150–400)
RBC: 3.76 MIL/uL — ABNORMAL LOW (ref 4.22–5.81)
RDW: 14.4 % (ref 11.5–15.5)
WBC: 6.7 10*3/uL (ref 4.0–10.5)

## 2014-03-09 LAB — COMPREHENSIVE METABOLIC PANEL
ALT: 13 U/L (ref 0–53)
AST: 20 U/L (ref 0–37)
Albumin: 3.6 g/dL (ref 3.5–5.2)
Alkaline Phosphatase: 78 U/L (ref 39–117)
BUN: 11 mg/dL (ref 6–23)
CO2: 25 mEq/L (ref 19–32)
Calcium: 9.2 mg/dL (ref 8.4–10.5)
Chloride: 100 mEq/L (ref 96–112)
Creatinine, Ser: 0.82 mg/dL (ref 0.50–1.35)
GFR calc non Af Amer: 73 mL/min — ABNORMAL LOW (ref >90)
GFR, EST AFRICAN AMERICAN: 85 mL/min — AB (ref >90)
Glucose, Bld: 91 mg/dL (ref 70–99)
POTASSIUM: 4.4 meq/L (ref 3.7–5.3)
SODIUM: 138 meq/L (ref 137–147)
TOTAL PROTEIN: 7.9 g/dL (ref 6.0–8.3)
Total Bilirubin: 0.3 mg/dL (ref 0.3–1.2)

## 2014-03-09 LAB — MRSA PCR SCREENING: MRSA by PCR: NEGATIVE

## 2014-03-09 MED ORDER — DEXTROSE 5 % IV SOLN: 1.0000 g | INTRAVENOUS | Status: DC

## 2014-03-09 MED ORDER — CALTRATE 600+D PLUS MINERALS 600-800 MG-UNIT PO CHEW: 1.0000 | CHEWABLE_TABLET | Freq: Two times a day (BID) | ORAL | Status: DC

## 2014-03-09 MED ORDER — ASPIRIN EC 325 MG PO TBEC
325.0000 mg | DELAYED_RELEASE_TABLET | Freq: Every day | ORAL | Status: DC
  Administered 2014-03-09 – 2014-03-13 (×5): 325 mg via ORAL
  Filled 2014-03-09 (×6): qty 1

## 2014-03-09 MED ORDER — VITAMIN B-12 1000 MCG PO TABS
5000.0000 ug | ORAL_TABLET | Freq: Every day | ORAL | Status: DC
Start: 2014-03-10 — End: 2014-03-13
  Administered 2014-03-10 – 2014-03-13 (×4): 5000 ug via ORAL
  Filled 2014-03-09 (×4): qty 5

## 2014-03-09 MED ORDER — ONDANSETRON HCL 4 MG PO TABS: 4.0000 mg | ORAL_TABLET | Freq: Four times a day (QID) | ORAL | Status: DC | PRN

## 2014-03-09 MED ORDER — SODIUM CHLORIDE 0.9 % IJ SOLN
3.0000 mL | Freq: Two times a day (BID) | INTRAMUSCULAR | Status: DC
  Administered 2014-03-09 – 2014-03-12 (×6): 3 mL via INTRAVENOUS

## 2014-03-09 MED ORDER — CEFTRIAXONE SODIUM 1 G IJ SOLR
1.0000 g | Freq: Once | INTRAMUSCULAR | Status: AC
Start: 2014-03-09 — End: 2014-03-09
  Administered 2014-03-09: 1 g via INTRAVENOUS
  Filled 2014-03-09: qty 10

## 2014-03-09 MED ORDER — ONDANSETRON HCL 4 MG/2ML IJ SOLN: 4.0000 mg | Freq: Four times a day (QID) | INTRAMUSCULAR | Status: DC | PRN

## 2014-03-09 MED ORDER — POLYETHYLENE GLYCOL 3350 17 G PO PACK
17.0000 g | PACK | Freq: Every day | ORAL | Status: DC
  Administered 2014-03-09 – 2014-03-13 (×5): 17 g via ORAL
  Filled 2014-03-09 (×5): qty 1

## 2014-03-09 MED ORDER — ADULT MULTIVITAMIN W/MINERALS CH
1.0000 | ORAL_TABLET | Freq: Every day | ORAL | Status: DC
  Administered 2014-03-09 – 2014-03-13 (×5): 1 via ORAL
  Filled 2014-03-09 (×5): qty 1

## 2014-03-09 MED ORDER — HYDROCODONE-ACETAMINOPHEN 5-325 MG PO TABS: 1.0000 | ORAL_TABLET | ORAL | Status: DC | PRN

## 2014-03-09 MED ORDER — HYDRALAZINE HCL 25 MG PO TABS
12.5000 mg | ORAL_TABLET | Freq: Two times a day (BID) | ORAL | Status: DC
  Administered 2014-03-09 – 2014-03-13 (×9): 12.5 mg via ORAL
  Filled 2014-03-09 (×13): qty 0.5

## 2014-03-09 MED ORDER — SODIUM CHLORIDE 0.9 % IJ SOLN
3.0000 mL | Freq: Two times a day (BID) | INTRAMUSCULAR | Status: DC
  Administered 2014-03-09 – 2014-03-12 (×7): 3 mL via INTRAVENOUS

## 2014-03-09 MED ORDER — LEVOTHYROXINE SODIUM 25 MCG PO TABS
25.0000 ug | ORAL_TABLET | Freq: Every day | ORAL | Status: DC
  Administered 2014-03-09 – 2014-03-13 (×5): 25 ug via ORAL
  Filled 2014-03-09 (×7): qty 1

## 2014-03-09 MED ORDER — DEXTROSE 5 % IV SOLN
1.0000 g | INTRAVENOUS | Status: DC
  Administered 2014-03-09 – 2014-03-12 (×4): 1 g via INTRAVENOUS
  Filled 2014-03-09 (×5): qty 10

## 2014-03-09 MED ORDER — BOOST PO LIQD
90.0000 mL | Freq: Two times a day (BID) | ORAL | Status: DC
Start: 2014-03-09 — End: 2014-03-13
  Administered 2014-03-09 – 2014-03-13 (×9): 90 mL via ORAL
  Filled 2014-03-09 (×11): qty 237

## 2014-03-09 MED ORDER — ROTIGOTINE 2 MG/24HR TD PT24
1.0000 | MEDICATED_PATCH | Freq: Every day | TRANSDERMAL | Status: DC
  Filled 2014-03-09: qty 1

## 2014-03-09 MED ORDER — ENOXAPARIN SODIUM 40 MG/0.4ML ~~LOC~~ SOLN
40.0000 mg | Freq: Every day | SUBCUTANEOUS | Status: DC
  Administered 2014-03-09 – 2014-03-13 (×5): 40 mg via SUBCUTANEOUS
  Filled 2014-03-09 (×5): qty 0.4

## 2014-03-09 MED ORDER — VITAMIN B-12 2500 MCG SL SUBL: 5000.0000 ug | SUBLINGUAL_TABLET | Freq: Every day | SUBLINGUAL | Status: DC

## 2014-03-09 MED ORDER — ACETAMINOPHEN 500 MG PO TABS
1000.0000 mg | ORAL_TABLET | Freq: Three times a day (TID) | ORAL | Status: DC
  Administered 2014-03-09 – 2014-03-13 (×13): 1000 mg via ORAL
  Filled 2014-03-09 (×16): qty 2

## 2014-03-09 MED ORDER — CALCIUM CARBONATE-VITAMIN D 500-200 MG-UNIT PO TABS
1.0000 | ORAL_TABLET | Freq: Two times a day (BID) | ORAL | Status: DC
  Filled 2014-03-09 (×2): qty 1

## 2014-03-09 MED ORDER — FERROUS SULFATE 325 (65 FE) MG PO TABS
325.0000 mg | ORAL_TABLET | ORAL | Status: DC
  Administered 2014-03-09 – 2014-03-13 (×3): 325 mg via ORAL
  Filled 2014-03-09 (×3): qty 1

## 2014-03-09 MED ORDER — SODIUM CHLORIDE 0.9 % IV SOLN: 250.0000 mL | INTRAVENOUS | Status: DC | PRN

## 2014-03-09 MED ORDER — HYDRALAZINE HCL 20 MG/ML IJ SOLN
5.0000 mg | Freq: Four times a day (QID) | INTRAMUSCULAR | Status: DC | PRN
  Administered 2014-03-09 – 2014-03-10 (×2): 5 mg via INTRAVENOUS
  Filled 2014-03-09 (×2): qty 1

## 2014-03-09 MED ORDER — ATORVASTATIN CALCIUM 10 MG PO TABS
10.0000 mg | ORAL_TABLET | Freq: Every day | ORAL | Status: DC
  Administered 2014-03-09 – 2014-03-13 (×5): 10 mg via ORAL
  Filled 2014-03-09 (×6): qty 1

## 2014-03-09 MED ORDER — MEMANTINE HCL 10 MG PO TABS
10.0000 mg | ORAL_TABLET | Freq: Two times a day (BID) | ORAL | Status: DC
Start: 2014-03-09 — End: 2014-03-13
  Administered 2014-03-09 – 2014-03-13 (×9): 10 mg via ORAL
  Filled 2014-03-09 (×11): qty 1

## 2014-03-09 MED ORDER — CALCIUM CARBONATE-VITAMIN D 500-200 MG-UNIT PO TABS
1.0000 | ORAL_TABLET | Freq: Two times a day (BID) | ORAL | Status: DC
  Administered 2014-03-10 – 2014-03-13 (×8): 1 via ORAL
  Filled 2014-03-09 (×9): qty 1

## 2014-03-09 MED ORDER — LIDOCAINE 5 % EX PTCH
2.0000 | MEDICATED_PATCH | Freq: Every day | CUTANEOUS | Status: DC | PRN
  Filled 2014-03-09: qty 2

## 2014-03-09 MED ORDER — D-MANNOSE PO POWD: 2000.0000 mg | Freq: Two times a day (BID) | ORAL | Status: DC

## 2014-03-09 MED ORDER — SODIUM CHLORIDE 0.9 % IJ SOLN: 3.0000 mL | INTRAMUSCULAR | Status: DC | PRN

## 2014-03-09 NOTE — Evaluation (Signed)
Physical Therapy Evaluation Patient Details Name: Ricky [name] MRN: [id number] DOB: 1916-12-22 Today's Date: 03/09/2014   History of Present Illness  Ricky [name] is a 80 y.o. male with a history of progressive supranuclear palsy with dementia, hypertension, syncope, hypothyroidism presenting with diffuse weakness, multiple falls. Fall occurred prior to arrival.  Patient's son is concerned for progressively worsening weakness, frequent falls, worsening recently. Occurred at home. History obtained from ED notes, as pt is poor historian, answering "yes ma'am" to all questions.    Clinical Impression  Pt admitted with the above. Pt currently with functional limitations due to the deficits listed below (see PT Problem List). At the time of PT eval, pt demonstrating minimal participation during transfers and bed mobility. Recommend +2 assist for next therapy session. Pt will benefit from skilled PT to increase their independence and safety with mobility to allow discharge to the venue listed below.      Follow Up Recommendations SNF;Supervision/Assistance - 24 hour    Equipment Recommendations  Rolling walker with 5" wheels    Recommendations for Other Services       Precautions / Restrictions Precautions Precautions: Fall Restrictions Weight Bearing Restrictions: No      Mobility  Bed Mobility Overal bed mobility: Needs Assistance Bed Mobility: Rolling;Sidelying to Sit Rolling: Max assist Sidelying to sit: Max assist       General bed mobility comments: Pt required max assist for all bed mobility, including scooting HOB. Would recommend +2 assist for future transfers to EOB, as bed pad was used primarily for scooting and HOB was elevated.  Transfers                 General transfer comment: Unable to attempt. Pt sitting EOB and states he can stand up, however is not able to initiate movement to attempt transfer to standing.   Ambulation/Gait                 Stairs            Wheelchair Mobility    Modified Rankin (Stroke Patients Only)       Balance Overall balance assessment: Needs assistance Sitting-balance support: Feet supported;No upper extremity supported Sitting balance-Leahy Scale: Poor Sitting balance - Comments: VC's for improved posture sitting EOB. Able to hold for a short time (~30 seconds) and then begins to lean backwards.  Postural control: Posterior lean                                   Pertinent Vitals/Pain Pt does not appear SOB throughout session. Reports no complaints.    Home Living Family/patient expects to be discharged to:: Skilled nursing facility                 Additional Comments: unable to obtain history or prior level of function from the patient and no caregiver available.    Prior Function Level of Independence: Needs assistance   Gait / Transfers Assistance Needed: pt reports son walks with him     Comments: unclear of PLOF due to no family present, and pt not clear with questions asked. PLOF entered from previous admission.      Hand Dominance        Extremity/Trunk Assessment   Upper Extremity Assessment: RUE deficits/detail;LUE deficits/detail RUE Deficits / Details: Appeared to have bilateral elbow flexor contractures, as pt was unable to extend at elbow. Unclear if this was  a true contracture or if pt was actively resisting therapist during ROM exam.         Lower Extremity Assessment: Generalized weakness      Cervical / Trunk Assessment: Kyphotic (forward head posture)  Communication   Communication: HOH  Cognition Arousal/Alertness: Lethargic Behavior During Therapy: Flat affect Overall Cognitive Status: No family/caregiver present to determine baseline cognitive functioning                      General Comments      Exercises        Assessment/Plan    PT Assessment Patient needs continued PT services  PT Diagnosis  Generalized weakness   PT Problem List Decreased strength;Decreased range of motion;Decreased activity tolerance;Decreased balance;Decreased mobility;Decreased knowledge of use of DME;Decreased safety awareness  PT Treatment Interventions DME instruction;Gait training;Stair training;Functional mobility training;Therapeutic activities;Therapeutic exercise;Neuromuscular re-education;Patient/family education   PT Goals (Current goals can be found in the Care Plan section) Acute Rehab PT Goals Patient Stated Goal: Pt did not state goals during session. Unable - only responds "Yes Ma'am" PT Goal Formulation: With patient Time For Goal Achievement: 03/23/14 Potential to Achieve Goals: Fair    Frequency Min 2X/week   Barriers to discharge Decreased caregiver support Per chart review, son feels pt may need placement    Co-evaluation               End of Session   Activity Tolerance: Patient limited by lethargy Patient left: in bed;with call bell/phone within reach Nurse Communication: Mobility status         Time: 2353-6144 PT Time Calculation (min): 17 min   Charges:   PT Evaluation $Initial PT Evaluation Tier I: 1 Procedure     PT G CodesJolyn Lent 03/09/2014, 1:27 PM  Jolyn Lent, PT, DPT Acute Rehabilitation Services Pager: [phone]

## 2014-03-09 NOTE — Progress Notes (Signed)
Pt's family visited patient this evening and inquired about pt's sacral wound. This RN passing by patient's room cleansed and changed sacral dressing. Wound consult placed. Will pass on to pt's assigned RN and continue to monitor.

## 2014-03-09 NOTE — ED Notes (Signed)
Report to [facility] on 3E.  Pt resting, no distress.  Monitor shows SR with occ unifocal PVC.  Pt to floor on monitor with NT accompanying.

## 2014-03-09 NOTE — Progress Notes (Signed)
Utilization Review Completed Beyonka Pitney J. Huxton Glaus, RN, BSN, NCM [phone]  

## 2014-03-09 NOTE — Progress Notes (Signed)
Occupational Therapy Treatment Patient Details Name: Ricky [name] MRN: [id number] DOB: Mar 14, 1917 Today's Date: 03/09/2014    History of present illness Ricky [name] is a 80 y.o. male with a history of progressive supranuclear palsy with dementia, hypertension, syncope, hypothyroidism presenting with diffuse weakness, multiple falls. Fall occurred prior to arrival.  Patient's son is concerned for progressively worsening weakness, frequent falls, worsening recently. Occurred at home. History obtained from ED notes, as pt is poor historian, answering "yes ma'am" to all questions.     OT comments  Pt with limited participation this session and family present. Family very concerned with his limited response to them. Daughter reports normally he perks up to [place]. Family declining SNF placement and will require additional (A) at home. If family declines: CM please order hoyer lift, hospital bed and aide. PTA pt was able to ambulate with hand held (A) from son to kitchen and needing w/c 25/% of the time. Pt was total (A) for LB. OT will follow acutely to address UB and sitting balance.  Follow Up Recommendations  SNF    Equipment Recommendations  Other (comment);Wheelchair cushion (measurements OT);Wheelchair (measurements OT);Hospital bed (hoyer, mattress overlay)    Recommendations for Other Services Speech consult (coughing on thin liquids during eval)    Precautions / Restrictions Precautions Precautions: Fall       Mobility Bed Mobility Overal bed mobility: + 2 for safety/equipment;Needs Assistance;+2 for physical assistance Bed Mobility: Supine to Sit;Sit to Supine Rolling: Total assist Sidelying to sit: Total assist Supine to sit: Total assist;HOB elevated Sit to supine: Max assist;+2 for physical assistance;+2 for safety/equipment   General bed mobility comments: Pt unable to follow commands or initiate even with incr time  Transfers Overall transfer level: Needs  assistance Equipment used: 2 person hand held assist Transfers: Sit to/from Stand Sit to Stand: Max assist;+2 physical assistance;+2 safety/equipment;From elevated surface         General transfer comment: total (A) with pad use for hip extension and anterior pelvic tilt. Pt total (A) in standing with bil LE blocked. Pt with a flexed forward posture. Family reports this is not baseline. PT with same flat affect and focused attention    Balance Overall balance assessment: History of Falls;Needs assistance Sitting-balance support: Feet supported;No upper extremity supported Sitting balance-Leahy Scale: Zero   Postural control: Posterior lean Standing balance support: Bilateral upper extremity supported;During functional activity Standing balance-Leahy Scale: Zero                     ADL Overall ADL's : Needs assistance/impaired Eating/Feeding: Total assistance Eating/Feeding Details (indicate cue type and reason): drinking water and after each sip coughing. Question need for SLP consult and aspiration Grooming: Total assistance   Upper Body Bathing: Total assistance   Lower Body Bathing: Total assistance   Upper Body Dressing : Total assistance   Lower Body Dressing: Total assistance   Toilet Transfer: Total assistance;BSC           Functional mobility during ADLs: +2 for physical assistance;+2 for safety/equipment;Maximal assistance General ADL Comments: Pt supine <>Sit total (A) and total +2 max (A) for sit<>Stand. pt required pad use to help faciliation hip extension and anterior pelvic tilt. Family presnt and reports at baseline using a wedge at pelvis to allow anterior tilt of pelvic area and allow son to transition to in front of patient. Son stands in front of patient and hand held (A) ambulation around house facilitating weight shift. Pt unable to  sustain static sitting this session. Family very concerned with need for further work up because pt is not acting his  normal self.       Vision                     Perception Perception Perception Tested?: No   Praxis      Cognition   Behavior During Therapy: Flat affect Overall Cognitive Status: Impaired/Different from baseline Area of Impairment: Orientation;Attention;Awareness Orientation Level: Disoriented to;Place;Person;Time;Situation Current Attention Level: Focused Memory: Decreased short-term memory      Awareness: Intellectual   General Comments: pt with limited response to family. Family reports "he has to have an infection this is not him" "he is too tight mouthed"    Extremity/Trunk Assessment  Upper Extremity Assessment Upper Extremity Assessment: Generalized weakness;RUE deficits/detail;LUE deficits/detail RUE Deficits / Details: resistive to movement with delayed response. pt able to fully extend RT UE LUE Deficits / Details: Resistive to all movement. Able to fully extend elbow   Lower Extremity Assessment Lower Extremity Assessment: Generalized weakness;Defer to PT evaluation   Cervical / Trunk Assessment Cervical / Trunk Assessment: Kyphotic    Exercises     Shoulder Instructions       General Comments      Pertinent Vitals/ Pain       Reports pain with weight shift to EOB Reports no pain supine  Home Living Family/patient expects to be discharged to:: Private residence Living Arrangements: Children Available Help at Discharge: Family Type of Home: House Home Access: Ramped entrance     Home Layout: One level     Bathroom Shower/Tub:  (sponge bath)   Bathroom Toilet:  (3n1)                Prior Functioning/Environment Level of Independence: Needs assistance  Gait / Transfers Assistance Needed: per son- pt walks with RW. Son states he has used w/c 25% of the time since Nov 17 2013 ADL's / Homemaking Assistance Needed: called son / daughter to speak regarding PTA - Pt's family reports that his hx of Progressive supranuclear palsy  (PSP) makes his ability to participate in ADLS vary daily. Pt does ambulate majority of the time with walker from bed room to kitchen. Pt requires total (A) for peri care. Pt states against washing machine holding the round drum inside and leaning shoulder against wall for support while total (A) for peri care. Son reports "we pick him up and put him in the wheelchair" Communication / Swallowing Assistance Needed: pt's son has fed patient total (A) for the last 45 days.     Frequency Min 1X/week     Progress Toward Goals  OT Goals(current goals can now be found in the care plan section)     Acute Rehab OT Goals Patient Stated Goal: none states. Family very direct in that the patient will d/c home. Family with previous admissions SNF and did not have the quality of care they prefer OT Goal Formulation: Patient unable to participate in goal setting Time For Goal Achievement: 03/23/14 Potential to Achieve Goals: Poor  Plan      Co-evaluation                 End of Session     Activity Tolerance Patient limited by lethargy;Patient limited by fatigue   Patient Left in bed;with call bell/phone within reach;with bed alarm set;with family/visitor present   Nurse Communication Mobility status;Need for lift equipment;Precautions  Time: 2376-2831 (15-14-1536) OT Time Calculation (min): 23 min  Charges: OT General Charges $OT Visit: 1 Procedure OT Evaluation $Initial OT Evaluation Tier I: 1 Procedure OT Treatments $Therapeutic Activity: 23-37 mins  Peri Maris 03/09/2014, 4:02 PM Pager: [phone]

## 2014-03-09 NOTE — Progress Notes (Addendum)
PROGRESS NOTE    Ricky [name] KYH:[id number] DOB: October 08, 1917 DOA: 03/08/2014 PCP: Hollace Kinnier, DO  HPI/Brief narrative 80 year old male patient with history of Alzheimer's/senile dementia, chronic indwelling Foley catheter, HTN, Parkinsonian syndrome, hypothyroid, gout, progressive supranuclear palsy, lives with his son, presented to the ED after an episode of fall at home. She reported progressive weakness and some interested in SNF placement. UA suggestive of UTI.   Assessment/Plan:  1. Complicated UTI/cystitis/chronic Foley: Patient denies dysuria or urinary frequency. No reported fevers or chills. Continue empiric IV Rocephin pending urine culture results. 2. Fall at home: Likely secondary to unsteady gait from advanced age, dementia and parkinsonism. PT and OT evaluation. 3. Hypertension: Fluctuating and mildly uncontrolled. Continue hydralazine and monitor. May need titration 4. History of Alzheimer's/senile dementia, parkinsonism, progressive supranuclear palsy: PT and OT evaluation for possible placement. Continue home medications 5. Failure to thrive: Secondary to above 6. Anemia: Chronic and stable. 7. L 1 compression fracture: Stable on imaging. Patient denies pain. 8. RUL pulmonary nodules on chest x-ray: Reported as stable. Outpatient followup as to when necessary 9. History of hypothyroidism: Continue Synthroid   Code Status: Full Family Communication: None at bedside Disposition Plan: To be determined   Consultants:  None  Procedures:  Foley catheter  Antibiotics:  IV Rocephin 4/23 >   Subjective: Denies complaints. Denies dysuria, urinary frequency or pain. States that he lives with his son.  Objective: Filed Vitals:   03/09/14 0200 03/09/14 0256 03/09/14 0412 03/09/14 1057  BP: 158/80 162/114 189/93 167/76  Pulse: 67 83 69   Temp:  98.3 F (36.8 C) 98 F (36.7 C)   TempSrc:  Oral Oral   Resp: 21 22 21    Height:  5\' 10"  (1.778 m)      Weight:  68.2 kg (150 lb 5.7 oz)    SpO2: 94% 95% 98%     Intake/Output Summary (Last 24 hours) at 03/09/14 1159 Last data filed at 03/09/14 0900  Gross per 24 hour  Intake     60 ml  Output    700 ml  Net   -640 ml   Filed Weights   03/08/14 2040 03/09/14 0256  Weight: 74.844 kg (165 lb) 68.2 kg (150 lb 5.7 oz)     Exam:  General exam: Pleasant elderly male lying comfortably supine in bed. Respiratory system: Clear. No increased work of breathing. Cardiovascular system: S1 & S2 heard, RRR. No JVD, murmurs, gallops, clicks or pedal edema. Telemetry: Sinus rhythm. Gastrointestinal system: Abdomen is nondistended, soft and nontender. Normal bowel sounds heard. Foley catheter + Central nervous system: Alert and oriented. No focal neurological deficits. Flat affect. Extremities: Rigidity of all extremities. Symmetric normal appearing power.   Data Reviewed: Basic Metabolic Panel:  Recent Labs Lab 03/08/14 2259 03/09/14 0409  NA 138 138  K 4.4 4.4  CL 100 100  CO2 25 25  GLUCOSE 91 93  BUN 11 9  CREATININE 0.82 0.77  CALCIUM 9.2 9.2   Liver Function Tests:  Recent Labs Lab 03/08/14 2259  AST 20  ALT 13  ALKPHOS 78  BILITOT 0.3  PROT 7.9  ALBUMIN 3.6   No results found for this basename: LIPASE, AMYLASE,  in the last 168 hours No results found for this basename: AMMONIA,  in the last 168 hours CBC:  Recent Labs Lab 03/08/14 2259 03/09/14 0409  WBC 6.5 6.7  NEUTROABS 4.7  --   HGB 11.1* 10.7*  HCT 34.9* 33.3*  MCV 89.5 88.6  PLT 258 266   Cardiac Enzymes: No results found for this basename: CKTOTAL, CKMB, CKMBINDEX, TROPONINI,  in the last 168 hours BNP (last 3 results) No results found for this basename: PROBNP,  in the last 8760 hours CBG: No results found for this basename: GLUCAP,  in the last 168 hours  Recent Results (from the past 240 hour(s))  MRSA PCR SCREENING     Status: None   Collection Time    03/09/14  2:49 AM      Result  Value Ref Range Status   MRSA by PCR NEGATIVE  NEGATIVE Final   Comment:            The GeneXpert MRSA Assay (FDA     approved for NASAL specimens     only), is one component of a     comprehensive MRSA colonization     surveillance program. It is not     intended to diagnose MRSA     infection nor to guide or     monitor treatment for     MRSA infections.      Additional labs: 1. TSH: 1.49     Studies: Dg Chest 1 View  03/08/2014   CLINICAL DATA:  Fall  EXAM: CHEST - 1 VIEW  COMPARISON:  DG THORACIC SPINE dated 03/08/2014; DG CHEST 2 VIEW dated 01/24/2014  FINDINGS: Stable right upper lobe lung nodules. Lungs are under aerated with scattered volume loss. Cardiomegaly. No pneumothorax.  IMPRESSION: Stable right upper lobe nodules.  Low volumes.   Electronically Signed   By: Maryclare Bean M.D.   On: 03/08/2014 22:58   Dg Cervical Spine 2-3 Views  03/08/2014   CLINICAL DATA:  Fall  EXAM: CERVICAL SPINE - 2-3 VIEW  COMPARISON:  None.  FINDINGS: Only a single lateral cervical spine view is submitted. There is reversed cervical lordosis. There is severe narrowing at virtually every level in the cervical spine. The image is rotated. Detail is obscured by positioning. Prevertebral soft tissues are borderline prominent. This may be due to positioning.  IMPRESSION: Limited exam as described. Injury cannot be excluded by this study. Advanced degenerative changes. Prevertebral soft tissues are borderline prominent.   Electronically Signed   By: Maryclare Bean M.D.   On: 03/08/2014 23:00   Dg Thoracic Spine 2 View  03/08/2014   CLINICAL DATA:  Fall  EXAM: THORACIC SPINE - 2 VIEW  COMPARISON:  DG CHEST 2 VIEW dated 01/24/2014  FINDINGS: Stable L1 compression deformity. Osteopenia. Kyphosis. No definite new compression deformities.  IMPRESSION: Stable L1 compression fracture.   Electronically Signed   By: Maryclare Bean M.D.   On: 03/08/2014 22:56   Dg Lumbar Spine Complete  03/08/2014   CLINICAL DATA:  Fall  EXAM:  LUMBAR SPINE - COMPLETE 4+ VIEW  COMPARISON:  DG LUMBAR SPINE 2-3 VIEWS dated 01/15/2014  FINDINGS: Stable L1 compression fracture. Osteopenia. No new compression deformities. Degenerative changes are noted.  IMPRESSION: No acute bony pathology.  Stable L1 compression fracture.   Electronically Signed   By: Maryclare Bean M.D.   On: 03/08/2014 22:57   Dg Elbow 2 Views Right  03/08/2014   CLINICAL DATA:  Fall  EXAM: RIGHT ELBOW - 2 VIEW  COMPARISON:  None.  FINDINGS: No acute fracture.  No dislocation.  IMPRESSION: No acute bony pathology.   Electronically Signed   By: Maryclare Bean M.D.   On: 03/08/2014 22:56        Scheduled Meds: . acetaminophen  1,000 mg Oral 3 times per day  . aspirin EC  325 mg Oral Daily  . atorvastatin  10 mg Oral q1800  . cefTRIAXone (ROCEPHIN)  IV  1 g Intravenous Q24H  . enoxaparin (LOVENOX) injection  40 mg Subcutaneous Daily  . ferrous sulfate  325 mg Oral QODAY  . hydrALAZINE  12.5 mg Oral BID  . levothyroxine  25 mcg Oral QAC breakfast  . memantine  10 mg Oral BID  . sodium chloride  3 mL Intravenous Q12H  . sodium chloride  3 mL Intravenous Q12H   Continuous Infusions:   Active Problems:   Weakness   Fall    Time spent: 25 minutes    Modena Jansky, MD, FACP, [facility]. [facility] Pager [phone]  If 7PM-7AM, please contact night-coverage [email or web address] Password [email or web address] 03/09/2014, 11:59 AM    LOS: 1 day

## 2014-03-09 NOTE — ED Provider Notes (Signed)
CSN: [id number]     Arrival date & time 03/08/14  2032 History   First MD Initiated Contact with Patient 03/08/14 2104     Chief Complaint  Patient presents with  . Fall     (Consider location/radiation/quality/duration/timing/severity/associated sxs/prior Treatment) HPI  Following history is obtained from the patient's son, who is at bedside, as the patient is unable to provide reliable history:  Ricky [name] is a 80 y.o. male with a history of progressive supranuclear palsy with dementia, hypertension, syncope, hypothyroidism presenting with diffuse weakness, multiple falls. Fall occurred prior to arrival.  Patient's son is concerned for progressively worsening weakness, frequent falls, worsening recently. Occurred at home.    Past Medical History  Diagnosis Date  . IBS (irritable bowel syndrome)   . Diverticulosis   . Cataract   . OAB (overactive bladder)   . Macular degeneration   . Alzheimer disease   . Hypertension   . Arthritis   . Osteoporosis   . Parkinsonian syndrome   . Syncope, vasovagal   . Neuromuscular disorder     parkisonian syndrome  . 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 Unspecified transient cerebral ischemia   . Unspecified urinary incontinence   . Senile dementia, uncomplicated   . Unspecified constipation   . Hypothyroidism   . Anemia, unspecified   . Depression   . Alzheimer's disease   . Gout, unspecified   . Thoracic or lumbosacral neuritis or radiculitis, unspecified   . Progressive supranuclear palsy   . Unspecified transient cerebral ischemia   . Unspecified urinary incontinence   . Syncope and collapse   . Paralysis agitans   . Unspecified transient cerebral ischemia   . Pneumonitis due to inhalation of food or vomitus   . Cellulitis and abscess of oral soft tissues   . Screening for lipoid disorders   . Senile dementia, uncomplicated   . Unspecified constipation   . Disorder of bone and cartilage,  unspecified   . Other specified acquired hypothyroidism   . Unspecified hypothyroidism   . Anemia, unspecified   . Depressive disorder, not elsewhere classified   . Alzheimer's disease   . Paralysis agitans   . Pneumonia, organism unspecified   . Pseudomonas infection in conditions classified elsewhere and of unspecified site   . Unspecified essential hypertension   . Urinary tract infection, site not specified   . Osteoarthrosis, unspecified whether generalized or localized, unspecified site   . Osteoporosis, unspecified   . Gout, unspecified   . Thoracic or lumbosacral neuritis or radiculitis, unspecified   . Stroke    Past Surgical History  Procedure Laterality Date  . Esophagogastroduodenoscopy    . Eye surgery  2006    LEFT CATARACT   Family History  Problem Relation Age of Onset  . Cancer Mother   . Stroke Father   . Liver disease Father   . Kidney disease Brother   . Liver disease Brother   . Emphysema Brother    History  Substance Use Topics  . Smoking status: Former Research scientist (life sciences)  . Smokeless tobacco: Never Used     Comment: QUIT SMOKING BACK IN THE LATE 50'S  . Alcohol Use: No    Review of Systems  Constitutional: Negative for chills.  HENT: Negative for facial swelling.   Eyes: Negative for pain.  Respiratory: Negative for shortness of breath.   Cardiovascular: Negative for chest pain.  Gastrointestinal: Negative for nausea and vomiting.  Genitourinary: Negative for dysuria.  Musculoskeletal: Positive for back pain. Negative for arthralgias.  Neurological: Positive for weakness.  Psychiatric/Behavioral: Negative for behavioral problems.      Allergies  Other  Home Medications   Prior to Admission medications   Medication Sig Start Date End Date Taking? Authorizing Provider  acetaminophen (TYLENOL) 500 MG tablet Take 1,000 mg by mouth 3 (three) times daily.    Yes Historical Provider, MD  aspirin EC 325 MG EC tablet Take 1 tablet (325 mg total) by  mouth daily. 02/03/13  Yes Ripudeep Krystal Eaton, MD  atorvastatin (LIPITOR) 10 MG tablet Take 1 tablet (10 mg total) by mouth daily at 6 PM. 08/29/13 08/29/14 Yes Tiffany L Reed, DO  bacitracin ointment Apply 1 application topically 2 (two) times daily. Applied to tip of penis for skin tear.   Yes Historical Provider, MD  Calcium Carbonate-Vit D-Min (CALTRATE 600+D PLUS) 600-800 MG-UNIT CHEW Chew 1 tablet by mouth 2 (two) times daily with a meal.    Yes Historical Provider, MD  collagenase (SANTYL) ointment Apply 1 application topically daily. 03/02/14  Yes Tiffany L Reed, DO  CRANBERRY PO Take 4,200 mg by mouth 3 (three) times daily.   Yes Historical Provider, MD  Cyanocobalamin (VITAMIN B-12) 2500 MCG SUBL Place 5,000 mcg under the tongue daily.    Yes Historical Provider, MD  D-Mannose POWD Take 2,000 mg by mouth 2 (two) times daily.   Yes Historical Provider, MD  ferrous sulfate 325 (65 FE) MG tablet Take 325 mg by mouth every other day.    Yes Historical Provider, MD  hydrALAZINE (APRESOLINE) 25 MG tablet Take 0.5 tablets (12.5 mg total) by mouth 2 (two) times daily. 08/29/13  Yes Tiffany L Reed, DO  lactose free nutrition (BOOST) LIQD Take 90 mLs by mouth 2 (two) times daily between meals.    Yes Historical Provider, MD  levothyroxine (SYNTHROID, LEVOTHROID) 25 MCG tablet Take 25 mcg by mouth daily before breakfast.   Yes Historical Provider, MD  lidocaine (LIDODERM) 5 % Place 2 patches onto the skin daily as needed (for back pain). Remove & Discard patch within 12 hours or as directed by MD 09/16/13  Yes Tiffany L Reed, DO  MELATONIN EXTRA STRENGTH PO Take 15 mLs by mouth at bedtime as needed (sleep).    Yes Historical Provider, MD  memantine (NAMENDA) 10 MG tablet Take 10 mg by mouth 2 (two) times daily.   Yes Historical Provider, MD  Multiple Vitamin (MULTIVITAMIN WITH MINERALS) TABS Take 1 tablet by mouth daily.   Yes Historical Provider, MD  NEUPRO 2 MG/24HR Place 1 patch onto the skin daily.   01/24/14  Yes Historical Provider, MD  polyethylene glycol (MIRALAX / GLYCOLAX) packet Take 17 g by mouth daily.   Yes Historical Provider, MD   BP 153/75  Pulse 71  Temp(Src) 98.7 F (37.1 C) (Oral)  Resp 20  Ht 5\' 10"  (1.778 m)  Wt 165 lb (74.844 kg)  BMI 23.68 kg/m2  SpO2 94% Physical Exam  Constitutional: He is oriented to person, place, and time. He appears well-developed and well-nourished. No distress.  HENT:  Head: Normocephalic and atraumatic.  Mouth/Throat: No oropharyngeal exudate.  Eyes: Conjunctivae are normal. Pupils are equal, round, and reactive to light. No scleral icterus.  Neck: Normal range of motion. No tracheal deviation present. No thyromegaly present.  Cardiovascular: Normal rate, regular rhythm and normal heart sounds.  Exam reveals no gallop and no friction rub.   No murmur heard. Pulmonary/Chest: Effort normal and breath sounds  normal. No stridor. No respiratory distress. He has no wheezes. He has no rales. He exhibits no tenderness.  Abdominal: Soft. He exhibits no distension and no mass. There is no tenderness. There is no rebound and no guarding.  Musculoskeletal: Normal range of motion. He exhibits no edema.  Patient back significantly kyphotic  Neurological: He is alert and oriented to person, place, and time. GCS eye subscore is 4. GCS verbal subscore is 4. GCS motor subscore is 6.  Skin: Skin is warm and dry. He is not diaphoretic.    ED Course  Procedures (including critical care time) Labs Review Labs Reviewed  URINALYSIS, ROUTINE W REFLEX MICROSCOPIC - Abnormal; Notable for the following:    APPearance CLOUDY (*)    Hgb urine dipstick SMALL (*)    Nitrite POSITIVE (*)    Leukocytes, UA LARGE (*)    All other components within normal limits  CBC WITH DIFFERENTIAL - Abnormal; Notable for the following:    RBC 3.90 (*)    Hemoglobin 11.1 (*)    HCT 34.9 (*)    All other components within normal limits  URINE MICROSCOPIC-ADD ON - Abnormal;  Notable for the following:    Bacteria, UA MANY (*)    Crystals TRIPLE PHOSPHATE CRYSTALS (*)    All other components within normal limits  URINE CULTURE  TSH  COMPREHENSIVE METABOLIC PANEL    Imaging Review Dg Chest 1 View  03/08/2014   CLINICAL DATA:  Fall  EXAM: CHEST - 1 VIEW  COMPARISON:  DG THORACIC SPINE dated 03/08/2014; DG CHEST 2 VIEW dated 01/24/2014  FINDINGS: Stable right upper lobe lung nodules. Lungs are under aerated with scattered volume loss. Cardiomegaly. No pneumothorax.  IMPRESSION: Stable right upper lobe nodules.  Low volumes.   Electronically Signed   By: Maryclare Bean M.D.   On: 03/08/2014 22:58   Dg Cervical Spine 2-3 Views  03/08/2014   CLINICAL DATA:  Fall  EXAM: CERVICAL SPINE - 2-3 VIEW  COMPARISON:  None.  FINDINGS: Only a single lateral cervical spine view is submitted. There is reversed cervical lordosis. There is severe narrowing at virtually every level in the cervical spine. The image is rotated. Detail is obscured by positioning. Prevertebral soft tissues are borderline prominent. This may be due to positioning.  IMPRESSION: Limited exam as described. Injury cannot be excluded by this study. Advanced degenerative changes. Prevertebral soft tissues are borderline prominent.   Electronically Signed   By: Maryclare Bean M.D.   On: 03/08/2014 23:00   Dg Thoracic Spine 2 View  03/08/2014   CLINICAL DATA:  Fall  EXAM: THORACIC SPINE - 2 VIEW  COMPARISON:  DG CHEST 2 VIEW dated 01/24/2014  FINDINGS: Stable L1 compression deformity. Osteopenia. Kyphosis. No definite new compression deformities.  IMPRESSION: Stable L1 compression fracture.   Electronically Signed   By: Maryclare Bean M.D.   On: 03/08/2014 22:56   Dg Lumbar Spine Complete  03/08/2014   CLINICAL DATA:  Fall  EXAM: LUMBAR SPINE - COMPLETE 4+ VIEW  COMPARISON:  DG LUMBAR SPINE 2-3 VIEWS dated 01/15/2014  FINDINGS: Stable L1 compression fracture. Osteopenia. No new compression deformities. Degenerative changes are noted.   IMPRESSION: No acute bony pathology.  Stable L1 compression fracture.   Electronically Signed   By: Maryclare Bean M.D.   On: 03/08/2014 22:57   Dg Elbow 2 Views Right  03/08/2014   CLINICAL DATA:  Fall  EXAM: RIGHT ELBOW - 2 VIEW  COMPARISON:  None.  FINDINGS:  No acute fracture.  No dislocation.  IMPRESSION: No acute bony pathology.   Electronically Signed   By: Maryclare Bean M.D.   On: 03/08/2014 22:56     EKG Interpretation   Date/Time:  Wednesday March 08 2014 23:19:54 EDT Ventricular Rate:  71 PR Interval:    QRS Duration: 133 QT Interval:  443 QTC Calculation: 481 R Axis:   -20 Text Interpretation:  Accelerated junctional rhythm Right bundle branch  block Artifact in lead(s) I II III aVR aVL V1 V2 No significant change was  found Confirmed by CAMPOS  MD, Lennette Bihari ([id number]) on 03/09/2014 12:36:22 AM      MDM   Final diagnoses:  UTI (lower urinary tract infection)  Weakness    Following history is obtained from the patient's son, who is at bedside, as the patient is unable to provide reliable history:  Ricky [name] is a 80 y.o. male with a history of progressive supranuclear palsy with dementia, hypertension, syncope, hypothyroidism presenting with diffuse weakness, multiple falls. Fall occurred prior to arrival.  Patient's son is concerned for progressively worsening weakness, frequent falls, worsening recently. Occurred at home.    The patient denies pain in head, neck, back, abdomen, chest.  UA reveals UTI, although patient has indwelling urinary catheter. TSH is within normal limits. Still awaiting metabolic panel. CBC within normal limits. Chest x-ray, x-rays of the spine are within normal limits. Considering recent increase in frequency of falls, diffuse weakness, will admit the patient to hospitalist service. He remained stable at this time.  I have discussed case and care has been guided by my attending physician, Dr. Venora Maples.  Doy Hutching, MD 03/09/14 [phone]

## 2014-03-09 NOTE — Plan of Care (Signed)
Problem: Phase I Progression Outcomes Goal: Voiding-avoid urinary catheter unless indicated Outcome: Not Progressing o   Chronic foley cath in use

## 2014-03-09 NOTE — H&P (Signed)
[facility] History and Physical  Ricky [name] OHY:[id number] DOB: 1917-06-14 DOA: 03/08/2014  Referring physician: ED physician PCP: Hollace Kinnier, DO   Chief Complaint: fall  HPI:  80 y.o. male with a history of progressive supranuclear palsy with dementia, hypertension, syncope, hypothyroidism presenting to [facility] ED after sustaining an episode of fall at home. His son has initially available at bedside and provided history but currently he is not here and pt is unable to provide all details. Most of the information is obtained from available records. Fall happened earlier today PTA and son was apparently concerned with progressive weakness and was interested in placement. Pt currently able to report no chest pain, no shortness of breath, no abdominal or urinary concerns.  In ED, pt noted to be hemodynamically stable, [facility] asked to admit for further evaluation of fall. Telemetry bed requested.   Assessment and Plan: Active Problems:   Fall  - likely from progressive FTT and deconditioning, ? New UTI as suggested by UA  - will admit to telemetry bed - provide supportive care with IVF, analgesia, antiemetics as needed - treat with empiric ABX for UTI - PT/OT evaluation    UTI - suggested based on UA - follow upon urine culture - place on empiric Rocephin    HTN - continue home medical regime   HLD  - continue statin   Parkinsonss dementia - will likely need placement upon discharge    Radiological Exams on Admission: Dg Chest 1 View  03/08/2014   Stable right upper lobe nodules.  Low volumes.   Dg Cervical Spine 2-3 Views   03/08/2014   Injury cannot be excluded by this study. Advanced degenerative changes. Prevertebral soft tissues are borderline prominent.   Dg Thoracic Spine 2 View  03/08/2014  Stable L1 compression fracture.   Dg Lumbar Spine Complete  03/08/2014   No acute bony pathology.  Stable L1 compression fracture.   Dg Elbow 2 Views Right   03/08/2014    No acute  bony pathology.     Code Status: Full Family Communication: No family at bedside  Disposition Plan: Admit for further evaluation     Review of Systems:  Constitutional: Negative for fever, chills and malaise/fatigue. Negative for diaphoresis.  HENT: Negative for hearing loss, ear pain, nosebleeds, congestion, sore throat, neck pain, tinnitus and ear discharge.   Eyes: Negative for blurred vision, double vision, photophobia, pain, discharge and redness.  Respiratory: Negative for cough, hemoptysis, sputum production, shortness of breath, wheezing and stridor.   Cardiovascular: Negative for chest pain, palpitations, orthopnea, claudication and leg swelling.  Gastrointestinal: Negative for heartburn, constipation, blood in stool and melena.  Genitourinary: Negative for dysuria, urgency, frequency, hematuria and flank pain.  Musculoskeletal: Negative for myalgias, back pain.  Skin: Negative for itching and rash.  Neurological: Negative for tingling, tremors, sensory change, speech change, focal weakness, loss of consciousness and headaches.  Endo/Heme/Allergies: Negative for environmental allergies and polydipsia. Does not bruise/bleed easily.  Psychiatric/Behavioral: Negative for suicidal ideas. The patient is not nervous/anxious.      Past Medical History  Diagnosis Date  . IBS (irritable bowel syndrome)   . Diverticulosis   . Cataract   . OAB (overactive bladder)   . Macular degeneration   . Alzheimer disease   . Hypertension   . Arthritis   . Osteoporosis   . Parkinsonian syndrome   . Syncope, vasovagal   . Neuromuscular disorder     parkisonian syndrome  . Dementia due to Parkinson's  disease without behavioral disturbance   . Unspecified transient cerebral ischemia   . Unspecified urinary incontinence   . Senile dementia, uncomplicated   . Unspecified constipation   . Hypothyroidism   . Anemia, unspecified   . Depression   . Alzheimer's disease   . Gout, unspecified    . Thoracic or lumbosacral neuritis or radiculitis, unspecified   . Progressive supranuclear palsy   . Unspecified transient cerebral ischemia   . Unspecified urinary incontinence   . Syncope and collapse   . Paralysis agitans   . Unspecified transient cerebral ischemia   . Pneumonitis due to inhalation of food or vomitus   . Cellulitis and abscess of oral soft tissues   . Screening for lipoid disorders   . Senile dementia, uncomplicated   . Unspecified constipation   . Disorder of bone and cartilage, unspecified   . Other specified acquired hypothyroidism   . Unspecified hypothyroidism   . Anemia, unspecified   . Depressive disorder, not elsewhere classified   . Alzheimer's disease   . Paralysis agitans   . Pneumonia, organism unspecified   . Pseudomonas infection in conditions classified elsewhere and of unspecified site   . Unspecified essential hypertension   . Urinary tract infection, site not specified   . Osteoarthrosis, unspecified whether generalized or localized, unspecified site   . Osteoporosis, unspecified   . Gout, unspecified   . Thoracic or lumbosacral neuritis or radiculitis, unspecified   . Stroke     Past Surgical History  Procedure Laterality Date  . Esophagogastroduodenoscopy    . Eye surgery  2006    LEFT CATARACT    Social History:  reports that he has quit smoking. He has never used smokeless tobacco. He reports that he does not drink alcohol or use illicit drugs.  Allergies  Allergen Reactions  . Other Other (See Comments)    Dairy products- cause swallowing difficulty     Family History  Problem Relation Age of Onset  . Cancer Mother   . Stroke Father   . Liver disease Father   . Kidney disease Brother   . Liver disease Brother   . Emphysema Brother     Prior to Admission medications   Medication Sig Start Date End Date Taking? Authorizing Provider  acetaminophen (TYLENOL) 500 MG tablet Take 1,000 mg by mouth 3 (three) times daily.     Yes Historical Provider, MD  aspirin EC 325 MG EC tablet Take 1 tablet (325 mg total) by mouth daily. 02/03/13  Yes Ripudeep Krystal Eaton, MD  atorvastatin (LIPITOR) 10 MG tablet Take 1 tablet (10 mg total) by mouth daily at 6 PM. 08/29/13 08/29/14 Yes Tiffany L Reed, DO  bacitracin ointment Apply 1 application topically 2 (two) times daily. Applied to tip of penis for skin tear.   Yes Historical Provider, MD  Calcium Carbonate-Vit D-Min (CALTRATE 600+D PLUS) 600-800 MG-UNIT CHEW Chew 1 tablet by mouth 2 (two) times daily with a meal.    Yes Historical Provider, MD  collagenase (SANTYL) ointment Apply 1 application topically daily. 03/02/14  Yes Tiffany L Reed, DO  CRANBERRY PO Take 4,200 mg by mouth 3 (three) times daily.   Yes Historical Provider, MD  Cyanocobalamin (VITAMIN B-12) 2500 MCG SUBL Place 5,000 mcg under the tongue daily.    Yes Historical Provider, MD  D-Mannose POWD Take 2,000 mg by mouth 2 (two) times daily.   Yes Historical Provider, MD  ferrous sulfate 325 (65 FE) MG tablet Take 325 mg by  mouth every other day.    Yes Historical Provider, MD  hydrALAZINE (APRESOLINE) 25 MG tablet Take 0.5 tablets (12.5 mg total) by mouth 2 (two) times daily. 08/29/13  Yes Tiffany L Reed, DO  lactose free nutrition (BOOST) LIQD Take 90 mLs by mouth 2 (two) times daily between meals.    Yes Historical Provider, MD  levothyroxine (SYNTHROID, LEVOTHROID) 25 MCG tablet Take 25 mcg by mouth daily before breakfast.   Yes Historical Provider, MD  lidocaine (LIDODERM) 5 % Place 2 patches onto the skin daily as needed (for back pain). Remove & Discard patch within 12 hours or as directed by MD 09/16/13  Yes Tiffany L Reed, DO  MELATONIN EXTRA STRENGTH PO Take 15 mLs by mouth at bedtime as needed (sleep).    Yes Historical Provider, MD  memantine (NAMENDA) 10 MG tablet Take 10 mg by mouth 2 (two) times daily.   Yes Historical Provider, MD  Multiple Vitamin (MULTIVITAMIN WITH MINERALS) TABS Take 1 tablet by mouth  daily.   Yes Historical Provider, MD  NEUPRO 2 MG/24HR Place 1 patch onto the skin daily.  01/24/14  Yes Historical Provider, MD  polyethylene glycol (MIRALAX / GLYCOLAX) packet Take 17 g by mouth daily.   Yes Historical Provider, MD    Physical Exam: Filed Vitals:   03/09/14 0000 03/09/14 0030 03/09/14 0100 03/09/14 0130  BP: 165/85 153/75 164/77 162/80  Pulse: 70 71 73 71  Temp:      TempSrc:      Resp: 20 20 17 21   Height:      Weight:      SpO2: 93% 94% 95% 94%    Physical Exam  Constitutional: Appears calm, NAD HENT: Normocephalic. External right and left ear normal. Oropharynx is clear and moist.  Eyes: Conjunctivae and EOM are normal. PERRLA, no scleral icterus.  Neck: Normal ROM. Neck supple. No JVD. No tracheal deviation. No thyromegaly.  CVS: RRR, S1/S2 +, no murmurs, no gallops, no carotid bruit.  Pulmonary: Effort and breath sounds normal, no stridor, diminished breath sounds at bases  Abdominal: Soft. BS +,  no distension, tenderness, rebound or guarding.  Musculoskeletal: Normal range of motion. No edema and no tenderness.  Lymphadenopathy: No lymphadenopathy noted, cervical, inguinal. Neuro: Alert. No cranial nerve deficit. Skin: Skin is warm and dry. No rash noted. Not diaphoretic. No erythema. No pallor.  Psychiatric: Unable to assess due to dementia   Labs on Admission:  Basic Metabolic Panel: No results found for this basename: NA, K, CL, CO2, GLUCOSE, BUN, CREATININE, CALCIUM, MG, PHOS,  in the last 168 hours Liver Function Tests: No results found for this basename: AST, ALT, ALKPHOS, BILITOT, PROT, ALBUMIN,  in the last 168 hours No results found for this basename: LIPASE, AMYLASE,  in the last 168 hours No results found for this basename: AMMONIA,  in the last 168 hours CBC:  Recent Labs Lab 03/08/14 2259  WBC 6.5  NEUTROABS 4.7  HGB 11.1*  HCT 34.9*  MCV 89.5  PLT 258   EKG: Normal sinus rhythm, no ST/T wave changes  Theodis Blaze, MD  [facility] Pager [phone]  If 7PM-7AM, please contact night-coverage [email or web address] Password [email or web address] 03/09/2014, 1:56 AM

## 2014-03-09 NOTE — Care Management Note (Addendum)
    Page 1 of 2   03/14/2014     11:12:29 AM CARE MANAGEMENT NOTE 03/14/2014  Patient:  Ricky [name], Ricky [name]   Account Number:  [id number]  Date Initiated:  03/09/2014  Documentation initiated by:  [facility]  Subjective/Objective Assessment:   80 y.o. male with a history of progressive supranuclear palsy with dementia, hypertension, syncope, hypothyroidism presenting to [facility] ED after sustaining an episode of fall at home.//Home with son     Action/Plan:   IV abx.//Access for [facility] needs   Anticipated DC Date:  03/12/2014   Anticipated DC Plan:  [place]  CM consult      [facility] Choice  HOME HEALTH  Resumption Of Svcs/PTA Provider   Choice offered to / List presented to:  C-3 Spouse        HH arranged  HH-1 RN      [facility] agency  [place]   Status of service:  Completed, signed off Medicare Important Message given?   (If response is "NO", the following Medicare IM given date fields will be blank) Date Medicare IM given:   Date Additional Medicare IM given:    Discharge Disposition:  [place]  Per UR Regulation:  Reviewed for med. necessity/level of care/duration of stay  If discussed at [place] of Stay Meetings, dates discussed:   03/14/2014    Comments:  03/10/14 [place], RN, BSN, [employer] [phone] Spoke with pt son, Ricky [name] via telephone regarding discharge planning.Pt currently active with [facility] for RN services.  Resumption of care requested.  [facility] [place] notified.  No DME needs identified at this time.  Ricky [name] is very hands-on with his dad as he does modified PT exercises with him and helps with ADLs.  He and his sister have hired a Retail banker NA to [employer]. Ricky [name] is in contact with "[employer] [facility]" services which can provide any DMEs needed.  Sounds like family is very supportive and fully abrest of pt needs and will reach out when appropriate.

## 2014-03-09 NOTE — Progress Notes (Signed)
OT NOTE   03/09/14 1359  OT Visit Information  Last OT Received On 03/09/14  Assistance Needed +2  History of Present Illness Ricky [name] is a 80 y.o. male with a history of progressive supranuclear palsy with dementia, hypertension, syncope, hypothyroidism presenting with diffuse weakness, multiple falls. Fall occurred prior to arrival.  Patient's son is concerned for progressively worsening weakness, frequent falls, worsening recently. Occurred at home. History obtained from ED notes, as pt is poor historian, answering "yes ma'am" to all questions.    Precautions  Precautions Fall  Home Living  Family/patient expects to be discharged to: Private residence  Living Arrangements Children  Available Help at Discharge Family  Type of [place] entrance  [place] One level  Bathroom Shower/Tub (sponge bath)  Biochemist, clinical (3n1)  Prior Function  Level of [place] Needs assistance  Gait / Transfers Assistance Needed per son- pt walks with RW. Son states he has used w/c 25% of the time since Nov 17 2013  ADL's / Homemaking Assistance Needed called son / daughter to speak regarding PTA - Pt's family reports that his hx of Progressive supranuclear palsy (PSP) makes his ability to participate in ADLS vary daily. Pt does ambulate majority of the time with walker from bed room to kitchen. Pt requires total (A) for peri care. Pt states against washing machine holding the round drum inside and leaning shoulder against wall for support while total (A) for peri care. Son reports "we pick him up and put him in the wheelchair"  Communication / Swallowing Assistance Needed pt's son has fed patient total (A) for the last 45 days.  Communication  Communication HOH  Cognition  Arousal/Alertness Lethargic  Behavior During Therapy Flat affect  Overall Cognitive Status Impaired/Different from baseline  Area of Impairment Attention;Awareness  Orientation Level Disoriented  to;Place;Person;Time;Situation  Current Attention Level (aroused)  Awareness Intellectual  General Comments no response to therapist  Upper Extremity Assessment  Upper Extremity Assessment Difficult to assess due to impaired cognition  Lower Extremity Assessment  Lower Extremity Assessment Difficult to assess due to impaired cognition  Cervical / Trunk Assessment  Cervical / Trunk Assessment Kyphotic  ADL  Overall ADL's  Needs assistance/impaired  General ADL Comments pt difficult to arouse supine in bed with family visitor. OT calling son [name] and daughter [name] over the phone to discuss d/c options and limited participation. Family requesting to come to session and see patient. OT agreeable to second session. Per son Pt is able to sit EOB if you use the right technique. Educated family on possible need for hospital bed and hoyer lift based on limited participation. Family reports "we are not quit there yet"  Vision- History  Baseline Vision/History Macular Degeneration  Vision- Assessment  Vision Assessment? Vision impaired- to be further tested in functional context  Bed Mobility  Overal bed mobility (unable to participate)  OT - End of Session  Activity Tolerance Patient limited by lethargy  Patient left in bed;with family/visitor present  Nurse Communication Mobility status;Need for lift equipment  OT Assessment  OT Recommendation/Assessment Patient needs continued OT Services  OT Problem List Decreased strength;Decreased activity tolerance;Impaired balance (sitting and/or standing);Decreased cognition;Decreased safety awareness;Decreased knowledge of use of DME or AE;Decreased knowledge of precautions;Pain  OT Therapy Diagnosis  Generalized weakness;Cognitive deficits;Acute pain  OT Plan  OT Frequency Min 1X/week  OT Treatment/Interventions Self-care/ADL training;Therapeutic activities;Cognitive remediation/compensation;Patient/family education;Balance training;Therapeutic  exercise  OT Recommendation  Follow Up Recommendations SNF  OT  Equipment 3 in 1 bedside comode;Hospital bed;Other (comment) (hoyer mattress overlay)  Individuals Consulted  Consulted and Agree with Results and Recommendations Family member/caregiver;Patient unable/family or caregiver not available  Family Member Consulted [name] son and lisa daughter  Acute Rehab OT Goals  Patient Stated Goal Pt did not state goals during session. Unable - only responds "Yes Ma'am"  OT Goal Formulation With patient/family  Time For Goal Achievement 03/23/14  Potential to Achieve Goals Poor  OT Time Calculation  OT Start Time 1416  OT Stop Time 1446  OT Time Calculation (min) 30 min  OT General Charges  $OT Visit 1 Procedure  OT Evaluation  $Initial OT Evaluation Tier I 1 Procedure  OT Treatments  $Therapeutic Activity 8-22 mins   Jeri Modena   OTR/L Pager: [phone] Office: [phone] .

## 2014-03-09 NOTE — ED Notes (Signed)
Pt resting.  Continues to deny pain.

## 2014-03-10 DIAGNOSIS — F0280 Dementia in other diseases classified elsewhere without behavioral disturbance: Secondary | ICD-10-CM

## 2014-03-10 DIAGNOSIS — G309 Alzheimer's disease, unspecified: Secondary | ICD-10-CM

## 2014-03-10 DIAGNOSIS — R627 Adult failure to thrive: Secondary | ICD-10-CM

## 2014-03-10 MED ORDER — D-MANNOSE PO POWD: 2000.0000 mg | Freq: Two times a day (BID) | ORAL | Status: DC

## 2014-03-10 MED ORDER — COLLAGENASE 250 UNIT/GM EX OINT
TOPICAL_OINTMENT | Freq: Every day | CUTANEOUS | Status: DC
  Administered 2014-03-10 – 2014-03-13 (×4): via TOPICAL
  Filled 2014-03-10 (×2): qty 30

## 2014-03-10 MED ORDER — ROTIGOTINE 2 MG/24HR TD PT24
1.0000 | MEDICATED_PATCH | Freq: Every day | TRANSDERMAL | Status: DC
  Administered 2014-03-10 – 2014-03-13 (×4): 1 via TRANSDERMAL

## 2014-03-10 NOTE — Progress Notes (Signed)
PHARMACIST - PHYSICIAN ORDER COMMUNICATION  CONCERNING: P&T Medication Policy on Herbal Medications  DESCRIPTION:  This patient's order for:  D-Mannose  has been noted.  This product(s) is classified as an "herbal" or natural product. Due to a lack of definitive safety studies or FDA approval, nonstandard manufacturing practices, plus the potential risk of unknown drug-drug interactions while on inpatient medications, the Pharmacy and Therapeutics Committee does not permit the use of "herbal" or natural products of this type within [facility].   ACTION TAKEN: The pharmacy department is unable to verify this order at this time and your patient has been informed of this safety policy. Please reevaluate patient's clinical condition at discharge and address if the herbal or natural product(s) should be resumed at that time.  Tannia Contino S. Alford Highland, PharmD, [place] Clinical Staff Pharmacist Pager [phone]

## 2014-03-10 NOTE — Progress Notes (Signed)
[facility] PROGRESS NOTE  Ricky [name] OEV:[id number] DOB: 06-07-1917 DOA: 03/08/2014 PCP: Hollace Kinnier, DO  Brief narrative  80 year old male patient with history of Alzheimer's/senile dementia, chronic indwelling Foley catheter, HTN, Parkinsonian syndrome, hypothyroid, gout, progressive supranuclear palsy, lives with his son, presented to the ED after an episode of fall at home. She reported progressive weakness and some interested in SNF placement. UA suggestive of UTI.   Assessment/Plan:  Complicated UTI/cystitis/chronic Foley:  Patient denies dysuria or urinary frequency. No reported fevers or chills. Continue empiric IV Rocephin. urine cx pending.  Fall at home:  Likely secondary to unsteady gait from advanced age, dementia and parkinsonism. PT and OT evaluation recommend SNF. Will d/w son.  Hypertension:  . Continue hydralazine. Stable today.   History of Alzheimer's/senile dementia/ parkinsonism/ progressive supranuclear palsy with FTT:   Continue home medications. PT / OT recommends SNF  Anemia:  Chronic and stable.  L 1 compression fracture: Stable on imaging. Patient denies any pain.  RUL pulmonary nodules on chest x-ray:  Reported as stable. Outpatient followup  when necessary  hypothyroidism:  Continue Synthroid  DVT prophylaxis: sq lovenox  Diet: regular   Code Status: Full  Family Communication: None at bedside . Will call son to update and discuss about disposition Disposition Plan: SNF per PT/OT will d/w son  Consultants:  None   Procedures:  Foley catheter   Antibiotics:  IV Rocephin 4/23 >    Subjective:  No overnight issues. Denies any pain    Objective: Filed Vitals:   03/10/14 0925  BP: 123/63  Pulse: 73  Temp:   Resp: 18    Intake/Output Summary (Last 24 hours) at 03/10/14 0948 Last data filed at 03/10/14 0047  Gross per 24 hour  Intake     60 ml  Output   1226 ml  Net  -1166 ml   Filed Weights   03/08/14  2040 03/09/14 0256 03/10/14 0654  Weight: 74.844 kg (165 lb) 68.2 kg (150 lb 5.7 oz) 63.8 kg (140 lb 10.5 oz)    Exam:   General:  Elderly male in NAD. Demented  HEENT: no pallor, moist mucosa  Chest: clear b/l, no added sounds  CVS: NS1&S2, No MRG  WEX:HBZJ, NT, ND, BS+. Foley in place  Ext: warm, no edema  CNS: AAOX0, demented, responds to commands  Data Reviewed: Basic Metabolic Panel:  Recent Labs Lab 03/08/14 2259 03/09/14 0409  NA 138 138  K 4.4 4.4  CL 100 100  CO2 25 25  GLUCOSE 91 93  BUN 11 9  CREATININE 0.82 0.77  CALCIUM 9.2 9.2   Liver Function Tests:  Recent Labs Lab 03/08/14 2259  AST 20  ALT 13  ALKPHOS 78  BILITOT 0.3  PROT 7.9  ALBUMIN 3.6   No results found for this basename: LIPASE, AMYLASE,  in the last 168 hours No results found for this basename: AMMONIA,  in the last 168 hours CBC:  Recent Labs Lab 03/08/14 2259 03/09/14 0409  WBC 6.5 6.7  NEUTROABS 4.7  --   HGB 11.1* 10.7*  HCT 34.9* 33.3*  MCV 89.5 88.6  PLT 258 266   Cardiac Enzymes: No results found for this basename: CKTOTAL, CKMB, CKMBINDEX, TROPONINI,  in the last 168 hours BNP (last 3 results) No results found for this basename: PROBNP,  in the last 8760 hours CBG: No results found for this basename: GLUCAP,  in the last 168 hours  Recent Results (from the past 240 hour(s))  URINE CULTURE     Status: None   Collection Time    03/08/14 11:00 PM      Result Value Ref Range Status   Specimen Description URINE, CATHETERIZED   Final   Special Requests NONE   Final   Culture  Setup Time     Final   Value: 03/09/2014 00:59     Performed at [place] PENDING   Incomplete   Culture     Final   Value: Culture reincubated for better growth     Performed at [facility]   Report Status PENDING   Incomplete  MRSA PCR SCREENING     Status: None   Collection Time    03/09/14  2:49 AM      Result Value Ref Range Status   MRSA  by PCR NEGATIVE  NEGATIVE Final   Comment:            The GeneXpert MRSA Assay (FDA     approved for NASAL specimens     only), is one component of a     comprehensive MRSA colonization     surveillance program. It is not     intended to diagnose MRSA     infection nor to guide or     monitor treatment for     MRSA infections.     Studies: Dg Chest 1 View  03/08/2014   CLINICAL DATA:  Fall  EXAM: CHEST - 1 VIEW  COMPARISON:  DG THORACIC SPINE dated 03/08/2014; DG CHEST 2 VIEW dated 01/24/2014  FINDINGS: Stable right upper lobe lung nodules. Lungs are under aerated with scattered volume loss. Cardiomegaly. No pneumothorax.  IMPRESSION: Stable right upper lobe nodules.  Low volumes.   Electronically Signed   By: Maryclare Bean M.D.   On: 03/08/2014 22:58   Dg Cervical Spine 2-3 Views  03/08/2014   CLINICAL DATA:  Fall  EXAM: CERVICAL SPINE - 2-3 VIEW  COMPARISON:  None.  FINDINGS: Only a single lateral cervical spine view is submitted. There is reversed cervical lordosis. There is severe narrowing at virtually every level in the cervical spine. The image is rotated. Detail is obscured by positioning. Prevertebral soft tissues are borderline prominent. This may be due to positioning.  IMPRESSION: Limited exam as described. Injury cannot be excluded by this study. Advanced degenerative changes. Prevertebral soft tissues are borderline prominent.   Electronically Signed   By: Maryclare Bean M.D.   On: 03/08/2014 23:00   Dg Thoracic Spine 2 View  03/08/2014   CLINICAL DATA:  Fall  EXAM: THORACIC SPINE - 2 VIEW  COMPARISON:  DG CHEST 2 VIEW dated 01/24/2014  FINDINGS: Stable L1 compression deformity. Osteopenia. Kyphosis. No definite new compression deformities.  IMPRESSION: Stable L1 compression fracture.   Electronically Signed   By: Maryclare Bean M.D.   On: 03/08/2014 22:56   Dg Lumbar Spine Complete  03/08/2014   CLINICAL DATA:  Fall  EXAM: LUMBAR SPINE - COMPLETE 4+ VIEW  COMPARISON:  DG LUMBAR SPINE 2-3 VIEWS  dated 01/15/2014  FINDINGS: Stable L1 compression fracture. Osteopenia. No new compression deformities. Degenerative changes are noted.  IMPRESSION: No acute bony pathology.  Stable L1 compression fracture.   Electronically Signed   By: Maryclare Bean M.D.   On: 03/08/2014 22:57   Dg Elbow 2 Views Right  03/08/2014   CLINICAL DATA:  Fall  EXAM: RIGHT ELBOW - 2 VIEW  COMPARISON:  None.  FINDINGS: No  acute fracture.  No dislocation.  IMPRESSION: No acute bony pathology.   Electronically Signed   By: Maryclare Bean M.D.   On: 03/08/2014 22:56    Scheduled Meds: . acetaminophen  1,000 mg Oral 3 times per day  . aspirin EC  325 mg Oral Daily  . atorvastatin  10 mg Oral q1800  . calcium-vitamin D  1 tablet Oral BID WC  . cefTRIAXone (ROCEPHIN)  IV  1 g Intravenous Q24H  . enoxaparin (LOVENOX) injection  40 mg Subcutaneous Daily  . ferrous sulfate  325 mg Oral QODAY  . hydrALAZINE  12.5 mg Oral BID  . lactose free nutrition  90 mL Oral BID BM  . levothyroxine  25 mcg Oral QAC breakfast  . memantine  10 mg Oral BID  . multivitamin with minerals  1 tablet Oral Daily  . polyethylene glycol  17 g Oral Daily  . rotigotine  1 patch Transdermal Daily  . sodium chloride  3 mL Intravenous Q12H  . sodium chloride  3 mL Intravenous Q12H  . vitamin B-12  5,000 mcg Oral Daily   Continuous Infusions:     Time spent: 25 minutes    Rufus Cypert  [facility] Pager [phone] If 7PM-7AM, please contact night-coverage at [email or web address], password [facility] 03/10/2014, 9:48 AM  LOS: 2 days

## 2014-03-10 NOTE — Consult Note (Addendum)
WOC wound consult note Reason for Consult:Consult requested for sacrum wound.  Pt fell at home prior to admission and this may be consistent with the etiology of the wound.  Pt is unsure of how long the wound has been present and no family members are at bedside. Pt very emeciated with multiple systemic factors which might impair healing. Wound type: Unstageable Pressure Ulcer POA: Yes Measurement: 3X2.5cm Wound bed: 100% yellow slough Drainage (amount, consistency, odor) Small amt yellow drainage, no odor. Periwound: Intact skin surrounding Dressing procedure/placement/frequency: Santyl ointment to chemically debride nonviable tissue. Please re-consult if further assistance is needed.  Thank-you,  Julien Girt MSN, [place], [place], [place], [place]

## 2014-03-11 ENCOUNTER — Encounter (HOSPITAL_COMMUNITY): Payer: Self-pay | Admitting: *Deleted

## 2014-03-11 NOTE — Progress Notes (Signed)
[facility] PROGRESS NOTE  Ricky [name] AYT:[id number] DOB: May 11, 1917 DOA: 03/08/2014 PCP: Hollace Kinnier, DO  Brief narrative  80 year old male patient with history of Alzheimer's/senile dementia, chronic indwelling Foley catheter, HTN, Parkinsonian syndrome, hypothyroid, gout, progressive supranuclear palsy, lives with his son, presented to the ED after an episode of fall at home. She reported progressive weakness and some interested in SNF placement. UA suggestive of UTI.   Assessment/Plan:  Complicated UTI/cystitis/chronic Foley:  Patient denies dysuria or urinary frequency. No reported fevers or chills. Continue empiric IV Rocephin. urine cx growing GNR.  Fall at home:  Likely secondary to unsteady gait from advanced age, dementia and parkinsonism. PT and OT evaluation recommend SNF. Will d/w son.  Hypertension:  . Continue hydralazine. Stable   History of Alzheimer's/senile dementia/ parkinsonism/ progressive supranuclear palsy with FTT:   Continue home medications. PT / OT recommends SNF  Anemia:  Chronic and stable.  L 1 compression fracture:  Stable on imaging. Patient denies any pain.  RUL pulmonary nodules on chest x-ray:  Reported as stable. Outpatient followup  when necessary  hypothyroidism:  Continue Synthroid  DVT prophylaxis: sq lovenox  Diet: regular   Code Status: Full  Family Communication: None at bedside .called son and  Left a voice msg on 4/24 Disposition Plan: SNF per PT/OT will d/w son.  Consultants:  None   Procedures:  Foley catheter   Antibiotics:  IV Rocephin 4/23 >    Subjective:  No overnight issues.     Objective: Filed Vitals:   03/11/14 1424  BP: 139/77  Pulse: 67  Temp: 98 F (36.7 C)  Resp: 20    Intake/Output Summary (Last 24 hours) at 03/11/14 1527 Last data filed at 03/11/14 1423  Gross per 24 hour  Intake    720 ml  Output      0 ml  Net    720 ml   Filed Weights   03/09/14 0256 03/10/14  0654 03/11/14 0628  Weight: 68.2 kg (150 lb 5.7 oz) 63.8 kg (140 lb 10.5 oz) 67.4 kg (148 lb 9.4 oz)    Exam:   General:  Elderly male in NAD. Demented  HEENT: no pallor, moist mucosa  Chest: clear b/l, no added sounds  CVS: NS1&S2, No MRG  TFT:DDUK, NT, ND, BS+. Foley in place  Ext: warm, no edema  CNS: AAOX0, demented, responds to simple commands  Data Reviewed: Basic Metabolic Panel:  Recent Labs Lab 03/08/14 2259 03/09/14 0409  NA 138 138  K 4.4 4.4  CL 100 100  CO2 25 25  GLUCOSE 91 93  BUN 11 9  CREATININE 0.82 0.77  CALCIUM 9.2 9.2   Liver Function Tests:  Recent Labs Lab 03/08/14 2259  AST 20  ALT 13  ALKPHOS 78  BILITOT 0.3  PROT 7.9  ALBUMIN 3.6   No results found for this basename: LIPASE, AMYLASE,  in the last 168 hours No results found for this basename: AMMONIA,  in the last 168 hours CBC:  Recent Labs Lab 03/08/14 2259 03/09/14 0409  WBC 6.5 6.7  NEUTROABS 4.7  --   HGB 11.1* 10.7*  HCT 34.9* 33.3*  MCV 89.5 88.6  PLT 258 266   Cardiac Enzymes: No results found for this basename: CKTOTAL, CKMB, CKMBINDEX, TROPONINI,  in the last 168 hours BNP (last 3 results) No results found for this basename: PROBNP,  in the last 8760 hours CBG: No results found for this basename: GLUCAP,  in the last 168 hours  Recent Results (from the past 240 hour(s))  URINE CULTURE     Status: None   Collection Time    03/08/14 11:00 PM      Result Value Ref Range Status   Specimen Description URINE, CATHETERIZED   Final   Special Requests NONE   Final   Culture  Setup Time     Final   Value: 03/09/2014 00:59     Performed at [place]     Final   Value: >=100,000 COLONIES/ML     Performed at [facility]   Culture     Final   Value: [place]     Performed at [facility]   Report Status PENDING   Incomplete  MRSA PCR SCREENING     Status: None   Collection Time    03/09/14  2:49 AM       Result Value Ref Range Status   MRSA by PCR NEGATIVE  NEGATIVE Final   Comment:            The GeneXpert MRSA Assay (FDA     approved for NASAL specimens     only), is one component of a     comprehensive MRSA colonization     surveillance program. It is not     intended to diagnose MRSA     infection nor to guide or     monitor treatment for     MRSA infections.     Studies: No results found.  Scheduled Meds: . acetaminophen  1,000 mg Oral 3 times per day  . aspirin EC  325 mg Oral Daily  . atorvastatin  10 mg Oral q1800  . calcium-vitamin D  1 tablet Oral BID WC  . cefTRIAXone (ROCEPHIN)  IV  1 g Intravenous Q24H  . collagenase   Topical Daily  . enoxaparin (LOVENOX) injection  40 mg Subcutaneous Daily  . ferrous sulfate  325 mg Oral QODAY  . hydrALAZINE  12.5 mg Oral BID  . lactose free nutrition  90 mL Oral BID BM  . levothyroxine  25 mcg Oral QAC breakfast  . memantine  10 mg Oral BID  . multivitamin with minerals  1 tablet Oral Daily  . polyethylene glycol  17 g Oral Daily  . rotigotine  1 patch Transdermal Daily  . sodium chloride  3 mL Intravenous Q12H  . sodium chloride  3 mL Intravenous Q12H  . vitamin B-12  5,000 mcg Oral Daily   Continuous Infusions:     Time spent: 25 minutes    [name] [name]  [facility] Pager [phone] If 7PM-7AM, please contact night-coverage at [email or web address], password [facility] 03/11/2014, 3:27 PM  LOS: 3 days

## 2014-03-11 NOTE — ED Provider Notes (Signed)
I saw and evaluated the patient, reviewed the resident's note and I agree with the findings and plan.   EKG Interpretation   Date/Time:  Wednesday March 08 2014 23:19:54 EDT Ventricular Rate:  71 PR Interval:    QRS Duration: 133 QT Interval:  443 QTC Calculation: 481 R Axis:   -20 Text Interpretation:  Accelerated junctional rhythm Right bundle branch  block Artifact in lead(s) I II III aVR aVL V1 V2 No significant change was  found Confirmed by Aria Pickrell  MD, Cashlyn Huguley ([id number]) on 03/09/2014 12:36:22 AM      Increasing generalized weakness. No indication for head CT  Hoy Morn, MD 03/11/14 [phone]

## 2014-03-12 DIAGNOSIS — W19XXXA Unspecified fall, initial encounter: Secondary | ICD-10-CM

## 2014-03-12 DIAGNOSIS — N390 Urinary tract infection, site not specified: Secondary | ICD-10-CM | POA: Diagnosis present

## 2014-03-12 MED ORDER — CIPROFLOXACIN HCL 500 MG PO TABS: 500.0000 mg | ORAL_TABLET | Freq: Two times a day (BID) | ORAL | Status: DC

## 2014-03-12 NOTE — Significant Event (Signed)
Dr Clementeen Graham called informed patent's family ( son [name]) wants to talk to him before they take the patient home . MD on his way to talk to family .Family informed Dr Clementeen Graham on his way.

## 2014-03-12 NOTE — Discharge Summary (Addendum)
Physician Discharge Summary  EWEL LONA FXT:[id number] DOB: 1917/11/02 DOA: 03/08/2014  PCP: Hollace Kinnier, DO  Admit date: 03/08/2014 Discharge date: 03/13/2014  Time spent: 40  minutes  Recommendations for Outpatient Follow-up:  Discharge home with [facility] nad PT Follow up with PCP in 1 week.  Discharge Diagnoses:  Principal Problem:   UTI (lower urinary tract infection), due to proteus and pseudomonas   Active Problems:   HYPOTHYROIDISM   Alzheimer disease   Hypertension   FTT (failure to thrive) in adult   Dysphagia   CKD (chronic kidney disease), stage II   Weakness   Fall   Discharge Condition: fair  Diet recommendation:  regular  Filed Weights   03/10/14 0654 03/11/14 0628 03/12/14 0520  Weight: 63.8 kg (140 lb 10.5 oz) 67.4 kg (148 lb 9.4 oz) 70.1 kg (154 lb 8.7 oz)    History of present illness:  Please refer to admission H&P for details, but in brief, 80 year old male patient with history of Alzheimer's/senile dementia, chronic indwelling Foley catheter, HTN, Parkinsonian syndrome, hypothyroid, gout, progressive supranuclear palsy, lives with his son, presented to the ED after an episode of fall at home. reported progressive weakness.UA suggestive of UTI.    Hospital Course:  Complicated UTI/cystitis/chronic Foley:  Patient denies dysuria or urinary frequency. No reported fevers or chills. Hx fo UTIs in past and on chronic foley. . Continue empiric IV Rocephin. urine cx growing proteus and pseudomonas both sensitive to ciprofloxacin. He has not  been afebrile while he is here and hemodynamically stable. i will discharge him on oral ciprofloxacin for total of 10 day course ( until 03/17/2014)  Fall at home:  Likely secondary to unsteady gait from advanced age, dementia and parkinsonism. PT and OT evaluation recommend SNF. Daughter [name] who is thw health care proxy refuses for SNF and wants him to go home. Will arrange HH.   Hypertension:  . Continue  hydralazine. Stable   History of Alzheimer's/senile dementia/ parkinsonism/ progressive supranuclear palsy with FTT:  Continue home medications.  Anemia:  Chronic and stable. continue iron supplements  L1 compression fracture:  Stable on imaging. Patient denies any pain.   RUL pulmonary nodules on chest x-ray:  Reported as stable. Outpatient followup when necessary   hypothyroidism:  Continue Synthroid   Hx of CVA  continue ASA and statin  BPH  has chronic foley    Diet: regular   Code Status: Full   Family Communication: spoke with daughter [name] over the phone today.  Disposition Plan: home with [facility] ( daughter refused SNF)  Consultants:  None Procedures:  None  Antibiotics:  IV Rocephin 4/23 >      Discharge Exam: Filed Vitals:   03/12/14 0520  BP: 154/86  Pulse: 66  Temp: 98.6 F (37 C)  Resp: 17   General: Elderly male in NAD. Demented  HEENT: no pallor, moist mucosa  Chest: clear b/l, no added sounds  CVS: NS1&S2, No MRG  GDJ:MEQA, NT, ND, BS+. Foley in place  Ext: warm, no edema  CNS: AAOX0, demented, responds to simple commands    Discharge Instructions You were cared for by a hospitalist during your hospital stay. If you have any questions about your discharge medications or the care you received while you were in the hospital after you are discharged, you can call the unit and asked to speak with the hospitalist on call if the hospitalist that took care of you is not available. Once you are discharged, your primary care physician  will handle any further medical issues. Please note that NO REFILLS for any discharge medications will be authorized once you are discharged, as it is imperative that you return to your primary care physician (or establish a relationship with a primary care physician if you do not have one) for your aftercare needs so that they can reassess your need for medications and monitor your lab values.   Future Appointments  Provider Department Dept Phone   04/03/2014 8:30 AM Fay Records, MD [facility] [phone]   05/26/2014 11:30 AM Star Age, MD [facility] [phone]       Medication List         acetaminophen 500 MG tablet  Commonly known as:  TYLENOL  Take 1,000 mg by mouth 3 (three) times daily.     aspirin 325 MG EC tablet  Take 1 tablet (325 mg total) by mouth daily.     atorvastatin 10 MG tablet  Commonly known as:  LIPITOR  Take 1 tablet (10 mg total) by mouth daily at 6 PM.     bacitracin ointment  Apply 1 application topically 2 (two) times daily. Applied to tip of penis for skin tear.     CALTRATE 600+D PLUS MINERALS 600-800 MG-UNIT Chew  Chew 1 tablet by mouth 2 (two) times daily with a meal.     ciprofloxacin 500 MG tablet  Commonly known as:  CIPRO  Take 1 tablet (500 mg total) by mouth 2 (two) times daily. Until 03/17/2014     collagenase ointment  Commonly known as:  SANTYL  Apply 1 application topically daily.     CRANBERRY PO  Take 4,200 mg by mouth 3 (three) times daily.     D-Mannose Powd  Take 2,000 mg by mouth 2 (two) times daily.     ferrous sulfate 325 (65 FE) MG tablet  Take 325 mg by mouth every other day.     hydrALAZINE 25 MG tablet  Commonly known as:  APRESOLINE  Take 0.5 tablets (12.5 mg total) by mouth 2 (two) times daily.     lactose free nutrition Liqd  Take 90 mLs by mouth 2 (two) times daily between meals.     levothyroxine 25 MCG tablet  Commonly known as:  SYNTHROID, LEVOTHROID  Take 25 mcg by mouth daily before breakfast.     lidocaine 5 %  Commonly known as:  LIDODERM  Place 2 patches onto the skin daily as needed (for back pain). Remove & Discard patch within 12 hours or as directed by MD     MELATONIN EXTRA STRENGTH PO  Take 15 mLs by mouth at bedtime as needed (sleep).     memantine 10 MG tablet  Commonly known as:  NAMENDA  Take 10 mg by mouth 2 (two) times daily.     multivitamin  with minerals Tabs tablet  Take 1 tablet by mouth daily.     NEUPRO 2 MG/24HR  Generic drug:  rotigotine  Place 1 patch onto the skin daily.     polyethylene glycol packet  Commonly known as:  MIRALAX / GLYCOLAX  Take 17 g by mouth daily.     Vitamin B-12 2500 MCG Subl  Place 5,000 mcg under the tongue daily.       Allergies  Allergen Reactions  . Other Other (See Comments)    Dairy products- cause swallowing difficulty        Follow-up Information   Follow up with [facility]. (RN)    Specialty:  Home Health Services   Contact information:   [address] [place] [address] [phone]       Follow up with REED, TIFFANY, DO. Schedule an appointment as soon as possible for a visit in 1 week.   Specialty:  Geriatric Medicine   Contact information:   [address] [place] [address] [phone]        The results of significant diagnostics from this hospitalization (including imaging, microbiology, ancillary and laboratory) are listed below for reference.    Significant Diagnostic Studies: Dg Chest 1 View  03/08/2014   CLINICAL DATA:  Fall  EXAM: CHEST - 1 VIEW  COMPARISON:  DG THORACIC SPINE dated 03/08/2014; DG CHEST 2 VIEW dated 01/24/2014  FINDINGS: Stable right upper lobe lung nodules. Lungs are under aerated with scattered volume loss. Cardiomegaly. No pneumothorax.  IMPRESSION: Stable right upper lobe nodules.  Low volumes.   Electronically Signed   By: Maryclare Bean M.D.   On: 03/08/2014 22:58   Dg Cervical Spine 2-3 Views  03/08/2014   CLINICAL DATA:  Fall  EXAM: CERVICAL SPINE - 2-3 VIEW  COMPARISON:  None.  FINDINGS: Only a single lateral cervical spine view is submitted. There is reversed cervical lordosis. There is severe narrowing at virtually every level in the cervical spine. The image is rotated. Detail is obscured by positioning. Prevertebral soft tissues are borderline prominent. This may be due to positioning.   IMPRESSION: Limited exam as described. Injury cannot be excluded by this study. Advanced degenerative changes. Prevertebral soft tissues are borderline prominent.   Electronically Signed   By: Maryclare Bean M.D.   On: 03/08/2014 23:00   Dg Thoracic Spine 2 View  03/08/2014   CLINICAL DATA:  Fall  EXAM: THORACIC SPINE - 2 VIEW  COMPARISON:  DG CHEST 2 VIEW dated 01/24/2014  FINDINGS: Stable L1 compression deformity. Osteopenia. Kyphosis. No definite new compression deformities.  IMPRESSION: Stable L1 compression fracture.   Electronically Signed   By: Maryclare Bean M.D.   On: 03/08/2014 22:56   Dg Lumbar Spine Complete  03/08/2014   CLINICAL DATA:  Fall  EXAM: LUMBAR SPINE - COMPLETE 4+ VIEW  COMPARISON:  DG LUMBAR SPINE 2-3 VIEWS dated 01/15/2014  FINDINGS: Stable L1 compression fracture. Osteopenia. No new compression deformities. Degenerative changes are noted.  IMPRESSION: No acute bony pathology.  Stable L1 compression fracture.   Electronically Signed   By: Maryclare Bean M.D.   On: 03/08/2014 22:57   Dg Elbow 2 Views Right  03/08/2014   CLINICAL DATA:  Fall  EXAM: RIGHT ELBOW - 2 VIEW  COMPARISON:  None.  FINDINGS: No acute fracture.  No dislocation.  IMPRESSION: No acute bony pathology.   Electronically Signed   By: Maryclare Bean M.D.   On: 03/08/2014 22:56    Microbiology: Recent Results (from the past 240 hour(s))  URINE CULTURE     Status: None   Collection Time    03/08/14 11:00 PM      Result Value Ref Range Status   Specimen Description URINE, CATHETERIZED   Final   Special Requests NONE   Final   Culture  Setup Time     Final   Value: 03/09/2014 00:59     Performed at [facility]     Final   Value: >=100,000 COLONIES/ML     Performed at [facility]  Final   Value: GRAM NEGATIVE RODS     Performed at [facility]   Report Status PENDING   Incomplete  MRSA PCR SCREENING     Status: None   Collection Time    03/09/14  2:49 AM      Result  Value Ref Range Status   MRSA by PCR NEGATIVE  NEGATIVE Final   Comment:            The GeneXpert MRSA Assay (FDA     approved for NASAL specimens     only), is one component of a     comprehensive MRSA colonization     surveillance program. It is not     intended to diagnose MRSA     infection nor to guide or     monitor treatment for     MRSA infections.     Labs: Basic Metabolic Panel:  Recent Labs Lab 03/08/14 2259 03/09/14 0409  NA 138 138  K 4.4 4.4  CL 100 100  CO2 25 25  GLUCOSE 91 93  BUN 11 9  CREATININE 0.82 0.77  CALCIUM 9.2 9.2   Liver Function Tests:  Recent Labs Lab 03/08/14 2259  AST 20  ALT 13  ALKPHOS 78  BILITOT 0.3  PROT 7.9  ALBUMIN 3.6   No results found for this basename: LIPASE, AMYLASE,  in the last 168 hours No results found for this basename: AMMONIA,  in the last 168 hours CBC:  Recent Labs Lab 03/08/14 2259 03/09/14 0409  WBC 6.5 6.7  NEUTROABS 4.7  --   HGB 11.1* 10.7*  HCT 34.9* 33.3*  MCV 89.5 88.6  PLT 258 266   Cardiac Enzymes: No results found for this basename: CKTOTAL, CKMB, CKMBINDEX, TROPONINI,  in the last 168 hours BNP: BNP (last 3 results) No results found for this basename: PROBNP,  in the last 8760 hours CBG: No results found for this basename: GLUCAP,  in the last 168 hours     Signed:  Jamilyn Pigeon  [facility] 03/12/2014, 10:28 AM

## 2014-03-13 LAB — CBC
HCT: 34.7 % — ABNORMAL LOW (ref 39.0–52.0)
HEMOGLOBIN: 11.3 g/dL — AB (ref 13.0–17.0)
MCH: 28.9 pg (ref 26.0–34.0)
MCHC: 32.6 g/dL (ref 30.0–36.0)
MCV: 88.7 fL (ref 78.0–100.0)
Platelets: 223 10*3/uL (ref 150–400)
RBC: 3.91 MIL/uL — AB (ref 4.22–5.81)
RDW: 14.5 % (ref 11.5–15.5)
WBC: 5.4 10*3/uL (ref 4.0–10.5)

## 2014-03-13 LAB — URINE CULTURE: Colony Count: >=100000

## 2014-03-13 LAB — BASIC METABOLIC PANEL
BUN: 15 mg/dL (ref 6–23)
CO2: 23 meq/L (ref 19–32)
Calcium: 9 mg/dL (ref 8.4–10.5)
Chloride: 100 mEq/L (ref 96–112)
Creatinine, Ser: 0.74 mg/dL (ref 0.50–1.35)
GFR calc Af Amer: 89 mL/min — ABNORMAL LOW (ref >90)
GFR calc non Af Amer: 77 mL/min — ABNORMAL LOW (ref >90)
GLUCOSE: 89 mg/dL (ref 70–99)
POTASSIUM: 4 meq/L (ref 3.7–5.3)
SODIUM: 135 meq/L — AB (ref 137–147)

## 2014-03-13 NOTE — Progress Notes (Signed)
DC IV, DC Home, Patient Non-Tele. Discharge instructions and home medications discussed with patient's daughter. Patient's daughter denied any questions or concerns at this time. Patient leaving unit via wheelchair and appears in no acute distress.

## 2014-03-13 NOTE — Progress Notes (Signed)
CSW met with patient, daughter and one of his sons today to discuss PT's recommendation for SNF. Family was very pleasant and polite but stated that they had already discussed with RNCM and that they are not agreeable to SNF placement. Will take patient home.  Daughter is adamant that they will manage well at home and denies need for any home health PT. She states that he father's routine never varies- he is normally self sufficient of his ADLS' and he will get up each morning, dresses and drives to [facility] where he meets up with friends who sit and eat/talk throughout the day- then they move to another resturant for the rest of the day- visiting and talking. Patient then comes home and prepares for bed.  CSW discussed that this routine may be limited due to recent hospitalization- however daughter and son feel that SNF placement is not indicated and they can provide support as needed at home.  Patient is alert and oriented- he defers to his son and daughter for d/c plan.  CSW explained benefits of Home Health but daughter defers this service as well. CSW notified RNCM and will sign off.  Lorie Phenix. [place], [place]

## 2014-03-13 NOTE — Progress Notes (Signed)
Physical Therapy Treatment Patient Details Name: Ricky [name] MRN: [id number] DOB: 1916-12-29 Today's Date: 03/13/2014    History of Present Illness Ricky [name] is a 80 y.o. male with a history of progressive supranuclear palsy with dementia, hypertension, syncope, hypothyroidism presenting with diffuse weakness, multiple falls. Fall occurred prior to arrival.  Patient's son is concerned for progressively worsening weakness, frequent falls, worsening recently. Occurred at home. History obtained from ED notes, as pt is poor historian, answering "yes ma'am" to all questions.      PT Comments    Pt making slow progress towards physical therapy goals. Mobility limited by extension of knees, hips and trunk during transfers or attempts at therapeutic exercise. Able to flex knees briefly to assist with scooting HOB, however unable to isolate that movement for exercise. Therapist attempted PROM but pt actively resisting into extension. Continue to recommend continued post-acute rehab at the SNF level, however family wanting pt to return home. Will require the equipment listed below. Will continue to follow per POC.  Follow Up Recommendations  SNF;Supervision/Assistance - 24 hour     Equipment Recommendations  Rolling walker with 5" wheels, hoyer lift, hospital bed, aide    Recommendations for Other Services       Precautions / Restrictions Precautions Precautions: Fall Restrictions Weight Bearing Restrictions: No    Mobility  Bed Mobility Overal bed mobility: Needs Assistance;+2 for physical assistance Bed Mobility: Supine to Sit;Sit to Supine     Supine to sit: Total assist;+2 for physical assistance;HOB elevated Sit to supine: Total assist;+2 for physical assistance   General bed mobility comments: Pt continues to be unable to follow commands or initiate movement. Hand-over-hand assist to reach for bed rails but pt resisting.   Transfers Overall transfer level: Needs  assistance Equipment used: Rolling walker (2 wheeled) Transfers: Sit to/from Stand Sit to Stand: Max assist;+2 physical assistance;+2 safety/equipment;From elevated surface         General transfer comment: Max assist with bed pad to stand EOB with walker. Required blocking of feet to prevent sliding and support onto walker as pt lifting off of ground. Very stiff with standing, extending hips and shoulders back to attempt. Even more stiff with stand>sit as pt had difficulty flexing trunk and knees to initiate sit. Verbal and tactile cueing were ineffective in increasing flexion.  Ambulation/Gait                 Stairs            Wheelchair Mobility    Modified Rankin (Stroke Patients Only)       Balance Overall balance assessment: Needs assistance Sitting-balance support: Feet supported;No upper extremity supported Sitting balance-Leahy Scale: Poor Sitting balance - Comments: Able to hold independently for short time (~15 seconds). Pt unable to raise head and demonstrated significant forward head and rounded shoulder postures.    Standing balance support: Bilateral upper extremity supported Standing balance-Leahy Scale: Poor Standing balance comment: Significant posterior lean and unable to anteriorly wight shift. Assist required for lateral weight shifting as well.                     Cognition Arousal/Alertness: Lethargic Behavior During Therapy: Flat affect Overall Cognitive Status: No family/caregiver present to determine baseline cognitive functioning Area of Impairment: Orientation;Attention;Awareness Orientation Level: Disoriented to;Place;Time;Situation Current Attention Level: Focused       Awareness: Intellectual        Exercises General Exercises - Lower Extremity Hip Flexion/Marching: 5 reps;Both;Standing (Lifting  heels off floor only.)    General Comments        Pertinent Vitals/Pain Pt on RA throughout session and did not  demonstrate any SOB with mobility.     Home Living                      Prior Function            PT Goals (current goals can now be found in the care plan section) Acute Rehab PT Goals Patient Stated Goal: Unable to state PT Goal Formulation: With patient Time For Goal Achievement: 03/23/14 Potential to Achieve Goals: Fair Progress towards PT goals: Progressing toward goals    Frequency  Min 2X/week    PT Plan Current plan remains appropriate    Co-evaluation             End of Session Equipment Utilized During Treatment: Gait belt Activity Tolerance: Patient limited by lethargy Patient left: in bed;with call bell/phone within reach     Time: 1328-1350 PT Time Calculation (min): 22 min  Charges:  $Therapeutic Activity: 8-22 mins                    G Codes:      Jolyn Lent 2014-04-02, 3:05 PM  Jolyn Lent, PT, DPT Acute Rehabilitation Services Pager: [phone]

## 2014-03-13 NOTE — Progress Notes (Signed)
[facility] PROGRESS NOTE  Ricky [name] OZD:[id number] DOB: 1917-04-30 DOA: 03/08/2014 PCP: Hollace Kinnier, DO  Assessment/Plan: Complicated UTI/cystitis/chronic Foley:  Patient denies dysuria or urinary frequency. No reported fevers or chills. Hx fo UTIs in past and on chronic foley. . Continue empiric IV Rocephin. urine cx growing proteus and pseudomonas both sensitive to ciproflozxacin. He has not been afebrile while he is here and hemodynamically stable. i will discharge him on oral ciprofloxacin for total of 10 day course ( until 03/17/2014)   Fall at home:  Likely secondary to unsteady gait from advanced age, dementia and parkinsonism. PT and OT evaluation recommend SNF, participating with PT slowly.. Daughter [name] who is the  health care proxy refuses for SNF and wants him to go home. Will arrange HH.   Hypertension:  . Continue hydralazine. Stable   History of Alzheimer's/senile dementia/ parkinsonism/ progressive supranuclear palsy with FTT:  Continue home medications.   Anemia:  Chronic and stable. continue iron supplements   L 1 compression fracture:  Stable on imaging. Patient denies any pain.   RUL pulmonary nodules on chest x-ray:  Reported as stable. Outpatient followup when necessary   hypothyroidism:  Continue Synthroid   Hx of CVA  continue ASA and statin   BPH  has chronic foley      HPI/Subjective: No overnight issues  Objective: Filed Vitals:   03/13/14 1501  BP: 159/89  Pulse: 73  Temp: 98.9 F (37.2 C)  Resp: 18    Intake/Output Summary (Last 24 hours) at 03/13/14 1527 Last data filed at 03/13/14 1500  Gross per 24 hour  Intake    840 ml  Output   1500 ml  Net   -660 ml   Filed Weights   03/11/14 0628 03/12/14 0520 03/13/14 0518  Weight: 67.4 kg (148 lb 9.4 oz) 70.1 kg (154 lb 8.7 oz) 66.2 kg (145 lb 15.1 oz)    Exam:  General: Elderly male in NAD. Demented  HEENT: no pallor, moist mucosa  Chest: clear b/l, no added  sounds  CVS: NS1&S2, No MRG  QVZ:DGLO, NT, ND, BS+. Foley in place  Ext: warm, no edema  CNS: AAOX1, demented, responds to simple commands  Data Reviewed: Basic Metabolic Panel:  Recent Labs Lab 03/08/14 2259 03/09/14 0409 03/13/14 0534  NA 138 138 135*  K 4.4 4.4 4.0  CL 100 100 100  CO2 25 25 23   GLUCOSE 91 93 89  BUN 11 9 15   CREATININE 0.82 0.77 0.74  CALCIUM 9.2 9.2 9.0   Liver Function Tests:  Recent Labs Lab 03/08/14 2259  AST 20  ALT 13  ALKPHOS 78  BILITOT 0.3  PROT 7.9  ALBUMIN 3.6   No results found for this basename: LIPASE, AMYLASE,  in the last 168 hours No results found for this basename: AMMONIA,  in the last 168 hours CBC:  Recent Labs Lab 03/08/14 2259 03/09/14 0409 03/13/14 0534  WBC 6.5 6.7 5.4  NEUTROABS 4.7  --   --   HGB 11.1* 10.7* 11.3*  HCT 34.9* 33.3* 34.7*  MCV 89.5 88.6 88.7  PLT 258 266 223   Cardiac Enzymes: No results found for this basename: CKTOTAL, CKMB, CKMBINDEX, TROPONINI,  in the last 168 hours BNP (last 3 results) No results found for this basename: PROBNP,  in the last 8760 hours CBG: No results found for this basename: GLUCAP,  in the last 168 hours  Recent Results (from the past 240 hour(s))  URINE CULTURE  Status: None   Collection Time    03/08/14 11:00 PM      Result Value Ref Range Status   Specimen Description URINE, CATHETERIZED   Final   Special Requests NONE   Final   Culture  Setup Time     Final   Value: 03/09/2014 00:59     Performed at [place] [facility]     Final   Value: >=100,000 COLONIES/ML     Performed at [facility]   Culture     Final   Value: PROTEUS MIRABILIS     PSEUDOMONAS AERUGINOSA     Performed at [facility]   Report Status 03/13/2014 FINAL   Final   Organism ID, Bacteria PROTEUS MIRABILIS   Final   Organism ID, Bacteria PSEUDOMONAS AERUGINOSA   Final  MRSA PCR SCREENING     Status: None   Collection Time    03/09/14  2:49 AM       Result Value Ref Range Status   MRSA by PCR NEGATIVE  NEGATIVE Final   Comment:            The GeneXpert MRSA Assay (FDA     approved for NASAL specimens     only), is one component of a     comprehensive MRSA colonization     surveillance program. It is not     intended to diagnose MRSA     infection nor to guide or     monitor treatment for     MRSA infections.     Studies: No results found.  Scheduled Meds: . acetaminophen  1,000 mg Oral 3 times per day  . aspirin EC  325 mg Oral Daily  . atorvastatin  10 mg Oral q1800  . calcium-vitamin D  1 tablet Oral BID WC  . cefTRIAXone (ROCEPHIN)  IV  1 g Intravenous Q24H  . collagenase   Topical Daily  . enoxaparin (LOVENOX) injection  40 mg Subcutaneous Daily  . ferrous sulfate  325 mg Oral QODAY  . hydrALAZINE  12.5 mg Oral BID  . lactose free nutrition  90 mL Oral BID BM  . levothyroxine  25 mcg Oral QAC breakfast  . memantine  10 mg Oral BID  . multivitamin with minerals  1 tablet Oral Daily  . polyethylene glycol  17 g Oral Daily  . rotigotine  1 patch Transdermal Daily  . sodium chloride  3 mL Intravenous Q12H  . sodium chloride  3 mL Intravenous Q12H  . vitamin B-12  5,000 mcg Oral Daily   Continuous Infusions:     Time spent: 25 MINUTES    Stephanie Mcglone  [facility] Pager [phone]. If 7PM-7AM, please contact night-coverage at [email or web address], password [facility] 03/13/2014, 3:27 PM  LOS: 5 days

## 2014-03-17 ENCOUNTER — Telehealth: Payer: Self-pay | Admitting: *Deleted

## 2014-03-17 NOTE — Telephone Encounter (Signed)
Called to get results from labs done at the hospital, informed that Dr. Mariea Clonts would look at them on Monday and we will bet back with him. Patient was given a antibiotic (Rocephin), just wanted to be sure that he had been given the correct antibiotic.

## 2014-03-20 ENCOUNTER — Emergency Department (HOSPITAL_COMMUNITY): Payer: Medicare HMO

## 2014-03-20 ENCOUNTER — Encounter (HOSPITAL_COMMUNITY): Payer: Self-pay | Admitting: Emergency Medicine

## 2014-03-20 ENCOUNTER — Inpatient Hospital Stay (HOSPITAL_COMMUNITY)
Admission: EM | Admit: 2014-03-20 | Discharge: 2014-03-28 | DRG: 871 | Disposition: A | Discharge status: Skilled Nursing Facility | Payer: Medicare HMO | Attending: Internal Medicine | Admitting: Internal Medicine

## 2014-03-20 DIAGNOSIS — I10 Essential (primary) hypertension: Secondary | ICD-10-CM | POA: Diagnosis present

## 2014-03-20 DIAGNOSIS — M1990 Unspecified osteoarthritis, unspecified site: Secondary | ICD-10-CM | POA: Diagnosis present

## 2014-03-20 DIAGNOSIS — L89109 Pressure ulcer of unspecified part of back, unspecified stage: Secondary | ICD-10-CM | POA: Diagnosis present

## 2014-03-20 DIAGNOSIS — A4152 Sepsis due to Pseudomonas: Principal | ICD-10-CM | POA: Diagnosis present

## 2014-03-20 DIAGNOSIS — R627 Adult failure to thrive: Secondary | ICD-10-CM

## 2014-03-20 DIAGNOSIS — E871 Hypo-osmolality and hyponatremia: Secondary | ICD-10-CM | POA: Diagnosis present

## 2014-03-20 DIAGNOSIS — A419 Sepsis, unspecified organism: Secondary | ICD-10-CM | POA: Diagnosis present

## 2014-03-20 DIAGNOSIS — J690 Pneumonitis due to inhalation of food and vomit: Secondary | ICD-10-CM | POA: Diagnosis present

## 2014-03-20 DIAGNOSIS — Z79899 Other long term (current) drug therapy: Secondary | ICD-10-CM

## 2014-03-20 DIAGNOSIS — R1310 Dysphagia, unspecified: Secondary | ICD-10-CM | POA: Diagnosis present

## 2014-03-20 DIAGNOSIS — R918 Other nonspecific abnormal finding of lung field: Secondary | ICD-10-CM | POA: Diagnosis present

## 2014-03-20 DIAGNOSIS — G20A1 Parkinson's disease without dyskinesia, without mention of fluctuations: Secondary | ICD-10-CM | POA: Diagnosis present

## 2014-03-20 DIAGNOSIS — Z87440 Personal history of urinary (tract) infections: Secondary | ICD-10-CM

## 2014-03-20 DIAGNOSIS — R5383 Other fatigue: Secondary | ICD-10-CM

## 2014-03-20 DIAGNOSIS — G9341 Metabolic encephalopathy: Secondary | ICD-10-CM | POA: Diagnosis present

## 2014-03-20 DIAGNOSIS — G9340 Encephalopathy, unspecified: Secondary | ICD-10-CM

## 2014-03-20 DIAGNOSIS — R0902 Hypoxemia: Secondary | ICD-10-CM | POA: Diagnosis present

## 2014-03-20 DIAGNOSIS — M949 Disorder of cartilage, unspecified: Secondary | ICD-10-CM

## 2014-03-20 DIAGNOSIS — R531 Weakness: Secondary | ICD-10-CM

## 2014-03-20 DIAGNOSIS — L89150 Pressure ulcer of sacral region, unstageable: Secondary | ICD-10-CM | POA: Diagnosis present

## 2014-03-20 DIAGNOSIS — F329 Major depressive disorder, single episode, unspecified: Secondary | ICD-10-CM | POA: Diagnosis present

## 2014-03-20 DIAGNOSIS — IMO0002 Reserved for concepts with insufficient information to code with codable children: Secondary | ICD-10-CM

## 2014-03-20 DIAGNOSIS — Z823 Family history of stroke: Secondary | ICD-10-CM

## 2014-03-20 DIAGNOSIS — M899 Disorder of bone, unspecified: Secondary | ICD-10-CM | POA: Diagnosis present

## 2014-03-20 DIAGNOSIS — G20 Parkinson's disease: Secondary | ICD-10-CM | POA: Diagnosis present

## 2014-03-20 DIAGNOSIS — L8991 Pressure ulcer of unspecified site, stage 1: Secondary | ICD-10-CM | POA: Diagnosis present

## 2014-03-20 DIAGNOSIS — J189 Pneumonia, unspecified organism: Secondary | ICD-10-CM | POA: Diagnosis present

## 2014-03-20 DIAGNOSIS — E039 Hypothyroidism, unspecified: Secondary | ICD-10-CM | POA: Diagnosis present

## 2014-03-20 DIAGNOSIS — G929 Unspecified toxic encephalopathy: Secondary | ICD-10-CM

## 2014-03-20 DIAGNOSIS — I079 Rheumatic tricuspid valve disease, unspecified: Secondary | ICD-10-CM | POA: Diagnosis present

## 2014-03-20 DIAGNOSIS — Z9181 History of falling: Secondary | ICD-10-CM

## 2014-03-20 DIAGNOSIS — H3530 Unspecified macular degeneration: Secondary | ICD-10-CM | POA: Diagnosis present

## 2014-03-20 DIAGNOSIS — N400 Enlarged prostate without lower urinary tract symptoms: Secondary | ICD-10-CM | POA: Diagnosis present

## 2014-03-20 DIAGNOSIS — N318 Other neuromuscular dysfunction of bladder: Secondary | ICD-10-CM | POA: Diagnosis present

## 2014-03-20 DIAGNOSIS — G238 Other specified degenerative diseases of basal ganglia: Secondary | ICD-10-CM | POA: Diagnosis present

## 2014-03-20 DIAGNOSIS — G309 Alzheimer's disease, unspecified: Secondary | ICD-10-CM | POA: Diagnosis present

## 2014-03-20 DIAGNOSIS — N390 Urinary tract infection, site not specified: Secondary | ICD-10-CM | POA: Diagnosis present

## 2014-03-20 DIAGNOSIS — G92 Toxic encephalopathy: Secondary | ICD-10-CM

## 2014-03-20 DIAGNOSIS — G231 Progressive supranuclear ophthalmoplegia [Steele-Richardson-Olszewski]: Secondary | ICD-10-CM

## 2014-03-20 DIAGNOSIS — J811 Chronic pulmonary edema: Secondary | ICD-10-CM

## 2014-03-20 DIAGNOSIS — F3289 Other specified depressive episodes: Secondary | ICD-10-CM | POA: Diagnosis present

## 2014-03-20 DIAGNOSIS — E43 Unspecified severe protein-calorie malnutrition: Secondary | ICD-10-CM | POA: Diagnosis present

## 2014-03-20 DIAGNOSIS — Z87891 Personal history of nicotine dependence: Secondary | ICD-10-CM

## 2014-03-20 DIAGNOSIS — R4182 Altered mental status, unspecified: Secondary | ICD-10-CM | POA: Diagnosis present

## 2014-03-20 DIAGNOSIS — E876 Hypokalemia: Secondary | ICD-10-CM | POA: Diagnosis present

## 2014-03-20 DIAGNOSIS — Z7982 Long term (current) use of aspirin: Secondary | ICD-10-CM

## 2014-03-20 DIAGNOSIS — Z8673 Personal history of transient ischemic attack (TIA), and cerebral infarction without residual deficits: Secondary | ICD-10-CM

## 2014-03-20 DIAGNOSIS — K589 Irritable bowel syndrome without diarrhea: Secondary | ICD-10-CM | POA: Diagnosis present

## 2014-03-20 DIAGNOSIS — F0280 Dementia in other diseases classified elsewhere without behavioral disturbance: Secondary | ICD-10-CM | POA: Diagnosis present

## 2014-03-20 DIAGNOSIS — M810 Age-related osteoporosis without current pathological fracture: Secondary | ICD-10-CM | POA: Diagnosis present

## 2014-03-20 DIAGNOSIS — Z91011 Allergy to milk products: Secondary | ICD-10-CM

## 2014-03-20 DIAGNOSIS — R5381 Other malaise: Secondary | ICD-10-CM | POA: Diagnosis present

## 2014-03-20 DIAGNOSIS — R6510 Systemic inflammatory response syndrome (SIRS) of non-infectious origin without acute organ dysfunction: Secondary | ICD-10-CM | POA: Diagnosis present

## 2014-03-20 DIAGNOSIS — Z66 Do not resuscitate: Secondary | ICD-10-CM | POA: Diagnosis present

## 2014-03-20 DIAGNOSIS — G608 Other hereditary and idiopathic neuropathies: Secondary | ICD-10-CM | POA: Diagnosis present

## 2014-03-20 DIAGNOSIS — R509 Fever, unspecified: Secondary | ICD-10-CM | POA: Diagnosis present

## 2014-03-20 DIAGNOSIS — M109 Gout, unspecified: Secondary | ICD-10-CM | POA: Diagnosis present

## 2014-03-20 LAB — BLOOD GAS, ARTERIAL
ACID-BASE EXCESS: 1.8 mmol/L (ref 0.0–2.0)
Bicarbonate: 24.8 mEq/L — ABNORMAL HIGH (ref 20.0–24.0)
Drawn by: 276051
O2 Content: 2 L/min
O2 Saturation: 98 %
PCO2 ART: 34.7 mmHg — AB (ref 35.0–45.0)
PO2 ART: 102 mmHg — AB (ref 80.0–100.0)
Patient temperature: 98.6
TCO2: 22.4 mmol/L (ref 0–100)
pH, Arterial: 7.468 — ABNORMAL HIGH (ref 7.350–7.450)

## 2014-03-20 LAB — CBC WITH DIFFERENTIAL/PLATELET
Basophils Absolute: 0 10*3/uL (ref 0.0–0.1)
Basophils Relative: 0 % (ref 0–1)
Eosinophils Absolute: 0 10*3/uL (ref 0.0–0.7)
Eosinophils Relative: 0 % (ref 0–5)
HEMATOCRIT: 34.9 % — AB (ref 39.0–52.0)
Hemoglobin: 11.5 g/dL — ABNORMAL LOW (ref 13.0–17.0)
LYMPHS ABS: 1 10*3/uL (ref 0.7–4.0)
Lymphocytes Relative: 11 % — ABNORMAL LOW (ref 12–46)
MCH: 28.8 pg (ref 26.0–34.0)
MCHC: 33 g/dL (ref 30.0–36.0)
MCV: 87.5 fL (ref 78.0–100.0)
MONOS PCT: 7 % (ref 3–12)
Monocytes Absolute: 0.6 10*3/uL (ref 0.1–1.0)
NEUTROS ABS: 7.3 10*3/uL (ref 1.7–7.7)
NEUTROS PCT: 83 % — AB (ref 43–77)
Platelets: 225 10*3/uL (ref 150–400)
RBC: 3.99 MIL/uL — ABNORMAL LOW (ref 4.22–5.81)
RDW: 14.3 % (ref 11.5–15.5)
WBC: 8.8 10*3/uL (ref 4.0–10.5)

## 2014-03-20 LAB — URINALYSIS, ROUTINE W REFLEX MICROSCOPIC
Bilirubin Urine: NEGATIVE
GLUCOSE, UA: NEGATIVE mg/dL
Ketones, ur: NEGATIVE mg/dL
Nitrite: NEGATIVE
Protein, ur: NEGATIVE mg/dL
SPECIFIC GRAVITY, URINE: 1.012 (ref 1.005–1.030)
UROBILINOGEN UA: 0.2 mg/dL (ref 0.0–1.0)
pH: 7.5 (ref 5.0–8.0)

## 2014-03-20 LAB — URINE MICROSCOPIC-ADD ON

## 2014-03-20 LAB — I-STAT CHEM 8, ED
BUN: 12 mg/dL (ref 6–23)
CHLORIDE: 100 meq/L (ref 96–112)
CREATININE: 1 mg/dL (ref 0.50–1.35)
Calcium, Ion: 1.09 mmol/L — ABNORMAL LOW (ref 1.13–1.30)
Glucose, Bld: 110 mg/dL — ABNORMAL HIGH (ref 70–99)
HEMATOCRIT: 40 % (ref 39.0–52.0)
Hemoglobin: 13.6 g/dL (ref 13.0–17.0)
POTASSIUM: 4.6 meq/L (ref 3.7–5.3)
Sodium: 136 mEq/L — ABNORMAL LOW (ref 137–147)
TCO2: 27 mmol/L (ref 0–100)

## 2014-03-20 LAB — I-STAT TROPONIN, ED: Troponin i, poc: 0.03 ng/mL (ref 0.00–0.08)

## 2014-03-20 LAB — MRSA PCR SCREENING: MRSA by PCR: NEGATIVE

## 2014-03-20 LAB — I-STAT CG4 LACTIC ACID, ED: Lactic Acid, Venous: 1.51 mmol/L (ref 0.5–2.2)

## 2014-03-20 MED ORDER — VANCOMYCIN HCL 10 G IV SOLR
1500.0000 mg | INTRAVENOUS | Status: AC
  Administered 2014-03-20: 1500 mg via INTRAVENOUS
  Filled 2014-03-20: qty 1500

## 2014-03-20 MED ORDER — IPRATROPIUM-ALBUTEROL 0.5-2.5 (3) MG/3ML IN SOLN: 3.0000 mL | Freq: Four times a day (QID) | RESPIRATORY_TRACT | Status: DC

## 2014-03-20 MED ORDER — ROTIGOTINE 2 MG/24HR TD PT24
1.0000 | MEDICATED_PATCH | Freq: Every day | TRANSDERMAL | Status: DC
Start: 2014-03-21 — End: 2014-03-24

## 2014-03-20 MED ORDER — ONDANSETRON HCL 4 MG PO TABS
4.0000 mg | ORAL_TABLET | Freq: Four times a day (QID) | ORAL | Status: DC | PRN
Start: 2014-03-20 — End: 2014-03-28

## 2014-03-20 MED ORDER — ACETAMINOPHEN 650 MG RE SUPP
650.0000 mg | Freq: Four times a day (QID) | RECTAL | Status: DC | PRN
  Administered 2014-03-21 (×2): 650 mg via RECTAL
  Filled 2014-03-20 (×2): qty 1

## 2014-03-20 MED ORDER — VANCOMYCIN HCL IN DEXTROSE 750-5 MG/150ML-% IV SOLN
750.0000 mg | Freq: Two times a day (BID) | INTRAVENOUS | Status: AC
  Administered 2014-03-21 – 2014-03-26 (×12): 750 mg via INTRAVENOUS
  Filled 2014-03-20 (×13): qty 150

## 2014-03-20 MED ORDER — DEXTROSE-NACL 5-0.45 % IV SOLN
INTRAVENOUS | Status: DC
  Administered 2014-03-20 – 2014-03-22 (×3): via INTRAVENOUS

## 2014-03-20 MED ORDER — IPRATROPIUM BROMIDE 0.02 % IN SOLN: 0.5000 mg | Freq: Four times a day (QID) | RESPIRATORY_TRACT | Status: DC

## 2014-03-20 MED ORDER — IPRATROPIUM-ALBUTEROL 0.5-2.5 (3) MG/3ML IN SOLN
3.0000 mL | Freq: Three times a day (TID) | RESPIRATORY_TRACT | Status: DC
  Administered 2014-03-21 – 2014-03-25 (×13): 3 mL via RESPIRATORY_TRACT
  Filled 2014-03-20 (×13): qty 3

## 2014-03-20 MED ORDER — ASPIRIN EC 325 MG PO TBEC
325.0000 mg | DELAYED_RELEASE_TABLET | Freq: Every day | ORAL | Status: DC
  Administered 2014-03-23 – 2014-03-28 (×6): 325 mg via ORAL
  Filled 2014-03-20 (×9): qty 1

## 2014-03-20 MED ORDER — ALBUTEROL SULFATE (2.5 MG/3ML) 0.083% IN NEBU: 2.5000 mg | INHALATION_SOLUTION | Freq: Four times a day (QID) | RESPIRATORY_TRACT | Status: DC

## 2014-03-20 MED ORDER — VANCOMYCIN HCL IN DEXTROSE 1-5 GM/200ML-% IV SOLN
1000.0000 mg | Freq: Once | INTRAVENOUS | Status: DC
  Administered 2014-03-20: 1000 mg via INTRAVENOUS
  Filled 2014-03-20: qty 200

## 2014-03-20 MED ORDER — ENOXAPARIN SODIUM 40 MG/0.4ML ~~LOC~~ SOLN
40.0000 mg | SUBCUTANEOUS | Status: DC
  Administered 2014-03-20 – 2014-03-27 (×8): 40 mg via SUBCUTANEOUS
  Filled 2014-03-20 (×9): qty 0.4

## 2014-03-20 MED ORDER — BIOTENE DRY MOUTH MT LIQD
15.0000 mL | Freq: Two times a day (BID) | OROMUCOSAL | Status: DC
  Administered 2014-03-21 – 2014-03-28 (×15): 15 mL via OROMUCOSAL

## 2014-03-20 MED ORDER — COLLAGENASE 250 UNIT/GM EX OINT
1.0000 "application " | TOPICAL_OINTMENT | Freq: Every day | CUTANEOUS | Status: DC
  Administered 2014-03-21 – 2014-03-27 (×7): 1 via TOPICAL
  Filled 2014-03-20: qty 30

## 2014-03-20 MED ORDER — LEVOTHYROXINE SODIUM 25 MCG PO TABS
25.0000 ug | ORAL_TABLET | Freq: Every day | ORAL | Status: DC
  Filled 2014-03-20 (×3): qty 1

## 2014-03-20 MED ORDER — NYSTATIN 100000 UNIT/GM EX CREA
TOPICAL_CREAM | Freq: Two times a day (BID) | CUTANEOUS | Status: DC
  Administered 2014-03-20 – 2014-03-22 (×3): via TOPICAL
  Administered 2014-03-22: 1 via TOPICAL
  Administered 2014-03-23 – 2014-03-28 (×10): via TOPICAL
  Filled 2014-03-20: qty 15

## 2014-03-20 MED ORDER — CEFEPIME HCL 1 G IJ SOLR: 1.0000 g | INTRAMUSCULAR | Status: DC

## 2014-03-20 MED ORDER — HYDROCODONE-ACETAMINOPHEN 5-325 MG PO TABS
1.0000 | ORAL_TABLET | ORAL | Status: DC | PRN
  Administered 2014-03-22: 2 via ORAL
  Filled 2014-03-20: qty 2

## 2014-03-20 MED ORDER — FUROSEMIDE 10 MG/ML IJ SOLN
20.0000 mg | Freq: Two times a day (BID) | INTRAMUSCULAR | Status: DC
  Administered 2014-03-21: 20 mg via INTRAVENOUS
  Filled 2014-03-20 (×3): qty 2

## 2014-03-20 MED ORDER — ACETAMINOPHEN 325 MG PO TABS: 650.0000 mg | ORAL_TABLET | Freq: Four times a day (QID) | ORAL | Status: DC | PRN

## 2014-03-20 MED ORDER — DEXTROSE 5 % IV SOLN: 1.0000 g | Freq: Two times a day (BID) | INTRAVENOUS | Status: DC

## 2014-03-20 MED ORDER — FUROSEMIDE 10 MG/ML IJ SOLN
40.0000 mg | Freq: Once | INTRAMUSCULAR | Status: AC
  Administered 2014-03-20: 40 mg via INTRAVENOUS
  Filled 2014-03-20: qty 4

## 2014-03-20 MED ORDER — DEXTROSE 5 % IV SOLN
2.0000 g | Freq: Once | INTRAVENOUS | Status: AC
  Administered 2014-03-20: 2 g via INTRAVENOUS

## 2014-03-20 MED ORDER — POLYETHYLENE GLYCOL 3350 17 G PO PACK
17.0000 g | PACK | Freq: Every day | ORAL | Status: DC
  Administered 2014-03-23 – 2014-03-28 (×6): 17 g via ORAL
  Filled 2014-03-20 (×9): qty 1

## 2014-03-20 MED ORDER — HYDRALAZINE HCL 25 MG PO TABS
12.5000 mg | ORAL_TABLET | Freq: Two times a day (BID) | ORAL | Status: DC
  Administered 2014-03-21: 12.5 mg via ORAL
  Filled 2014-03-20 (×5): qty 0.5

## 2014-03-20 MED ORDER — ACETAMINOPHEN 500 MG PO TABS
1000.0000 mg | ORAL_TABLET | Freq: Three times a day (TID) | ORAL | Status: DC
  Administered 2014-03-21 – 2014-03-28 (×19): 1000 mg via ORAL
  Filled 2014-03-20 (×25): qty 2

## 2014-03-20 MED ORDER — ACETAMINOPHEN 650 MG RE SUPP
650.0000 mg | Freq: Once | RECTAL | Status: AC
  Administered 2014-03-20: 650 mg via RECTAL
  Filled 2014-03-20: qty 1

## 2014-03-20 MED ORDER — ONDANSETRON HCL 4 MG/2ML IJ SOLN: 4.0000 mg | Freq: Four times a day (QID) | INTRAMUSCULAR | Status: DC | PRN

## 2014-03-20 NOTE — ED Notes (Signed)
Family at bedside. 

## 2014-03-20 NOTE — ED Notes (Signed)
Per PT[facility]- Pt's presents with "typical UTI symptoms. Just released 1 week ago with same.". Pt resides with son at home. DNR HOWEVER NOT WITH PT.

## 2014-03-20 NOTE — ED Notes (Signed)
MD at bedside. 

## 2014-03-20 NOTE — ED Provider Notes (Signed)
CSN: [id number]     Arrival date & time 03/20/14  1539 History   First MD Initiated Contact with Patient 03/20/14 1551     Chief Complaint  Patient presents with  . Altered Mental Status  . Fever  . Urinary Tract Infection     (Consider location/radiation/quality/duration/timing/severity/associated sxs/prior Treatment) HPI 80 year old male with progressive supranuclear palsy with dementia, syncope, hypothyroidism, hypertension, parkinsonian syndrome, chronic UTIs on chronic Foley who was brought in here via [facility] from home for evaluation of fever and altered mental status. History obtained through son who is at bedside. Per son, patient has recurrent urinary tract infection requiring multiple hospitalizations. His last hospital admission was from April 22-April 27 for management of a recent fall and was treated for urinary tract infection. Urine culture shows sensitivity to Cipro. He was discharge with Cipro which he took for 6 days and finished 2 days ago.  Per son, patient was doing better for the first 2 days when he was discharge but for the past for 5 days he has had progressive weakness, or tenderness active. For the past 2 days he has been coughing violently. Today he was a temperature of MAXIMUM TEMPERATURE 101 at home and the son tries to give him Tylenol however patient refused second opinion by mouth. He has not been eating and drinking for the past 2 days. He is more withdrawn and less active and appears more altered. Symptoms felt that this is related to an unresolved urinary tract infection as he has behaved like this in the past when he was diagnosed with UTI. He is DO NOT RESUSCITATE.  Past Medical History  Diagnosis Date  . IBS (irritable bowel syndrome)   . Diverticulosis   . Cataract   . OAB (overactive bladder)   . Macular degeneration   . Alzheimer disease   . Hypertension   . Arthritis   . Osteoporosis   . Parkinsonian syndrome   . Syncope, vasovagal   . Neuromuscular  disorder     parkisonian syndrome  . 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 Unspecified transient cerebral ischemia   . Unspecified urinary incontinence   . Senile dementia, uncomplicated   . Unspecified constipation   . Hypothyroidism   . Anemia, unspecified   . Depression   . Alzheimer's disease   . Gout, unspecified   . Thoracic or lumbosacral neuritis or radiculitis, unspecified   . Progressive supranuclear palsy   . Unspecified transient cerebral ischemia   . Unspecified urinary incontinence   . Syncope and collapse   . Paralysis agitans   . Unspecified transient cerebral ischemia   . Pneumonitis due to inhalation of food or vomitus   . Cellulitis and abscess of oral soft tissues   . Screening for lipoid disorders   . Senile dementia, uncomplicated   . Unspecified constipation   . Disorder of bone and cartilage, unspecified   . Other specified acquired hypothyroidism   . Unspecified hypothyroidism   . Anemia, unspecified   . Depressive disorder, not elsewhere classified   . Alzheimer's disease   . Paralysis agitans   . Pneumonia, organism unspecified   . Pseudomonas infection in conditions classified elsewhere and of unspecified site   . Unspecified essential hypertension   . Urinary tract infection, site not specified   . Osteoarthrosis, unspecified whether generalized or localized, unspecified site   . Osteoporosis, unspecified   . Gout, unspecified   . Thoracic or lumbosacral neuritis or radiculitis, unspecified   .  Stroke    Past Surgical History  Procedure Laterality Date  . Esophagogastroduodenoscopy    . Eye surgery  2006    LEFT CATARACT   Family History  Problem Relation Age of Onset  . Cancer Mother   . Stroke Father   . Liver disease Father   . Kidney disease Brother   . Liver disease Brother   . Emphysema Brother    History  Substance Use Topics  . Smoking status: Former Research scientist (life sciences)  . Smokeless tobacco: Never Used      Comment: QUIT SMOKING BACK IN THE LATE 50'S  . Alcohol Use: No    Review of Systems  Unable to perform ROS: Dementia      Allergies  Other  Home Medications   Prior to Admission medications   Medication Sig Start Date End Date Taking? Authorizing Provider  acetaminophen (TYLENOL) 500 MG tablet Take 1,000 mg by mouth 3 (three) times daily.     Historical Provider, MD  aspirin EC 325 MG EC tablet Take 1 tablet (325 mg total) by mouth daily. 02/03/13   Ripudeep Krystal Eaton, MD  atorvastatin (LIPITOR) 10 MG tablet Take 1 tablet (10 mg total) by mouth daily at 6 PM. 08/29/13 08/29/14  Tiffany L Reed, DO  bacitracin ointment Apply 1 application topically 2 (two) times daily. Applied to tip of penis for skin tear.    Historical Provider, MD  Calcium Carbonate-Vit D-Min (CALTRATE 600+D PLUS) 600-800 MG-UNIT CHEW Chew 1 tablet by mouth 2 (two) times daily with a meal.     Historical Provider, MD  ciprofloxacin (CIPRO) 500 MG tablet Take 1 tablet (500 mg total) by mouth 2 (two) times daily. 03/12/14   Nishant Dhungel, MD  collagenase (SANTYL) ointment Apply 1 application topically daily. 03/02/14   Tiffany L Reed, DO  CRANBERRY PO Take 4,200 mg by mouth 3 (three) times daily.    Historical Provider, MD  Cyanocobalamin (VITAMIN B-12) 2500 MCG SUBL Place 5,000 mcg under the tongue daily.     Historical Provider, MD  D-Mannose POWD Take 2,000 mg by mouth 2 (two) times daily.    Historical Provider, MD  ferrous sulfate 325 (65 FE) MG tablet Take 325 mg by mouth every other day.     Historical Provider, MD  hydrALAZINE (APRESOLINE) 25 MG tablet Take 0.5 tablets (12.5 mg total) by mouth 2 (two) times daily. 08/29/13   Tiffany L Reed, DO  lactose free nutrition (BOOST) LIQD Take 90 mLs by mouth 2 (two) times daily between meals.     Historical Provider, MD  levothyroxine (SYNTHROID, LEVOTHROID) 25 MCG tablet Take 25 mcg by mouth daily before breakfast.    Historical Provider, MD  lidocaine (LIDODERM) 5 %  Place 2 patches onto the skin daily as needed (for back pain). Remove & Discard patch within 12 hours or as directed by MD 09/16/13   Tiffany L Reed, DO  MELATONIN EXTRA STRENGTH PO Take 15 mLs by mouth at bedtime as needed (sleep).     Historical Provider, MD  memantine (NAMENDA) 10 MG tablet Take 10 mg by mouth 2 (two) times daily.    Historical Provider, MD  Multiple Vitamin (MULTIVITAMIN WITH MINERALS) TABS Take 1 tablet by mouth daily.    Historical Provider, MD  NEUPRO 2 MG/24HR Place 1 patch onto the skin daily.  01/24/14   Historical Provider, MD  polyethylene glycol (MIRALAX / GLYCOLAX) packet Take 17 g by mouth daily.    Historical Provider, MD   SpO2  93% Physical Exam  Nursing note and vitals reviewed. Constitutional: He appears well-developed and well-nourished. No distress.  Patient is sleepy but arousable  HENT:  Head: Atraumatic.  Mouth/Throat: Oropharynx is clear and moist.  Eyes: Conjunctivae are normal.  Neck: Normal range of motion. Neck supple.  No nuchal rigidity  Cardiovascular: Normal rate and regular rhythm.   Pulmonary/Chest:  Decreased breath sounds without any obvious rales or rhonchi. No wheezes  Abdominal: Soft.  Genitourinary:  Indwelling Foley with Foley bag with a dark urine  Pressure ulcer noted to decubitus sacral region and in and and appears well healing  Neurological: He is alert.  Patient is an in a retracted state  Skin: No rash noted.  Pressure ulcer noted to bilateral dorsums of the elbow, appears noninfected  Psychiatric: He has a normal mood and affect.    ED Course  Procedures (including critical care time)  4:23 PM Pt is altered according to his son who is the power of attorney.  Patient has an indwelling Foley catheter so urine is likely can demonstrate signs of a UTI. He is mildly hypoxic with O2 of 91% on RA, has cough, has been recently hospitalized and concerning for HCAP.  Code sepsis called.  abx initiated.  Care discussed with  Dr. Vanita Panda.  Tylenol suppository given.  Pt currently hemodynamically stable.    8:03 PM Temp improved.  We had difficulty getting an 18 french foley to replace pt's foley catheter, thus delaying UA.  Will consult for admission as soon as UA resulted.    8:40 PM Will consult for admission due to AMS with suspect HCAP.  I have consulted with [facility], Dr. Laren Everts who agrees to admit pt to med surg, team 8 under his care.    8:56 PM Dr. Laren Everts have evaluated pt and felt infectious source likely UTI.  Does think pt has CHF, and recommend given Lasix 40mg  IV.  I will place order per request.    Labs Review Labs Reviewed  CBC WITH DIFFERENTIAL - Abnormal; Notable for the following:    RBC 3.99 (*)    Hemoglobin 11.5 (*)    HCT 34.9 (*)    Neutrophils Relative % 83 (*)    Lymphocytes Relative 11 (*)    All other components within normal limits  URINALYSIS, ROUTINE W REFLEX MICROSCOPIC - Abnormal; Notable for the following:    APPearance CLOUDY (*)    Hgb urine dipstick MODERATE (*)    Leukocytes, UA LARGE (*)    All other components within normal limits  BLOOD GAS, ARTERIAL - Abnormal; Notable for the following:    pH, Arterial 7.468 (*)    pCO2 arterial 34.7 (*)    pO2, Arterial 102.0 (*)    Bicarbonate 24.8 (*)    All other components within normal limits  URINE MICROSCOPIC-ADD ON - Abnormal; Notable for the following:    Bacteria, UA FEW (*)    All other components within normal limits  I-STAT CHEM 8, ED - Abnormal; Notable for the following:    Sodium 136 (*)    Glucose, Bld 110 (*)    Calcium, Ion 1.09 (*)    All other components within normal limits  URINE CULTURE  CULTURE, BLOOD (ROUTINE X 2)  CULTURE, BLOOD (ROUTINE X 2)  I-STAT CG4 LACTIC ACID, ED  Randolm Idol, ED    Imaging Review Dg Chest Port 1 View  03/20/2014   CLINICAL DATA:  r/o pna  EXAM: PORTABLE CHEST - 1 VIEW  COMPARISON:  DG CHEST 1 VIEW dated 03/08/2014  FINDINGS: Low lung volumes. Cardiac  silhouette is enlarged. The aorta is tortuous with atherosclerotic calcifications. There is prominence of the interstitial markings without peribronchial cuffing. Linear areas of density appreciated within the lung bases. There is minimal blunting of the costophrenic angles. Degenerative changes appreciated within the shoulders.  IMPRESSION: Pulmonary vascular congestion with a underlying component of chronic bronchitic changes. Scarring versus atelectasis within the lung bases. There also findings likely reflecting a permanent COPD. Degenerative changes within the shoulders.   Electronically Signed   By: Margaree Mackintosh M.D.   On: 03/20/2014 17:34     EKG Interpretation None      MDM   Final diagnoses:  HCAP (healthcare-associated pneumonia)  Altered mental status  UTI (lower urinary tract infection)   BP 153/75  Pulse 69  Temp(Src) 100.6 F (38.1 C) (Rectal)  Resp 21  Ht 5\' 10"  (1.778 m)  Wt 165 lb (74.844 kg)  BMI 23.68 kg/m2  SpO2 98%  I have reviewed nursing notes and vital signs. I personally reviewed the imaging tests through PACS system  I reviewed available ER/hospitalization records thought the EMR     Domenic Moras, PA-C 03/20/14 2044  Domenic Moras, PA-C 03/20/14 2057

## 2014-03-20 NOTE — ED Provider Notes (Signed)
  This was a shared visit with a mid-level provided (NP or PA).  Throughout the patient's course I was available for consultation/collaboration.  I saw the ECG (if appropriate), relevant labs and studies - I agree with the interpretation.  On my exam the patient was in no distress. However, this is typical for him, withdrawn, demented. I have taken care of this patient before, and he appears in some condition. I discussed his presentation at length with his son. Given the patient's fever, hypoxia, there is immediate suspicion of sepsis, the patient received empiric antibiotics. Patient's evaluation demonstrates likely urinary tract infection. Patient was admitted for further evaluation and management.     Carmin Muskrat, MD 03/20/14 [phone]

## 2014-03-20 NOTE — Telephone Encounter (Signed)
Patient son [name] called and stated "its too late to deal with the Urine Culture" they have now called 911 and going to [facility]. Informed Dr. Mariea Clonts.

## 2014-03-20 NOTE — ED Notes (Signed)
Bed: WA01 Expected date:  Expected time:  Means of arrival:  Comments: EMS-possible UTI 

## 2014-03-20 NOTE — Progress Notes (Addendum)
[facility]                                                                                    Patient Demographics  Ricky [name], is a 80 y.o. male  CSN: [id number]  MRN: [id number]  DOB - May 20, 1917  Admit Date - 03/20/2014  Outpatient Primary MD for the patient is REED, TIFFANY, DO   With History of -  Past Medical History  Diagnosis Date  . IBS (irritable bowel syndrome)   . Diverticulosis   . Cataract   . OAB (overactive bladder)   . Macular degeneration   . Alzheimer disease   . Hypertension   . Arthritis   . Osteoporosis   . Parkinsonian syndrome   . Syncope, vasovagal   . Neuromuscular disorder     parkisonian syndrome  . 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 Unspecified transient cerebral ischemia   . Unspecified urinary incontinence   . Senile dementia, uncomplicated   . Unspecified constipation   . Hypothyroidism   . Anemia, unspecified   . Depression   . Alzheimer's disease   . Gout, unspecified   . Thoracic or lumbosacral neuritis or radiculitis, unspecified   . Progressive supranuclear palsy   . Unspecified transient cerebral ischemia   . Unspecified urinary incontinence   . Syncope and collapse   . Paralysis agitans   . Unspecified transient cerebral ischemia   . Pneumonitis due to inhalation of food or vomitus   . Cellulitis and abscess of oral soft tissues   . Screening for lipoid disorders   . Senile dementia, uncomplicated   . Unspecified constipation   . Disorder of bone and cartilage, unspecified   . Other specified acquired hypothyroidism   . Unspecified hypothyroidism   . Anemia, unspecified   . Depressive disorder, not elsewhere classified   . Alzheimer's disease   . Paralysis agitans   . Pneumonia, organism unspecified   . Pseudomonas infection in conditions classified elsewhere and of unspecified site   . Unspecified essential hypertension   . Urinary tract infection, site not  specified   . Osteoarthrosis, unspecified whether generalized or localized, unspecified site   . Osteoporosis, unspecified   . Gout, unspecified   . Thoracic or lumbosacral neuritis or radiculitis, unspecified   . Stroke       Past Surgical History  Procedure Laterality Date  . Esophagogastroduodenoscopy    . Eye surgery  2006    LEFT CATARACT    in for   Chief Complaint  Patient presents with  . Altered Mental Status  . Fever  . Urinary Tract Infection     HPI  Ricky [name]  is a 80 y.o. male, with history of advanced parkinsonism and dementia with supranuclear palsy and a chronic indwelling catheter, brought by his son who takes care of him at home for altered mental status and fever . Unfortunately the son is not at bedside and most of the history was from the ER records and old records from the last admission on 03/09/2014. Patient is nonverbal and confused.    Review of Systems   Unable to obtain due  to patient's condition Social History History  Substance Use Topics  . Smoking status: Former Research scientist (life sciences)  . Smokeless tobacco: Never Used     Comment: QUIT SMOKING BACK IN THE LATE 50'S  . Alcohol Use: No     Family History Family History  Problem Relation Age of Onset  . Cancer Mother   . Stroke Father   . Liver disease Father   . Kidney disease Brother   . Liver disease Brother   . Emphysema Brother      Prior to Admission medications   Medication Sig Start Date End Date Taking? Authorizing Provider  acetaminophen (TYLENOL) 500 MG tablet Take 1,000 mg by mouth 3 (three) times daily.    Yes Historical Provider, MD  aspirin EC 325 MG EC tablet Take 1 tablet (325 mg total) by mouth daily. 02/03/13  Yes Ripudeep Krystal Eaton, MD  atorvastatin (LIPITOR) 10 MG tablet Take 1 tablet (10 mg total) by mouth daily at 6 PM. 08/29/13 08/29/14 Yes Tiffany L Reed, DO  bacitracin ointment Apply 1 application topically 2 (two) times daily. Applied to tip of penis for skin tear.    Yes Historical Provider, MD  Calcium Carbonate-Vit D-Min (CALTRATE 600+D PLUS) 600-800 MG-UNIT CHEW Chew 1 tablet by mouth 2 (two) times daily with a meal.    Yes Historical Provider, MD  collagenase (SANTYL) ointment Apply 1 application topically daily. 03/02/14  Yes Tiffany L Reed, DO  CRANBERRY PO Take 4,200 mg by mouth 3 (three) times daily.   Yes Historical Provider, MD  Cyanocobalamin (VITAMIN B-12) 2500 MCG SUBL Place 5,000 mcg under the tongue daily.    Yes Historical Provider, MD  D-Mannose POWD Take 2,000 mg by mouth 2 (two) times daily.   Yes Historical Provider, MD  ferrous sulfate 325 (65 FE) MG tablet Take 325 mg by mouth every other day.    Yes Historical Provider, MD  hydrALAZINE (APRESOLINE) 25 MG tablet Take 0.5 tablets (12.5 mg total) by mouth 2 (two) times daily. 08/29/13  Yes Tiffany L Reed, DO  lactose free nutrition (BOOST) LIQD Take 90 mLs by mouth 2 (two) times daily between meals.    Yes Historical Provider, MD  levothyroxine (SYNTHROID, LEVOTHROID) 25 MCG tablet Take 25 mcg by mouth daily before breakfast.   Yes Historical Provider, MD  lidocaine (LIDODERM) 5 % Place 2 patches onto the skin daily as needed (for back pain). Remove & Discard patch within 12 hours or as directed by MD 09/16/13  Yes Tiffany L Reed, DO  MELATONIN EXTRA STRENGTH PO Take 15 mLs by mouth at bedtime as needed (sleep).    Yes Historical Provider, MD  memantine (NAMENDA) 10 MG tablet Take 10 mg by mouth 2 (two) times daily.   Yes Historical Provider, MD  Multiple Vitamin (MULTIVITAMIN WITH MINERALS) TABS Take 1 tablet by mouth daily.   Yes Historical Provider, MD  NEUPRO 2 MG/24HR Place 1 patch onto the skin daily.  01/24/14  Yes Historical Provider, MD  polyethylene glycol (MIRALAX / GLYCOLAX) packet Take 17 g by mouth daily.   Yes Historical Provider, MD  Probiotic Product (PROBIOTIC DAILY) CAPS Take 1 capsule by mouth daily.   Yes Historical Provider, MD    Allergies  Allergen Reactions  . Other  Other (See Comments)    Dairy products- cause swallowing difficulty     Physical Exam  Vitals  Blood pressure 161/93, pulse 91, temperature 100.6 F (38.1 C), temperature source Rectal, resp. rate 25, height 5\' 10"  (1.778  m), weight 74.844 kg (165 lb), SpO2 96.00%.   1. General elderly African American male, nonverbal and contracted  2. neurologically unable to evaluate.  3. unable to evaluate psychiatric-wise  4. Ears and Eyes appear Normal, Conjunctivae clear,. Moist Oral Mucosa.  5. stiff Neck, No JVD, No cervical lymphadenopathy appriciated, No Carotid Bruits.  6. Symmetrical Chest wall movement, poor inspiratory effort,   7. RRR, No Gallops, Rubs or Murmurs, No Parasternal Heave.  8. Positive Bowel Sounds, Abdomen Soft, Non tender, No organomegaly appriciated,No rebound -guarding or rigidity.  9.  No Cyanosis, Normal Skin Turgor, stage I sacral decubitus ulcer looks clean.  10. Joints contracted.   Data Review  CBC  Recent Labs Lab 03/20/14 1635 03/20/14 1655  WBC 8.8  --   HGB 11.5* 13.6  HCT 34.9* 40.0  PLT 225  --   MCV 87.5  --   MCH 28.8  --   MCHC 33.0  --   RDW 14.3  --   LYMPHSABS 1.0  --   MONOABS 0.6  --   EOSABS 0.0  --   BASOSABS 0.0  --    ------------------------------------------------------------------------------------------------------------------  Chemistries   Recent Labs Lab 03/20/14 1655  NA 136*  K 4.6  CL 100  GLUCOSE 110*  BUN 12  CREATININE 1.00   ------------------------------------------------------------------------------------------------------------------ estimated creatinine clearance is 46.6 ml/min (by C-G formula based on Cr of 1). ------------------------------------------------------------------------------------------------------------------ No results found for this basename: TSH, T4TOTAL, FREET3, T3FREE, THYROIDAB,  in the last 72 hours   Coagulation profile No results found for this basename: INR,  PROTIME,  in the last 168 hours ------------------------------------------------------------------------------------------------------------------- No results found for this basename: DDIMER,  in the last 72 hours -------------------------------------------------------------------------------------------------------------------  Cardiac Enzymes No results found for this basename: CK, CKMB, TROPONINI, MYOGLOBIN,  in the last 168 hours ------------------------------------------------------------------------------------------------------------------ No components found with this basename: POCBNP,    ---------------------------------------------------------------------------------------------------------------  Urinalysis    Component Value Date/Time   COLORURINE YELLOW 03/20/2014 1941   APPEARANCEUR CLOUDY* 03/20/2014 1941   LABSPEC 1.012 03/20/2014 1941   PHURINE 7.5 03/20/2014 1941   GLUCOSEU NEGATIVE 03/20/2014 1941   HGBUR MODERATE* 03/20/2014 1941   BILIRUBINUR NEGATIVE 03/20/2014 1941   BILIRUBINUR Neg 01/24/2014 1151   BILIRUBINUR Negative 09/20/2013 0919   KETONESUR NEGATIVE 03/20/2014 1941   PROTEINUR NEGATIVE 03/20/2014 1941   PROTEINUR 300 01/24/2014 1151   UROBILINOGEN 0.2 03/20/2014 1941   UROBILINOGEN 0.2 01/24/2014 1151   NITRITE NEGATIVE 03/20/2014 1941   NITRITE Neg 01/24/2014 1151   NITRITE Negative 09/20/2013 0919   LEUKOCYTESUR LARGE* 03/20/2014 1941   LEUKOCYTESUR 3+* 09/20/2013 0919    ----------------------------------------------------------------------------------------------------------------   Imaging results:   Dg Chest 1 View  03/08/2014   CLINICAL DATA:  Fall  EXAM: CHEST - 1 VIEW  COMPARISON:  DG THORACIC SPINE dated 03/08/2014; DG CHEST 2 VIEW dated 01/24/2014  FINDINGS: Stable right upper lobe lung nodules. Lungs are under aerated with scattered volume loss. Cardiomegaly. No pneumothorax.  IMPRESSION: Stable right upper lobe nodules.  Low volumes.   Electronically Signed   By:  Maryclare Bean M.D.   On: 03/08/2014 22:58   Dg Cervical Spine 2-3 Views  03/08/2014   CLINICAL DATA:  Fall  EXAM: CERVICAL SPINE - 2-3 VIEW  COMPARISON:  None.  FINDINGS: Only a single lateral cervical spine view is submitted. There is reversed cervical lordosis. There is severe narrowing at virtually every level in the cervical spine. The image is rotated. Detail is obscured by positioning. Prevertebral soft tissues  are borderline prominent. This may be due to positioning.  IMPRESSION: Limited exam as described. Injury cannot be excluded by this study. Advanced degenerative changes. Prevertebral soft tissues are borderline prominent.   Electronically Signed   By: Maryclare Bean M.D.   On: 03/08/2014 23:00   Dg Thoracic Spine 2 View  03/08/2014   CLINICAL DATA:  Fall  EXAM: THORACIC SPINE - 2 VIEW  COMPARISON:  DG CHEST 2 VIEW dated 01/24/2014  FINDINGS: Stable L1 compression deformity. Osteopenia. Kyphosis. No definite new compression deformities.  IMPRESSION: Stable L1 compression fracture.   Electronically Signed   By: Maryclare Bean M.D.   On: 03/08/2014 22:56   Dg Lumbar Spine Complete  03/08/2014   CLINICAL DATA:  Fall  EXAM: LUMBAR SPINE - COMPLETE 4+ VIEW  COMPARISON:  DG LUMBAR SPINE 2-3 VIEWS dated 01/15/2014  FINDINGS: Stable L1 compression fracture. Osteopenia. No new compression deformities. Degenerative changes are noted.  IMPRESSION: No acute bony pathology.  Stable L1 compression fracture.   Electronically Signed   By: Maryclare Bean M.D.   On: 03/08/2014 22:57   Dg Elbow 2 Views Right  03/08/2014   CLINICAL DATA:  Fall  EXAM: RIGHT ELBOW - 2 VIEW  COMPARISON:  None.  FINDINGS: No acute fracture.  No dislocation.  IMPRESSION: No acute bony pathology.   Electronically Signed   By: Maryclare Bean M.D.   On: 03/08/2014 22:56   Dg Chest Port 1 View  03/20/2014   CLINICAL DATA:  r/o pna  EXAM: PORTABLE CHEST - 1 VIEW  COMPARISON:  DG CHEST 1 VIEW dated 03/08/2014  FINDINGS: Low lung volumes. Cardiac silhouette is  enlarged. The aorta is tortuous with atherosclerotic calcifications. There is prominence of the interstitial markings without peribronchial cuffing. Linear areas of density appreciated within the lung bases. There is minimal blunting of the costophrenic angles. Degenerative changes appreciated within the shoulders.  IMPRESSION: Pulmonary vascular congestion with a underlying component of chronic bronchitic changes. Scarring versus atelectasis within the lung bases. There also findings likely reflecting a permanent COPD. Degenerative changes within the shoulders.   Electronically Signed   By: Margaree Mackintosh M.D.   On: 03/20/2014 17:34    My personal review of EKG:  Personally reviewed Old Chart from 03/09/2014  Assessment & Plan  1. SIRS/sepsis syndrome with fever and altered mental status, probably urine related 2. Pulmonary edema with mild hypoxemia on an already compromised lungs due to chronic disease last echocardiogram on 3 2015 showed normal ejection fraction &tivial tricuspid regurgitation 3. Urinary tract infection, chronic with chronic indwelling catheter 4. Advanced parkinsonism and dementia with supranuclear palsy 5. Sacral decubitus ulcer stage I clean  Plan  Check urine culture Cefepime IV Already received vancomycin in the emergency room for concern about HCAPS Lasix IV Check TSH    DVT Prophylaxis Lovenox  AM Labs Ordered, also please review Full Orders  Code Status DO NOT RESUSCITATE  Disposition Plan: Home with home health versus nursing home  Time spent in minutes : 38 minutes  Condition GUARDED   @SIGNATURE @

## 2014-03-20 NOTE — ED Notes (Addendum)
MD at bedside. EDP LOCKWOOD PRESENT TO EVALUATE THIS PT

## 2014-03-20 NOTE — Progress Notes (Signed)
ANTIBIOTIC CONSULT NOTE - INITIAL  Pharmacy Consult for Vancomycin / Cefepime Indication: HCAP  Allergies  Allergen Reactions  . Other Other (See Comments)    Dairy products- cause swallowing difficulty     Patient Measurements:   Adjusted Body Weight:   Vital Signs: Temp: 101.3 F (38.5 C) (05/04 1617) Temp src: Rectal (05/04 1617) BP: 158/76 mmHg (05/04 1617) Pulse Rate: 84 (05/04 1617) Intake/Output from previous day:   Intake/Output from this shift:    Labs: No results found for this basename: WBC, HGB, PLT, LABCREA, CREATININE,  in the last 72 hours The CrCl is unknown because both a height and weight (above a minimum accepted value) are required for this calculation. No results found for this basename: Letta Median, VANCORANDOM, GENTTROUGH, GENTPEAK, GENTRANDOM, TOBRATROUGH, TOBRAPEAK, TOBRARND, AMIKACINPEAK, AMIKACINTROU, AMIKACIN,  in the last 72 hours   Microbiology: Recent Results (from the past 720 hour(s))  URINE CULTURE     Status: None   Collection Time    03/08/14 11:00 PM      Result Value Ref Range Status   Specimen Description URINE, CATHETERIZED   Final   Special Requests NONE   Final   Culture  Setup Time     Final   Value: 03/09/2014 00:59     Performed at [facility]     Final   Value: >=100,000 COLONIES/ML     Performed at [facility]   Culture     Final   Value: [place]     Performed at [facility]   Report Status 03/13/2014 FINAL   Final   Organism ID, Bacteria PROTEUS MIRABILIS   Final   Organism ID, Bacteria PSEUDOMONAS AERUGINOSA   Final  MRSA PCR SCREENING     Status: None   Collection Time    03/09/14  2:49 AM      Result Value Ref Range Status   MRSA by PCR NEGATIVE  NEGATIVE Final   Comment:            The GeneXpert MRSA Assay (FDA     approved for NASAL specimens     only), is one component of a     comprehensive MRSA colonization   surveillance program. It is not     intended to diagnose MRSA     infection nor to guide or     monitor treatment for     MRSA infections.    Medical History: Past Medical History  Diagnosis Date  . IBS (irritable bowel syndrome)   . Diverticulosis   . Cataract   . OAB (overactive bladder)   . Macular degeneration   . Alzheimer disease   . Hypertension   . Arthritis   . Osteoporosis   . Parkinsonian syndrome   . Syncope, vasovagal   . Neuromuscular disorder     parkisonian syndrome  . 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 Unspecified transient cerebral ischemia   . Unspecified urinary incontinence   . Senile dementia, uncomplicated   . Unspecified constipation   . Hypothyroidism   . Anemia, unspecified   . Depression   . Alzheimer's disease   . Gout, unspecified   . Thoracic or lumbosacral neuritis or radiculitis, unspecified   . Progressive supranuclear palsy   . Unspecified transient cerebral ischemia   . Unspecified urinary incontinence   . Syncope and collapse   . Paralysis agitans   .  Unspecified transient cerebral ischemia   . Pneumonitis due to inhalation of food or vomitus   . Cellulitis and abscess of oral soft tissues   . Screening for lipoid disorders   . Senile dementia, uncomplicated   . Unspecified constipation   . Disorder of bone and cartilage, unspecified   . Other specified acquired hypothyroidism   . Unspecified hypothyroidism   . Anemia, unspecified   . Depressive disorder, not elsewhere classified   . Alzheimer's disease   . Paralysis agitans   . Pneumonia, organism unspecified   . Pseudomonas infection in conditions classified elsewhere and of unspecified site   . Unspecified essential hypertension   . Urinary tract infection, site not specified   . Osteoarthrosis, unspecified whether generalized or localized, unspecified site   . Osteoporosis, unspecified   . Gout, unspecified   . Thoracic or  lumbosacral neuritis or radiculitis, unspecified   . Stroke    Assessment: 13 yoM with PMHx progressive supranuclear palsy with dementia and parkinsons with chronic UTIs on chronic foley and recent admission for fall and UTI presents 5/4 to [facility] with AMS, fever, mild hypoxia and cough.  Pharmacy consulted to dose vancomycin and cefepime for sepsis 2/2 possible HCAP and possible UTI.  First doses already ordered.   Outpatient cipro 4/27-5/2 to complete UTI tx 5/4 >> Vancomycin  >> 5/4 >> Cefepime  >>    Tmax: 101.3 WBCs: 8.8K Renal: SCr 1.0, CrCl 47 (N45)  5/4 blood: ordered 5/4 urine: ordered   Goal of Therapy:  Vancomycin trough level 15-20 mcg/ml Eradication of infection  Plan:  Change to Vancomycin 1500mg  loading dose  Maintenance dose Vancomycin 750mg  IV q12h Cefepime 1g IVq24h F/u renal fxn, cultures, clinical course  Ralene Bathe, PharmD, BCPS 03/20/2014, 4:51 PM  Pager: [phone]

## 2014-03-21 ENCOUNTER — Telehealth: Payer: Self-pay | Admitting: *Deleted

## 2014-03-21 DIAGNOSIS — F0280 Dementia in other diseases classified elsewhere without behavioral disturbance: Secondary | ICD-10-CM

## 2014-03-21 DIAGNOSIS — E43 Unspecified severe protein-calorie malnutrition: Secondary | ICD-10-CM | POA: Diagnosis present

## 2014-03-21 DIAGNOSIS — G309 Alzheimer's disease, unspecified: Secondary | ICD-10-CM

## 2014-03-21 DIAGNOSIS — G9340 Encephalopathy, unspecified: Secondary | ICD-10-CM

## 2014-03-21 DIAGNOSIS — J189 Pneumonia, unspecified organism: Secondary | ICD-10-CM

## 2014-03-21 LAB — BASIC METABOLIC PANEL
BUN: 9 mg/dL (ref 6–23)
CO2: 25 meq/L (ref 19–32)
Calcium: 8.9 mg/dL (ref 8.4–10.5)
Chloride: 99 mEq/L (ref 96–112)
Creatinine, Ser: 0.8 mg/dL (ref 0.50–1.35)
GFR calc Af Amer: 86 mL/min — ABNORMAL LOW (ref >90)
GFR calc non Af Amer: 74 mL/min — ABNORMAL LOW (ref >90)
GLUCOSE: 107 mg/dL — AB (ref 70–99)
POTASSIUM: 3.5 meq/L — AB (ref 3.7–5.3)
SODIUM: 137 meq/L (ref 137–147)

## 2014-03-21 LAB — URINE CULTURE: Colony Count: >=100000

## 2014-03-21 LAB — PRO B NATRIURETIC PEPTIDE: PRO B NATRI PEPTIDE: 3769 pg/mL — AB (ref 0–450)

## 2014-03-21 LAB — CBC
HCT: 33.2 % — ABNORMAL LOW (ref 39.0–52.0)
HEMOGLOBIN: 11 g/dL — AB (ref 13.0–17.0)
MCH: 28.5 pg (ref 26.0–34.0)
MCHC: 32.8 g/dL (ref 30.0–36.0)
MCV: 86.7 fL (ref 78.0–100.0)
Platelets: 209 10*3/uL (ref 150–400)
RBC: 3.83 MIL/uL — AB (ref 4.22–5.81)
RDW: 14.2 % (ref 11.5–15.5)
WBC: 6.8 10*3/uL (ref 4.0–10.5)

## 2014-03-21 LAB — TSH: TSH: 0.804 u[IU]/mL (ref 0.350–4.500)

## 2014-03-21 MED ORDER — CLINDAMYCIN PHOSPHATE 600 MG/50ML IV SOLN
600.0000 mg | Freq: Three times a day (TID) | INTRAVENOUS | Status: AC
  Administered 2014-03-21 – 2014-03-26 (×17): 600 mg via INTRAVENOUS
  Filled 2014-03-21 (×17): qty 50

## 2014-03-21 MED ORDER — DEXTROSE 5 % IV SOLN
1.0000 g | Freq: Two times a day (BID) | INTRAVENOUS | Status: AC
  Administered 2014-03-21 – 2014-03-26 (×12): 1 g via INTRAVENOUS
  Filled 2014-03-21 (×12): qty 1

## 2014-03-21 NOTE — Progress Notes (Signed)
INITIAL NUTRITION ASSESSMENT  DOCUMENTATION CODES Per approved criteria  -Severe malnutrition in the context of chronic illness  Pt meets criteria for severe MALNUTRITION in the context of chronic disease as evidenced by severe and moderate muscle wasting and subcutaneous fat loss, 8% weight loss in 3 months, and likely PO intake <75% for > one month.  INTERVENTION: -Diet advancement per MD -Recommend SLP as status improves -Will continue to monitor  NUTRITION DIAGNOSIS: Inadequate oral intake  related to inability to eat as evidenced by NPO status.   Goal: Pt to meet >/= 90% of their estimated nutrition needs    Monitor:  Diet order, swallow profile, total protein/energy intake, labs, weights, skin integrity  Reason for Assessment: MST  80 y.o. male  Admitting Dx: Fever  ASSESSMENT: Ricky [name] is a 80 y.o. male, with history of advanced parkinsonism and dementia with supranuclear palsy and a chronic indwelling catheter, brought by his son who takes care of him at home for altered mental status and fever .  -Pt nonverbal, unable to attain additional food/nutrition related information -Per MST screen, pt had been on dysphagia diet (chopped and puree foods) with thickened liquids -Pt has lost approximately 10-15 lbs since 11/2013 -Currently NPO d/t AMS. Per discussion with RN, pt has been unable to take medications and would unlikely be able to tolerate PO intake. Recommend SLP evaluation as status improves -Pt had been receiving Boost 90 ml BID during previous admit in 03/09/14 and with 85% PO intake per previous medical records.  -Had also tried [employer] during admit in 05/2014 as Boost increased pt's secretion. Will monitor diet advancement and supplement as warranted/as tolerated -Muscle wasting and subcutaneous fat loss may also be partially attributed to aging process -Nutrition Focused Physical Exam:  Subcutaneous Fat:  Orbital Region: WDL Upper Arm Region:  moderate wasting Thoracic and Lumbar Region: n/a  Muscle:  Temple Region: moderate Clavicle Bone Region: severe Clavicle and Acromion Bone Region: moderate Scapular Bone Region: n/a Dorsal Hand: moderate Patellar Region: WDL Anterior Thigh Region: moderate Posterior Calf Region: moderate  Edema: none noted    Height: Ht Readings from Last 1 Encounters:  03/20/14 5\' 10"  (1.778 m)    Weight: Wt Readings from Last 1 Encounters:  03/20/14 165 lb (74.844 kg)    Ideal Body Weight: 166 lbs  % Ideal Body Weight: 99%  Wt Readings from Last 10 Encounters:  03/20/14 165 lb (74.844 kg)  03/13/14 145 lb 15.1 oz (66.2 kg)  01/16/14 150 lb 2.1 oz (68.1 kg)  10/31/13 170 lb (77.111 kg)  10/20/13 175 lb (79.379 kg)  09/09/13 175 lb (79.379 kg)  06/08/13 166 lb 7.2 oz (75.5 kg)  05/15/13 180 lb (81.647 kg)  05/02/13 178 lb (80.74 kg)  03/07/13 177 lb (80.287 kg)    Usual Body Weight: 178 lbs per previous RD note  % Usual Body Weight: 93%  BMI:  Body mass index is 23.68 kg/(m^2).  Estimated Nutritional Needs: Kcal: 2000-2200 Protein: 95-105 Fluid:>/=2200 ml/daily  Skin: stage 1 sacral ulcer  Diet Order: NPO  EDUCATION NEEDS: -No education needs identified at this time   Intake/Output Summary (Last 24 hours) at 03/21/14 1013 Last data filed at 03/21/14 0522  Gross per 24 hour  Intake 465.83 ml  Output   2451 ml  Net -1985.17 ml    Last BM: 5/04  Labs:   Recent Labs Lab 03/20/14 1655 03/21/14 0312  NA 136* 137  K 4.6 3.5*  CL 100 99  CO2  --  25  BUN 12 9  CREATININE 1.00 0.80  CALCIUM  --  8.9  GLUCOSE 110* 107*    CBG (last 3)  No results found for this basename: GLUCAP,  in the last 72 hours  Scheduled Meds: . acetaminophen  1,000 mg Oral TID  . antiseptic oral rinse  15 mL Mouth Rinse BID  . aspirin EC  325 mg Oral Daily  . ceFEPime (MAXIPIME) IV  1 g Intravenous Q12H  . collagenase  1 application Topical Daily  . enoxaparin (LOVENOX)  injection  40 mg Subcutaneous Q24H  . furosemide  20 mg Intravenous BID  . hydrALAZINE  12.5 mg Oral BID  . ipratropium-albuterol  3 mL Nebulization TID  . levothyroxine  25 mcg Oral QAC breakfast  . nystatin cream   Topical BID  . polyethylene glycol  17 g Oral Daily  . rotigotine  1 patch Transdermal Daily  . vancomycin  750 mg Intravenous Q12H    Continuous Infusions: . dextrose 5 % and 0.45% NaCl 50 mL/hr at 03/20/14 2203    Past Medical History  Diagnosis Date  . IBS (irritable bowel syndrome)   . Diverticulosis   . Cataract   . OAB (overactive bladder)   . Macular degeneration   . Alzheimer disease   . Hypertension   . Arthritis   . Osteoporosis   . Parkinsonian syndrome   . Syncope, vasovagal   . Neuromuscular disorder     parkisonian syndrome  . 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 Unspecified transient cerebral ischemia   . Unspecified urinary incontinence   . Senile dementia, uncomplicated   . Unspecified constipation   . Hypothyroidism   . Anemia, unspecified   . Depression   . Alzheimer's disease   . Gout, unspecified   . Thoracic or lumbosacral neuritis or radiculitis, unspecified   . Progressive supranuclear palsy   . Unspecified transient cerebral ischemia   . Unspecified urinary incontinence   . Syncope and collapse   . Paralysis agitans   . Unspecified transient cerebral ischemia   . Pneumonitis due to inhalation of food or vomitus   . Cellulitis and abscess of oral soft tissues   . Screening for lipoid disorders   . Senile dementia, uncomplicated   . Unspecified constipation   . Disorder of bone and cartilage, unspecified   . Other specified acquired hypothyroidism   . Unspecified hypothyroidism   . Anemia, unspecified   . Depressive disorder, not elsewhere classified   . Alzheimer's disease   . Paralysis agitans   . Pneumonia, organism unspecified   . Pseudomonas infection in conditions classified  elsewhere and of unspecified site   . Unspecified essential hypertension   . Urinary tract infection, site not specified   . Osteoarthrosis, unspecified whether generalized or localized, unspecified site   . Osteoporosis, unspecified   . Gout, unspecified   . Thoracic or lumbosacral neuritis or radiculitis, unspecified   . Stroke     Past Surgical History  Procedure Laterality Date  . Esophagogastroduodenoscopy    . Eye surgery  2006    LEFT CATARACT    Atlee [facility] [place] LDN Clinical Dietitian [phone]

## 2014-03-21 NOTE — Telephone Encounter (Signed)
Patient daughter called and stated that they are not happy. Stated that her father is back in the Hospital and now on 2 antibiotics. Does not understand why we didn't call with the hospital results. I tried to explain to her once the patient is released they are suppose to follow up with their primary Dr. She stated that she knew this and patient had an appointment but had to go back to the hospital before the appointment. Wanted to know why we didn't return their call regarding the call the brother placed about the culture and I told her that the son called and said to disregard the message that they were taking patient to the hospital. She just wants you to know she is dissatisfied. I apoligized and told her once the patient was released to make an appointment so we could follow up with him. She agreed.

## 2014-03-21 NOTE — Progress Notes (Signed)
Pt has aerobic blood culture positive for gram positive rods. MD Tat made aware at [place] via text page. No new orders at this time as pt is receiving broad spectrum IV abx.

## 2014-03-21 NOTE — Care Management Note (Signed)
CARE MANAGEMENT NOTE 03/21/2014  Patient:  Ricky [name], Ricky [name]   Account Number:  [id number]  Date Initiated:  03/21/2014  Documentation initiated by:  Jacon Whetzel  Subjective/Objective Assessment:   80 yo male admitted with AMS. PCP: Dr. Hollace Kinnier.     Action/Plan:   Discharged when medically stable   Anticipated DC Date:     Anticipated DC Plan:        DC Planning Services  CM consult      Choice offered to / List presented to:  NA   DME arranged  NA      DME agency  NA     [place] arranged  HH-1 RN  [place] agency  [place]   Status of service:  In process, will continue to follow Medicare Important Message given?   (If response is "NO", the following Medicare IM given date fields will be blank) Date Medicare IM given:   Date Additional Medicare IM given:    Discharge Disposition:    Per UR Regulation:  Reviewed for med. necessity/level of care/duration of stay  If discussed at [place] of Stay Meetings, dates discussed:    Comments:  03/21/14 1250 Chinmay Squier,MSN,RN [phone] Chart reviewed for utilization of services. PTA pt from home with son as primary cartaker. Pt has [facility] services provided by [employer]. Awaiting PT/OT consult for further dc planning needs.

## 2014-03-21 NOTE — Progress Notes (Signed)
PROGRESS NOTE  Ricky [name] NFA:[id number] DOB: 11-27-1916 DOA: 03/20/2014 PCP: Hollace Kinnier, DO  Interim summary 80 year old male patient with history of Alzheimer's/senile dementia, chronic indwelling Foley catheter, HTN, Parkinsonian syndrome, hypothyroid, gout, progressive supranuclear palsy, lives with his son, presented to the ED after 5 day history of progressive weakness. The patient also developed a new fever with progressive encephalopathy. The patient has had 4 admissions to the hospital to the hospital in the last 4 months for similar problems. He was recently discharged on 03/13/2014 after a mechanical fall and treatment for UTI. He finished ciprofloxacin 2 days prior to this admission. The patient was noted to have a temperature of 101.19F in the emergency department. The patient was noted to have pyuria and a chest x-ray with patchy bibasilar opacities with increased interstitial markings. The patient was started on intravenous antibiotics and IV fluids. There was initial concern for pulmonary edema, and the patient was started on furosemide IV twice a day.   Assessment/Plan: Acute metabolic encephalopathy -Likely due to infectious process Hypoxemia/Pulmonary infiltrates -although there was initial concern for pulmonary edema and pt was started on IV Lasix, pt is clinically dry -d/c Lasix -concerned about aspiration pneumonitis especially with hx of supranuclear palsy -speech therapy eval when pt is more alert -continue vanco pending culture data -continue cefepime -add clinda for concerns of aspiration -daily weights UTI -continue abx pending culture data -pt has indwelling foley due to BPH and urethral injury HTN -continue hydralazine Dementia/Supranuclear Palsy -continue Neupro, Namenda Hx of CVA  continue ASA and statin Severe protein calorie malnutrition -consult nutritionist -nutritional supplements  Family Communication:   Pt at  beside Disposition Plan:   Home when medically stable    Antibiotics:  vanco 03/20/14>>>  Cefepime 03/20/14>>03/21/14  unasyn 03/21/14>>>    Procedures/Studies: Dg Chest 1 View  03/08/2014   CLINICAL DATA:  Fall  EXAM: CHEST - 1 VIEW  COMPARISON:  DG THORACIC SPINE dated 03/08/2014; DG CHEST 2 VIEW dated 01/24/2014  FINDINGS: Stable right upper lobe lung nodules. Lungs are under aerated with scattered volume loss. Cardiomegaly. No pneumothorax.  IMPRESSION: Stable right upper lobe nodules.  Low volumes.   Electronically Signed   By: Maryclare Bean M.D.   On: 03/08/2014 22:58   Dg Cervical Spine 2-3 Views  03/08/2014   CLINICAL DATA:  Fall  EXAM: CERVICAL SPINE - 2-3 VIEW  COMPARISON:  None.  FINDINGS: Only a single lateral cervical spine view is submitted. There is reversed cervical lordosis. There is severe narrowing at virtually every level in the cervical spine. The image is rotated. Detail is obscured by positioning. Prevertebral soft tissues are borderline prominent. This may be due to positioning.  IMPRESSION: Limited exam as described. Injury cannot be excluded by this study. Advanced degenerative changes. Prevertebral soft tissues are borderline prominent.   Electronically Signed   By: Maryclare Bean M.D.   On: 03/08/2014 23:00   Dg Thoracic Spine 2 View  03/08/2014   CLINICAL DATA:  Fall  EXAM: THORACIC SPINE - 2 VIEW  COMPARISON:  DG CHEST 2 VIEW dated 01/24/2014  FINDINGS: Stable L1 compression deformity. Osteopenia. Kyphosis. No definite new compression deformities.  IMPRESSION: Stable L1 compression fracture.   Electronically Signed   By: Maryclare Bean M.D.   On: 03/08/2014 22:56   Dg Lumbar Spine Complete  03/08/2014   CLINICAL DATA:  Fall  EXAM: LUMBAR SPINE - COMPLETE 4+ VIEW  COMPARISON:  DG LUMBAR  SPINE 2-3 VIEWS dated 01/15/2014  FINDINGS: Stable L1 compression fracture. Osteopenia. No new compression deformities. Degenerative changes are noted.  IMPRESSION: No acute bony pathology.  Stable L1  compression fracture.   Electronically Signed   By: Maryclare Bean M.D.   On: 03/08/2014 22:57   Dg Elbow 2 Views Right  03/08/2014   CLINICAL DATA:  Fall  EXAM: RIGHT ELBOW - 2 VIEW  COMPARISON:  None.  FINDINGS: No acute fracture.  No dislocation.  IMPRESSION: No acute bony pathology.   Electronically Signed   By: Maryclare Bean M.D.   On: 03/08/2014 22:56   Dg Chest Port 1 View  03/20/2014   CLINICAL DATA:  r/o pna  EXAM: PORTABLE CHEST - 1 VIEW  COMPARISON:  DG CHEST 1 VIEW dated 03/08/2014  FINDINGS: Low lung volumes. Cardiac silhouette is enlarged. The aorta is tortuous with atherosclerotic calcifications. There is prominence of the interstitial markings without peribronchial cuffing. Linear areas of density appreciated within the lung bases. There is minimal blunting of the costophrenic angles. Degenerative changes appreciated within the shoulders.  IMPRESSION: Pulmonary vascular congestion with a underlying component of chronic bronchitic changes. Scarring versus atelectasis within the lung bases. There also findings likely reflecting a permanent COPD. Degenerative changes within the shoulders.   Electronically Signed   By: Margaree Mackintosh M.D.   On: 03/20/2014 17:34         Subjective: Patient is encephalopathic only intermittently answer yes and no. No respiratory distress, vomiting, diarrhea. Unable to provide review of systems.  Objective: Filed Vitals:   03/20/14 2100 03/20/14 2212 03/21/14 0512 03/21/14 0945  BP: 161/93 143/88 119/56   Pulse: 91 75 67   Temp:  98.5 F (36.9 C) 100.2 F (37.9 C)   TempSrc:  Oral Oral   Resp: 25 21 20    Height:      Weight:      SpO2: 96% 98% 98% 98%    Intake/Output Summary (Last 24 hours) at 03/21/14 1250 Last data filed at 03/21/14 0522  Gross per 24 hour  Intake 465.83 ml  Output   2451 ml  Net -1985.17 ml   Weight change:  Exam:   General:  Pt is alert, does not followcommands appropriately, not in acute distress  HEENT: No icterus,  No thrush, [place]/AT  Cardiovascular: RRR, S1/S2, no rubs, no gallops  Respiratory: Bibasilar rales. No wheezing.  Abdomen: Soft/+BS, non tender, non distended, no guarding  Extremities: No edema, No lymphangitis, No petechiae, No rashes, no synovitis  Data Reviewed: Basic Metabolic Panel:  Recent Labs Lab 03/20/14 1655 03/21/14 0312  NA 136* 137  K 4.6 3.5*  CL 100 99  CO2  --  25  GLUCOSE 110* 107*  BUN 12 9  CREATININE 1.00 0.80  CALCIUM  --  8.9   Liver Function Tests: No results found for this basename: AST, ALT, ALKPHOS, BILITOT, PROT, ALBUMIN,  in the last 168 hours No results found for this basename: LIPASE, AMYLASE,  in the last 168 hours No results found for this basename: AMMONIA,  in the last 168 hours CBC:  Recent Labs Lab 03/20/14 1635 03/20/14 1655 03/21/14 0312  WBC 8.8  --  6.8  NEUTROABS 7.3  --   --   HGB 11.5* 13.6 11.0*  HCT 34.9* 40.0 33.2*  MCV 87.5  --  86.7  PLT 225  --  209   Cardiac Enzymes: No results found for this basename: CKTOTAL, CKMB, CKMBINDEX, TROPONINI,  in the last  168 hours BNP: No components found with this basename: POCBNP,  CBG: No results found for this basename: GLUCAP,  in the last 168 hours  Recent Results (from the past 240 hour(s))  CULTURE, BLOOD (ROUTINE X 2)     Status: None   Collection Time    03/20/14  4:30 PM      Result Value Ref Range Status   Specimen Description BLOOD RIGHT ANTECUBITAL   Final   Special Requests BOTTLES DRAWN AEROBIC ONLY 4ML   Final   Culture  Setup Time     Final   Value: 03/20/2014 21:48     Performed at [facility]   Culture     Final   Value:        BLOOD CULTURE RECEIVED NO GROWTH TO DATE CULTURE WILL BE HELD FOR 5 DAYS BEFORE ISSUING A FINAL NEGATIVE REPORT     Performed at [facility]   Report Status PENDING   Incomplete  CULTURE, BLOOD (ROUTINE X 2)     Status: None   Collection Time    03/20/14  4:35 PM      Result Value Ref Range Status   Specimen  Description BLOOD BLOOD RIGHT FOREARM   Final   Special Requests BOTTLES DRAWN AEROBIC AND ANAEROBIC 3ML   Final   Culture  Setup Time     Final   Value: 03/20/2014 21:48     Performed at [facility]   Culture     Final   Value:        BLOOD CULTURE RECEIVED NO GROWTH TO DATE CULTURE WILL BE HELD FOR 5 DAYS BEFORE ISSUING A FINAL NEGATIVE REPORT     Performed at [facility]   Report Status PENDING   Incomplete  MRSA PCR SCREENING     Status: None   Collection Time    03/20/14 10:01 PM      Result Value Ref Range Status   MRSA by PCR NEGATIVE  NEGATIVE Final   Comment:            The GeneXpert MRSA Assay (FDA     approved for NASAL specimens     only), is one component of a     comprehensive MRSA colonization     surveillance program. It is not     intended to diagnose MRSA     infection nor to guide or     monitor treatment for     MRSA infections.     Scheduled Meds: . acetaminophen  1,000 mg Oral TID  . antiseptic oral rinse  15 mL Mouth Rinse BID  . aspirin EC  325 mg Oral Daily  . ceFEPime (MAXIPIME) IV  1 g Intravenous Q12H  . collagenase  1 application Topical Daily  . enoxaparin (LOVENOX) injection  40 mg Subcutaneous Q24H  . hydrALAZINE  12.5 mg Oral BID  . ipratropium-albuterol  3 mL Nebulization TID  . levothyroxine  25 mcg Oral QAC breakfast  . nystatin cream   Topical BID  . polyethylene glycol  17 g Oral Daily  . rotigotine  1 patch Transdermal Daily  . vancomycin  750 mg Intravenous Q12H   Continuous Infusions: . dextrose 5 % and 0.45% NaCl 50 mL/hr at 03/20/14 2203     Orson Eva, DO  [facility] Pager [phone]  If 7PM-7AM, please contact night-coverage [email or web address] Password [email or web address] 03/21/2014, 12:50 PM   LOS: 1 day

## 2014-03-22 ENCOUNTER — Telehealth: Payer: Self-pay | Admitting: Neurology

## 2014-03-22 ENCOUNTER — Ambulatory Visit: Payer: Medicare HMO | Admitting: Internal Medicine

## 2014-03-22 DIAGNOSIS — E871 Hypo-osmolality and hyponatremia: Secondary | ICD-10-CM | POA: Diagnosis present

## 2014-03-22 DIAGNOSIS — L89109 Pressure ulcer of unspecified part of back, unspecified stage: Secondary | ICD-10-CM

## 2014-03-22 DIAGNOSIS — L8995 Pressure ulcer of unspecified site, unstageable: Secondary | ICD-10-CM

## 2014-03-22 DIAGNOSIS — G231 Progressive supranuclear ophthalmoplegia [Steele-Richardson-Olszewski]: Secondary | ICD-10-CM | POA: Diagnosis present

## 2014-03-22 DIAGNOSIS — E876 Hypokalemia: Secondary | ICD-10-CM | POA: Diagnosis present

## 2014-03-22 DIAGNOSIS — A419 Sepsis, unspecified organism: Secondary | ICD-10-CM | POA: Diagnosis present

## 2014-03-22 DIAGNOSIS — L89150 Pressure ulcer of sacral region, unstageable: Secondary | ICD-10-CM | POA: Diagnosis present

## 2014-03-22 DIAGNOSIS — R1310 Dysphagia, unspecified: Secondary | ICD-10-CM

## 2014-03-22 DIAGNOSIS — G608 Other hereditary and idiopathic neuropathies: Secondary | ICD-10-CM

## 2014-03-22 LAB — BASIC METABOLIC PANEL
BUN: 8 mg/dL (ref 6–23)
CO2: 25 mEq/L (ref 19–32)
Calcium: 8.7 mg/dL (ref 8.4–10.5)
Chloride: 98 mEq/L (ref 96–112)
Creatinine, Ser: 0.79 mg/dL (ref 0.50–1.35)
GFR, EST AFRICAN AMERICAN: 86 mL/min — AB (ref >90)
GFR, EST NON AFRICAN AMERICAN: 75 mL/min — AB (ref >90)
Glucose, Bld: 96 mg/dL (ref 70–99)
POTASSIUM: 3.6 meq/L — AB (ref 3.7–5.3)
SODIUM: 135 meq/L — AB (ref 137–147)

## 2014-03-22 LAB — CBC
HCT: 33 % — ABNORMAL LOW (ref 39.0–52.0)
Hemoglobin: 10.6 g/dL — ABNORMAL LOW (ref 13.0–17.0)
MCH: 28.5 pg (ref 26.0–34.0)
MCHC: 32.1 g/dL (ref 30.0–36.0)
MCV: 88.7 fL (ref 78.0–100.0)
PLATELETS: 181 10*3/uL (ref 150–400)
RBC: 3.72 MIL/uL — ABNORMAL LOW (ref 4.22–5.81)
RDW: 14.2 % (ref 11.5–15.5)
WBC: 5 10*3/uL (ref 4.0–10.5)

## 2014-03-22 LAB — CULTURE, BLOOD (ROUTINE X 2)

## 2014-03-22 MED ORDER — LEVOTHYROXINE SODIUM 100 MCG IV SOLR
12.5000 ug | Freq: Every day | INTRAVENOUS | Status: AC
  Administered 2014-03-22 – 2014-03-23 (×2): 12.5 ug via INTRAVENOUS
  Filled 2014-03-22 (×2): qty 5

## 2014-03-22 MED ORDER — HYDRALAZINE HCL 20 MG/ML IJ SOLN
5.0000 mg | Freq: Three times a day (TID) | INTRAMUSCULAR | Status: DC
  Administered 2014-03-22 – 2014-03-23 (×4): 5 mg via INTRAVENOUS
  Filled 2014-03-22 (×7): qty 0.25

## 2014-03-22 MED ORDER — KCL IN DEXTROSE-NACL 20-5-0.9 MEQ/L-%-% IV SOLN
INTRAVENOUS | Status: DC
  Administered 2014-03-23: 19:00:00 via INTRAVENOUS
  Administered 2014-03-24: 1000 mL via INTRAVENOUS
  Administered 2014-03-25 (×2): via INTRAVENOUS
  Administered 2014-03-26: 1000 mL via INTRAVENOUS
  Administered 2014-03-27: 75 mL/h via INTRAVENOUS
  Administered 2014-03-27 – 2014-03-28 (×2): via INTRAVENOUS
  Filled 2014-03-22 (×12): qty 1000

## 2014-03-22 MED ORDER — FLUCONAZOLE IN SODIUM CHLORIDE 200-0.9 MG/100ML-% IV SOLN
200.0000 mg | INTRAVENOUS | Status: DC
  Administered 2014-03-22: 200 mg via INTRAVENOUS
  Filled 2014-03-22 (×2): qty 100

## 2014-03-22 NOTE — Progress Notes (Addendum)
Progress Note   Ricky [name] QQV:[id number] DOB: [birth year] DOA: 03/20/2014 PCP: Hollace Kinnier, DO   Brief Narrative:   Ricky [name] is an 80 y.o. male with a PMH of Alzheimer's/senile dementia, chronic indwelling Foley catheter, HTN, Parkinsonian syndrome, hypothyroid, gout, progressive supranuclear palsy, lives with his Ricky [name], admitted on 03/20/14 after a 5 day history of progressive weakness. The patient also developed a new fever with progressive encephalopathy. The patient has had 4 admissions to the hospital to the hospital in the last 4 months for similar problems. He was recently discharged on 03/13/2014 after a mechanical fall and treatment for UTI. He finished ciprofloxacin 2 days prior to this admission. The patient was noted to have a temperature of 101.44F in the emergency department. The patient was noted to have pyuria and a chest x-ray with patchy bibasilar opacities with increased interstitial markings. The patient was started on intravenous antibiotics and IV fluids. There was initial concern for pulmonary edema, and the patient was started on furosemide IV twice a day.   Assessment/Plan:   Principal Problem:   Sepsis with fever secondary to UTI versus healthcare associated aspiration pneumonia  Patient met criteria for sepsis with a documented respiratory rate of 21 and a temperature of 101.44F on admission.  The patient was admitted and placed on broad-spectrum antibiotics including cefepime for concerns of recent Pseudomonas UTI, and clindamycin to cover aspiration pneumonia.  Urine cultures grew greater than 100,000 colonies of yeast, started on Diflucan 03/22/14. Indwelling catheter changed 03/20/14.  Recent Pseudomonas positive cultures with only 4 days of Cipro for treatment, so source of sepsis may be from partially treated pseudomonal UTI. Active Problems:   Hypokalemia / hyponatremia  Change IV fluids to normal saline with 20 mEq of potassium per liter at 75  cc/hour.   Sacral decubitus ulcer  Continue Santyl ointment to sacral wound.  Seen by wound care nurse on previous admission 03/10/14. Wound was unstageable.   History of stroke  Continue aspirin. Resume Lipitor if safe when swallowing evaluation completed.   Hypothyroidism  Continue Synthroid. TSH is 0.804 on 03/21/14.   Dysphasia  Speech therapy evaluation pending, currently n.p.o.   Acute metabolic encephalopathy in the setting of dementia and a history of supranuclear palsy  The patient typically becomes encephalopathic from acute infections. He has underlying supranuclear palsy and dementia.  Namenda currently on hold.  Continue Neupro.   Pulmonary edema / hypoxia  Initially given Lasix, the patient appeared to be clinically dry so this was discontinued.  Speech therapy evaluation requested.  Continue clindamycin to cover aspiration pneumonia.   Protein-calorie malnutrition, severe  Pt meets criteria for severe MALNUTRITION in the context of chronic disease as evidenced by severe and moderate muscle wasting and subcutaneous fat loss, 8% weight loss in 3 months, and likely PO intake <75% for > one month.  Seen and evaluated by dietitian 03/21/14. Diet advancement as tolerated.   Hypertension  Continue hydralazine.   DVT Prophylaxis  Continue Lovenox.   Code Status: DNR Family Communication: No family currently at the bedside.  Ricky [name], Ricky [name], updated by telephone. Disposition Plan: Home when stable.   IV Access:    Peripheral IV   Procedures:    None.   Medical Consultants:    None.   Other Consultants:    None.   Anti-Infectives:    Vancomycin 03/20/14--->  Cefepime 03/20/14--->   Diflucan 03/22/14--->  Subjective:   Ricky [name] is verbal but is difficult to understand.  He is without complaint. Denies penile discomfort. No dyspnea. Patient tells me his appetite is good, but currently n.p.o.  Objective:    Filed Vitals:   03/21/14  1943 03/21/14 2013 03/22/14 0434 03/22/14 0910  BP:  138/69 145/71   Pulse:  64 59   Temp:  98.9 F (37.2 C) 98.6 F (37 C)   TempSrc:  Oral Oral   Resp:  18 16   Height:      Weight:      SpO2: 97% 97% 98% 98%    Intake/Output Summary (Last 24 hours) at 03/22/14 1037 Last data filed at 03/22/14 0931  Gross per 24 hour  Intake 835.83 ml  Output   1100 ml  Net -264.17 ml    Exam: Gen:  NAD, slow to respond Cardiovascular:  RRR, No M/R/G Respiratory:  Lungs diminished Gastrointestinal:  Abdomen soft, NT/ND, + BS Genitourinary: Foley catheter in place with purulent drainage around meatus. Under side of shaft of penis with split in the skin, likely from pressure from chronic Foley. Extremities:  Venous stasis changes   Data Reviewed:    Labs: Basic Metabolic Panel:  Recent Labs Lab 03/20/14 1655 03/21/14 0312 03/22/14 0315  NA 136* 137 135*  K 4.6 3.5* 3.6*  CL 100 99 98  CO2  --  25 25  GLUCOSE 110* 107* 96  BUN 12 9 8   CREATININE 1.00 0.80 0.79  CALCIUM  --  8.9 8.7   GFR Estimated Creatinine Clearance: 58.3 ml/min (by C-G formula based on Cr of 0.79).  CBC:  Recent Labs Lab 03/20/14 1635 03/20/14 1655 03/21/14 0312 03/22/14 0315  WBC 8.8  --  6.8 5.0  NEUTROABS 7.3  --   --   --   HGB 11.5* 13.6 11.0* 10.6*  HCT 34.9* 40.0 33.2* 33.0*  MCV 87.5  --  86.7 88.7  PLT 225  --  209 181   BNP (last 3 results)  Recent Labs  03/21/14 0312  PROBNP 3769.0*   Thyroid function studies:  Recent Labs  03/21/14 0312  TSH 0.804   Sepsis Labs:  Recent Labs Lab 03/20/14 1635 03/20/14 1645 03/21/14 0312 03/22/14 0315  WBC 8.8  --  6.8 5.0  LATICACIDVEN  --  1.51  --   --    Microbiology Recent Results (from the past 240 hour(s))  CULTURE, BLOOD (ROUTINE X 2)     Status: None   Collection Time    03/20/14  4:30 PM      Result Value Ref Range Status   Specimen Description BLOOD RIGHT ANTECUBITAL   Final   Special Requests BOTTLES DRAWN  AEROBIC ONLY 4ML   Final   Culture  Setup Time     Final   Value: 03/20/2014 21:48     Performed at [facility]   Culture     Final   Value:        BLOOD CULTURE RECEIVED NO GROWTH TO DATE CULTURE WILL BE HELD FOR 5 DAYS BEFORE ISSUING A FINAL NEGATIVE REPORT     Performed at [facility]   Report Status PENDING   Incomplete  CULTURE, BLOOD (ROUTINE X 2)     Status: None   Collection Time    03/20/14  4:35 PM      Result Value Ref Range Status   Specimen Description BLOOD BLOOD RIGHT FOREARM   Final   Special Requests BOTTLES DRAWN AEROBIC AND ANAEROBIC 3ML   Final   Culture  Setup Time     Final   Value: 03/20/2014 21:48     Performed at [facility]   Culture     Final   Value: DIPHTHEROIDS(CORYNEBACTERIUM SPECIES)     Note: Standardized susceptibility testing for this organism is not available.     Note: Gram Stain Report Called to,Read Back By and Verified With: KATHRYN STRUP ON 03/21/2014 AT 6:47P BY Dennard Nip     Performed at [facility]   Report Status 03/22/2014 FINAL   Final  URINE CULTURE     Status: None   Collection Time    03/20/14  7:41 PM      Result Value Ref Range Status   Specimen Description URINE, CATHETERIZED   Final   Special Requests NONE   Final   Culture  Setup Time     Final   Value: 03/21/2014 00:11     Performed at [place]     Final   Value: >=100,000 COLONIES/ML     Performed at [facility]   Culture     Final   Value: YEAST     Performed at [facility]   Report Status 03/21/2014 FINAL   Final  MRSA PCR SCREENING     Status: None   Collection Time    03/20/14 10:01 PM      Result Value Ref Range Status   MRSA by PCR NEGATIVE  NEGATIVE Final   Comment:            The GeneXpert MRSA Assay (FDA     approved for NASAL specimens     only), is one component of a     comprehensive MRSA colonization     surveillance program. It is not     intended to diagnose MRSA      infection nor to guide or     monitor treatment for     MRSA infections.     Radiographs/Studies:  Dg Chest Port 1 View  03/20/2014   CLINICAL DATA:  r/o pna  EXAM: PORTABLE CHEST - 1 VIEW  COMPARISON:  DG CHEST 1 VIEW dated 03/08/2014  FINDINGS: Low lung volumes. Cardiac silhouette is enlarged. The aorta is tortuous with atherosclerotic calcifications. There is prominence of the interstitial markings without peribronchial cuffing. Linear areas of density appreciated within the lung bases. There is minimal blunting of the costophrenic angles. Degenerative changes appreciated within the shoulders.  IMPRESSION: Pulmonary vascular congestion with a underlying component of chronic bronchitic changes. Scarring versus atelectasis within the lung bases. There also findings likely reflecting a permanent COPD. Degenerative changes within the shoulders.   Electronically Signed   By: Margaree Mackintosh M.D.   On: 03/20/2014 17:34    Medications:   . acetaminophen  1,000 mg Oral TID  . antiseptic oral rinse  15 mL Mouth Rinse BID  . aspirin EC  325 mg Oral Daily  . ceFEPime (MAXIPIME) IV  1 g Intravenous Q12H  . clindamycin (CLEOCIN) IV  600 mg Intravenous 3 times per day  . collagenase  1 application Topical Daily  . enoxaparin (LOVENOX) injection  40 mg Subcutaneous Q24H  . fluconazole (DIFLUCAN) IV  200 mg Intravenous Q24H  . hydrALAZINE  5 mg Intravenous 3 times per day  . ipratropium-albuterol  3 mL Nebulization TID  . levothyroxine  12.5 mcg Intravenous Daily  . nystatin cream   Topical BID  . polyethylene glycol  17 g Oral Daily  .  rotigotine  1 patch Transdermal Daily  . vancomycin  750 mg Intravenous Q12H   Continuous Infusions: . dextrose 5 % and 0.45% NaCl 50 mL/hr at 03/21/14 2204    Time spent: 35 minutes. The patient is medically complex and requires high intensity decision-making.   LOS: 2 days   [place]  [facility] Pager [phone]. If unable to reach me by pager,  please call my cell phone at [phone].  *Please refer to [email or web address], password [email or web address] to get updated schedule on who will round on this patient, as hospitalists switch teams weekly. If 7PM-7AM, please contact night-coverage at [email or web address], password [email or web address] for any overnight needs.  03/22/2014, 10:37 AM    **Disclaimer: This note was dictated with voice recognition software. Similar sounding words can inadvertently be transcribed and this note may contain transcription errors which may not have been corrected upon publication of note.**

## 2014-03-22 NOTE — Evaluation (Signed)
Clinical/Bedside Swallow Evaluation Patient Details  Name: Ricky [name] MRN: [id number] Date of Birth: [birth year]  Today's Date: 03/22/2014 Time: 6301-6010 SLP Time Calculation (min): 20 min  Past Medical History:  Past Medical History  Diagnosis Date  . IBS (irritable bowel syndrome)   . Diverticulosis   . Cataract   . OAB (overactive bladder)   . Macular degeneration   . Alzheimer disease   . Hypertension   . Arthritis   . Osteoporosis   . Parkinsonian syndrome   . Syncope, vasovagal   . Neuromuscular disorder     parkisonian syndrome  . 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 Unspecified transient cerebral ischemia   . Unspecified urinary incontinence   . Senile dementia, uncomplicated   . Unspecified constipation   . Hypothyroidism   . Anemia, unspecified   . Depression   . Alzheimer's disease   . Gout, unspecified   . Thoracic or lumbosacral neuritis or radiculitis, unspecified   . Progressive supranuclear palsy   . Unspecified transient cerebral ischemia   . Unspecified urinary incontinence   . Syncope and collapse   . Paralysis agitans   . Unspecified transient cerebral ischemia   . Pneumonitis due to inhalation of food or vomitus   . Cellulitis and abscess of oral soft tissues   . Screening for lipoid disorders   . Senile dementia, uncomplicated   . Unspecified constipation   . Disorder of bone and cartilage, unspecified   . Other specified acquired hypothyroidism   . Unspecified hypothyroidism   . Anemia, unspecified   . Depressive disorder, not elsewhere classified   . Alzheimer's disease   . Paralysis agitans   . Pneumonia, organism unspecified   . Pseudomonas infection in conditions classified elsewhere and of unspecified site   . Unspecified essential hypertension   . Urinary tract infection, site not specified   . Osteoarthrosis, unspecified whether generalized or localized, unspecified site   . Osteoporosis,  unspecified   . Gout, unspecified   . Thoracic or lumbosacral neuritis or radiculitis, unspecified   . Stroke    Past Surgical History:  Past Surgical History  Procedure Laterality Date  . Esophagogastroduodenoscopy    . Eye surgery  2006    LEFT CATARACT   HPI:  80 yo male adm to [facility] with AMS, fever - found to be encephalopathic.  PMH + for PSP, Parkinson's syndrome, Alzheimer's dementia, chronic catheter and is prone to UTIs.  CXR 5/4 Pulmonary vascular congestion with a underlying component of chronic bronchitic changes, ATX vs scarring at lung bases.  Bedside swallow evaluation ordered.    Assessment / Plan / Recommendation Clinical Impression  Pt presents with mild oropharyngeal dysphagia consistent with dementia, PSP and Parkinsonism.  Delayed oral transiting, likely lingual pumping noted with possible delayed pharygneal swallow initiation.  Pt appeared with adequate airway protection with all po observed  as his voice remained clear.  Multiple swallows noted occasionally across consistencies - suspect due to oral residuals premature spilling.  As pt fully alert and CXR negative for acute event, recommend mechanical soft/ground meats/thin liquid with strict precautions.  Family not present during evaluation and given h/o dysphagia, will follow x1 for family education and pt tolerance.  Thanks for this consult.     Aspiration Risk  Mild    Diet Recommendation Dysphagia 3 (Mechanical Soft);Thin liquid   Liquid Administration via: Cup;Straw Medication Administration: Crushed with puree Supervision: Full supervision/cueing for compensatory strategies;Staff to assist with self feeding  Compensations: Slow rate;Small sips/bites;Check for pocketing (allow time for delayed second swallow) Postural Changes and/or Swallow Maneuvers: Seated upright 90 degrees;Upright 30-60 min after meal    Other  Recommendations Oral Care Recommendations: Oral care BID   Follow Up Recommendations     Suspect none indicated   Frequency and Duration min 1 x/week  1 week   Pertinent Vitals/Pain Afebrile, decreased     Swallow Study Prior Functional Status   see detailed note from family at bedside re: medical maintenance of multiple issues    General Date of Onset: 03/22/14 HPI: 80 yo male adm to [facility] with AMS, fever - found to be encephalopathic.  PMH + for PSP, Parkinson's syndrome, Alzheimer's dementia, chronic catheter and is prone to UTIs.  CXR 5/4 Pulmonary vascular congestion with a underlying component of chronic bronchitic changes, ATX vs scarring at lung bases.  Bedside swallow evaluation ordered.  Type of Study: Bedside swallow evaluation Diet Prior to this Study: NPO Temperature Spikes Noted: No Respiratory Status: Nasal cannula History of Recent Intubation: No Behavior/Cognition: Alert;Doesn't follow directions;Decreased sustained attention;Other (comment) (pt has dementia, decreased ability to follow commands) Oral Cavity - Dentition: Missing dentition;Adequate natural dentition (missing some dentition) Self-Feeding Abilities: Total assist Patient Positioning: Upright in bed Baseline Vocal Quality: Clear Volitional Cough: Cognitively unable to elicit (pt declined to attempt volitional cough) Volitional Swallow: Unable to elicit    Oral/Motor/Sensory Function Overall Oral Motor/Sensory Function:  (generalized weakness, dyskinesia consistent with parkinsonism, PSP) Velum: Within Functional Limits   Ice Chips Ice chips: Impaired Presentation: Spoon Oral Phase Impairments: Reduced lingual movement/coordination;Impaired anterior to posterior transit Oral Phase Functional Implications: Prolonged oral transit Pharyngeal Phase Impairments: Suspected delayed Swallow   Thin Liquid Thin Liquid: Impaired Presentation: Straw;Spoon Oral Phase Impairments: Impaired anterior to posterior transit;Reduced lingual movement/coordination Oral Phase Functional Implications: Prolonged  oral transit Pharyngeal  Phase Impairments: Suspected delayed Swallow;Multiple swallows    Nectar Thick Nectar Thick Liquid: Not tested   Honey Thick Honey Thick Liquid: Not tested   Puree Puree: Impaired Presentation: Spoon Oral Phase Impairments: Reduced lingual movement/coordination;Impaired anterior to posterior transit Oral Phase Functional Implications: Prolonged oral transit Pharyngeal Phase Impairments: Suspected delayed Swallow;Multiple swallows   Solid   GO    Solid: Impaired (moistened graham cracker) Oral Phase Impairments: Reduced lingual movement/coordination;Impaired anterior to posterior transit Oral Phase Functional Implications: Other (comment) (decreased mastication ability, prolonged oral transiting) Pharyngeal Phase Impairments: Suspected delayed Swallow;Multiple swallows       Luanna Salk, [facility] SLP [phone]

## 2014-03-22 NOTE — Telephone Encounter (Signed)
FYI--Patient continues to have UTI's with foley catheter--had to be hospitalized @ [facility] 4-22 through 4-27 and did not get rid of infection while @ hospital--on May 4 patient admitted to [facility] with same problems--on Vancomycin IV now--Room 1305 @ [facility].

## 2014-03-22 NOTE — Telephone Encounter (Signed)
Spoke with Dr.Athar and relayed the message.

## 2014-03-23 ENCOUNTER — Other Ambulatory Visit: Payer: Self-pay | Admitting: Internal Medicine

## 2014-03-23 DIAGNOSIS — E876 Hypokalemia: Secondary | ICD-10-CM

## 2014-03-23 DIAGNOSIS — R627 Adult failure to thrive: Secondary | ICD-10-CM

## 2014-03-23 LAB — BASIC METABOLIC PANEL
BUN: 7 mg/dL (ref 6–23)
CHLORIDE: 103 meq/L (ref 96–112)
CO2: 24 mEq/L (ref 19–32)
CREATININE: 0.7 mg/dL (ref 0.50–1.35)
Calcium: 8.8 mg/dL (ref 8.4–10.5)
GFR calc non Af Amer: 78 mL/min — ABNORMAL LOW (ref >90)
Glucose, Bld: 101 mg/dL — ABNORMAL HIGH (ref 70–99)
Potassium: 3.4 mEq/L — ABNORMAL LOW (ref 3.7–5.3)
SODIUM: 139 meq/L (ref 137–147)

## 2014-03-23 LAB — CBC
HCT: 34.3 % — ABNORMAL LOW (ref 39.0–52.0)
Hemoglobin: 10.8 g/dL — ABNORMAL LOW (ref 13.0–17.0)
MCH: 28 pg (ref 26.0–34.0)
MCHC: 31.5 g/dL (ref 30.0–36.0)
MCV: 88.9 fL (ref 78.0–100.0)
Platelets: 199 10*3/uL (ref 150–400)
RBC: 3.86 MIL/uL — ABNORMAL LOW (ref 4.22–5.81)
RDW: 14.3 % (ref 11.5–15.5)
WBC: 3.9 10*3/uL — AB (ref 4.0–10.5)

## 2014-03-23 MED ORDER — HYDRALAZINE HCL 25 MG PO TABS
12.5000 mg | ORAL_TABLET | Freq: Two times a day (BID) | ORAL | Status: DC
  Administered 2014-03-23 – 2014-03-25 (×5): 12.5 mg via ORAL
  Filled 2014-03-23 (×7): qty 0.5

## 2014-03-23 MED ORDER — POTASSIUM CHLORIDE 10 MEQ/100ML IV SOLN
10.0000 meq | INTRAVENOUS | Status: AC
  Administered 2014-03-23 (×4): 10 meq via INTRAVENOUS
  Filled 2014-03-23 (×4): qty 100

## 2014-03-23 MED ORDER — MEMANTINE HCL 10 MG PO TABS
10.0000 mg | ORAL_TABLET | Freq: Two times a day (BID) | ORAL | Status: DC
  Administered 2014-03-23 – 2014-03-28 (×11): 10 mg via ORAL
  Filled 2014-03-23 (×12): qty 1

## 2014-03-23 MED ORDER — FLUCONAZOLE 200 MG PO TABS
200.0000 mg | ORAL_TABLET | Freq: Every day | ORAL | Status: DC
  Administered 2014-03-23 – 2014-03-28 (×6): 200 mg via ORAL
  Filled 2014-03-23 (×6): qty 1

## 2014-03-23 MED ORDER — RISAQUAD PO CAPS
1.0000 | ORAL_CAPSULE | Freq: Every day | ORAL | Status: DC
  Administered 2014-03-23 – 2014-03-28 (×6): 1 via ORAL
  Filled 2014-03-23 (×6): qty 1

## 2014-03-23 MED ORDER — LEVOTHYROXINE SODIUM 25 MCG PO TABS
25.0000 ug | ORAL_TABLET | Freq: Every day | ORAL | Status: DC
  Administered 2014-03-24 – 2014-03-28 (×5): 25 ug via ORAL
  Filled 2014-03-23 (×6): qty 1

## 2014-03-23 MED ORDER — ATORVASTATIN CALCIUM 10 MG PO TABS
10.0000 mg | ORAL_TABLET | Freq: Every day | ORAL | Status: DC
  Administered 2014-03-23 – 2014-03-26 (×4): 10 mg via ORAL
  Filled 2014-03-23 (×6): qty 1

## 2014-03-23 MED ORDER — BOOST PLUS PO LIQD
237.0000 mL | Freq: Two times a day (BID) | ORAL | Status: DC | PRN
  Filled 2014-03-23: qty 237

## 2014-03-23 NOTE — Telephone Encounter (Signed)
Pt's urine culture had returned prior to his last hospital discharge and he had already been treated appropriately with antibiotics from that episode.  Urine culture was sensitive to the antibiotics he was given.

## 2014-03-23 NOTE — Progress Notes (Signed)
Speech Language Pathology Treatment: Dysphagia  Patient Details Name: Ricky [name] MRN: [id number] DOB: 01-Feb-1917 Today's Date: 03/23/2014 Time: 9611-6435 SLP Time Calculation (min): 8 min  Assessment / Plan / Recommendation Clinical Impression  Pt with good intake (100%) and tolerance, lung sounds remain diminished but clear and pt is afebrile.  SLP administered thin water to pt- mildly delayed swallow initiation but adequate tolerance without airway compromise indication.  Swallow delay up to 7 seconds was inconsistent.  Due to pt's dentition and neuromotor dx, recommend to continue soft/ground meats/thin liquid with strict precautions.    Per documentation in pt's room, family appears to modify diet appropriately to account for pt's fluctuating swallow ability.  SLP to sign off as pt is managing po well.    HPI HPI: 80 yo male adm to [facility] with AMS, fever - found to be encephalopathic.  PMH + for PSP, Parkinson's syndrome, Alzheimer's dementia, chronic catheter and is prone to UTIs.  CXR 5/4 Pulmonary vascular congestion with a underlying component of chronic bronchitic changes, ATX vs scarring at lung bases.  Bedside swallow evaluation completed yesterday with recommendation for dys3/thin diet.  Follow up today to assure tolerance of po given variability of swallow ability.     Pertinent Vitals Afebrile, decreased  SLP Plan  All goals met;Discharge SLP treatment due to (comment)    Recommendations Diet recommendations: Dysphagia 3 (mechanical soft);Thin liquid Medication Administration: Crushed with puree Supervision: Full supervision/cueing for compensatory strategies;Staff to assist with self feeding Compensations: Slow rate;Small sips/bites;Check for pocketing (allow extra time for second swallow, delayed swallow up to 7 seconds) Postural Changes and/or Swallow Maneuvers: Upright 30-60 min after meal;Seated upright 90 degrees              Oral Care Recommendations: Oral care  BID Plan: All goals met;Discharge SLP treatment due to (comment)    [place], [place] [facility] SLP [phone]

## 2014-03-23 NOTE — Progress Notes (Signed)
ANTIBIOTIC CONSULT NOTE - Follow up  Pharmacy Consult for Vancomycin / Cefepime Indication: HCAP  Allergies  Allergen Reactions  . Other Other (See Comments)    Dairy products- cause swallowing difficulty     Patient Measurements: Height: 5\' 10"  (177.8 cm) Weight:  74.8kg IBW/kg (Calculated) : 73  Assessment: Ricky [name] admitted 5/4 from home with AMS, fever, mild hypoxia and cough. PMHx includes progressive supranuclear palsy with dementia and parkinsons with chronic UTIs on chronic foley and recent admission (4/22-4/26) for fall and UTI. Pharmacy consulted to dose vancomycin and cefepime for sepsis 2/2 possible HCAP vs UTI.   Antiinfectives Ceftriaxone 4/22-4/26 inpatient, then outpatient cipro 4/27-5/2 to complete UTI tx 5/4 >> Vancomycin >> 5/4 >> Cefepime >>  5/5 >> clinda (MD) >> 5/6 >> fluconazole (MD) >>  Labs / vitals Tmax: afebrile since admit WBCs: now low, 3.9 Renal: WNL- stable, CrCl 58 ml/min CG, 46 ml/min normalized (using SCr=1.0)  Microbiology Previous admit: 4/22 urine: >100k proteus mirabilis (pansensitive) 4/22 urine: >100k pseudomonas (pansensitive)  This admission: 5/4 blood x1: dipthroids 5/4 blood x1: ngtd 5/4 urine: >100k yeast  Drug levels/Dose changes 5/5: cefepime dose empirically increased to 1g q12h from q24h with what looks like undertreated pseudomonas UTI from previous admit   Goal of Therapy:  Vancomycin trough level 15-20 mcg/ml Eradication of infection  Plan:  - continue cefepime 1g IV q12h - continue vancomycin 750mg  IV q12h - continue clindamycin and fluconazole per MD - will hold off on checking vancomycin trough at this time - will continue to follow renal function, clinical course, and culture results - follow-up length of therapy and antibiotic de-escalation  Thank you for the consult.  Johny Drilling, PharmD, BCPS Pager: [phone] Pharmacy: [phone] 03/23/2014 7:48 AM

## 2014-03-23 NOTE — Telephone Encounter (Signed)
Per Dr. Willy Eddy response needed.

## 2014-03-23 NOTE — Progress Notes (Signed)
Progress Note   Ricky [name] OMA:[id number] DOB: Aug 26, 1917 DOA: 03/20/2014 PCP: Hollace Kinnier, DO   Brief Narrative:   Ricky [name] is an 80 y.o. male with a PMH of Alzheimer's/senile dementia, chronic indwelling Foley catheter, HTN, Parkinsonian syndrome, hypothyroid, gout, progressive supranuclear palsy, lives with his son, admitted on 03/20/14 after a 5 day history of progressive weakness. The patient also developed a new fever with progressive encephalopathy. The patient has had 4 admissions to the hospital to the hospital in the last 4 months for similar problems. He was recently discharged on 03/13/2014 after a mechanical fall and treatment for UTI. He finished ciprofloxacin 2 days prior to this admission. The patient was noted to have a temperature of 101.39F in the emergency department. The patient was noted to have pyuria and a chest x-ray with patchy bibasilar opacities with increased interstitial markings. The patient was started on intravenous antibiotics and IV fluids. There was initial concern for pulmonary edema, and the patient was started on furosemide IV twice a day.   Assessment/Plan:   Principal Problem:   Sepsis with fever secondary to UTI versus healthcare associated aspiration pneumonia  Patient met criteria for sepsis with a documented respiratory rate of 21 and a temperature of 101.39F on admission.  The patient was admitted and placed on broad-spectrum antibiotics including cefepime for concerns of recent Pseudomonas UTI, and clindamycin to cover aspiration pneumonia.  Urine cultures grew greater than 100,000 colonies of yeast, started on Diflucan 03/22/14. Indwelling catheter changed 03/20/14.  Recent Pseudomonas positive cultures with only 4 days of Cipro for treatment, so source of sepsis may be from partially treated pseudomonal UTI. Active Problems:   Hypokalemia / hyponatremia  IV fluids changed to normal saline with 20 mEq of potassium per liter at 75  cc/hour 03/22/14.  Hyponatremia resolved. Will give 4 runs of IV potassium for ongoing hypokalemia.   Sacral decubitus ulcer  Continue Santyl ointment to sacral wound.  Seen by wound care nurse on previous admission 03/10/14. Wound was unstageable.   History of stroke  Continue aspirin. Resume Lipitor.   Hypothyroidism  Continue Synthroid. TSH is 0.804 on 03/21/14.   Dysphasia  Diet advanced to dysphagia 3 on 03/22/14 per speech therapy recommendations.   Acute metabolic encephalopathy in the setting of dementia and a history of supranuclear palsy  The patient typically becomes encephalopathic from acute infections. He has underlying supranuclear palsy and dementia.  Resume Namenda.  Continue Neupro.   Pulmonary edema / hypoxia  Initially given Lasix, the patient appeared to be clinically dry so this was discontinued.  Continue clindamycin to cover aspiration pneumonia.   Protein-calorie malnutrition, severe  Pt meets criteria for severe MALNUTRITION in the context of chronic disease as evidenced by severe and moderate muscle wasting and subcutaneous fat loss, 8% weight loss in 3 months, and likely PO intake <75% for > one month.  Seen and evaluated by dietitian 03/21/14. Diet advanced 03/22/14.   Hypertension  Continue hydralazine.   DVT Prophylaxis  Continue Lovenox.   Code Status: DNR Family Communication: No family currently at the bedside.  Son, [name], updated by telephone 03/22/14. Disposition Plan: Home when stable.   IV Access:    Peripheral IV   Procedures:    None.   Medical Consultants:    None.   Other Consultants:    Speech therapy  Dietitian  Physical therapy  Occupational therapy   Anti-Infectives:    Vancomycin 03/20/14--->  Cefepime 03/20/14--->   Diflucan  03/22/14--->  Subjective:   Ricky [name] is more verbal today, sitting up in chair. He is without complaint. No dyspnea. Nursing staff reports that his appetite is good.  Bowels are moving. No nausea or vomiting.  Objective:    Filed Vitals:   03/22/14 2054 03/22/14 2108 03/22/14 2115 03/23/14 0510  BP: 148/75   163/73  Pulse: 64   62  Temp: 98.8 F (37.1 C)   97.4 F (36.3 C)  TempSrc: Oral   Oral  Resp: 16     Height:      Weight:      SpO2: 99% 98% 99% 97%    Intake/Output Summary (Last 24 hours) at 03/23/14 0725 Last data filed at 03/23/14 0618  Gross per 24 hour  Intake 3678.75 ml  Output   2400 ml  Net 1278.75 ml    Exam: Gen:  NAD, more responsive Cardiovascular:  RRR, No M/R/G Respiratory:  Lungs diminished Gastrointestinal:  Abdomen soft, NT/ND, + BS Extremities:  Venous stasis changes   Data Reviewed:    Labs: Basic Metabolic Panel:  Recent Labs Lab 03/20/14 1655 03/21/14 0312 03/22/14 0315 03/23/14 0350  NA 136* 137 135* 139  K 4.6 3.5* 3.6* 3.4*  CL 100 99 98 103  CO2  --  25 25 24   GLUCOSE 110* 107* 96 101*  BUN 12 9 8 7   CREATININE 1.00 0.80 0.79 0.70  CALCIUM  --  8.9 8.7 8.8   GFR Estimated Creatinine Clearance: 58.3 ml/min (by C-G formula based on Cr of 0.7).  CBC:  Recent Labs Lab 03/20/14 1635 03/20/14 1655 03/21/14 0312 03/22/14 0315 03/23/14 0350  WBC 8.8  --  6.8 5.0 3.9*  NEUTROABS 7.3  --   --   --   --   HGB 11.5* 13.6 11.0* 10.6* 10.8*  HCT 34.9* 40.0 33.2* 33.0* 34.3*  MCV 87.5  --  86.7 88.7 88.9  PLT 225  --  209 181 199   BNP (last 3 results)  Recent Labs  03/21/14 0312  PROBNP 3769.0*   Thyroid function studies:  Recent Labs  03/21/14 0312  TSH 0.804   Sepsis Labs:  Recent Labs Lab 03/20/14 1635 03/20/14 1645 03/21/14 0312 03/22/14 0315 03/23/14 0350  WBC 8.8  --  6.8 5.0 3.9*  LATICACIDVEN  --  1.51  --   --   --    Microbiology Recent Results (from the past 240 hour(s))  CULTURE, BLOOD (ROUTINE X 2)     Status: None   Collection Time    03/20/14  4:30 PM      Result Value Ref Range Status   Specimen Description BLOOD RIGHT ANTECUBITAL   Final    Special Requests BOTTLES DRAWN AEROBIC ONLY 4ML   Final   Culture  Setup Time     Final   Value: 03/20/2014 21:48     Performed at [facility]   Culture     Final   Value:        BLOOD CULTURE RECEIVED NO GROWTH TO DATE CULTURE WILL BE HELD FOR 5 DAYS BEFORE ISSUING A FINAL NEGATIVE REPORT     Performed at [facility]   Report Status PENDING   Incomplete  CULTURE, BLOOD (ROUTINE X 2)     Status: None   Collection Time    03/20/14  4:35 PM      Result Value Ref Range Status   Specimen Description BLOOD BLOOD RIGHT FOREARM   Final  Special Requests BOTTLES DRAWN AEROBIC AND ANAEROBIC 3ML   Final   Culture  Setup Time     Final   Value: 03/20/2014 21:48     Performed at [facility]   Culture     Final   Value: DIPHTHEROIDS(CORYNEBACTERIUM SPECIES)     Note: Standardized susceptibility testing for this organism is not available.     Note: Gram Stain Report Called to,Read Back By and Verified With: KATHRYN STRUP ON 03/21/2014 AT 6:47P BY Dennard Nip     Performed at [facility]   Report Status 03/22/2014 FINAL   Final  URINE CULTURE     Status: None   Collection Time    03/20/14  7:41 PM      Result Value Ref Range Status   Specimen Description URINE, CATHETERIZED   Final   Special Requests NONE   Final   Culture  Setup Time     Final   Value: 03/21/2014 00:11     Performed at [facility]     Final   Value: >=100,000 COLONIES/ML     Performed at [facility]   Culture     Final   Value: YEAST     Performed at [facility]   Report Status 03/21/2014 FINAL   Final  MRSA PCR SCREENING     Status: None   Collection Time    03/20/14 10:01 PM      Result Value Ref Range Status   MRSA by PCR NEGATIVE  NEGATIVE Final   Comment:            The GeneXpert MRSA Assay (FDA     approved for NASAL specimens     only), is one component of a     comprehensive MRSA colonization     surveillance program. It is not      intended to diagnose MRSA     infection nor to guide or     monitor treatment for     MRSA infections.     Radiographs/Studies:  Dg Chest Port 1 View  03/20/2014   CLINICAL DATA:  r/o pna  EXAM: PORTABLE CHEST - 1 VIEW  COMPARISON:  DG CHEST 1 VIEW dated 03/08/2014  FINDINGS: Low lung volumes. Cardiac silhouette is enlarged. The aorta is tortuous with atherosclerotic calcifications. There is prominence of the interstitial markings without peribronchial cuffing. Linear areas of density appreciated within the lung bases. There is minimal blunting of the costophrenic angles. Degenerative changes appreciated within the shoulders.  IMPRESSION: Pulmonary vascular congestion with a underlying component of chronic bronchitic changes. Scarring versus atelectasis within the lung bases. There also findings likely reflecting a permanent COPD. Degenerative changes within the shoulders.   Electronically Signed   By: Margaree Mackintosh M.D.   On: 03/20/2014 17:34    Medications:   . acetaminophen  1,000 mg Oral TID  . antiseptic oral rinse  15 mL Mouth Rinse BID  . aspirin EC  325 mg Oral Daily  . ceFEPime (MAXIPIME) IV  1 g Intravenous Q12H  . clindamycin (CLEOCIN) IV  600 mg Intravenous 3 times per day  . collagenase  1 application Topical Daily  . enoxaparin (LOVENOX) injection  40 mg Subcutaneous Q24H  . fluconazole (DIFLUCAN) IV  200 mg Intravenous Q24H  . hydrALAZINE  5 mg Intravenous 3 times per day  . ipratropium-albuterol  3 mL Nebulization TID  . levothyroxine  12.5 mcg Intravenous Daily  . nystatin  cream   Topical BID  . polyethylene glycol  17 g Oral Daily  . rotigotine  1 patch Transdermal Daily  . vancomycin  750 mg Intravenous Q12H   Continuous Infusions: . dextrose 5 % and 0.9 % NaCl with KCl 20 mEq/L 75 mL/hr at 03/22/14 1159  . dextrose 5 % and 0.45% NaCl 50 mL/hr at 03/22/14 1854    Time spent: 25 minutes.   LOS: 3 days   Mina  [facility] Pager [phone]. If  unable to reach me by pager, please call my cell phone at [phone].  *Please refer to [email or web address], password [email or web address] to get updated schedule on who will round on this patient, as hospitalists switch teams weekly. If 7PM-7AM, please contact night-coverage at [email or web address], password [email or web address] for any overnight needs.  03/23/2014, 7:25 AM    **Disclaimer: This note was dictated with voice recognition software. Similar sounding words can inadvertently be transcribed and this note may contain transcription errors which may not have been corrected upon publication of note.**

## 2014-03-23 NOTE — Progress Notes (Signed)
NUTRITION FOLLOW UP  Intervention:   -Recommend diet texture modification to include all foods be chopped and pureed fruits -Continue feeding assistance -Will continue to monitor -Boost Plus PRN per pt's family request or staff discretion  Nutrition Dx:   Inadequate oral intake related to inability to eat as evidenced by NPO status-improving with diet advancement and feeding assistance   Goal:   Pt to meet >/= 90% of their estimated nutrition needs - progressing  Monitor:   Total protein/energy intake, labs, weights, swallow profile  Assessment:    -Pt nonverbal, unable to attain additional food/nutrition related information  -Per MST screen, pt had been on dysphagia diet (chopped and puree foods) with thickened liquids  -Pt has lost approximately 10-15 lbs since 11/2013  -Currently NPO d/t AMS. Per discussion with RN, pt has been unable to take medications and would unlikely be able to tolerate PO intake. Recommend SLP evaluation as status improves  -Pt had been receiving Boost 90 ml BID during previous admit in 03/09/14 and with 85% PO intake per previous medical records.  -Had also tried [employer] during admit in 05/2013 as Boost increased pt's secretion. Will monitor diet advancement and supplement as warranted/as tolerated  -Muscle wasting and subcutaneous fat loss may also be partially attributed to aging process  5/07: -Diet advanced to Dysphagia 3 per SLP recommendations on 5/06. SLP followed up on 5/7 and confirmed pt's tolerance/swallow function -Discussed pt's PO intake with Nurse Tech. Pt ate well at breakfast, approximately 95% -Pt had some difficulty tolerating tougher/chewier foods (green beans, roll) as they were not chopped. Protein foods ([place], eggs) have been tolerated with addition of gravies. Will add diet comment to ensure all foods be chopped to continue with good PO intake -Nurse tech expressed concern for pt's ability with canned fruits (peaches/pears)  and pt's lack of dentition. Would likely benefit from soft/puree fruits as even chopped peaches may accidentally slip around pt's gum and increase risk of choking -Will order Boost PRN to use per RN discretion or per family request. No family in room during time of RD follow up  Height: Ht Readings from Last 1 Encounters:  03/20/14 5\' 10"  (1.778 m)    Weight Status:   Wt Readings from Last 1 Encounters:  03/13/14 145 lb 15.1 oz (66.2 kg)  03/20/14 165 lbs  Re-estimated needs:  Kcal: 2000-2200  Protein: 95-105  Fluid:>/=2200 ml/daily   Skin: stage 1 sacral ulcer  Diet Order: Dysphagia 3   Intake/Output Summary (Last 24 hours) at 03/23/14 1428 Last data filed at 03/23/14 1340  Gross per 24 hour  Intake 3363.75 ml  Output   2200 ml  Net 1163.75 ml    Last BM: 5/04   Labs:   Recent Labs Lab 03/21/14 0312 03/22/14 0315 03/23/14 0350  NA 137 135* 139  K 3.5* 3.6* 3.4*  CL 99 98 103  CO2 25 25 24   BUN 9 8 7   CREATININE 0.80 0.79 0.70  CALCIUM 8.9 8.7 8.8  GLUCOSE 107* 96 101*    CBG (last 3)  No results found for this basename: GLUCAP,  in the last 72 hours  Scheduled Meds: . acetaminophen  1,000 mg Oral TID  . acidophilus  1 capsule Oral Daily  . antiseptic oral rinse  15 mL Mouth Rinse BID  . aspirin EC  325 mg Oral Daily  . atorvastatin  10 mg Oral q1800  . ceFEPime (MAXIPIME) IV  1 g Intravenous Q12H  . clindamycin (CLEOCIN) IV  600 mg Intravenous 3 times per day  . collagenase  1 application Topical Daily  . enoxaparin (LOVENOX) injection  40 mg Subcutaneous Q24H  . fluconazole  200 mg Oral Daily  . hydrALAZINE  12.5 mg Oral BID  . ipratropium-albuterol  3 mL Nebulization TID  . [START ON 03/24/2014] levothyroxine  25 mcg Oral QAC breakfast  . memantine  10 mg Oral BID  . nystatin cream   Topical BID  . polyethylene glycol  17 g Oral Daily  . rotigotine  1 patch Transdermal Daily  . vancomycin  750 mg Intravenous Q12H    Continuous  Infusions: . dextrose 5 % and 0.9 % NaCl with KCl 20 mEq/L 75 mL/hr at 03/22/14 1159  . dextrose 5 % and 0.45% NaCl 50 mL/hr at 03/22/14 Grant LDN Clinical Dietitian [phone]

## 2014-03-24 DIAGNOSIS — G20 Parkinson's disease: Secondary | ICD-10-CM

## 2014-03-24 DIAGNOSIS — G9341 Metabolic encephalopathy: Secondary | ICD-10-CM

## 2014-03-24 DIAGNOSIS — E43 Unspecified severe protein-calorie malnutrition: Secondary | ICD-10-CM

## 2014-03-24 LAB — BASIC METABOLIC PANEL
BUN: 9 mg/dL (ref 6–23)
CO2: 23 mEq/L (ref 19–32)
Calcium: 8.8 mg/dL (ref 8.4–10.5)
Chloride: 101 mEq/L (ref 96–112)
Creatinine, Ser: 0.81 mg/dL (ref 0.50–1.35)
GFR calc non Af Amer: 74 mL/min — ABNORMAL LOW (ref >90)
GFR, EST AFRICAN AMERICAN: 85 mL/min — AB (ref >90)
Glucose, Bld: 94 mg/dL (ref 70–99)
POTASSIUM: 4.2 meq/L (ref 3.7–5.3)
Sodium: 135 mEq/L — ABNORMAL LOW (ref 137–147)

## 2014-03-24 NOTE — Progress Notes (Signed)
Progress Note   Ricky [name] LYY:[id number] DOB: [birth year] DOA: 03/20/2014 PCP: Hollace Kinnier, DO   Brief Narrative:   Ricky [name] is an 80 y.o. male with a PMH of Alzheimer's/senile dementia, chronic indwelling Foley catheter, HTN, Parkinsonian syndrome, hypothyroid, gout, progressive supranuclear palsy, lives with his son, admitted on 03/20/14 after a 5 day history of progressive weakness. The patient also developed a new fever with progressive encephalopathy. The patient has had 4 admissions to the hospital to the hospital in the last 4 months for similar problems. He was recently discharged on 03/13/2014 after a mechanical fall and treatment for UTI. He finished ciprofloxacin 2 days prior to this admission. The patient was noted to have a temperature of 101.49F in the emergency department. The patient was noted to have pyuria and a chest x-ray with patchy bibasilar opacities with increased interstitial markings. The patient was started on intravenous antibiotics and IV fluids. There was initial concern for pulmonary edema, and the patient was started on furosemide IV twice a day.   Assessment/Plan:   Principal Problem:   Sepsis with fever secondary to UTI versus healthcare associated aspiration pneumonia  Patient met criteria for sepsis with a documented respiratory rate of 21 and a temperature of 101.49F on admission.  The patient was admitted and placed on broad-spectrum antibiotics including cefepime for concerns of recent Pseudomonas UTI, and clindamycin to cover aspiration pneumonia.  Urine cultures grew greater than 100,000 colonies of yeast, started on Diflucan 03/22/14. Indwelling catheter changed 03/20/14.  Recent Pseudomonas positive cultures with only 4 days of Cipro for treatment, so source of sepsis likely from partially treated pseudomonal UTI. Active Problems:   Hypokalemia / hyponatremia  IV fluids changed to normal saline with 20 mEq of potassium per liter at 75  cc/hour 03/22/14.  Hyponatremia improved. Hypokalemia resolved after being given 4 runs of IV potassium 03/23/14.   Sacral decubitus ulcer  Continue Santyl ointment to sacral wound.  Seen by wound care nurse on previous admission 03/10/14. Wound was unstageable.   History of stroke  Continue aspirin and Lipitor.   Hypothyroidism  Continue Synthroid. TSH is 0.804 on 03/21/14.   Dysphasia  Diet advanced to dysphagia 3 on 03/22/14 per speech therapy recommendations.   Acute metabolic encephalopathy in the setting of dementia and a history of supranuclear palsy  The patient typically becomes encephalopathic from acute infections. He has underlying supranuclear palsy and dementia.  Continue Namenda.  Neupro on hold (pharmacy doesn't stock).   Pulmonary edema / hypoxia  Initially given Lasix, the patient appeared to be clinically dry so this was discontinued.  Continue clindamycin to cover aspiration pneumonia.   Protein-calorie malnutrition, severe  Pt met criteria for severe MALNUTRITION in the context of chronic disease as evidenced by severe and moderate muscle wasting and subcutaneous fat loss, 8% weight loss in 3 months, and likely PO intake <75% for > one month.  Seen and evaluated by dietitian 03/21/14. Diet advanced 03/22/14.   Hypertension  Continue hydralazine.   DVT Prophylaxis  Continue Lovenox.   Code Status: DNR Family Communication: No family currently at the bedside.  Son, [name], and daughter updated at bedside. Disposition Plan: Home when stable.   IV Access:    Peripheral IV   Procedures:    None.   Medical Consultants:    None.   Other Consultants:    Speech therapy  Dietitian  Physical therapy  Occupational therapy   Anti-Infectives:    Vancomycin 03/20/14--->  Cefepime 03/20/14--->   Diflucan 03/22/14--->  Subjective:   Ricky [name] is still a bit lethargic and monosyllabic. His son and daughter noticed an improvement since  admission, but he is still not back to his usual baseline status.  Objective:    Filed Vitals:   03/23/14 1423 03/23/14 1929 03/23/14 2243 03/24/14 0445  BP: 149/82  143/77 134/77  Pulse: 75  65 64  Temp: 97.5 F (36.4 C)  97.9 F (36.6 C) 98.3 F (36.8 C)  TempSrc: Oral  Oral Axillary  Resp: 16  16 16   Height:      Weight:      SpO2: 95% 95% 96% 99%    Intake/Output Summary (Last 24 hours) at 03/24/14 0754 Last data filed at 03/24/14 0736  Gross per 24 hour  Intake    720 ml  Output   2425 ml  Net  -1705 ml    Exam: Gen:  NAD, still a bit lethargic Cardiovascular:  RRR, No M/R/G Respiratory:  Lungs diminished Gastrointestinal:  Abdomen soft, NT/ND, + BS Extremities:  Venous stasis changes   Data Reviewed:    Labs: Basic Metabolic Panel:  Recent Labs Lab 03/20/14 1655 03/21/14 0312 03/22/14 0315 03/23/14 0350 03/24/14 0345  NA 136* 137 135* 139 135*  K 4.6 3.5* 3.6* 3.4* 4.2  CL 100 99 98 103 101  CO2  --  25 25 24 23   GLUCOSE 110* 107* 96 101* 94  BUN 12 9 8 7 9   CREATININE 1.00 0.80 0.79 0.70 0.81  CALCIUM  --  8.9 8.7 8.8 8.8   GFR Estimated Creatinine Clearance: 57.6 ml/min (by C-G formula based on Cr of 0.81).  CBC:  Recent Labs Lab 03/20/14 1635 03/20/14 1655 03/21/14 0312 03/22/14 0315 03/23/14 0350  WBC 8.8  --  6.8 5.0 3.9*  NEUTROABS 7.3  --   --   --   --   HGB 11.5* 13.6 11.0* 10.6* 10.8*  HCT 34.9* 40.0 33.2* 33.0* 34.3*  MCV 87.5  --  86.7 88.7 88.9  PLT 225  --  209 181 199   BNP (last 3 results)  Recent Labs  03/21/14 0312  PROBNP 3769.0*   Sepsis Labs:  Recent Labs Lab 03/20/14 1635 03/20/14 1645 03/21/14 0312 03/22/14 0315 03/23/14 0350  WBC 8.8  --  6.8 5.0 3.9*  LATICACIDVEN  --  1.51  --   --   --    Microbiology Recent Results (from the past 240 hour(s))  CULTURE, BLOOD (ROUTINE X 2)     Status: None   Collection Time    03/20/14  4:30 PM      Result Value Ref Range Status   Specimen  Description BLOOD RIGHT ANTECUBITAL   Final   Special Requests BOTTLES DRAWN AEROBIC ONLY 4ML   Final   Culture  Setup Time     Final   Value: 03/20/2014 21:48     Performed at [facility]   Culture     Final   Value:        BLOOD CULTURE RECEIVED NO GROWTH TO DATE CULTURE WILL BE HELD FOR 5 DAYS BEFORE ISSUING A FINAL NEGATIVE REPORT     Performed at [facility]   Report Status PENDING   Incomplete  CULTURE, BLOOD (ROUTINE X 2)     Status: None   Collection Time    03/20/14  4:35 PM      Result Value Ref Range Status   Specimen Description  BLOOD BLOOD RIGHT FOREARM   Final   Special Requests BOTTLES DRAWN AEROBIC AND ANAEROBIC 3ML   Final   Culture  Setup Time     Final   Value: 03/20/2014 21:48     Performed at [facility]   Culture     Final   Value: DIPHTHEROIDS(CORYNEBACTERIUM SPECIES)     Note: Standardized susceptibility testing for this organism is not available.     Note: Gram Stain Report Called to,Read Back By and Verified With: KATHRYN STRUP ON 03/21/2014 AT 6:47P BY Dennard Nip     Performed at [facility]   Report Status 03/22/2014 FINAL   Final  URINE CULTURE     Status: None   Collection Time    03/20/14  7:41 PM      Result Value Ref Range Status   Specimen Description URINE, CATHETERIZED   Final   Special Requests NONE   Final   Culture  Setup Time     Final   Value: 03/21/2014 00:11     Performed at [place]     Final   Value: >=100,000 COLONIES/ML     Performed at [facility]   Culture     Final   Value: YEAST     Performed at [facility]   Report Status 03/21/2014 FINAL   Final  MRSA PCR SCREENING     Status: None   Collection Time    03/20/14 10:01 PM      Result Value Ref Range Status   MRSA by PCR NEGATIVE  NEGATIVE Final   Comment:            The GeneXpert MRSA Assay (FDA     approved for NASAL specimens     only), is one component of a     comprehensive MRSA colonization      surveillance program. It is not     intended to diagnose MRSA     infection nor to guide or     monitor treatment for     MRSA infections.     Radiographs/Studies:  Dg Chest Port 1 View  03/20/2014   CLINICAL DATA:  r/o pna  EXAM: PORTABLE CHEST - 1 VIEW  COMPARISON:  DG CHEST 1 VIEW dated 03/08/2014  FINDINGS: Low lung volumes. Cardiac silhouette is enlarged. The aorta is tortuous with atherosclerotic calcifications. There is prominence of the interstitial markings without peribronchial cuffing. Linear areas of density appreciated within the lung bases. There is minimal blunting of the costophrenic angles. Degenerative changes appreciated within the shoulders.  IMPRESSION: Pulmonary vascular congestion with a underlying component of chronic bronchitic changes. Scarring versus atelectasis within the lung bases. There also findings likely reflecting a permanent COPD. Degenerative changes within the shoulders.   Electronically Signed   By: Margaree Mackintosh M.D.   On: 03/20/2014 17:34    Medications:   . acetaminophen  1,000 mg Oral TID  . acidophilus  1 capsule Oral Daily  . antiseptic oral rinse  15 mL Mouth Rinse BID  . aspirin EC  325 mg Oral Daily  . atorvastatin  10 mg Oral q1800  . ceFEPime (MAXIPIME) IV  1 g Intravenous Q12H  . clindamycin (CLEOCIN) IV  600 mg Intravenous 3 times per day  . collagenase  1 application Topical Daily  . enoxaparin (LOVENOX) injection  40 mg Subcutaneous Q24H  . fluconazole  200 mg Oral Daily  . hydrALAZINE  12.5 mg Oral  BID  . ipratropium-albuterol  3 mL Nebulization TID  . levothyroxine  25 mcg Oral QAC breakfast  . memantine  10 mg Oral BID  . nystatin cream   Topical BID  . polyethylene glycol  17 g Oral Daily  . rotigotine  1 patch Transdermal Daily  . vancomycin  750 mg Intravenous Q12H   Continuous Infusions: . dextrose 5 % and 0.9 % NaCl with KCl 20 mEq/L 75 mL/hr at 03/23/14 1913  . dextrose 5 % and 0.45% NaCl 50 mL/hr at 03/22/14 1854      Time spent: 25 minutes.   LOS: 4 days   [place]  [facility] Pager [phone]. If unable to reach me by pager, please call my cell phone at [phone].  *Please refer to [email or web address], password [email or web address] to get updated schedule on who will round on this patient, as hospitalists switch teams weekly. If 7PM-7AM, please contact night-coverage at [email or web address], password [email or web address] for any overnight needs.  03/24/2014, 7:54 AM    **Disclaimer: This note was dictated with voice recognition software. Similar sounding words can inadvertently be transcribed and this note may contain transcription errors which may not have been corrected upon publication of note.**

## 2014-03-24 NOTE — Progress Notes (Signed)
OT EVALUATION 03/24/14 1200  OT Visit Information  Last OT Received On 03/24/14  Assistance Needed +2  History of Present Illness 80 y.o. male with a history of progressive supranuclear palsy with dementia, hypertension, syncope, hypothyroidism presenting to [facility] ED after sustaining an episode of fall at home;  Pt was adm to [facility] approximately 1 wk ago   Precautions  Precautions Fall  Restrictions  Weight Bearing Restrictions No  Home Living  Family/patient expects to be discharged to: Unsure  Additional Comments from private residence  Prior Function  Comments unsure if pt could participate at all in [place]  Behavior During Therapy Flat affect  Overall Cognitive Status No family/caregiver present to determine baseline cognitive functioning  Upper Extremity Assessment  Upper Extremity Assessment RUE deficits/detail;LUE deficits/detail  RUE Deficits / Details AAROM to 30 degrees; pt resists movement at times.  elbow extension contracted and -80; could not fully extend finger  LUE Deficits / Details AAROM to shoulder 10 degrees; resists movement.  could only get elbow to -60 but he fully extended with PT.  Unable to fully open hand.  Pt did hold washcloth with this  ADL  General ADL Comments uncertain of PLOF.  Pt held washcloth but was total A with washing hands.  Performed with hand over hand assist.  Did not sit up nor roll, total A x 2 with PT and pt did not assist  OT - End of Session  Activity Tolerance Patient limited by lethargy  Patient left in bed;with call bell/phone within reach  OT Assessment  OT Recommendation/Assessment All further OT needs can be met in the next venue of care  OT Problem List Decreased strength;Decreased range of motion;Decreased activity tolerance;Impaired balance (sitting and/or standing);Decreased cognition  OT Therapy Diagnosis  Generalized weakness;Cognitive deficits  OT  Recommendation  Follow Up Recommendations SNF  OT Equipment None recommended by OT  Individuals Consulted  Consulted and Agree with Results and Recommendations Patient unable/family or caregiver not available  Acute Rehab OT Goals  Patient Stated Goal Unable to state  OT Time Calculation  OT Start Time 1148  OT Stop Time 1155  OT Time Calculation (min) 7 min  OT General Charges  $OT Visit 1 Procedure  OT Evaluation  $Initial OT Evaluation Tier I 1 Procedure  Written Expression  Dominant Hand (initiated with L)  Lesle Chris, OTR/L [phone] 03/24/2014

## 2014-03-24 NOTE — Evaluation (Signed)
Physical Therapy Evaluation Patient Details Name: Ricky [name] MRN: [id number] DOB: 1917/03/16 Today's Date: 03/24/2014   History of Present Illness  80 y.o. male with a history of progressive supranuclear palsy with dementia, hypertension, syncope, hypothyroidism presenting to [facility] ED after sustaining an episode of fall at home;  Pt was adm to [facility] approximately 1 wk ago   Clinical Impression  Pt will benefit from trial of PT to address deficits below; recommend SNF due to multiple resent admissions, pt globally weak and unable to care for himself, no family present at time of eval to determine caregiver avail ability    Follow Up Recommendations SNF;Supervision/Assistance - 24 hour    Equipment Recommendations  None recommended by PT    Recommendations for Other Services       Precautions / Restrictions        Mobility  Bed Mobility Overal bed mobility: Needs Assistance;+2 for physical assistance Bed Mobility: Supine to Sit;Sit to Supine Rolling: Total assist;+2 for physical assistance     Sit to supine: Total assist;+2 for physical assistance   General bed mobility comments: pt does not atempt to self assist, he is able to bring trunk forward but only due to rigidity  Transfers                    Ambulation/Gait                Stairs            Wheelchair Mobility    Modified Rankin (Stroke Patients Only)       Balance Overall balance assessment: History of Falls;Needs assistance   Sitting balance-Leahy Scale: Poor Sitting balance - Comments: sat EOB x 12 min; Able to hold independently for short time (~15 seconds). Pt unable to raise head and demonstrated significant forward head and rounded shoulder postures as well as LOB to Left Postural control: Left lateral lean;Posterior lean                                   Pertinent Vitals/Pain No c/o pain    Home Living Family/patient expects to be discharged to:: Private  residence Living Arrangements: Children Available Help at Discharge: Family Type of Home: House Home Access: Ramped entrance     [place]: One [place]: Environmental consultant - 2 wheels;Shower seat;Bedside commode;Wheelchair - manual Additional Comments: unable to obtain history or prior level of function from the patient and no caregiver available. info above continued from most recent visit    Prior Function Level of Independence: Needs assistance   Gait / Transfers Assistance Needed: ?non ambulatory currently? no family present to confirm           Hand Dominance        Extremity/Trunk Assessment   Upper Extremity Assessment: Defer to OT evaluation           Lower Extremity Assessment: RLE deficits/detail;Generalized weakness;LLE deficits/detail RLE Deficits / Details: knee flexion to 10-90*; appears to have very slight knee flexion contractures    Cervical / Trunk Assessment: Kyphotic  Communication      Cognition Arousal/Alertness: Awake/alert Behavior During Therapy: Flat affect Overall Cognitive Status: No family/caregiver present to determine baseline cognitive functioning                 General Comments: pt oriented to place, situation, self; He does not answer some questions and even asks PT "  what does it matter to you?"; delayed response time    General Comments      Exercises        Assessment/Plan    PT Assessment Patient needs continued PT services  PT Diagnosis Generalized weakness   PT Problem List Decreased strength;Decreased range of motion;Decreased activity tolerance;Decreased balance;Decreased mobility;Decreased knowledge of use of DME;Decreased safety awareness  PT Treatment Interventions DME instruction;Gait training;Stair training;Functional mobility training;Therapeutic activities;Therapeutic exercise;Neuromuscular re-education;Patient/family education   PT Goals (Current goals can be found in the Care Plan section) Acute  Rehab PT Goals Patient Stated Goal: Unable to state PT Goal Formulation: Patient unable to participate in goal setting Time For Goal Achievement: 04/07/14 Potential to Achieve Goals: Poor    Frequency Min 2X/week   Barriers to discharge Decreased caregiver support (??)      Co-evaluation               End of Session   Activity Tolerance: Patient limited by fatigue Patient left: in bed;with call bell/phone within reach           Time: 1610-9604 PT Time Calculation (min): 33 min   Charges:   PT Evaluation $Initial PT Evaluation Tier I: 1 Procedure PT Treatments $Therapeutic Activity: 23-37 mins   PT G Codes:          Neil Crouch 03/24/2014, 10:51 AM

## 2014-03-25 DIAGNOSIS — R5383 Other fatigue: Secondary | ICD-10-CM

## 2014-03-25 DIAGNOSIS — R5381 Other malaise: Secondary | ICD-10-CM

## 2014-03-25 LAB — CBC
HCT: 34.4 % — ABNORMAL LOW (ref 39.0–52.0)
Hemoglobin: 11.1 g/dL — ABNORMAL LOW (ref 13.0–17.0)
MCH: 28 pg (ref 26.0–34.0)
MCHC: 32.3 g/dL (ref 30.0–36.0)
MCV: 86.9 fL (ref 78.0–100.0)
PLATELETS: 223 10*3/uL (ref 150–400)
RBC: 3.96 MIL/uL — AB (ref 4.22–5.81)
RDW: 14 % (ref 11.5–15.5)
WBC: 3.4 10*3/uL — ABNORMAL LOW (ref 4.0–10.5)

## 2014-03-25 LAB — BASIC METABOLIC PANEL
BUN: 8 mg/dL (ref 6–23)
CO2: 22 mEq/L (ref 19–32)
Calcium: 9 mg/dL (ref 8.4–10.5)
Chloride: 105 mEq/L (ref 96–112)
Creatinine, Ser: 0.65 mg/dL (ref 0.50–1.35)
GFR calc Af Amer: >90 mL/min (ref >90)
GFR calc non Af Amer: 81 mL/min — ABNORMAL LOW (ref >90)
Glucose, Bld: 92 mg/dL (ref 70–99)
Potassium: 4.2 mEq/L (ref 3.7–5.3)
Sodium: 138 mEq/L (ref 137–147)

## 2014-03-25 LAB — VANCOMYCIN, TROUGH: VANCOMYCIN TR: 14.5 ug/mL (ref 10.0–20.0)

## 2014-03-25 MED ORDER — IPRATROPIUM-ALBUTEROL 0.5-2.5 (3) MG/3ML IN SOLN: 3.0000 mL | Freq: Four times a day (QID) | RESPIRATORY_TRACT | Status: DC | PRN

## 2014-03-25 MED ORDER — HYDRALAZINE HCL 25 MG PO TABS
25.0000 mg | ORAL_TABLET | Freq: Two times a day (BID) | ORAL | Status: DC
  Administered 2014-03-25 – 2014-03-28 (×6): 25 mg via ORAL
  Filled 2014-03-25 (×7): qty 1

## 2014-03-25 NOTE — Progress Notes (Signed)
MD paged regarding bp of 175/81, orders placed by MD increasing hydralazine dosages BID. Will continue to monitor.

## 2014-03-25 NOTE — Progress Notes (Signed)
Progress Note   Ricky [name] IRW:[id number] DOB: 1917/05/30 DOA: 03/20/2014 PCP: Hollace Kinnier, DO   Brief Narrative:   Ricky [name] is an 80 y.o. male with a PMH of Alzheimer's/senile dementia, chronic indwelling Foley catheter, HTN, Parkinsonian syndrome, hypothyroid, gout, progressive supranuclear palsy, lives with his son, admitted on 03/20/14 after a 5 day history of progressive weakness. The patient also developed a new fever with progressive encephalopathy. The patient has had 4 admissions to the hospital to the hospital in the last 4 months for similar problems. He was recently discharged on 03/13/2014 after a mechanical fall and treatment for UTI. He finished ciprofloxacin 2 days prior to this admission. The patient was noted to have a temperature of 101.75F in the emergency department. The patient was noted to have pyuria and a chest x-ray with patchy bibasilar opacities with increased interstitial markings. The patient was started on intravenous antibiotics and IV fluids. There was initial concern for pulmonary edema, and the patient was started on furosemide IV twice a day.   Assessment/Plan:   Principal Problem:   Sepsis with fever secondary to UTI versus healthcare associated aspiration pneumonia  Patient met criteria for sepsis with a documented respiratory rate of 21 and a temperature of 101.75F on admission.  The patient was admitted and placed on broad-spectrum antibiotics including cefepime for concerns of recent Pseudomonas UTI, and clindamycin to cover aspiration pneumonia.  One of 2 blood cultures positive for diptheroids, likely a contaminant falls  Urine cultures grew greater than 100,000 colonies of yeast, started on Diflucan 03/22/14. Indwelling catheter changed 03/20/14.  Recent Pseudomonas positive cultures with only 4 days of Cipro for treatment, so source of sepsis likely from partially treated pseudomonal UTI. Active Problems:   Hypokalemia /  hyponatremia  IV fluids changed to normal saline with 20 mEq of potassium per liter at 75 cc/hour 03/22/14.  Hyponatremia improved. Hypokalemia resolved after being given 4 runs of IV potassium 03/23/14.   Sacral decubitus ulcer  Continue Santyl ointment to sacral wound.  Seen by wound care nurse on previous admission 03/10/14. Wound was unstageable.   History of stroke  Continue aspirin and Lipitor.   Hypothyroidism  Continue Synthroid. TSH is 0.804 on 03/21/14.   Dysphasia  Diet advanced to dysphagia 3 on 03/22/14 per speech therapy recommendations.   Acute metabolic encephalopathy in the setting of dementia and a history of supranuclear palsy  The patient typically becomes encephalopathic from acute infections. He has underlying supranuclear palsy and dementia.  Continue Namenda.  Neupro on hold (pharmacy doesn't stock).  Mental status appears to be approaching usual baseline.   Pulmonary edema / hypoxia  Initially given Lasix, the patient appeared to be clinically dry so this was discontinued.  Continue clindamycin to cover aspiration pneumonia.   Protein-calorie malnutrition, severe  Pt met criteria for severe MALNUTRITION in the context of chronic disease as evidenced by severe and moderate muscle wasting and subcutaneous fat loss, 8% weight loss in 3 months, and likely PO intake <75% for > one month.  Seen and evaluated by dietitian 03/21/14. Diet advanced 03/22/14.   Hypertension  Continue hydralazine.   DVT Prophylaxis  Continue Lovenox.   Code Status: DNR Family Communication: Son, [name], updated by telephone. Disposition Plan: Home when stable, likely 24-48 hours.   IV Access:    Peripheral IV   Procedures:    None.   Medical Consultants:    None.   Other Consultants:    Speech therapy  Dietitian  Physical therapy  Occupational therapy   Anti-Infectives:    Vancomycin 03/20/14--->  Cefepime 03/20/14--->   Diflucan  03/22/14--->  Subjective:   Ricky [name] is more awake and animated. No complaints. Smiling this morning. Nursing staff reports that he could breakfast.  Objective:    Filed Vitals:   03/24/14 0922 03/24/14 1403 03/24/14 2127 03/25/14 0616  BP: 144/62 157/88 178/96 143/86  Pulse:  63 65 79  Temp:  98.6 F (37 C) 98.2 F (36.8 C) 98.3 F (36.8 C)  TempSrc:  Axillary Oral Oral  Resp:  17 16 15   Height:      SpO2:  98% 96% 94%    Intake/Output Summary (Last 24 hours) at 03/25/14 0735 Last data filed at 03/25/14 0617  Gross per 24 hour  Intake 2192.5 ml  Output   3525 ml  Net -1332.5 ml    Exam: Gen:  NAD, more awake and animated Cardiovascular:  RRR, No M/R/G Respiratory:  Lungs diminished Gastrointestinal:  Abdomen soft, NT/ND, + BS Extremities:  Venous stasis changes   Data Reviewed:    Labs: Basic Metabolic Panel:  Recent Labs Lab 03/21/14 0312 03/22/14 0315 03/23/14 0350 03/24/14 0345 03/25/14 0420  NA 137 135* 139 135* 138  K 3.5* 3.6* 3.4* 4.2 4.2  CL 99 98 103 101 105  CO2 25 25 24 23 22   GLUCOSE 107* 96 101* 94 92  BUN 9 8 7 9 8   CREATININE 0.80 0.79 0.70 0.81 0.65  CALCIUM 8.9 8.7 8.8 8.8 9.0   GFR Estimated Creatinine Clearance: 58.3 ml/min (by C-G formula based on Cr of 0.65).  CBC:  Recent Labs Lab 03/20/14 1635 03/20/14 1655 03/21/14 0312 03/22/14 0315 03/23/14 0350 03/25/14 0420  WBC 8.8  --  6.8 5.0 3.9* 3.4*  NEUTROABS 7.3  --   --   --   --   --   HGB 11.5* 13.6 11.0* 10.6* 10.8* 11.1*  HCT 34.9* 40.0 33.2* 33.0* 34.3* 34.4*  MCV 87.5  --  86.7 88.7 88.9 86.9  PLT 225  --  209 181 199 223   BNP (last 3 results)  Recent Labs  03/21/14 0312  PROBNP 3769.0*   Sepsis Labs:  Recent Labs Lab 03/20/14 1645 03/21/14 0312 03/22/14 0315 03/23/14 0350 03/25/14 0420  WBC  --  6.8 5.0 3.9* 3.4*  LATICACIDVEN 1.51  --   --   --   --    Microbiology Recent Results (from the past 240 hour(s))  CULTURE, BLOOD (ROUTINE  X 2)     Status: None   Collection Time    03/20/14  4:30 PM      Result Value Ref Range Status   Specimen Description BLOOD RIGHT ANTECUBITAL   Final   Special Requests BOTTLES DRAWN AEROBIC ONLY 4ML   Final   Culture  Setup Time     Final   Value: 03/20/2014 21:48     Performed at [facility]   Culture     Final   Value:        BLOOD CULTURE RECEIVED NO GROWTH TO DATE CULTURE WILL BE HELD FOR 5 DAYS BEFORE ISSUING A FINAL NEGATIVE REPORT     Performed at [facility]   Report Status PENDING   Incomplete  CULTURE, BLOOD (ROUTINE X 2)     Status: None   Collection Time    03/20/14  4:35 PM      Result Value Ref Range Status  Specimen Description BLOOD BLOOD RIGHT FOREARM   Final   Special Requests BOTTLES DRAWN AEROBIC AND ANAEROBIC 3ML   Final   Culture  Setup Time     Final   Value: 03/20/2014 21:48     Performed at [facility]   Culture     Final   Value: DIPHTHEROIDS(CORYNEBACTERIUM SPECIES)     Note: Standardized susceptibility testing for this organism is not available.     Note: Gram Stain Report Called to,Read Back By and Verified With: KATHRYN STRUP ON 03/21/2014 AT 6:47P BY Dennard Nip     Performed at [facility]   Report Status 03/22/2014 FINAL   Final  URINE CULTURE     Status: None   Collection Time    03/20/14  7:41 PM      Result Value Ref Range Status   Specimen Description URINE, CATHETERIZED   Final   Special Requests NONE   Final   Culture  Setup Time     Final   Value: 03/21/2014 00:11     Performed at [place]     Final   Value: >=100,000 COLONIES/ML     Performed at [facility]   Culture     Final   Value: YEAST     Performed at [facility]   Report Status 03/21/2014 FINAL   Final  MRSA PCR SCREENING     Status: None   Collection Time    03/20/14 10:01 PM      Result Value Ref Range Status   MRSA by PCR NEGATIVE  NEGATIVE Final   Comment:            The GeneXpert MRSA  Assay (FDA     approved for NASAL specimens     only), is one component of a     comprehensive MRSA colonization     surveillance program. It is not     intended to diagnose MRSA     infection nor to guide or     monitor treatment for     MRSA infections.     Radiographs/Studies:  Dg Chest Port 1 View  03/20/2014   CLINICAL DATA:  r/o pna  EXAM: PORTABLE CHEST - 1 VIEW  COMPARISON:  DG CHEST 1 VIEW dated 03/08/2014  FINDINGS: Low lung volumes. Cardiac silhouette is enlarged. The aorta is tortuous with atherosclerotic calcifications. There is prominence of the interstitial markings without peribronchial cuffing. Linear areas of density appreciated within the lung bases. There is minimal blunting of the costophrenic angles. Degenerative changes appreciated within the shoulders.  IMPRESSION: Pulmonary vascular congestion with a underlying component of chronic bronchitic changes. Scarring versus atelectasis within the lung bases. There also findings likely reflecting a permanent COPD. Degenerative changes within the shoulders.   Electronically Signed   By: Margaree Mackintosh M.D.   On: 03/20/2014 17:34    Medications:   . acetaminophen  1,000 mg Oral TID  . acidophilus  1 capsule Oral Daily  . antiseptic oral rinse  15 mL Mouth Rinse BID  . aspirin EC  325 mg Oral Daily  . atorvastatin  10 mg Oral q1800  . ceFEPime (MAXIPIME) IV  1 g Intravenous Q12H  . clindamycin (CLEOCIN) IV  600 mg Intravenous 3 times per day  . collagenase  1 application Topical Daily  . enoxaparin (LOVENOX) injection  40 mg Subcutaneous Q24H  . fluconazole  200 mg Oral Daily  . hydrALAZINE  12.5  mg Oral BID  . ipratropium-albuterol  3 mL Nebulization TID  . levothyroxine  25 mcg Oral QAC breakfast  . memantine  10 mg Oral BID  . nystatin cream   Topical BID  . polyethylene glycol  17 g Oral Daily  . vancomycin  750 mg Intravenous Q12H   Continuous Infusions: . dextrose 5 % and 0.9 % NaCl with KCl 20 mEq/L 75 mL/hr  at 03/25/14 0432  . dextrose 5 % and 0.45% NaCl Stopped (03/24/14 1900)    Time spent: 25 minutes.   LOS: 5 days   [place]  [facility] Pager [phone]. If unable to reach me by pager, please call my cell phone at [phone].  *Please refer to [email or web address], password [email or web address] to get updated schedule on who will round on this patient, as hospitalists switch teams weekly. If 7PM-7AM, please contact night-coverage at [email or web address], password [email or web address] for any overnight needs.  03/25/2014, 7:35 AM    **Disclaimer: This note was dictated with voice recognition software. Similar sounding words can inadvertently be transcribed and this note may contain transcription errors which may not have been corrected upon publication of note.**

## 2014-03-25 NOTE — Progress Notes (Addendum)
ANTIBIOTIC CONSULT NOTE - FOLLOW UP  Pharmacy Consult for Vancomycin/Cefepime Indication: Sepsis/HCAP/UTI  Allergies  Allergen Reactions  . Other Other (See Comments)    Dairy products- cause swallowing difficulty     Patient Measurements: Height: 5\' 10"  (177.8 cm) Weight: 154 lb 4.8 oz (69.99 kg) IBW/kg (Calculated) : 73   Vital Signs: Temp: 98.9 F (37.2 C) (05/10 0457) Temp src: Oral (05/10 0457) BP: 146/88 mmHg (05/10 1051) Pulse Rate: 58 (05/10 0457) Intake/Output from previous day: 05/09 0701 - 05/10 0700 In: 1272.5 [P.O.:520; I.V.:652.5; IV Piggyback:100] Out: 2000 [Urine:2000] Intake/Output from this shift: Total I/O In: 480 [P.O.:480] Out: 1000 [Urine:1000]  Labs:  Recent Labs  03/24/14 0345 03/25/14 0420  WBC  --  3.4*  HGB  --  11.1*  PLT  --  223  CREATININE 0.81 0.65   Estimated Creatinine Clearance: 55.9 ml/min (by C-G formula based on Cr of 0.65).  Recent Labs  03/25/14 1638  VANCOTROUGH 14.5     Microbiology: Recent Results (from the past 720 hour(s))  URINE CULTURE     Status: None   Collection Time    03/08/14 11:00 PM      Result Value Ref Range Status   Specimen Description URINE, CATHETERIZED   Final   Special Requests NONE   Final   Culture  Setup Time     Final   Value: 03/09/2014 00:59     Performed at [place]     Final   Value: >=100,000 COLONIES/ML     Performed at [facility]   Culture     Final   Value: PROTEUS MIRABILIS     PSEUDOMONAS AERUGINOSA     Performed at [facility]   Report Status 03/13/2014 FINAL   Final   Organism ID, Bacteria PROTEUS MIRABILIS   Final   Organism ID, Bacteria PSEUDOMONAS AERUGINOSA   Final  MRSA PCR SCREENING     Status: None   Collection Time    03/09/14  2:49 AM      Result Value Ref Range Status   MRSA by PCR NEGATIVE  NEGATIVE Final   Comment:            The GeneXpert MRSA Assay (FDA     approved for NASAL specimens     only), is  one component of a     comprehensive MRSA colonization     surveillance program. It is not     intended to diagnose MRSA     infection nor to guide or     monitor treatment for     MRSA infections.  CULTURE, BLOOD (ROUTINE X 2)     Status: None   Collection Time    03/20/14  4:30 PM      Result Value Ref Range Status   Specimen Description BLOOD RIGHT ANTECUBITAL   Final   Special Requests BOTTLES DRAWN AEROBIC ONLY 4ML   Final   Culture  Setup Time     Final   Value: 03/20/2014 21:48     Performed at [facility]   Culture     Final   Value: NO GROWTH 5 DAYS     Performed at [facility]   Report Status 03/26/2014 FINAL   Final  CULTURE, BLOOD (ROUTINE X 2)     Status: None   Collection Time    03/20/14  4:35 PM      Result Value Ref Range Status  Specimen Description BLOOD BLOOD RIGHT FOREARM   Final   Special Requests BOTTLES DRAWN AEROBIC AND ANAEROBIC 3ML   Final   Culture  Setup Time     Final   Value: 03/20/2014 21:48     Performed at [facility]   Culture     Final   Value: DIPHTHEROIDS(CORYNEBACTERIUM SPECIES)     Note: Standardized susceptibility testing for this organism is not available.     Note: Gram Stain Report Called to,Read Back By and Verified With: KATHRYN STRUP ON 03/21/2014 AT 6:47P BY Dennard Nip     Performed at [facility]   Report Status 03/22/2014 FINAL   Final  URINE CULTURE     Status: None   Collection Time    03/20/14  7:41 PM      Result Value Ref Range Status   Specimen Description URINE, CATHETERIZED   Final   Special Requests NONE   Final   Culture  Setup Time     Final   Value: 03/21/2014 00:11     Performed at [place]     Final   Value: >=100,000 COLONIES/ML     Performed at [facility]   Culture     Final   Value: YEAST     Performed at [facility]   Report Status 03/21/2014 FINAL   Final  MRSA PCR SCREENING     Status: None   Collection Time    03/20/14  10:01 PM      Result Value Ref Range Status   MRSA by PCR NEGATIVE  NEGATIVE Final   Comment:            The GeneXpert MRSA Assay (FDA     approved for NASAL specimens     only), is one component of a     comprehensive MRSA colonization     surveillance program. It is not     intended to diagnose MRSA     infection nor to guide or     monitor treatment for     MRSA infections.    Anti-infectives   Start     Dose/Rate Route Frequency Ordered Stop   03/23/14 1000  fluconazole (DIFLUCAN) tablet 200 mg     200 mg Oral Daily 03/23/14 0959     03/22/14 1200  fluconazole (DIFLUCAN) IVPB 200 mg  Status:  Discontinued     200 mg 100 mL/hr over 60 Minutes Intravenous Every 24 hours 03/22/14 0914 03/23/14 0959   03/21/14 1700  ceFEPIme (MAXIPIME) 1 g in dextrose 5 % 50 mL IVPB  Status:  Discontinued     1 g 100 mL/hr over 30 Minutes Intravenous Every 24 hours 03/20/14 1709 03/21/14 0834   03/21/14 1500  clindamycin (CLEOCIN) IVPB 600 mg     600 mg 100 mL/hr over 30 Minutes Intravenous 3 times per day 03/21/14 1326 03/27/14 0559   03/21/14 1000  ceFEPIme (MAXIPIME) 1 g in dextrose 5 % 50 mL IVPB     1 g 100 mL/hr over 30 Minutes Intravenous Every 12 hours 03/21/14 0834 03/27/14 0959   03/21/14 0500  vancomycin (VANCOCIN) IVPB 750 mg/150 ml premix     750 mg 150 mL/hr over 60 Minutes Intravenous Every 12 hours 03/20/14 1709 03/27/14 0459   03/20/14 2200  ceFEPIme (MAXIPIME) 1 g in dextrose 5 % 50 mL IVPB  Status:  Discontinued     1 g 100 mL/hr over 30 Minutes  Intravenous Every 12 hours 03/20/14 2148 03/20/14 2154   03/20/14 1715  vancomycin (VANCOCIN) 1,500 mg in sodium chloride 0.9 % 500 mL IVPB     1,500 mg 250 mL/hr over 120 Minutes Intravenous STAT 03/20/14 1708 03/20/14 2035   03/20/14 1645  ceFEPIme (MAXIPIME) 2 g in dextrose 5 % 50 mL IVPB     2 g 100 mL/hr over 30 Minutes Intravenous  Once 03/20/14 1638 03/20/14 1759   03/20/14 1645  vancomycin (VANCOCIN) IVPB 1000 mg/200 mL  premix  Status:  Discontinued     1,000 mg 200 mL/hr over 60 Minutes Intravenous  Once 03/20/14 1638 03/20/14 1940      Assessment: 80 yo F on D#7 Vancomycin, cefepime, D#5 Clindamycin for sepsis w/ fever secondary to UTI vs. HCAP.   Renal function remains stable with estimated CrCl 56 ml/min.  Vancomycin trough (14.5) is nearly therapeutic on 750 mg IV q12h.  AF, WBC low.    5/4 >> Vancomycin >> (Stop Date 5/11) 5/4 >> Cefepime >>  (Stop date 5/11)  5/5 >> clinda (MD) >> 5/6 >> fluconazole (MD) >>   Previous admit: 4/22 urine: >100k proteus mirabilis (pansensitive) 4/22 urine: >100k pseudomonas (pansensitive)  This admission: 5/4 blood x1: dipthroids 5/4 blood x1: ngtd 5/4 urine: >100k yeast  Goal of Therapy:  Vancomycin trough level 15-20 mcg/ml Cefepime per renal function   Plan:  1.) Continue current antibiotics regimen through 5/11 per MD.  Stop time in place  [place] 03/26/2014,1:06 PM

## 2014-03-26 LAB — CULTURE, BLOOD (ROUTINE X 2): Culture: NO GROWTH

## 2014-03-26 MED ORDER — FLUCONAZOLE 200 MG PO TABS: 200.0000 mg | ORAL_TABLET | Freq: Every day | ORAL | Status: DC

## 2014-03-26 NOTE — Discharge Instructions (Signed)
Aspiration Precautions °Aspiration is the inhaling of a liquid or object into the lungs. Things that can be inhaled into the lungs include: °· Food. °· Any type of liquid, such as drinks or saliva. °· Stomach contents, such as vomit or stomach acid. °When these things go into the lungs, damage can occur. Serious complications can then result, such as: °· A lung infection (pneumonia). °· A collection of pus in the lungs (lung abscess). °CAUSES °· A decreased level of awareness (consciousness) due to: °· Traumatic brain injury or head injury. °· Stroke. °· Neurological disease. °· Seizures. °· Decreased or absent gag reflex (inability to cough). °· Medical conditions that affect swallowing. °· Conditions that affect the food pipe (esophagus) such as a narrowing of the esophagus (esophageal stricture). °· Gastroesophageal reflux (GERD). This is also known as acid reflux. °· Any type of surgery where you are put under general anesthesia or have sedation. °· Drinking large amounts of alcohol. °· Taking medication that causes drowsiness, confusion, or weakness. °· Aging. °· Dental problems. °· Having a feeding tube. °SYMPTOMS °When aspiration occurs, different signs and symptoms can occur, such as: °· Coughing (if a person has a cough or gag reflex) after swallowing food or liquids. °· Difficulty breathing. This can include things like: °· Breathing rapidly. °· Breathing very slowly. °· Hearing "gurgling" lung sounds when a person breaths. °· Coughing up phlegm (sputum) that is: °· Yellow, tan, or green in color. °· Has pieces of food in it. °· Bad smelling. °· A change in voice (hoarseness). °· A change in skin color. The skin may turn a "bluish" type color because of a lack of oxygen (cyanosis). °· Fever. °DIAGNOSIS °· A chest X-ray may be performed. This takes a picture of your lungs. It can show changes in the lungs if aspiration has occurred. °· A bronchoscopy may be performed. This is a surgical procedure in which a  thin, flexible tube with a camera at the end is inserted into the nose or mouth. The tube is advanced to the lungs so your caregiver can view the lungs and obtain a culture, tissue sample, or remove an aspirated object. °· A swallowing evaluation study may be performed to evaluate: °· A person's risk of aspiration. °· How difficult it is for a person to swallow. °· What types of foods are safe for a person to eat. °PREVENTION °If you are a caregiver to someone who may aspirate, follow the directions below. °If you are caring for someone who can eat and drink through their mouth: °· Have them sit in an upright position when eating food or drinking fluids, such as: °· Sitting up in a chair. °· If sitting in a chair is not possible, position the person in bed so they are upright. °· Remind the person to eat slowly and chew well. °· Do not distract the person. This is especially important for people with thinking or memory (cognitive) problems. °· Check the person's mouth for leftover food after eating. °· Keep the person sitting upright for 30 to 45 minutes after eating. °· Do not serve food or drink for at least 2 hours before bedtime. °If you are caring for someone with a feeding tube and he or she cannot eat or drink through their mouth: °· Keep the person in an upright position as much as possible. °· Do not  lay the person flat if they are getting continuous feedings. Turn the feeding pump off if you need to lay the   person flat for any reason. °· Check feeding tube residuals as directed by your caregiver. If a large amount of tube feedings are pulled back (aspirated) from the feeding tube, call your caregiver right away. °General guidelines to prevent aspiration include: °· Feed small amounts of food. Do not force feed. °· Use as little water as possible when brushing the person's teeth or cleaning his or her mouth. °· Provide oral care before and after meals. °· Never put food or fluids in the mouth of a person  who is not fully alert. °· Crush pills and put them in soft food such as pudding or ice cream. Some pills should not be crushed. Check with your caregiver before crushing any medication. °SEEK IMMEDIATE MEDICAL CARE IF:  °· The person has trouble breathing or starts to breathe rapidly. °· The person is breathing very slowly or stops breathing. °· The person coughs a lot after eating or drinking. °· The person has a chronic cough. °· The person coughs up thick, yellow, or tan sputum. °· The person has a fever or persistent symptoms for more than 72 hours. °· The person has a fever and their symptoms suddenly get worse. °Document Released: 12/06/2010 Document Revised: 01/26/2012 Document Reviewed: 12/06/2010 °ExitCare® Patient Information ©2014 [employer]. ° °Aspiration Pneumonia °Aspiration pneumonia is an infection in your lungs. It occurs when food, liquid, or stomach contents (vomit) are inhaled (aspirated) into your lungs. When these things get into your lungs, swelling (inflammation) and infection can occur. This can make it difficult for you to breathe. Aspiration pneumonia is a serious condition and can be life threatening. °RISK FACTORS °Aspiration pneumonia is more likely to occur when a person's cough (gag) reflex or ability to swallow has been decreased. Some things that can do this include:  °· Having a brain injury or disease, such as stroke, seizures, Parkinson disease, dementia, or amyotrophic lateral sclerosis (ALS).   °· Being given general anesthetic for procedures.   °· Being in a coma (unconscious).   °· Having a narrowing of the tube that carries food to the stomach (esophagus).   °· Drinking too much alcohol. If a person passes out and vomits, vomit can be swallowed into the lungs.   °· Taking certain medicines, such as tranquilizers or sedatives.   °SIGNS AND SYMPTOMS  °· Coughing after swallowing food or liquids.   °· Breathing problems, such as wheezing or shortness of breath.   °· Bluish  skin. This can be caused by lack of oxygen.   °· Coughing up food or mucus. The mucus might contain blood, greenish material, or yellowish-white fluid (pus).   °· Fever.   °· Chest pain.   °· Being more tired than usual (fatigue).   °· Sweating more than usual.   °· Bad breath.   °DIAGNOSIS  °A physical exam will be done. During the exam, the health care provider will listen to your lungs with a stethoscope to check for:  °· Crackling sounds in the lungs. °· Decreased breath sounds. °· A rapid heartbeat. °Various tests may be ordered. These may include:  °· Chest X-ray.   °· CT scan.   °· Swallowing study. This test looks at how food is swallowed and whether it goes into your breathing tube (trachea) or food pipe (esophagus).   °· Sputum culture. Saliva and mucus (sputum) are collected from the lungs or the tubes that carry air to the lungs (bronchi). The sputum is then tested for bacteria.   °· Bronchoscopy. This test uses a flexible tube (bronchoscope) to see inside the lungs. °TREATMENT  °Treatment will usually include   antibiotic medicines. Other medicines may also be used to reduce fever or pain. You may need to be treated in the hospital. In the hospital, your breathing will be carefully monitored. Depending on how well you are breathing, you may need to be given oxygen, or you may need breathing support from a breathing machine (ventilator). For people who fail a swallowing study, a feeding tube might be placed in the stomach, or they may be asked to avoid certain food textures or liquids when they eat. °HOME CARE INSTRUCTIONS  °· Carefully follow any special eating instructions you were given, such as avoiding certain food textures or thickening liquids. This reduces the risk of developing aspiration pneumonia again.  °· Only take over-the-counter or prescription medicines as directed by your health care provider. Follow the directions carefully.   °· If you were prescribed antibiotics, take them as directed.  Finish them even if you start to feel better.   °· Rest as instructed by your health care provider.   °· Keep all follow-up appointments with your health care provider.   °SEEK MEDICAL CARE IF:  °· You develop worsening shortness of breath, wheezing, or difficulty breathing.   °· You develop a fever.   °· You have chest pain.   °MAKE SURE YOU:  °· Understand these instructions. °· Will watch your condition. °· Will get help right away if you are not doing well or get worse. °Document Released: 08/31/2009 Document Revised: 07/06/2013 Document Reviewed: 04/21/2013 °ExitCare® Patient Information ©2014 [employer]. ° °

## 2014-03-26 NOTE — Plan of Care (Signed)
Problem: Phase II Progression Outcomes Goal: Progress activity as tolerated unless otherwise ordered Outcome: Progressing Sitting up in chair- Sara lift used

## 2014-03-26 NOTE — Progress Notes (Signed)
Pt was moved with a Ricky [name] lift. He sat up in the chair for 6 1/2 hours today. Tolerated well. Family in to visit.  Appetite good, needs to be fed, pt's arms contracted.

## 2014-03-26 NOTE — Progress Notes (Signed)
Progress Note   Ricky [name] OIT:[id number] DOB: May 21, 1917 DOA: 03/20/2014 PCP: Hollace Kinnier, DO   Brief Narrative:   Ricky [name] is an 80 y.o. male with a PMH of Alzheimer's/senile dementia, chronic indwelling Foley catheter, HTN, Parkinsonian syndrome, hypothyroid, gout, progressive supranuclear palsy, lives with his son, admitted on 03/20/14 after a 5 day history of progressive weakness. The patient also developed a new fever with progressive encephalopathy. The patient has had 4 admissions to the hospital to the hospital in the last 4 months for similar problems. He was recently discharged on 03/13/2014 after a mechanical fall and treatment for UTI. He finished ciprofloxacin 2 days prior to this admission. The patient was noted to have a temperature of 101.26F in the emergency department. The patient was noted to have pyuria and a chest x-ray with patchy bibasilar opacities with increased interstitial markings. The patient was started on intravenous antibiotics and IV fluids. There was initial concern for pulmonary edema, and the patient was started on furosemide IV twice a day.   Assessment/Plan:   Principal Problem:   Sepsis with fever secondary to UTI versus healthcare associated aspiration pneumonia  Patient met criteria for sepsis with a documented respiratory rate of 21 and a temperature of 101.26F on admission.  The patient was admitted and placed on broad-spectrum antibiotics including cefepime for concerns of recent Pseudomonas UTI, and clindamycin to cover aspiration pneumonia. Continue antibiotics through today, then discontinue.  One of 2 blood cultures positive for diptheroids, likely a contaminant.  Urine cultures grew greater than 100,000 colonies of yeast, started on Diflucan 03/22/14. Indwelling catheter changed 03/20/14.  Recent Pseudomonas positive cultures with only 4 days of Cipro for treatment, so source of sepsis likely from partially treated pseudomonal  UTI. Active Problems:   Hypokalemia / hyponatremia  IV fluids changed to normal saline with 20 mEq of potassium per liter at 75 cc/hour 03/22/14.  Hyponatremia improved. Hypokalemia resolved after being given 4 runs of IV potassium 03/23/14.   Sacral decubitus ulcer  Continue Santyl ointment to sacral wound.  Seen by wound care nurse on previous admission 03/10/14. Wound was unstageable.   History of stroke  Continue aspirin and Lipitor.   Hypothyroidism  Continue Synthroid. TSH is 0.804 on 03/21/14.   Dysphasia  Diet advanced to dysphagia 3 on 03/22/14 per speech therapy recommendations.   Acute metabolic encephalopathy in the setting of dementia and a history of supranuclear palsy  The patient typically becomes encephalopathic from acute infections. He has underlying supranuclear palsy and dementia.  Continue Namenda.  Neupro on hold (pharmacy doesn't stock).  Mental status appears to be approaching usual baseline.   Pulmonary edema / hypoxia  Initially given Lasix, the patient appeared to be clinically dry so this was discontinued.  Continue clindamycin to cover aspiration pneumonia.   Protein-calorie malnutrition, severe  Pt met criteria for severe MALNUTRITION in the context of chronic disease as evidenced by severe and moderate muscle wasting and subcutaneous fat loss, 8% weight loss in 3 months, and likely PO intake <75% for > one month.  Seen and evaluated by dietitian 03/21/14. Diet advanced 03/22/14.   Hypertension  Continue hydralazine.   DVT Prophylaxis  Continue Lovenox.   Code Status: DNR Family Communication: Son, Ricky [name], updated by telephone. Disposition Plan: Home when stable, likely 24-48 hours.   IV Access:    Peripheral IV   Procedures:    None.   Medical Consultants:    None.   Other Consultants:  Speech therapy  Dietitian  Physical therapy  Occupational therapy   Anti-Infectives:    Vancomycin  03/20/14--->03/26/14  Clindamycin 03/20/49--->03/26/14  Cefepime 03/20/14---> 03/26/14  Diflucan 03/22/14--->  Subjective:   Ricky [name] remains awake and animated. No complaints. Eating well.  Objective:    Filed Vitals:   03/25/14 1340 03/25/14 1937 03/25/14 2108 03/26/14 0457  BP: 175/81  151/78 165/84  Pulse: 62  69 58  Temp: 98.2 F (36.8 C)  99.1 F (37.3 C) 98.9 F (37.2 C)  TempSrc: Axillary  Oral Oral  Resp: 18  14 16   Height:      Weight:    69.99 kg (154 lb 4.8 oz)  SpO2: 99% 92% 97% 97%    Intake/Output Summary (Last 24 hours) at 03/26/14 0744 Last data filed at 03/26/14 0459  Gross per 24 hour  Intake 1272.5 ml  Output   2000 ml  Net -727.5 ml    Exam: Gen:  NAD, remains awake and animated Cardiovascular:  RRR, No M/R/G Respiratory:  Lungs diminished Gastrointestinal:  Abdomen soft, NT/ND, + BS Extremities:  Venous stasis changes   Data Reviewed:    Labs: Basic Metabolic Panel:  Recent Labs Lab 03/21/14 0312 03/22/14 0315 03/23/14 0350 03/24/14 0345 03/25/14 0420  NA 137 135* 139 135* 138  K 3.5* 3.6* 3.4* 4.2 4.2  CL 99 98 103 101 105  CO2 25 25 24 23 22   GLUCOSE 107* 96 101* 94 92  BUN 9 8 7 9 8   CREATININE 0.80 0.79 0.70 0.81 0.65  CALCIUM 8.9 8.7 8.8 8.8 9.0   GFR Estimated Creatinine Clearance: 55.9 ml/min (by C-G formula based on Cr of 0.65).  CBC:  Recent Labs Lab 03/20/14 1635 03/20/14 1655 03/21/14 0312 03/22/14 0315 03/23/14 0350 03/25/14 0420  WBC 8.8  --  6.8 5.0 3.9* 3.4*  NEUTROABS 7.3  --   --   --   --   --   HGB 11.5* 13.6 11.0* 10.6* 10.8* 11.1*  HCT 34.9* 40.0 33.2* 33.0* 34.3* 34.4*  MCV 87.5  --  86.7 88.7 88.9 86.9  PLT 225  --  209 181 199 223   BNP (last 3 results)  Recent Labs  03/21/14 0312  PROBNP 3769.0*   Sepsis Labs:  Recent Labs Lab 03/20/14 1645 03/21/14 0312 03/22/14 0315 03/23/14 0350 03/25/14 0420  WBC  --  6.8 5.0 3.9* 3.4*  LATICACIDVEN 1.51  --   --   --   --     Microbiology Recent Results (from the past 240 hour(s))  CULTURE, BLOOD (ROUTINE X 2)     Status: None   Collection Time    03/20/14  4:30 PM      Result Value Ref Range Status   Specimen Description BLOOD RIGHT ANTECUBITAL   Final   Special Requests BOTTLES DRAWN AEROBIC ONLY 4ML   Final   Culture  Setup Time     Final   Value: 03/20/2014 21:48     Performed at [facility]   Culture     Final   Value:        BLOOD CULTURE RECEIVED NO GROWTH TO DATE CULTURE WILL BE HELD FOR 5 DAYS BEFORE ISSUING A FINAL NEGATIVE REPORT     Performed at [facility]   Report Status PENDING   Incomplete  CULTURE, BLOOD (ROUTINE X 2)     Status: None   Collection Time    03/20/14  4:35 PM  Result Value Ref Range Status   Specimen Description BLOOD BLOOD RIGHT FOREARM   Final   Special Requests BOTTLES DRAWN AEROBIC AND ANAEROBIC 3ML   Final   Culture  Setup Time     Final   Value: 03/20/2014 21:48     Performed at [facility]   Culture     Final   Value: DIPHTHEROIDS(CORYNEBACTERIUM SPECIES)     Note: Standardized susceptibility testing for this organism is not available.     Note: Gram Stain Report Called to,Read Back By and Verified With: KATHRYN STRUP ON 03/21/2014 AT 6:47P BY Dennard Nip     Performed at [facility]   Report Status 03/22/2014 FINAL   Final  URINE CULTURE     Status: None   Collection Time    03/20/14  7:41 PM      Result Value Ref Range Status   Specimen Description URINE, CATHETERIZED   Final   Special Requests NONE   Final   Culture  Setup Time     Final   Value: 03/21/2014 00:11     Performed at [facility]     Final   Value: >=100,000 COLONIES/ML     Performed at [facility]   Culture     Final   Value: YEAST     Performed at [facility]   Report Status 03/21/2014 FINAL   Final  MRSA PCR SCREENING     Status: None   Collection Time    03/20/14 10:01 PM      Result Value Ref Range Status    MRSA by PCR NEGATIVE  NEGATIVE Final   Comment:            The GeneXpert MRSA Assay (FDA     approved for NASAL specimens     only), is one component of a     comprehensive MRSA colonization     surveillance program. It is not     intended to diagnose MRSA     infection nor to guide or     monitor treatment for     MRSA infections.     Radiographs/Studies:  Dg Chest Port 1 View  03/20/2014   CLINICAL DATA:  r/o pna  EXAM: PORTABLE CHEST - 1 VIEW  COMPARISON:  DG CHEST 1 VIEW dated 03/08/2014  FINDINGS: Low lung volumes. Cardiac silhouette is enlarged. The aorta is tortuous with atherosclerotic calcifications. There is prominence of the interstitial markings without peribronchial cuffing. Linear areas of density appreciated within the lung bases. There is minimal blunting of the costophrenic angles. Degenerative changes appreciated within the shoulders.  IMPRESSION: Pulmonary vascular congestion with a underlying component of chronic bronchitic changes. Scarring versus atelectasis within the lung bases. There also findings likely reflecting a permanent COPD. Degenerative changes within the shoulders.   Electronically Signed   By: Margaree Mackintosh M.D.   On: 03/20/2014 17:34    Medications:   . acetaminophen  1,000 mg Oral TID  . acidophilus  1 capsule Oral Daily  . antiseptic oral rinse  15 mL Mouth Rinse BID  . aspirin EC  325 mg Oral Daily  . atorvastatin  10 mg Oral q1800  . ceFEPime (MAXIPIME) IV  1 g Intravenous Q12H  . clindamycin (CLEOCIN) IV  600 mg Intravenous 3 times per day  . collagenase  1 application Topical Daily  . enoxaparin (LOVENOX) injection  40 mg Subcutaneous Q24H  . fluconazole  200 mg  Oral Daily  . hydrALAZINE  25 mg Oral BID  . levothyroxine  25 mcg Oral QAC breakfast  . memantine  10 mg Oral BID  . nystatin cream   Topical BID  . polyethylene glycol  17 g Oral Daily  . vancomycin  750 mg Intravenous Q12H   Continuous Infusions: . dextrose 5 % and 0.9 %  NaCl with KCl 20 mEq/L 75 mL/hr at 03/25/14 2029  . dextrose 5 % and 0.45% NaCl Stopped (03/24/14 1900)    Time spent: 25 minutes.   LOS: 6 days   Ricky [name]  [facility] Pager [phone]. If unable to reach me by pager, please call my cell phone at [phone].  *Please refer to [email or web address], password [email or web address] to get updated schedule on who will round on this patient, as hospitalists switch teams weekly. If 7PM-7AM, please contact night-coverage at [email or web address], password [email or web address] for any overnight needs.  03/26/2014, 7:44 AM    **Disclaimer: This note was dictated with voice recognition software. Similar sounding words can inadvertently be transcribed and this note may contain transcription errors which may not have been corrected upon publication of note.**

## 2014-03-26 NOTE — Discharge Summary (Addendum)
Physician Discharge Summary  Ricky [name] ZOX:[id number] DOB: Aug 01, 1917 DOA: 03/20/2014  PCP: Hollace Kinnier, DO  Admit date: 03/20/2014 Discharge date: 03/28/2014   Recommendations for Outpatient Follow-Up:   1. F/U with PCP in 2 weeks. 2. The patient is being discharged to [facility] for further rehab. 3. Recommend air mattress overlay at SNF.   Discharge Diagnosis:   Principal Problem:    Sepsis with fever secondary to UTI versus healthcare associated aspiration pneumonia Active Problems:    ALZHEIMERS DISEASE    Dysphagia    UTI (lower urinary tract infection)    HCAP (healthcare-associated pneumonia)    Fever    Metabolic encephalopathy, acute    Pulmonary edema    Protein-calorie malnutrition, severe    Supranuclear palsy    Decubitus ulcer of sacral region, unstageable    Hypokalemia    Hyponatremia  Discharge Condition: Improved.  Diet recommendation: Dysphasia 3, regular.   History of Present Illness:   Ricky [name] is an 80 y.o. male with a PMH of Alzheimer's/senile dementia, chronic indwelling Foley catheter, HTN, Parkinsonian syndrome, hypothyroid, gout, progressive supranuclear palsy, lives with his son, admitted on 03/20/14 after a 5 day history of progressive weakness. The patient also developed a new fever with progressive encephalopathy. The patient has had 4 admissions to the hospital to the hospital in the last 4 months for similar problems. He was recently discharged on 03/13/2014 after a mechanical fall and treatment for UTI. He finished ciprofloxacin 2 days prior to this admission. The patient was noted to have a temperature of 101.65F in the emergency department. The patient was noted to have pyuria and a chest x-ray with patchy bibasilar opacities with increased interstitial markings. The patient was started on intravenous antibiotics and IV fluids.   Hospital Course by Problem:   Principal Problem:  Sepsis with fever secondary to  UTI versus healthcare associated aspiration pneumonia   Patient met criteria for sepsis with a documented respiratory rate of 21 and a temperature of 101.65F on admission.   The patient was admitted and placed on broad-spectrum antibiotics including cefepime for concerns of recent Pseudomonas UTI, and clindamycin to cover aspiration pneumonia as well as vancomycin to cover MRSA. He has completed a seven-day course of treatment. Will D/C on an additional 7 days of therapy with oral Cipro.  One of 2 blood cultures positive for diptheroids, likely a contaminant.   Urine cultures grew greater than 100,000 colonies of yeast, started on Diflucan 03/22/14. Indwelling catheter changed 03/20/14. Recent Pseudomonas positive cultures with only 4 days of Cipro for treatment, so source of sepsis likely from partially treated pseudomonal UTI. Active Problems:  Hypokalemia / hyponatremia   Resolved with IV fluids and potassium supplementation. Sacral decubitus ulcer   Continue Santyl ointment to sacral wound.   Seen by wound care nurse on previous admission 03/10/14. Wound was unstageable. History of stroke   Continue aspirin and Lipitor. Hypothyroidism   Continue Synthroid. TSH is 0.804 on 03/21/14. Dysphasia   Diet advanced to dysphagia 3 on 03/22/14 per speech therapy recommendations. Acute metabolic encephalopathy in the setting of dementia and a history of supranuclear palsy   The patient typically becomes encephalopathic from acute infections. He has underlying supranuclear palsy and dementia. Continue Namenda. Neupro on hold (pharmacy doesn't stock).   Mental status back to usual baseline at discharge. Pulmonary edema / hypoxia   Initially given Lasix, the patient appeared to be clinically dry so this was discontinued.  Protein-calorie malnutrition, severe  Pt met criteria for severe MALNUTRITION in the context of chronic disease as evidenced by severe and moderate muscle wasting and  subcutaneous fat loss, 8% weight loss in 3 months, and likely PO intake <75% for > one month.   Seen and evaluated by dietitian 03/21/14. Diet advanced 03/22/14. Hypertension   Continue hydralazine.  Procedures:    None.   Medical Consultants:    None.   Discharge Exam:   Filed Vitals:   03/28/14 0535  BP: 158/86  Pulse: 69  Temp: 98.3 F (36.8 C)  Resp: 20   Filed Vitals:   03/27/14 1118 03/27/14 1326 03/27/14 2115 03/28/14 0535  BP: 148/78 133/79 151/85 158/86  Pulse:  70 96 69  Temp:  99.2 F (37.3 C) 98.2 F (36.8 C) 98.3 F (36.8 C)  TempSrc:  Oral Oral Oral  Resp:  16 20 20   Height:      Weight:    69.718 kg (153 lb 11.2 oz)  SpO2:  98% 98% 99%    Gen:  NAD Cardiovascular:  RRR, No M/R/G Respiratory: Lungs CTAB Gastrointestinal: Abdomen soft, NT/ND with normal active bowel sounds. Extremities: No C/E/C    Discharge Instructions:       Discharge Orders   Future Appointments Provider Department Dept Phone   04/03/2014 8:30 AM Fay Records, MD [place] [facility] [phone]   05/26/2014 11:30 AM Star Age, MD [facility] [phone]   Future Orders Complete By Expires   Call MD for:  extreme fatigue  As directed    Call MD for:  temperature >100.4  As directed    Diet general  As directed    Discharge instructions  As directed    Face-to-face encounter (required for Medicare/Medicaid patients)  As directed    Questions:     The encounter with the patient was in whole, or in part, for the following medical condition, which is the primary reason for home health care:  Debility following sepsis   I certify that, based on my findings, the following services are medically necessary home health services:  Nursing   Physical therapy   My clinical findings support the need for the above services:  Cognitive impairments, dementia, or mental confusion  that make it unsafe to leave home   Further, I certify that my clinical  findings support that this patient is homebound due to:  Unable to leave home safely without assistance   Reason for Medically Necessary Home Health Services:  Skilled Nursing- Skilled Assessment/Observation   Skilled Nursing- Assessment and Observation of Wound Status   Therapy- Therapeutic Exercises to Increase Strength and Endurance   Home Health  As directed    Questions:     To provide the following care/treatments:  PT   OT   RN   Home Health Aide   Increase activity slowly  As directed    Walk with assistance  As directed        Medication List         acetaminophen 500 MG tablet  Commonly known as:  TYLENOL  Take 1,000 mg by mouth 3 (three) times daily.     aspirin 325 MG EC tablet  Take 1 tablet (325 mg total) by mouth daily.     atorvastatin 10 MG tablet  Commonly known as:  LIPITOR  Take 1 tablet (10 mg total) by mouth daily at 6 PM.     bacitracin ointment  Apply 1 application topically 2 (two) times  daily. Applied to tip of penis for skin tear.     CALTRATE 600+D PLUS MINERALS 600-800 MG-UNIT Chew  Chew 1 tablet by mouth 2 (two) times daily with a meal.     ciprofloxacin 500 MG tablet  Commonly known as:  CIPRO  Take 1 tablet twice a day for 6 more days (through 04/03/14)     collagenase ointment  Commonly known as:  SANTYL  Apply 1 application topically daily.     CRANBERRY PO  Take 4,200 mg by mouth 3 (three) times daily.     D-Mannose Powd  Take 2,000 mg by mouth 2 (two) times daily.     ferrous sulfate 325 (65 FE) MG tablet  Take 325 mg by mouth every other day.     fluconazole 200 MG tablet  Commonly known as:  DIFLUCAN  Take 1 tablet (200 mg total) by mouth daily.     hydrALAZINE 25 MG tablet  Commonly known as:  APRESOLINE  Take 0.5 tablets (12.5 mg total) by mouth 2 (two) times daily.     lactose free nutrition Liqd  Take 90 mLs by mouth 2 (two) times daily between meals.     levothyroxine 25 MCG tablet  Commonly known as:   SYNTHROID, LEVOTHROID  Take 25 mcg by mouth daily before breakfast.     lidocaine 5 %  Commonly known as:  LIDODERM  PLACE 1 PATCH ONTO THE SKIN DAILY. REMOVE AFTER 12 HOURS.     MELATONIN EXTRA STRENGTH PO  Take 15 mLs by mouth at bedtime as needed (sleep).     memantine 10 MG tablet  Commonly known as:  NAMENDA  Take 10 mg by mouth 2 (two) times daily.     multivitamin with minerals Tabs tablet  Take 1 tablet by mouth daily.     NEUPRO 2 MG/24HR  Generic drug:  rotigotine  Place 1 patch onto the skin daily.     polyethylene glycol packet  Commonly known as:  MIRALAX / GLYCOLAX  Take 17 g by mouth daily.     PROBIOTIC DAILY Caps  Take 1 capsule by mouth daily.     Vitamin B-12 2500 MCG Subl  Place 5,000 mcg under the tongue daily.       Follow-up Information   Follow up with REED, TIFFANY, DO. Schedule an appointment as soon as possible for a visit in 2 weeks. [facility] followup)    Specialty:  Geriatric Medicine   Contact information:   [address] [place] [address] [phone]        The results of significant diagnostics from this hospitalization (including imaging, microbiology, ancillary and laboratory) are listed below for reference.     Significant Diagnostic Studies:   Radiographs: Dg Chest Port 1 View  03/20/2014   CLINICAL DATA:  r/o pna  EXAM: PORTABLE CHEST - 1 VIEW  COMPARISON:  DG CHEST 1 VIEW dated 03/08/2014  FINDINGS: Low lung volumes. Cardiac silhouette is enlarged. The aorta is tortuous with atherosclerotic calcifications. There is prominence of the interstitial markings without peribronchial cuffing. Linear areas of density appreciated within the lung bases. There is minimal blunting of the costophrenic angles. Degenerative changes appreciated within the shoulders.  IMPRESSION: Pulmonary vascular congestion with a underlying component of chronic bronchitic changes. Scarring versus atelectasis within the lung bases. There also findings  likely reflecting a permanent COPD. Degenerative changes within the shoulders.   Electronically Signed   By: Margaree Mackintosh M.D.   On: 03/20/2014 17:34  Labs:  Basic Metabolic Panel:  Recent Labs Lab 03/22/14 0315 03/23/14 0350 03/24/14 0345 03/25/14 0420 03/27/14 0353  NA 135* 139 135* 138  --   K 3.6* 3.4* 4.2 4.2  --   CL 98 103 101 105  --   CO2 25 24 23 22   --   GLUCOSE 96 101* 94 92  --   BUN 8 7 9 8   --   CREATININE 0.79 0.70 0.81 0.65 0.72  CALCIUM 8.7 8.8 8.8 9.0  --    GFR Estimated Creatinine Clearance: 55.7 ml/min (by C-G formula based on Cr of 0.72).  CBC:  Recent Labs Lab 03/22/14 0315 03/23/14 0350 03/25/14 0420  WBC 5.0 3.9* 3.4*  HGB 10.6* 10.8* 11.1*  HCT 33.0* 34.3* 34.4*  MCV 88.7 88.9 86.9  PLT 181 199 223   Microbiology Recent Results (from the past 240 hour(s))  CULTURE, BLOOD (ROUTINE X 2)     Status: None   Collection Time    03/20/14  4:30 PM      Result Value Ref Range Status   Specimen Description BLOOD RIGHT ANTECUBITAL   Final   Special Requests BOTTLES DRAWN AEROBIC ONLY 4ML   Final   Culture  Setup Time     Final   Value: 03/20/2014 21:48     Performed at [facility]   Culture     Final   Value: NO GROWTH 5 DAYS     Performed at [facility]   Report Status 03/26/2014 FINAL   Final  CULTURE, BLOOD (ROUTINE X 2)     Status: None   Collection Time    03/20/14  4:35 PM      Result Value Ref Range Status   Specimen Description BLOOD BLOOD RIGHT FOREARM   Final   Special Requests BOTTLES DRAWN AEROBIC AND ANAEROBIC 3ML   Final   Culture  Setup Time     Final   Value: 03/20/2014 21:48     Performed at [facility]   Culture     Final   Value: DIPHTHEROIDS(CORYNEBACTERIUM SPECIES)     Note: Standardized susceptibility testing for this organism is not available.     Note: Gram Stain Report Called to,Read Back By and Verified With: KATHRYN STRUP ON 03/21/2014 AT 6:47P BY Dennard Nip     Performed at [facility]   Report Status 03/22/2014 FINAL   Final  URINE CULTURE     Status: None   Collection Time    03/20/14  7:41 PM      Result Value Ref Range Status   Specimen Description URINE, CATHETERIZED   Final   Special Requests NONE   Final   Culture  Setup Time     Final   Value: 03/21/2014 00:11     Performed at [facility]     Final   Value: >=100,000 COLONIES/ML     Performed at [facility]   Culture     Final   Value: YEAST     Performed at [facility]   Report Status 03/21/2014 FINAL   Final  MRSA PCR SCREENING     Status: None   Collection Time    03/20/14 10:01 PM      Result Value Ref Range Status   MRSA by PCR NEGATIVE  NEGATIVE Final   Comment:            The GeneXpert MRSA Assay (FDA  approved for NASAL specimens     only), is one component of a     comprehensive MRSA colonization     surveillance program. It is not     intended to diagnose MRSA     infection nor to guide or     monitor treatment for     MRSA infections.    Time coordinating discharge: 35 minutes.  SignedVenetia Maxon Karem Farha  Pager [phone] [facility] 03/28/2014, 9:02 AM

## 2014-03-26 NOTE — Plan of Care (Signed)
Problem: Phase II Progression Outcomes Goal: Vital signs remain stable Outcome: Progressing Bp flucturates

## 2014-03-27 LAB — CREATININE, SERUM
Creatinine, Ser: 0.72 mg/dL (ref 0.50–1.35)
GFR calc non Af Amer: 77 mL/min — ABNORMAL LOW (ref >90)
GFR, EST AFRICAN AMERICAN: 90 mL/min — AB (ref >90)

## 2014-03-27 MED ORDER — CIPROFLOXACIN HCL 500 MG PO TABS
500.0000 mg | ORAL_TABLET | Freq: Two times a day (BID) | ORAL | Status: DC
  Administered 2014-03-27 – 2014-03-28 (×2): 500 mg via ORAL
  Filled 2014-03-27 (×4): qty 1

## 2014-03-27 NOTE — Progress Notes (Signed)
Physical Therapy Treatment Patient Details Name: Ricky [name] MRN: [id number] DOB: 26-Jun-1917 Today's Date: 03/27/2014    History of Present Illness 80 y.o. male with a history of progressive supranuclear palsy with dementia, hypertension, syncope, hypothyroidism presenting to [facility] ED after sustaining an episode of fall at home;  Pt was adm to [facility] approximately 1 wk ago     PT Comments    Pt in bed with difficulty to arouse.  Side to side rolling for hygiene/bath and BM pt remained rigid and verbally unresponsive. Unable to arouse enough to participate, used HOYER lift + 2 assit to get pt OOB to recliner.  Extras time for positioning with many pillows.  Follow Up Recommendations  SNF     Equipment Recommendations       Recommendations for Other Services       Precautions / Restrictions Precautions Precautions: Fall Precaution Comments: dementia Restrictions Weight Bearing Restrictions: No    Mobility  Bed Mobility Overal bed mobility: Needs Assistance;+2 for physical assistance Bed Mobility: Supine to Sit;Sit to Supine Rolling: Total assist;+2 for physical assistance (pt 0%)            Transfers                 General transfer comment:  (total assist + 2 pt 0%)  Ambulation/Gait                 Stairs            Wheelchair Mobility    Modified Rankin (Stroke Patients Only)       Balance                                    Cognition                            Exercises      General Comments        Pertinent Vitals/Pain     Home Living                      Prior Function            PT Goals (current goals can now be found in the care plan section) Progress towards PT goals: Progressing toward goals    Frequency  Min 2X/week    PT Plan      Co-evaluation             End of Session Equipment Utilized During Treatment: Gait belt Activity Tolerance: Patient limited by  lethargy Patient left: in chair;with call bell/phone within reach     Time: 1410-1435 PT Time Calculation (min): 25 min  Charges:  $Therapeutic Activity: 8-22 mins                    G Codes:      Rica Koyanagi  PTA [facility]  Acute  Rehab Pager      [phone]

## 2014-03-27 NOTE — Progress Notes (Signed)
Pt for discharge to [facility].  CSW spoke with Clinical Social Work Multimedia programmer, Nathaniel Man who is agreeable to providing [employer] a letter of guarantee until [employer] able to receive insurance authorization through [place]. [employer].  CSW sent pt discharge summary via [facility].   CSW spoke with pt daughter and pt son via telephone. Pt daughter and pt son are concerned about pt mental status and need for further IV antibiotics. CSW reviewed MD notes and discussed that MD discusses in progress note and CSW spoke with MD and MD reports that pt mental status is approaching baseline and that pt has completed IV antibiotics. CSW discussed that [facility] can continue to monitor pt and will be able to address any further concerns. Pt son and pt daughter discussed that they felt that the discharge was happening too late in the day and CSW expressed understanding and explained that unfortunately with the change in discharge plan to SNF this morning and pt being medically ready for discharge;  it is a later discharge for pt, but Isaias Cowman has all the needed documentation and medications for pt arrival. Pt son and pt daughter request to come to hospital to see pt before ambulance transport is arranged. CSW expressed understanding and discussed that CSW will have discharge packet ready and provide phone number to RN to call transport once pt family arrives.  CSW discussed RN and provided RN phone number to call report to [facility] and provided phone number for [facility] after 5 pm ([phone] option 1 for English and option 3 for non-emergent transport). RN aware that pt family plans to arrive in order to see pt before transport is arranged and then RN can call transport.  CSW notified evening ED CSW, Chesley Noon of situation and if any social work needs arise, evening ED CSW can be reached at [phone].  No further social work needs identified at this  time.  CSW signing off.   Alison Murray, MSW, [place] [facility] [phone]

## 2014-03-27 NOTE — Progress Notes (Signed)
Pt sitting up in chair, Ricky [name] lift used to move pt. He is sleeping most the time, but is easily awakens.To be discharged 5/12. Family at bedside, requested to speak with Dr, Rama. Dr. Maryelizabeth Kaufmann was in a meeting and states she would speak to them 5/12. Family informed and were OK with that.

## 2014-03-27 NOTE — Care Management Note (Signed)
Cm spoke with patient's son and daughter via telephone concerning discharge planning at [phone]. Pt's son and daughter are requesting SW consult for possible SNF options due to pt's progressive weakness and requirement of hoyer to lift patient from lying position. Per son and daughter if SNF options are not desirable, disposition may change to home with home health. Will continue to follow.    Venita Lick Cristal Howatt,MSN,RN [phone]

## 2014-03-27 NOTE — Progress Notes (Signed)
CSW following for disposition planning.  CSW spoke with pt son and pt daughter via telephone to discuss SNF bed offers.  Pt son and pt daughter choose bed at [facility].  CSW contacted facility who confirmed bed availability.  [place] submitting clinicals to [employer] for insurance authorization.  CSW sent clinicals to [facility] as well.  CSW to facilitate pt discharge needs when pt insurance authorization received.  Alison Murray, MSW, [place] Work [phone]

## 2014-03-27 NOTE — Progress Notes (Signed)
Progress Note   Ricky [name] KPV:[id number] DOB: 08/01/1917 DOA: 03/20/2014 PCP: Hollace Kinnier, DO   Brief Narrative:   Ricky [name] is an 80 y.o. male with a PMH of Alzheimer's/senile dementia, chronic indwelling Foley catheter, HTN, Parkinsonian syndrome, hypothyroid, gout, progressive supranuclear palsy, lives with his son, admitted on 03/20/14 after a 5 day history of progressive weakness. The patient also developed a new fever with progressive encephalopathy. The patient has had 4 admissions to the hospital to the hospital in the last 4 months for similar problems. He was recently discharged on 03/13/2014 after a mechanical fall and treatment for UTI. He finished ciprofloxacin 2 days prior to this admission. The patient was noted to have a temperature of 101.39F in the emergency department. The patient was noted to have pyuria and a chest x-ray with patchy bibasilar opacities with increased interstitial markings. The patient was started on intravenous antibiotics and IV fluids. There was initial concern for pulmonary edema, and the patient was started on furosemide IV twice a day.   Assessment/Plan:   Principal Problem:   Sepsis with fever secondary to UTI versus healthcare associated aspiration pneumonia  Patient met criteria for sepsis with a documented respiratory rate of 21 and a temperature of 101.39F on admission.  The patient was admitted and placed on broad-spectrum antibiotics including cefepime for concerns of recent Pseudomonas UTI, and clindamycin to cover aspiration pneumonia. Continue antibiotics through today, then discontinue.  One of 2 blood cultures positive for diptheroids, likely a contaminant.  Urine cultures grew greater than 100,000 colonies of yeast, started on Diflucan 03/22/14. Indwelling catheter changed 03/20/14.  Recent Pseudomonas positive cultures with only 4 days of Cipro for treatment, so source of sepsis likely from partially treated pseudomonal  UTI. Active Problems:   Hypokalemia / hyponatremia  IV fluids changed to normal saline with 20 mEq of potassium per liter at 75 cc/hour 03/22/14.  Hyponatremia improved. Hypokalemia resolved after being given 4 runs of IV potassium 03/23/14.   Sacral decubitus ulcer  Continue Santyl ointment to sacral wound.  Seen by wound care nurse on previous admission 03/10/14. Wound was unstageable.   History of stroke  Continue aspirin and Lipitor.   Hypothyroidism  Continue Synthroid. TSH is 0.804 on 03/21/14.   Dysphasia  Diet advanced to dysphagia 3 on 03/22/14 per speech therapy recommendations.   Acute metabolic encephalopathy in the setting of dementia and a history of supranuclear palsy  The patient typically becomes encephalopathic from acute infections. He has underlying supranuclear palsy and dementia.  Continue Namenda.  Neupro on hold (pharmacy doesn't stock).  Mental status appears to be approaching usual baseline.   Pulmonary edema / hypoxia  Initially given Lasix, the patient appeared to be clinically dry so this was discontinued.  Continue clindamycin to cover aspiration pneumonia.   Protein-calorie malnutrition, severe  Pt met criteria for severe MALNUTRITION in the context of chronic disease as evidenced by severe and moderate muscle wasting and subcutaneous fat loss, 8% weight loss in 3 months, and likely PO intake <75% for > one month.  Seen and evaluated by dietitian 03/21/14. Diet advanced 03/22/14.   Hypertension  Continue hydralazine.   DVT Prophylaxis  Continue Lovenox.   Code Status: DNR Family Communication: Son, [name], updated at bedside 03/26/14. Disposition Plan: Home versus SNF.   IV Access:    Peripheral IV   Procedures:    None.   Medical Consultants:    None.   Other Consultants:  Speech therapy  Dietitian  Physical therapy  Occupational therapy   Anti-Infectives:    Vancomycin 03/20/14--->03/26/14  Clindamycin  03/20/49--->03/26/14  Cefepime 03/20/14---> 03/26/14  Diflucan 03/22/14--->  Subjective:   Ricky [name] remains awake and responsive. No complaints. Eating well.  Objective:    Filed Vitals:   03/27/14 0500 03/27/14 1100 03/27/14 1118 03/27/14 1326  BP: 101/71 148/78 148/78 133/79  Pulse: 51 72  70  Temp: 98.2 F (36.8 C)   99.2 F (37.3 C)  TempSrc: Oral   Oral  Resp: 20   16  Height:      Weight: 69.854 kg (154 lb)     SpO2: 100%   98%    Intake/Output Summary (Last 24 hours) at 03/27/14 1440 Last data filed at 03/27/14 1300  Gross per 24 hour  Intake 5072.08 ml  Output   2440 ml  Net 2632.08 ml    Exam: Gen:  NAD, awake  Cardiovascular:  RRR, No M/R/G Respiratory:  Lungs diminished Gastrointestinal:  Abdomen soft, NT/ND, + BS Extremities:  Venous stasis changes   Data Reviewed:    Labs: Basic Metabolic Panel:  Recent Labs Lab 03/21/14 0312 03/22/14 0315 03/23/14 0350 03/24/14 0345 03/25/14 0420 03/27/14 0353  NA 137 135* 139 135* 138  --   K 3.5* 3.6* 3.4* 4.2 4.2  --   CL 99 98 103 101 105  --   CO2 25 25 24 23 22   --   GLUCOSE 107* 96 101* 94 92  --   BUN 9 8 7 9 8   --   CREATININE 0.80 0.79 0.70 0.81 0.65 0.72  CALCIUM 8.9 8.7 8.8 8.8 9.0  --    GFR Estimated Creatinine Clearance: 55.8 ml/min (by C-G formula based on Cr of 0.72).  CBC:  Recent Labs Lab 03/20/14 1635 03/20/14 1655 03/21/14 0312 03/22/14 0315 03/23/14 0350 03/25/14 0420  WBC 8.8  --  6.8 5.0 3.9* 3.4*  NEUTROABS 7.3  --   --   --   --   --   HGB 11.5* 13.6 11.0* 10.6* 10.8* 11.1*  HCT 34.9* 40.0 33.2* 33.0* 34.3* 34.4*  MCV 87.5  --  86.7 88.7 88.9 86.9  PLT 225  --  209 181 199 223   BNP (last 3 results)  Recent Labs  03/21/14 0312  PROBNP 3769.0*   Sepsis Labs:  Recent Labs Lab 03/20/14 1645 03/21/14 0312 03/22/14 0315 03/23/14 0350 03/25/14 0420  WBC  --  6.8 5.0 3.9* 3.4*  LATICACIDVEN 1.51  --   --   --   --    Microbiology Recent Results  (from the past 240 hour(s))  CULTURE, BLOOD (ROUTINE X 2)     Status: None   Collection Time    03/20/14  4:30 PM      Result Value Ref Range Status   Specimen Description BLOOD RIGHT ANTECUBITAL   Final   Special Requests BOTTLES DRAWN AEROBIC ONLY 4ML   Final   Culture  Setup Time     Final   Value: 03/20/2014 21:48     Performed at [facility]   Culture     Final   Value: NO GROWTH 5 DAYS     Performed at [facility]   Report Status 03/26/2014 FINAL   Final  CULTURE, BLOOD (ROUTINE X 2)     Status: None   Collection Time    03/20/14  4:35 PM      Result Value Ref Range  Status   Specimen Description BLOOD BLOOD RIGHT FOREARM   Final   Special Requests BOTTLES DRAWN AEROBIC AND ANAEROBIC 3ML   Final   Culture  Setup Time     Final   Value: 03/20/2014 21:48     Performed at [facility]   Culture     Final   Value: DIPHTHEROIDS(CORYNEBACTERIUM SPECIES)     Note: Standardized susceptibility testing for this organism is not available.     Note: Gram Stain Report Called to,Read Back By and Verified With: KATHRYN STRUP ON 03/21/2014 AT 6:47P BY Dennard Nip     Performed at [facility]   Report Status 03/22/2014 FINAL   Final  URINE CULTURE     Status: None   Collection Time    03/20/14  7:41 PM      Result Value Ref Range Status   Specimen Description URINE, CATHETERIZED   Final   Special Requests NONE   Final   Culture  Setup Time     Final   Value: 03/21/2014 00:11     Performed at [place]     Final   Value: >=100,000 COLONIES/ML     Performed at [facility]   Culture     Final   Value: YEAST     Performed at [facility]   Report Status 03/21/2014 FINAL   Final  MRSA PCR SCREENING     Status: None   Collection Time    03/20/14 10:01 PM      Result Value Ref Range Status   MRSA by PCR NEGATIVE  NEGATIVE Final   Comment:            The GeneXpert MRSA Assay (FDA     approved for NASAL specimens      only), is one component of a     comprehensive MRSA colonization     surveillance program. It is not     intended to diagnose MRSA     infection nor to guide or     monitor treatment for     MRSA infections.     Radiographs/Studies:  Dg Chest Port 1 View  03/20/2014   CLINICAL DATA:  r/o pna  EXAM: PORTABLE CHEST - 1 VIEW  COMPARISON:  DG CHEST 1 VIEW dated 03/08/2014  FINDINGS: Low lung volumes. Cardiac silhouette is enlarged. The aorta is tortuous with atherosclerotic calcifications. There is prominence of the interstitial markings without peribronchial cuffing. Linear areas of density appreciated within the lung bases. There is minimal blunting of the costophrenic angles. Degenerative changes appreciated within the shoulders.  IMPRESSION: Pulmonary vascular congestion with a underlying component of chronic bronchitic changes. Scarring versus atelectasis within the lung bases. There also findings likely reflecting a permanent COPD. Degenerative changes within the shoulders.   Electronically Signed   By: Margaree Mackintosh M.D.   On: 03/20/2014 17:34    Medications:   . acetaminophen  1,000 mg Oral TID  . acidophilus  1 capsule Oral Daily  . antiseptic oral rinse  15 mL Mouth Rinse BID  . aspirin EC  325 mg Oral Daily  . atorvastatin  10 mg Oral q1800  . collagenase  1 application Topical Daily  . enoxaparin (LOVENOX) injection  40 mg Subcutaneous Q24H  . fluconazole  200 mg Oral Daily  . hydrALAZINE  25 mg Oral BID  . levothyroxine  25 mcg Oral QAC breakfast  . memantine  10 mg Oral BID  .  nystatin cream   Topical BID  . polyethylene glycol  17 g Oral Daily   Continuous Infusions: . dextrose 5 % and 0.9 % NaCl with KCl 20 mEq/L 75 mL/hr at 03/27/14 0410  . dextrose 5 % and 0.45% NaCl Stopped (03/24/14 1900)    Time spent: 25 minutes.   LOS: 7 days   [place]  [facility] Pager [phone]. If unable to reach me by pager, please call my cell phone at  [phone].  *Please refer to [email or web address], password [email or web address] to get updated schedule on who will round on this patient, as hospitalists switch teams weekly. If 7PM-7AM, please contact night-coverage at [email or web address], password [email or web address] for any overnight needs.  03/27/2014, 2:40 PM    **Disclaimer: This note was dictated with voice recognition software. Similar sounding words can inadvertently be transcribed and this note may contain transcription errors which may not have been corrected upon publication of note.**

## 2014-03-27 NOTE — Progress Notes (Addendum)
CSW received phone call from [facility].  Per [employer], discharge information did not indicate need for air overlay mattress and from conversation with family and review of documents about pt wound that pt wound is unstageable and will require a special mattress. CSW spoke to RN who stated that pt is on an air overlay mattress here in hospital. Per Isaias Cowman, facility will not be able to obtain air overlay mattress until tomorrow and facility does not feel safe accepting pt without appropriate mattress available given the extent of pt wound. [facility] agreeable to accepting pt tomorrow once air overlay mattress arrives.  CSW notified MD and RN.  CSW to facilitate pt discharge needs tomorrow once facility receives air overlay mattress.  Alison Murray, MSW, [place] Work [phone]

## 2014-03-27 NOTE — Progress Notes (Addendum)
Clinical Social Work Department CLINICAL SOCIAL WORK PLACEMENT NOTE 03/27/2014  Patient:  CHRISTINA, WALDROP  Account Number:  [id number] Admit date:  03/20/2014  Clinical Social Worker:  Ulyess Blossom  Date/time:  03/27/2014 10:45 AM  Clinical Social Work is seeking post-discharge placement for this patient at the following level of care:   SKILLED NURSING   (*CSW will update this form in Epic as items are completed)   03/27/2014  Patient/family provided with [place] Department of Clinical Social Work's list of facilities offering this level of care within the geographic area requested by the patient (or if unable, by the patient's family).  03/27/2014  Patient/family informed of their freedom to choose among providers that offer the needed level of care, that participate in Medicare, Medicaid or managed care program needed by the patient, have an available bed and are willing to accept the patient.  03/27/2014  Patient/family informed of [facility]' ownership interest in [facility], as well as of the fact that they are under no obligation to receive care at this facility.  PASARR submitted to EDS on 03/27/2014 PASARR number received from EDS on 03/27/2014  FL2 transmitted to all facilities in geographic area requested by pt/family on  03/27/2014 FL2 transmitted to all facilities within larger geographic area on   Patient informed that his/her managed care company has contracts with or will negotiate with  certain facilities, including the following:     Patient/family informed of bed offers received: 03/27/2014  Patient chooses bed at [facility] Physician recommends and patient chooses bed at    Patient to be transferred to  on  [facility] on 03/28/2014 Patient to be transferred to facility by ambulance Corey Harold)  The following physician request were entered in Epic:   Additional Comments:   Alison Murray, MSW, [place] Work [phone]

## 2014-03-27 NOTE — Progress Notes (Signed)
Clinical Social Work Department BRIEF PSYCHOSOCIAL ASSESSMENT 03/27/2014  Patient:  Ricky [name], Ricky [name]     Account Number:  [id number]     Admit date:  03/20/2014  Clinical Social Worker:  Ulyess Blossom  Date/Time:  03/27/2014 10:30 AM  Referred by:  Care Management  Date Referred:  03/27/2014 Referred for  SNF Placement   Other Referral:   Interview type:  Family Other interview type:    PSYCHOSOCIAL DATA Living Status:  FAMILY Admitted from facility:   Level of care:   Primary support name:  Coleman Kalas Primary support relationship to patient:  CHILD, ADULT Degree of support available:   adequate    CURRENT CONCERNS Current Concerns  Post-Acute Placement   Other Concerns:    SOCIAL WORK ASSESSMENT / PLAN CSW received notification from [facility] that pt family had planned to take pt home upon discharge, but due to pt's progressive weakness and requirement of hoyer to lift patient from lying position, pt family requesting CSW to explore SNF options.    CSW spoke with pt son and pt daughter via telephone. CSW introduced self and explained role. Pt family are agreeable to SNF search in [facility] excluding [employer] and [facility] as they have had unsatisfying experiences at both facilities in the past. Pt family report that they are still undecided between pt going to SNF and returning home with family support, but would like to know what SNF options are available.    CSW expressed understanding and discussed process of SNF search. CSW explained that pt may not have a lot of options given that pt insurance is [employer]. Pt family expressed understanding.    CSW completed FL2 and initiated SNF search to [facility] excluding [employer] and [facility].    CSW to follow up with pt family re: SNF bed offers in order for pt family to make decision between SNF placement and pt returning home.    Pt has [employer] Medicare which  requires SNF authorization prior to pt discharge from hospital.    CSW to continue to follow to assist with pt disposition needs.   Assessment/plan status:  Psychosocial Support/Ongoing Assessment of Needs Other assessment/ plan:   discharge planning   Information/referral to community resources:   [facility] list    PATIENT'S/FAMILY'S RESPONSE TO PLAN OF CARE: Pt alert and oriented x 2. Pt family appears actively involved in pt care. Pt family concerned about pt progressive weakness and feel that pt has declined cognitively since hospital stay. Pt family weighing options between SNF and home dependent on SNF options as they have had negative experiences at SNF in the past.    Alison Murray, MSW, [place] Work [phone]

## 2014-03-28 MED ORDER — CIPROFLOXACIN HCL 500 MG PO TABS: ORAL_TABLET | ORAL | Status: DC

## 2014-03-28 NOTE — Progress Notes (Addendum)
Pt for discharge to [facility].  CSW confirmed that [facility] has appropriate air overlay mattress and letter of guarantee until [facility] authorization received.  CSW spoke with pt son via telephone who stated that pt son and pt daughter were able to visit pt at hospital this morning and are agreeable to transition to [facility].  CSW facilitated pt discharge needs including contacting facility, faxing pt discharge information via [facility], discussing with pt son via telephone, providing RN phone number to call report, and arranging ambulance transport for pt to [facility] for pick up at 11:30 am via [facility].(Service Request ID#: [id number]).  No further social work needs identified at this time.  CSW signing off.   Alison Murray, MSW, [place] Work [phone]

## 2014-03-28 NOTE — Progress Notes (Signed)
Progress Note   ROURKE MCQUITTY DJT:[id number] DOB: 02/14/1917 DOA: 03/20/2014 PCP: Hollace Kinnier, DO   Brief Narrative:   Ricky [name] is an 80 y.o. male with a PMH of Alzheimer's/senile dementia, chronic indwelling Foley catheter, HTN, Parkinsonian syndrome, hypothyroid, gout, progressive supranuclear palsy, lives with his son, admitted on 03/20/14 after a 5 day history of progressive weakness. The patient also developed a new fever with progressive encephalopathy. The patient has had 4 admissions to the hospital to the hospital in the last 4 months for similar problems. He was recently discharged on 03/13/2014 after a mechanical fall and treatment for UTI. He finished ciprofloxacin 2 days prior to this admission. The patient was noted to have a temperature of 101.27F in the emergency department. The patient was noted to have pyuria and a chest x-ray with patchy bibasilar opacities with increased interstitial markings. The patient was started on intravenous antibiotics and IV fluids. There was initial concern for pulmonary edema, and the patient was started on furosemide IV twice a day.   Assessment/Plan:   Principal Problem:   Sepsis with fever secondary to UTI versus healthcare associated aspiration pneumonia  Patient met criteria for sepsis with a documented respiratory rate of 21 and a temperature of 101.27F on admission.  The patient was admitted and placed on broad-spectrum antibiotics including cefepime for concerns of recent Pseudomonas UTI, and clindamycin to cover aspiration pneumonia. Completed 7 days of broad-spectrum antibiotics, now on Cipro for an additional 7 days.  One of 2 blood cultures positive for diptheroids, likely a contaminant.  Urine cultures grew greater than 100,000 colonies of yeast, started on Diflucan 03/22/14. Indwelling catheter changed 03/20/14.  Recent Pseudomonas positive cultures with only 4 days of Cipro for treatment, so source of sepsis likely from  partially treated pseudomonal UTI. Active Problems:   Hypokalemia / hyponatremia  IV fluids changed to normal saline with 20 mEq of potassium per liter at 75 cc/hour 03/22/14.  Hyponatremia improved. Hypokalemia resolved after being given 4 runs of IV potassium 03/23/14.   Sacral decubitus ulcer  Continue Santyl ointment to sacral wound.  Seen by wound care nurse on previous admission 03/10/14. Wound was unstageable.   History of stroke  Continue aspirin and Lipitor.   Hypothyroidism  Continue Synthroid. TSH is 0.804 on 03/21/14.   Dysphasia  Diet advanced to dysphagia 3 on 03/22/14 per speech therapy recommendations.   Acute metabolic encephalopathy in the setting of dementia and a history of supranuclear palsy  The patient typically becomes encephalopathic from acute infections. He has underlying supranuclear palsy and dementia.  Continue Namenda.  Neupro on hold (pharmacy doesn't stock).  Mental status appears to be approaching usual baseline.   Pulmonary edema / hypoxia  Initially given Lasix, the patient appeared to be clinically dry so this was discontinued.  Continue clindamycin to cover aspiration pneumonia.   Protein-calorie malnutrition, severe  Pt met criteria for severe MALNUTRITION in the context of chronic disease as evidenced by severe and moderate muscle wasting and subcutaneous fat loss, 8% weight loss in 3 months, and likely PO intake <75% for > one month.  Seen and evaluated by dietitian 03/21/14. Diet advanced 03/22/14.   Hypertension  Continue hydralazine.   DVT Prophylaxis  Continue Lovenox.   Code Status: DNR Family Communication: Donne Anon, updated 03/27/14. Disposition Plan: SNF.   IV Access:    Peripheral IV   Procedures:    None.   Medical Consultants:    None.   Other  Consultants:    Speech therapy  Dietitian  Physical therapy  Occupational therapy   Anti-Infectives:    Vancomycin 03/20/14--->03/26/14  Clindamycin  03/20/49--->03/26/14  Cefepime 03/20/14---> 03/26/14  Cipro 03/27/14--->  Diflucan 03/22/14--->  Subjective:   [name] remains awake and responsive, although family was concerned yesterday because he wasn't quite as alert as he had been. No complaints. Eating well.  Objective:    Filed Vitals:   03/27/14 1118 03/27/14 1326 03/27/14 2115 03/28/14 0535  BP: 148/78 133/79 151/85 158/86  Pulse:  70 96 69  Temp:  99.2 F (37.3 C) 98.2 F (36.8 C) 98.3 F (36.8 C)  TempSrc:  Oral Oral Oral  Resp:  16 20 20   Height:      Weight:    69.718 kg (153 lb 11.2 oz)  SpO2:  98% 98% 99%    Intake/Output Summary (Last 24 hours) at 03/28/14 0902 Last data filed at 03/28/14 0600  Gross per 24 hour  Intake 5210.83 ml  Output   2550 ml  Net 2660.83 ml    Exam: Gen:  NAD, awake  Cardiovascular:  RRR, No M/R/G Respiratory:  Lungs diminished Gastrointestinal:  Abdomen soft, NT/ND, + BS Extremities:  Venous stasis changes   Data Reviewed:    Labs: Basic Metabolic Panel:  Recent Labs Lab 03/22/14 0315 03/23/14 0350 03/24/14 0345 03/25/14 0420 03/27/14 0353  NA 135* 139 135* 138  --   K 3.6* 3.4* 4.2 4.2  --   CL 98 103 101 105  --   CO2 25 24 23 22   --   GLUCOSE 96 101* 94 92  --   BUN 8 7 9 8   --   CREATININE 0.79 0.70 0.81 0.65 0.72  CALCIUM 8.7 8.8 8.8 9.0  --    GFR Estimated Creatinine Clearance: 55.7 ml/min (by C-G formula based on Cr of 0.72).  CBC:  Recent Labs Lab 03/22/14 0315 03/23/14 0350 03/25/14 0420  WBC 5.0 3.9* 3.4*  HGB 10.6* 10.8* 11.1*  HCT 33.0* 34.3* 34.4*  MCV 88.7 88.9 86.9  PLT 181 199 223   BNP (last 3 results)  Recent Labs  03/21/14 0312  PROBNP 3769.0*   Sepsis Labs:  Recent Labs Lab 03/22/14 0315 03/23/14 0350 03/25/14 0420  WBC 5.0 3.9* 3.4*   Microbiology Recent Results (from the past 240 hour(s))  CULTURE, BLOOD (ROUTINE X 2)     Status: None   Collection Time    03/20/14  4:30 PM      Result Value Ref Range  Status   Specimen Description BLOOD RIGHT ANTECUBITAL   Final   Special Requests BOTTLES DRAWN AEROBIC ONLY 4ML   Final   Culture  Setup Time     Final   Value: 03/20/2014 21:48     Performed at [facility]   Culture     Final   Value: NO GROWTH 5 DAYS     Performed at [facility]   Report Status 03/26/2014 FINAL   Final  CULTURE, BLOOD (ROUTINE X 2)     Status: None   Collection Time    03/20/14  4:35 PM      Result Value Ref Range Status   Specimen Description BLOOD BLOOD RIGHT FOREARM   Final   Special Requests BOTTLES DRAWN AEROBIC AND ANAEROBIC 3ML   Final   Culture  Setup Time     Final   Value: 03/20/2014 21:48     Performed at [facility]  Culture     Final   Value: DIPHTHEROIDS(CORYNEBACTERIUM SPECIES)     Note: Standardized susceptibility testing for this organism is not available.     Note: Gram Stain Report Called to,Read Back By and Verified With: KATHRYN STRUP ON 03/21/2014 AT 6:47P BY Dennard Nip     Performed at [facility]   Report Status 03/22/2014 FINAL   Final  URINE CULTURE     Status: None   Collection Time    03/20/14  7:41 PM      Result Value Ref Range Status   Specimen Description URINE, CATHETERIZED   Final   Special Requests NONE   Final   Culture  Setup Time     Final   Value: 03/21/2014 00:11     Performed at [facility]     Final   Value: >=100,000 COLONIES/ML     Performed at [facility]   Culture     Final   Value: YEAST     Performed at [facility]   Report Status 03/21/2014 FINAL   Final  MRSA PCR SCREENING     Status: None   Collection Time    03/20/14 10:01 PM      Result Value Ref Range Status   MRSA by PCR NEGATIVE  NEGATIVE Final   Comment:            The GeneXpert MRSA Assay (FDA     approved for NASAL specimens     only), is one component of a     comprehensive MRSA colonization     surveillance program. It is not     intended to diagnose MRSA      infection nor to guide or     monitor treatment for     MRSA infections.     Radiographs/Studies:  Dg Chest Port 1 View  03/20/2014   CLINICAL DATA:  r/o pna  EXAM: PORTABLE CHEST - 1 VIEW  COMPARISON:  DG CHEST 1 VIEW dated 03/08/2014  FINDINGS: Low lung volumes. Cardiac silhouette is enlarged. The aorta is tortuous with atherosclerotic calcifications. There is prominence of the interstitial markings without peribronchial cuffing. Linear areas of density appreciated within the lung bases. There is minimal blunting of the costophrenic angles. Degenerative changes appreciated within the shoulders.  IMPRESSION: Pulmonary vascular congestion with a underlying component of chronic bronchitic changes. Scarring versus atelectasis within the lung bases. There also findings likely reflecting a permanent COPD. Degenerative changes within the shoulders.   Electronically Signed   By: Margaree Mackintosh M.D.   On: 03/20/2014 17:34    Medications:   . acetaminophen  1,000 mg Oral TID  . acidophilus  1 capsule Oral Daily  . antiseptic oral rinse  15 mL Mouth Rinse BID  . aspirin EC  325 mg Oral Daily  . atorvastatin  10 mg Oral q1800  . ciprofloxacin  500 mg Oral BID  . collagenase  1 application Topical Daily  . enoxaparin (LOVENOX) injection  40 mg Subcutaneous Q24H  . fluconazole  200 mg Oral Daily  . hydrALAZINE  25 mg Oral BID  . levothyroxine  25 mcg Oral QAC breakfast  . memantine  10 mg Oral BID  . nystatin cream   Topical BID  . polyethylene glycol  17 g Oral Daily   Continuous Infusions: . dextrose 5 % and 0.9 % NaCl with KCl 20 mEq/L 75 mL/hr (03/27/14 2008)  . dextrose 5 % and 0.45%  NaCl Stopped (03/24/14 1900)    Time spent: 25 minutes.   LOS: 8 days   [place]  [facility] Pager [phone]. If unable to reach me by pager, please call my cell phone at [phone].  *Please refer to [email or web address], password [email or web address] to get updated schedule on who will round on this patient, as  hospitalists switch teams weekly. If 7PM-7AM, please contact night-coverage at [email or web address], password [email or web address] for any overnight needs.  03/28/2014, 9:02 AM    **Disclaimer: This note was dictated with voice recognition software. Similar sounding words can inadvertently be transcribed and this note may contain transcription errors which may not have been corrected upon publication of note.**

## 2014-03-28 NOTE — Progress Notes (Signed)
Nursing Discharge Summary  Patient ID: KAYDENN MCLEAR MRN: [id number] DOB/AGE: [birth year] 80 y.o.  Admit date: 03/20/2014 Discharge date: 03/28/2014  Discharged Condition: good  Disposition: 03-Skilled Nursing Facility  Follow-up Information   Follow up with REED, TIFFANY, DO. Schedule an appointment as soon as possible for a visit in 2 weeks. [facility] followup)    Specialty:  Geriatric Medicine   Contact information:   [address] [place] [address] [phone]       Prescriptions Given: Sent in packet to SNF  Means of Discharge: Patient to be transported to SNF via [facility]  Signed: Buel Ream 03/28/2014, 1:11 PM

## 2014-03-29 ENCOUNTER — Non-Acute Institutional Stay (SKILLED_NURSING_FACILITY): Payer: Medicare HMO | Admitting: Adult Health

## 2014-03-29 DIAGNOSIS — R5381 Other malaise: Secondary | ICD-10-CM

## 2014-03-29 DIAGNOSIS — L89109 Pressure ulcer of unspecified part of back, unspecified stage: Secondary | ICD-10-CM

## 2014-03-29 DIAGNOSIS — G309 Alzheimer's disease, unspecified: Secondary | ICD-10-CM

## 2014-03-29 DIAGNOSIS — G20C Parkinsonism, unspecified: Secondary | ICD-10-CM

## 2014-03-29 DIAGNOSIS — D638 Anemia in other chronic diseases classified elsewhere: Secondary | ICD-10-CM

## 2014-03-29 DIAGNOSIS — L8995 Pressure ulcer of unspecified site, unstageable: Secondary | ICD-10-CM

## 2014-03-29 DIAGNOSIS — G20 Parkinson's disease: Secondary | ICD-10-CM

## 2014-03-29 DIAGNOSIS — F0280 Dementia in other diseases classified elsewhere without behavioral disturbance: Secondary | ICD-10-CM

## 2014-03-29 DIAGNOSIS — L89150 Pressure ulcer of sacral region, unstageable: Secondary | ICD-10-CM

## 2014-03-29 DIAGNOSIS — R1310 Dysphagia, unspecified: Secondary | ICD-10-CM

## 2014-03-29 DIAGNOSIS — E785 Hyperlipidemia, unspecified: Secondary | ICD-10-CM

## 2014-03-29 DIAGNOSIS — I10 Essential (primary) hypertension: Secondary | ICD-10-CM

## 2014-03-29 DIAGNOSIS — J189 Pneumonia, unspecified organism: Secondary | ICD-10-CM

## 2014-03-29 DIAGNOSIS — N390 Urinary tract infection, site not specified: Secondary | ICD-10-CM

## 2014-03-31 ENCOUNTER — Emergency Department (HOSPITAL_COMMUNITY)
Admission: EM | Admit: 2014-03-31 | Discharge: 2014-03-31 | Disposition: A | Discharge status: Home / Self Care | Payer: Medicare HMO | Attending: Emergency Medicine | Admitting: Emergency Medicine

## 2014-03-31 ENCOUNTER — Emergency Department (HOSPITAL_COMMUNITY): Payer: Medicare HMO

## 2014-03-31 ENCOUNTER — Encounter: Payer: Self-pay | Admitting: Adult Health

## 2014-03-31 ENCOUNTER — Non-Acute Institutional Stay (SKILLED_NURSING_FACILITY): Payer: Medicare HMO | Admitting: Adult Health

## 2014-03-31 ENCOUNTER — Encounter (HOSPITAL_COMMUNITY): Payer: Self-pay | Admitting: Emergency Medicine

## 2014-03-31 DIAGNOSIS — R627 Adult failure to thrive: Secondary | ICD-10-CM

## 2014-03-31 DIAGNOSIS — Z87448 Personal history of other diseases of urinary system: Secondary | ICD-10-CM | POA: Insufficient documentation

## 2014-03-31 DIAGNOSIS — Z87440 Personal history of urinary (tract) infections: Secondary | ICD-10-CM | POA: Insufficient documentation

## 2014-03-31 DIAGNOSIS — Z79899 Other long term (current) drug therapy: Secondary | ICD-10-CM | POA: Insufficient documentation

## 2014-03-31 DIAGNOSIS — G20A1 Parkinson's disease without dyskinesia, without mention of fluctuations: Secondary | ICD-10-CM | POA: Insufficient documentation

## 2014-03-31 DIAGNOSIS — R509 Fever, unspecified: Secondary | ICD-10-CM

## 2014-03-31 DIAGNOSIS — F0280 Dementia in other diseases classified elsewhere without behavioral disturbance: Secondary | ICD-10-CM | POA: Insufficient documentation

## 2014-03-31 DIAGNOSIS — Z7982 Long term (current) use of aspirin: Secondary | ICD-10-CM | POA: Insufficient documentation

## 2014-03-31 DIAGNOSIS — N390 Urinary tract infection, site not specified: Secondary | ICD-10-CM

## 2014-03-31 DIAGNOSIS — G20 Parkinson's disease: Secondary | ICD-10-CM | POA: Insufficient documentation

## 2014-03-31 DIAGNOSIS — Z8619 Personal history of other infectious and parasitic diseases: Secondary | ICD-10-CM | POA: Insufficient documentation

## 2014-03-31 DIAGNOSIS — E038 Other specified hypothyroidism: Secondary | ICD-10-CM | POA: Insufficient documentation

## 2014-03-31 DIAGNOSIS — G309 Alzheimer's disease, unspecified: Secondary | ICD-10-CM | POA: Insufficient documentation

## 2014-03-31 DIAGNOSIS — J189 Pneumonia, unspecified organism: Secondary | ICD-10-CM

## 2014-03-31 DIAGNOSIS — Z87891 Personal history of nicotine dependence: Secondary | ICD-10-CM | POA: Insufficient documentation

## 2014-03-31 DIAGNOSIS — Z8673 Personal history of transient ischemic attack (TIA), and cerebral infarction without residual deficits: Secondary | ICD-10-CM | POA: Insufficient documentation

## 2014-03-31 DIAGNOSIS — M1990 Unspecified osteoarthritis, unspecified site: Secondary | ICD-10-CM | POA: Insufficient documentation

## 2014-03-31 DIAGNOSIS — Z9889 Other specified postprocedural states: Secondary | ICD-10-CM | POA: Insufficient documentation

## 2014-03-31 DIAGNOSIS — R5381 Other malaise: Secondary | ICD-10-CM | POA: Insufficient documentation

## 2014-03-31 DIAGNOSIS — I10 Essential (primary) hypertension: Secondary | ICD-10-CM | POA: Insufficient documentation

## 2014-03-31 DIAGNOSIS — F0390 Unspecified dementia without behavioral disturbance: Secondary | ICD-10-CM | POA: Insufficient documentation

## 2014-03-31 DIAGNOSIS — D649 Anemia, unspecified: Secondary | ICD-10-CM | POA: Insufficient documentation

## 2014-03-31 DIAGNOSIS — J159 Unspecified bacterial pneumonia: Secondary | ICD-10-CM | POA: Insufficient documentation

## 2014-03-31 DIAGNOSIS — M810 Age-related osteoporosis without current pathological fracture: Secondary | ICD-10-CM | POA: Insufficient documentation

## 2014-03-31 DIAGNOSIS — K5900 Constipation, unspecified: Secondary | ICD-10-CM | POA: Insufficient documentation

## 2014-03-31 LAB — COMPREHENSIVE METABOLIC PANEL
ALBUMIN: 3.5 g/dL (ref 3.5–5.2)
ALT: 15 U/L (ref 0–53)
AST: 21 U/L (ref 0–37)
Alkaline Phosphatase: 96 U/L (ref 39–117)
BUN: 17 mg/dL (ref 6–23)
CALCIUM: 9.5 mg/dL (ref 8.4–10.5)
CO2: 24 mEq/L (ref 19–32)
CREATININE: 0.72 mg/dL (ref 0.50–1.35)
Chloride: 102 mEq/L (ref 96–112)
GFR calc Af Amer: 90 mL/min — ABNORMAL LOW (ref >90)
GFR calc non Af Amer: 77 mL/min — ABNORMAL LOW (ref >90)
Glucose, Bld: 101 mg/dL — ABNORMAL HIGH (ref 70–99)
Potassium: 4.2 mEq/L (ref 3.7–5.3)
Sodium: 139 mEq/L (ref 137–147)
Total Bilirubin: 0.4 mg/dL (ref 0.3–1.2)
Total Protein: 8.1 g/dL (ref 6.0–8.3)

## 2014-03-31 LAB — CBC WITH DIFFERENTIAL/PLATELET
BASOS PCT: 0 % (ref 0–1)
Basophils Absolute: 0 10*3/uL (ref 0.0–0.1)
EOS ABS: 0.1 10*3/uL (ref 0.0–0.7)
EOS PCT: 2 % (ref 0–5)
HEMATOCRIT: 35.7 % — AB (ref 39.0–52.0)
Hemoglobin: 11.8 g/dL — ABNORMAL LOW (ref 13.0–17.0)
Lymphocytes Relative: 20 % (ref 12–46)
Lymphs Abs: 1.3 10*3/uL (ref 0.7–4.0)
MCH: 29.1 pg (ref 26.0–34.0)
MCHC: 33.1 g/dL (ref 30.0–36.0)
MCV: 88.1 fL (ref 78.0–100.0)
MONO ABS: 0.6 10*3/uL (ref 0.1–1.0)
Monocytes Relative: 10 % (ref 3–12)
Neutro Abs: 4.5 10*3/uL (ref 1.7–7.7)
Neutrophils Relative %: 68 % (ref 43–77)
Platelets: 213 10*3/uL (ref 150–400)
RBC: 4.05 MIL/uL — ABNORMAL LOW (ref 4.22–5.81)
RDW: 14.3 % (ref 11.5–15.5)
WBC: 6.6 10*3/uL (ref 4.0–10.5)

## 2014-03-31 LAB — URINALYSIS, ROUTINE W REFLEX MICROSCOPIC
Bilirubin Urine: NEGATIVE
GLUCOSE, UA: NEGATIVE mg/dL
Ketones, ur: NEGATIVE mg/dL
Leukocytes, UA: NEGATIVE
Nitrite: NEGATIVE
Protein, ur: 30 mg/dL — AB
SPECIFIC GRAVITY, URINE: 1.017 (ref 1.005–1.030)
UROBILINOGEN UA: 1 mg/dL (ref 0.0–1.0)
pH: 6.5 (ref 5.0–8.0)

## 2014-03-31 LAB — URINE MICROSCOPIC-ADD ON

## 2014-03-31 MED ORDER — DEXTROSE 5 % IV SOLN
1.0000 g | Freq: Once | INTRAVENOUS | Status: AC
  Administered 2014-03-31: 1 g via INTRAVENOUS
  Filled 2014-03-31: qty 10

## 2014-03-31 MED ORDER — CEFDINIR 300 MG PO CAPS: 300.0000 mg | ORAL_CAPSULE | Freq: Two times a day (BID) | ORAL | Status: DC

## 2014-03-31 MED ORDER — HYDRALAZINE HCL 25 MG PO TABS: 25.0000 mg | ORAL_TABLET | Freq: Three times a day (TID) | ORAL | Status: DC

## 2014-03-31 MED ORDER — CLONIDINE HCL 0.1 MG PO TABS: 0.1000 mg | ORAL_TABLET | Freq: Four times a day (QID) | ORAL | Status: DC | PRN

## 2014-03-31 NOTE — ED Notes (Signed)
Patient transported to X-ray 

## 2014-03-31 NOTE — ED Notes (Signed)
Charge Nurse Amie  talked to daughter ,Jeris Roser and informed that pt. Is going to be discharge to HOME at this add. [address] [place] , pt. Did not meet criteria for pt. To be admitted to hospital at this time.  Joselyn Arrow , pt.'s son going to receive pt. At this add.

## 2014-03-31 NOTE — ED Notes (Addendum)
Spoke to patient daughter Ricky [name]) states patient should not return to [facility]. States patient needs to return to his private residence at [address] [place], where his son will be able to receive him into the home. Primary nurse spoke to patient son Ricky [name] [phone]) who verifies he is home and able to assist in the patient's care. Patient daughter states patient is under home health care with [facility]. Amy, Case Management is contacting [facility] to determine if patient requires any specialized equipment. [facility] called for patient transport home by primary nurse. Patient daughter is requesting a call when patient is transported from facility.

## 2014-03-31 NOTE — ED Provider Notes (Signed)
CSN: [id number]     Arrival date & time 03/31/14  1830 History   First MD Initiated Contact with Patient 03/31/14 1831     Chief Complaint  Patient presents with  . Fever     (Consider location/radiation/quality/duration/timing/severity/associated sxs/prior Treatment) The history is provided by the patient, medical records and the nursing home.    Patient with hx dementia, parkisonson's, chronic indwelling foley catheter with recurrent hospitalizations for sepsis/UTI/pneumonia (4 admissions this year) sent in from [place] home by PCP [facility]) for 24 hours of elevated temperature, ongoing treatment for UTI and HCAP.  Only noted temperature on paperwork is 99.5.  Has been receiving tylenol as well as Cipro for these symptoms.    Level V caveat for dementia.    Past Medical History  Diagnosis Date  . IBS (irritable bowel syndrome)   . Diverticulosis   . Cataract   . OAB (overactive bladder)   . Macular degeneration   . Alzheimer disease   . Hypertension   . Arthritis   . Osteoporosis   . Parkinsonian syndrome   . Syncope, vasovagal   . Neuromuscular disorder     parkisonian syndrome  . 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 Unspecified transient cerebral ischemia   . Unspecified urinary incontinence   . Senile dementia, uncomplicated   . Unspecified constipation   . Hypothyroidism   . Anemia, unspecified   . Depression   . Alzheimer's disease   . Gout, unspecified   . Thoracic or lumbosacral neuritis or radiculitis, unspecified   . Progressive supranuclear palsy   . Unspecified transient cerebral ischemia   . Unspecified urinary incontinence   . Syncope and collapse   . Paralysis agitans   . Unspecified transient cerebral ischemia   . Pneumonitis due to inhalation of food or vomitus   . Cellulitis and abscess of oral soft tissues   . Screening for lipoid disorders   . Senile dementia, uncomplicated   .  Unspecified constipation   . Disorder of bone and cartilage, unspecified   . Other specified acquired hypothyroidism   . Unspecified hypothyroidism   . Anemia, unspecified   . Depressive disorder, not elsewhere classified   . Alzheimer's disease   . Paralysis agitans   . Pneumonia, organism unspecified   . Pseudomonas infection in conditions classified elsewhere and of unspecified site   . Unspecified essential hypertension   . Urinary tract infection, site not specified   . Osteoarthrosis, unspecified whether generalized or localized, unspecified site   . Osteoporosis, unspecified   . Gout, unspecified   . Thoracic or lumbosacral neuritis or radiculitis, unspecified   . Stroke    Past Surgical History  Procedure Laterality Date  . Esophagogastroduodenoscopy    . Eye surgery  2006    LEFT CATARACT   Family History  Problem Relation Age of Onset  . Cancer Mother   . Stroke Father   . Liver disease Father   . Kidney disease Brother   . Liver disease Brother   . Emphysema Brother    History  Substance Use Topics  . Smoking status: Former Research scientist (life sciences)  . Smokeless tobacco: Never Used     Comment: QUIT SMOKING BACK IN THE LATE 50'S  . Alcohol Use: No    Review of Systems  Unable to perform ROS: Dementia      Allergies  Other  Home Medications   Prior to Admission medications   Medication Sig Start Date  End Date Taking? Authorizing Provider  acetaminophen (TYLENOL) 500 MG tablet Take 1,000 mg by mouth 3 (three) times daily.    Yes Historical Provider, MD  aspirin EC 325 MG EC tablet Take 1 tablet (325 mg total) by mouth daily. 02/03/13  Yes Ripudeep Krystal Eaton, MD  atorvastatin (LIPITOR) 10 MG tablet Take 1 tablet (10 mg total) by mouth daily at 6 PM. 08/29/13 08/29/14 Yes Tiffany L Reed, DO  Calcium Carbonate-Vit D-Min (CALTRATE 600+D PLUS) 600-800 MG-UNIT CHEW Chew 1 tablet by mouth 2 (two) times daily with a meal.    Yes Historical Provider, MD  ciprofloxacin (CIPRO) 500 MG  tablet Take 1 tablet twice a day for 6 more days (through 04/03/14) 03/28/14  Yes Christina P Rama, MD  CRANBERRY PO Take 400 mg by mouth 3 (three) times daily.    Yes Historical Provider, MD  Cyanocobalamin (VITAMIN B-12) 2500 MCG SUBL Place 5,000 mcg under the tongue daily.    Yes Historical Provider, MD  D-Mannose POWD Take 2,000 mg by mouth 2 (two) times daily.   Yes Historical Provider, MD  ferrous sulfate 325 (65 FE) MG tablet Take 325 mg by mouth every other day.    Yes Historical Provider, MD  fluconazole (DIFLUCAN) 200 MG tablet Take 1 tablet (200 mg total) by mouth daily. 03/26/14  Yes Venetia Maxon Rama, MD  hydrALAZINE (APRESOLINE) 25 MG tablet Take 12.5 mg by mouth 2 (two) times daily.   Yes Historical Provider, MD  lactose free nutrition (BOOST) LIQD Take 90 mLs by mouth 2 (two) times daily between meals.    Yes Historical Provider, MD  levothyroxine (SYNTHROID, LEVOTHROID) 25 MCG tablet Take 25 mcg by mouth daily before breakfast.   Yes Historical Provider, MD  lidocaine (LIDODERM) 5 % Place 1 patch onto the skin daily. Remove & Discard patch within 12 hours or as directed by MD   Yes Historical Provider, MD  Melatonin 3 MG CAPS Take 6 mg by mouth at bedtime as needed (sleep).   Yes Historical Provider, MD  memantine (NAMENDA) 10 MG tablet Take 10 mg by mouth 2 (two) times daily.   Yes Historical Provider, MD  Multiple Vitamin (MULTIVITAMIN WITH MINERALS) TABS Take 1 tablet by mouth daily.   Yes Historical Provider, MD  NEUPRO 2 MG/24HR Place 1 patch onto the skin daily.  01/24/14  Yes Historical Provider, MD  polyethylene glycol (MIRALAX / GLYCOLAX) packet Take 17 g by mouth daily.   Yes Historical Provider, MD  Probiotic Product (PROBIOTIC DAILY) CAPS Take 1 capsule by mouth daily.   Yes Historical Provider, MD  cloNIDine (CATAPRES) 0.1 MG tablet Take 1 tablet (0.1 mg total) by mouth every 6 (six) hours as needed. For systolic b/p >161 0/96/04   Gerlene Fee, NP   BP 185/84  Pulse 73   Temp(Src) 99.6 F (37.6 C) (Rectal)  Resp 16  SpO2 93% Physical Exam  Nursing note and vitals reviewed. Constitutional: He appears well-developed and well-nourished. No distress.  contracted  HENT:  Head: Normocephalic and atraumatic.  Neck: Neck supple.  Cardiovascular: Normal rate, regular rhythm and intact distal pulses.   Pulmonary/Chest: Effort normal and breath sounds normal. No respiratory distress. He has no wheezes. He has no rales.  Abdominal: Soft. He exhibits no distension. There is no tenderness. There is no rebound and no guarding.  Exam limited due to patient's contracted state  Musculoskeletal: He exhibits no edema and no tenderness.  Neurological: He is alert. He exhibits normal muscle  tone.  Oriented to self and place (thinks he is at [facility]).  Does not know year or president.    Skin: He is not diaphoretic.    ED Course  Procedures (including critical care time) Labs Review Labs Reviewed  CBC WITH DIFFERENTIAL - Abnormal; Notable for the following:    RBC 4.05 (*)    Hemoglobin 11.8 (*)    HCT 35.7 (*)    All other components within normal limits  COMPREHENSIVE METABOLIC PANEL - Abnormal; Notable for the following:    Glucose, Bld 101 (*)    GFR calc non Af Amer 77 (*)    GFR calc Af Amer 90 (*)    All other components within normal limits  URINALYSIS, ROUTINE W REFLEX MICROSCOPIC - Abnormal; Notable for the following:    Hgb urine dipstick SMALL (*)    Protein, ur 30 (*)    All other components within normal limits  URINE CULTURE  URINE MICROSCOPIC-ADD ON    Imaging Review Dg Chest 2 View  03/31/2014   CLINICAL DATA:  Fever and UTI with history of community acquired pneumonia; history of dementia and Parkinson's  EXAM: CHEST  2 VIEW  COMPARISON:  DG CHEST 1V PORT dated 03/20/2014; DG CHEST 1 VIEW dated 03/08/2014; DG THORACIC SPINE dated 03/08/2014  FINDINGS: The lungs are adequately inflated. The pulmonary interstitial markings are coarse similar to  that seen previously. Density overlying the lower thoracic spine on the lateral film may reflect scarring or atelectasis or pneumonia. The cardiac silhouette is mildly enlarged. The central pulmonary vascularity is prominent.  IMPRESSION: There are chronically increased pulmonary interstitial markings. Some of this is likely related to pulmonary vascular congestion. Density in the retrocardiac region on the left posteriorly may reflect atelectasis or pneumonia. There is no evidence of a pleural effusion.   Electronically Signed   By: David  Martinique   On: 03/31/2014 20:17     EKG Interpretation   Date/Time:  Friday Mar 31 2014 18:50:18 EDT Ventricular Rate:  72 PR Interval:  156 QRS Duration: 155 QT Interval:  456 QTC Calculation: 499 R Axis:   0 Text Interpretation:  Sinus rhythm Right bundle branch block Left  ventricular hypertrophy Artifact in lead(s) I II III aVR aVL aVF V1 V2 V3  V4 No significant change since last tracing Confirmed by POLLINA  MD,  CHRISTOPHER ([id number]) on 03/31/2014 7:27:03 PM      9:00 PM Discussed pt with Dr Betsey Holiday.   Filed Vitals:   03/31/14 2047  BP:   Pulse: 71  Temp: 100.6 F (38.1 C)  Resp: 18    9:34 PM Pt discussed at length with Dr Betsey Holiday.  We have also reviewed his recent admission and current and previous xrays.  Pt was discharged three days ago from the hospital for the same issues, is currently undergoing treatment for these infections.  He is having no apparent difficulty breathing, is not hypoxic, and is voiding well with unchanged renal function.   MDM   Final diagnoses:  Fever  HCAP (healthcare-associated pneumonia)    Pt with hx fever and ongoing treatment for yeast UTI and pneumonia with diflucan and cipro.  Sent from nursing home to explore options.  Labs remarkable only for mild anemia.  Urine without apparent infection.  CXR shows mostly chronic change and density that may be atelectasis or pneumonia.  Pt has dementia but appears  comfortable and has no complaints.  O2 saturation and vitals with exception of temperature are  normal.  Pt has been undergoing treatment for some time, suspect he might need broader coverage.  Per my discussion with Dr Betsey Holiday, will give Rocephin here and add omnicef and d/c home.  Home health arranged by case manager.      [place], PA-C 03/31/14 2355

## 2014-03-31 NOTE — Progress Notes (Signed)
Patient ID: Ricky [name], male   DOB: 06/30/1917, 80 y.o.   MRN: [id number]     [facility]  Allergies  Allergen Reactions  . Other Other (See Comments)    Dairy products- cause swallowing difficulty      Chief Complaint  Patient presents with  . Acute Visit    patient status     HPI:  He did have a fever last night. He is presently being treated for uti with yeast and pneumonia. He is unable to participate in the hpi or ros. I initially increased the length of time for his diflucan for another 10 days. However; he is having insurance issues and his family will have to pay privately for his stay here after today. In light of this information; his family has decided the best option for his care is to have him seen in the hospital.   Past Medical History  Diagnosis Date  . IBS (irritable bowel syndrome)   . Diverticulosis   . Cataract   . OAB (overactive bladder)   . Macular degeneration   . Alzheimer disease   . Hypertension   . Arthritis   . Osteoporosis   . Parkinsonian syndrome   . Syncope, vasovagal   . Neuromuscular disorder     parkisonian syndrome  . 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 Unspecified transient cerebral ischemia   . Unspecified urinary incontinence   . Senile dementia, uncomplicated   . Unspecified constipation   . Hypothyroidism   . Anemia, unspecified   . Depression   . Alzheimer's disease   . Gout, unspecified   . Thoracic or lumbosacral neuritis or radiculitis, unspecified   . Progressive supranuclear palsy   . Unspecified transient cerebral ischemia   . Unspecified urinary incontinence   . Syncope and collapse   . Paralysis agitans   . Unspecified transient cerebral ischemia   . Pneumonitis due to inhalation of food or vomitus   . Cellulitis and abscess of oral soft tissues   . Screening for lipoid disorders   . Senile dementia, uncomplicated   . Unspecified constipation   . Disorder of bone  and cartilage, unspecified   . Other specified acquired hypothyroidism   . Unspecified hypothyroidism   . Anemia, unspecified   . Depressive disorder, not elsewhere classified   . Alzheimer's disease   . Paralysis agitans   . Pneumonia, organism unspecified   . Pseudomonas infection in conditions classified elsewhere and of unspecified site   . Unspecified essential hypertension   . Urinary tract infection, site not specified   . Osteoarthrosis, unspecified whether generalized or localized, unspecified site   . Osteoporosis, unspecified   . Gout, unspecified   . Thoracic or lumbosacral neuritis or radiculitis, unspecified   . Stroke     Past Surgical History  Procedure Laterality Date  . Esophagogastroduodenoscopy    . Eye surgery  2006    LEFT CATARACT    VITAL SIGNS BP 200/110  Pulse 70  Ht 5\' 10"  (1.778 m)  Wt 141 lb 8 oz (64.184 kg)  BMI 20.30 kg/m2   Patient's Medications  New Prescriptions   No medications on file  Previous Medications   ACETAMINOPHEN (TYLENOL) 500 MG TABLET    Take 1,000 mg by mouth 3 (three) times daily.    ASPIRIN EC 325 MG EC TABLET    Take 1 tablet (325 mg total) by mouth daily.   ATORVASTATIN (LIPITOR) 10 MG TABLET  Take 1 tablet (10 mg total) by mouth daily at 6 PM.   BACITRACIN OINTMENT    Apply 1 application topically 2 (two) times daily. Applied to tip of penis for skin tear.   CALCIUM CARBONATE-VIT D-MIN (CALTRATE 600+D PLUS) 600-800 MG-UNIT CHEW    Chew 1 tablet by mouth 2 (two) times daily with a meal.    CIPROFLOXACIN (CIPRO) 500 MG TABLET    Take 1 tablet twice a day for 6 more days (through 04/03/14)   COLLAGENASE (SANTYL) OINTMENT    Apply 1 application topically daily.   CRANBERRY PO    Take 4,200 mg by mouth 3 (three) times daily.   CYANOCOBALAMIN (VITAMIN B-12) 2500 MCG SUBL    Place 5,000 mcg under the tongue daily.    D-MANNOSE POWD    Take 2,000 mg by mouth 2 (two) times daily.   FERROUS SULFATE 325 (65 FE) MG TABLET     Take 325 mg by mouth every other day.    FLUCONAZOLE (DIFLUCAN) 200 MG TABLET    Take 1 tablet (200 mg total) by mouth daily.   HYDRALAZINE (APRESOLINE) 25 MG TABLET    Take 0.5 tablets (12.5 mg total) by mouth 2 (two) times daily.   LACTOSE FREE NUTRITION (BOOST) LIQD    Take 90 mLs by mouth 2 (two) times daily between meals.    LEVOTHYROXINE (SYNTHROID, LEVOTHROID) 25 MCG TABLET    Take 25 mcg by mouth daily before breakfast.   LIDOCAINE (LIDODERM) 5 %    PLACE 1 PATCH ONTO THE SKIN DAILY. REMOVE AFTER 12 HOURS.   MELATONIN EXTRA STRENGTH PO    Take 15 mLs by mouth at bedtime as needed (sleep).    MEMANTINE (NAMENDA) 10 MG TABLET    Take 10 mg by mouth 2 (two) times daily.   MULTIPLE VITAMIN (MULTIVITAMIN WITH MINERALS) TABS    Take 1 tablet by mouth daily.   NEUPRO 2 MG/24HR    Place 1 patch onto the skin daily.    POLYETHYLENE GLYCOL (MIRALAX / GLYCOLAX) PACKET    Take 17 g by mouth daily.   PROBIOTIC PRODUCT (PROBIOTIC DAILY) CAPS    Take 1 capsule by mouth daily.  Modified Medications   No medications on file  Discontinued Medications   No medications on file    SIGNIFICANT DIAGNOSTIC EXAMS  03-20-14: chest x-ray; Pulmonary vascular congestion with a underlying component of chronic bronchitic changes. Scarring versus atelectasis within the lung bases. There also findings likely reflecting a permanent COPD. Degenerative changes within the shoulders.       LABS REVIEWED:  03-20-14: wbc 8.8; hgb 11.5; hct 34.9; mcv 87.5; plt 225; glucose 110; bun 12; creat 1.0;k+4.6;  na++ 135; urine culture: yeast: blood culture: no growth 03-21-14: tsh 0.804; BNP: 3769.0 03-24-14: glucose 94; bun 9; creat 0.81; k+4.2; na++135 03-25-14: wbc 3.4; hgb 11.1; hct 34.4; mcv 86.9; plt 223      Review of Systems  Unable to perform ROS     Physical Exam  Constitutional: No distress.  frail  Neck: Neck supple. No JVD present.  Cardiovascular: Normal rate, regular rhythm and intact distal pulses.     Respiratory: Effort normal and breath sounds normal. No respiratory distress. He has no wheezes.  GI: Soft. Bowel sounds are normal. He exhibits no distension.  Genitourinary:  has foley  Musculoskeletal: He exhibits no edema.  Has upper extremity contractures; does not move lower extremities.   Neurological: He is alert.  Skin: Skin is warm  and dry. He is not diaphoretic.  Has penile skin tear Sacrum unstaged: 3.8 x 3.2 x 0 cm 95% slough present with 5% pink tissue present. No signs of infection present treated with santyl.       ASSESSMENT/ PLAN:  1. Fever 2. UTI 3. Hypertension 4. FTT  I have increased his hydralazine to 25 mg three times daily and have begun clonidine 0.1 mg every 6 hours as needed for systolic b/p >267  In light of his insurance issues; and that he has had a fever; will send him to the ED for further evaluation and treatment options.  His family is present at bedside and is in agreement with this plan of care.      Time spent with patient 45 minutes.        Ok Edwards NP [facility]  Contact [phone] Monday through Friday 8am- 5pm  After hours call [phone]

## 2014-03-31 NOTE — ED Notes (Addendum)
Per EMS: Pt is from [facility]. Reason for transfer: Recent admit with UTI / HCAP/ fever; elevated temperature x 24 hours / BP elevated. Pt is receiving 1000 mg of tylenol TID, last given at 2 pm. Pt is also receiving Cipro 500 mg daily. Pt has sacral decubitus ulcer and has foley catheter in place. Pt has history of alzheimer's and parkinson's disease.

## 2014-03-31 NOTE — Discharge Instructions (Signed)
Read the information below.  Use the prescribed medication as directed.  Please discuss all new medications with your pharmacist.  You may return to the Emergency Department at any time for worsening condition or any new symptoms that concern you.  If you develop fever that does not improve with tylenol, develop chest pain, shortness of breath, or are unable to tolerate fluids by mouth, return to the ER for a recheck.    Fever, Adult A fever is a higher than normal body temperature. In an adult, an oral temperature around 98.6 F (37 C) is considered normal. A temperature of 100.4 F (38 C) or higher is generally considered a fever. Mild or moderate fevers generally have no long-term effects and often do not require treatment. Extreme fever (greater than or equal to 106 F or 41.1 C) can cause seizures. The sweating that may occur with repeated or prolonged fever may cause dehydration. Elderly people can develop confusion during a fever. A measured temperature can vary with:  Age.  Time of day.  Method of measurement (mouth, underarm, rectal, or ear). The fever is confirmed by taking a temperature with a thermometer. Temperatures can be taken different ways. Some methods are accurate and some are not.  An oral temperature is used most commonly. Electronic thermometers are fast and accurate.  An ear temperature will only be accurate if the thermometer is positioned as recommended by the manufacturer.  A rectal temperature is accurate and done for those adults who have a condition where an oral temperature cannot be taken.  An underarm (axillary) temperature is not accurate and not recommended. Fever is a symptom, not a disease.  CAUSES   Infections commonly cause fever.  Some noninfectious causes for fever include:  Some arthritis conditions.  Some thyroid or adrenal gland conditions.  Some immune system conditions.  Some types of cancer.  A medicine reaction.  High doses of  certain street drugs such as methamphetamine.  Dehydration.  Exposure to high outside or room temperatures.  Occasionally, the source of a fever cannot be determined. This is sometimes called a "fever of unknown origin" (FUO).  Some situations may lead to a temporary rise in body temperature that may go away on its own. Examples are:  Childbirth.  Surgery.  Intense exercise. HOME CARE INSTRUCTIONS   Take appropriate medicines for fever. Follow dosing instructions carefully. If you use acetaminophen to reduce the fever, be careful to avoid taking other medicines that also contain acetaminophen. Do not take aspirin for a fever if you are younger than age 8. There is an association with Reye's syndrome. Reye's syndrome is a rare but potentially deadly disease.  If an infection is present and antibiotics have been prescribed, take them as directed. Finish them even if you start to feel better.  Rest as needed.  Maintain an adequate fluid intake. To prevent dehydration during an illness with prolonged or recurrent fever, you may need to drink extra fluid.Drink enough fluids to keep your urine clear or pale yellow.  Sponging or bathing with room temperature water may help reduce body temperature. Do not use ice water or alcohol sponge baths.  Dress comfortably, but do not over-bundle. SEEK MEDICAL CARE IF:   You are unable to keep fluids down.  You develop vomiting or diarrhea.  You are not feeling at least partly better after 3 days.  You develop new symptoms or problems. SEEK IMMEDIATE MEDICAL CARE IF:   You have shortness of breath or trouble breathing.  You develop excessive weakness.  You are dizzy or you faint.  You are extremely thirsty or you are making little or no urine.  You develop new pain that was not there before (such as in the head, neck, chest, back, or abdomen).  You have persistant vomiting and diarrhea for more than 1 to 2 days.  You develop a stiff  neck or your eyes become sensitive to light.  You develop a skin rash.  You have a fever or persistent symptoms for more than 2 to 3 days.  You have a fever and your symptoms suddenly get worse. MAKE SURE YOU:   Understand these instructions.  Will watch your condition.  Will get help right away if you are not doing well or get worse. Document Released: 04/29/2001 Document Revised: 01/26/2012 Document Reviewed: 09/04/2011 [facility] Patient Information 2014 [place], [place].

## 2014-03-31 NOTE — Progress Notes (Signed)
Patient ID: Ricky [name], male   DOB: [birth year], 80 y.o.   MRN: [id number]     [facility]  Allergies  Allergen Reactions  . Other Other (See Comments)    Dairy products- cause swallowing difficulty      Chief Complaint  Patient presents with  . Hospitalization Follow-up    HPI:  He has been hospitalized for ut; pneumonia and sepsis. He is here for rehab services. At this time the goal of his care is short term with him returning back home with his family's care. He is unable to participate in the hpi or ros.    Past Medical History  Diagnosis Date  . IBS (irritable bowel syndrome)   . Diverticulosis   . Cataract   . OAB (overactive bladder)   . Macular degeneration   . Alzheimer disease   . Hypertension   . Arthritis   . Osteoporosis   . Parkinsonian syndrome   . Syncope, vasovagal   . Neuromuscular disorder     parkisonian syndrome  . 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 Unspecified transient cerebral ischemia   . Unspecified urinary incontinence   . Senile dementia, uncomplicated   . Unspecified constipation   . Hypothyroidism   . Anemia, unspecified   . Depression   . Alzheimer's disease   . Gout, unspecified   . Thoracic or lumbosacral neuritis or radiculitis, unspecified   . Progressive supranuclear palsy   . Unspecified transient cerebral ischemia   . Unspecified urinary incontinence   . Syncope and collapse   . Paralysis agitans   . Unspecified transient cerebral ischemia   . Pneumonitis due to inhalation of food or vomitus   . Cellulitis and abscess of oral soft tissues   . Screening for lipoid disorders   . Senile dementia, uncomplicated   . Unspecified constipation   . Disorder of bone and cartilage, unspecified   . Other specified acquired hypothyroidism   . Unspecified hypothyroidism   . Anemia, unspecified   . Depressive disorder, not elsewhere classified   . Alzheimer's disease   . Paralysis  agitans   . Pneumonia, organism unspecified   . Pseudomonas infection in conditions classified elsewhere and of unspecified site   . Unspecified essential hypertension   . Urinary tract infection, site not specified   . Osteoarthrosis, unspecified whether generalized or localized, unspecified site   . Osteoporosis, unspecified   . Gout, unspecified   . Thoracic or lumbosacral neuritis or radiculitis, unspecified   . Stroke     Past Surgical History  Procedure Laterality Date  . Esophagogastroduodenoscopy    . Eye surgery  2006    LEFT CATARACT    VITAL SIGNS BP 150/90  Pulse 72  Ht 5\' 10"  (1.778 m)  Wt 158 lb (71.668 kg)  BMI 22.67 kg/m2   Patient's Medications  New Prescriptions   No medications on file  Previous Medications   ACETAMINOPHEN (TYLENOL) 500 MG TABLET    Take 1,000 mg by mouth 3 (three) times daily.    ASPIRIN EC 325 MG EC TABLET    Take 1 tablet (325 mg total) by mouth daily.   ATORVASTATIN (LIPITOR) 10 MG TABLET    Take 1 tablet (10 mg total) by mouth daily at 6 PM.   BACITRACIN OINTMENT    Apply 1 application topically 2 (two) times daily. Applied to tip of penis for skin tear.   CALCIUM CARBONATE-VIT D-MIN (CALTRATE 600+D PLUS) 600-800 MG-UNIT CHEW  Chew 1 tablet by mouth 2 (two) times daily with a meal.    CIPROFLOXACIN (CIPRO) 500 MG TABLET    Take 1 tablet twice a day for 6 more days (through 04/03/14)   COLLAGENASE (SANTYL) OINTMENT    Apply 1 application topically daily.   CRANBERRY PO    Take 4,200 mg by mouth 3 (three) times daily.   CYANOCOBALAMIN (VITAMIN B-12) 2500 MCG SUBL    Place 5,000 mcg under the tongue daily.    D-MANNOSE POWD    Take 2,000 mg by mouth 2 (two) times daily.   FERROUS SULFATE 325 (65 FE) MG TABLET    Take 325 mg by mouth every other day.    FLUCONAZOLE (DIFLUCAN) 200 MG TABLET    Take 1 tablet (200 mg total) by mouth daily.   HYDRALAZINE (APRESOLINE) 25 MG TABLET    Take 0.5 tablets (12.5 mg total) by mouth 2 (two) times  daily.   LACTOSE FREE NUTRITION (BOOST) LIQD    Take 90 mLs by mouth 2 (two) times daily between meals.    LEVOTHYROXINE (SYNTHROID, LEVOTHROID) 25 MCG TABLET    Take 25 mcg by mouth daily before breakfast.   LIDOCAINE (LIDODERM) 5 %    PLACE 1 PATCH ONTO THE SKIN DAILY. REMOVE AFTER 12 HOURS.   MELATONIN EXTRA STRENGTH PO    Take 15 mLs by mouth at bedtime as needed (sleep).    MEMANTINE (NAMENDA) 10 MG TABLET    Take 10 mg by mouth 2 (two) times daily.   MULTIPLE VITAMIN (MULTIVITAMIN WITH MINERALS) TABS    Take 1 tablet by mouth daily.   NEUPRO 2 MG/24HR    Place 1 patch onto the skin daily.    POLYETHYLENE GLYCOL (MIRALAX / GLYCOLAX) PACKET    Take 17 g by mouth daily.   PROBIOTIC PRODUCT (PROBIOTIC DAILY) CAPS    Take 1 capsule by mouth daily.  Modified Medications   No medications on file  Discontinued Medications   No medications on file    SIGNIFICANT DIAGNOSTIC EXAMS  03-20-14: chest x-ray; Pulmonary vascular congestion with a underlying component of chronic bronchitic changes. Scarring versus atelectasis within the lung bases. There also findings likely reflecting a permanent COPD. Degenerative changes within the shoulders.       LABS REVIEWED:  03-20-14: wbc 8.8; hgb 11.5; hct 34.9; mcv 87.5; plt 225; glucose 110; bun 12; creat 1.0;k+4.6;  na++ 135; urine culture: yeast: blood culture: no growth 03-21-14: tsh 0.804; BNP: 3769.0 03-24-14: glucose 94; bun 9; creat 0.81; k+4.2; na++135 03-25-14: wbc 3.4; hgb 11.1; hct 34.4; mcv 86.9; plt 223      Review of Systems  Unable to perform ROS     Physical Exam  Constitutional: No distress.  frail  Neck: Neck supple. No JVD present.  Cardiovascular: Normal rate, regular rhythm and intact distal pulses.   Respiratory: Effort normal and breath sounds normal. No respiratory distress. He has no wheezes.  GI: Soft. Bowel sounds are normal. He exhibits no distension.  Genitourinary:  has foley  Musculoskeletal: He exhibits no  edema.  Has upper extremity contractures; does not move lower extremities.   Neurological: He is alert.  Skin: Skin is warm and dry. He is not diaphoretic.  Has penile skin tear Sacrum unstaged: 3.8 x 3.2 x 0 cm 95% slough present with 5% pink tissue present. No signs of infection present treated with santyl.       ASSESSMENT/ PLAN:  1. UTI with yeast: will complete  difucan for 4 more days will continue to monitor his status he does have a foley present. He is on d-mannose powder 2 gm twice daily and cranberry three times daily to help prevent uti's will monitor his status.   2. Pneumonia: will complete cipro and will monitor his status   3. Alzheimer's disease: is iwthout change in status; will continue namenda 10 mg twice daily and will monitor his status   4. Anemia: will continue iron daily   5. Parkinson's disease: no change in status; will continue neuropro patch 2 mg daily and will continue to monitor his status   6. Dyslipidemia: will continue lipitor 10 mg daily   7. Chronic pain: no indications of pain present; will continue tylenol 1 gm three times daily; will continue lidoderm patch to his lower back and will monitor his status  8. Constipation: will continue miralax daily   9. Dysphagia: no signs of aspiration present will continue therapy as directed and will monitor  10. Physical deconditioning: will continue therapy as directed; mvi; and nutritional support will monitor  11. Sacral decube: will continue treatment with santyl per facility protocol and will monitor   12. Hypothyroidism: will continue synthroid 25 mcg daily   13. Hypertension: will continue hydralazine 12.5 mg twice daily; asa 81 mg daily  if his blood pressure continues to remain elevated will need to increase this dose. Will monitor      Time spent with patient 50 minutes      Ok Edwards NP [facility]  Contact [phone] Monday through Friday 8am- 5pm  After hours call  [phone]

## 2014-03-31 NOTE — ED Notes (Addendum)
Pt. Is currently on IV abt, pt. Is for DISCHARGE , spoke  to pt.'s daughter,  Neno Hohensee and notify of pt.'s condition of not being accepted back to his facility [facility] for financial issues. Family requested pt. To be admitted for possible placement while 'insurance is on appeal" . MD notified. Hold for discharge until further order. Daughter's tel. No. [phone].

## 2014-04-01 NOTE — Progress Notes (Signed)
  CARE MANAGEMENT ED NOTE 04/01/2014  Patient:  MILT, COYE   Account Number:  [id number]  Date Initiated:  04/01/2014  Documentation initiated by:  Livia Snellen  Subjective/Objective Assessment:   Patient presents to Ed with pneumonia , UTI.  Patient was discharged from [facility] hospital on 05/12 and then sent to [facility] for rehab.     Subjective/Objective Assessment Detail:     Action/Plan:   Action/Plan Detail:   Anticipated DC Date:  04/01/2014     Status Recommendation to Physician:   Result of Recommendation:    Other ED Services  Consult [facility] [place]  CM consult  Other   [facility] Choice  HOME HEALTH   Choice offered to / List presented to:  C-4 Adult Children  DME arranged  [place]     DME agency  [place] arranged  HH-1 RN  [place]      [place] agency  Seth Ward    Status of service:    ED Comments:   ED Comments Detail:  [facility] discussed patient with EDP.  As per patinet's daughter Jefrey Raburn [phone], patient unable to return to [facility] due to insurance issues.  Patient's daughter reports that patient may return to his home where his son Reginold is currently living.  Patient's son is aware patient is returning home.  Patient 's daughter reports patient is currently active with [place] home health for RN, PT and speech therapy.Patient's daughter also reports patient has a personal CNA who comes everyday to assist patient with ADL's.  Patient's daughter reports patient has a walker, bedside commode and a shower chair at home. Patient's daughter is requesting a wheelchair, transfer chair, hoyer lift, hospital bed and wheelchair ramp for home.  EDCM explained to patient's daughter that [employer] will be delivering patient's equipment due to [employer]. Patient's daughter verbalized understanding.  [facility]  faxed home health orders to [facility] at [employer] with confirmation of receipt at 0058am for home health RN for wound care, PT, OT, aide, speech therapy, and social worker for [facility] assistance.   [facility] faxed DME orders to [place] for transfer chair, ramp, hoyer lift, wheelchair and hospital bed at phone number [phone] at 0104am with confirmation of receipt at 0113am.  Patient's daughter is aware that patient will be requireing twenty-four hour care.  EDCM recommended calling patient's private aide to see if they can come more often and stay longer.  Patient's daughter agreeable to this plan.  Patient's daughter is aware of the senior resources of [place] and refused private duty nursing due to cost.  Patient does not qualify for [facility] due to insurance.  EDCM asked patient's daughter if there was anyone else who needed to be called, patient's daughter replied, "No."  EDCM confirmed patient's correct address and phone number to reach patient.  Patient's daughter Quang Thorpe would like to be contacted first as she is the general guardian of the patient.  Patient's daughter thankful for services.  No further EDCM needs at this time.

## 2014-04-01 NOTE — ED Provider Notes (Signed)
Medical screening examination/treatment/procedure(s) were conducted as a shared visit with non-physician practitioner(s) and myself.  I personally evaluated the patient during the encounter.   EKG Interpretation   Date/Time:  Friday Mar 31 2014 18:50:18 EDT Ventricular Rate:  72 PR Interval:  156 QRS Duration: 155 QT Interval:  456 QTC Calculation: 499 R Axis:   0 Text Interpretation:  Sinus rhythm Right bundle branch block Left  ventricular hypertrophy Artifact in lead(s) I II III aVR aVL aVF V1 V2 V3  V4 No significant change since last tracing Confirmed by POLLINA  MD,  CHRISTOPHER [id number]) on 03/31/2014 7:27:03 PM      Patient sent to the emergency department for evaluation of fever. Patient was hospitalized this past week for a urinary tract infection and pneumonia. Discharged 3 days ago. Patient may have persistent pneumonia on x-ray, but clinically he is comfortable. He is not hypoxic, is breathing comfortably. Will change from Cipro to Rocephin for better coverage, can continue [facility] as an outpatient. He does not require repeat hospitalization for the radiologic findings. Urinalysis was clear today.  Express concern about discharging the patient because he was about to run out of his insurance coverage for the nursing home tomorrow. He does not, however, have the medical necessity for admission. Family was informed that he could not be admitted for social purposes. Case management involved in this aspect of the case.  Orpah Greek, MD 04/01/14 1106

## 2014-04-02 LAB — URINE CULTURE

## 2014-04-03 ENCOUNTER — Ambulatory Visit: Payer: Medicare HMO | Admitting: Internal Medicine

## 2014-04-03 ENCOUNTER — Telehealth (HOSPITAL_BASED_OUTPATIENT_CLINIC_OR_DEPARTMENT_OTHER): Payer: Self-pay | Admitting: Emergency Medicine

## 2014-04-03 NOTE — Telephone Encounter (Signed)
Post ED Visit - Positive Culture Follow-up  Culture report reviewed by antimicrobial stewardship pharmacist: []  Wes Edgerton, Pharm.D., BCPS []  Heide Guile, Pharm.D., BCPS []  Alycia Rossetti, Pharm.D., BCPS [x]  [place], Pharm.D., BCPS, AAHIVP []  Legrand Como, Pharm.D., BCPS, AAHIVP []  Juliene Pina, Pharm.D.  Positive urine culture Per [facility] NP, no treatment needed and no further patient follow-up is required at this time.  Myrna Blazer 04/03/2014, 2:34 PM

## 2014-04-04 DIAGNOSIS — L8995 Pressure ulcer of unspecified site, unstageable: Secondary | ICD-10-CM

## 2014-04-04 DIAGNOSIS — J690 Pneumonitis due to inhalation of food and vomit: Secondary | ICD-10-CM

## 2014-04-04 DIAGNOSIS — L89109 Pressure ulcer of unspecified part of back, unspecified stage: Secondary | ICD-10-CM

## 2014-04-04 DIAGNOSIS — T83511A Infection and inflammatory reaction due to indwelling urethral catheter, initial encounter: Secondary | ICD-10-CM

## 2014-04-05 ENCOUNTER — Telehealth: Payer: Self-pay | Admitting: *Deleted

## 2014-04-05 NOTE — Progress Notes (Signed)
[facility] ED CM called to follow up on referrals to [facility].  57 spoke with Bethena Roys at [facility] who states pt DME orders/faxes received, processed and pending [facility] Medicare authorization.  Dale at [facility] states pt's home services resumed 1200 Pt's caregiver, [name] states the pt has not received DME at this time but has received home health services.  Reports [facility] only "leases" DME and requires credit card funds for 20% deductible and POA, pt's daughter, only able to provide "checks not credit card" and will address DME concerns when she "returns from out of town" with suggested (suggested by "[facility]. CM left contact information if further questions

## 2014-04-05 NOTE — Telephone Encounter (Signed)
Ginger with [employer] called and stated that with the patient's debility, son is having trouble taking care of him without the right equipment. Requesting a Hospital Bed with Extension (due to Height), Youth worker. Wants it faxed to [place]. I called [facility] # [phone] and spoke with Joseph Art and she stated to fax an order to them Fax# [phone]. I filled out order and gave to Hassell Done for signature.

## 2014-04-12 ENCOUNTER — Encounter (HOSPITAL_COMMUNITY): Payer: Self-pay | Admitting: Emergency Medicine

## 2014-04-12 ENCOUNTER — Encounter: Payer: Self-pay | Admitting: Internal Medicine

## 2014-04-12 ENCOUNTER — Emergency Department (HOSPITAL_COMMUNITY)
Admission: EM | Admit: 2014-04-12 | Discharge: 2014-04-12 | Disposition: A | Discharge status: Home / Self Care | Payer: Medicare HMO | Attending: Emergency Medicine | Admitting: Emergency Medicine

## 2014-04-12 ENCOUNTER — Ambulatory Visit (HOSPITAL_COMMUNITY): Payer: Medicare HMO

## 2014-04-12 ENCOUNTER — Emergency Department (HOSPITAL_COMMUNITY): Payer: Medicare HMO

## 2014-04-12 DIAGNOSIS — Z872 Personal history of diseases of the skin and subcutaneous tissue: Secondary | ICD-10-CM | POA: Insufficient documentation

## 2014-04-12 DIAGNOSIS — Z8673 Personal history of transient ischemic attack (TIA), and cerebral infarction without residual deficits: Secondary | ICD-10-CM | POA: Insufficient documentation

## 2014-04-12 DIAGNOSIS — Z8659 Personal history of other mental and behavioral disorders: Secondary | ICD-10-CM | POA: Insufficient documentation

## 2014-04-12 DIAGNOSIS — R4182 Altered mental status, unspecified: Secondary | ICD-10-CM | POA: Insufficient documentation

## 2014-04-12 DIAGNOSIS — Z8701 Personal history of pneumonia (recurrent): Secondary | ICD-10-CM | POA: Insufficient documentation

## 2014-04-12 DIAGNOSIS — Z8709 Personal history of other diseases of the respiratory system: Secondary | ICD-10-CM | POA: Insufficient documentation

## 2014-04-12 DIAGNOSIS — Z79899 Other long term (current) drug therapy: Secondary | ICD-10-CM | POA: Insufficient documentation

## 2014-04-12 DIAGNOSIS — R627 Adult failure to thrive: Secondary | ICD-10-CM | POA: Insufficient documentation

## 2014-04-12 DIAGNOSIS — Z87891 Personal history of nicotine dependence: Secondary | ICD-10-CM | POA: Insufficient documentation

## 2014-04-12 DIAGNOSIS — F0280 Dementia in other diseases classified elsewhere without behavioral disturbance: Secondary | ICD-10-CM | POA: Insufficient documentation

## 2014-04-12 DIAGNOSIS — G20A1 Parkinson's disease without dyskinesia, without mention of fluctuations: Secondary | ICD-10-CM | POA: Insufficient documentation

## 2014-04-12 DIAGNOSIS — Z791 Long term (current) use of non-steroidal anti-inflammatories (NSAID): Secondary | ICD-10-CM | POA: Insufficient documentation

## 2014-04-12 DIAGNOSIS — G20 Parkinson's disease: Secondary | ICD-10-CM | POA: Insufficient documentation

## 2014-04-12 DIAGNOSIS — Z7982 Long term (current) use of aspirin: Secondary | ICD-10-CM | POA: Insufficient documentation

## 2014-04-12 DIAGNOSIS — I10 Essential (primary) hypertension: Secondary | ICD-10-CM | POA: Insufficient documentation

## 2014-04-12 DIAGNOSIS — E039 Hypothyroidism, unspecified: Secondary | ICD-10-CM | POA: Insufficient documentation

## 2014-04-12 DIAGNOSIS — Z8619 Personal history of other infectious and parasitic diseases: Secondary | ICD-10-CM | POA: Insufficient documentation

## 2014-04-12 DIAGNOSIS — Z87440 Personal history of urinary (tract) infections: Secondary | ICD-10-CM | POA: Insufficient documentation

## 2014-04-12 DIAGNOSIS — Z8719 Personal history of other diseases of the digestive system: Secondary | ICD-10-CM | POA: Insufficient documentation

## 2014-04-12 DIAGNOSIS — R197 Diarrhea, unspecified: Secondary | ICD-10-CM | POA: Insufficient documentation

## 2014-04-12 DIAGNOSIS — M1990 Unspecified osteoarthritis, unspecified site: Secondary | ICD-10-CM | POA: Insufficient documentation

## 2014-04-12 DIAGNOSIS — Z87448 Personal history of other diseases of urinary system: Secondary | ICD-10-CM | POA: Insufficient documentation

## 2014-04-12 DIAGNOSIS — G309 Alzheimer's disease, unspecified: Secondary | ICD-10-CM | POA: Insufficient documentation

## 2014-04-12 DIAGNOSIS — D649 Anemia, unspecified: Secondary | ICD-10-CM | POA: Insufficient documentation

## 2014-04-12 LAB — CBC WITH DIFFERENTIAL/PLATELET
Basophils Absolute: 0 10*3/uL (ref 0.0–0.1)
Basophils Relative: 0 % (ref 0–1)
Eosinophils Absolute: 0 10*3/uL (ref 0.0–0.7)
Eosinophils Relative: 0 % (ref 0–5)
HCT: 35.9 % — ABNORMAL LOW (ref 39.0–52.0)
Hemoglobin: 11.4 g/dL — ABNORMAL LOW (ref 13.0–17.0)
Lymphocytes Relative: 7 % — ABNORMAL LOW (ref 12–46)
Lymphs Abs: 0.9 10*3/uL (ref 0.7–4.0)
MCH: 27.9 pg (ref 26.0–34.0)
MCHC: 31.8 g/dL (ref 30.0–36.0)
MCV: 87.8 fL (ref 78.0–100.0)
Monocytes Absolute: 0.8 10*3/uL (ref 0.1–1.0)
Monocytes Relative: 7 % (ref 3–12)
Neutro Abs: 11.2 10*3/uL — ABNORMAL HIGH (ref 1.7–7.7)
Neutrophils Relative %: 86 % — ABNORMAL HIGH (ref 43–77)
Platelets: 213 10*3/uL (ref 150–400)
RBC: 4.09 MIL/uL — ABNORMAL LOW (ref 4.22–5.81)
RDW: 14.5 % (ref 11.5–15.5)
WBC: 12.9 10*3/uL — ABNORMAL HIGH (ref 4.0–10.5)

## 2014-04-12 LAB — URINALYSIS, ROUTINE W REFLEX MICROSCOPIC
Bilirubin Urine: NEGATIVE
Glucose, UA: NEGATIVE mg/dL
Ketones, ur: NEGATIVE mg/dL
Nitrite: NEGATIVE
Protein, ur: 30 mg/dL — AB
Specific Gravity, Urine: 1.011 (ref 1.005–1.030)
Urobilinogen, UA: 0.2 mg/dL (ref 0.0–1.0)
pH: 7 (ref 5.0–8.0)

## 2014-04-12 LAB — COMPREHENSIVE METABOLIC PANEL
ALT: 25 U/L (ref 0–53)
AST: 35 U/L (ref 0–37)
Albumin: 3.7 g/dL (ref 3.5–5.2)
Alkaline Phosphatase: 89 U/L (ref 39–117)
BUN: 20 mg/dL (ref 6–23)
CO2: 24 mEq/L (ref 19–32)
Calcium: 9.8 mg/dL (ref 8.4–10.5)
Chloride: 99 mEq/L (ref 96–112)
Creatinine, Ser: 1.13 mg/dL (ref 0.50–1.35)
GFR calc Af Amer: 62 mL/min — ABNORMAL LOW (ref >90)
GFR calc non Af Amer: 54 mL/min — ABNORMAL LOW (ref >90)
Glucose, Bld: 114 mg/dL — ABNORMAL HIGH (ref 70–99)
Potassium: 4.9 mEq/L (ref 3.7–5.3)
Sodium: 138 mEq/L (ref 137–147)
Total Bilirubin: 0.5 mg/dL (ref 0.3–1.2)
Total Protein: 8.2 g/dL (ref 6.0–8.3)

## 2014-04-12 LAB — LACTIC ACID, PLASMA: Lactic Acid, Venous: 3.4 mmol/L — ABNORMAL HIGH (ref 0.5–2.2)

## 2014-04-12 LAB — URINE MICROSCOPIC-ADD ON

## 2014-04-12 LAB — TROPONIN I: Troponin I: <0.3 ng/mL (ref <0.30)

## 2014-04-12 MED ORDER — SODIUM CHLORIDE 0.9 % IV BOLUS (SEPSIS)
500.0000 mL | Freq: Once | INTRAVENOUS | Status: AC
  Administered 2014-04-12: 500 mL via INTRAVENOUS

## 2014-04-12 MED ORDER — SODIUM CHLORIDE 0.9 % IV SOLN
INTRAVENOUS | Status: DC
  Administered 2014-04-12: 500 mL via INTRAVENOUS

## 2014-04-12 NOTE — ED Notes (Signed)
Portable xray performed

## 2014-04-12 NOTE — Discharge Instructions (Signed)
Altered Mental Status °Altered mental status most often refers to an abnormal change in your responsiveness and awareness. It can affect your speech, thought, mobility, memory, attention span, or alertness. It can range from slight confusion to complete unresponsiveness (coma). Altered mental status can be a sign of a serious underlying medical condition. Rapid evaluation and medical treatment is necessary for patients having an altered mental status. °CAUSES  °· Low blood sugar (hypoglycemia) or diabetes. °· Severe loss of body fluids (dehydration) or a body salt (electrolyte) imbalance. °· A stroke or other neurologic problem, such as dementia or delirium. °· A head injury or tumor. °· A drug or alcohol overdose. °· Exposure to toxins or poisons. °· Depression, anxiety, and stress. °· A low oxygen level (hypoxia). °· An infection. °· Blood loss. °· Twitching or shaking (seizure). °· Heart problems, such as heart attack or heart rhythm problems (arrhythmias). °· A body temperature that is too low or too high (hypothermia or hyperthermia). °DIAGNOSIS  °A diagnosis is based on your history, symptoms, physical and neurologic examinations, and diagnostic tests. Diagnostic tests may include: °· Measurement of your blood pressure, pulse, breathing, and oxygen levels (vital signs). °· Blood tests. °· Urine tests. °· X-ray exams. °· A computerized magnetic scan (magnetic resonance imaging, MRI). °· A computerized X-ray scan (computed tomography, CT scan). °TREATMENT  °Treatment will depend on the cause. Treatment may include: °· Management of an underlying medical or mental health condition. °· Critical care or support in the hospital. °HOME CARE INSTRUCTIONS  °· Only take over-the-counter or prescription medicines for pain, discomfort, or fever as directed by your caregiver. °· Manage underlying conditions as directed by your caregiver. °· Eat a healthy, well-balanced diet to maintain strength. °· Join a support group or  prevention program to cope with the condition or trauma that caused the altered mental status. Ask your caregiver to help choose a program that works for you. °· Follow up with your caregiver for further examination, therapy, or testing as directed. °SEEK MEDICAL CARE IF:  °· You feel unwell or have chills. °· You or your family notice a change in your behavior or your alertness. °· You have trouble following your caregiver's treatment plan. °· You have questions or concerns. °SEEK IMMEDIATE MEDICAL CARE IF:  °· You have a rapid heartbeat or have chest pain. °· You have difficulty breathing. °· You have a fever. °· You have a headache with a stiff neck. °· You cough up blood. °· You have blood in your urine or stool. °· You have severe agitation or confusion. °MAKE SURE YOU:  °· Understand these instructions. °· Will watch your condition. °· Will get help right away if you are not doing well or get worse. °Document Released: 04/23/2010 Document Revised: 01/26/2012 Document Reviewed: 04/23/2010 °ExitCare® Patient Information ©2014 [employer]. ° °

## 2014-04-12 NOTE — ED Notes (Signed)
Family at bedside. 

## 2014-04-12 NOTE — ED Notes (Signed)
Per [facility]- Lives with Son- Continued decline in health. Pt has foley. Pt chronic UTI. Recent HX of pneumonia. Recent hospitalization. Recent decrease in oral intake. Diarrhea x 1 day. Denies N/V.

## 2014-04-12 NOTE — ED Notes (Signed)
MD at bedside. 

## 2014-04-12 NOTE — ED Notes (Signed)
Patient transported to CT 

## 2014-04-12 NOTE — Progress Notes (Signed)
  CARE MANAGEMENT ED NOTE 04/12/2014  Patient:  EDD, REPPERT   Account Number:  [id number]  Date Initiated:  04/12/2014  Documentation initiated by:  Jackelyn Poling  Subjective/Objective Assessment:   80 yr old [place] covered [place] pt Lives with Son- Continued decline in health. Pt has foley. Pt chronic UTI. Recent HX of pneumonia. Recent hospitalization. Recent decrease in oral intake. Diarrhea x 1 day. Denies N/V     Subjective/Objective Assessment Detail:   4 Admissions and 3 ED visits in last 6 months Previously at [facility] Son reports pt has been home for 10 days and [employer] reported they "did not have enough" information to approve "further stay at [facility]"  Son [name] supportive previous lawyer confirms pcp as Mila Palmer Reviewed with CM pt hx from [facility] admission to present [facility] ED visit including increased loose stools on sacral wound in last few days Son requesting "Dr Rockne Menghini" to be involved with pt care  Daughter POA lives in [place] and is presently "sick"  Followed by [facility], OT/PT/aide/SW as initiated on 04/01/14 Last seen by [facility] on 04/10/14 Son reports seen by [facility] aide twice a week  Son confirms DME (ordered & processed from 04/01/14 ED visit) has not been obtained from [place] has not proceeded to get DME from [facility] POA will not provide credit card for processing only check     Action/Plan:   ED Cm spoke with Dr Wilson Singer about concerns with pt, criteria for admission after reviewed EPIC labs & imaging Cm Reviewed EPIC labs imaging CM spoke with son &   Action/Plan Detail:   CM updated EDP, [place] & request EDP speak with son about results of labs/imaging Cm inquired about resolution of DME.  CM provided son with CM office #.  CM explained to son d/c options if admitted & if d/c home.   Anticipated DC Date:  04/12/2014     Status Recommendation to Physician:   Result of Recommendation:    Other ED  [place]  Other  Outpatient Services - Pt will follow up   [place]   Choice offered to / List presented to:  C-4 Adult Children     DME agency  [place] arranged  HH-1 RN  [place]      [place] agency  [place]    Status of service:  Completed, signed off  ED Comments:   ED Comments Detail:  CM informed son after he states "he would see how it goes" in response to his anticipated disposition for pt, that if pt is admitted and meet Medicare guidelines, 3 day qualifying stay, SW can assist with placement CM informed son if pt is not admitted, the pcp along with HHSW can assist with facility placement if he and POA confirm this is the preferred disposition Discussed FL2 will need completion by pcp & SW for snf facilities Son voiced understanding

## 2014-04-13 ENCOUNTER — Ambulatory Visit (INDEPENDENT_AMBULATORY_CARE_PROVIDER_SITE_OTHER): Payer: Medicare HMO | Admitting: Nurse Practitioner

## 2014-04-13 ENCOUNTER — Encounter: Payer: Self-pay | Admitting: Nurse Practitioner

## 2014-04-13 VITALS — BP 142/82 | HR 75 | Temp 99.4°F | Resp 18

## 2014-04-13 DIAGNOSIS — L8995 Pressure ulcer of unspecified site, unstageable: Secondary | ICD-10-CM

## 2014-04-13 DIAGNOSIS — J189 Pneumonia, unspecified organism: Secondary | ICD-10-CM

## 2014-04-13 DIAGNOSIS — D638 Anemia in other chronic diseases classified elsewhere: Secondary | ICD-10-CM

## 2014-04-13 DIAGNOSIS — F0280 Dementia in other diseases classified elsewhere without behavioral disturbance: Secondary | ICD-10-CM

## 2014-04-13 DIAGNOSIS — N400 Enlarged prostate without lower urinary tract symptoms: Secondary | ICD-10-CM

## 2014-04-13 DIAGNOSIS — R1310 Dysphagia, unspecified: Secondary | ICD-10-CM

## 2014-04-13 DIAGNOSIS — R509 Fever, unspecified: Secondary | ICD-10-CM

## 2014-04-13 DIAGNOSIS — G309 Alzheimer's disease, unspecified: Secondary | ICD-10-CM

## 2014-04-13 DIAGNOSIS — L89109 Pressure ulcer of unspecified part of back, unspecified stage: Secondary | ICD-10-CM

## 2014-04-13 DIAGNOSIS — L89150 Pressure ulcer of sacral region, unstageable: Secondary | ICD-10-CM

## 2014-04-13 DIAGNOSIS — N182 Chronic kidney disease, stage 2 (mild): Secondary | ICD-10-CM

## 2014-04-13 DIAGNOSIS — R627 Adult failure to thrive: Secondary | ICD-10-CM

## 2014-04-13 NOTE — Progress Notes (Signed)
Patient ID: Ricky [name], male   DOB: 04/26/1917, 80 y.o.   MRN: [id number]    Allergies  Allergen Reactions  . Other Other (See Comments)    Dairy products- cause swallowing difficulty     Chief Complaint  Patient presents with  . Medical Management of Chronic Issues    HPI: Patient is a 80 y.o. male here today with son seen in the office today for follow up ED visit. Pt of Dr Mariea Clonts with past medical history of dementia, parkisonson's, chronic indwelling foley catheter with recurrent hospitalizations for sepsis/UTI/pneumonia (4 admissions this year) recently was hospitalized and went to [facility] for rehab however insurance was denying payment and pt was to be dc'd home per son however due to  24 hours of elevated temperature, ongoing treatment for UTI and HCAP pt was sent back to the ED for evaluation.  Pt was treated in ED and then sent home with medication and home health. DME was pending- unable to obtain at this time, working with facilities to get this.  Completed treatment, had bad diarrhea- got better then had bad diarrhea episodes night before last- today he is eating drinking better and without diarrhea   Yesterday son went in and was unable to arouse him- called 911 took him to ED with extensive workup without any abnormal finding. Still having fever. Nursing coming to home twice a week for wound on sacrum- still using santyl and wound without infection and witout any other skin issues.  Urine culture pending from hospital. No cough or congestion.  pts family is trying him to skilled facility but insurance is refusing to allow him to cont skill care (And family reports insurance would not approve him be hospitalized at one point)  Would like wound care clinic referral due to worsening wound on sacrum- have already gotten appt but needs referal  Review of Systems:   Review of Systems  Unable to perform ROS: dementia     Past Medical History  Diagnosis Date  . IBS  (irritable bowel syndrome)   . Diverticulosis   . Cataract   . OAB (overactive bladder)   . Macular degeneration   . Alzheimer disease   . Hypertension   . Arthritis   . Osteoporosis   . Parkinsonian syndrome   . Syncope, vasovagal   . Neuromuscular disorder     parkisonian syndrome  . 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 Unspecified transient cerebral ischemia   . Unspecified urinary incontinence   . Senile dementia, uncomplicated   . Unspecified constipation   . Hypothyroidism   . Anemia, unspecified   . Depression   . Alzheimer's disease   . Gout, unspecified   . Thoracic or lumbosacral neuritis or radiculitis, unspecified   . Progressive supranuclear palsy   . Unspecified transient cerebral ischemia   . Unspecified urinary incontinence   . Syncope and collapse   . Paralysis agitans   . Unspecified transient cerebral ischemia   . Pneumonitis due to inhalation of food or vomitus   . Cellulitis and abscess of oral soft tissues   . Screening for lipoid disorders   . Senile dementia, uncomplicated   . Unspecified constipation   . Disorder of bone and cartilage, unspecified   . Other specified acquired hypothyroidism   . Unspecified hypothyroidism   . Anemia, unspecified   . Depressive disorder, not elsewhere classified   . Alzheimer's disease   . Paralysis agitans   . Pneumonia,  organism unspecified   . Pseudomonas infection in conditions classified elsewhere and of unspecified site   . Unspecified essential hypertension   . Urinary tract infection, site not specified   . Osteoarthrosis, unspecified whether generalized or localized, unspecified site   . Osteoporosis, unspecified   . Gout, unspecified   . Thoracic or lumbosacral neuritis or radiculitis, unspecified   . Stroke    Past Surgical History  Procedure Laterality Date  . Esophagogastroduodenoscopy    . Eye surgery  2006    LEFT CATARACT   Social History:   reports  that he has quit smoking. He has never used smokeless tobacco. He reports that he does not drink alcohol or use illicit drugs.  Family History  Problem Relation Age of Onset  . Cancer Mother   . Stroke Father   . Liver disease Father   . Kidney disease Brother   . Liver disease Brother   . Emphysema Brother     Medications: Patient's Medications  New Prescriptions   No medications on file  Previous Medications   ACETAMINOPHEN (TYLENOL) 500 MG TABLET    Take 1,000 mg by mouth 3 (three) times daily.    ASPIRIN EC 325 MG EC TABLET    Take 1 tablet (325 mg total) by mouth daily.   ATORVASTATIN (LIPITOR) 10 MG TABLET    Take 1 tablet (10 mg total) by mouth daily at 6 PM.   CALCIUM CARBONATE-VIT D-MIN (CALTRATE 600+D PLUS) 600-800 MG-UNIT CHEW    Chew 1 tablet by mouth 2 (two) times daily with a meal.    CLOTRIMAZOLE-BETAMETHASONE (LOTRISONE) CREAM    as needed.   CRANBERRY PO    Take 400 mg by mouth 3 (three) times daily.    CYANOCOBALAMIN (VITAMIN B-12) 2500 MCG SUBL    Place 5,000 mcg under the tongue daily.    D-MANNOSE POWD    Take 2,000 mg by mouth 2 (two) times daily.   FERROUS SULFATE 325 (65 FE) MG TABLET    Take 325 mg by mouth every other day.    HYDRALAZINE (APRESOLINE) 25 MG TABLET    Take 12.5 mg by mouth 2 (two) times daily.   LACTOSE FREE NUTRITION (BOOST) LIQD    Take 90 mLs by mouth 2 (two) times daily between meals.    LEVOTHYROXINE (SYNTHROID, LEVOTHROID) 25 MCG TABLET    Take 25 mcg by mouth daily before breakfast.   MELATONIN 3 MG CAPS    Take 6 mg by mouth at bedtime as needed (sleep).   MELOXICAM (MOBIC) 7.5 MG TABLET    Take 7.5 mg by mouth daily.   MEMANTINE (NAMENDA) 10 MG TABLET    Take 10 mg by mouth 2 (two) times daily.   MULTIPLE VITAMIN (MULTIVITAMIN WITH MINERALS) TABS    Take 1 tablet by mouth daily.   NEUPRO 2 MG/24HR    Place 1 patch onto the skin daily.    POLYETHYLENE GLYCOL (MIRALAX / GLYCOLAX) PACKET    Take 17 g by mouth daily.   PROBIOTIC PRODUCT  (PROBIOTIC DAILY) CAPS    Take 1 capsule by mouth daily.   SANTYL OINTMENT    Apply topically daily.  Modified Medications   No medications on file  Discontinued Medications   No medications on file     Physical Exam:  Filed Vitals:   04/13/14 1442  BP: 142/82  Pulse: 75  Temp: 99.4 F (37.4 C)  TempSrc: Oral  Resp: 18  SpO2: 94%  Physical Exam  Constitutional: No distress.  Thin frail male in NAD, hunched over in [facility], does not provide any information, son and daughter providing all history   HENT:  Mouth/Throat: Oropharynx is clear and moist. No oropharyngeal exudate.  Neck: Normal range of motion. Neck supple.  Cardiovascular: Normal rate, regular rhythm and normal heart sounds.   Pulmonary/Chest: Effort normal.  Diminished Breath Sounds throughout  Abdominal: Soft. Bowel sounds are normal.  Musculoskeletal: He exhibits no edema and no tenderness.  Neurological: He is alert.  Skin: Skin is warm and dry. He is not diaphoretic.  Sacrum unstaged: 3.8 x 3.2 x 0 cm 95% slough present with 5% pink tissue present. No signs of infection present and currently being treated with santyl.          Labs reviewed: Basic Metabolic Panel:  Recent Labs  01/15/14 1850  03/08/14 2259  03/21/14 0312  03/25/14 0420 03/27/14 0353 03/31/14 1900 04/12/14 0915  NA  --   < > 138  < > 137  < > 138  --  139 138  K  --   < > 4.4  < > 3.5*  < > 4.2  --  4.2 4.9  CL  --   < > 100  < > 99  < > 105  --  102 99  CO2  --   < > 25  < > 25  < > 22  --  24 24  GLUCOSE  --   < > 91  < > 107*  < > 92  --  101* 114*  BUN  --   < > 11  < > 9  < > 8  --  17 20  CREATININE  --   < > 0.82  < > 0.80  < > 0.65 0.72 0.72 1.13  CALCIUM  --   < > 9.2  < > 8.9  < > 9.0  --  9.5 9.8  TSH 0.840  --  1.490  --  0.804  --   --   --   --   --   < > = values in this interval not displayed. Liver Function Tests:  Recent Labs  03/08/14 2259 03/31/14 1900 04/12/14 0915  AST 20 21 35  ALT 13 15 25     ALKPHOS 78 96 89  BILITOT 0.3 0.4 0.5  PROT 7.9 8.1 8.2  ALBUMIN 3.6 3.5 3.7   No results found for this basename: LIPASE, AMYLASE,  in the last 8760 hours No results found for this basename: AMMONIA,  in the last 8760 hours CBC:  Recent Labs  03/20/14 1635  03/25/14 0420 03/31/14 1900 04/12/14 0915  WBC 8.8  < > 3.4* 6.6 12.9*  NEUTROABS 7.3  --   --  4.5 11.2*  HGB 11.5*  < > 11.1* 11.8* 11.4*  HCT 34.9*  < > 34.4* 35.7* 35.9*  MCV 87.5  < > 86.9 88.1 87.8  PLT 225  < > 223 213 213  < > = values in this interval not displayed. Lipid Panel: No results found for this basename: CHOL, HDL, LDLCALC, TRIG, CHOLHDL, LDLDIRECT,  in the last 8760 hours TSH:  Recent Labs  01/15/14 1850 03/08/14 2259 03/21/14 0312  TSH 0.840 1.490 0.804    CLINICAL DATA: Failure to thrive. Unresponsive.  EXAM:  PORTABLE CHEST - 1 VIEW  04/12/14 COMPARISON: 03/31/2014  FINDINGS:  Cardiac silhouette is normal in size. Normal mediastinal and hilar  contours. Areas  of mild reticular and reticular nodular type lung  opacity are stable. There are no areas of lung consolidation to  suggest pneumonia. No pulmonary edema. No pleural effusion or  pneumothorax.  Bony thorax is demineralized but grossly intact.  IMPRESSION:  No acute cardiopulmonary disease.    Assessment/Plan 1. FTT (failure to thrive) in adult - with worsening debility, son providing around the clock care for father.  -sister is POA -family realizes care is becoming a lot for them to handle without more home care and assistance -looking into SNF and DME at this time -FL2 completed today  2. Fever -low grade, conts on tylenol as needed, will follow up CBC With differential/Platelet -urine culture pending, without acute findings in ED on 5/27  3. Anemia, chronic disease -conts on iron, will follow up cbc  4. CKD (chronic kidney disease), stage II - Basic metabolic panel  5. BPH (benign prostatic hyperplasia) -conts  with indwelling foley catheter   6. Decubitus ulcer of sacral region, unstageable -has been stable, no improvement -instruction and education provided on pressure reduction and nutrition  -cont santyl and dsg changes -conts with [place] - AMB referral to wound care center  7. ALZHEIMERS DISEASE -advanced, cont aricept and namenda   8. HCAP (healthcare-associated pneumonia) -has competed treatment, xray with no acute process  9. Dysphagia -last swallow eval in hospital -family informed of results   Greater than 45 mins total time; greater than 50% of total time spent doing pt counseled and coordination of care regarding plan of care, forms filled out, educations re chronic conditions

## 2014-04-14 LAB — CBC WITH DIFFERENTIAL
BASOS ABS: 0 10*3/uL (ref 0.0–0.2)
Basos: 0 %
Eos: 2 %
Eosinophils Absolute: 0.1 10*3/uL (ref 0.0–0.4)
HEMATOCRIT: 32.9 % — AB (ref 37.5–51.0)
Hemoglobin: 10.5 g/dL — ABNORMAL LOW (ref 12.6–17.7)
IMMATURE GRANULOCYTES: 0 %
Immature Grans (Abs): 0 10*3/uL (ref 0.0–0.1)
Lymphocytes Absolute: 1.5 10*3/uL (ref 0.7–3.1)
Lymphs: 19 %
MCH: 27.3 pg (ref 26.6–33.0)
MCHC: 31.9 g/dL (ref 31.5–35.7)
MCV: 86 fL (ref 79–97)
MONOCYTES: 16 %
MONOS ABS: 1.3 10*3/uL — AB (ref 0.1–0.9)
NEUTROS ABS: 5.2 10*3/uL (ref 1.4–7.0)
Neutrophils Relative %: 63 %
PLATELETS: 235 10*3/uL (ref 150–379)
RBC: 3.85 x10E6/uL — ABNORMAL LOW (ref 4.14–5.80)
RDW: 14.5 % (ref 12.3–15.4)
WBC: 8.3 10*3/uL (ref 3.4–10.8)

## 2014-04-14 LAB — BASIC METABOLIC PANEL
BUN / CREAT RATIO: 17 (ref 10–22)
BUN: 14 mg/dL (ref 10–36)
CHLORIDE: 99 mmol/L (ref 97–108)
CO2: 22 mmol/L (ref 18–29)
Calcium: 9.2 mg/dL (ref 8.6–10.2)
Creatinine, Ser: 0.84 mg/dL (ref 0.76–1.27)
GFR calc non Af Amer: 75 mL/min/{1.73_m2} (ref >59)
GFR, EST AFRICAN AMERICAN: 87 mL/min/{1.73_m2} (ref >59)
Glucose: 86 mg/dL (ref 65–99)
Potassium: 4 mmol/L (ref 3.5–5.2)
SODIUM: 138 mmol/L (ref 134–144)

## 2014-04-17 ENCOUNTER — Telehealth: Payer: Self-pay | Admitting: *Deleted

## 2014-04-17 NOTE — Telephone Encounter (Signed)
Patient daughter [name] called and stated that they need an order for Hydration Tubing to give to [facility] for patient to have. Please Advise.

## 2014-04-17 NOTE — Telephone Encounter (Signed)
Ricky [name] called and stated that if patient is not eating or drinking he needs to go to Hospital otherwise if [place] wants an order for them to fax what they are requesting.

## 2014-04-17 NOTE — Telephone Encounter (Signed)
LMOM for [name] to return call.

## 2014-04-17 NOTE — ED Provider Notes (Signed)
CSN: [id number]     Arrival date & time 04/12/14  0865 History   First MD Initiated Contact with Patient 04/12/14 0845     Chief Complaint  Patient presents with  . Failure To Thrive  . Fatigue  . Diarrhea  . Urinary Tract Infection     (Consider location/radiation/quality/duration/timing/severity/associated sxs/prior Treatment) HPI  89yM accompanied by son. Decreased mental status over past couple days. Less eye contact. Seems sleepy. Not smiling as much. Decreased oral intake. No reported fever. Has chronic foley. Last changed ~3 weeks ago. No trauma. No recent med changes. Loose stools since yesterday.   Past Medical History  Diagnosis Date  . IBS (irritable bowel syndrome)   . Diverticulosis   . Cataract   . OAB (overactive bladder)   . Macular degeneration   . Alzheimer disease   . Hypertension   . Arthritis   . Osteoporosis   . Parkinsonian syndrome   . Syncope, vasovagal   . Neuromuscular disorder     parkisonian syndrome  . 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 Unspecified transient cerebral ischemia   . Unspecified urinary incontinence   . Senile dementia, uncomplicated   . Unspecified constipation   . Hypothyroidism   . Anemia, unspecified   . Depression   . Alzheimer's disease   . Gout, unspecified   . Thoracic or lumbosacral neuritis or radiculitis, unspecified   . Progressive supranuclear palsy   . Unspecified transient cerebral ischemia   . Unspecified urinary incontinence   . Syncope and collapse   . Paralysis agitans   . Unspecified transient cerebral ischemia   . Pneumonitis due to inhalation of food or vomitus   . Cellulitis and abscess of oral soft tissues   . Screening for lipoid disorders   . Senile dementia, uncomplicated   . Unspecified constipation   . Disorder of bone and cartilage, unspecified   . Other specified acquired hypothyroidism   . Unspecified hypothyroidism   . Anemia, unspecified   .  Depressive disorder, not elsewhere classified   . Alzheimer's disease   . Paralysis agitans   . Pneumonia, organism unspecified   . Pseudomonas infection in conditions classified elsewhere and of unspecified site   . Unspecified essential hypertension   . Urinary tract infection, site not specified   . Osteoarthrosis, unspecified whether generalized or localized, unspecified site   . Osteoporosis, unspecified   . Gout, unspecified   . Thoracic or lumbosacral neuritis or radiculitis, unspecified   . Stroke    Past Surgical History  Procedure Laterality Date  . Esophagogastroduodenoscopy    . Eye surgery  2006    LEFT CATARACT   Family History  Problem Relation Age of Onset  . Cancer Mother   . Stroke Father   . Liver disease Father   . Kidney disease Brother   . Liver disease Brother   . Emphysema Brother    History  Substance Use Topics  . Smoking status: Former Research scientist (life sciences)  . Smokeless tobacco: Never Used     Comment: QUIT SMOKING BACK IN THE LATE 50'S  . Alcohol Use: No    Review of Systems  Level 5 caveat applies because pt is nonverbal.   Allergies  Other  Home Medications   Prior to Admission medications   Medication Sig Start Date End Date Taking? Authorizing Provider  acetaminophen (TYLENOL) 500 MG tablet Take 1,000 mg by mouth 3 (three) times daily.    Yes Historical Provider, MD  aspirin EC 325 MG EC tablet Take 1 tablet (325 mg total) by mouth daily. 02/03/13  Yes Ripudeep Krystal Eaton, MD  atorvastatin (LIPITOR) 10 MG tablet Take 1 tablet (10 mg total) by mouth daily at 6 PM. 08/29/13 08/29/14 Yes Tiffany L Reed, DO  Calcium Carbonate-Vit D-Min (CALTRATE 600+D PLUS) 600-800 MG-UNIT CHEW Chew 1 tablet by mouth 2 (two) times daily with a meal.    Yes Historical Provider, MD  CRANBERRY PO Take 400 mg by mouth 3 (three) times daily.    Yes Historical Provider, MD  Cyanocobalamin (VITAMIN B-12) 2500 MCG SUBL Place 5,000 mcg under the tongue daily.    Yes Historical  Provider, MD  D-Mannose POWD Take 2,000 mg by mouth 2 (two) times daily.   Yes Historical Provider, MD  ferrous sulfate 325 (65 FE) MG tablet Take 325 mg by mouth every other day.    Yes Historical Provider, MD  hydrALAZINE (APRESOLINE) 25 MG tablet Take 12.5 mg by mouth 2 (two) times daily.   Yes Historical Provider, MD  lactose free nutrition (BOOST) LIQD Take 90 mLs by mouth 2 (two) times daily between meals.    Yes Historical Provider, MD  levothyroxine (SYNTHROID, LEVOTHROID) 25 MCG tablet Take 25 mcg by mouth daily before breakfast.   Yes Historical Provider, MD  Melatonin 3 MG CAPS Take 6 mg by mouth at bedtime as needed (sleep).   Yes Historical Provider, MD  meloxicam (MOBIC) 7.5 MG tablet Take 7.5 mg by mouth daily.   Yes Historical Provider, MD  memantine (NAMENDA) 10 MG tablet Take 10 mg by mouth 2 (two) times daily.   Yes Historical Provider, MD  Multiple Vitamin (MULTIVITAMIN WITH MINERALS) TABS Take 1 tablet by mouth daily.   Yes Historical Provider, MD  NEUPRO 2 MG/24HR Place 1 patch onto the skin daily.  01/24/14  Yes Historical Provider, MD  polyethylene glycol (MIRALAX / GLYCOLAX) packet Take 17 g by mouth daily.   Yes Historical Provider, MD  Probiotic Product (PROBIOTIC DAILY) CAPS Take 1 capsule by mouth daily.   Yes Historical Provider, MD  clotrimazole-betamethasone (LOTRISONE) cream as needed. 02/01/14   Historical Provider, MD  SANTYL ointment Apply topically daily. 03/02/14   Historical Provider, MD   BP 144/83  Pulse 80  Temp(Src) 99 F (37.2 C) (Rectal)  Resp 25  SpO2 93% Physical Exam  Nursing note and vitals reviewed. Constitutional: He appears well-developed and well-nourished. No distress.  Sitting in bed. NAD.   HENT:  Head: Normocephalic and atraumatic.  Eyes: Conjunctivae are normal. Right eye exhibits no discharge. Left eye exhibits no discharge.  Neck: Neck supple.  Cardiovascular: Normal rate, regular rhythm and normal heart sounds.  Exam reveals no  gallop and no friction rub.   No murmur heard. Pulmonary/Chest: Effort normal and breath sounds normal. No respiratory distress.  Abdominal: Soft. He exhibits no distension. There is no tenderness.  Musculoskeletal: He exhibits no edema and no tenderness.  Neurological:  Awake. Nonverbal.   Skin: Skin is warm and dry.  Psychiatric: He has a normal mood and affect. His behavior is normal. Thought content normal.    ED Course  Procedures (including critical care time) Labs Review Labs Reviewed  URINALYSIS, ROUTINE W REFLEX MICROSCOPIC - Abnormal; Notable for the following:    Hgb urine dipstick MODERATE (*)    Protein, ur 30 (*)    Leukocytes, UA SMALL (*)    All other components within normal limits  CBC WITH DIFFERENTIAL - Abnormal; Notable for  the following:    WBC 12.9 (*)    RBC 4.09 (*)    Hemoglobin 11.4 (*)    HCT 35.9 (*)    Neutrophils Relative % 86 (*)    Neutro Abs 11.2 (*)    Lymphocytes Relative 7 (*)    All other components within normal limits  COMPREHENSIVE METABOLIC PANEL - Abnormal; Notable for the following:    Glucose, Bld 114 (*)    GFR calc non Af Amer 54 (*)    GFR calc Af Amer 62 (*)    All other components within normal limits  LACTIC ACID, PLASMA - Abnormal; Notable for the following:    Lactic Acid, Venous 3.4 (*)    All other components within normal limits  CLOSTRIDIUM DIFFICILE BY PCR  TROPONIN I  URINE MICROSCOPIC-ADD ON    Imaging Review No results found.   EKG Interpretation   Date/Time:  Wednesday Apr 12 2014 09:19:20 EDT Ventricular Rate:  73 PR Interval:  173 QRS Duration: 169 QT Interval:  427 QTC Calculation: 470 R Axis:   -37 Text Interpretation:  Sinus rhythm Right bundle branch block Left  ventricular hypertrophy ED PHYSICIAN INTERPRETATION AVAILABLE IN [facility] Confirmed by TEST, Record ([id number]) on 04/14/2014 7:22:06 AM      MDM   Final diagnoses:  Mental status, decreased    89yM known to me from  previous evaluations. He appears to be in no distress. W/u fairly unremarkable. Foley changed in ED. Family ultimately seeking placement. Social work spoke with family as has done on previous presentations. Will DC to pursuit placement.     Virgel Manifold, MD 04/19/14 [phone]

## 2014-04-18 NOTE — Telephone Encounter (Signed)
Ricky [name],son,Notified. Son stated that his father was not drinking and was having difficult swallowing. Informed him that Janett Billow stated for patient to go to Hospital if this was happening. He agreed.

## 2014-04-20 ENCOUNTER — Ambulatory Visit (INDEPENDENT_AMBULATORY_CARE_PROVIDER_SITE_OTHER): Payer: Medicare HMO | Admitting: Physician Assistant

## 2014-04-20 ENCOUNTER — Other Ambulatory Visit: Payer: Self-pay | Admitting: *Deleted

## 2014-04-20 ENCOUNTER — Encounter: Payer: Self-pay | Admitting: Physician Assistant

## 2014-04-20 VITALS — BP 145/83 | HR 64 | Ht 70.0 in | Wt 150.0 lb

## 2014-04-20 DIAGNOSIS — F0280 Dementia in other diseases classified elsewhere without behavioral disturbance: Secondary | ICD-10-CM

## 2014-04-20 DIAGNOSIS — R55 Syncope and collapse: Secondary | ICD-10-CM

## 2014-04-20 DIAGNOSIS — I10 Essential (primary) hypertension: Secondary | ICD-10-CM

## 2014-04-20 DIAGNOSIS — G309 Alzheimer's disease, unspecified: Secondary | ICD-10-CM

## 2014-04-20 MED ORDER — COLLAGENASE 250 UNIT/GM EX OINT: TOPICAL_OINTMENT | Freq: Every day | CUTANEOUS | Status: DC

## 2014-04-20 NOTE — Progress Notes (Signed)
Cardiology Office Note   Date:  04/20/2014   ID:  [name], DOB 08-05-1917, MRN [id number]  PCP:  Hollace Kinnier, DO  Cardiologist:  Dr. Dorris Carnes      History of Present Illness: Ricky [name] is a 80 y.o. male with a hx of recurrent syncope, progressive supranuclear palsy, HTN, Alzheimer's dementia, Parkinsonism, hypothyroidism, prior pulmonary embolism, CKD, anemia of chronic disease.  Admitted for syncope in 2011 and was bradycardic on Aricept.  This was d/c'd.  Chest CT in 10/2010 demonstrated chronic pulmonary emboli.  EF in 2012 normal by echo.  Syncope in 02/2012 felt to be 2/2 UTI.  Seen in 10/2012 after recurrent syncope and Event monitor was negative.  Last seen by Dr. Dorris Carnes in 10/2013.  It was felt his syncope may be neurogenic.  She recommended keeping his BP from dropping too low.    He has been in declining health over the last several months.  He was admitted in 01/2014 for syncope.  He was seen by neurology.  MRI was neg for acute CVA.  Syncope felt to be vasovagal.  Admitted again in 02/2014 with UTI.  He has a chronic indwelling Foley catheter.  Readmitted 5/4-5/12 with sepsis 2/2 recurrent UTI vs HCAP.  Other problems recently noted include failure to thrive, sacral decubitus, dysphagia, severe protein calorie malnutrition.    He is here with his son. His son provides the entire history. The patient has had cognitive decline recently. However, after being treated for sepsis, his mentation has improved. His diet has recently improved as well. He is sleeping more however. There've been no reports of chest pain or significant dyspnea. He denies with orthopnea, PND or edema.   Studies:  - Echo (01/16/14):  EF 55% to 60%. Mild AI.  PASP 38mm Hg  - Event Monitor (10/2012):  NSR   MRI/MRA (01/16/14): IMPRESSION: 1. Suboptimal examination as above. No evidence of acute infarct. 2. Mild to moderate chronic small vessel ischemic disease and moderate cerebral atrophy. 3. No  definite evidence of large vessel intracranial arterial occlusion. Suboptimal evaluation of branch vessels as above.    Recent Labs: 03/21/2014: Pro B Natriuretic peptide (BNP) 3769.0*; TSH 0.804  04/12/2014: ALT 25  04/13/2014: Creatinine 0.84; Hemoglobin 10.5*; Potassium 4.0   Wt Readings from Last 3 Encounters:  03/31/14 141 lb 8 oz (64.184 kg)  03/29/14 158 lb (71.668 kg)  03/28/14 153 lb 11.2 oz (69.718 kg)     Past Medical History  Diagnosis Date  . IBS (irritable bowel syndrome)   . Diverticulosis   . Cataract   . OAB (overactive bladder)   . Macular degeneration   . Alzheimer disease   . Hypertension   . Arthritis   . Osteoporosis   . Parkinsonian syndrome   . Syncope, vasovagal   . Neuromuscular disorder     parkisonian syndrome  . 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 Unspecified transient cerebral ischemia   . Unspecified urinary incontinence   . Senile dementia, uncomplicated   . Unspecified constipation   . Hypothyroidism   . Anemia, unspecified   . Depression   . Alzheimer's disease   . Gout, unspecified   . Thoracic or lumbosacral neuritis or radiculitis, unspecified   . Progressive supranuclear palsy   . Unspecified transient cerebral ischemia   . Unspecified urinary incontinence   . Syncope and collapse   . Paralysis agitans   . Unspecified transient cerebral ischemia   .  Pneumonitis due to inhalation of food or vomitus   . Cellulitis and abscess of oral soft tissues   . Screening for lipoid disorders   . Senile dementia, uncomplicated   . Unspecified constipation   . Disorder of bone and cartilage, unspecified   . Other specified acquired hypothyroidism   . Unspecified hypothyroidism   . Anemia, unspecified   . Depressive disorder, not elsewhere classified   . Alzheimer's disease   . Paralysis agitans   . Pneumonia, organism unspecified   . Pseudomonas infection in conditions classified elsewhere and of  unspecified site   . Unspecified essential hypertension   . Urinary tract infection, site not specified   . Osteoarthrosis, unspecified whether generalized or localized, unspecified site   . Osteoporosis, unspecified   . Gout, unspecified   . Thoracic or lumbosacral neuritis or radiculitis, unspecified   . Stroke     Current Outpatient Prescriptions  Medication Sig Dispense Refill  . acetaminophen (TYLENOL) 500 MG tablet Take 1,000 mg by mouth 3 (three) times daily.       Marland Kitchen aspirin EC 325 MG EC tablet Take 1 tablet (325 mg total) by mouth daily.  30 tablet  3  . atorvastatin (LIPITOR) 10 MG tablet Take 1 tablet (10 mg total) by mouth daily at 6 PM.  90 tablet  1  . Calcium Carbonate-Vit D-Min (CALTRATE 600+D PLUS) 600-800 MG-UNIT CHEW Chew 1 tablet by mouth 2 (two) times daily with a meal.       . clotrimazole-betamethasone (LOTRISONE) cream as needed.      Marland Kitchen CRANBERRY PO Take 400 mg by mouth 3 (three) times daily.       . Cyanocobalamin (VITAMIN B-12) 2500 MCG SUBL Place 5,000 mcg under the tongue daily.       . D-Mannose POWD Take 2,000 mg by mouth 2 (two) times daily.      . ferrous sulfate 325 (65 FE) MG tablet Take 325 mg by mouth every other day.       . hydrALAZINE (APRESOLINE) 25 MG tablet Take 12.5 mg by mouth 2 (two) times daily.      Marland Kitchen lactose free nutrition (BOOST) LIQD Take 90 mLs by mouth 2 (two) times daily between meals.       Marland Kitchen levothyroxine (SYNTHROID, LEVOTHROID) 25 MCG tablet Take 25 mcg by mouth daily before breakfast.      . Melatonin 3 MG CAPS Take 6 mg by mouth at bedtime as needed (sleep).      . meloxicam (MOBIC) 7.5 MG tablet Take 7.5 mg by mouth daily.      . memantine (NAMENDA) 10 MG tablet Take 10 mg by mouth 2 (two) times daily.      . Multiple Vitamin (MULTIVITAMIN WITH MINERALS) TABS Take 1 tablet by mouth daily.      Marland Kitchen NEUPRO 2 MG/24HR Place 1 patch onto the skin daily.       . polyethylene glycol (MIRALAX / GLYCOLAX) packet Take 17 g by mouth daily.       . Probiotic Product (PROBIOTIC DAILY) CAPS Take 1 capsule by mouth daily.      Marland Kitchen SANTYL ointment Apply topically daily.       No current facility-administered medications for this visit.    Allergies:   Other   Social History:  The patient  reports that he has quit smoking. He has never used smokeless tobacco. He reports that he does not drink alcohol or use illicit drugs.   Family History:  The patient's family history includes Cancer in his mother; Emphysema in his brother; Kidney disease in his brother; Liver disease in his brother and father; Stroke in his father.   ROS:  Please see the history of present illness.      All other systems reviewed and negative.   PHYSICAL EXAM: VS:  BP 145/83  Pulse 64  Ht 5\' 10"  (1.778 m)  Wt 150 lb (68.04 kg)  BMI 21.52 kg/m2 Chronically ill-appearing elderly male who is hunched over in a wheelchair for the entire interview HEENT: normal Neck: no JVDat 90 Cardiac:  normal S1, S2; RRR; no murmur Lungs:  Decreased breath sounds bilaterally, no wheezing, rhonchi or rales Abd: soft, nontender, no hepatomegaly Ext: no edema Skin: warm and dry Neuro:  CNs 2-12 intact, no focal abnormalities noted  EKG:  NSR, HR 64, RBBB, inferior T wave inversions, no change from prior tracing     ASSESSMENT AND PLAN:  1. Syncope: His most recent episodes of syncope all coincided with acute illnesses. Cardiac workup has been fairly unrevealing in the past. At this point, no further cardiac workup is necessary. 2. HTN (hypertension): Controlled continue current therapy. 3. ALZHEIMERS DISEASE: Continue follow up with neurology. 4. Disposition: Follow up with Dr. Harrington Challenger in 6 months.   Signed, Ricky [name], MHS 04/20/2014 11:21 AM    [facility] [address], [place], [place]  [address] Phone: [phone]; Fax: [phone]

## 2014-04-20 NOTE — Patient Instructions (Signed)
Your physician wants you to follow-up in: 6 MONTHS WITH DR ROSS You will receive a reminder letter in the mail two months in advance. If you don't receive a letter, please call our office to schedule the follow-up appointment. Your physician recommends that you continue on your current medications as directed. Please refer to the Current Medication list given to you today. 

## 2014-05-12 ENCOUNTER — Encounter (HOSPITAL_BASED_OUTPATIENT_CLINIC_OR_DEPARTMENT_OTHER): Payer: Medicare HMO | Attending: General Surgery

## 2014-05-12 DIAGNOSIS — I739 Peripheral vascular disease, unspecified: Secondary | ICD-10-CM | POA: Insufficient documentation

## 2014-05-12 DIAGNOSIS — L89109 Pressure ulcer of unspecified part of back, unspecified stage: Secondary | ICD-10-CM | POA: Insufficient documentation

## 2014-05-12 DIAGNOSIS — R29818 Other symptoms and signs involving the nervous system: Secondary | ICD-10-CM | POA: Insufficient documentation

## 2014-05-12 DIAGNOSIS — L8993 Pressure ulcer of unspecified site, stage 3: Secondary | ICD-10-CM | POA: Insufficient documentation

## 2014-05-12 DIAGNOSIS — I2510 Atherosclerotic heart disease of native coronary artery without angina pectoris: Secondary | ICD-10-CM | POA: Insufficient documentation

## 2014-05-13 NOTE — Progress Notes (Signed)
[facility]  NAME:  [name], [name] NO.:  [id number]  MEDICAL RECORD NO.:  [id number]      DATE OF BIRTH:  25-Jan-1917  PHYSICIAN:  Judene Companion, M.D.           VISIT DATE:                                  OFFICE VISIT   Ricky [name] is a 80 year old African American male, who I have known for several years here at the wound clinic.  He has had problems when he has had pulmonary problems and been in the hospital with pneumonia and various other symptoms of shortness of breath and hydrothorax.  He has coronary artery disease, peripheral vascular disease, and at that time when we saw him before he had pressure sores on his heels, which we healed with Dermagraft and multiple dressing changes and debridements and offloading.  Now, he has been in bed because he has had some sort of neurologic setback where he is not very mobile and spends a lot of time in bed and he has developed a bedsore over his sacrum, which I would rank as stage III.  I debrided of a lot of necrotic material, and then we packed it with silver alginate and I explained to the son how to do this.  We will plan on seeing him back here in a week.  He is really not a very good candidate for a pump, although that might help, so we will go now with silver alginate and I will offload him and will try to get him an air mattress, which would help greatly.  So, his diagnosis is pressure ulcer stage III of the sacrum.  When he came in here, he was found to have a blood pressure of 126/66, respirations 16, pulse 76, temperature 98.  He is 66 and he only weighs 150 pounds.     Judene Companion, M.D.     PP/MEDQ  D:  05/12/2014  T:  05/13/2014  Job:  [id number]

## 2014-05-23 ENCOUNTER — Telehealth: Payer: Self-pay

## 2014-05-23 NOTE — Telephone Encounter (Signed)
I will do this when I am there Thursday.  I can sign a letter today if it is typed up stating she was taking care of her father on July 4th.

## 2014-05-23 NOTE — Telephone Encounter (Signed)
Patient's daughter Ricky [name]) completed walk-in triage form indicating she needs a work Quarry manager for July 4th. Ricky [name] spent the weekend with her father due to his decline in health for the family realizes this will be his last July 4th He will see.   Dr.Reed please advise on this request  Ricky [name] also left FMLA paperwork to be completed by the provider. Ricky [name] completed majority of this paperwork, the remaining part is for the provider.  Location: FMLA Paperwork was placed on the ledge under Dr.Reed's mat

## 2014-05-25 DIAGNOSIS — Z029 Encounter for administrative examinations, unspecified: Secondary | ICD-10-CM

## 2014-05-26 ENCOUNTER — Encounter (HOSPITAL_BASED_OUTPATIENT_CLINIC_OR_DEPARTMENT_OTHER): Payer: Medicare HMO | Attending: General Surgery

## 2014-05-26 ENCOUNTER — Encounter: Payer: Self-pay | Admitting: Neurology

## 2014-05-26 ENCOUNTER — Ambulatory Visit (INDEPENDENT_AMBULATORY_CARE_PROVIDER_SITE_OTHER): Payer: Medicare HMO | Admitting: Neurology

## 2014-05-26 VITALS — BP 122/68 | HR 80 | Temp 97.2°F | Ht 70.0 in

## 2014-05-26 DIAGNOSIS — Z978 Presence of other specified devices: Secondary | ICD-10-CM

## 2014-05-26 DIAGNOSIS — L8993 Pressure ulcer of unspecified site, stage 3: Secondary | ICD-10-CM | POA: Insufficient documentation

## 2014-05-26 DIAGNOSIS — R293 Abnormal posture: Secondary | ICD-10-CM

## 2014-05-26 DIAGNOSIS — L89109 Pressure ulcer of unspecified part of back, unspecified stage: Secondary | ICD-10-CM | POA: Diagnosis not present

## 2014-05-26 DIAGNOSIS — R5381 Other malaise: Secondary | ICD-10-CM

## 2014-05-26 DIAGNOSIS — Z960 Presence of urogenital implants: Secondary | ICD-10-CM

## 2014-05-26 DIAGNOSIS — N390 Urinary tract infection, site not specified: Secondary | ICD-10-CM

## 2014-05-26 DIAGNOSIS — R54 Age-related physical debility: Secondary | ICD-10-CM

## 2014-05-26 DIAGNOSIS — J690 Pneumonitis due to inhalation of food and vomit: Secondary | ICD-10-CM

## 2014-05-26 DIAGNOSIS — Z9889 Other specified postprocedural states: Secondary | ICD-10-CM

## 2014-05-26 DIAGNOSIS — G238 Other specified degenerative diseases of basal ganglia: Secondary | ICD-10-CM

## 2014-05-26 DIAGNOSIS — Z9181 History of falling: Secondary | ICD-10-CM

## 2014-05-26 DIAGNOSIS — R413 Other amnesia: Secondary | ICD-10-CM

## 2014-05-26 DIAGNOSIS — R296 Repeated falls: Secondary | ICD-10-CM

## 2014-05-26 DIAGNOSIS — R4181 Age-related cognitive decline: Secondary | ICD-10-CM

## 2014-05-26 DIAGNOSIS — R269 Unspecified abnormalities of gait and mobility: Secondary | ICD-10-CM

## 2014-05-26 DIAGNOSIS — G231 Progressive supranuclear ophthalmoplegia [Steele-Richardson-Olszewski]: Secondary | ICD-10-CM

## 2014-05-26 NOTE — Progress Notes (Signed)
Subjective:    Patient ID: Ricky [name] is a 80 y.o. male.  HPI    Interim history:   [name] is a very pleasant 80 year old right-handed gentleman with a complex underlying medical history of pneumonia, pulmonary embolism, UTIs, recurrent sycope, deconditioning, immobility, and memory loss, who presents for followup consultation of his parkinsonism, possible PSP. He is accompanied by his son, Ricky [name], again today. I last saw him on 01/04/2014, at which time I felt he had advanced PSP. I agreed that we could try him on particular G. patch again at the request of his son. I continued him on Namenda but I was sure to explained to the patient and his son that given his advanced age and the atypical parkinsonism he would be more at risk for side effects. I suggested initiating physical therapy outpatient again. I felt he needed 24-7 supervision. In the interim, he was admitted to the hospital several times: On 01/15/14 he was at [facility] for syncope d/t perhaps constipation. He was D/C on 01/17/14, he was admitted on 03/08/14 for fall with abrasion to forehead, was treated for UTI and URI, which was found at the time, D/C on 03/13/14, he was admitted to [facility] on 03/20/14, d/t UTI and aspiration pneumonia and D/C on 03/28/14 and went to [facility] for 3 days, but admission was denied by his insurance and then went to [facility] ER from there, not admitted and again went to the [facility] ER on 04/12/14 for not eating and drinking well, was dehydrated and D/C to home.   Today, his son gives the history and states that he since then has been accepted to [facility]. He was not seen in outpatient PT because of the hospitalizations and he would lose the [facility] therapies. He was seen by GI and a G tube was discussed and they mutually agreed not to pursue it. He has since then been eating and drinking rather well and has gained some weight, per Ricky [name]. He never actually got to try the Neupro. He also developed a sore on his coccyx. He's  been seeing wound care for this. He has an appointment today in fact.  I saw him on 07/02/2013, at which time I did not start him on any medications. He had tried Sinemet, Neupro and Aricept in the past. I continued him on Namenda.   He was seen in the emergency room several times. On 07/12/2013 he presented to the ER after a syncopal spell. Workup included EKG and urinalysis. He did not need further admission or treatment. On 09/09/2013 he presented to the emergency room not feeling well and complaining of back pain. An x-ray of the lumbar spine showed an L1 anterior wedge compression fracture of unknown age. He did not need further treatment or admission. On 10/18/2013 he was admitted to the hospital for one day for chest pain. Workup showed no acute coronary event, but he was felt to have atypical chest pain from GERD. They started protonix, he saw cardiology afterwards. The protonix was stopped after about 6 weeks. He was taken back to the ER on 10/20/14 d/t hematuria and eventually this stopped after changing the foley. He also tested positive for C. Diff and treated with Flagyl, which did not help and on 10/31/13 he was placed on PO Vanc pills from 15th to 18th, then liquid for another 8 days, by Dr. Harrington Challenger, his cardiologist. He has had C. Diff 2 times before, per Ricky [name].  Today, Ricky [name] (who provides the entire Hx)  reports, that patient had no been "right" since 9/14 off and on. He had elevated temperature off and on, up to 99.1, which is up from his baseline of around 97.1. In the last few weeks, he has been better, more closer to his baseline. Ricky [name] walks him about 7 times a day with maximum assistance by holding both is hands. In 1/13, he was using a walker and was supposed to have PT, but was not able to follow commands at the time and in hindsight, he had a smoldering infection. He has overall become stiffer and slower. He is on cranberry juice and tablets, and D-Mannose prn for UTI. He seems to have  small incidences with aspiration. He has lately been eating better. He had some teeth pulled. His liquids are thickened and his food is finely chopped. There is a nurse that comes in every day around 8:15.  Looking back, Ricky [name] reports that when patient was on Neupro patch he was a little bit better. Ricky [name] did not notice any side effects.  I first met him on 04/01/2013 at which time I felt that the constellation of parkinsonism, memory loss, gait dysfunction, balance issues, recurrent falls and syncopal spells as well as a history of recurrent urinary tract infections most likely pointed towards a diagnosis of PSP (progressive supranuclear palsy). He previously followed with Dr. Morene Antu and was last seen by him on 12/03/2012. He has a history of blackout spells for the past 5 years as well as memory loss. The first episode of loss of consciousness was noted in March 2010 at which time his son found him down on the floor rigid with his head flexed and his right hand trembling. He was admitted to the hospital for 3 days and diagnosed with vasovagal syncope and had workup including chest x-ray (cardiomegaly), head CT without contrast (chronic small vessel disease), MRI brain without contrast (no acute abnormality and chronic small vessel disease), MRA head (normal), echocardiogram (EF of 60%). He had pneumonia, complicated by a PE in 2/83 and repeat MRI brain in 1/11 (generalized atrophy) and MRA head (mild stenosis of the L MCA). He was tried on Aricept (stopped d/t bradycardia) and Sinemet (discontinued). He has had recurrent pneumonia and recurrent UTIs and had a Foley catheter placed. He was at [facility] last in April 2013. He developed pressure ulcers on his feet. He has a CNA daily to help him bathe and dress and he has supervision 24-7. He was taken off of Neupro patch a hospitalization for UTI and his mental functioning improved.  He was admitted to the hospital with fever and altered mental status on  06/08/2013 and discharged on 06/17/2013. He was treated for a urinary tract infection with IV vancomycin and had an LP with CSF cultures negative. He has become WC bound.    His Past Medical History Is Significant For: Past Medical History  Diagnosis Date  . IBS (irritable bowel syndrome)   . Diverticulosis   . Cataract   . OAB (overactive bladder)   . Macular degeneration   . Alzheimer disease   . Hypertension   . Arthritis   . Osteoporosis   . Parkinsonian syndrome   . Syncope, vasovagal   . Neuromuscular disorder     parkisonian syndrome  . 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 Unspecified transient cerebral ischemia   . Unspecified urinary incontinence   . Senile dementia, uncomplicated   . Unspecified constipation   . Hypothyroidism   .  Anemia, unspecified   . Depression   . Alzheimer's disease   . Gout, unspecified   . Thoracic or lumbosacral neuritis or radiculitis, unspecified   . Progressive supranuclear palsy   . Unspecified transient cerebral ischemia   . Unspecified urinary incontinence   . Syncope and collapse   . Paralysis agitans   . Unspecified transient cerebral ischemia   . Pneumonitis due to inhalation of food or vomitus   . Cellulitis and abscess of oral soft tissues   . Screening for lipoid disorders   . Senile dementia, uncomplicated   . Unspecified constipation   . Disorder of bone and cartilage, unspecified   . Other specified acquired hypothyroidism   . Unspecified hypothyroidism   . Anemia, unspecified   . Depressive disorder, not elsewhere classified   . Alzheimer's disease   . Paralysis agitans   . Pneumonia, organism unspecified   . Pseudomonas infection in conditions classified elsewhere and of unspecified site   . Unspecified essential hypertension   . Urinary tract infection, site not specified   . Osteoarthrosis, unspecified whether generalized or localized, unspecified site   . Osteoporosis,  unspecified   . Gout, unspecified   . Thoracic or lumbosacral neuritis or radiculitis, unspecified   . Stroke     His Past Surgical History Is Significant For: Past Surgical History  Procedure Laterality Date  . Esophagogastroduodenoscopy    . Eye surgery  2006    LEFT CATARACT    His Family History Is Significant For: Family History  Problem Relation Age of Onset  . Cancer Mother   . Stroke Father   . Liver disease Father   . Kidney disease Brother   . Liver disease Brother   . Emphysema Brother     His Social History Is Significant For: History   Social History  . Marital Status: Single    Spouse Name: N/A    Number of Children: N/A  . Years of Education: N/A   Social History Main Topics  . Smoking status: Former Research scientist (life sciences)  . Smokeless tobacco: Never Used     Comment: QUIT SMOKING BACK IN THE LATE 50'S  . Alcohol Use: No  . Drug Use: No  . Sexual Activity: No   Other Topics Concern  . None   Social History Narrative  . None    His Allergies Are:  Allergies  Allergen Reactions  . Other Other (See Comments)    Dairy products- cause swallowing difficulty   :   His Current Medications Are:  Outpatient Encounter Prescriptions as of 05/26/2014  Medication Sig  . acetaminophen (TYLENOL) 500 MG tablet Take 1,000 mg by mouth 3 (three) times daily.   Marland Kitchen aspirin EC 325 MG EC tablet Take 1 tablet (325 mg total) by mouth daily.  Marland Kitchen atorvastatin (LIPITOR) 10 MG tablet Take 1 tablet (10 mg total) by mouth daily at 6 PM.  . bacitracin ointment Apply 1 application topically 2 (two) times daily.  . Calcium Carbonate-Vit D-Min (CALTRATE 600+D PLUS) 600-800 MG-UNIT CHEW Chew 1 tablet by mouth 2 (two) times daily with a meal.   . clotrimazole-betamethasone (LOTRISONE) cream as needed.  Marland Kitchen CRANBERRY PO Take 400 mg by mouth 3 (three) times daily.   . Cyanocobalamin (VITAMIN B-12) 2500 MCG SUBL Place 5,000 mcg under the tongue daily.   . D-Mannose POWD Take 2,000 mg by mouth 2  (two) times daily.  . ferrous sulfate 325 (65 FE) MG tablet Take 325 mg by mouth every other day.   Marland Kitchen  hydrALAZINE (APRESOLINE) 25 MG tablet Take 12.5 mg by mouth 2 (two) times daily.  Marland Kitchen lactose free nutrition (BOOST) LIQD Take 90 mLs by mouth 2 (two) times daily between meals.   Marland Kitchen levothyroxine (SYNTHROID, LEVOTHROID) 25 MCG tablet Take 25 mcg by mouth daily before breakfast.  . Melatonin 3 MG CAPS Take 6 mg by mouth at bedtime as needed (sleep).  . meloxicam (MOBIC) 7.5 MG tablet Take 7.5 mg by mouth daily.  . memantine (NAMENDA) 10 MG tablet Take 10 mg by mouth 2 (two) times daily.  . Multiple Vitamin (MULTIVITAMIN WITH MINERALS) TABS Take 1 tablet by mouth daily.  . Multiple Vitamins-Minerals (ICAPS AREDS FORMULA PO) Take 2 capsules by mouth 2 (two) times daily.  . polyethylene glycol (MIRALAX / GLYCOLAX) packet Take 17 g by mouth daily.  . Probiotic Product (PROBIOTIC DAILY) CAPS Take 1 capsule by mouth daily. W/fiber probiotic  . pseudoephedrine-guaifenesin (MUCINEX D) 60-600 MG per tablet Take 1 tablet by mouth every 12 (twelve) hours as needed for congestion.  . [DISCONTINUED] collagenase (SANTYL) ointment Apply topically daily.  . [DISCONTINUED] NEUPRO 2 MG/24HR Place 1 patch onto the skin daily.   :  Review of Systems:  Out of a complete 14 point review of systems, all are reviewed and negative with the exception of these symptoms as listed below:  Review of Systems  Constitutional: Positive for activity change and appetite change.  HENT: Positive for drooling, rhinorrhea and trouble swallowing.   Eyes: Negative.   Respiratory: Negative.   Cardiovascular: Negative.   Gastrointestinal: Positive for anal bleeding.  Endocrine: Negative.   Genitourinary: Positive for enuresis.  Musculoskeletal: Positive for gait problem and neck stiffness.  Skin: Negative.   Allergic/Immunologic: Negative.   Neurological: Positive for speech difficulty.       Memory loss  Hematological:  Negative.   Psychiatric/Behavioral: Positive for confusion, sleep disturbance (eds) and agitation.    Objective:  Neurologic Exam  Physical Exam Physical Examination:   Filed Vitals:   05/26/14 1118  BP: 122/68  Pulse: 80  Temp: 97.2 F (36.2 C)   General Examination: The patient is a very pleasant 80 y.o. male in no acute distress. He is frail and minimally verbal, able to answer in one word responses. He is in his WC and severely stooped and leaning to the L.  HEENT: Normocephalic, atraumatic, pupils are equal, round and reactive to light and accommodation. Extraocular tracking shows severe saccadic breakdown without nystagmus noted. There is severe limitation to entire gaze. L eyelid is very ptotic and swollen. There is moderate decrease in eye blink rate and he keeps his eyes closed most of the time. Hearing is mildly impaired. Face is symmetric with moderate facial masking and normal facial sensation. There is no lip, neck or jaw tremor. Neck is severely rigid with decreased passive ROM and severe anterocollis and L laterocollis. There are no carotid bruits on auscultation. Oropharynx exam reveals moderate mouth dryness and partially edentulous state. No significant airway crowding is noted. Mallampati is class III. Tongue protrudes centrally and palate elevates symmetrically. There is no drooling.   Chest: is clear to auscultation without wheezing, rhonchi or crackles noted.  Heart: sounds are regular and normal without murmurs, rubs or gallops noted.   Abdomen: is soft, non-tender and non-distended with normal bowel sounds appreciated on auscultation.  Extremities: There is trace pitting edema in the distal lower extremities bilaterally.   Skin: is warm and dry with no trophic changes noted. Age-related changes are noted  on the skin.   Musculoskeletal: exam reveals no obvious joint deformities, tenderness, joint swelling or erythema.  Neurologically:  Mental status: The  patient is awake and alert, paying fair to poor attention. He is unable to provide the history. His son provides the entire history. He is oriented to: self, place and situation. His memory, attention, language and knowledge are impaired. There is no aphasia, agnosia, apraxia or anomia. There is a moderate degree of bradyphrenia. Speech is very scant, moderately hypophonic with mild dysarthria noted. Mood is congruent and affect is blunted.   Cranial nerves are as described above under HEENT exam.   Motor exam: Normal bulk, and global strength of 4/5 is noted. There are no dyskinesias noted.   Tone is severely rigid with absence of cogwheeling. There is overall severe bradykinesia. There is no drift or rebound. There is no tremor.  Reflexes are trace in the upper extremities and trace in the lower extremities. Toes are downgoing bilaterally.  Fine motor skills exam: Fine motor skills and globally and severely impaired.   Cerebellar testing is not possible.   Sensory exam is intact to light touch.   Gait, station and balance: I did not have him stand or walk for me today.   Assessment and Plan:   In summary, GURNIE DURIS is a very pleasant 80 year old male with a history of recurrent syncopal spells, parkinsonism, memory loss, gait dysfunction, balance issues, recurrent falls and recurrent urinary tract infections and recurrent C. difficile colitis and URI, multiple hospitalizations and complications from deconditioning and overall frailty. His history and exam is consistent with progressive supranuclear palsy, advanced.  I again had a long chat with the patient and particularly his son, Ricky [name], today about my findings and the diagnosis of PSP, the prognosis and treatment options. We talked about medical treatments and non-pharmacological approaches. I encouraged them to keep him well hydrated, keep a scheduled bedtime and wake time routine, to not skip any meals and eat healthy snacks in  between meals and to have protein with every meal. He has been tried on Sinemet in the past, and Aricept and is on Namenda. I think we should avoid trying any new medications at this time. He is somewhat stable at this time. His son asked specifically about using a collar to help his neck posture. I advised against it. He has a quite significant fixed posture change at this time and using a collar will not be helpful in may hurt him. He will continue with home health therapy. I would like to see him back in 3 months, sooner if the need arises and encouraged them to call with any interim questions, concerns, problems or updates and refill requests. Unfortunately, his condition is invariably progressive and he has had recurrent admissions for recurrent urinary tract infections, or upper respiratory infections, or concern for aspiration pneumonias.  Most of my 40 minute visit today was spent in counseling and coordination of care, reviewing test results and reviewing medication.

## 2014-05-26 NOTE — Telephone Encounter (Signed)
Patient was given letter and FMLA on Thursday

## 2014-05-26 NOTE — Patient Instructions (Signed)
We will do our routine follow up in about 3 months.

## 2014-05-29 ENCOUNTER — Telehealth: Payer: Self-pay | Admitting: Neurology

## 2014-05-29 ENCOUNTER — Telehealth: Payer: Self-pay | Admitting: Internal Medicine

## 2014-05-29 NOTE — Telephone Encounter (Signed)
New message     Pt is on atorvastatin.  He has been reading that it causes memory problems and he already has dementia.  Could this be a problem for his dad?

## 2014-05-29 NOTE — Telephone Encounter (Signed)
Patient's son [name] calling to ask about one of patient's medications, states that he read that it can cause memory problems and [name] states that patient already has dementia so he is concerned. Please return call to son and advise.

## 2014-05-29 NOTE — Telephone Encounter (Signed)
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  Son has been reading about possible side effects of Atorvastatin causing memory loss.  He wants to know if OK to stop.  He had cholesterol checked first of year by [facility] but doesn't have results with him. Spoke w/Dr. Harrington Challenger who states he can stop taking medication. Notified son.

## 2014-05-30 NOTE — Telephone Encounter (Signed)
Called pt and spoke with pt's son [name] and son stated that the pt's cardiologist discontinue the Lipitor and that this matter was already taken care of. I advised the son that if the pt has any other problems, questions or concerns to call the office. Son verbalized understanding. FYI

## 2014-06-02 DIAGNOSIS — L8993 Pressure ulcer of unspecified site, stage 3: Secondary | ICD-10-CM | POA: Diagnosis not present

## 2014-06-02 DIAGNOSIS — L89109 Pressure ulcer of unspecified part of back, unspecified stage: Secondary | ICD-10-CM | POA: Diagnosis not present

## 2014-06-03 ENCOUNTER — Other Ambulatory Visit: Payer: Self-pay | Admitting: Internal Medicine

## 2014-06-03 DIAGNOSIS — Z466 Encounter for fitting and adjustment of urinary device: Secondary | ICD-10-CM

## 2014-06-03 DIAGNOSIS — L89109 Pressure ulcer of unspecified part of back, unspecified stage: Secondary | ICD-10-CM

## 2014-06-03 DIAGNOSIS — G20 Parkinson's disease: Secondary | ICD-10-CM

## 2014-06-03 DIAGNOSIS — R338 Other retention of urine: Secondary | ICD-10-CM

## 2014-06-03 DIAGNOSIS — G20A1 Parkinson's disease without dyskinesia, without mention of fluctuations: Secondary | ICD-10-CM

## 2014-06-07 ENCOUNTER — Telehealth: Payer: Self-pay | Admitting: *Deleted

## 2014-06-07 MED ORDER — AMBULATORY NON FORMULARY MEDICATION: Status: DC

## 2014-06-07 NOTE — Telephone Encounter (Signed)
[name] sent a fax stating that patient needs a written Rx for a Medical Bed and Air Mattress [phone]) Pressure Air Mattress. Called and spoke with [name] and explained that Dr. Mariea Clonts would be in tomorrow and I will have her sign and fax back to: [facility] Medical Attention: Archie Balboa Fax #  [phone]  Printed Rx's for Dr. Mariea Clonts to sign.

## 2014-06-08 ENCOUNTER — Other Ambulatory Visit: Payer: Self-pay | Admitting: *Deleted

## 2014-06-08 MED ORDER — AMBULATORY NON FORMULARY MEDICATION: Status: DC

## 2014-06-08 NOTE — Telephone Encounter (Signed)
Faxed Rx's to [facility] at [employer] # [phone]

## 2014-06-08 NOTE — Telephone Encounter (Signed)
I signed them

## 2014-06-08 NOTE — Telephone Encounter (Signed)
Ricky [name] with [facility] called and stated that the Medical Bed had to state [facility] Bed. Changed and given to Dr. Mariea Clonts to sign and fax to [facility]

## 2014-06-12 DIAGNOSIS — L89109 Pressure ulcer of unspecified part of back, unspecified stage: Secondary | ICD-10-CM | POA: Diagnosis not present

## 2014-06-12 NOTE — Telephone Encounter (Signed)
Order request still not received. Patient's daughter called requesting that order be re-submitted to [facility] at [facility]. Order was re-faxed

## 2014-06-21 ENCOUNTER — Telehealth: Payer: Self-pay | Admitting: *Deleted

## 2014-06-21 NOTE — Telephone Encounter (Signed)
Ricky [name] with [facility] called and stated they denied the Power Pressure Air Mattress for patient and request Peer to Peer Review with Doctor. Please Call # [phone]

## 2014-06-23 ENCOUNTER — Encounter (HOSPITAL_BASED_OUTPATIENT_CLINIC_OR_DEPARTMENT_OTHER): Payer: Medicare HMO | Attending: General Surgery

## 2014-06-23 DIAGNOSIS — L89109 Pressure ulcer of unspecified part of back, unspecified stage: Secondary | ICD-10-CM | POA: Diagnosis not present

## 2014-06-23 DIAGNOSIS — L8993 Pressure ulcer of unspecified site, stage 3: Secondary | ICD-10-CM | POA: Diagnosis not present

## 2014-06-28 ENCOUNTER — Inpatient Hospital Stay (HOSPITAL_COMMUNITY)
Admission: EM | Admit: 2014-06-28 | Discharge: 2014-07-01 | DRG: 393 | Disposition: A | Discharge status: Home / Self Care | Payer: Medicare HMO | Attending: Internal Medicine | Admitting: Internal Medicine

## 2014-06-28 ENCOUNTER — Observation Stay (HOSPITAL_COMMUNITY): Payer: Medicare HMO

## 2014-06-28 ENCOUNTER — Encounter (HOSPITAL_COMMUNITY): Payer: Self-pay | Admitting: Emergency Medicine

## 2014-06-28 DIAGNOSIS — F329 Major depressive disorder, single episode, unspecified: Secondary | ICD-10-CM | POA: Diagnosis present

## 2014-06-28 DIAGNOSIS — F3289 Other specified depressive episodes: Secondary | ICD-10-CM | POA: Diagnosis present

## 2014-06-28 DIAGNOSIS — G309 Alzheimer's disease, unspecified: Secondary | ICD-10-CM | POA: Diagnosis present

## 2014-06-28 DIAGNOSIS — Z66 Do not resuscitate: Secondary | ICD-10-CM | POA: Diagnosis present

## 2014-06-28 DIAGNOSIS — Z8673 Personal history of transient ischemic attack (TIA), and cerebral infarction without residual deficits: Secondary | ICD-10-CM

## 2014-06-28 DIAGNOSIS — R627 Adult failure to thrive: Secondary | ICD-10-CM

## 2014-06-28 DIAGNOSIS — E43 Unspecified severe protein-calorie malnutrition: Secondary | ICD-10-CM

## 2014-06-28 DIAGNOSIS — L89109 Pressure ulcer of unspecified part of back, unspecified stage: Secondary | ICD-10-CM | POA: Diagnosis present

## 2014-06-28 DIAGNOSIS — Z87891 Personal history of nicotine dependence: Secondary | ICD-10-CM

## 2014-06-28 DIAGNOSIS — K625 Hemorrhage of anus and rectum: Secondary | ICD-10-CM | POA: Diagnosis present

## 2014-06-28 DIAGNOSIS — I129 Hypertensive chronic kidney disease with stage 1 through stage 4 chronic kidney disease, or unspecified chronic kidney disease: Secondary | ICD-10-CM | POA: Diagnosis present

## 2014-06-28 DIAGNOSIS — Z7982 Long term (current) use of aspirin: Secondary | ICD-10-CM

## 2014-06-28 DIAGNOSIS — R197 Diarrhea, unspecified: Secondary | ICD-10-CM | POA: Diagnosis present

## 2014-06-28 DIAGNOSIS — E039 Hypothyroidism, unspecified: Secondary | ICD-10-CM

## 2014-06-28 DIAGNOSIS — L89150 Pressure ulcer of sacral region, unstageable: Secondary | ICD-10-CM

## 2014-06-28 DIAGNOSIS — N390 Urinary tract infection, site not specified: Secondary | ICD-10-CM | POA: Diagnosis present

## 2014-06-28 DIAGNOSIS — M810 Age-related osteoporosis without current pathological fracture: Secondary | ICD-10-CM | POA: Diagnosis present

## 2014-06-28 DIAGNOSIS — K648 Other hemorrhoids: Secondary | ICD-10-CM | POA: Diagnosis not present

## 2014-06-28 DIAGNOSIS — F0280 Dementia in other diseases classified elsewhere without behavioral disturbance: Secondary | ICD-10-CM | POA: Diagnosis present

## 2014-06-28 DIAGNOSIS — N182 Chronic kidney disease, stage 2 (mild): Secondary | ICD-10-CM

## 2014-06-28 DIAGNOSIS — L8994 Pressure ulcer of unspecified site, stage 4: Secondary | ICD-10-CM | POA: Diagnosis present

## 2014-06-28 DIAGNOSIS — M1990 Unspecified osteoarthritis, unspecified site: Secondary | ICD-10-CM | POA: Diagnosis present

## 2014-06-28 DIAGNOSIS — IMO0002 Reserved for concepts with insufficient information to code with codable children: Secondary | ICD-10-CM

## 2014-06-28 DIAGNOSIS — L8995 Pressure ulcer of unspecified site, unstageable: Secondary | ICD-10-CM

## 2014-06-28 DIAGNOSIS — K5731 Diverticulosis of large intestine without perforation or abscess with bleeding: Secondary | ICD-10-CM | POA: Diagnosis present

## 2014-06-28 DIAGNOSIS — G3183 Dementia with Lewy bodies: Secondary | ICD-10-CM

## 2014-06-28 DIAGNOSIS — M109 Gout, unspecified: Secondary | ICD-10-CM | POA: Diagnosis present

## 2014-06-28 DIAGNOSIS — K589 Irritable bowel syndrome without diarrhea: Secondary | ICD-10-CM | POA: Diagnosis present

## 2014-06-28 LAB — COMPREHENSIVE METABOLIC PANEL
ALBUMIN: 3.5 g/dL (ref 3.5–5.2)
ALT: 19 U/L (ref 0–53)
ANION GAP: 13 (ref 5–15)
AST: 36 U/L (ref 0–37)
Alkaline Phosphatase: 82 U/L (ref 39–117)
BUN: 18 mg/dL (ref 6–23)
CALCIUM: 9.7 mg/dL (ref 8.4–10.5)
CO2: 26 mEq/L (ref 19–32)
CREATININE: 1.05 mg/dL (ref 0.50–1.35)
Chloride: 100 mEq/L (ref 96–112)
GFR calc non Af Amer: 59 mL/min — ABNORMAL LOW (ref >90)
GFR, EST AFRICAN AMERICAN: 68 mL/min — AB (ref >90)
Glucose, Bld: 115 mg/dL — ABNORMAL HIGH (ref 70–99)
POTASSIUM: 5.1 meq/L (ref 3.7–5.3)
Sodium: 139 mEq/L (ref 137–147)
Total Bilirubin: 0.6 mg/dL (ref 0.3–1.2)
Total Protein: 8.2 g/dL (ref 6.0–8.3)

## 2014-06-28 LAB — CBC WITH DIFFERENTIAL/PLATELET
Basophils Absolute: 0 10*3/uL (ref 0.0–0.1)
Basophils Relative: 0 % (ref 0–1)
Eosinophils Absolute: 0 10*3/uL (ref 0.0–0.7)
Eosinophils Relative: 0 % (ref 0–5)
HCT: 37.1 % — ABNORMAL LOW (ref 39.0–52.0)
HEMOGLOBIN: 11.9 g/dL — AB (ref 13.0–17.0)
LYMPHS PCT: 13 % (ref 12–46)
Lymphs Abs: 1.1 10*3/uL (ref 0.7–4.0)
MCH: 28 pg (ref 26.0–34.0)
MCHC: 32.1 g/dL (ref 30.0–36.0)
MCV: 87.3 fL (ref 78.0–100.0)
MONOS PCT: 8 % (ref 3–12)
Monocytes Absolute: 0.7 10*3/uL (ref 0.1–1.0)
NEUTROS ABS: 6.6 10*3/uL (ref 1.7–7.7)
Neutrophils Relative %: 79 % — ABNORMAL HIGH (ref 43–77)
Platelets: 197 10*3/uL (ref 150–400)
RBC: 4.25 MIL/uL (ref 4.22–5.81)
RDW: 14.2 % (ref 11.5–15.5)
WBC: 8.4 10*3/uL (ref 4.0–10.5)

## 2014-06-28 LAB — POC OCCULT BLOOD, ED: Fecal Occult Bld: POSITIVE — AB

## 2014-06-28 LAB — TYPE AND SCREEN
ABO/RH(D): AB POS
ANTIBODY SCREEN: NEGATIVE

## 2014-06-28 LAB — TSH: TSH: 2.24 u[IU]/mL (ref 0.350–4.500)

## 2014-06-28 MED ORDER — BOOST PO LIQD
90.0000 mL | Freq: Two times a day (BID) | ORAL | Status: DC
  Administered 2014-06-28 – 2014-06-30 (×4): 90 mL via ORAL
  Filled 2014-06-28 (×5): qty 237

## 2014-06-28 MED ORDER — LEVOTHYROXINE SODIUM 25 MCG PO TABS
25.0000 ug | ORAL_TABLET | Freq: Every day | ORAL | Status: DC
  Administered 2014-06-29 – 2014-07-01 (×3): 25 ug via ORAL
  Filled 2014-06-28 (×5): qty 1

## 2014-06-28 MED ORDER — ONDANSETRON HCL 4 MG/2ML IJ SOLN: 4.0000 mg | Freq: Four times a day (QID) | INTRAMUSCULAR | Status: DC | PRN

## 2014-06-28 MED ORDER — ADULT MULTIVITAMIN W/MINERALS CH
1.0000 | ORAL_TABLET | Freq: Every day | ORAL | Status: DC
  Administered 2014-06-28 – 2014-07-01 (×4): 1 via ORAL
  Filled 2014-06-28 (×5): qty 1

## 2014-06-28 MED ORDER — HYDROCORTISONE ACETATE 25 MG RE SUPP
25.0000 mg | Freq: Two times a day (BID) | RECTAL | Status: DC
  Administered 2014-06-28 – 2014-07-01 (×6): 25 mg via RECTAL
  Filled 2014-06-28 (×9): qty 1

## 2014-06-28 MED ORDER — ACETAMINOPHEN 325 MG PO TABS
650.0000 mg | ORAL_TABLET | Freq: Four times a day (QID) | ORAL | Status: DC | PRN
  Administered 2014-06-30: 650 mg via ORAL
  Filled 2014-06-28: qty 2

## 2014-06-28 MED ORDER — ONDANSETRON HCL 4 MG PO TABS: 4.0000 mg | ORAL_TABLET | Freq: Four times a day (QID) | ORAL | Status: DC | PRN

## 2014-06-28 MED ORDER — GUAIFENESIN ER 600 MG PO TB12
600.0000 mg | ORAL_TABLET | Freq: Two times a day (BID) | ORAL | Status: DC | PRN
  Filled 2014-06-28: qty 1

## 2014-06-28 MED ORDER — PHILLIPS COLON HEALTH PO CAPS: 1.0000 | ORAL_CAPSULE | Freq: Two times a day (BID) | ORAL | Status: DC

## 2014-06-28 MED ORDER — ACETAMINOPHEN 650 MG RE SUPP
650.0000 mg | Freq: Four times a day (QID) | RECTAL | Status: DC | PRN
  Administered 2014-06-29: 650 mg via RECTAL
  Filled 2014-06-28: qty 1

## 2014-06-28 MED ORDER — VITAMIN B-12 1000 MCG PO TABS
1000.0000 ug | ORAL_TABLET | Freq: Two times a day (BID) | ORAL | Status: DC
  Administered 2014-06-28 – 2014-07-01 (×7): 1000 ug via ORAL
  Filled 2014-06-28 (×9): qty 1

## 2014-06-28 MED ORDER — VITAMIN B-12 5000 MCG SL SUBL: 5000.0000 ug | SUBLINGUAL_TABLET | Freq: Two times a day (BID) | SUBLINGUAL | Status: DC

## 2014-06-28 MED ORDER — SACCHAROMYCES BOULARDII 250 MG PO CAPS
250.0000 mg | ORAL_CAPSULE | Freq: Two times a day (BID) | ORAL | Status: DC
  Administered 2014-06-28 – 2014-07-01 (×7): 250 mg via ORAL
  Filled 2014-06-28 (×9): qty 1

## 2014-06-28 MED ORDER — SODIUM CHLORIDE 0.9 % IV SOLN
INTRAVENOUS | Status: AC
  Administered 2014-06-28 – 2014-06-29 (×2): via INTRAVENOUS

## 2014-06-28 MED ORDER — HYDRALAZINE HCL 25 MG PO TABS
12.5000 mg | ORAL_TABLET | Freq: Two times a day (BID) | ORAL | Status: DC
  Administered 2014-06-28 – 2014-06-30 (×5): 12.5 mg via ORAL
  Administered 2014-07-01: 10:00:00 via ORAL
  Administered 2014-07-01: 12.5 mg via ORAL
  Filled 2014-06-28 (×9): qty 0.5

## 2014-06-28 MED ORDER — MEMANTINE HCL 10 MG PO TABS
10.0000 mg | ORAL_TABLET | Freq: Two times a day (BID) | ORAL | Status: DC
  Administered 2014-06-28 – 2014-07-01 (×7): 10 mg via ORAL
  Filled 2014-06-28 (×9): qty 1

## 2014-06-28 NOTE — ED Notes (Signed)
Dr. Christy Gentles speaking with pt's son. Marcie Bal from social work called [phone]), states she will come speak with pt.

## 2014-06-28 NOTE — H&P (Signed)
[facility] History and Physical  Ricky [name]:[id number] DOB: Feb 21, 1917 DOA: 06/28/2014  Referring physician: Dr. Christy Gentles  PCP: Hollace Kinnier, DO   Chief Complaint: diarrhea, rectal bleeding  HPI: Ricky [name] is a 80 y.o. male with PMH of IBS, diverticulosis, HTN, alzheimer, hypothyroidism and decubitus ulcer; came to ED with hx of 2 days or so of diarrhea and subsequent development of bright Ricky blood per rectum. Patient denies abd pain, Fever, chills, CP, SOB, dysuria or any other complaints. In the ED patient with positive blood in rectal exam; Hgb 11.9 and patient completely hemodynamically stable. TRH called to admit in observation to follow Hgb trend, mild fluid resuscitation.   Review of Systems:  Unable to be completely reviewed given dementia; but denies fever, chills, nausea, vomiting, palpitations, dysuria or any other complaints.  Past Medical History  Diagnosis Date  . IBS (irritable bowel syndrome)   . Diverticulosis   . Cataract   . OAB (overactive bladder)   . Macular degeneration   . Alzheimer disease   . Hypertension   . Arthritis   . Osteoporosis   . Parkinsonian syndrome   . Syncope, vasovagal   . Neuromuscular disorder     parkisonian syndrome  . 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 Unspecified transient cerebral ischemia   . Unspecified urinary incontinence   . Senile dementia, uncomplicated   . Unspecified constipation   . Hypothyroidism   . Anemia, unspecified   . Depression   . Alzheimer's disease   . Gout, unspecified   . Thoracic or lumbosacral neuritis or radiculitis, unspecified   . Progressive supranuclear palsy   . Unspecified transient cerebral ischemia   . Unspecified urinary incontinence   . Syncope and collapse   . Paralysis agitans   . Unspecified transient cerebral ischemia   . Pneumonitis due to inhalation of food or vomitus   . Cellulitis and abscess of oral soft tissues   .  Screening for lipoid disorders   . Senile dementia, uncomplicated   . Unspecified constipation   . Disorder of bone and cartilage, unspecified   . Other specified acquired hypothyroidism   . Unspecified hypothyroidism   . Anemia, unspecified   . Depressive disorder, not elsewhere classified   . Alzheimer's disease   . Paralysis agitans   . Pneumonia, organism unspecified   . Pseudomonas infection in conditions classified elsewhere and of unspecified site   . Unspecified essential hypertension   . Urinary tract infection, site not specified   . Osteoarthrosis, unspecified whether generalized or localized, unspecified site   . Osteoporosis, unspecified   . Gout, unspecified   . Thoracic or lumbosacral neuritis or radiculitis, unspecified   . Stroke    Past Surgical History  Procedure Laterality Date  . Esophagogastroduodenoscopy    . Eye surgery  2006    LEFT CATARACT   Social History:  reports that he has quit smoking. He has never used smokeless tobacco. He reports that he does not drink alcohol or use illicit drugs.  Allergies  Allergen Reactions  . Other Other (See Comments)    Dairy products- cause swallowing difficulty     Family History  Problem Relation Age of Onset  . Cancer Mother   . Stroke Father   . Liver disease Father   . Kidney disease Brother   . Liver disease Brother   . Emphysema Brother      Prior to Admission medications   Medication Sig Start  Date End Date Taking? Authorizing Provider  acetaminophen (TYLENOL) 500 MG tablet Take 1,000 mg by mouth 3 (three) times daily.    Yes Historical Provider, MD  aspirin EC 325 MG tablet Take 325 mg by mouth every evening.   Yes Historical Provider, MD  bacitracin ointment Apply 1 application topically 2 (two) times daily. Applies to head of penis to decrease infection from catheter.   Yes Historical Provider, MD  Calcium Carbonate-Vit D-Min (CALTRATE 600+D PLUS) 600-800 MG-UNIT CHEW Chew 1 tablet by mouth 2  (two) times daily with a meal.    Yes Historical Provider, MD  clotrimazole-betamethasone (LOTRISONE) cream Apply 1 application topically 2 (two) times daily as needed (Applies to body for rash.).  02/01/14  Yes Historical Provider, MD  CRANBERRY SOFT PO Take 1 capsule by mouth 3 (three) times daily.   Yes Historical Provider, MD  Cyanocobalamin (VITAMIN B-12) 5000 MCG SUBL Place 5,000 mcg under the tongue 2 (two) times daily.   Yes Historical Provider, MD  D-Mannose POWD Take 2,000 mg by mouth 2 (two) times daily.   Yes Historical Provider, MD  ferrous sulfate 325 (65 FE) MG tablet Take 325 mg by mouth every other day.    Yes Historical Provider, MD  guaiFENesin (MUCINEX) 600 MG 12 hr tablet Take 600 mg by mouth 2 (two) times daily as needed for cough.   Yes Historical Provider, MD  hydrALAZINE (APRESOLINE) 25 MG tablet Take 12.5 mg by mouth 2 (two) times daily.   Yes Historical Provider, MD  lactose free nutrition (BOOST) LIQD Take 90 mLs by mouth 2 (two) times daily.   Yes Historical Provider, MD  levothyroxine (SYNTHROID, LEVOTHROID) 25 MCG tablet Take 25 mcg by mouth daily before breakfast.   Yes Historical Provider, MD  Melatonin 5 MG TABS Take 5 mg by mouth at bedtime as needed (For sleep.).   Yes Historical Provider, MD  meloxicam (MOBIC) 7.5 MG tablet Take 7.5 mg by mouth every evening.    Yes Historical Provider, MD  memantine (NAMENDA) 10 MG tablet Take 10 mg by mouth 2 (two) times daily.   Yes Historical Provider, MD  Multiple Vitamin (MULTIVITAMIN WITH MINERALS) TABS Take 1 tablet by mouth daily.   Yes Historical Provider, MD  Multiple Vitamins-Minerals (ICAPS AREDS FORMULA PO) Take 2 capsules by mouth 2 (two) times daily with a meal.    Yes Historical Provider, MD  polyethylene glycol (MIRALAX / GLYCOLAX) packet Take 17 g by mouth every evening.    Yes Historical Provider, MD  Probiotic Product ([place]) CAPS Take 1 capsule by mouth 2 (two) times daily.   Yes Historical  Provider, MD   Physical Exam: Filed Vitals:   06/28/14 1040 06/28/14 1314 06/28/14 1342  BP: 115/61 127/66 132/71  Pulse: 70 62 67  Temp: 98.5 F (36.9 C)    TempSrc: Oral    Resp: 18 18 18   SpO2: 94% 96% 99%    Wt Readings from Last 3 Encounters:  04/20/14 68.04 kg (150 lb)  03/31/14 64.184 kg (141 lb 8 oz)  03/29/14 71.668 kg (158 lb)    General:  Afebrile, in no acute distress; pleasant on exam and able to follow simple commands Eyes: PERRL, normal lids, irises & conjunctiva ENT: grossly normal hearing, lips & tongue Neck: no LAD, masses or thyromegaly, no JVD Cardiovascular: regular rate, no rubs or gallops; s1 and s2 appreciated Respiratory: CTA bilaterally Abdomen: soft, nt, ND, positive BS Skin: no rash or induration seen on exam  Musculoskeletal: grossly normal tone BUE/BLE Psychiatric: grossly normal mood and affect, speech fluent and appropriate Neurologic: no new focal deficit           Labs on Admission:  Basic Metabolic Panel:  Recent Labs Lab 06/28/14 1210  NA 139  K 5.1  CL 100  CO2 26  GLUCOSE 115*  BUN 18  CREATININE 1.05  CALCIUM 9.7   Liver Function Tests:  Recent Labs Lab 06/28/14 1210  AST 36  ALT 19  ALKPHOS 82  BILITOT 0.6  PROT 8.2  ALBUMIN 3.5   CBC:  Recent Labs Lab 06/28/14 1132  WBC 8.4  NEUTROABS 6.6  HGB 11.9*  HCT 37.1*  MCV 87.3  PLT 197   BNP (last 3 results)  Recent Labs  03/21/14 0312  PROBNP 3769.0*   Radiological Exams on Admission: No results found.  EKG: No acute ischemic changes appreciated.  Assessment/Plan 1-Rectal bleeding: patient with BRBPR; present after multiple episodes of diarrhea. -will admit to Med-Surg (patient is DNR and has nor mal HR/stable Hgb) -will follow Hgb trend -appears to be secondary to internal hemorrhoids, AVM, diverticulosis (had hx of it on the records) or even infections (C. Diff, given diarrhea) -will check for C. Diff -hold on laxatives -start empiric  hemorrhoidal suppository -follow clinical response and if bleeding intensifies or worsen, will involve GI -will keep in full liquid diet for now -avoid heparin products  2-HYPOTHYROIDISM: will check TSH. Continue synthroid  3-Alzheimer disease: unknown baseline, but oriented x2; confabulation present when answering/elaborating more info for questions  4-CKD (chronic kidney disease), stage II: stable and currently at baseline. Will monitor trend  5-Protein-calorie malnutrition, severe: continue feeding supplements.  6-Decubitus ulcer of sacral region, un-stageable: will add overlay mattress and ask wound care service recommendations for dressing care.   7-Diarrhea: will hold laxatives and check for C. Diff.   Code Status: DNR DVT Prophylaxis:SCD's Family Communication: no family at bedside Disposition Plan: med-surg; observation; LOS < 2 midnights  Time spent: 15 minutes  Barton Dubois [facility] Pager [phone]  **Disclaimer: This note may have been dictated with voice recognition software. Similar sounding words can inadvertently be transcribed and this note may contain transcription errors which may not have been corrected upon publication of note.**

## 2014-06-28 NOTE — ED Provider Notes (Signed)
CSN: [id number]     Arrival date & time 06/28/14  1039 History   First MD Initiated Contact with Patient 06/28/14 1049     Chief Complaint  Patient presents with  . Diarrhea  . Rectal Bleeding      Patient is a 80 y.o. male presenting with diarrhea and hematochezia. The history is provided by the EMS personnel. The history is limited by the condition of the patient.  Diarrhea Quality:  Bloody Severity:  Moderate Onset quality:  Gradual Duration:  1 day Timing:  Intermittent Progression:  Worsening Relieved by:  Nothing Worsened by:  Nothing tried Rectal Bleeding Pt presents from home for diarrhea and rectal bleeding Per EMS/nursing, pt noted to have diarrhea starting yesterday and was found to have blood in stool today He lives at home Son was here initially to give history but left before my evaluation.  Pt is unable to give any history at this time.  Past Medical History  Diagnosis Date  . IBS (irritable bowel syndrome)   . Diverticulosis   . Cataract   . OAB (overactive bladder)   . Macular degeneration   . Alzheimer disease   . Hypertension   . Arthritis   . Osteoporosis   . Parkinsonian syndrome   . Syncope, vasovagal   . Neuromuscular disorder     parkisonian syndrome  . 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 Unspecified transient cerebral ischemia   . Unspecified urinary incontinence   . Senile dementia, uncomplicated   . Unspecified constipation   . Hypothyroidism   . Anemia, unspecified   . Depression   . Alzheimer's disease   . Gout, unspecified   . Thoracic or lumbosacral neuritis or radiculitis, unspecified   . Progressive supranuclear palsy   . Unspecified transient cerebral ischemia   . Unspecified urinary incontinence   . Syncope and collapse   . Paralysis agitans   . Unspecified transient cerebral ischemia   . Pneumonitis due to inhalation of food or vomitus   . Cellulitis and abscess of oral soft tissues    . Screening for lipoid disorders   . Senile dementia, uncomplicated   . Unspecified constipation   . Disorder of bone and cartilage, unspecified   . Other specified acquired hypothyroidism   . Unspecified hypothyroidism   . Anemia, unspecified   . Depressive disorder, not elsewhere classified   . Alzheimer's disease   . Paralysis agitans   . Pneumonia, organism unspecified   . Pseudomonas infection in conditions classified elsewhere and of unspecified site   . Unspecified essential hypertension   . Urinary tract infection, site not specified   . Osteoarthrosis, unspecified whether generalized or localized, unspecified site   . Osteoporosis, unspecified   . Gout, unspecified   . Thoracic or lumbosacral neuritis or radiculitis, unspecified   . Stroke    Past Surgical History  Procedure Laterality Date  . Esophagogastroduodenoscopy    . Eye surgery  2006    LEFT CATARACT   Family History  Problem Relation Age of Onset  . Cancer Mother   . Stroke Father   . Liver disease Father   . Kidney disease Brother   . Liver disease Brother   . Emphysema Brother    History  Substance Use Topics  . Smoking status: Former Research scientist (life sciences)  . Smokeless tobacco: Never Used     Comment: QUIT SMOKING BACK IN THE LATE 50'S  . Alcohol Use: No    Review of Systems  Unable to perform ROS: Dementia  Gastrointestinal: Positive for diarrhea and hematochezia.      Allergies  Other  Home Medications   Prior to Admission medications   Medication Sig Start Date End Date Taking? Authorizing Provider  acetaminophen (TYLENOL) 500 MG tablet Take 1,000 mg by mouth 3 (three) times daily.     Historical Provider, MD  aspirin EC 325 MG EC tablet Take 1 tablet (325 mg total) by mouth daily. 02/03/13   Ripudeep Krystal Eaton, MD  atorvastatin (LIPITOR) 10 MG tablet Take 1 tablet (10 mg total) by mouth daily at 6 PM. 08/29/13 08/29/14  Tiffany L Reed, DO  bacitracin ointment Apply 1 application topically 2 (two)  times daily.    Historical Provider, MD  Calcium Carbonate-Vit D-Min (CALTRATE 600+D PLUS) 600-800 MG-UNIT CHEW Chew 1 tablet by mouth 2 (two) times daily with a meal.     Historical Provider, MD  clotrimazole-betamethasone (LOTRISONE) cream as needed. 02/01/14   Historical Provider, MD  CRANBERRY PO Take 400 mg by mouth 3 (three) times daily.     Historical Provider, MD  Cyanocobalamin (VITAMIN B-12) 2500 MCG SUBL Place 5,000 mcg under the tongue daily.     Historical Provider, MD  D-Mannose POWD Take 2,000 mg by mouth 2 (two) times daily.    Historical Provider, MD  ferrous sulfate 325 (65 FE) MG tablet Take 325 mg by mouth every other day.     Historical Provider, MD  hydrALAZINE (APRESOLINE) 25 MG tablet Take 12.5 mg by mouth 2 (two) times daily.    Historical Provider, MD  lactose free nutrition (BOOST) LIQD Take 90 mLs by mouth 2 (two) times daily between meals.     Historical Provider, MD  levothyroxine (SYNTHROID, LEVOTHROID) 25 MCG tablet Take 25 mcg by mouth daily before breakfast.    Historical Provider, MD  Melatonin 3 MG CAPS Take 6 mg by mouth at bedtime as needed (sleep).    Historical Provider, MD  meloxicam (MOBIC) 7.5 MG tablet Take 7.5 mg by mouth daily.    Historical Provider, MD  memantine (NAMENDA) 10 MG tablet Take 10 mg by mouth 2 (two) times daily.    Historical Provider, MD  Multiple Vitamin (MULTIVITAMIN WITH MINERALS) TABS Take 1 tablet by mouth daily.    Historical Provider, MD  Multiple Vitamins-Minerals (ICAPS AREDS FORMULA PO) Take 2 capsules by mouth 2 (two) times daily.    Historical Provider, MD  NAMENDA 10 MG tablet TAKE 1 TABLET BY MOUTH TWICE A DAY TO PRESERVE MEMORY    Estill Dooms, MD  polyethylene glycol (MIRALAX / GLYCOLAX) packet Take 17 g by mouth daily.    Historical Provider, MD  Probiotic Product (PROBIOTIC DAILY) CAPS Take 1 capsule by mouth daily. W/fiber probiotic    Historical Provider, MD  pseudoephedrine-guaifenesin (MUCINEX D) 60-600 MG per  tablet Take 1 tablet by mouth every 12 (twelve) hours as needed for congestion.    Historical Provider, MD   BP 115/61  Pulse 70  Temp(Src) 98.5 F (36.9 C) (Oral)  Resp 18  SpO2 94% Physical Exam CONSTITUTIONAL: elderly, frail HEAD: Normocephalic/atraumatic ENMT: Mucous membranes moist NECK: supple no meningeal signs SPINE:kyphosis noted CV: S1/S2 noted LUNGS: Lungs are clear to auscultation bilaterally, no apparent distress ABDOMEN: soft, nontender, no rebound or guarding RF:[id number] foley in place on arrival Rectal - bloody stool noted.  Chaperone present NEURO: Pt is awake/alert. He does not follow commands.  He will intermittently answer questions but is mostly nonverbal.  EXTREMITIES: pulses normal,contractures noted to extremities SKIN: warm, color normal.  He has wound vac in place to sacral region without drainage. PSYCH: unable to assess  ED Course  Procedures  11:34 AM Pt presents for bloody diarrhea He does not appear to be on current anticoagulants He has h/o dementia and does not contribute to history Workup pending at this time 12:36 PM Pt stable D/w son and daughter Apparently pt had nonbloody diarrhea yesterday, diarrhea this morning then began to "pour" out blood from rectum.  They confirm he is a DNR There is no personal/religious objections to receiving blood products 1:10 PM Pt stable D/w dr Dyann Kief will admit for rectal bleeding He requests I add on cdif by PCR for further evaluation Labs Review Labs Reviewed  CBC WITH DIFFERENTIAL - Abnormal; Notable for the following:    Hemoglobin 11.9 (*)    HCT 37.1 (*)    Neutrophils Relative % 79 (*)    All other components within normal limits  COMPREHENSIVE METABOLIC PANEL - Abnormal; Notable for the following:    Glucose, Bld 115 (*)    GFR calc non Af Amer 59 (*)    GFR calc Af Amer 68 (*)    All other components within normal limits  POC OCCULT BLOOD, ED - Abnormal; Notable for the following:     Fecal Occult Bld POSITIVE (*)    All other components within normal limits  CLOSTRIDIUM DIFFICILE BY PCR  TYPE AND SCREEN      MDM   Final diagnoses:  Rectal bleeding    Nursing notes including past medical history and social history reviewed and considered in documentation Labs/vital reviewed and considered Previous records reviewed and considered     Sharyon Cable, MD 06/28/14 1311

## 2014-06-28 NOTE — ED Notes (Signed)
Pt from home via [facility] c/o diarrhea since  Last p.m. And son noticed BRB in stool this a.m. Home health RN suggested be seen. Pt arrived with Foley catheter in place and wound vac in place which was reportedly placed this a.m. By home health..Pt is alert per baseline. Pt does not answer questions.

## 2014-06-28 NOTE — ED Notes (Signed)
Lab attempted multiple times unsuccessfully. Main lab called to collect

## 2014-06-28 NOTE — ED Notes (Signed)
BRB noted from rectum.

## 2014-06-28 NOTE — ED Notes (Signed)
Called to give report. Secretary states the pt will call back.

## 2014-06-28 NOTE — Clinical Social Work Psychosocial (Signed)
Clinical Social Work Department BRIEF PSYCHOSOCIAL ASSESSMENT 06/28/2014  Patient:  Ricky [name], Ricky [name]     Account Number:  [id number]     Admit date:  06/28/2014  Clinical Social Worker:  Daiva Huge  Date/Time:  06/28/2014 01:34 PM  Referred by:  CSW  Date Referred:  06/28/2014 Referred for  Other - See comment   Other Referral:   80YO MALE IN ED WITH ?NEEDS AT DC   Interview type:  Family Other interview type:   SON- REGINALD AT BEDSIDE    PSYCHOSOCIAL DATA Living Status:  FAMILY Admitted from facility:   Level of care:   Primary support name:  SON-REGINALD Primary support relationship to patient:  FAMILY Degree of support available:   GOOD- SON PROVIDES 24 HOUR CARE    CURRENT CONCERNS Current Concerns  Post-Acute Placement   Other Concerns:    SOCIAL WORK ASSESSMENT / PLAN Met with son at bedside- he advised CSW he has been caregiver for dad for many years- he provides 24 hour care to his father with a CNA (daily bath) and HHRN (wound care 3 times week). He reports that his father has become primarily immobile and bedbound without his assistance.  He physically assists him with transferring, dressing and meals, meds, etc.  He reports seeing a decline in the past few months especially with his intake- he had him seen by a GI doctor for ?PEG but this was decided not to be an option.  Son has tried SNF's in the past but has had "bad luck" with this- he is prepared to take him home once released (plans for and OBS admit).   Assessment/plan status:  Other - See comment Other assessment/ plan:   will update RNCM and have CSW follow up once admitted if further CSW needs arise.   Information/referral to community resources:   N/A    PATIENT'S/FAMILY'S RESPONSE TO PLAN OF CARE: Son is very involved and prepared to provide care to his father as he has been doing for many years- he has a sister who visits often (flight attendant) but he is the sole caregiver and  appears to be very equipped and committed to this plan.  CSW commended hiim for his caregiving to his father and the quality of care he provides- son appreciative and states, "we're buddy's".   Eduard Clos, MSW, [facility] Coverage

## 2014-06-28 NOTE — Progress Notes (Signed)
   CARE MANAGEMENT ED NOTE 06/28/2014  Patient:  Ricky [name], Ricky [name]   Account Number:  [id number]  Date Initiated:  06/28/2014  Documentation initiated by:  Jackelyn Poling  Subjective/Objective Assessment:   80 yr old [employer] hmo from home via [facility] c/o diarrhea since  Last p.m. And son noticed BRB in stool this a.m. Home health RN suggested be seen. Pt arrived with Foley catheter in place & wound vac intact placed this am Pt does not     Subjective/Objective Assessment Detail:   answer  pcp Dr Hollace Kinnier  Pt is not a good historian Pt did not know if he had f/u visits with pcp or any other provider upon CM assessment No family present at time for cm assessment     Action/Plan:   ED CM attempted to speak with pt but he was found not to be a good historian Noted followed by [facility] home health ED Cm spoke with Karolee Stamps at 61 to notify of pt admission for GI bleed She stated "i will send something over to you guys"   Action/Plan Detail:   Anticipated DC Date:  06/29/2014     Status Recommendation to Physician:   Result of Recommendation:    Other ED [place]  Other  Outpatient Services - Pt will follow up    Choice offered to / List presented to:       [facility] arranged  HH-1 RN      [facility] agency  [place]    Status of service:  Completed, signed off  ED Comments:   ED Comments Detail:

## 2014-06-28 NOTE — ED Notes (Signed)
Bed: WA18 Expected date:  Expected time:  Means of arrival:  Comments: EMS 

## 2014-06-28 NOTE — ED Notes (Signed)
unsuccesful attempts obtaining labs. Main Lab called

## 2014-06-29 ENCOUNTER — Observation Stay (HOSPITAL_COMMUNITY): Payer: Medicare HMO

## 2014-06-29 DIAGNOSIS — F0280 Dementia in other diseases classified elsewhere without behavioral disturbance: Secondary | ICD-10-CM | POA: Diagnosis present

## 2014-06-29 DIAGNOSIS — N390 Urinary tract infection, site not specified: Secondary | ICD-10-CM | POA: Diagnosis present

## 2014-06-29 DIAGNOSIS — N182 Chronic kidney disease, stage 2 (mild): Secondary | ICD-10-CM | POA: Diagnosis present

## 2014-06-29 DIAGNOSIS — IMO0002 Reserved for concepts with insufficient information to code with codable children: Secondary | ICD-10-CM | POA: Diagnosis not present

## 2014-06-29 DIAGNOSIS — K589 Irritable bowel syndrome without diarrhea: Secondary | ICD-10-CM | POA: Diagnosis present

## 2014-06-29 DIAGNOSIS — K625 Hemorrhage of anus and rectum: Secondary | ICD-10-CM | POA: Diagnosis present

## 2014-06-29 DIAGNOSIS — K5731 Diverticulosis of large intestine without perforation or abscess with bleeding: Secondary | ICD-10-CM | POA: Diagnosis present

## 2014-06-29 DIAGNOSIS — I129 Hypertensive chronic kidney disease with stage 1 through stage 4 chronic kidney disease, or unspecified chronic kidney disease: Secondary | ICD-10-CM | POA: Diagnosis present

## 2014-06-29 DIAGNOSIS — Z87891 Personal history of nicotine dependence: Secondary | ICD-10-CM | POA: Diagnosis not present

## 2014-06-29 DIAGNOSIS — Z8673 Personal history of transient ischemic attack (TIA), and cerebral infarction without residual deficits: Secondary | ICD-10-CM | POA: Diagnosis not present

## 2014-06-29 DIAGNOSIS — F3289 Other specified depressive episodes: Secondary | ICD-10-CM | POA: Diagnosis present

## 2014-06-29 DIAGNOSIS — E039 Hypothyroidism, unspecified: Secondary | ICD-10-CM | POA: Diagnosis present

## 2014-06-29 DIAGNOSIS — L89109 Pressure ulcer of unspecified part of back, unspecified stage: Secondary | ICD-10-CM | POA: Diagnosis present

## 2014-06-29 DIAGNOSIS — G3183 Dementia with Lewy bodies: Secondary | ICD-10-CM | POA: Diagnosis present

## 2014-06-29 DIAGNOSIS — E43 Unspecified severe protein-calorie malnutrition: Secondary | ICD-10-CM | POA: Diagnosis present

## 2014-06-29 DIAGNOSIS — M1990 Unspecified osteoarthritis, unspecified site: Secondary | ICD-10-CM | POA: Diagnosis present

## 2014-06-29 DIAGNOSIS — K648 Other hemorrhoids: Secondary | ICD-10-CM | POA: Diagnosis present

## 2014-06-29 DIAGNOSIS — Z7982 Long term (current) use of aspirin: Secondary | ICD-10-CM | POA: Diagnosis not present

## 2014-06-29 DIAGNOSIS — F329 Major depressive disorder, single episode, unspecified: Secondary | ICD-10-CM | POA: Diagnosis present

## 2014-06-29 DIAGNOSIS — M810 Age-related osteoporosis without current pathological fracture: Secondary | ICD-10-CM | POA: Diagnosis present

## 2014-06-29 DIAGNOSIS — Z66 Do not resuscitate: Secondary | ICD-10-CM | POA: Diagnosis present

## 2014-06-29 DIAGNOSIS — L8994 Pressure ulcer of unspecified site, stage 4: Secondary | ICD-10-CM | POA: Diagnosis present

## 2014-06-29 DIAGNOSIS — M109 Gout, unspecified: Secondary | ICD-10-CM | POA: Diagnosis present

## 2014-06-29 DIAGNOSIS — R197 Diarrhea, unspecified: Secondary | ICD-10-CM | POA: Diagnosis present

## 2014-06-29 LAB — BASIC METABOLIC PANEL
Anion gap: 13 (ref 5–15)
BUN: 15 mg/dL (ref 6–23)
CO2: 23 mEq/L (ref 19–32)
Calcium: 8.7 mg/dL (ref 8.4–10.5)
Chloride: 99 mEq/L (ref 96–112)
Creatinine, Ser: 0.77 mg/dL (ref 0.50–1.35)
GFR calc Af Amer: 87 mL/min — ABNORMAL LOW (ref >90)
GFR calc non Af Amer: 75 mL/min — ABNORMAL LOW (ref >90)
Glucose, Bld: 101 mg/dL — ABNORMAL HIGH (ref 70–99)
Potassium: 4 mEq/L (ref 3.7–5.3)
Sodium: 135 mEq/L — ABNORMAL LOW (ref 137–147)

## 2014-06-29 LAB — CBC
HCT: 31.6 % — ABNORMAL LOW (ref 39.0–52.0)
Hemoglobin: 10.5 g/dL — ABNORMAL LOW (ref 13.0–17.0)
MCH: 28.2 pg (ref 26.0–34.0)
MCHC: 33.2 g/dL (ref 30.0–36.0)
MCV: 84.9 fL (ref 78.0–100.0)
PLATELETS: 192 10*3/uL (ref 150–400)
RBC: 3.72 MIL/uL — AB (ref 4.22–5.81)
RDW: 13.8 % (ref 11.5–15.5)
WBC: 6.5 10*3/uL (ref 4.0–10.5)

## 2014-06-29 LAB — URINALYSIS, ROUTINE W REFLEX MICROSCOPIC
Bilirubin Urine: NEGATIVE
Glucose, UA: NEGATIVE mg/dL
Ketones, ur: NEGATIVE mg/dL
Nitrite: NEGATIVE
Protein, ur: 100 mg/dL — AB
Specific Gravity, Urine: 1.017 (ref 1.005–1.030)
Urobilinogen, UA: 0.2 mg/dL (ref 0.0–1.0)
pH: 8.5 — ABNORMAL HIGH (ref 5.0–8.0)

## 2014-06-29 LAB — URINE MICROSCOPIC-ADD ON

## 2014-06-29 LAB — MRSA PCR SCREENING: MRSA BY PCR: NEGATIVE

## 2014-06-29 MED ORDER — SODIUM CHLORIDE 0.9 % IV BOLUS (SEPSIS)
500.0000 mL | Freq: Once | INTRAVENOUS | Status: AC
  Administered 2014-06-29: 500 mL via INTRAVENOUS

## 2014-06-29 MED ORDER — CEFTRIAXONE SODIUM 1 G IJ SOLR
1.0000 g | INTRAMUSCULAR | Status: DC
  Administered 2014-06-29 – 2014-07-01 (×3): 1 g via INTRAVENOUS
  Filled 2014-06-29 (×4): qty 10

## 2014-06-29 NOTE — Progress Notes (Signed)
[facility] PROGRESS NOTE  Ricky [name] FUX:[id number] DOB: 12/26/1916 DOA: 06/28/2014 PCP: Hollace Kinnier, DO  Assessment/Plan: 1-Rectal bleeding: patient with BRBPR; present after multiple episodes of diarrhea.  -continue following Hgb trend (currently stable hemoglobin 10.5 ) -appears to be secondary to internal hemorrhoids and/or diverticulosis (had hx of it on the records) -will check for C. Diff (currently no further diarrhea, nausea or vomiting; making this less likely)  -continue holding on laxatives  -continue empiric hemorrhoidal suppository  -will keep on full liquid diet for now  -avoid heparin products   2-HYPOTHYROIDISM: TSH within normal limits. Continue synthroid   3-Alzheimer disease: unknown baseline, but oriented x2 with minimal engagement in conversations. -Continue supportive care. -Continue Namenda   4-CKD (chronic kidney disease), stage II: stable and currently at baseline. Will monitor and follow trend   5-Protein-calorie malnutrition, severe: continue feeding supplements.   6-Decubitus ulcer of sacral region, stage IV:  -Continue prevention measures with the use of overlay mattress  -Dressing changes as per wound care service recommendations  -wound-vac in place with negative pressure    7-Diarrhea: will hold laxatives and check for C. Diff. -No further diarrhea since admission.  8-low-grade fever overnight: UA suggesting UTI. -Follow urine cultures -Will start Rocephin empirically.  DVT: SCD's  Code Status: DNR Family Communication: son by phone. Disposition Plan: home with family care once patient afebrile for 24 hours.   Consultants:  None   Procedures:  See below for x-rays reports.   Antibiotics:  Rocephin (8/13)   HPI/Subjective: Patient spike low-grade fever overnight; no further episode of bloody stools or diarrhea.   Objective: Filed Vitals:   06/29/14 0608  BP: 126/71  Pulse: 75  Temp: 98.5 F (36.9 C)  Resp:  18    Intake/Output Summary (Last 24 hours) at 06/29/14 1456 Last data filed at 06/29/14 0254  Gross per 24 hour  Intake 423.33 ml  Output      0 ml  Net 423.33 ml   Filed Weights   06/28/14 1533  Weight: 72.67 kg (160 lb 3.3 oz)    Exam:   General:  NAD, afebrile, no further diarrhea or bloody stools since admission reported by nursing staff. Patient minimally engaging in conversation. Low grade fever overnight.  Cardiovascular: regular rate, no rubs, no gallops   Respiratory: good air movement bilaterally, no crackles, no wheezing   Abdomen: no distention, positive bowel sounds, soft, no guarding  Musculoskeletal: no cyanosis or clubbing.    skin: Patient with stage IV decubitus ulcer with dressings and wound VAC in place.  Data Reviewed: Basic Metabolic Panel:  Recent Labs Lab 06/28/14 1210 06/29/14 0515  NA 139 135*  K 5.1 4.0  CL 100 99  CO2 26 23  GLUCOSE 115* 101*  BUN 18 15  CREATININE 1.05 0.77  CALCIUM 9.7 8.7   Liver Function Tests:  Recent Labs Lab 06/28/14 1210  AST 36  ALT 19  ALKPHOS 82  BILITOT 0.6  PROT 8.2  ALBUMIN 3.5   CBC:  Recent Labs Lab 06/28/14 1132 06/29/14 0515  WBC 8.4 6.5  NEUTROABS 6.6  --   HGB 11.9* 10.5*  HCT 37.1* 31.6*  MCV 87.3 84.9  PLT 197 192   BNP (last 3 results)  Recent Labs  03/21/14 0312  PROBNP 3769.0*   CBG: No results found for this basename: GLUCAP,  in the last 168 hours  Recent Results (from the past 240 hour(s))  MRSA PCR SCREENING     Status: None  Collection Time    06/28/14 10:06 PM      Result Value Ref Range Status   MRSA by PCR NEGATIVE  NEGATIVE Final   Comment:            The GeneXpert MRSA Assay (FDA     approved for NASAL specimens     only), is one component of a     comprehensive MRSA colonization     surveillance program. It is not     intended to diagnose MRSA     infection nor to guide or     monitor treatment for     MRSA infections.     Studies: Dg  Chest 2 View  06/29/2014   CLINICAL DATA:  Fever  EXAM: CHEST  2 VIEW  COMPARISON:  04/12/2014  FINDINGS: Cardiomegaly again noted. Osteopenia and mild degenerative changes thoracic spine. Mild compression deformity mid thoracic spine is stable. No acute infiltrate or pulmonary edema.  IMPRESSION: No active cardiopulmonary disease.   Electronically Signed   By: Lahoma Crocker M.D.   On: 06/29/2014 08:56   Dg Abd 2 Views  06/28/2014   CLINICAL DATA:  Diarrhea.  EXAM: ABDOMEN - 2 VIEW  COMPARISON:  02/17/2012  FINDINGS: There is fairly extensive air in nondistended loops of large and small bowel. No free air or free fluid. There is a compression fracture of the superior endplate of L1, old. Moderate osteoarthritis of the hips, right greater than left, also stable.  IMPRESSION: No acute abnormalities.  Moderate air in nondistended bowel.   Electronically Signed   By: Rozetta Nunnery M.D.   On: 06/28/2014 20:37    Scheduled Meds: . cefTRIAXone (ROCEPHIN)  IV  1 g Intravenous Q24H  . hydrALAZINE  12.5 mg Oral BID  . hydrocortisone  25 mg Rectal BID  . lactose free nutrition  90 mL Oral BID  . levothyroxine  25 mcg Oral QAC breakfast  . memantine  10 mg Oral BID  . multivitamin with minerals  1 tablet Oral Daily  . saccharomyces boulardii  250 mg Oral BID  . vitamin B-12  1,000 mcg Oral BID   Continuous Infusions:   Principal Problem:   Rectal bleeding Active Problems:   HYPOTHYROIDISM   Alzheimer disease   CKD (chronic kidney disease), stage II   Protein-calorie malnutrition, severe   Decubitus ulcer of sacral region, unstageable   Diarrhea    Time spent: <30 minutes    Barton Dubois  [facility] Pager [phone]. If 7PM-7AM, please contact night-coverage at [email or web address], password [facility] 06/29/2014, 2:56 PM  LOS: 1 day

## 2014-06-29 NOTE — Consult Note (Signed)
WOC wound consult note Reason for Consult:Pressure ulcer to sacrum, present on admission.  Stage IV per [facility] nurse.  Negative Pressure Wound Therapy device in place.  Dressing last changed yesterday (Wednesday).  Has HH device in place, with power source in place.  Will not change dressing today as it was changed yesterday and patient is being discharged this evening. Wound type: Reported Stage IV pressure ulcer.  Pressure Ulcer POA: Yes/ Measurement:Not assessed this visit.  Patient is here for observation and is being discharged this evening.  HH negative pressure device in place with continuous suction at 120 mmHg as ordered. POwer source for device is in place and patient will be discharged with his home device.  There is no family at bedside to assume responsibility for device.   Wound bed:Not assessed this visit.  Drainage (amount, consistency, odor) Moderate, serosanguinous drainage.  No odor noted.  Periwound: Intact as visible through drape.  Dressing procedure/placement/frequency: Cleanse sacral ulcer with NS and pat gently dry.  NPWT dressing change Mon/Wed/Fri.  Patient to be discharged home with home NPWT Medela device.  Will not follow at this time.  Please re-consult if needed.  Domenic Moras RN BSN [place] Pager [phone]

## 2014-06-29 NOTE — Progress Notes (Signed)
UR Completed [place] Faulcon Graves-Bigelow, RN,BSN [phone]  

## 2014-06-29 NOTE — Progress Notes (Signed)
INITIAL NUTRITION ASSESSMENT  Pt meets criteria for severe MALNUTRITION in the context of chronic illness as evidenced by severe wasting in clavicles with <75% estimated energy intake in the past month per family.  DOCUMENTATION CODES Per approved criteria  -Severe malnutrition in the context of chronic illness   INTERVENTION: - Diet advancement per MD - was on dysphagia 2/thin at home - Recommend SLP evaluation if family interested as daughter reports pt with trouble swallowing x 10 months - Recommend Boost Plus TID when pt alert enough for PO - RD to continue to monitor   NUTRITION DIAGNOSIS: Inadequate oral intake related to poor appetite as evidenced by family report.   Goal: Advance diet as tolerated to dysphagia 2/thin liquids per home diet  Monitor:  Weights, labs, diet advancement, intake  Reason for Assessment: Malnutrition screening tool   80 y.o. male  Admitting Dx: Rectal bleeding  ASSESSMENT: Pt with hx of IBS, diverticulosis, HTN, alzheimer, hypothyroidism and decubitus ulcer; came to ED with hx of 2 days or so of diarrhea and subsequent development of bright red blood per rectum.  - Pt alone in room, observed pt with severe wasting in clavicles - Per telephone discussion with daughter, pt had variable intake at home depending on his mental state. Likes to drink fruit smoothies at times.  - She said that for the past 3 days he had only been consuming Pedialyte, water, and Boost because he refused to eat - She said pt has been losing weight but is unsure how much  - Reported pt has had difficulty swallowing for the past 10 months and has been on finely chopped meats at home - Michela Pitcher pt has hx of being seen by SLP and sometimes uses thickener at home - She reported overall pt's intake has been poor for the past 2 months - Full liquid diet order however RN said that when pt's son came by to visit he didn't want him to have any PO due to concern for being too lethargic     Height: Ht Readings from Last 1 Encounters:  06/28/14 5\' 10"  (1.778 m)    Weight: Wt Readings from Last 1 Encounters:  06/28/14 160 lb 3.3 oz (72.67 kg)    Ideal Body Weight: 166 lbs   % Ideal Body Weight: 96%  Wt Readings from Last 10 Encounters:  06/28/14 160 lb 3.3 oz (72.67 kg)  04/20/14 150 lb (68.04 kg)  03/31/14 141 lb 8 oz (64.184 kg)  03/29/14 158 lb (71.668 kg)  03/28/14 153 lb 11.2 oz (69.718 kg)  03/13/14 145 lb 15.1 oz (66.2 kg)  01/16/14 150 lb 2.1 oz (68.1 kg)  10/31/13 170 lb (77.111 kg)  10/20/13 175 lb (79.379 kg)  09/09/13 175 lb (79.379 kg)    Usual Body Weight: 150 lbs 2 months ago  % Usual Body Weight: 107%  BMI:  Body mass index is 22.99 kg/(m^2).  Estimated Nutritional Needs: Kcal: 2200-2500 Protein: 110-130g Fluid: per MD  Skin: Unstageable sacral pressure ulcer with wound VAC  Diet Order: Full Liquid  EDUCATION NEEDS: -No education needs identified at this time   Intake/Output Summary (Last 24 hours) at 06/29/14 1550 Last data filed at 06/29/14 0635  Gross per 24 hour  Intake 423.33 ml  Output      0 ml  Net 423.33 ml    Last BM: 8/12  Labs:   Recent Labs Lab 06/28/14 1210 06/29/14 0515  NA 139 135*  K 5.1 4.0  CL 100 99  CO2 26 23  BUN 18 15  CREATININE 1.05 0.77  CALCIUM 9.7 8.7  GLUCOSE 115* 101*    CBG (last 3)  No results found for this basename: GLUCAP,  in the last 72 hours  Scheduled Meds: . cefTRIAXone (ROCEPHIN)  IV  1 g Intravenous Q24H  . hydrALAZINE  12.5 mg Oral BID  . hydrocortisone  25 mg Rectal BID  . lactose free nutrition  90 mL Oral BID  . levothyroxine  25 mcg Oral QAC breakfast  . memantine  10 mg Oral BID  . multivitamin with minerals  1 tablet Oral Daily  . saccharomyces boulardii  250 mg Oral BID  . vitamin B-12  1,000 mcg Oral BID    Continuous Infusions:   Past Medical History  Diagnosis Date  . IBS (irritable bowel syndrome)   . Diverticulosis   . Cataract   . OAB  (overactive bladder)   . Macular degeneration   . Alzheimer disease   . Hypertension   . Arthritis   . Osteoporosis   . Parkinsonian syndrome   . Syncope, vasovagal   . Neuromuscular disorder     parkisonian syndrome  . 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 Unspecified transient cerebral ischemia   . Unspecified urinary incontinence   . Senile dementia, uncomplicated   . Unspecified constipation   . Hypothyroidism   . Anemia, unspecified   . Depression   . Alzheimer's disease   . Gout, unspecified   . Thoracic or lumbosacral neuritis or radiculitis, unspecified   . Progressive supranuclear palsy   . Unspecified transient cerebral ischemia   . Unspecified urinary incontinence   . Syncope and collapse   . Paralysis agitans   . Unspecified transient cerebral ischemia   . Pneumonitis due to inhalation of food or vomitus   . Cellulitis and abscess of oral soft tissues   . Screening for lipoid disorders   . Senile dementia, uncomplicated   . Unspecified constipation   . Disorder of bone and cartilage, unspecified   . Other specified acquired hypothyroidism   . Unspecified hypothyroidism   . Anemia, unspecified   . Depressive disorder, not elsewhere classified   . Alzheimer's disease   . Paralysis agitans   . Pneumonia, organism unspecified   . Pseudomonas infection in conditions classified elsewhere and of unspecified site   . Unspecified essential hypertension   . Urinary tract infection, site not specified   . Osteoarthrosis, unspecified whether generalized or localized, unspecified site   . Osteoporosis, unspecified   . Gout, unspecified   . Thoracic or lumbosacral neuritis or radiculitis, unspecified   . Stroke     Past Surgical History  Procedure Laterality Date  . Esophagogastroduodenoscopy    . Eye surgery  2006    LEFT CATARACT    Carlis Stable MS, [place], [place] Pager [phone] Weekend/After Hours Pager

## 2014-06-29 NOTE — Progress Notes (Signed)
I called and spoke with his daughter [name] about the wound vac pt has from home and let her know that the battery had died and we had no way to charge or change the battery with that particular machine. The daughter stated that she was out of town and could not get it here herself but spoke with her brother whom the pt apparently lives with and that he would try and have the battery here within the next 2 hours. WOC consult is ordered. Will continue to monitor.

## 2014-06-30 LAB — CBC
HCT: 34.4 % — ABNORMAL LOW (ref 39.0–52.0)
HEMOGLOBIN: 10.9 g/dL — AB (ref 13.0–17.0)
MCH: 27.7 pg (ref 26.0–34.0)
MCHC: 31.7 g/dL (ref 30.0–36.0)
MCV: 87.3 fL (ref 78.0–100.0)
PLATELETS: 168 10*3/uL (ref 150–400)
RBC: 3.94 MIL/uL — AB (ref 4.22–5.81)
RDW: 13.9 % (ref 11.5–15.5)
WBC: 5.9 10*3/uL (ref 4.0–10.5)

## 2014-06-30 MED ORDER — BOOST PO LIQD
90.0000 mL | Freq: Three times a day (TID) | ORAL | Status: DC
  Administered 2014-06-30 – 2014-07-01 (×4): 90 mL via ORAL
  Filled 2014-06-30 (×4): qty 237

## 2014-06-30 NOTE — Consult Note (Signed)
WOC wound consult note Contacted by hospitalist that pt was not dc to home yesterday.  Therefore will need to switched to hospital NPWT VAC dressing today. May dc over weekend. Reason for Consult: placement of NPWT VAC dressing  Wound type: Stage IV Pressure Ulcer sacrum  Pressure Ulcer POA: Yes Measurement:2.0cm x 2.5cm x 2.0cm with tunnel at 12 o'clock 3.0cm  Wound bed: mostly clean, less than 25% slough, pink, moist. Epibole of the wound edges  Drainage (amount, consistency, odor) minimal serosanguinous in the Medela canister (NPWT device) at the bedside. Periwound: macerated a bit  Dressing procedure/placement/frequency: 1pc of black granufoam cut with split to fill the tunneled area and skin protected to the right hip for bridge to accommodate TRAC pad and avoid patient lying on tubing.  Orders written to continue NPWT VAC until dc.  At that point bedside nursing to remove VAC dressing and replace with NS moist gauze dressing for travel to home. CM and bedside nursing to make sure home (Medela) unit to go home with patient at the time of discharge and arrange [facility] to replace new NPWT dressing once at home. Air mattress in place for offloading. Pt tolerated but does have discomfort with dressing change.   [place] team will follow along with you for [facility] dressing change if needed on Monday due to need to bridge the wound from the sacrum.   [address], [place], [place]

## 2014-06-30 NOTE — Progress Notes (Signed)
[facility] PROGRESS NOTE  Ricky [name] YTK:[id number] DOB: 08/03/1917 DOA: 06/28/2014 PCP: Hollace Kinnier, DO  Assessment/Plan: 1-Rectal bleeding: patient with BRBPR; present after multiple episodes of diarrhea.  -continue following Hgb trend (currently stable hemoglobin 10.9 ) -appears to be secondary to internal hemorrhoids and/or diverticulosis (had hx of it on the records) -will check for C. Diff (currently no further diarrhea, nausea or vomiting; making this less likely)  -continue holding on laxatives  -continue empiric hemorrhoidal suppository  -will advance to dysphagia 2/thin liquids as taking at home -avoid heparin products   2-HYPOTHYROIDISM: TSH within normal limits. Continue synthroid   3-Alzheimer disease: unknown baseline, but oriented x2 with minimal engagement in conversations. -Continue supportive care. -Continue Namenda   4-CKD (chronic kidney disease), stage II: stable and currently at baseline. Will monitor and follow trend   5-Protein-calorie malnutrition, severe: continue feeding supplements.  -Boost TID  6-Decubitus ulcer of sacral region, stage IV:  -Continue prevention measures with the use of overlay mattress  -Dressing changes as per wound care service recommendations  -wound-vac in place with negative pressure    7-Diarrhea: will hold laxatives and check for C. Diff. -No further diarrhea since admission.  8-low-grade fever overnight: UA suggesting UTI. -Follow urine cultures -Will continue Rocephin empirically. -will like 24 hours w/o fever before transition abx's to PO  DVT: SCD's  Code Status: DNR Family Communication: son by phone. Disposition Plan: home with family care once patient afebrile for 24 hours.; most likely home in am   Consultants:  None   Procedures:  See below for x-rays reports.   Antibiotics:  Rocephin (8/13)   HPI/Subjective: Patient afebrile; no further blood in stool or diarrhea since  admission.  Objective: Filed Vitals:   06/30/14 1412  BP: 178/93  Pulse: 68  Temp: 99.6 F (37.6 C)  Resp: 18    Intake/Output Summary (Last 24 hours) at 06/30/14 1510 Last data filed at 06/30/14 1414  Gross per 24 hour  Intake    650 ml  Output   1875 ml  Net  -1225 ml   Filed Weights   06/28/14 1533  Weight: 72.67 kg (160 lb 3.3 oz)    Exam:   General:  NAD, afebrile, no further diarrhea or bloody stools since admission. Afebrile  Cardiovascular: regular rate, no rubs, no gallops   Respiratory: good air movement bilaterally, no crackles, no wheezing   Abdomen: no distention, positive bowel sounds, soft, no guarding  Musculoskeletal: no cyanosis or clubbing.    skin: Patient with stage IV decubitus ulcer with dressings and wound VAC in place.  Data Reviewed: Basic Metabolic Panel:  Recent Labs Lab 06/28/14 1210 06/29/14 0515  NA 139 135*  K 5.1 4.0  CL 100 99  CO2 26 23  GLUCOSE 115* 101*  BUN 18 15  CREATININE 1.05 0.77  CALCIUM 9.7 8.7   Liver Function Tests:  Recent Labs Lab 06/28/14 1210  AST 36  ALT 19  ALKPHOS 82  BILITOT 0.6  PROT 8.2  ALBUMIN 3.5   CBC:  Recent Labs Lab 06/28/14 1132 06/29/14 0515 06/30/14 0455  WBC 8.4 6.5 5.9  NEUTROABS 6.6  --   --   HGB 11.9* 10.5* 10.9*  HCT 37.1* 31.6* 34.4*  MCV 87.3 84.9 87.3  PLT 197 192 168   BNP (last 3 results)  Recent Labs  03/21/14 0312  PROBNP 3769.0*   CBG: No results found for this basename: GLUCAP,  in the last 168 hours  Recent Results (  from the past 240 hour(s))  MRSA PCR SCREENING     Status: None   Collection Time    06/28/14 10:06 PM      Result Value Ref Range Status   MRSA by PCR NEGATIVE  NEGATIVE Final   Comment:            The GeneXpert MRSA Assay (FDA     approved for NASAL specimens     only), is one component of a     comprehensive MRSA colonization     surveillance program. It is not     intended to diagnose MRSA     infection nor to guide  or     monitor treatment for     MRSA infections.     Studies: Dg Chest 2 View  06/29/2014   CLINICAL DATA:  Fever  EXAM: CHEST  2 VIEW  COMPARISON:  04/12/2014  FINDINGS: Cardiomegaly again noted. Osteopenia and mild degenerative changes thoracic spine. Mild compression deformity mid thoracic spine is stable. No acute infiltrate or pulmonary edema.  IMPRESSION: No active cardiopulmonary disease.   Electronically Signed   By: Lahoma Crocker M.D.   On: 06/29/2014 08:56   Dg Abd 2 Views  06/28/2014   CLINICAL DATA:  Diarrhea.  EXAM: ABDOMEN - 2 VIEW  COMPARISON:  02/17/2012  FINDINGS: There is fairly extensive air in nondistended loops of large and small bowel. No free air or free fluid. There is a compression fracture of the superior endplate of L1, old. Moderate osteoarthritis of the hips, right greater than left, also stable.  IMPRESSION: No acute abnormalities.  Moderate air in nondistended bowel.   Electronically Signed   By: Rozetta Nunnery M.D.   On: 06/28/2014 20:37    Scheduled Meds: . cefTRIAXone (ROCEPHIN)  IV  1 g Intravenous Q24H  . hydrALAZINE  12.5 mg Oral BID  . hydrocortisone  25 mg Rectal BID  . lactose free nutrition  90 mL Oral TID BM  . levothyroxine  25 mcg Oral QAC breakfast  . memantine  10 mg Oral BID  . multivitamin with minerals  1 tablet Oral Daily  . saccharomyces boulardii  250 mg Oral BID  . vitamin B-12  1,000 mcg Oral BID   Continuous Infusions:   Principal Problem:   Rectal bleeding Active Problems:   HYPOTHYROIDISM   Alzheimer disease   CKD (chronic kidney disease), stage II   UTI (lower urinary tract infection)   Protein-calorie malnutrition, severe   Decubitus ulcer of sacral region, unstageable   Diarrhea    Time spent: <30 minutes    Barton Dubois  [facility] Pager [phone]. If 7PM-7AM, please contact night-coverage at [email or web address], password [facility] 06/30/2014, 3:10 PM  LOS: 2 days

## 2014-06-30 NOTE — Care Management Note (Signed)
    Page 1 of 2   06/30/2014     11:02:31 AM CARE MANAGEMENT NOTE 06/30/2014  Patient:  Ricky [name], Ricky [name]   Account Number:  [id number]  Date Initiated:  06/30/2014  Documentation initiated by:  Sunday Spillers  Subjective/Objective Assessment:   80 yo male admitted with BRBPR. PTA lived at home with son. Home health provided by [facility].     Action/Plan:   Return home when stable, resume HH, wound vac   Anticipated DC Date:  07/02/2014   Anticipated DC Plan:  [place] referral  Clinical Social Worker      DC Planning Services  CM consult      [facility] Choice  Resumption Of Svcs/PTA Provider   Choice offered to / List presented to:  C-4 Adult Children   DME arranged  VAC        HH arranged  HH-1 RN  HH-10 DISEASE MANAGEMENT      [place] agency  [facility]   Status of service:  Completed, signed off Medicare Important Message given?   (If response is "NO", the following Medicare IM given date fields will be blank) Date Medicare IM given:   Medicare IM given by:   Date Additional Medicare IM given:   Additional Medicare IM given by:    Discharge Disposition:  [place]  Per UR Regulation:  Reviewed for med. necessity/level of care/duration of stay  If discussed at [place] of Stay Meetings, dates discussed:    Comments:  06-30-14 Sunday Spillers RN [place] with Karolee Stamps from [facility] [phone]) who confirms patient is active with them. Discussed wound vac and need for change in hospital, now has [employer]. Per Karolee Stamps, [employer] not covered by [facility] so Medela would need to be restarted for home care. Per Karolee Stamps, ok to send home with wet to dry and home vac can be restarted after d/c. Son is very familiar with vac and how to use. Alvis Lemmings will f/u with patient at d/c. Will continue to follow for d/c needs.

## 2014-07-01 DIAGNOSIS — R627 Adult failure to thrive: Secondary | ICD-10-CM

## 2014-07-01 LAB — CBC
HCT: 34.7 % — ABNORMAL LOW (ref 39.0–52.0)
HEMOGLOBIN: 11.1 g/dL — AB (ref 13.0–17.0)
MCH: 27.8 pg (ref 26.0–34.0)
MCHC: 32 g/dL (ref 30.0–36.0)
MCV: 86.8 fL (ref 78.0–100.0)
Platelets: 200 10*3/uL (ref 150–400)
RBC: 4 MIL/uL — ABNORMAL LOW (ref 4.22–5.81)
RDW: 13.8 % (ref 11.5–15.5)
WBC: 5.4 10*3/uL (ref 4.0–10.5)

## 2014-07-01 MED ORDER — BOOST PO LIQD
90.0000 mL | Freq: Three times a day (TID) | ORAL | Status: AC
Start: 2014-07-01 — End: ?

## 2014-07-01 MED ORDER — POLYETHYLENE GLYCOL 3350 17 G PO PACK
17.0000 g | PACK | Freq: Every day | ORAL | Status: AC | PRN
Start: 2014-07-01 — End: ?

## 2014-07-01 MED ORDER — CEFUROXIME AXETIL 500 MG PO TABS: 500.0000 mg | ORAL_TABLET | Freq: Two times a day (BID) | ORAL | Status: AC

## 2014-07-01 NOTE — Progress Notes (Signed)
Contacted son r/t patient being discharged. [name], his son, stated" I'm picking my sister up this evening and we will be up there to see him and then we will determine whether he is at his baseline and can go home." Dr. Dyann Kief contacted r/t to son's statement.

## 2014-07-01 NOTE — Discharge Summary (Signed)
Physician Discharge Summary  Ricky [name] ZMO:[id number] DOB: 13-Dec-1916 DOA: 06/28/2014  PCP: Hollace Kinnier, DO  Admit date: 06/28/2014 Discharge date: 07/01/2014  Time spent: >30 minutes  Recommendations for Outpatient Follow-up:  1. CBC to follow Hgb trend 2. BMET to follow electrolytes and renal function  Discharge Diagnoses:  Principal Problem:   Rectal bleeding Active Problems:   HYPOTHYROIDISM   Alzheimer disease   CKD (chronic kidney disease), stage II   UTI (lower urinary tract infection)   Protein-calorie malnutrition, severe   Decubitus ulcer of sacral region, unstageable   Diarrhea   Discharge Condition: stable and improved. Afebrile for 36 hours. No further episodes of diarrhea or blood in his stool.  Diet recommendation: dysphagia 2 with thin liquids. Boost feeding supplements TID between meals   Filed Weights   06/28/14 1533  Weight: 72.67 kg (160 lb 3.3 oz)    History of present illness:  80 y.o. male with PMH of IBS, diverticulosis, HTN, alzheimer, hypothyroidism and decubitus ulcer; came to ED with hx of 2 days or so of diarrhea and subsequent development of bright red blood per rectum. Patient denies abd pain, Fever, chills, CP, SOB, dysuria or any other complaints. In the ED patient with positive blood in rectal exam; Hgb 11.9 and patient completely hemodynamically stable. [facility] called to admit in observation to follow Hgb trend, mild fluid resuscitation.    Hospital Course:  1-Rectal bleeding: patient with BRBPR; present after multiple episodes of diarrhea.  -continue following Hgb trend (currently stable hemoglobin 11.1 at discharge)  -appears to be secondary to internal hemorrhoids and/or diverticulosis (had hx of it on the records)  -will recommend stopping the use of Mobic (especially while taking Aspirin) -continue laxatives PRN now -follow up with PCP in 2 weeks  2-HYPOTHYROIDISM: TSH within normal limits. Continue synthroid   3-Alzheimer  disease: unknown baseline, but oriented x2 with minimal engagement in conversations.  -Continue supportive care.  -Continue Namenda   4-CKD (chronic kidney disease), stage II: stable and currently at baseline. Will monitor and follow trend   5-Protein-calorie malnutrition, severe: continue feeding supplements.  -Boost TID   6-Decubitus ulcer of sacral region, stage IV:  -Continue prevention measures with the use of overlay mattress  -Dressing changes as per wound care services -wound-vac in place with negative pressure   7-Diarrhea:  -No further diarrhea since admission. -recommend use of laxatives in as needed basis to prevent diarrhea   8-low-grade fever overnight: UA suggesting UTI.  -treated with 2 days of  IV Rocephin.  -once afebrile for 36 hours abx's switch to PO ceftin and will complete a total of 10 days   Procedures: See below for x-ray reports  Consultations:  None   Discharge Exam: Filed Vitals:   07/01/14 1356  BP: 138/79  Pulse: 69  Temp: 98.3 F (36.8 C)  Resp: 17   General: NAD, afebrile, no further diarrhea or bloody stools since admission. Afebrile  Cardiovascular: regular rate, no rubs, no gallops  Respiratory: good air movement bilaterally, no crackles, no wheezing  Abdomen: no distention, positive bowel sounds, soft, no guarding  Musculoskeletal: no cyanosis or clubbing.  Skin: Patient with stage IV decubitus ulcer with dressings and wound VAC in place.  Discharge Instructions You were cared for by a hospitalist during your hospital stay. If you have any questions about your discharge medications or the care you received while you were in the hospital after you are discharged, you can call the unit and asked to speak with  the hospitalist on call if the hospitalist that took care of you is not available. Once you are discharged, your primary care physician will handle any further medical issues. Please note that NO REFILLS for any discharge  medications will be authorized once you are discharged, as it is imperative that you return to your primary care physician (or establish a relationship with a primary care physician if you do not have one) for your aftercare needs so that they can reassess your need for medications and monitor your lab values.  Discharge Instructions   Discharge instructions    Complete by:  As directed   Take medications as prescribed Follow with PCP in 2 weeks Continue [facility] nurse for wound care Maintain a good hydration and encourage PO intake Mobic has been discontinue given concomitant use of aspirin and increase risk for bleeding (until follow up with PCP)            Medication List    STOP taking these medications       meloxicam 7.5 MG tablet  Commonly known as:  MOBIC      TAKE these medications       acetaminophen 500 MG tablet  Commonly known as:  TYLENOL  Take 1,000 mg by mouth 3 (three) times daily.     aspirin EC 325 MG tablet  Take 325 mg by mouth every evening.     bacitracin ointment  Apply 1 application topically 2 (two) times daily. Applies to head of penis to decrease infection from catheter.     CALTRATE 600+D PLUS MINERALS 600-800 MG-UNIT Chew  Chew 1 tablet by mouth 2 (two) times daily with a meal.     cefUROXime 500 MG tablet  Commonly known as:  CEFTIN  Take 1 tablet (500 mg total) by mouth 2 (two) times daily with a meal.     clotrimazole-betamethasone cream  Commonly known as:  LOTRISONE  Apply 1 application topically 2 (two) times daily as needed (Applies to body for rash.).     CRANBERRY SOFT PO  Take 1 capsule by mouth 3 (three) times daily.     D-Mannose Powd  Take 2,000 mg by mouth 2 (two) times daily.     ferrous sulfate 325 (65 FE) MG tablet  Take 325 mg by mouth every other day.     guaiFENesin 600 MG 12 hr tablet  Commonly known as:  MUCINEX  Take 600 mg by mouth 2 (two) times daily as needed for cough.     hydrALAZINE 25 MG tablet  Commonly  known as:  APRESOLINE  Take 12.5 mg by mouth 2 (two) times daily.     ICAPS AREDS FORMULA PO  Take 2 capsules by mouth 2 (two) times daily with a meal.     lactose free nutrition Liqd  Take 90 mLs by mouth 3 (three) times daily between meals.     levothyroxine 25 MCG tablet  Commonly known as:  SYNTHROID, LEVOTHROID  Take 25 mcg by mouth daily before breakfast.     Melatonin 5 MG Tabs  Take 5 mg by mouth at bedtime as needed (For sleep.).     memantine 10 MG tablet  Commonly known as:  NAMENDA  Take 10 mg by mouth 2 (two) times daily.     multivitamin with minerals Tabs tablet  Take 1 tablet by mouth daily.     PHILLIPS COLON HEALTH Caps  Take 1 capsule by mouth 2 (two) times daily.  polyethylene glycol packet  Commonly known as:  MIRALAX / GLYCOLAX  Take 17 g by mouth daily as needed for mild constipation.     Vitamin B-12 5000 MCG Subl  Place 5,000 mcg under the tongue 2 (two) times daily.       Allergies  Allergen Reactions  . Other Other (See Comments)    Dairy products- cause swallowing difficulty        Follow-up Information   Follow up with REED, TIFFANY, DO In 2 weeks.   Specialty:  Geriatric Medicine   Contact information:   [address] [place] [address] [phone]       The results of significant diagnostics from this hospitalization (including imaging, microbiology, ancillary and laboratory) are listed below for reference.    Significant Diagnostic Studies: Dg Chest 2 View  06/29/2014   CLINICAL DATA:  Fever  EXAM: CHEST  2 VIEW  COMPARISON:  04/12/2014  FINDINGS: Cardiomegaly again noted. Osteopenia and mild degenerative changes thoracic spine. Mild compression deformity mid thoracic spine is stable. No acute infiltrate or pulmonary edema.  IMPRESSION: No active cardiopulmonary disease.   Electronically Signed   By: Lahoma Crocker M.D.   On: 06/29/2014 08:56   Dg Abd 2 Views  06/28/2014   CLINICAL DATA:  Diarrhea.  EXAM: ABDOMEN - 2 VIEW   COMPARISON:  02/17/2012  FINDINGS: There is fairly extensive air in nondistended loops of large and small bowel. No free air or free fluid. There is a compression fracture of the superior endplate of L1, old. Moderate osteoarthritis of the hips, right greater than left, also stable.  IMPRESSION: No acute abnormalities.  Moderate air in nondistended bowel.   Electronically Signed   By: Rozetta Nunnery M.D.   On: 06/28/2014 20:37    Microbiology: Recent Results (from the past 240 hour(s))  MRSA PCR SCREENING     Status: None   Collection Time    06/28/14 10:06 PM      Result Value Ref Range Status   MRSA by PCR NEGATIVE  NEGATIVE Final   Comment:            The GeneXpert MRSA Assay (FDA     approved for NASAL specimens     only), is one component of a     comprehensive MRSA colonization     surveillance program. It is not     intended to diagnose MRSA     infection nor to guide or     monitor treatment for     MRSA infections.     Labs: Basic Metabolic Panel:  Recent Labs Lab 06/28/14 1210 06/29/14 0515  NA 139 135*  K 5.1 4.0  CL 100 99  CO2 26 23  GLUCOSE 115* 101*  BUN 18 15  CREATININE 1.05 0.77  CALCIUM 9.7 8.7   Liver Function Tests:  Recent Labs Lab 06/28/14 1210  AST 36  ALT 19  ALKPHOS 82  BILITOT 0.6  PROT 8.2  ALBUMIN 3.5   CBC:  Recent Labs Lab 06/28/14 1132 06/29/14 0515 06/30/14 0455 07/01/14 0542  WBC 8.4 6.5 5.9 5.4  NEUTROABS 6.6  --   --   --   HGB 11.9* 10.5* 10.9* 11.1*  HCT 37.1* 31.6* 34.4* 34.7*  MCV 87.3 84.9 87.3 86.8  PLT 197 192 168 200   BNP (last 3 results)  Recent Labs  03/21/14 0312  PROBNP 3769.0*    Signed:  Barton Dubois  [facility] 07/01/2014, 3:37 PM

## 2014-07-01 NOTE — Progress Notes (Signed)
Ricky [name] daughter Ricky [name] and son at bedside to take pt home, pt is alert, v/s are stable, IV d/c,  Wound vac disconnected for pt travel,  Night time med given per family request, family verbalized understanding of discharge and follow up  Instructions no concern verbalized.  Pt is taken by son and daughter via wheelchair to the lobby,

## 2014-07-02 NOTE — Progress Notes (Signed)
Pt son arrived at bedside inquiring about pt status, updated son with pt current status and pt discharge orders, son stated he was waiting for his sister who was on her way. Son began gathering his father belongings including pt personal wound vac, taking pictures of hospital equipments, IV pumps , IV fluids that was d/c, disconnected equipments along with pt surroundings.

## 2014-07-07 DIAGNOSIS — L89109 Pressure ulcer of unspecified part of back, unspecified stage: Secondary | ICD-10-CM | POA: Diagnosis present

## 2014-07-07 DIAGNOSIS — L8993 Pressure ulcer of unspecified site, stage 3: Secondary | ICD-10-CM | POA: Diagnosis not present

## 2014-07-20 ENCOUNTER — Encounter: Payer: Self-pay | Admitting: Internal Medicine

## 2014-07-20 ENCOUNTER — Ambulatory Visit (INDEPENDENT_AMBULATORY_CARE_PROVIDER_SITE_OTHER): Payer: Medicare HMO | Admitting: Internal Medicine

## 2014-07-20 VITALS — BP 180/80 | HR 64 | Temp 97.5°F | Resp 18

## 2014-07-20 DIAGNOSIS — S32000S Wedge compression fracture of unspecified lumbar vertebra, sequela: Secondary | ICD-10-CM

## 2014-07-20 DIAGNOSIS — R627 Adult failure to thrive: Secondary | ICD-10-CM

## 2014-07-20 DIAGNOSIS — L89150 Pressure ulcer of sacral region, unstageable: Secondary | ICD-10-CM

## 2014-07-20 DIAGNOSIS — K5900 Constipation, unspecified: Secondary | ICD-10-CM

## 2014-07-20 DIAGNOSIS — IMO0002 Reserved for concepts with insufficient information to code with codable children: Secondary | ICD-10-CM

## 2014-07-20 DIAGNOSIS — R1310 Dysphagia, unspecified: Secondary | ICD-10-CM

## 2014-07-20 DIAGNOSIS — Z23 Encounter for immunization: Secondary | ICD-10-CM

## 2014-07-20 DIAGNOSIS — L89109 Pressure ulcer of unspecified part of back, unspecified stage: Secondary | ICD-10-CM

## 2014-07-20 DIAGNOSIS — G608 Other hereditary and idiopathic neuropathies: Secondary | ICD-10-CM

## 2014-07-20 DIAGNOSIS — L8995 Pressure ulcer of unspecified site, unstageable: Secondary | ICD-10-CM

## 2014-07-20 DIAGNOSIS — G231 Progressive supranuclear ophthalmoplegia [Steele-Richardson-Olszewski]: Secondary | ICD-10-CM

## 2014-07-20 DIAGNOSIS — E43 Unspecified severe protein-calorie malnutrition: Secondary | ICD-10-CM

## 2014-07-20 DIAGNOSIS — K5909 Other constipation: Secondary | ICD-10-CM

## 2014-07-20 NOTE — Progress Notes (Signed)
Patient ID: Ricky [name], male   DOB: November 15, 1917, 80 y.o.   MRN: [id number]   Location:  [facility]  Code Status: DNR  Allergies  Allergen Reactions  . Other Other (See Comments)    Dairy products- cause swallowing difficulty     Chief Complaint  Patient presents with  . Hospitalization Follow-up    HPI: Patient is a 80 y.o. 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  His code status has now changed to DNR during his last few stays.  He now has a wound vac on his pressure ulcer.  His blood pressure is elevated due to back pain as his family states that his lidoderm patch was the cause of the pressure ulcer worsening.    Had 5 great days in a row, then didn't eat and drink much today.  Is drowsy.  His hospitalizations and equipment coverage has been difficult lately.    Since 5/15, extended periods of poor po intake.  5/27, was dehydrated-was rehydrated and got temp down, sent back home.  Back and forth between w/o and w/ intake.  No major infections between even w/ little urine output.  Went until 8/12 w/o hospitalization.  Had rectal bleeding from mobic for his back pain that was worsening and lidoderm had not been working.  Now on voltaren gel 4x daily--helps some, but does not knock out pain.  When lethargic, it's hard to tell if he's having pain.  Went to Dr. Benson Norway to discuss feeding tube--advised he would not tolerate this procedure well at his age.  Just playing it by ear.  His son feeds him when he does wake up and get alert.  Rehydrates him at night and moves him due to wound vac.    Turning on side helps also and special roho cushion.  Improved 1/2dollar to dime until hospitalized and on back.  Moved but put back on back per son.  Wound vac put on--didn't help at first.  Put back on medical modalities version--progress made until 8/19 after he  got home.  Still has tunneling under skin.  Seeing wound care center every other week.  Two fridays ago, felt outside would heal before inside so silver alginate added.  Doing since and not working quickly per his son.    Losing teeth 12/14 contributed to weight loss, hospitalizations caused weight loss and now his off and on eating.  Son estimates 35 lb weight loss since sept.  He's unable to stand to weight here.  Swallowing is and up and down thing also.  Had been stable until about June with his swallowing.  More slurred speech, pocketing food, drooling, less strength to suck fluid up straw. Had a period when saw GI where it was worse than now.      His son has been adding things to keep his bowels moving.  ER doctor thought that might be too much, but GI felt this was needed.  Is regular with his bowel regimen.  Urine has looked good.  A couple of clots here and there then clear up.  Grabs and holds onto things as they go by and he won't let go.    Review of Systems:  Review of Systems  Constitutional: Positive for malaise/fatigue. Negative for fever.  HENT: Negative for congestion.   Eyes: Negative for blurred vision.  Respiratory: Negative for shortness of breath.  Cardiovascular: Negative for chest pain and leg swelling.  Gastrointestinal: Positive for constipation. Negative for abdominal pain, blood in stool and melena.       None since his hospitalization  Genitourinary: Positive for hematuria. Negative for dysuria, urgency and frequency.       Foley in place  Musculoskeletal: Negative for falls.  Neurological: Positive for weakness. Negative for dizziness and headaches.  Endo/Heme/Allergies: Does not bruise/bleed easily.  Psychiatric/Behavioral: Positive for memory loss.    Past Medical History  Diagnosis Date  . IBS (irritable bowel syndrome)   . Diverticulosis   . Cataract   . OAB (overactive bladder)   . Macular degeneration   . Alzheimer disease   . Hypertension     . Arthritis   . Osteoporosis   . Parkinsonian syndrome   . Syncope, vasovagal   . Neuromuscular disorder     parkisonian syndrome  . 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 Unspecified transient cerebral ischemia   . Unspecified urinary incontinence   . Senile dementia, uncomplicated   . Unspecified constipation   . Hypothyroidism   . Anemia, unspecified   . Depression   . Alzheimer's disease   . Gout, unspecified   . Thoracic or lumbosacral neuritis or radiculitis, unspecified   . Progressive supranuclear palsy   . Unspecified transient cerebral ischemia   . Unspecified urinary incontinence   . Syncope and collapse   . Paralysis agitans   . Unspecified transient cerebral ischemia   . Pneumonitis due to inhalation of food or vomitus   . Cellulitis and abscess of oral soft tissues   . Screening for lipoid disorders   . Senile dementia, uncomplicated   . Unspecified constipation   . Disorder of bone and cartilage, unspecified   . Other specified acquired hypothyroidism   . Unspecified hypothyroidism   . Anemia, unspecified   . Depressive disorder, not elsewhere classified   . Alzheimer's disease   . Paralysis agitans   . Pneumonia, organism unspecified   . Pseudomonas infection in conditions classified elsewhere and of unspecified site   . Unspecified essential hypertension   . Urinary tract infection, site not specified   . Osteoarthrosis, unspecified whether generalized or localized, unspecified site   . Osteoporosis, unspecified   . Gout, unspecified   . Thoracic or lumbosacral neuritis or radiculitis, unspecified   . Stroke     Past Surgical History  Procedure Laterality Date  . Esophagogastroduodenoscopy    . Eye surgery  2006    LEFT CATARACT    Social History:   reports that he has quit smoking. He has never used smokeless tobacco. He reports that he does not drink alcohol or use illicit drugs.  Family History  Problem  Relation Age of Onset  . Cancer Mother   . Stroke Father   . Liver disease Father   . Kidney disease Brother   . Liver disease Brother   . Emphysema Brother     Medications: Patient's Medications  New Prescriptions   No medications on file  Previous Medications   ACETAMINOPHEN (TYLENOL) 500 MG TABLET    Take 1,000 mg by mouth 3 (three) times daily.    ASPIRIN EC 325 MG TABLET    Take 325 mg by mouth every evening.   BACITRACIN OINTMENT    Apply 1 application topically 2 (two) times daily. Applies to head of penis to decrease infection from catheter.   CALCIUM CARBONATE-VIT D-MIN (CALTRATE 600+D PLUS) 600-800 MG-UNIT  CHEW    Chew 1 tablet by mouth 2 (two) times daily with a meal.    CLOTRIMAZOLE-BETAMETHASONE (LOTRISONE) CREAM    Apply 1 application topically 2 (two) times daily as needed (Applies to body for rash.).    CRANBERRY SOFT PO    Take 1 capsule by mouth 3 (three) times daily.   CYANOCOBALAMIN (VITAMIN B-12) 5000 MCG SUBL    Place 5,000 mcg under the tongue 2 (two) times daily.   D-MANNOSE POWD    Take 2,000 mg by mouth 2 (two) times daily.   FERROUS SULFATE 325 (65 FE) MG TABLET    Take 325 mg by mouth every other day.    GUAIFENESIN (MUCINEX) 600 MG 12 HR TABLET    Take 600 mg by mouth 2 (two) times daily as needed for cough.   HYDRALAZINE (APRESOLINE) 25 MG TABLET    Take 12.5 mg by mouth 2 (two) times daily.   LACTOSE FREE NUTRITION (BOOST) LIQD    Take 90 mLs by mouth 3 (three) times daily between meals.   LEVOTHYROXINE (SYNTHROID, LEVOTHROID) 25 MCG TABLET    Take 25 mcg by mouth daily before breakfast.   MELATONIN 5 MG TABS    Take 5 mg by mouth at bedtime as needed (For sleep.).   MEMANTINE (NAMENDA) 10 MG TABLET    Take 10 mg by mouth 2 (two) times daily.   MULTIPLE VITAMIN (MULTIVITAMIN WITH MINERALS) TABS    Take 1 tablet by mouth daily.   MULTIPLE VITAMINS-MINERALS (ICAPS AREDS FORMULA PO)    Take 2 capsules by mouth 2 (two) times daily with a meal.    POLYETHYLENE  GLYCOL (MIRALAX / GLYCOLAX) PACKET    Take 17 g by mouth daily as needed for mild constipation.   PROBIOTIC PRODUCT (PHILLIPS COLON HEALTH) CAPS    Take 1 capsule by mouth 2 (two) times daily.   VOLTAREN 1 % GEL    Apply topically 4 (four) times daily.  Modified Medications   No medications on file  Discontinued Medications   No medications on file     Physical Exam: Filed Vitals:   07/20/14 1306  BP: 180/80  Pulse: 64  Temp: 97.5 F (36.4 C)  TempSrc: Oral  Resp: 18  SpO2: 98%  Physical Exam  Constitutional:  Kyphotic black male in wheelchair  Cardiovascular: Normal rate, regular rhythm, normal heart sounds and intact distal pulses.   Pulmonary/Chest: Effort normal and breath sounds normal. No respiratory distress. He has no wheezes.  Abdominal: Soft. Bowel sounds are normal. He exhibits no distension and no mass. There is no tenderness.  Genitourinary:  Foley in place with clear yellow urine  Musculoskeletal:  Rigidity of arms and legs  Neurological:  arousable to voice; minimal now dysarthric speech  Skin:  Wound vac in place    Labs reviewed: Basic Metabolic Panel:  Recent Labs  03/08/14 2259  03/21/14 0312  04/13/14 1538 06/28/14 1210 06/29/14 0515  NA 138  < > 137  < > 138 139 135*  K 4.4  < > 3.5*  < > 4.0 5.1 4.0  CL 100  < > 99  < > 99 100 99  CO2 25  < > 25  < > 22 26 23   GLUCOSE 91  < > 107*  < > 86 115* 101*  BUN 11  < > 9  < > 14 18 15   CREATININE 0.82  < > 0.80  < > 0.84 1.05 0.77  CALCIUM 9.2  < >  8.9  < > 9.2 9.7 8.7  TSH 1.490  --  0.804  --   --  2.240  --   < > = values in this interval not displayed. Liver Function Tests:  Recent Labs  03/31/14 1900 04/12/14 0915 06/28/14 1210  AST 21 35 36  ALT 15 25 19   ALKPHOS 96 89 82  BILITOT 0.4 0.5 0.6  PROT 8.1 8.2 8.2  ALBUMIN 3.5 3.7 3.5   No results found for this basename: LIPASE, AMYLASE,  in the last 8760 hours No results found for this basename: AMMONIA,  in the last 8760  hours CBC:  Recent Labs  04/12/14 0915 04/13/14 1538 06/28/14 1132 06/29/14 0515 06/30/14 0455 07/01/14 0542  WBC 12.9* 8.3 8.4 6.5 5.9 5.4  NEUTROABS 11.2* 5.2 6.6  --   --   --   HGB 11.4* 10.5* 11.9* 10.5* 10.9* 11.1*  HCT 35.9* 32.9* 37.1* 31.6* 34.4* 34.7*  MCV 87.8 86 87.3 84.9 87.3 86.8  PLT 213 235 197 192 168 200   Lipid Panel: No results found for this basename: CHOL, HDL, LDLCALC, TRIG, CHOLHDL, LDLDIRECT,  in the last 8760 hours Lab Results  Component Value Date   HGBA1C 5.3 06/21/2012   Assessment/Plan 1. Supranuclear palsy -progressive -has had further decline in po intake and fewer good days -most of his episodes of decline are related to this  2. Constipation, chronic -cont bowel regimen as per GI  3. FTT (failure to thrive) in adult -due to #1, family is very supportive and feeding him when he's awake enough  4. Protein-calorie malnutrition, severe -due to #1 and 3  5. Dysphagia -due to #1 -no PEG tube -aspiration precautions  6. Decubitus ulcer of sacral region, unstageable -cont hospital bed with air mattress -regular turning -cushion on wheelchair  7. Lumbar compression fracture - continue voltaren gel -keep orthopedics f/u   8.  Need for influenza -vaccine given today  Labs/tests ordered:  Has had all labs needed in the hospital  Next appt:  3 mos and prn

## 2014-07-21 ENCOUNTER — Encounter (HOSPITAL_BASED_OUTPATIENT_CLINIC_OR_DEPARTMENT_OTHER): Payer: Medicare HMO | Attending: General Surgery

## 2014-07-21 DIAGNOSIS — L8993 Pressure ulcer of unspecified site, stage 3: Secondary | ICD-10-CM | POA: Insufficient documentation

## 2014-07-21 DIAGNOSIS — L89109 Pressure ulcer of unspecified part of back, unspecified stage: Secondary | ICD-10-CM | POA: Insufficient documentation

## 2014-07-25 DIAGNOSIS — L89109 Pressure ulcer of unspecified part of back, unspecified stage: Secondary | ICD-10-CM | POA: Diagnosis present

## 2014-07-25 DIAGNOSIS — L8993 Pressure ulcer of unspecified site, stage 3: Secondary | ICD-10-CM | POA: Diagnosis not present

## 2014-08-02 DIAGNOSIS — L89109 Pressure ulcer of unspecified part of back, unspecified stage: Secondary | ICD-10-CM

## 2014-08-02 DIAGNOSIS — Z466 Encounter for fitting and adjustment of urinary device: Secondary | ICD-10-CM

## 2014-08-02 DIAGNOSIS — R339 Retention of urine, unspecified: Secondary | ICD-10-CM

## 2014-08-02 DIAGNOSIS — L8994 Pressure ulcer of unspecified site, stage 4: Secondary | ICD-10-CM

## 2014-08-04 DIAGNOSIS — L89109 Pressure ulcer of unspecified part of back, unspecified stage: Secondary | ICD-10-CM | POA: Diagnosis not present

## 2014-08-04 DIAGNOSIS — L8993 Pressure ulcer of unspecified site, stage 3: Secondary | ICD-10-CM | POA: Diagnosis not present

## 2014-08-11 ENCOUNTER — Telehealth: Payer: Self-pay | Admitting: *Deleted

## 2014-08-11 NOTE — Telephone Encounter (Signed)
Patient's son called to report increased need for extra 1/2 (12.5 mg) of hydralazine over last two weeks. Was told in past ok to give an extra 1/2 if SBP is >175. Today they checked his BP very frequently in same arm.  It went as high as 218/109. After extra dose of medication BP is now 145/88.  Apparently elevated BP comes when patient becomes very agitated and restless. Son mentions that patient has a coccyx wound with a wound vac that was changed today, and a compression fx in his back.  Discussed his agitation and pain as possible contributing factors to his BP elevations. He will continue to give extra 1/2 dose of hydralazine as needed for SBP >175. Advised him to report to PCP for further instruction/advice.

## 2014-08-18 ENCOUNTER — Encounter (HOSPITAL_BASED_OUTPATIENT_CLINIC_OR_DEPARTMENT_OTHER): Payer: Medicare HMO | Attending: General Surgery

## 2014-08-18 DIAGNOSIS — L89152 Pressure ulcer of sacral region, stage 2: Secondary | ICD-10-CM | POA: Insufficient documentation

## 2014-08-18 DIAGNOSIS — L89153 Pressure ulcer of sacral region, stage 3: Secondary | ICD-10-CM | POA: Insufficient documentation

## 2014-08-29 ENCOUNTER — Telehealth: Payer: Self-pay | Admitting: *Deleted

## 2014-08-29 MED ORDER — LIDOCAINE 5 % EX PTCH: 3.0000 | MEDICATED_PATCH | CUTANEOUS | Status: DC

## 2014-08-29 NOTE — Telephone Encounter (Signed)
[name], son called and stated that his dad needs a Rx for Lidocaine  Patch. Stated that you gave it to them over a year ago and it works the best. Stated that he needs it for 3patches a day.He stated that the insert in the package stated that he could use 3 patches and they have. Please Advise.

## 2014-08-29 NOTE — Telephone Encounter (Signed)
This is fine.  I doubt his insurance will cover it; however.  We can try.  Dispense 90 with 3 refills.

## 2014-08-29 NOTE — Telephone Encounter (Signed)
[name], son Notified and faxed to [facility].

## 2014-09-01 DIAGNOSIS — L89152 Pressure ulcer of sacral region, stage 2: Secondary | ICD-10-CM | POA: Diagnosis not present

## 2014-09-01 DIAGNOSIS — L89153 Pressure ulcer of sacral region, stage 3: Secondary | ICD-10-CM | POA: Diagnosis not present

## 2014-09-15 DIAGNOSIS — L89153 Pressure ulcer of sacral region, stage 3: Secondary | ICD-10-CM | POA: Diagnosis not present

## 2014-09-15 DIAGNOSIS — L89152 Pressure ulcer of sacral region, stage 2: Secondary | ICD-10-CM | POA: Diagnosis not present

## 2014-09-29 ENCOUNTER — Encounter (HOSPITAL_BASED_OUTPATIENT_CLINIC_OR_DEPARTMENT_OTHER): Payer: Medicare HMO | Attending: General Surgery

## 2014-09-29 DIAGNOSIS — L89152 Pressure ulcer of sacral region, stage 2: Secondary | ICD-10-CM | POA: Insufficient documentation

## 2014-10-01 DIAGNOSIS — G20 Parkinson's disease: Secondary | ICD-10-CM

## 2014-10-01 DIAGNOSIS — R339 Retention of urine, unspecified: Secondary | ICD-10-CM

## 2014-10-01 DIAGNOSIS — L89154 Pressure ulcer of sacral region, stage 4: Secondary | ICD-10-CM

## 2014-10-01 DIAGNOSIS — Z466 Encounter for fitting and adjustment of urinary device: Secondary | ICD-10-CM

## 2014-10-04 ENCOUNTER — Telehealth: Payer: Self-pay | Admitting: Neurology

## 2014-10-04 NOTE — Telephone Encounter (Signed)
Spoke with caregiver to reschedule appt to different time same day(11/22/14)

## 2014-10-11 DIAGNOSIS — L89152 Pressure ulcer of sacral region, stage 2: Secondary | ICD-10-CM | POA: Diagnosis not present

## 2014-10-19 ENCOUNTER — Other Ambulatory Visit: Payer: Self-pay | Admitting: Internal Medicine

## 2014-10-19 ENCOUNTER — Ambulatory Visit (INDEPENDENT_AMBULATORY_CARE_PROVIDER_SITE_OTHER): Payer: Medicare HMO | Admitting: Internal Medicine

## 2014-10-19 ENCOUNTER — Encounter: Payer: Self-pay | Admitting: Internal Medicine

## 2014-10-19 VITALS — BP 160/88 | HR 99 | Temp 97.4°F | Resp 10

## 2014-10-19 DIAGNOSIS — G608 Other hereditary and idiopathic neuropathies: Secondary | ICD-10-CM

## 2014-10-19 DIAGNOSIS — R627 Adult failure to thrive: Secondary | ICD-10-CM

## 2014-10-19 DIAGNOSIS — K5909 Other constipation: Secondary | ICD-10-CM

## 2014-10-19 DIAGNOSIS — R1310 Dysphagia, unspecified: Secondary | ICD-10-CM

## 2014-10-19 DIAGNOSIS — G231 Progressive supranuclear ophthalmoplegia [Steele-Richardson-Olszewski]: Secondary | ICD-10-CM

## 2014-10-19 DIAGNOSIS — L89150 Pressure ulcer of sacral region, unstageable: Secondary | ICD-10-CM

## 2014-10-19 DIAGNOSIS — K5900 Constipation, unspecified: Secondary | ICD-10-CM

## 2014-10-19 DIAGNOSIS — E43 Unspecified severe protein-calorie malnutrition: Secondary | ICD-10-CM

## 2014-10-19 NOTE — Progress Notes (Signed)
Patient ID: Ricky [name], male   DOB: [birth year], 80 y.o.   MRN: [id number]   Location:  [facility]  Code Status: DNR  Allergies  Allergen Reactions  . Other Other (See Comments)    Dairy products- cause swallowing difficulty     Chief Complaint  Patient presents with  . Medical Management of Chronic Issues    3 month follow-up on swallowing, alertness, and   . Abnormal Urine    Cloudy urine     HPI: Patient is a 80 y.o.  seen in the office today for medical mgt of chronic diseases especially his progressive supranuclear palsy.    Low back pain: mobic--internal bleeding in august voltaren helped but did not resolve the issue lidoderm patches--using three helped him Sacral wound healed and wound vac no longer needed Had potential improvement of lumbar compression fx also  Continues to have difficulty with not eating or drinking for several days.  GI had previously refused to place a PEG.  His son has been feeding him or helping him drink when he accepts it.  Weight goes up and down.    Urine was ridiculously cloudy 10 days ago, but cleared up.   Putting out 1500 cc urine per night.    Decent ambulation and alertness recently.    12/15 will be 4 mos out of the hospital.      With his PSP--curling fingers is biggest issue--he cannot grab onto grab bars or let go.  Does not use both hands to sit down.  Balance unchanged.  No changes with his gaze.  More kyphosis.  Continues to have dysphagia.  Has not significantly progressed from May.  Can eat mechanical soft.   Bowels have been very consistent lately--normal movements.     Has stopped atorvastatin.  BPs are all over at times.  Gets extra 1/2 tab of hydralazine if bp over 443 systolic per cardiology.  Getting more and more high readings September onward.  Last week especially bad.  They decided not to micromanage the bp.      Review of Systems:  Review of Systems  Unable to  perform ROS: dementia    Past Medical History  Diagnosis Date  . IBS (irritable bowel syndrome)   . Diverticulosis   . Cataract   . OAB (overactive bladder)   . Macular degeneration   . Alzheimer disease   . Hypertension   . Arthritis   . Osteoporosis   . Parkinsonian syndrome   . Syncope, vasovagal   . Neuromuscular disorder     parkisonian syndrome  . 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 Unspecified transient cerebral ischemia   . Unspecified urinary incontinence   . Senile dementia, uncomplicated   . Unspecified constipation   . Hypothyroidism   . Anemia, unspecified   . Depression   . Alzheimer's disease   . Gout, unspecified   . Thoracic or lumbosacral neuritis or radiculitis, unspecified   . Progressive supranuclear palsy   . Unspecified transient cerebral ischemia   . Unspecified urinary incontinence   . Syncope and collapse   . Paralysis agitans   . Unspecified transient cerebral ischemia   . Pneumonitis due to inhalation of food or vomitus   . Cellulitis and abscess of oral soft tissues   . Screening for lipoid disorders   . Senile dementia, uncomplicated   . Unspecified constipation   . Disorder of bone and cartilage, unspecified   .  Other specified acquired hypothyroidism   . Unspecified hypothyroidism   . Anemia, unspecified   . Depressive disorder, not elsewhere classified   . Alzheimer's disease   . Paralysis agitans   . Pneumonia, organism unspecified   . Pseudomonas infection in conditions classified elsewhere and of unspecified site   . Unspecified essential hypertension   . Urinary tract infection, site not specified   . Osteoarthrosis, unspecified whether generalized or localized, unspecified site   . Osteoporosis, unspecified   . Gout, unspecified   . Thoracic or lumbosacral neuritis or radiculitis, unspecified   . Stroke     Past Surgical History  Procedure Laterality Date  . Esophagogastroduodenoscopy     . Eye surgery  2006    LEFT CATARACT    Social History:   reports that he has quit smoking. He has never used smokeless tobacco. He reports that he does not drink alcohol or use illicit drugs.  Family History  Problem Relation Age of Onset  . Cancer Mother   . Stroke Father   . Liver disease Father   . Kidney disease Brother   . Liver disease Brother   . Emphysema Brother     Medications: Patient's Medications  New Prescriptions   No medications on file  Previous Medications   ACETAMINOPHEN (TYLENOL) 500 MG TABLET    Take 1,000 mg by mouth 3 (three) times daily.    ASPIRIN EC 325 MG TABLET    Take 325 mg by mouth every evening.   BACITRACIN OINTMENT    Apply 1 application topically 2 (two) times daily. Applies to head of penis to decrease infection from catheter.   CALCIUM CARBONATE-VIT D-MIN (CALTRATE 600+D PLUS) 600-800 MG-UNIT CHEW    Chew 1 tablet by mouth 2 (two) times daily with a meal.    CLOTRIMAZOLE-BETAMETHASONE (LOTRISONE) CREAM    Apply 1 application topically 2 (two) times daily as needed (Applies to body for rash.).    CRANBERRY SOFT PO    Take 2 capsules by mouth 3 (three) times daily.    CYANOCOBALAMIN (VITAMIN B-12) 5000 MCG SUBL    Place 5,000 mcg under the tongue 2 (two) times daily.   D-MANNOSE POWD    Take 2,000 mg by mouth 2 (two) times daily.   FERROUS SULFATE 325 (65 FE) MG TABLET    Take 325 mg by mouth every other day.    GUAIFENESIN (MUCINEX) 600 MG 12 HR TABLET    Take 600 mg by mouth 2 (two) times daily as needed for cough.   HYDRALAZINE (APRESOLINE) 25 MG TABLET    Take 12.5 mg by mouth 2 (two) times daily.   LACTOSE FREE NUTRITION (BOOST) LIQD    Take 90 mLs by mouth 3 (three) times daily between meals.   LEVOTHYROXINE (SYNTHROID, LEVOTHROID) 25 MCG TABLET    TAKE 1 TABLET BY MOUTH DAILY   LIDOCAINE (LIDODERM) 5 %    Place 3 patches onto the skin daily. Remove & Discard patch within 12 hours or as directed by MD   MELATONIN 5 MG TABS    Take 5 mg  by mouth at bedtime as needed (For sleep.).   MEMANTINE (NAMENDA) 10 MG TABLET    Take 10 mg by mouth 2 (two) times daily.   MULTIPLE VITAMINS-MINERALS (ICAPS AREDS FORMULA PO)    Take 2 capsules by mouth 2 (two) times daily with a meal.    POLYETHYLENE GLYCOL (MIRALAX / GLYCOLAX) PACKET    Take 17 g by mouth daily  as needed for mild constipation.   PROBIOTIC PRODUCT (PHILLIPS COLON HEALTH) CAPS    Take 1 capsule by mouth 2 (two) times daily.   PSYLLIUM (METAMUCIL) 58.6 % PACKET    2 tablespoons two times daily   VOLTAREN 1 % GEL    Apply topically 4 (four) times daily.  Modified Medications   No medications on file  Discontinued Medications   LEVOTHYROXINE (SYNTHROID, LEVOTHROID) 25 MCG TABLET    Take 25 mcg by mouth daily before breakfast.   MULTIPLE VITAMIN (MULTIVITAMIN WITH MINERALS) TABS    Take 1 tablet by mouth daily.   Physical Exam: Filed Vitals:   10/19/14 1313  BP: 160/88  Pulse: 99  Temp: 97.4 F (36.3 C)  TempSrc: Oral  Resp: 10  SpO2: 99%  Physical Exam  Constitutional:  Frail black male seated in wheelchair  Cardiovascular: Normal rate, regular rhythm, normal heart sounds and intact distal pulses.   Pulmonary/Chest: Effort normal and breath sounds normal. No respiratory distress.  Abdominal: Soft. Bowel sounds are normal. He exhibits no distension and no mass. There is no tenderness.  Genitourinary:  Catheter in place with light yellow slightly cloudy urine  Musculoskeletal:  Contracture of left hand especially last two fingers, kyphotic posture, flat affect  Skin: Skin is warm and dry.  Healed pressure ulcer    Labs reviewed: Basic Metabolic Panel:  Recent Labs  03/08/14 2259  03/21/14 0312  04/13/14 1538 06/28/14 1210 06/29/14 0515  NA 138  < > 137  < > 138 139 135*  K 4.4  < > 3.5*  < > 4.0 5.1 4.0  CL 100  < > 99  < > 99 100 99  CO2 25  < > 25  < > 22 26 23   GLUCOSE 91  < > 107*  < > 86 115* 101*  BUN 11  < > 9  < > 14 18 15   CREATININE 0.82  <  > 0.80  < > 0.84 1.05 0.77  CALCIUM 9.2  < > 8.9  < > 9.2 9.7 8.7  TSH 1.490  --  0.804  --   --  2.240  --   < > = values in this interval not displayed. Liver Function Tests:  Recent Labs  03/31/14 1900 04/12/14 0915 06/28/14 1210  AST 21 35 36  ALT 15 25 19   ALKPHOS 96 89 82  BILITOT 0.4 0.5 0.6  PROT 8.1 8.2 8.2  ALBUMIN 3.5 3.7 3.5   No results for input(s): LIPASE, AMYLASE in the last 8760 hours. No results for input(s): AMMONIA in the last 8760 hours. CBC:  Recent Labs  04/12/14 0915 04/13/14 1538 06/28/14 1132 06/29/14 0515 06/30/14 0455 07/01/14 0542  WBC 12.9* 8.3 8.4 6.5 5.9 5.4  NEUTROABS 11.2* 5.2 6.6  --   --   --   HGB 11.4* 10.5* 11.9* 10.5* 10.9* 11.1*  HCT 35.9* 32.9* 37.1* 31.6* 34.4* 34.7*  MCV 87.8 86 87.3 84.9 87.3 86.8  PLT 213 235 197 192 168 200   Lab Results  Component Value Date   HGBA1C 5.3 06/21/2012   Assessment/Plan 1. Progressive supranuclear palsy -has done much better recently and has gone several months w/o a hospitalization -seems his family is more agreeable to a more watchful waiting approach  -his code status has been changed to DNR  -the patient himself does not want to return to the hospital 2. Constipation, chronic -intermittently--has been better in recent past also 3. Decubitus ulcer of  sacral region, unstageable -resolved, no longer has to attend wound care center as of last week 4. FTT (failure to thrive) in adult -goes a few days at times without eating 5. Protein-calorie malnutrition, severe -continue lactose free boost and mechanical soft diet 6. Dysphagia -mechanical soft diet generally tolerated per her son -pills are crushed  Labs/tests ordered:  None, taking a palliative approach Next appt:  3 mos  Andreyah Natividad L. Alassane Kalafut, D.O. [facility] [address], [place] [address] Cell Phone (Mon-Fri 8am-5pm):  [phone] On Call:  [phone] & follow prompts  after 5pm & weekends Office Phone:  [phone] Office Fax:  [phone]

## 2014-10-30 ENCOUNTER — Other Ambulatory Visit: Payer: Self-pay | Admitting: *Deleted

## 2014-10-30 MED ORDER — AMBULATORY NON FORMULARY MEDICATION: Status: AC

## 2014-10-30 NOTE — Telephone Encounter (Signed)
Received fax from [name], Caregiver Transport planner [phone]) [employer] to be faxed to [employer] [facility] # [phone] Fax #: [phone]. Printed and given to Dr. Mariea Clonts to review and sign.

## 2014-11-14 ENCOUNTER — Other Ambulatory Visit: Payer: Self-pay | Admitting: *Deleted

## 2014-11-14 MED ORDER — HYDRALAZINE HCL 25 MG PO TABS: ORAL_TABLET | ORAL | Status: AC

## 2014-11-14 NOTE — Telephone Encounter (Signed)
[facility] [address]

## 2014-11-22 ENCOUNTER — Encounter: Payer: Self-pay | Admitting: Neurology

## 2014-11-22 ENCOUNTER — Ambulatory Visit (INDEPENDENT_AMBULATORY_CARE_PROVIDER_SITE_OTHER): Payer: 59 | Admitting: Neurology

## 2014-11-22 VITALS — BP 152/79 | HR 57 | Temp 97.7°F | Ht 70.0 in | Wt 115.0 lb

## 2014-11-22 DIAGNOSIS — Z960 Presence of urogenital implants: Secondary | ICD-10-CM

## 2014-11-22 DIAGNOSIS — N390 Urinary tract infection, site not specified: Secondary | ICD-10-CM

## 2014-11-22 DIAGNOSIS — Z978 Presence of other specified devices: Secondary | ICD-10-CM

## 2014-11-22 DIAGNOSIS — R5381 Other malaise: Secondary | ICD-10-CM

## 2014-11-22 DIAGNOSIS — R54 Age-related physical debility: Secondary | ICD-10-CM

## 2014-11-22 DIAGNOSIS — Z9889 Other specified postprocedural states: Secondary | ICD-10-CM

## 2014-11-22 DIAGNOSIS — G231 Progressive supranuclear ophthalmoplegia [Steele-Richardson-Olszewski]: Secondary | ICD-10-CM

## 2014-11-22 DIAGNOSIS — R293 Abnormal posture: Secondary | ICD-10-CM

## 2014-11-22 DIAGNOSIS — R413 Other amnesia: Secondary | ICD-10-CM

## 2014-11-22 NOTE — Progress Notes (Signed)
Subjective:    Patient ID: Ricky [name] is a 81 y.o. male.  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 He is accompanied by his son, Ricky [name], and his daughter, Ricky [name] today. I last saw him on 05/26/2014, at which time his son reported that the patient had been accepted to [facility]. They did not pursue a G-tube. He had developed a decubitus ulcer on his tailbone which has since then healed. Ricky [name] is his primary care physician and he just saw her on 10/19/14.  Today, his son and daughter report that he has been doing reasonably well. In fact, looking back at the last year this is the best he has done probably a whole year. He has been out of the hospital for about 4 months now. The last admission was secondary to a GI bleed which was likely secondary to using Mobic. Voltaren helped a little. He is now using lidocaine patches for back pain. He is eating by mouth and does not seem to choke. His appetite is good. His mobility is no difference. He is wheelchair-bound but can help with brief transfers and pivoting. His son exercises with him every day. His posture has become worse and he has a significant neck forward tilt. They would like to pursue palliative care at this time. They are not treating him heroically. He is off of a lot of his medications. Thankfully he has not had any fevers lately. He has not had a urinary tract infection months. He is more awake during the day and sleeps fairly well at night. There are no significant hallucinations. He seems to track better with his eyes.    I saw him on 01/04/2014, at which time I felt he had advanced PSP. I agreed that we could try him on neupro patch again at the request of his  son. I continued him on Namenda but I was sure to explain to the patient and his son that given his advanced age and the atypical parkinsonism he would be more at risk for side effects. I suggested initiating physical therapy outpatient again. I felt he needed 24-7 supervision.  He was admitted to the hospital several times: On 01/15/14 he was at [facility] for syncope d/t perhaps constipation. He was D/C on 01/17/14, he was admitted on 03/08/14 for fall with abrasion to forehead, was treated for UTI and URI, which was found at the time, D/C on 03/13/14, he was admitted to [facility] on 03/20/14, d/t UTI and aspiration pneumonia and D/C on 03/28/14 and went to [facility] for 3 days, but admission was denied by his insurance and then went to [facility] ER from there, not admitted and again went to the [facility] ER on 04/12/14 for not eating and drinking well, was dehydrated and D/C to home.   I saw him on 07/02/2013, at which time I did not start him on any medications. He had tried Sinemet, Neupro and Aricept in the past. I continued him on Namenda.   He was seen in the emergency room several times. On 07/12/2013 he presented to the ER after a syncopal spell. Workup included EKG and urinalysis. He did not need further admission or treatment. On 09/09/2013 he presented to the emergency room not feeling well and complaining of  back pain. An x-ray of the lumbar spine showed an L1 anterior wedge compression fracture of unknown age. He did not need further treatment or admission. On 10/18/2013 he was admitted to the hospital for one day for chest pain. Workup showed no acute coronary event, but he was felt to have atypical chest pain from GERD. They started protonix, he saw cardiology afterwards. The protonix was stopped after about 6 weeks. He was taken back to the ER on 10/20/14 d/t hematuria and eventually this stopped after changing the foley. He also tested positive for C. Diff and treated with Flagyl, which did not help and on 10/31/13 he was  placed on PO Vanc pills from 15th to 18th, then liquid for another 8 days, by Ricky [name], his cardiologist. He has had C. Diff 2 times before, per Ricky [name].   Today, Ricky [name] (who provides the entire Hx) reports, that patient had no been "right" since 9/14 off and on. He had elevated temperature off and on, up to 99.1, which is up from his baseline of around 97.1. In the last few weeks, he has been better, more closer to his baseline. Ricky [name] walks him about 7 times a day with maximum assistance by holding both is hands. In 1/13, he was using a walker and was supposed to have PT, but was not able to follow commands at the time and in hindsight, he had a smoldering infection. He has overall become stiffer and slower. He is on cranberry juice and tablets, and D-Mannose prn for UTI. He seems to have small incidences with aspiration. He has lately been eating better. He had some teeth pulled. His liquids are thickened and his food is finely chopped. There is a nurse that comes in every day around 8:15.   Looking back, Ricky [name] reports that when patient was on Neupro patch he was a little bit better. Ricky [name] did not notice any side effects.   I first met him on 04/01/2013 at which time I felt that the constellation of parkinsonism, memory loss, gait dysfunction, balance issues, recurrent falls and syncopal spells as well as a history of recurrent urinary tract infections most likely pointed towards a diagnosis of PSP (progressive supranuclear palsy). He previously followed with Ricky [name] and was last seen by him on 12/03/2012. He has a history of blackout spells for the past 5 years as well as memory loss. The first episode of loss of consciousness was noted in March 2010 at which time his son found him down on the floor rigid with his head flexed and his right hand trembling. He was admitted to the hospital for 3 days and diagnosed with vasovagal syncope and had workup including chest x-ray (cardiomegaly), head CT without  contrast (chronic small vessel disease), MRI brain without contrast (no acute abnormality and chronic small vessel disease), MRA head (normal), echocardiogram (EF of 60%). He had pneumonia, complicated by a PE in 1/82 and repeat MRI brain in 1/11 (generalized atrophy) and MRA head (mild stenosis of the L MCA). He was tried on Aricept (stopped d/t bradycardia) and Sinemet (discontinued). He has had recurrent pneumonia and recurrent UTIs and had a Foley catheter placed. He was at [facility] last in April 2013. He developed pressure ulcers on his feet. He has a CNA daily to help him bathe and dress and he has supervision 24-7. He was taken off of Neupro patch a hospitalization for UTI and his mental functioning improved.   He was admitted to the hospital with fever  and altered mental status on 06/08/2013 and discharged on 06/17/2013. He was treated for a urinary tract infection with IV vancomycin and had an LP with CSF cultures negative. He has become WC bound.    His Past Medical History Is Significant For: Past Medical History  Diagnosis Date  . IBS (irritable bowel syndrome)   . Diverticulosis   . Cataract   . OAB (overactive bladder)   . Macular degeneration   . Alzheimer disease   . Hypertension   . Arthritis   . Osteoporosis   . Parkinsonian syndrome   . Syncope, vasovagal   . Neuromuscular disorder     parkisonian syndrome  . 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 Unspecified transient cerebral ischemia   . Unspecified urinary incontinence   . Senile dementia, uncomplicated   . Unspecified constipation   . Hypothyroidism   . Anemia, unspecified   . Depression   . Alzheimer's disease   . Gout, unspecified   . Thoracic or lumbosacral neuritis or radiculitis, unspecified   . Progressive supranuclear palsy   . Unspecified transient cerebral ischemia   . Unspecified urinary incontinence   . Syncope and collapse   . Paralysis agitans   . Unspecified  transient cerebral ischemia   . Pneumonitis due to inhalation of food or vomitus   . Cellulitis and abscess of oral soft tissues   . Screening for lipoid disorders   . Senile dementia, uncomplicated   . Unspecified constipation   . Disorder of bone and cartilage, unspecified   . Other specified acquired hypothyroidism   . Unspecified hypothyroidism   . Anemia, unspecified   . Depressive disorder, not elsewhere classified   . Alzheimer's disease   . Paralysis agitans   . Pneumonia, organism unspecified   . Pseudomonas infection in conditions classified elsewhere and of unspecified site   . Unspecified essential hypertension   . Urinary tract infection, site not specified   . Osteoarthrosis, unspecified whether generalized or localized, unspecified site   . Osteoporosis, unspecified   . Gout, unspecified   . Thoracic or lumbosacral neuritis or radiculitis, unspecified   . Stroke     His Past Surgical History Is Significant For: Past Surgical History  Procedure Laterality Date  . Esophagogastroduodenoscopy    . Eye surgery  2006    LEFT CATARACT    His Family History Is Significant For: Family History  Problem Relation Age of Onset  . Cancer Mother   . Stroke Father   . Liver disease Father   . Kidney disease Brother   . Liver disease Brother   . Emphysema Brother     His Social History Is Significant For: History   Social History  . Marital Status: Single    Spouse Name: N/A    Number of Children: N/A  . Years of Education: N/A   Social History Main Topics  . Smoking status: Former Research scientist (life sciences)  . Smokeless tobacco: Never Used     Comment: QUIT SMOKING BACK IN THE LATE 50'S  . Alcohol Use: No  . Drug Use: No  . Sexual Activity: No   Other Topics Concern  . None   Social History Narrative    His Allergies Are:  Allergies  Allergen Reactions  . Other Other (See Comments)    Dairy products- cause swallowing difficulty   :   His Current Medications Are:   Outpatient Encounter Prescriptions as of 11/22/2014  Medication Sig  . acetaminophen (TYLENOL) 500  MG tablet Take 1,000 mg by mouth 3 (three) times daily.   . AMBULATORY NON FORMULARY MEDICATION Pressure Air Mattress Dx: G60.8, L89.150  . aspirin EC 325 MG tablet Take 325 mg by mouth every evening.  . bacitracin ointment Apply 1 application topically 2 (two) times daily. Applies to head of penis to decrease infection from catheter.  . Calcium Carbonate-Vit D-Min (CALTRATE 600+D PLUS) 600-800 MG-UNIT CHEW Chew 1 tablet by mouth 2 (two) times daily with a meal.   . clotrimazole-betamethasone (LOTRISONE) cream Apply 1 application topically 2 (two) times daily as needed (Applies to body for rash.).   Marland Kitchen CRANBERRY SOFT PO Take 2 capsules by mouth 3 (three) times daily.   . Cyanocobalamin (VITAMIN B-12) 5000 MCG SUBL Place 5,000 mcg under the tongue 2 (two) times daily.  . ferrous sulfate 325 (65 FE) MG tablet Take 325 mg by mouth every other day.   . hydrALAZINE (APRESOLINE) 25 MG tablet Take 1/2 tablet by mouth twice daily to control blood pressure  . lactose free nutrition (BOOST) LIQD Take 90 mLs by mouth 3 (three) times daily between meals.  Marland Kitchen levothyroxine (SYNTHROID, LEVOTHROID) 25 MCG tablet TAKE 1 TABLET BY MOUTH DAILY  . lidocaine (LIDODERM) 5 % Place 3 patches onto the skin daily. Remove & Discard patch within 12 hours or as directed by MD  . Melatonin 5 MG TABS Take 5 mg by mouth at bedtime as needed (For sleep.).  Marland Kitchen memantine (NAMENDA) 10 MG tablet Take 10 mg by mouth 2 (two) times daily.  . Multiple Vitamins-Minerals (ICAPS AREDS FORMULA PO) Take 2 capsules by mouth 2 (two) times daily with a meal.   . polyethylene glycol (MIRALAX / GLYCOLAX) packet Take 17 g by mouth daily as needed for mild constipation.  . Probiotic Product ([address]) CAPS Take 1 capsule by mouth 2 (two) times daily.  . psyllium (METAMUCIL) 58.6 % packet 2 tablespoons two times daily  . VOLTAREN 1 % GEL  Apply topically 4 (four) times daily.  Marland Kitchen guaiFENesin (MUCINEX) 600 MG 12 hr tablet Take 600 mg by mouth 2 (two) times daily as needed for cough.  . [DISCONTINUED] D-Mannose POWD Take 2,000 mg by mouth 2 (two) times daily.  :  Review of Systems:  Out of a complete 14 point review of systems, all are reviewed and negative with the exception of these symptoms as listed below:   Review of Systems  All other systems reviewed and are negative.   Objective:  Neurologic Exam  Physical Exam Physical Examination:   Filed Vitals:   11/22/14 1038  BP: 152/79  Pulse: 57  Temp: 97.7 F (36.5 C)   General Examination: The patient is a very pleasant 81 y.o. male in no acute distress. He is frail and minimally verbal, but is actually more responsive and awake today, able to answer and short sentences. He is in his WC and severely stooped and leaning to the L.  HEENT: Normocephalic, atraumatic, pupils are equal, round and reactive to light and accommodation. Extraocular tracking shows severe saccadic breakdown without nystagmus noted. There is severe limitation to entire gaze. There is moderate decrease in eye blink rate and he keeps his eyes closed most of the time but is able to open them on verbal command. Hearing is mildly impaired. Face is symmetric with moderate facial masking and normal facial sensation. There is no lip, neck or jaw tremor. Neck is severely rigid with decreased passive ROM and severe anterocollis and L laterocollis.  There are no carotid bruits on auscultation. Oropharynx exam reveals  mild mouth dryness and partially edentulous state. No significant airway crowding is noted. Mallampati is class III. Tongue protrudes centrally and palate elevates symmetrically. There is no drooling.   Chest: is clear to auscultation without wheezing, rhonchi or crackles noted.  Heart: sounds are regular and normal without murmurs, rubs or gallops noted.   Abdomen: is soft, non-tender and  non-distended with normal bowel sounds appreciated on auscultation.  Extremities: There is no pitting edema in the distal lower extremities bilaterally.   Skin: is warm and dry with no trophic changes noted. Age-related changes are noted on the skin.   Musculoskeletal: exam reveals no obvious joint deformities, tenderness, joint swelling or erythema.  Neurologically:  Mental status: The patient is awake and alert, paying fair attention. He is unable to provide the history. His son provides the entire history. He is oriented to: self, place and situation. His memory, attention, language and knowledge are impaired. On 11/22/2014: MMSE: 6/30. There is no aphasia, agnosia, apraxia or anomia. There is a moderate degree of bradyphrenia. Speech is very scant, moderately hypophonic with mild dysarthria noted. Mood is congruent and affect is fairly normal appearing.    Cranial nerves are as described above under HEENT exam.   Motor exam:  thin bulk, and global strength of 4/5 is noted. There are no dyskinesias noted.   Tone is severely rigid with absence of cogwheeling. There is overall severe bradykinesia. There is no drift or rebound. There is no tremor.  Reflexes are trace in the upper extremities and trace in the lower extremities. Fine motor skills are severely impaired.   Cerebellar testing is not possible.   Sensory exam is intact to light touch.   Gait, station and balance: he stands with full assistance. There is evidence of significant forward tilt and stooped posture, in keeping with camptocormia and he walks with assistance for a few steps and needs verbal cues.   Assessment and Plan:   In summary, JHORDAN MCKIBBEN is a very pleasant 81 year old male with a history of recurrent syncopal spells, parkinsonism, memory loss, gait dysfunction, balance issues, recurrent falls and recurrent urinary tract infections and recurrent C. difficile colitis and URIs, multiple hospitalizations and  complications from deconditioning and overall frailty. His history and exam is consistent with progressive supranuclear palsy, advanced.  I again had a long chat with the patient and  his 2 children today. Thankfully, the patient has been stable lately and has done the best he has in several months, perhaps even a year. His posture is worse. Ricky [name] inquired about a new medication for Parkinson's disease called Duopa. I explained to him that I did not think Mr. Rumpf is a candidate for this. In particular, this is a invasive treatment requiring the placement of a medication delivery to into the intestine and a pump that has to be carried for the entire day and has to be recharged overnight. In addition, the patient has tried Sinemet in the past. We mutually agreed not to try any new medications at this time. I will follow him clinically. He has been tried on Sinemet in the past, and Aricept and is on Namenda. I would like to see him back in 3 to 4 months, sooner if the need arises and encouraged them to call with any interim questions, concerns, problems or updates and refill requests.

## 2014-11-22 NOTE — Patient Instructions (Signed)
We will continue to follow you clinically.  You are

## 2014-11-24 DIAGNOSIS — R339 Retention of urine, unspecified: Secondary | ICD-10-CM

## 2014-11-24 DIAGNOSIS — Z466 Encounter for fitting and adjustment of urinary device: Secondary | ICD-10-CM

## 2014-12-04 ENCOUNTER — Ambulatory Visit (INDEPENDENT_AMBULATORY_CARE_PROVIDER_SITE_OTHER): Payer: 59 | Admitting: Internal Medicine

## 2014-12-04 ENCOUNTER — Encounter: Payer: Self-pay | Admitting: Internal Medicine

## 2014-12-04 VITALS — BP 180/80 | HR 63 | Temp 97.4°F | Resp 18

## 2014-12-04 DIAGNOSIS — T148 Other injury of unspecified body region: Secondary | ICD-10-CM

## 2014-12-04 DIAGNOSIS — G608 Other hereditary and idiopathic neuropathies: Secondary | ICD-10-CM

## 2014-12-04 DIAGNOSIS — W57XXXA Bitten or stung by nonvenomous insect and other nonvenomous arthropods, initial encounter: Secondary | ICD-10-CM

## 2014-12-04 DIAGNOSIS — T148XXA Other injury of unspecified body region, initial encounter: Secondary | ICD-10-CM

## 2014-12-04 DIAGNOSIS — G231 Progressive supranuclear ophthalmoplegia [Steele-Richardson-Olszewski]: Secondary | ICD-10-CM

## 2014-12-04 NOTE — Progress Notes (Signed)
Patient ID: Ricky [name], male   DOB: 22-Mar-1917, 81 y.o.   MRN: [id number]   Location:  [facility]  Code Status: DNR  Allergies  Allergen Reactions  . Other Other (See Comments)    Dairy products- cause swallowing difficulty     Chief Complaint  Patient presents with  . Acute Visit    right outer earlobe swelling and bruising, small bean shaped knot on left side of back, 3 small red bumps on leg 12/02/14    HPI: Patient is a 81 y.o. black male seen in the office today for an acute visit due to bruising and swelling of right ear for past 3 days.  Also had small pea-sized area on back of left ear that came and went.    His son has been working with an elderly male in his home and had some of his items in the house.  This man had bed bugs in the home and now they have them.  Pt has had two areas on his anterior legs with this appearance (his daughter provided pictures).    Review of Systems:  Review of Systems  Constitutional: Negative for fever and chills.  HENT: Negative for congestion.   Eyes: Negative for blurred vision.  Respiratory: Negative for shortness of breath.   Cardiovascular: Negative for chest pain.  Gastrointestinal: Negative for abdominal pain, constipation, blood in stool and melena.  Genitourinary: Negative for dysuria.  Musculoskeletal: Positive for back pain. Negative for falls.  Skin: Positive for rash. Negative for itching.       See hpi  Neurological: Negative for dizziness and loss of consciousness.       PSP  Psychiatric/Behavioral: Positive for memory loss.     Past Medical History  Diagnosis Date  . IBS (irritable bowel syndrome)   . Diverticulosis   . Cataract   . OAB (overactive bladder)   . Macular degeneration   . Alzheimer disease   . Hypertension   . Arthritis   . Osteoporosis   . Parkinsonian syndrome   . Syncope, vasovagal   . Neuromuscular disorder     parkisonian syndrome  .  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 Unspecified transient cerebral ischemia   . Unspecified urinary incontinence   . Senile dementia, uncomplicated   . Unspecified constipation   . Hypothyroidism   . Anemia, unspecified   . Depression   . Alzheimer's disease   . Gout, unspecified   . Thoracic or lumbosacral neuritis or radiculitis, unspecified   . Progressive supranuclear palsy   . Unspecified transient cerebral ischemia   . Unspecified urinary incontinence   . Syncope and collapse   . Paralysis agitans   . Unspecified transient cerebral ischemia   . Pneumonitis due to inhalation of food or vomitus   . Cellulitis and abscess of oral soft tissues   . Screening for lipoid disorders   . Senile dementia, uncomplicated   . Unspecified constipation   . Disorder of bone and cartilage, unspecified   . Other specified acquired hypothyroidism   . Unspecified hypothyroidism   . Anemia, unspecified   . Depressive disorder, not elsewhere classified   . Alzheimer's disease   . Paralysis agitans   . Pneumonia, organism unspecified   . Pseudomonas infection in conditions classified elsewhere and of unspecified site   . Unspecified essential hypertension   . Urinary tract infection, site not specified   . Osteoarthrosis, unspecified whether  generalized or localized, unspecified site   . Osteoporosis, unspecified   . Gout, unspecified   . Thoracic or lumbosacral neuritis or radiculitis, unspecified   . Stroke     Past Surgical History  Procedure Laterality Date  . Esophagogastroduodenoscopy    . Eye surgery  2006    LEFT CATARACT    Social History:   reports that he has quit smoking. He has never used smokeless tobacco. He reports that he does not drink alcohol or use illicit drugs.  Family History  Problem Relation Age of Onset  . Cancer Mother   . Stroke Father   . Liver disease Father   . Kidney disease Brother   . Liver disease Brother   .  Emphysema Brother     Medications: Patient's Medications  New Prescriptions   No medications on file  Previous Medications   ACETAMINOPHEN (TYLENOL) 500 MG TABLET    Take 1,000 mg by mouth 3 (three) times daily.    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   BACITRACIN OINTMENT    Apply 1 application topically 2 (two) times daily. Applies to head of penis to decrease infection from catheter.   CALCIUM CARBONATE-VIT D-MIN (CALTRATE 600+D PLUS) 600-800 MG-UNIT CHEW    Chew 1 tablet by mouth 2 (two) times daily with a meal.    CLOTRIMAZOLE-BETAMETHASONE (LOTRISONE) CREAM    Apply 1 application topically 2 (two) times daily as needed (Applies to body for rash.).    CRANBERRY SOFT PO    Take 2 capsules by mouth 3 (three) times daily.    CYANOCOBALAMIN (VITAMIN B-12) 5000 MCG SUBL    Place 5,000 mcg under the tongue 2 (two) times daily.   FERROUS SULFATE 325 (65 FE) MG TABLET    Take 325 mg by mouth every other day.    GUAIFENESIN (MUCINEX) 600 MG 12 HR TABLET    Take 600 mg by mouth 2 (two) times daily as needed for cough.   HYDRALAZINE (APRESOLINE) 25 MG TABLET    Take 1/2 tablet by mouth twice daily to control blood pressure   LACTOSE FREE NUTRITION (BOOST) LIQD    Take 90 mLs by mouth 3 (three) times daily between meals.   LEVOTHYROXINE (SYNTHROID, LEVOTHROID) 25 MCG TABLET    TAKE 1 TABLET BY MOUTH DAILY   LIDOCAINE (LIDODERM) 5 %    Place 3 patches onto the skin daily. Remove & Discard patch within 12 hours or as directed by MD   MELATONIN 5 MG TABS    Take 5 mg by mouth at bedtime as needed (For sleep.).   MEMANTINE (NAMENDA) 10 MG TABLET    Take 10 mg by mouth 2 (two) times daily.   MULTIPLE VITAMINS-MINERALS (ICAPS AREDS FORMULA PO)    Take 2 capsules by mouth 2 (two) times daily with a meal.    POLYETHYLENE GLYCOL (MIRALAX / GLYCOLAX) PACKET    Take 17 g by mouth daily as needed for  mild constipation.   PROBIOTIC PRODUCT ([place] HEALTH) CAPS    Take 1 capsule by mouth 2 (two) times daily.   PSYLLIUM (METAMUCIL) 58.6 % PACKET    2 tablespoons two times daily   VOLTAREN 1 % GEL    Apply topically 4 (four) times daily.  Modified Medications   No medications on file  Discontinued Medications   No medications  on file     Physical Exam: Filed Vitals:   12/04/14 1325  BP: 180/80  Pulse: 63  Temp: 97.4 F (36.3 C)  TempSrc: Oral  Resp: 18  SpO2: 94%  Physical Exam  Constitutional:  Frail black male seated in wheelchair with hunched posture  Cardiovascular: Normal rate, regular rhythm, normal heart sounds and intact distal pulses.   Pulmonary/Chest: Effort normal and breath sounds normal.  Abdominal: Soft. Bowel sounds are normal.  Neurological:  Has periods of lethargy; does respond to questions but it takes a long time for his response  Skin:  Ecchymoses over upper part of his ear, nontender    Labs reviewed: Basic Metabolic Panel:  Recent Labs  03/08/14 2259  03/21/14 0312  04/13/14 1538 06/28/14 1210 06/29/14 0515  NA 138  < > 137  < > 138 139 135*  K 4.4  < > 3.5*  < > 4.0 5.1 4.0  CL 100  < > 99  < > 99 100 99  CO2 25  < > 25  < > 22 26 23   GLUCOSE 91  < > 107*  < > 86 115* 101*  BUN 11  < > 9  < > 14 18 15   CREATININE 0.82  < > 0.80  < > 0.84 1.05 0.77  CALCIUM 9.2  < > 8.9  < > 9.2 9.7 8.7  TSH 1.490  --  0.804  --   --  2.240  --   < > = values in this interval not displayed. Liver Function Tests:  Recent Labs  03/31/14 1900 04/12/14 0915 06/28/14 1210  AST 21 35 36  ALT 15 25 19   ALKPHOS 96 89 82  BILITOT 0.4 0.5 0.6  PROT 8.1 8.2 8.2  ALBUMIN 3.5 3.7 3.5   No results for input(s): LIPASE, AMYLASE in the last 8760 hours. No results for input(s): AMMONIA in the last 8760 hours. CBC:  Recent Labs  04/12/14 0915 04/13/14 1538 06/28/14 1132 06/29/14 0515 06/30/14 0455 07/01/14 0542  WBC 12.9* 8.3 8.4 6.5 5.9 5.4   NEUTROABS 11.2* 5.2 6.6  --   --   --   HGB 11.4* 10.5* 11.9* 10.5* 10.9* 11.1*  HCT 35.9* 32.9* 37.1* 31.6* 34.4* 34.7*  MCV 87.8 86 87.3 84.9 87.3 86.8  PLT 213 235 197 192 168 200   Lipid Panel: No results for input(s): CHOL, HDL, LDLCALC, TRIG, CHOLHDL, LDLDIRECT in the last 8760 hours. Lab Results  Component Value Date   HGBA1C 5.3 06/21/2012     Assessment/Plan 1. Deep tissue injury -of right ear -discussed keeping pressure off this ear (they already are), but also concerning that other ear could develop similar pressure injury -advised to consider a foam cushion to help with this  2. Supranuclear palsy Progressive -actually a little more alert recently -no changes needed -follows with Dr. Rexene Alberts  3. Bed bug bite -suspected cause of lesions on his legs (have now resolved) -given instructions on means of getting rid of these in the bedsheets/linens/clothes  Labs/tests ordered:  No new Next appt:  Keep as scheduled  Also his daughter's FMLA papers were apparently missing a page (appears we never received this page to complete)  Fynlee Rowlands L. Theo Reither, D.O. [facility] [address], [place] [address] Cell Phone (Mon-Fri 8am-5pm):  [phone] On Call:  [phone] & follow prompts after 5pm & weekends Office Phone:  [phone] Office Fax:  [phone]

## 2014-12-04 NOTE — Patient Instructions (Signed)
Please wash bedding and clothes in hot water to kill any eggs that may have been laid by the bed bugs.    Avoid pressure on right ear until it heals--looks like deep tissue injury.  This can progress to a pressure ulcer.  You could apply a light foam over the ear to prevent this.

## 2014-12-29 ENCOUNTER — Telehealth: Payer: Self-pay | Admitting: *Deleted

## 2014-12-29 NOTE — Telephone Encounter (Signed)
Received Prior Authorization for patient's Lidocaine Patch 5%. Spoke with Elta Guadeloupe at # [phone]. Medication Approved PA# [id number].  [name], daughter Notified [phone]

## 2015-01-10 ENCOUNTER — Inpatient Hospital Stay (HOSPITAL_COMMUNITY)
Admission: EM | Admit: 2015-01-10 | Discharge: 2015-01-14 | DRG: 690 | Disposition: A | Discharge status: Home Health | Payer: Medicare Other | Attending: Internal Medicine | Admitting: Internal Medicine

## 2015-01-10 ENCOUNTER — Encounter (HOSPITAL_COMMUNITY): Payer: Self-pay

## 2015-01-10 ENCOUNTER — Emergency Department (HOSPITAL_COMMUNITY): Payer: Medicare Other

## 2015-01-10 DIAGNOSIS — M1990 Unspecified osteoarthritis, unspecified site: Secondary | ICD-10-CM | POA: Diagnosis present

## 2015-01-10 DIAGNOSIS — E869 Volume depletion, unspecified: Secondary | ICD-10-CM | POA: Diagnosis present

## 2015-01-10 DIAGNOSIS — N400 Enlarged prostate without lower urinary tract symptoms: Secondary | ICD-10-CM | POA: Diagnosis present

## 2015-01-10 DIAGNOSIS — T17908A Unspecified foreign body in respiratory tract, part unspecified causing other injury, initial encounter: Secondary | ICD-10-CM

## 2015-01-10 DIAGNOSIS — Z993 Dependence on wheelchair: Secondary | ICD-10-CM | POA: Diagnosis not present

## 2015-01-10 DIAGNOSIS — Z79899 Other long term (current) drug therapy: Secondary | ICD-10-CM | POA: Diagnosis not present

## 2015-01-10 DIAGNOSIS — N3281 Overactive bladder: Secondary | ICD-10-CM | POA: Diagnosis present

## 2015-01-10 DIAGNOSIS — G309 Alzheimer's disease, unspecified: Secondary | ICD-10-CM | POA: Diagnosis present

## 2015-01-10 DIAGNOSIS — Z87891 Personal history of nicotine dependence: Secondary | ICD-10-CM | POA: Diagnosis not present

## 2015-01-10 DIAGNOSIS — G3183 Dementia with Lewy bodies: Secondary | ICD-10-CM | POA: Diagnosis present

## 2015-01-10 DIAGNOSIS — E871 Hypo-osmolality and hyponatremia: Secondary | ICD-10-CM | POA: Diagnosis present

## 2015-01-10 DIAGNOSIS — H3530 Unspecified macular degeneration: Secondary | ICD-10-CM | POA: Diagnosis present

## 2015-01-10 DIAGNOSIS — F329 Major depressive disorder, single episode, unspecified: Secondary | ICD-10-CM | POA: Diagnosis present

## 2015-01-10 DIAGNOSIS — F0280 Dementia in other diseases classified elsewhere without behavioral disturbance: Secondary | ICD-10-CM | POA: Diagnosis present

## 2015-01-10 DIAGNOSIS — E039 Hypothyroidism, unspecified: Secondary | ICD-10-CM | POA: Diagnosis present

## 2015-01-10 DIAGNOSIS — D638 Anemia in other chronic diseases classified elsewhere: Secondary | ICD-10-CM | POA: Diagnosis present

## 2015-01-10 DIAGNOSIS — Z91011 Allergy to milk products: Secondary | ICD-10-CM

## 2015-01-10 DIAGNOSIS — B964 Proteus (mirabilis) (morganii) as the cause of diseases classified elsewhere: Secondary | ICD-10-CM | POA: Diagnosis present

## 2015-01-10 DIAGNOSIS — M109 Gout, unspecified: Secondary | ICD-10-CM | POA: Diagnosis present

## 2015-01-10 DIAGNOSIS — Z809 Family history of malignant neoplasm, unspecified: Secondary | ICD-10-CM | POA: Diagnosis not present

## 2015-01-10 DIAGNOSIS — K589 Irritable bowel syndrome without diarrhea: Secondary | ICD-10-CM | POA: Diagnosis present

## 2015-01-10 DIAGNOSIS — R1310 Dysphagia, unspecified: Secondary | ICD-10-CM | POA: Diagnosis present

## 2015-01-10 DIAGNOSIS — Z66 Do not resuscitate: Secondary | ICD-10-CM | POA: Diagnosis present

## 2015-01-10 DIAGNOSIS — M810 Age-related osteoporosis without current pathological fracture: Secondary | ICD-10-CM | POA: Diagnosis present

## 2015-01-10 DIAGNOSIS — N3289 Other specified disorders of bladder: Secondary | ICD-10-CM | POA: Diagnosis present

## 2015-01-10 DIAGNOSIS — G231 Progressive supranuclear ophthalmoplegia [Steele-Richardson-Olszewski]: Secondary | ICD-10-CM | POA: Diagnosis present

## 2015-01-10 DIAGNOSIS — Z7982 Long term (current) use of aspirin: Secondary | ICD-10-CM | POA: Diagnosis not present

## 2015-01-10 DIAGNOSIS — Z8673 Personal history of transient ischemic attack (TIA), and cerebral infarction without residual deficits: Secondary | ICD-10-CM

## 2015-01-10 DIAGNOSIS — N390 Urinary tract infection, site not specified: Principal | ICD-10-CM

## 2015-01-10 DIAGNOSIS — R310 Gross hematuria: Secondary | ICD-10-CM | POA: Diagnosis present

## 2015-01-10 DIAGNOSIS — I10 Essential (primary) hypertension: Secondary | ICD-10-CM | POA: Diagnosis present

## 2015-01-10 DIAGNOSIS — R319 Hematuria, unspecified: Secondary | ICD-10-CM | POA: Diagnosis present

## 2015-01-10 DIAGNOSIS — Z825 Family history of asthma and other chronic lower respiratory diseases: Secondary | ICD-10-CM

## 2015-01-10 DIAGNOSIS — Z87440 Personal history of urinary (tract) infections: Secondary | ICD-10-CM | POA: Diagnosis not present

## 2015-01-10 DIAGNOSIS — Z823 Family history of stroke: Secondary | ICD-10-CM

## 2015-01-10 LAB — CBC WITH DIFFERENTIAL/PLATELET
BASOS ABS: 0 10*3/uL (ref 0.0–0.1)
Basophils Relative: 0 % (ref 0–1)
Eosinophils Absolute: 0 10*3/uL (ref 0.0–0.7)
Eosinophils Relative: 1 % (ref 0–5)
HEMATOCRIT: 31.8 % — AB (ref 39.0–52.0)
Hemoglobin: 10.4 g/dL — ABNORMAL LOW (ref 13.0–17.0)
LYMPHS PCT: 27 % (ref 12–46)
Lymphs Abs: 1.3 10*3/uL (ref 0.7–4.0)
MCH: 29.1 pg (ref 26.0–34.0)
MCHC: 32.7 g/dL (ref 30.0–36.0)
MCV: 88.8 fL (ref 78.0–100.0)
Monocytes Absolute: 0.5 10*3/uL (ref 0.1–1.0)
Monocytes Relative: 10 % (ref 3–12)
Neutro Abs: 3.1 10*3/uL (ref 1.7–7.7)
Neutrophils Relative %: 62 % (ref 43–77)
PLATELETS: 178 10*3/uL (ref 150–400)
RBC: 3.58 MIL/uL — ABNORMAL LOW (ref 4.22–5.81)
RDW: 12.6 % (ref 11.5–15.5)
WBC: 4.9 10*3/uL (ref 4.0–10.5)

## 2015-01-10 LAB — URINALYSIS, ROUTINE W REFLEX MICROSCOPIC
Glucose, UA: NEGATIVE mg/dL
Ketones, ur: 40 mg/dL — AB
Nitrite: POSITIVE — AB
SPECIFIC GRAVITY, URINE: 1.018 (ref 1.005–1.030)
UROBILINOGEN UA: 1 mg/dL (ref 0.0–1.0)
pH: 6.5 (ref 5.0–8.0)

## 2015-01-10 LAB — BASIC METABOLIC PANEL
ANION GAP: 8 (ref 5–15)
BUN: 14 mg/dL (ref 6–23)
CHLORIDE: 97 mmol/L (ref 96–112)
CO2: 26 mmol/L (ref 19–32)
Calcium: 8.8 mg/dL (ref 8.4–10.5)
Creatinine, Ser: 0.68 mg/dL (ref 0.50–1.35)
GFR calc non Af Amer: 79 mL/min — ABNORMAL LOW (ref >90)
Glucose, Bld: 86 mg/dL (ref 70–99)
POTASSIUM: 4.3 mmol/L (ref 3.5–5.1)
SODIUM: 131 mmol/L — AB (ref 135–145)

## 2015-01-10 LAB — URINE MICROSCOPIC-ADD ON

## 2015-01-10 MED ORDER — MEMANTINE HCL 10 MG PO TABS
10.0000 mg | ORAL_TABLET | Freq: Two times a day (BID) | ORAL | Status: DC
  Administered 2015-01-10 – 2015-01-14 (×8): 10 mg via ORAL
  Filled 2015-01-10 (×9): qty 1

## 2015-01-10 MED ORDER — POLYETHYLENE GLYCOL 3350 17 G PO PACK: 17.0000 g | PACK | Freq: Every day | ORAL | Status: DC | PRN

## 2015-01-10 MED ORDER — FERROUS SULFATE 325 (65 FE) MG PO TABS
325.0000 mg | ORAL_TABLET | ORAL | Status: DC
  Administered 2015-01-11 – 2015-01-13 (×2): 325 mg via ORAL
  Filled 2015-01-10 (×2): qty 1

## 2015-01-10 MED ORDER — ACETAMINOPHEN 325 MG PO TABS: 650.0000 mg | ORAL_TABLET | Freq: Four times a day (QID) | ORAL | Status: DC | PRN

## 2015-01-10 MED ORDER — LEVOTHYROXINE SODIUM 25 MCG PO TABS
25.0000 ug | ORAL_TABLET | Freq: Every day | ORAL | Status: DC
Start: 2015-01-11 — End: 2015-01-15
  Administered 2015-01-11 – 2015-01-14 (×4): 25 ug via ORAL
  Filled 2015-01-10 (×6): qty 1

## 2015-01-10 MED ORDER — PSYLLIUM 95 % PO PACK
1.0000 | PACK | Freq: Every day | ORAL | Status: DC
  Administered 2015-01-11 – 2015-01-14 (×4): 1 via ORAL
  Filled 2015-01-10 (×4): qty 1

## 2015-01-10 MED ORDER — ONDANSETRON HCL 4 MG PO TABS: 4.0000 mg | ORAL_TABLET | Freq: Four times a day (QID) | ORAL | Status: DC | PRN

## 2015-01-10 MED ORDER — DEXTROSE 5 % IV SOLN
1.0000 g | Freq: Once | INTRAVENOUS | Status: AC
  Administered 2015-01-10: 1 g via INTRAVENOUS
  Filled 2015-01-10 (×2): qty 10

## 2015-01-10 MED ORDER — BOOST PO LIQD
90.0000 mL | Freq: Two times a day (BID) | ORAL | Status: DC
  Administered 2015-01-11 (×2): 90 mL via ORAL
  Filled 2015-01-10 (×4): qty 237

## 2015-01-10 MED ORDER — ACETAMINOPHEN 650 MG RE SUPP: 650.0000 mg | Freq: Four times a day (QID) | RECTAL | Status: DC | PRN

## 2015-01-10 MED ORDER — SODIUM CHLORIDE 0.9 % IV SOLN
INTRAVENOUS | Status: AC
  Administered 2015-01-10: 22:00:00 via INTRAVENOUS

## 2015-01-10 MED ORDER — HYDRALAZINE HCL 25 MG PO TABS
12.5000 mg | ORAL_TABLET | Freq: Two times a day (BID) | ORAL | Status: DC
  Administered 2015-01-10 – 2015-01-14 (×8): 12.5 mg via ORAL
  Filled 2015-01-10 (×9): qty 0.5

## 2015-01-10 MED ORDER — ONDANSETRON HCL 4 MG/2ML IJ SOLN: 4.0000 mg | Freq: Four times a day (QID) | INTRAMUSCULAR | Status: DC | PRN

## 2015-01-10 MED ORDER — ASPIRIN EC 325 MG PO TBEC
325.0000 mg | DELAYED_RELEASE_TABLET | Freq: Every evening | ORAL | Status: DC
  Administered 2015-01-10 – 2015-01-14 (×5): 325 mg via ORAL
  Filled 2015-01-10 (×5): qty 1

## 2015-01-10 NOTE — ED Provider Notes (Signed)
CSN: [id number]     Arrival date & time 01/10/15  1345 History   First MD Initiated Contact with Patient 01/10/15 1508     Chief Complaint  Patient presents with  . Hematuria     (Consider location/radiation/quality/duration/timing/severity/associated sxs/prior Treatment) Patient is a 81 y.o. male presenting with hematuria. The history is provided by a relative and a caregiver.  Hematuria This is a recurrent problem. The current episode started 12 to 24 hours ago. The problem occurs constantly. The problem has been gradually worsening. Pertinent negatives include no abdominal pain. Associated symptoms comments: Today pt has not put out anything in the catheter bag and large clots coming out of the foley.  No fever or mental status changes.  Needs new foley in 2 days which he gets replaced monthly. Nothing aggravates the symptoms. Nothing relieves the symptoms. He has tried nothing for the symptoms. The treatment provided no relief.    Past Medical History  Diagnosis Date  . IBS (irritable bowel syndrome)   . Diverticulosis   . Cataract   . OAB (overactive bladder)   . Macular degeneration   . Alzheimer disease   . Hypertension   . Arthritis   . Osteoporosis   . Parkinsonian syndrome   . Syncope, vasovagal   . Neuromuscular disorder     parkisonian syndrome  . 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 Unspecified transient cerebral ischemia   . Unspecified urinary incontinence   . Senile dementia, uncomplicated   . Unspecified constipation   . Hypothyroidism   . Anemia, unspecified   . Depression   . Alzheimer's disease   . Gout, unspecified   . Thoracic or lumbosacral neuritis or radiculitis, unspecified   . Progressive supranuclear palsy   . Unspecified transient cerebral ischemia   . Unspecified urinary incontinence   . Syncope and collapse   . Paralysis agitans   . Unspecified transient cerebral ischemia   . Pneumonitis due to inhalation  of food or vomitus   . Cellulitis and abscess of oral soft tissues   . Screening for lipoid disorders   . Senile dementia, uncomplicated   . Unspecified constipation   . Disorder of bone and cartilage, unspecified   . Other specified acquired hypothyroidism   . Unspecified hypothyroidism   . Anemia, unspecified   . Depressive disorder, not elsewhere classified   . Alzheimer's disease   . Paralysis agitans   . Pneumonia, organism unspecified   . Pseudomonas infection in conditions classified elsewhere and of unspecified site   . Unspecified essential hypertension   . Urinary tract infection, site not specified   . Osteoarthrosis, unspecified whether generalized or localized, unspecified site   . Osteoporosis, unspecified   . Gout, unspecified   . Thoracic or lumbosacral neuritis or radiculitis, unspecified   . Stroke    Past Surgical History  Procedure Laterality Date  . Esophagogastroduodenoscopy    . Eye surgery  2006    LEFT CATARACT   Family History  Problem Relation Age of Onset  . Cancer Mother   . Stroke Father   . Liver disease Father   . Kidney disease Brother   . Liver disease Brother   . Emphysema Brother    History  Substance Use Topics  . Smoking status: Former Research scientist (life sciences)  . Smokeless tobacco: Never Used     Comment: QUIT SMOKING BACK IN THE LATE 50'S  . Alcohol Use: No    Review of Systems  Constitutional: Negative  for fever.  Gastrointestinal: Negative for abdominal pain.  Genitourinary: Positive for hematuria.  All other systems reviewed and are negative.     Allergies  Other  Home Medications   Prior to Admission medications   Medication Sig Start Date End Date Taking? Authorizing Provider  acetaminophen (TYLENOL) 500 MG tablet Take 1,000 mg by mouth 3 (three) times daily.    Yes Historical Provider, MD  AMBULATORY NON FORMULARY MEDICATION Pressure Air Mattress Dx: G60.8, L89.150 10/30/14  Yes Tiffany L Reed, DO  guaiFENesin (MUCINEX) 600 MG  12 hr tablet Take 600 mg by mouth 2 (two) times daily as needed for cough.   Yes Historical Provider, MD  levothyroxine (SYNTHROID, LEVOTHROID) 25 MCG tablet TAKE 1 TABLET BY MOUTH DAILY 10/19/14  Yes Tiffany L Reed, DO  memantine (NAMENDA) 10 MG tablet Take 10 mg by mouth 2 (two) times daily.   Yes Historical Provider, MD  Multiple Vitamins-Minerals (ICAPS AREDS FORMULA PO) Take 2 capsules by mouth 2 (two) times daily with a meal.    Yes Historical Provider, MD  Probiotic Product ([place]) CAPS Take 1 capsule by mouth 2 (two) times daily.   Yes Historical Provider, MD  psyllium (METAMUCIL) 58.6 % packet 2 tablespoons two times daily   Yes Historical Provider, MD  aspirin EC 325 MG tablet Take 325 mg by mouth every evening.    Historical Provider, MD  bacitracin ointment Apply 1 application topically 2 (two) times daily. Applies to head of penis to decrease infection from catheter.    Historical Provider, MD  Calcium Carbonate-Vit D-Min (CALTRATE 600+D PLUS) 600-800 MG-UNIT CHEW Chew 1 tablet by mouth 2 (two) times daily with a meal.     Historical Provider, MD  clotrimazole-betamethasone (LOTRISONE) cream Apply 1 application topically 2 (two) times daily as needed (Applies to body for rash.).  02/01/14   Historical Provider, MD  CRANBERRY SOFT PO Take 2 capsules by mouth 3 (three) times daily.     Historical Provider, MD  Cyanocobalamin (VITAMIN B-12) 5000 MCG SUBL Place 5,000 mcg under the tongue 2 (two) times daily.    Historical Provider, MD  ferrous sulfate 325 (65 FE) MG tablet Take 325 mg by mouth every other day.     Historical Provider, MD  hydrALAZINE (APRESOLINE) 25 MG tablet Take 1/2 tablet by mouth twice daily to control blood pressure 11/14/14   Blanchie Serve, MD  lactose free nutrition (BOOST) LIQD Take 90 mLs by mouth 3 (three) times daily between meals. Patient taking differently: Take 90 mLs by mouth 2 (two) times daily between meals.  07/01/14   Barton Dubois, MD   lidocaine (LIDODERM) 5 % Place 3 patches onto the skin daily. Remove & Discard patch within 12 hours or as directed by MD Patient taking differently: Place 2 patches onto the skin daily. Remove & Discard patch within 12 hours or as directed by MD 08/29/14   Gayland Curry, DO  Melatonin 5 MG TABS Take 5 mg by mouth at bedtime as needed (For sleep.).    Historical Provider, MD  polyethylene glycol (MIRALAX / GLYCOLAX) packet Take 17 g by mouth daily as needed for mild constipation. 07/01/14   Barton Dubois, MD  VOLTAREN 1 % GEL Apply 2 g topically 4 (four) times daily.  07/10/14   Historical Provider, MD   BP 147/75 mmHg  Pulse 65  Temp(Src) 98 F (36.7 C) (Oral)  Resp 20  SpO2 100% Physical Exam  Constitutional: He appears well-developed and well-nourished.  No distress.  HENT:  Head: Normocephalic and atraumatic.  Mouth/Throat: Oropharynx is clear and moist.  Eyes: Conjunctivae and EOM are normal. Pupils are equal, round, and reactive to light.  Neck: Normal range of motion. Neck supple.  Cardiovascular: Normal rate, regular rhythm and intact distal pulses.   No murmur heard. Pulmonary/Chest: Effort normal and breath sounds normal. No respiratory distress. He has no wheezes. He has no rales.  Abdominal: Soft. He exhibits no distension. There is no tenderness. There is no rebound and no guarding.  Genitourinary:  Foley present. Clot present in diaper.  No urine in the foley bag  Musculoskeletal: Normal range of motion. He exhibits no edema or tenderness.  Neurological: He is alert.  Skin: Skin is warm and dry. No rash noted. No erythema.  Psychiatric: He has a normal mood and affect. His behavior is normal.  Nursing note and vitals reviewed.   ED Course  Procedures (including critical care time) Labs Review Labs Reviewed  URINALYSIS, ROUTINE W REFLEX MICROSCOPIC - Abnormal; Notable for the following:    Color, Urine RED (*)    APPearance TURBID (*)    Hgb urine dipstick LARGE  (*)    Bilirubin Urine LARGE (*)    Ketones, ur 40 (*)    Protein, ur >300 (*)    Nitrite POSITIVE (*)    Leukocytes, UA LARGE (*)    All other components within normal limits  CBC WITH DIFFERENTIAL/PLATELET - Abnormal; Notable for the following:    RBC 3.58 (*)    Hemoglobin 10.4 (*)    HCT 31.8 (*)    All other components within normal limits  BASIC METABOLIC PANEL - Abnormal; Notable for the following:    Sodium 131 (*)    GFR calc non Af Amer 79 (*)    All other components within normal limits  URINE CULTURE  URINE MICROSCOPIC-ADD ON    Imaging Review No results found.   EKG Interpretation None      MDM   Final diagnoses:  Aspiration into airway  UTI (lower urinary tract infection)    Patient with approximately 24 hours of hematuria and not draining into his chronic Foley catheter bag. Large clot coming around the catheter but otherwise son states his dad is not his normal baseline. He does not take blood thinners. He has intermittent bloody urine due to trauma not consistently. He's never passed clots like this. His last urinary tract infection was proximally 6 months ago.  Patient is awake and alert. He denies any abdominal pain. There is gross blood and clot and the patient's diaper around the Foley catheter. No urine draining into the bag. Since states he change the bag at 9 AM this morning and there's been no urine output except from what has been in his depends.  CBC, BMP, UA pending. Family catheter placed as he was due to have a new catheter in on Friday and will irrigate. Bedside ultrasound shows no signs of urinary retention. He was seen by Dr. Kellie Simmering with urology  7:03 PM CBC and BMP without significant findings. UA with evidence of urinary tract infection. Discussed findings with son who does not feel that his static and take oral medication appropriately. He also is concerned that over the last few weeks he's been aspirating his food. States that usually when  he gets urinary tract infection he requires IV antibiotics. Looking at past cultures in April of last year patient had Proteus and Pseudomonas which were sensitive to ceftriaxone.  We'll give IV ceftriaxone initially and discussed with the hospitalist  Blanchie Dessert, MD 01/10/15 1919

## 2015-01-10 NOTE — ED Notes (Signed)
With much difficulty, we were able to d/c old foley cath. Dr. Maryan Rued removed it; and I inserted a new #18 Fr. Foley without difficulty.

## 2015-01-10 NOTE — ED Notes (Signed)
His son remains with him at all times.

## 2015-01-10 NOTE — ED Notes (Signed)
Bed: DP82 Expected date:  Expected time:  Means of arrival:  Comments: EMS- hematuria, blood clots in foley bag

## 2015-01-10 NOTE — ED Notes (Signed)
His son, who is his caregiver, tells [place] pt. Has had gross bleeding with clots from his foley since Friday.  He states pt. Has had foley catheter x 2 years which they typically change every Friday.  It is a size 18 non-latex silver permeated catheter.

## 2015-01-10 NOTE — ED Notes (Signed)
His son reports bleeding from and around his (chronic) foley catheter.  He is in no distress.

## 2015-01-10 NOTE — H&P (Signed)
PCP: Hollace Kinnier, DO    Chief Complaint:  Blood in Urine  HPI: Ricky [name] is a 81 y.o. male   has a past medical history of IBS (irritable bowel syndrome); Diverticulosis; Cataract; OAB (overactive bladder); Macular degeneration; Alzheimer disease; Hypertension; Arthritis; Osteoporosis; Parkinsonian syndrome; Syncope, vasovagal; Neuromuscular disorder; Dementia due to Parkinson's disease without behavioral disturbance; Unspecified transient cerebral ischemia; Unspecified urinary incontinence; Senile dementia, uncomplicated; Unspecified constipation; Hypothyroidism; Anemia, unspecified; Depression; Alzheimer's disease; Gout, unspecified; Thoracic or lumbosacral neuritis or radiculitis, unspecified; Progressive supranuclear palsy; Unspecified transient cerebral ischemia; Unspecified urinary incontinence; Syncope and collapse; Paralysis agitans; Unspecified transient cerebral ischemia; Pneumonitis due to inhalation of food or vomitus; Cellulitis and abscess of oral soft tissues; Screening for lipoid disorders; Senile dementia, uncomplicated; Unspecified constipation; Disorder of bone and cartilage, unspecified; Other specified acquired hypothyroidism; Unspecified hypothyroidism; Anemia, unspecified; Depressive disorder, not elsewhere classified; Alzheimer's disease; Paralysis agitans; Pneumonia, organism unspecified; Pseudomonas infection in conditions classified elsewhere and of unspecified site; Unspecified essential hypertension; Urinary tract infection, site not specified; Osteoarthrosis, unspecified whether generalized or localized, unspecified site; Osteoporosis, unspecified; Gout, unspecified; Thoracic or lumbosacral neuritis or radiculitis, unspecified; and Stroke.   Presented with  Patient with supranuclear palsy lives at home with his son. He has chronic indwelling foley due to BPH since 2014. In the past he have had recurrent UTI. He have had recurrent blood in urine every month.  Usually due to trauma. His son noted some blood in urine today. Son reports he has had some bladder spasms which is usual. Today he have had less urine output. His nurse came to change catheter.    There was large amount of clots that came around the catheter. Patient was brought to ER. In ER they were able to exchange catheter and clear out calcifications. Urine showed large amount of WBC and nitrites. Family denies any fever. No significant confusion. Son feels he has been aspirating but unable to change diet due to patient not tolerating.   Hospitalist was called for admission for  UTI  Review of Systems:    Pertinent positives include: blood in urine, cough after eating  Constitutional:  No weight loss, night sweats, Fevers, chills, fatigue, weight loss  HEENT:  No headaches, Difficulty swallowing,Tooth/dental problems,Sore throat,  No sneezing, itching, ear ache, nasal congestion, post nasal drip,  Cardio-vascular:  No chest pain, Orthopnea, PND, anasarca, dizziness, palpitations.no Bilateral lower extremity swelling  GI:  No heartburn, indigestion, abdominal pain, nausea, vomiting, diarrhea, change in bowel habits, loss of appetite, melena, blood in stool, hematemesis Resp:  no shortness of breath at rest. No dyspnea on exertion, No excess mucus, no productive cough, No non-productive cough, No coughing up of blood.No change in color of mucus.No wheezing. Skin:  no rash or lesions. No jaundice GU:  no dysuria, change in color of urine, no urgency or frequency. No straining to urinate.  No flank pain.  Musculoskeletal:  No joint pain or no joint swelling. No decreased range of motion. No back pain.  Psych:  No change in mood or affect. No depression or anxiety. No memory loss.  Neuro: no localizing neurological complaints, no tingling, no weakness, no double vision, no gait abnormality, no slurred speech, no confusion  Otherwise ROS are negative except for above, 10 systems were  reviewed  Past Medical History: Past Medical History  Diagnosis Date  . IBS (irritable bowel syndrome)   . Diverticulosis   . Cataract   . OAB (overactive bladder)   . Macular degeneration   .  Alzheimer disease   . Hypertension   . Arthritis   . Osteoporosis   . Parkinsonian syndrome   . Syncope, vasovagal   . Neuromuscular disorder     parkisonian syndrome  . 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 Unspecified transient cerebral ischemia   . Unspecified urinary incontinence   . Senile dementia, uncomplicated   . Unspecified constipation   . Hypothyroidism   . Anemia, unspecified   . Depression   . Alzheimer's disease   . Gout, unspecified   . Thoracic or lumbosacral neuritis or radiculitis, unspecified   . Progressive supranuclear palsy   . Unspecified transient cerebral ischemia   . Unspecified urinary incontinence   . Syncope and collapse   . Paralysis agitans   . Unspecified transient cerebral ischemia   . Pneumonitis due to inhalation of food or vomitus   . Cellulitis and abscess of oral soft tissues   . Screening for lipoid disorders   . Senile dementia, uncomplicated   . Unspecified constipation   . Disorder of bone and cartilage, unspecified   . Other specified acquired hypothyroidism   . Unspecified hypothyroidism   . Anemia, unspecified   . Depressive disorder, not elsewhere classified   . Alzheimer's disease   . Paralysis agitans   . Pneumonia, organism unspecified   . Pseudomonas infection in conditions classified elsewhere and of unspecified site   . Unspecified essential hypertension   . Urinary tract infection, site not specified   . Osteoarthrosis, unspecified whether generalized or localized, unspecified site   . Osteoporosis, unspecified   . Gout, unspecified   . Thoracic or lumbosacral neuritis or radiculitis, unspecified   . Stroke    Past Surgical History  Procedure Laterality Date  .  Esophagogastroduodenoscopy    . Eye surgery  2006    LEFT CATARACT     Medications: Prior to Admission medications   Medication Sig Start Date End Date Taking? Authorizing Provider  acetaminophen (TYLENOL) 500 MG tablet Take 1,000 mg by mouth 3 (three) times daily.    Yes Historical Provider, MD  AMBULATORY NON FORMULARY MEDICATION Pressure Air Mattress Dx: G60.8, L89.150 10/30/14  Yes Tiffany L Reed, DO  guaiFENesin (MUCINEX) 600 MG 12 hr tablet Take 600 mg by mouth 2 (two) times daily as needed for cough.   Yes Historical Provider, MD  levothyroxine (SYNTHROID, LEVOTHROID) 25 MCG tablet TAKE 1 TABLET BY MOUTH DAILY 10/19/14  Yes Tiffany L Reed, DO  memantine (NAMENDA) 10 MG tablet Take 10 mg by mouth 2 (two) times daily.   Yes Historical Provider, MD  Multiple Vitamins-Minerals (ICAPS AREDS FORMULA PO) Take 2 capsules by mouth 2 (two) times daily with a meal.    Yes Historical Provider, MD  Probiotic Product ([place]) CAPS Take 1 capsule by mouth 2 (two) times daily.   Yes Historical Provider, MD  psyllium (METAMUCIL) 58.6 % packet 2 tablespoons two times daily   Yes Historical Provider, MD  aspirin EC 325 MG tablet Take 325 mg by mouth every evening.    Historical Provider, MD  bacitracin ointment Apply 1 application topically 2 (two) times daily. Applies to head of penis to decrease infection from catheter.    Historical Provider, MD  Calcium Carbonate-Vit D-Min (CALTRATE 600+D PLUS) 600-800 MG-UNIT CHEW Chew 1 tablet by mouth 2 (two) times daily with a meal.     Historical Provider, MD  clotrimazole-betamethasone (LOTRISONE) cream Apply 1 application topically 2 (two) times daily as needed (  Applies to body for rash.).  02/01/14   Historical Provider, MD  CRANBERRY SOFT PO Take 2 capsules by mouth 3 (three) times daily.     Historical Provider, MD  Cyanocobalamin (VITAMIN B-12) 5000 MCG SUBL Place 5,000 mcg under the tongue 2 (two) times daily.    Historical Provider, MD    ferrous sulfate 325 (65 FE) MG tablet Take 325 mg by mouth every other day.     Historical Provider, MD  hydrALAZINE (APRESOLINE) 25 MG tablet Take 1/2 tablet by mouth twice daily to control blood pressure 11/14/14   Blanchie Serve, MD  lactose free nutrition (BOOST) LIQD Take 90 mLs by mouth 3 (three) times daily between meals. Patient taking differently: Take 90 mLs by mouth 2 (two) times daily between meals.  07/01/14   Barton Dubois, MD  lidocaine (LIDODERM) 5 % Place 3 patches onto the skin daily. Remove & Discard patch within 12 hours or as directed by MD Patient taking differently: Place 2 patches onto the skin daily. Remove & Discard patch within 12 hours or as directed by MD 08/29/14   Gayland Curry, DO  Melatonin 5 MG TABS Take 5 mg by mouth at bedtime as needed (For sleep.).    Historical Provider, MD  polyethylene glycol (MIRALAX / GLYCOLAX) packet Take 17 g by mouth daily as needed for mild constipation. 07/01/14   Barton Dubois, MD  VOLTAREN 1 % GEL Apply 2 g topically 4 (four) times daily.  07/10/14   Historical Provider, MD    Allergies:   Allergies  Allergen Reactions  . Other Other (See Comments)    Dairy products- cause swallowing difficulty     Social History:  Ambulatory  wheelchair bound, needs full assist Lives at home  With family     reports that he has quit smoking. He has never used smokeless tobacco. He reports that he does not drink alcohol or use illicit drugs.    Family History: family history includes Cancer in his mother; Emphysema in his brother; Kidney disease in his brother; Liver disease in his brother and father; Stroke in his father.    Physical Exam: Patient Vitals for the past 24 hrs:  BP Temp Temp src Pulse Resp SpO2  01/10/15 1800 131/68 mmHg - - 71 16 99 %  01/10/15 1628 136/70 mmHg 98.4 F (36.9 C) Oral 66 14 95 %  01/10/15 1347 147/75 mmHg 98 F (36.7 C) Oral 65 20 100 %    1. General:  in No Acute distress 2. Psychological:  Alert not Oriented 3. Head/ENT:     Dry Mucous Membranes                          Head Non traumatic, neck supple                          Normal   Dentition 4. SKIN:  decreased Skin turgor,  Skin clean Dry and intact no rash 5. Heart: Regular rate and rhythm no Murmur, Rub or gallop 6. Lungs: Clear to auscultation bilaterally, no wheezes or crackles   7. Abdomen: Soft, non-tender, Non distended 8. Lower extremities: no clubbing, cyanosis, or edema 9. Neurologically Grossly intact, moving all 4 extremities equally 10. MSK: Normal range of motion  body mass index is unknown because there is no weight on file.   Labs on Admission:   Results for orders placed or performed during  the hospital encounter of 01/10/15 (from the past 24 hour(s))  CBC with Differential/Platelet     Status: Abnormal   Collection Time: 01/10/15  4:09 PM  Result Value Ref Range   WBC 4.9 4.0 - 10.5 K/uL   RBC 3.58 (L) 4.22 - 5.81 MIL/uL   Hemoglobin 10.4 (L) 13.0 - 17.0 g/dL   HCT 31.8 (L) 39.0 - 52.0 %   MCV 88.8 78.0 - 100.0 fL   MCH 29.1 26.0 - 34.0 pg   MCHC 32.7 30.0 - 36.0 g/dL   RDW 12.6 11.5 - 15.5 %   Platelets 178 150 - 400 K/uL   Neutrophils Relative % 62 43 - 77 %   Neutro Abs 3.1 1.7 - 7.7 K/uL   Lymphocytes Relative 27 12 - 46 %   Lymphs Abs 1.3 0.7 - 4.0 K/uL   Monocytes Relative 10 3 - 12 %   Monocytes Absolute 0.5 0.1 - 1.0 K/uL   Eosinophils Relative 1 0 - 5 %   Eosinophils Absolute 0.0 0.0 - 0.7 K/uL   Basophils Relative 0 0 - 1 %   Basophils Absolute 0.0 0.0 - 0.1 K/uL  Basic metabolic panel     Status: Abnormal   Collection Time: 01/10/15  4:09 PM  Result Value Ref Range   Sodium 131 (L) 135 - 145 mmol/L   Potassium 4.3 3.5 - 5.1 mmol/L   Chloride 97 96 - 112 mmol/L   CO2 26 19 - 32 mmol/L   Glucose, Bld 86 70 - 99 mg/dL   BUN 14 6 - 23 mg/dL   Creatinine, Ser 0.68 0.50 - 1.35 mg/dL   Calcium 8.8 8.4 - 10.5 mg/dL   GFR calc non Af Amer 79 (L) >90 mL/min   GFR calc Af Amer  >90 >90 mL/min   Anion gap 8 5 - 15  Urinalysis, Routine w reflex microscopic     Status: Abnormal   Collection Time: 01/10/15  5:28 PM  Result Value Ref Range   Color, Urine RED (A) YELLOW   APPearance TURBID (A) CLEAR   Specific Gravity, Urine 1.018 1.005 - 1.030   pH 6.5 5.0 - 8.0   Glucose, UA NEGATIVE NEGATIVE mg/dL   Hgb urine dipstick LARGE (A) NEGATIVE   Bilirubin Urine LARGE (A) NEGATIVE   Ketones, ur 40 (A) NEGATIVE mg/dL   Protein, ur >300 (A) NEGATIVE mg/dL   Urobilinogen, UA 1.0 0.0 - 1.0 mg/dL   Nitrite POSITIVE (A) NEGATIVE   Leukocytes, UA LARGE (A) NEGATIVE  Urine microscopic-add on     Status: None   Collection Time: 01/10/15  5:28 PM  Result Value Ref Range   WBC, UA FIELD OBSCURED BY RBC'S <3 WBC/hpf   RBC / HPF TOO NUMEROUS TO COUNT <3 RBC/hpf   Urine-Other URINALYSIS PERFORMED ON SUPERNATANT     UA Too numerous to count RBC and WBC  Lab Results  Component Value Date   HGBA1C 5.3 06/21/2012    CrCl cannot be calculated (Unknown ideal weight.).  BNP (last 3 results)  Recent Labs  03/21/14 0312  PROBNP 3769.0*    Other results:  I have pearsonaly reviewed this: ECG not obtained   There were no vitals filed for this visit.   Cultures:    Component Value Date/Time   SDES URINE, CATHETERIZED 03/31/2014 1914   SPECREQUEST NONE 03/31/2014 1914   CULT YEAST Performed at [facility] 03/31/2014 1914   REPTSTATUS 04/02/2014 FINAL 03/31/2014 1914     Radiological  Exams on Admission: Dg Chest Port 1 View  01/10/2015   CLINICAL DATA:  Possible aspiration and airway. Patient is here for Foley catheter problems.  EXAM: PORTABLE CHEST - 1 VIEW  COMPARISON:  06/29/2014  FINDINGS: Diffuse emphysematous changes and fibrosis in the lungs. Nodular opacities in both lungs, measuring up to 11 mm diameter. These were not definitely present previously. Suggesting can't exclude pulmonary nodules. Additional calcified granulomas are present as before.  No focal airspace disease or consolidation in the lungs. No blunting of costophrenic angles. No pneumothorax. Calcified and tortuous aorta. Degenerative changes in the shoulders. Esophageal hiatal hernia behind the heart  IMPRESSION: Emphysematous changes and scattered fibrosis in the lungs. Suggestion of new pulmonary nodules bilaterally. CT suggested for further evaluation. No focal airspace disease or consolidation.   Electronically Signed   By: Lucienne Capers M.D.   On: 01/10/2015 19:12    Chart has been reviewed  Assessment/Plan  81 yo M with hx of supranuclear palsy resulting in dementia and hx of chronic indwelling catheter due to hx of BPH with recurrent UTI growing yeast for the past 2 times and Proteus + pseudomonas prior to that. Patient here with significant hematuria  Present on Admission:  . UTI (lower urinary tract infection) - unclear if two UTI vs large amount of blood in urine. Had one dose of rocephin, afebrile no WBC elevation, hx of c.diff in the past will hold off onantibiotics for now until cultures are back or can be restarted if decompensates.  . Anemia, chronic disease - chronic but given perfused hematuria will recheck and follow. Transfuse as needed.  Marland Kitchen Hyponatremia - chronic, some what worse from baseline, will give gentle IVF check urine electrolytes . Gross hematuria - possibly post traumatic son states patient pulls on  Catheter a lot. Urology consult in AM given severity if does not improve.  . Supranuclear palsies, progressive - chronic,stable . Hypertension - continue home meds. Liable bp dysphagia - family states patient does not tolerate thickened fluids, they understand the risk of aspiration, will assess for dysphagia to see if speech pathology can make any specific suggestions to prevent aspiration.   Prophylaxis: SCD , Protonix  CODE STATUS:  DNR/DNI  Other plan as per orders.  I have spent a total of 55 min on this  admission  Tanisa Lagace 01/10/2015, 7:29 PM  [facility]  Pager [phone]   after 2 AM please page floor coverage PA If 7AM-7PM, please contact the day team taking care of the patient  [email or web address]  Password [email or web address]

## 2015-01-11 ENCOUNTER — Telehealth: Payer: Self-pay

## 2015-01-11 DIAGNOSIS — E871 Hypo-osmolality and hyponatremia: Secondary | ICD-10-CM

## 2015-01-11 DIAGNOSIS — G231 Progressive supranuclear ophthalmoplegia [Steele-Richardson-Olszewski]: Secondary | ICD-10-CM

## 2015-01-11 DIAGNOSIS — I10 Essential (primary) hypertension: Secondary | ICD-10-CM

## 2015-01-11 LAB — CBC
HCT: 29.8 % — ABNORMAL LOW (ref 39.0–52.0)
HCT: 30.2 % — ABNORMAL LOW (ref 39.0–52.0)
HCT: 30.9 % — ABNORMAL LOW (ref 39.0–52.0)
HEMATOCRIT: 29.6 % — AB (ref 39.0–52.0)
Hemoglobin: 10.1 g/dL — ABNORMAL LOW (ref 13.0–17.0)
Hemoglobin: 9.6 g/dL — ABNORMAL LOW (ref 13.0–17.0)
Hemoglobin: 9.7 g/dL — ABNORMAL LOW (ref 13.0–17.0)
Hemoglobin: 9.9 g/dL — ABNORMAL LOW (ref 13.0–17.0)
MCH: 28.6 pg (ref 26.0–34.0)
MCH: 29.1 pg (ref 26.0–34.0)
MCH: 29.2 pg (ref 26.0–34.0)
MCH: 29.6 pg (ref 26.0–34.0)
MCHC: 32.1 g/dL (ref 30.0–36.0)
MCHC: 32.4 g/dL (ref 30.0–36.0)
MCHC: 32.7 g/dL (ref 30.0–36.0)
MCHC: 33.2 g/dL (ref 30.0–36.0)
MCV: 89 fL (ref 78.0–100.0)
MCV: 89.1 fL (ref 78.0–100.0)
MCV: 89.3 fL (ref 78.0–100.0)
MCV: 89.7 fL (ref 78.0–100.0)
PLATELETS: 200 10*3/uL (ref 150–400)
PLATELETS: 203 10*3/uL (ref 150–400)
Platelets: 190 10*3/uL (ref 150–400)
Platelets: 197 10*3/uL (ref 150–400)
RBC: 3.3 MIL/uL — AB (ref 4.22–5.81)
RBC: 3.35 MIL/uL — ABNORMAL LOW (ref 4.22–5.81)
RBC: 3.39 MIL/uL — ABNORMAL LOW (ref 4.22–5.81)
RBC: 3.46 MIL/uL — ABNORMAL LOW (ref 4.22–5.81)
RDW: 12.5 % (ref 11.5–15.5)
RDW: 12.5 % (ref 11.5–15.5)
RDW: 12.6 % (ref 11.5–15.5)
RDW: 12.7 % (ref 11.5–15.5)
WBC: 6.9 10*3/uL (ref 4.0–10.5)
WBC: 6.6 10*3/uL (ref 4.0–10.5)
WBC: 5 10*3/uL (ref 4.0–10.5)
WBC: 7.4 10*3/uL (ref 4.0–10.5)

## 2015-01-11 LAB — COMPREHENSIVE METABOLIC PANEL
ALT: 12 U/L (ref 0–53)
AST: 26 U/L (ref 0–37)
Albumin: 3.3 g/dL — ABNORMAL LOW (ref 3.5–5.2)
Alkaline Phosphatase: 55 U/L (ref 39–117)
Anion gap: 7 (ref 5–15)
BUN: 11 mg/dL (ref 6–23)
CALCIUM: 8.4 mg/dL (ref 8.4–10.5)
CO2: 25 mmol/L (ref 19–32)
Chloride: 97 mmol/L (ref 96–112)
Creatinine, Ser: 0.67 mg/dL (ref 0.50–1.35)
GFR calc Af Amer: >90 mL/min (ref >90)
GFR calc non Af Amer: 79 mL/min — ABNORMAL LOW (ref >90)
Glucose, Bld: 84 mg/dL (ref 70–99)
Potassium: 4.2 mmol/L (ref 3.5–5.1)
SODIUM: 129 mmol/L — AB (ref 135–145)
Total Bilirubin: 0.7 mg/dL (ref 0.3–1.2)
Total Protein: 6.3 g/dL (ref 6.0–8.3)

## 2015-01-11 LAB — SODIUM, URINE, RANDOM: SODIUM UR: 87 meq/L

## 2015-01-11 LAB — TYPE AND SCREEN
ABO/RH(D): AB POS
Antibody Screen: NEGATIVE

## 2015-01-11 LAB — OSMOLALITY, URINE: OSMOLALITY UR: 503 mosm/kg (ref 390–1090)

## 2015-01-11 LAB — CREATININE, URINE, RANDOM: Creatinine, Urine: 93.4 mg/dL

## 2015-01-11 LAB — MAGNESIUM: Magnesium: 1.9 mg/dL (ref 1.5–2.5)

## 2015-01-11 LAB — PHOSPHORUS: Phosphorus: 3.2 mg/dL (ref 2.3–4.6)

## 2015-01-11 LAB — TSH: TSH: 0.842 u[IU]/mL (ref 0.350–4.500)

## 2015-01-11 MED ORDER — SODIUM CHLORIDE 0.9 % IV SOLN
INTRAVENOUS | Status: AC
  Administered 2015-01-11: 17:00:00 via INTRAVENOUS

## 2015-01-11 MED ORDER — CEFTRIAXONE SODIUM IN DEXTROSE 20 MG/ML IV SOLN
1.0000 g | INTRAVENOUS | Status: DC
  Administered 2015-01-11 – 2015-01-13 (×3): 1 g via INTRAVENOUS
  Filled 2015-01-11 (×3): qty 50

## 2015-01-11 NOTE — Evaluation (Signed)
Clinical/Bedside Swallow Evaluation Patient Details  Name: Ricky [name] MRN: [id number] Date of Birth: November 20, 1916  Today's Date: 01/11/2015 Time: SLP Start Time (ACUTE ONLY): 1500 SLP Stop Time (ACUTE ONLY): 1520 SLP Time Calculation (min) (ACUTE ONLY): 20 min  Past Medical History:  Past Medical History  Diagnosis Date  . IBS (irritable bowel syndrome)   . Diverticulosis   . Cataract   . OAB (overactive bladder)   . Macular degeneration   . Alzheimer disease   . Hypertension   . Arthritis   . Osteoporosis   . Parkinsonian syndrome   . Syncope, vasovagal   . Neuromuscular disorder     parkisonian syndrome  . 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 Unspecified transient cerebral ischemia   . Unspecified urinary incontinence   . Senile dementia, uncomplicated   . Unspecified constipation   . Hypothyroidism   . Anemia, unspecified   . Depression   . Alzheimer's disease   . Gout, unspecified   . Thoracic or lumbosacral neuritis or radiculitis, unspecified   . Progressive supranuclear palsy   . Unspecified transient cerebral ischemia   . Unspecified urinary incontinence   . Syncope and collapse   . Paralysis agitans   . Unspecified transient cerebral ischemia   . Pneumonitis due to inhalation of food or vomitus   . Cellulitis and abscess of oral soft tissues   . Screening for lipoid disorders   . Senile dementia, uncomplicated   . Unspecified constipation   . Disorder of bone and cartilage, unspecified   . Other specified acquired hypothyroidism   . Unspecified hypothyroidism   . Anemia, unspecified   . Depressive disorder, not elsewhere classified   . Alzheimer's disease   . Paralysis agitans   . Pneumonia, organism unspecified   . Pseudomonas infection in conditions classified elsewhere and of unspecified site   . Unspecified essential hypertension   . Urinary tract infection, site not specified   . Osteoarthrosis, unspecified  whether generalized or localized, unspecified site   . Osteoporosis, unspecified   . Gout, unspecified   . Thoracic or lumbosacral neuritis or radiculitis, unspecified   . Stroke    Past Surgical History:  Past Surgical History  Procedure Laterality Date  . Esophagogastroduodenoscopy    . Eye surgery  2006    LEFT CATARACT   HPI:  81 yo male adm to [facility] with hematuria.  Pt with extensive PMH including PSP, Parkinsons, dysphagia, recurrent UTIs - BPH indewlling catheter, bladder spasms.  Swallow evaluation ordered to help family with strategies to decrease aspiration risk as son reported concern for pt aspirating.  .     Assessment / Plan / Recommendation Clinical Impression  Pt presents with symptoms of dysphagia consistent with findings from MBS conducted in August 2013.  He continues with signs of delayed oral transiting, piecemealing and delayed pharyngeal swallow.  Intermittent multiple swallows noted across consistencies, uncertain if due to piecemealing or residuals.    On previous MBS, pt had backflow of liquids from CP bar to pyriform sinus with inconsistent sensation - clearing with cued dry swalllows.  At this time, pt not follow directions to conduct dry swallows and this will increase his risk.  However he did not demonstrate clinical indications of aspiration.  Note CXR negative for acute findings.    To maximize pt's QOL/intake, recommend to advance diet to dys3/thin with strict precautions.   SLP to follow up to educate family to variability of swallow function and  ways to modify diet/adminstration,etc to maximize pt's airway protection and intake.  Do not recommend thickened drinks at this time as do not appear indicated.      Aspiration Risk  Moderate    Diet Recommendation Dysphagia 3 (Mechanical Soft);Thin liquid   Liquid Administration via: Cup;Straw Medication Administration: Whole meds with puree Supervision: Full supervision/cueing for compensatory  strategies Compensations: Small sips/bites;Slow rate;Check for pocketing (allow time for dry swallows, if pt coughs with intake - stop intake) Postural Changes and/or Swallow Maneuvers: Seated upright 90 degrees;Upright 30-60 min after meal    Other  Recommendations Oral Care Recommendations: Oral care BID   Follow Up Recommendations    TBD   Frequency and Duration min 1 x/week  1 week   Pertinent Vitals/Pain Low grade fever, decreased    SLP Swallow Goals  see care plan   Swallow Study Prior Functional Status   see Annetta Date of Onset: 01/11/15 HPI: 81 yo male adm to [facility] with hematuria.  Pt with extensive PMH including PSP, Parkinsons, dysphagia, recurrent UTIs - BPH indewlling catheter, bladder spasms.  Swallow evaluation ordered to help family with strategies to decrease aspiration risk as son reported concern for pt aspirating.  .   Type of Study: Bedside swallow evaluation Previous Swallow Assessment: MBS 2013 Diet Prior to this Study: Thin liquids;Dysphagia 1 (puree) Temperature Spikes Noted: No Respiratory Status: Room air History of Recent Intubation: No Behavior/Cognition: Alert;Confused;Distractible;Decreased sustained attention Oral Cavity - Dentition:  (missing some dentition, unable to fully assess as pt does not open mouth fully) Self-Feeding Abilities: Total assist Patient Positioning: Postural control interferes with function (pt is kyphotic with head flexed forward, ? accessory nerve impact) Baseline Vocal Quality: Clear Volitional Cough: Cognitively unable to elicit Volitional Swallow: Unable to elicit    Oral/Motor/Sensory Function Overall Oral Motor/Sensory Function:  (pt did not follow commands to complete but was leaning to left and had minimal left labial spillage )   Ice Chips Ice chips: Not tested   Thin Liquid Thin Liquid: Impaired Presentation: Cup;Spoon;Straw Oral Phase Impairments: Reduced lingual movement/coordination;Impaired anterior  to posterior transit Oral Phase Functional Implications: Prolonged oral transit;Left anterior spillage Pharyngeal  Phase Impairments: Suspected delayed Swallow;Other (comments);Multiple swallows (audible swallow) Other Comments: intermittent multiple swallows    Nectar Thick Nectar Thick Liquid: Impaired Presentation: Cup;Spoon;Straw Oral Phase Impairments: Reduced lingual movement/coordination;Impaired anterior to posterior transit Oral phase functional implications: Prolonged oral transit Pharyngeal Phase Impairments: Suspected delayed Swallow;Multiple swallows Other Comments: intermittent multiple swallows   Honey Thick Honey Thick Liquid: Not tested   Puree Puree: Impaired Presentation: Spoon Oral Phase Impairments: Reduced lingual movement/coordination Oral Phase Functional Implications: Prolonged oral transit Pharyngeal Phase Impairments: Suspected delayed Swallow;Multiple swallows Other Comments: intermittent multiple swallows   Solid   GO    Solid: Impaired Oral Phase Impairments: Reduced lingual movement/coordination;Impaired anterior to posterior transit Oral Phase Functional Implications: Other (comment) (mild delay in oral transiting) Pharyngeal Phase Impairments: Suspected delayed Elonda Husky, [facility] SLP [phone]

## 2015-01-11 NOTE — Progress Notes (Signed)
[facility] PROGRESS NOTE  Ricky [name] WYO:[id number] DOB: 12/17/1916 DOA: 01/10/2015 PCP: Hollace Kinnier, DO  Assessment/Plan: #1 gross hematuria May have been secondary to trauma. Per son patient was noted to have numerous clots and there was some difficulty exchanging the Foley catheter in the emergency room. Patient still with maroon color urine with occasional clots seen passing into the Foley bag. Bladder irrigation every shift. Family is very insistent on patient being assessed by urologist. Will consult with urology for further evaluation and management.  #2 urinary tract infection Urinalysis done on admission was turbid with positive nitrite large leukocytes large hemoglobin. May also be colonization. Patient also noted to have a gross hematuria and chronic indwelling Foley catheter. Urine cultures are pending. Patient is currently afebrile. Urine culture is pending. Will place empirically on IV Rocephin.  #3 chronic hyponatremia Likely secondary to volume depletion. Monitor closely with hydration. Follow.  #4 chronic progressive supranuclear palsy  Stable.  #5 hypertension Stable.  #6 dysphagia Patient has been seen by speech therapy and diet has been advanced appropriately.  #7 prophylaxis SCDs for DVT prophylaxis.   Code Status: DO NOT RESUSCITATE Family Communication: Updated son at bedside. Updated daughter via telephone. Disposition Plan: Home with family when medically stable.   Consultants:  Urology pending  Procedures:  Chest x-ray 01/10/2015    Antibiotics:  IV Rocephin 01/10/2015  HPI/Subjective: Patient sleeping. Patient arousable. Patient nonverbal.  Objective: Filed Vitals:   01/11/15 0920  BP: 142/63  Pulse: 70  Temp: 98.8 F (37.1 C)  Resp: 16    Intake/Output Summary (Last 24 hours) at 01/11/15 1345 Last data filed at 01/11/15 0903  Gross per 24 hour  Intake 434.17 ml  Output    700 ml  Net -265.83 ml   Filed  Weights   01/11/15 0610  Weight: 52.164 kg (115 lb)    Exam:   General:  NAD  Cardiovascular: RRR  Respiratory: CTAB anterior lung fields  Abdomen: Soft, nontender, nondistended, positive bowel sounds.  Musculoskeletal: No clubbing cyanosis or edema.  Data Reviewed: Basic Metabolic Panel:  Recent Labs Lab 01/10/15 1609 01/11/15 0530  NA 131* 129*  K 4.3 4.2  CL 97 97  CO2 26 25  GLUCOSE 86 84  BUN 14 11  CREATININE 0.68 0.67  CALCIUM 8.8 8.4  MG  --  1.9  PHOS  --  3.2   Liver Function Tests:  Recent Labs Lab 01/11/15 0530  AST 26  ALT 12  ALKPHOS 55  BILITOT 0.7  PROT 6.3  ALBUMIN 3.3*   No results for input(s): LIPASE, AMYLASE in the last 168 hours. No results for input(s): AMMONIA in the last 168 hours. CBC:  Recent Labs Lab 01/10/15 1609 01/11/15 01/11/15 0530 01/11/15 0901  WBC 4.9 7.4 6.9 6.6  NEUTROABS 3.1  --   --   --   HGB 10.4* 9.7* 9.9* 9.6*  HCT 31.8* 30.2* 29.8* 29.6*  MCV 88.8 89.1 89.0 89.7  PLT 178 200 190 203   Cardiac Enzymes: No results for input(s): CKTOTAL, CKMB, CKMBINDEX, TROPONINI in the last 168 hours. BNP (last 3 results) No results for input(s): BNP in the last 8760 hours.  ProBNP (last 3 results)  Recent Labs  03/21/14 0312  PROBNP 3769.0*    CBG: No results for input(s): GLUCAP in the last 168 hours.  No results found for this or any previous visit (from the past 240 hour(s)).   Studies: Dg Chest Port 1 View  01/10/2015  CLINICAL DATA:  Possible aspiration and airway. Patient is here for Foley catheter problems.  EXAM: PORTABLE CHEST - 1 VIEW  COMPARISON:  06/29/2014  FINDINGS: Diffuse emphysematous changes and fibrosis in the lungs. Nodular opacities in both lungs, measuring up to 11 mm diameter. These were not definitely present previously. Suggesting can't exclude pulmonary nodules. Additional calcified granulomas are present as before. No focal airspace disease or consolidation in the lungs. No  blunting of costophrenic angles. No pneumothorax. Calcified and tortuous aorta. Degenerative changes in the shoulders. Esophageal hiatal hernia behind the heart  IMPRESSION: Emphysematous changes and scattered fibrosis in the lungs. Suggestion of new pulmonary nodules bilaterally. CT suggested for further evaluation. No focal airspace disease or consolidation.   Electronically Signed   By: Lucienne Capers M.D.   On: 01/10/2015 19:12    Scheduled Meds: . aspirin EC  325 mg Oral QPM  . ferrous sulfate  325 mg Oral QODAY  . hydrALAZINE  12.5 mg Oral BID  . lactose free nutrition  90 mL Oral BID BM  . levothyroxine  25 mcg Oral QAC breakfast  . memantine  10 mg Oral BID  . psyllium  1 packet Oral Daily   Continuous Infusions:   Principal Problem:   Gross hematuria Active Problems:   ALZHEIMERS DISEASE   Hypertension   Dysphagia   Anemia, chronic disease   UTI (lower urinary tract infection)   Hyponatremia   Supranuclear palsies, progressive   Hematuria    Time spent: 42 minutes    THOMPSON,DANIEL M.D. [facility] Pager [phone]. If 7PM-7AM, please contact night-coverage at [email or web address], password [email or web address] 01/11/2015, 1:45 PM  LOS: 1 day

## 2015-01-11 NOTE — Telephone Encounter (Signed)
Urinary Catheter Anchoring Device order request received, order is to be reviewed, signed and dated.  Order was placed on ledge for Dr.Reed then to be faxed

## 2015-01-11 NOTE — Clinical Documentation Improvement (Signed)
A cause and effect relationship may not be assumed and must be documented by a provider.  Please clarify the relationship, if any, between chronic indwelling foley catheter and UTI.   Are the conditions: ? Due to or associated with each other ? Other (please specify)  ? Unrelated to each other ? Unable to determine ? Unknown      Risk Factors: Chronic indwelling catheter due to history of BPH with recurrent UTI, here with significant hematuria per 02/24 progress notes.    Thank you, Jeannetta Ellis ,RN Clinical Documentation Specialist:  [place] Information Management

## 2015-01-11 NOTE — Consult Note (Signed)
Urology Consult  CC: Referring physician: D. Grandville Silos Reason for referral: Gross hematuria  History of Present Illness: [name] is a 81 year old male patient of Dr. Janice Norrie who has outlet obstruction and has had an indwelling Foley catheter for some time. He had an episode of gross hematuria in 2014 for the first time. That cleared without any need for intervention and he subsequently has had intermittent episodes of pink tinged urine over time. It would not last for long. He is now been admitted to the hospital and had experienced recurrence of his gross hematuria. The patient has Alzheimer's and all of his history was obtained from his son.  His son indicated that he had difficulty with bladder spasms and leakage around the catheter in the past. This past Thursday he was found to have blood beneath him when he was moved from the bed. Hematuria was noted in the catheter tubing and it stayed red for longer than usual. Yesterday his son had noted decreased output through the catheter and home health was reluctant to change his catheter. His son then noted a large clot that passed around the catheter. The patient has otherwise not had any difficulty with his catheter or with catheter changes but apparently there was some difficulty with the catheter change in the emergency room earlier today. It sounds as if there was some encrustation on the catheter. He has a new catheter in now and it is draining. The patient does not appear to be in any distress.  Past Medical History  Diagnosis Date  . IBS (irritable bowel syndrome)   . Diverticulosis   . Cataract   . OAB (overactive bladder)   . Macular degeneration   . Alzheimer disease   . Hypertension   . Arthritis   . Osteoporosis   . Parkinsonian syndrome   . Syncope, vasovagal   . Neuromuscular disorder     parkisonian syndrome  . 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 Unspecified transient cerebral ischemia   .  Unspecified urinary incontinence   . Senile dementia, uncomplicated   . Unspecified constipation   . Hypothyroidism   . Anemia, unspecified   . Depression   . Alzheimer's disease   . Gout, unspecified   . Thoracic or lumbosacral neuritis or radiculitis, unspecified   . Progressive supranuclear palsy   . Unspecified transient cerebral ischemia   . Unspecified urinary incontinence   . Syncope and collapse   . Paralysis agitans   . Unspecified transient cerebral ischemia   . Pneumonitis due to inhalation of food or vomitus   . Cellulitis and abscess of oral soft tissues   . Screening for lipoid disorders   . Senile dementia, uncomplicated   . Unspecified constipation   . Disorder of bone and cartilage, unspecified   . Other specified acquired hypothyroidism   . Unspecified hypothyroidism   . Anemia, unspecified   . Depressive disorder, not elsewhere classified   . Alzheimer's disease   . Paralysis agitans   . Pneumonia, organism unspecified   . Pseudomonas infection in conditions classified elsewhere and of unspecified site   . Unspecified essential hypertension   . Urinary tract infection, site not specified   . Osteoarthrosis, unspecified whether generalized or localized, unspecified site   . Osteoporosis, unspecified   . Gout, unspecified   . Thoracic or lumbosacral neuritis or radiculitis, unspecified   . Stroke    Past Surgical History  Procedure Laterality Date  . Esophagogastroduodenoscopy    .  Eye surgery  2006    LEFT CATARACT    Medications:  Prior to Admission:  Prescriptions prior to admission  Medication Sig Dispense Refill Last Dose  . acetaminophen (TYLENOL) 500 MG tablet Take 1,000 mg by mouth 3 (three) times daily.    01/09/2015 at Unknown time  . AMBULATORY NON FORMULARY MEDICATION Pressure Air Mattress Dx: G60.8, L89.150 1 each 0 01/10/2015 at [place] time  . guaiFENesin (MUCINEX) 600 MG 12 hr tablet Take 600 mg by mouth 2 (two) times daily as needed  for cough.   01/09/2015 at Unknown time  . levothyroxine (SYNTHROID, LEVOTHROID) 25 MCG tablet TAKE 1 TABLET BY MOUTH DAILY 90 tablet 1 01/09/2015 at Unknown time  . memantine (NAMENDA) 10 MG tablet Take 10 mg by mouth 2 (two) times daily.   01/09/2015 at Unknown time  . Multiple Vitamins-Minerals (ICAPS AREDS FORMULA PO) Take 2 capsules by mouth 2 (two) times daily with a meal.    01/09/2015 at Unknown time  . Probiotic Product ([place]) CAPS Take 1 capsule by mouth 2 (two) times daily.   01/09/2015 at Unknown time  . psyllium (METAMUCIL) 58.6 % packet 2 tablespoons two times daily   01/09/2015 at Unknown time  . aspirin EC 325 MG tablet Take 325 mg by mouth every evening.   unknown at unknown time  . bacitracin ointment Apply 1 application topically 2 (two) times daily. Applies to head of penis to decrease infection from catheter.   unknown at Unknown time  . Calcium Carbonate-Vit D-Min (CALTRATE 600+D PLUS) 600-800 MG-UNIT CHEW Chew 1 tablet by mouth 2 (two) times daily with a meal.    unknown at Unknown time  . clotrimazole-betamethasone (LOTRISONE) cream Apply 1 application topically 2 (two) times daily as needed (Applies to body for rash.).    unknown at Unknown time  . CRANBERRY SOFT PO Take 2 capsules by mouth 3 (three) times daily.    unknown at Unknown time  . Cyanocobalamin (VITAMIN B-12) 5000 MCG SUBL Place 5,000 mcg under the tongue 2 (two) times daily.   unknown at Unknown time  . ferrous sulfate 325 (65 FE) MG tablet Take 325 mg by mouth every other day.    unknown at Unknown time  . hydrALAZINE (APRESOLINE) 25 MG tablet Take 1/2 tablet by mouth twice daily to control blood pressure 30 tablet 6 unknown at unknown time  . lactose free nutrition (BOOST) LIQD Take 90 mLs by mouth 3 (three) times daily between meals. (Patient taking differently: Take 90 mLs by mouth 2 (two) times daily between meals. )  0 unknown at unknown time  . lidocaine (LIDODERM) 5 % Place 3 patches onto the  skin daily. Remove & Discard patch within 12 hours or as directed by MD (Patient taking differently: Place 2 patches onto the skin daily. Remove & Discard patch within 12 hours or as directed by MD) 90 patch 3 unknown at unknown time  . Melatonin 5 MG TABS Take 5 mg by mouth at bedtime as needed (For sleep.).   unknown at Unknown time  . polyethylene glycol (MIRALAX / GLYCOLAX) packet Take 17 g by mouth daily as needed for mild constipation.   unknown at unknown time  . VOLTAREN 1 % GEL Apply 2 g topically 4 (four) times daily.    unknown at Unknown time   Scheduled: . aspirin EC  325 mg Oral QPM  . ferrous sulfate  325 mg Oral QODAY  . hydrALAZINE  12.5 mg Oral  BID  . lactose free nutrition  90 mL Oral BID BM  . levothyroxine  25 mcg Oral QAC breakfast  . memantine  10 mg Oral BID  . psyllium  1 packet Oral Daily   Continuous: . sodium chloride 50 mL/hr at 01/11/15 1709    Allergies:  Allergies  Allergen Reactions  . Other Other (See Comments)    Dairy products- cause swallowing difficulty     Family History  Problem Relation Age of Onset  . Cancer Mother   . Stroke Father   . Liver disease Father   . Kidney disease Brother   . Liver disease Brother   . Emphysema Brother     Social History:  reports that he has quit smoking. He has never used smokeless tobacco. He reports that he does not drink alcohol or use illicit drugs.  Review of Systems: Pertinent items are noted in HPI. A comprehensive review of systems was negative except as noted above. Obtaining ROS was not easily performed due to the patient's condition.  Physical Exam:  Vital signs in last 24 hours: Temp:  [98.2 F (36.8 C)-99 F (37.2 C)] 98.8 F (37.1 C) (02/25 0920) Pulse Rate:  [65-75] 70 (02/25 0920) Resp:  [16] 16 (02/25 0920) BP: (131-152)/(63-81) 142/63 mmHg (02/25 0920) SpO2:  [99 %-100 %] 99 % (02/25 0920) Weight:  [52.164 kg (115 lb)] 52.164 kg (115 lb) (02/25 0610) General appearance:  alert and appears stated age Head: Normocephalic, without obvious abnormality, atraumatic Eyes: conjunctivae/corneas clear. EOM's intact.  Oropharynx: moist mucous membranes Neck: supple, symmetrical, trachea midline Resp: normal respiratory effort Cardio: regular rate and rhythm Back: symmetric, no curvature. ROM normal. No CVA tenderness. GI: soft, non-tender; bowel sounds normal; no masses,  no organomegaly  Male genitalia: penis: normal male phallus with a Foley catheter indwelling. There was no blood around the catheter noted. The urine was pink without clots. It was draining freely. Testes: bilaterally descended with no masses or tenderness. no hernias  Extremities: extremities normal, atraumatic, no cyanosis or edema Skin: Skin color normal. No visible rashes or lesions Neurologic: Grossly normal  Laboratory Data:   Recent Labs  01/11/15 0530 01/11/15 0901 01/11/15 1614  WBC 6.9 6.6 5.0  HGB 9.9* 9.6* 10.1*  HCT 29.8* 29.6* 30.9*   BMET  Recent Labs  01/10/15 1609 01/11/15 0530  NA 131* 129*  K 4.3 4.2  CL 97 97  CO2 26 25  GLUCOSE 86 84  BUN 14 11  CREATININE 0.68 0.67  CALCIUM 8.8 8.4   No results for input(s): LABPT, INR in the last 72 hours. No results for input(s): LABURIN in the last 72 hours. Results for orders placed or performed during the hospital encounter of 06/28/14  MRSA PCR Screening     Status: None   Collection Time: 06/28/14 10:06 PM  Result Value Ref Range Status   MRSA by PCR NEGATIVE NEGATIVE Final    Comment:        The GeneXpert MRSA Assay (FDA approved for NASAL specimens only), is one component of a comprehensive MRSA colonization surveillance program. It is not intended to diagnose MRSA infection nor to guide or monitor treatment for MRSA infections.   Creatinine:  Recent Labs  01/10/15 1609 01/11/15 0530  CREATININE 0.68 0.67    Imaging: Dg Chest Port 1 View  01/10/2015   CLINICAL DATA:  Possible aspiration and  airway. Patient is here for Foley catheter problems.  EXAM: PORTABLE CHEST - 1 VIEW  COMPARISON:  06/29/2014  FINDINGS: Diffuse emphysematous changes and fibrosis in the lungs. Nodular opacities in both lungs, measuring up to 11 mm diameter. These were not definitely present previously. Suggesting can't exclude pulmonary nodules. Additional calcified granulomas are present as before. No focal airspace disease or consolidation in the lungs. No blunting of costophrenic angles. No pneumothorax. Calcified and tortuous aorta. Degenerative changes in the shoulders. Esophageal hiatal hernia behind the heart  IMPRESSION: Emphysematous changes and scattered fibrosis in the lungs. Suggestion of new pulmonary nodules bilaterally. CT suggested for further evaluation. No focal airspace disease or consolidation.   Electronically Signed   By: Lucienne Capers M.D.   On: 01/10/2015 19:12    Impression/Assessment:  Gross hematuria in a patient with chronic indwelling Foley catheter - he is not on any anticoagulants. There may have been some form of catheter trauma. There is probably also colonization and although a culture was performed it will have bacterial growth but this does not necessarily mean the patient has an active infection. With gross hematuria if it persists I would treat with antibiotics based on culture results however if his urine clears I don't know that antibiotic therapy would be indicated and very likely would contribute to selecting out for resistant organisms. I discussed with his son the fact that the intermittent gross hematuria that he has experienced previously and again now can occur occasionally and will not result in any significant drop in his hemoglobin. It appears his hemoglobin has remained stable. His urine is now clearing. If his catheter occludes again it may need to be irrigated but for right now he seems to be comfortable and no further evaluation or intervention is indicated.  Plan:    1. Continue Foley catheter. Irrigate PRN. 2. If his catheter appears to be coming encrusted it may require more frequent changes. 3. I have notified Dr. Janice Norrie of his admission.  Claybon Jabs 01/11/2015, 5:16 PM

## 2015-01-12 LAB — URINALYSIS, ROUTINE W REFLEX MICROSCOPIC
Bilirubin Urine: NEGATIVE
GLUCOSE, UA: NEGATIVE mg/dL
Ketones, ur: NEGATIVE mg/dL
NITRITE: NEGATIVE
PH: 7 (ref 5.0–8.0)
Protein, ur: NEGATIVE mg/dL
Specific Gravity, Urine: 1.012 (ref 1.005–1.030)
Urobilinogen, UA: 1 mg/dL (ref 0.0–1.0)

## 2015-01-12 LAB — URINE MICROSCOPIC-ADD ON

## 2015-01-12 LAB — CBC
HCT: 28.9 % — ABNORMAL LOW (ref 39.0–52.0)
HEMOGLOBIN: 9.5 g/dL — AB (ref 13.0–17.0)
MCH: 29.5 pg (ref 26.0–34.0)
MCHC: 32.9 g/dL (ref 30.0–36.0)
MCV: 89.8 fL (ref 78.0–100.0)
Platelets: 196 10*3/uL (ref 150–400)
RBC: 3.22 MIL/uL — ABNORMAL LOW (ref 4.22–5.81)
RDW: 12.7 % (ref 11.5–15.5)
WBC: 3.7 10*3/uL — AB (ref 4.0–10.5)

## 2015-01-12 LAB — BASIC METABOLIC PANEL
Anion gap: 9 (ref 5–15)
BUN: 9 mg/dL (ref 6–23)
CALCIUM: 8.6 mg/dL (ref 8.4–10.5)
CHLORIDE: 104 mmol/L (ref 96–112)
CO2: 24 mmol/L (ref 19–32)
Creatinine, Ser: 0.61 mg/dL (ref 0.50–1.35)
GFR calc Af Amer: >90 mL/min (ref >90)
GFR, EST NON AFRICAN AMERICAN: 82 mL/min — AB (ref >90)
Glucose, Bld: 86 mg/dL (ref 70–99)
Potassium: 3.7 mmol/L (ref 3.5–5.1)
SODIUM: 137 mmol/L (ref 135–145)

## 2015-01-12 MED ORDER — BOOST / RESOURCE BREEZE PO LIQD
1.0000 | Freq: Three times a day (TID) | ORAL | Status: DC
  Administered 2015-01-12 – 2015-01-14 (×6): 1 via ORAL

## 2015-01-12 NOTE — Progress Notes (Signed)
INITIAL NUTRITION ASSESSMENT  DOCUMENTATION CODES Per approved criteria  -Severe malnutrition in the context of chronic illness - Underweight  Pt meets criteria for severe MALNUTRITION in the context of chronic illness as evidenced by severe fat and muscle wasting and 28% wt loss in 6 months.  INTERVENTION: - Resource Breeze po TID, each supplement provides 250 kcal and 9 grams of protein - D/C Boost Plus - RD will continue to monitor  NUTRITION DIAGNOSIS: Inadequate oral intake related to chronic illness as evidenced by wt loss.   Goal: Pt to meet >/= 90% of their estimated nutrition needs   Monitor:  Weight trend, po intake, acceptance of supplements, labs  Reason for Assessment: low BMI  81 y.o. male  Admitting Dx: Gross hematuria  ASSESSMENT: 81 year old male patient of Dr. Janice Norrie who has outlet obstruction and has had an indwelling Foley catheter for some time. He had an episode of gross hematuria in 2014 for the first time. That cleared without any need for intervention and he subsequently has had intermittent episodes of pink tinged urine over time. It would not last for long. He is now been admitted to the hospital and had experienced recurrence of his gross hematuria.  - Pt with 45 lb wt loss since August 2015 per chart history. - Pt with coughing wen eating prior to admission. Seen by SLP who recommended Dysphagia III Diet with ground meats and extra gravy/sauce. Pt is tolerating diet currently. Eating lunch during RD visit (~50-75%). Per RN, pt does not do well with milk-like supplements due to causing build-up of phlgem. Will change to [employer].  - Labs reviewed  Nutrition Focused Physical Exam:  Subcutaneous Fat:  Orbital Region: severe depletion Upper Arm Region: severe depletion Thoracic and Lumbar Region: severe depletion  Muscle:  Temple Region: severe depletion Clavicle Bone Region: severe depletion Clavicle and Acromion Bone Region: severe  depletion Scapular Bone Region: severe depletion Dorsal Hand: severe depletion Patellar Region: severe depletion Anterior Thigh Region: severe depletion Posterior Calf Region: severe depletion  Edema: none  Height: Ht Readings from Last 1 Encounters:  01/11/15 5\' 10"  (1.778 m)    Weight: Wt Readings from Last 1 Encounters:  01/11/15 115 lb (52.164 kg)    Ideal Body Weight: 73 kg  % Ideal Body Weight: 72%  Wt Readings from Last 10 Encounters:  01/11/15 115 lb (52.164 kg)  11/22/14 115 lb (52.164 kg)  06/28/14 160 lb 3.3 oz (72.67 kg)  04/20/14 150 lb (68.04 kg)  03/31/14 141 lb 8 oz (64.184 kg)  03/29/14 158 lb (71.668 kg)  03/28/14 153 lb 11.2 oz (69.718 kg)  03/13/14 145 lb 15.1 oz (66.2 kg)  01/16/14 150 lb 2.1 oz (68.1 kg)  10/31/13 170 lb (77.111 kg)    Usual Body Weight: 160 lbs  % Usual Body Weight: 72%  BMI:  Body mass index is 16.5 kg/(m^2).  Estimated Nutritional Needs: Kcal: 1500-1700 Protein: 70-80 g Fluid: 1.7 L/day  Skin: unstageable pressure ulcer  Diet Order: DIET DYS 3  EDUCATION NEEDS: -Education needs addressed   Intake/Output Summary (Last 24 hours) at 01/12/15 1410 Last data filed at 01/12/15 1404  Gross per 24 hour  Intake  602.5 ml  Output   2200 ml  Net -1597.5 ml    Last BM: 2/26   Labs:   Recent Labs Lab 01/10/15 1609 01/11/15 0530 01/12/15 0554  NA 131* 129* 137  K 4.3 4.2 3.7  CL 97 97 104  CO2 26 25 24  BUN 14 11 9   CREATININE 0.68 0.67 0.61  CALCIUM 8.8 8.4 8.6  MG  --  1.9  --   PHOS  --  3.2  --   GLUCOSE 86 84 86    CBG (last 3)  No results for input(s): GLUCAP in the last 72 hours.  Scheduled Meds: . aspirin EC  325 mg Oral QPM  . cefTRIAXone (ROCEPHIN)  IV  1 g Intravenous Q24H  . ferrous sulfate  325 mg Oral QODAY  . hydrALAZINE  12.5 mg Oral BID  . lactose free nutrition  90 mL Oral BID BM  . levothyroxine  25 mcg Oral QAC breakfast  . memantine  10 mg Oral BID  . psyllium  1 packet  Oral Daily    Continuous Infusions:   Past Medical History  Diagnosis Date  . IBS (irritable bowel syndrome)   . Diverticulosis   . Cataract   . OAB (overactive bladder)   . Macular degeneration   . Alzheimer disease   . Hypertension   . Arthritis   . Osteoporosis   . Parkinsonian syndrome   . Syncope, vasovagal   . Neuromuscular disorder     parkisonian syndrome  . 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 Unspecified transient cerebral ischemia   . Unspecified urinary incontinence   . Senile dementia, uncomplicated   . Unspecified constipation   . Hypothyroidism   . Anemia, unspecified   . Depression   . Alzheimer's disease   . Gout, unspecified   . Thoracic or lumbosacral neuritis or radiculitis, unspecified   . Progressive supranuclear palsy   . Unspecified transient cerebral ischemia   . Unspecified urinary incontinence   . Syncope and collapse   . Paralysis agitans   . Unspecified transient cerebral ischemia   . Pneumonitis due to inhalation of food or vomitus   . Cellulitis and abscess of oral soft tissues   . Screening for lipoid disorders   . Senile dementia, uncomplicated   . Unspecified constipation   . Disorder of bone and cartilage, unspecified   . Other specified acquired hypothyroidism   . Unspecified hypothyroidism   . Anemia, unspecified   . Depressive disorder, not elsewhere classified   . Alzheimer's disease   . Paralysis agitans   . Pneumonia, organism unspecified   . Pseudomonas infection in conditions classified elsewhere and of unspecified site   . Unspecified essential hypertension   . Urinary tract infection, site not specified   . Osteoarthrosis, unspecified whether generalized or localized, unspecified site   . Osteoporosis, unspecified   . Gout, unspecified   . Thoracic or lumbosacral neuritis or radiculitis, unspecified   . Stroke     Past Surgical History  Procedure Laterality Date  .  Esophagogastroduodenoscopy    . Eye surgery  2006    LEFT CATARACT    Laurette Schimke [place], [place], [place]

## 2015-01-12 NOTE — Progress Notes (Signed)
SLP Cancellation Note  Patient Details Name: Ricky [name] MRN: [id number] DOB: 09-29-1917   Cancelled treatment:        Pt sleeping soundly and no family present for education regarding swallow precautions. Will continue efforts   Houston Siren 01/12/2015, 4:02 PM   Orbie Pyo Colvin Caroli.Ed [employer] [phone]

## 2015-01-12 NOTE — Progress Notes (Signed)
[facility] PROGRESS NOTE  Ricky [name] RCV:[id number] DOB: 05-11-1917 DOA: 01/10/2015 PCP: Hollace Kinnier, DO  Assessment/Plan: #1 gross hematuria May have been secondary to trauma. Per son patient was noted to have numerous clots and there was some difficulty exchanging the Foley catheter in the emergency room. Patient with clearing up with urine. Bladder irrigation when necessary. Patient has been seen by urology. Follow.   #2 probable urinary tract infection Urinalysis done on admission was turbid with positive nitrite large leukocytes large hemoglobin. May also be colonization. Patient also noted to have a gross hematuria and chronic indwelling Foley catheter. Urine cultures are pending. Patient is currently afebrile. Urine culture is pending. Continue IV Rocephin.  #3 chronic hyponatremia Likely secondary to volume depletion. Improved with hydration. Saline lock IV fluids. Follow.  #4 chronic progressive supranuclear palsy  Stable.  #5 hypertension Stable.  #6 dysphagia Patient has been seen by speech therapy and diet has been advanced appropriately.  #7 prophylaxis SCDs for DVT prophylaxis.   Code Status: DO NOT RESUSCITATE Family Communication: No family at bedside. Disposition Plan: Home with family when medically stable.   Consultants:  Urology: Dr. Karsten Ro 01/11/2015  Procedures:  Chest x-ray 01/10/2015    Antibiotics:  IV Rocephin 01/10/2015  HPI/Subjective: Patient arousable. Patient more verbal today. Patient with no complaints.  Objective: Filed Vitals:   01/12/15 1400  BP: 162/77  Pulse: 70  Temp: 98.6 F (37 C)  Resp: 15    Intake/Output Summary (Last 24 hours) at 01/12/15 1417 Last data filed at 01/12/15 1404  Gross per 24 hour  Intake  602.5 ml  Output   2200 ml  Net -1597.5 ml   Filed Weights   01/11/15 0610  Weight: 52.164 kg (115 lb)    Exam:   General:  NAD  Cardiovascular: RRR  Respiratory: CTAB anterior  lung fields  Abdomen: Soft, nontender, nondistended, positive bowel sounds.  Musculoskeletal: No clubbing cyanosis or edema. Upper extremities in contractures  Data Reviewed: Basic Metabolic Panel:  Recent Labs Lab 01/10/15 1609 01/11/15 0530 01/12/15 0554  NA 131* 129* 137  K 4.3 4.2 3.7  CL 97 97 104  CO2 26 25 24   GLUCOSE 86 84 86  BUN 14 11 9   CREATININE 0.68 0.67 0.61  CALCIUM 8.8 8.4 8.6  MG  --  1.9  --   PHOS  --  3.2  --    Liver Function Tests:  Recent Labs Lab 01/11/15 0530  AST 26  ALT 12  ALKPHOS 55  BILITOT 0.7  PROT 6.3  ALBUMIN 3.3*   No results for input(s): LIPASE, AMYLASE in the last 168 hours. No results for input(s): AMMONIA in the last 168 hours. CBC:  Recent Labs Lab 01/10/15 1609 01/11/15 01/11/15 0530 01/11/15 0901 01/11/15 1614 01/12/15 0554  WBC 4.9 7.4 6.9 6.6 5.0 3.7*  NEUTROABS 3.1  --   --   --   --   --   HGB 10.4* 9.7* 9.9* 9.6* 10.1* 9.5*  HCT 31.8* 30.2* 29.8* 29.6* 30.9* 28.9*  MCV 88.8 89.1 89.0 89.7 89.3 89.8  PLT 178 200 190 203 197 196   Cardiac Enzymes: No results for input(s): CKTOTAL, CKMB, CKMBINDEX, TROPONINI in the last 168 hours. BNP (last 3 results) No results for input(s): BNP in the last 8760 hours.  ProBNP (last 3 results)  Recent Labs  03/21/14 0312  PROBNP 3769.0*    CBG: No results for input(s): GLUCAP in the last 168 hours.  Recent Results (  from the past 240 hour(s))  Urine culture     Status: None (Preliminary result)   Collection Time: 01/10/15  5:28 PM  Result Value Ref Range Status   Specimen Description URINE, CATHETERIZED  Final   Special Requests NONE  Final   Colony Count PENDING  Incomplete   Culture   Final    Culture reincubated for better growth Performed at [facility]    Report Status PENDING  Incomplete     Studies: Dg Chest Port 1 View  01/10/2015   CLINICAL DATA:  Possible aspiration and airway. Patient is here for Foley catheter problems.  EXAM:  PORTABLE CHEST - 1 VIEW  COMPARISON:  06/29/2014  FINDINGS: Diffuse emphysematous changes and fibrosis in the lungs. Nodular opacities in both lungs, measuring up to 11 mm diameter. These were not definitely present previously. Suggesting can't exclude pulmonary nodules. Additional calcified granulomas are present as before. No focal airspace disease or consolidation in the lungs. No blunting of costophrenic angles. No pneumothorax. Calcified and tortuous aorta. Degenerative changes in the shoulders. Esophageal hiatal hernia behind the heart  IMPRESSION: Emphysematous changes and scattered fibrosis in the lungs. Suggestion of new pulmonary nodules bilaterally. CT suggested for further evaluation. No focal airspace disease or consolidation.   Electronically Signed   By: Lucienne Capers M.D.   On: 01/10/2015 19:12    Scheduled Meds: . aspirin EC  325 mg Oral QPM  . cefTRIAXone (ROCEPHIN)  IV  1 g Intravenous Q24H  . ferrous sulfate  325 mg Oral QODAY  . hydrALAZINE  12.5 mg Oral BID  . lactose free nutrition  90 mL Oral BID BM  . levothyroxine  25 mcg Oral QAC breakfast  . memantine  10 mg Oral BID  . psyllium  1 packet Oral Daily   Continuous Infusions:   Principal Problem:   Gross hematuria Active Problems:   ALZHEIMERS DISEASE   Hypertension   Dysphagia   Anemia, chronic disease   UTI (lower urinary tract infection)   Hyponatremia   Supranuclear palsies, progressive   Hematuria    Time spent: 38 minutes    Morty Ortwein M.D. [facility] Pager [phone]. If 7PM-7AM, please contact night-coverage at [email or web address], password [email or web address] 01/12/2015, 2:17 PM  LOS: 2 days

## 2015-01-12 NOTE — Evaluation (Signed)
Physical Therapy Evaluation Patient Details Name: Ricky [name] MRN: [id number] DOB: 03/20/1917 Today's Date: 01/12/2015   History of Present Illness  81 yo male admitted with UTI, hematuria. Hx of progressive supranuclear palsy with dementia, HTN, syncope, Parkinson's. Pt is from home with son.   Clinical Impression  Pt is total assist +2 for mobility. Pt did not follow commands or initiate any movement. Pt unable to participate with therapy. 1x eval. Recommend 24 hour supervision/assist at home as long as family remains able to provide care. If family is unable, then recommend SNF.      Follow Up Recommendations No PT follow up; 24 hour supervision/assist (SNF if family unable to continue care at home)    Equipment Recommendations  None recommended by PT    Recommendations for Other Services       Precautions / Restrictions Precautions Precautions: Fall Restrictions Weight Bearing Restrictions: No      Mobility  Bed Mobility Overal bed mobility: +2 for physical assistance;+ 2 for safety/equipment;Needs Assistance Bed Mobility: Supine to Sit;Sit to Supine     Supine to sit: Total assist;+2 for physical assistance;+2 for safety/equipment Sit to supine: Total assist;+2 for physical assistance;+2 for safety/equipment   General bed mobility comments: Total assist to get pt to EOB. Pt did not follow commands or initiate any movement. Utilzed bedpad for scooting, positioning.   Transfers                 General transfer comment: NT-pt unable  Ambulation/Gait             General Gait Details: NT-pt unable  Stairs            Wheelchair Mobility    Modified Rankin (Stroke Patients Only)       Balance Overall balance assessment: Needs assistance Sitting-balance support: Feet supported Sitting balance-Leahy Scale: Poor Sitting balance - Comments: sat EOB with Min-Min guard assist for ~5 minutes.                                       Pertinent Vitals/Pain Pain Assessment: Faces Faces Pain Scale: No hurt    Home Living Family/patient expects to be discharged to:: Private residence Living Arrangements: Children                    Prior Function           Comments: no family available to provide PLOF/home info     Hand Dominance        Extremity/Trunk Assessment   Upper Extremity Assessment: LUE deficits/detail;RUE deficits/detail;Difficult to assess due to impaired cognition RUE Deficits / Details: Pt resistant to attempts to fully extend elbow. Pt able to squeeze therapist's hand     LUE Deficits / Details: contracted L UE   Lower Extremity Assessment: LLE deficits/detail;RLE deficits/detail;Difficult to assess due to impaired cognition RLE Deficits / Details: Pt would/could not voluntarily move extremity LLE Deficits / Details: Pt would/could not voluntarily move extremity  Cervical / Trunk Assessment: Kyphotic (severely kyphotic cervical spine. )  Communication      Cognition Arousal/Alertness: Awake/alert Behavior During Therapy: WFL for tasks assessed/performed Overall Cognitive Status: No family/caregiver present to determine baseline cognitive functioning                      General Comments      Exercises  Assessment/Plan    PT Assessment Patent does not need any further PT services  PT Diagnosis     PT Problem List    PT Treatment Interventions     PT Goals (Current goals can be found in the Care Plan section) Acute Rehab PT Goals Patient Stated Goal: none stated PT Goal Formulation: All assessment and education complete, DC therapy    Frequency     Barriers to discharge        Co-evaluation               End of Session   Activity Tolerance: Patient tolerated treatment well Patient left: in bed;with call bell/phone within reach           Time: 1456-1510 PT Time Calculation (min) (ACUTE ONLY): 14 min   Charges:   PT  Evaluation $Initial PT Evaluation Tier I: 1 Procedure     PT G Codes:        Weston Anna, MPT Pager: [phone]

## 2015-01-13 LAB — BASIC METABOLIC PANEL
Anion gap: 8 (ref 5–15)
BUN: 10 mg/dL (ref 6–23)
CALCIUM: 8.4 mg/dL (ref 8.4–10.5)
CHLORIDE: 104 mmol/L (ref 96–112)
CO2: 24 mmol/L (ref 19–32)
Creatinine, Ser: 0.54 mg/dL (ref 0.50–1.35)
GFR calc non Af Amer: 86 mL/min — ABNORMAL LOW (ref >90)
Glucose, Bld: 71 mg/dL (ref 70–99)
Potassium: 3.4 mmol/L — ABNORMAL LOW (ref 3.5–5.1)
SODIUM: 136 mmol/L (ref 135–145)

## 2015-01-13 LAB — CBC
HEMATOCRIT: 27.8 % — AB (ref 39.0–52.0)
Hemoglobin: 9.2 g/dL — ABNORMAL LOW (ref 13.0–17.0)
MCH: 29.4 pg (ref 26.0–34.0)
MCHC: 33.1 g/dL (ref 30.0–36.0)
MCV: 88.8 fL (ref 78.0–100.0)
PLATELETS: 204 10*3/uL (ref 150–400)
RBC: 3.13 MIL/uL — ABNORMAL LOW (ref 4.22–5.81)
RDW: 12.8 % (ref 11.5–15.5)
WBC: 4.5 10*3/uL (ref 4.0–10.5)

## 2015-01-13 MED ORDER — POTASSIUM CHLORIDE CRYS ER 20 MEQ PO TBCR
40.0000 meq | EXTENDED_RELEASE_TABLET | Freq: Once | ORAL | Status: AC
  Administered 2015-01-13: 40 meq via ORAL
  Filled 2015-01-13: qty 2

## 2015-01-13 NOTE — Progress Notes (Signed)
Patient ID: Ricky [name], male   DOB: 22-Nov-1916, 81 y.o.   MRN: [id number] Afebrile. Foley draining well.   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 Urine culture: pending. OK for urologic discharge.  Exchange Foley catheter on a more frequent basis after discharge.

## 2015-01-13 NOTE — Progress Notes (Signed)
[facility] PROGRESS NOTE  SYLVIA KONDRACKI KVQ:[id number] DOB: [birth year] DOA: 01/10/2015 PCP: Hollace Kinnier, DO  Assessment/Plan: #1 gross hematuria May have been secondary to trauma. Per son patient was noted to have numerous clots and there was some difficulty exchanging the Foley catheter in the emergency room. Urine has cleared up. Bladder irrigation when necessary. Patient has been seen by urology. Outpatient follow up with urology. Follow.   #2 urinary tract infection Urinalysis done on admission was turbid with positive nitrite large leukocytes large hemoglobin. May also be colonization. Patient also noted to have a gross hematuria and chronic indwelling Foley catheter. Urine cultures with > 100,000 GNR. Sensitivities pending. Patient is currently afebrile. Urine culture is pending. Continue IV Rocephin.  #3 chronic hyponatremia Likely secondary to volume depletion. Improved with hydration. Saline lock IV fluids. Follow.  #4 chronic progressive supranuclear palsy  Stable.  #5 hypertension Stable.  #6 dysphagia Patient has been seen by speech therapy and diet has been advanced appropriately.  #7 prophylaxis SCDs for DVT prophylaxis.   Code Status: DO NOT RESUSCITATE Family Communication: No family at bedside. Disposition Plan: Home with family when medically stable.   Consultants:  Urology: Dr. Karsten Ro 01/11/2015  Procedures:  Chest x-ray 01/10/2015    Antibiotics:  IV Rocephin 01/10/2015  HPI/Subjective: Patient arousable. Patient alert and answering questions. Patient with no complaints.  Objective: Filed Vitals:   01/13/15 0630  BP: 132/68  Pulse: 52  Temp: 97.8 F (36.6 C)  Resp: 16    Intake/Output Summary (Last 24 hours) at 01/13/15 1201 Last data filed at 01/13/15 1100  Gross per 24 hour  Intake    610 ml  Output   1700 ml  Net  -1090 ml   Filed Weights   01/11/15 0610  Weight: 52.164 kg (115 lb)    Exam:   General:   NAD  Cardiovascular: RRR  Respiratory: CTAB anterior lung fields  Abdomen: Soft, nontender, nondistended, positive bowel sounds.  Musculoskeletal: No clubbing cyanosis or edema. Upper extremities in contractures  Data Reviewed: Basic Metabolic Panel:  Recent Labs Lab 01/10/15 1609 01/11/15 0530 01/12/15 0554 01/13/15 0526  NA 131* 129* 137 136  K 4.3 4.2 3.7 3.4*  CL 97 97 104 104  CO2 26 25 24 24   GLUCOSE 86 84 86 71  BUN 14 11 9 10   CREATININE 0.68 0.67 0.61 0.54  CALCIUM 8.8 8.4 8.6 8.4  MG  --  1.9  --   --   PHOS  --  3.2  --   --    Liver Function Tests:  Recent Labs Lab 01/11/15 0530  AST 26  ALT 12  ALKPHOS 55  BILITOT 0.7  PROT 6.3  ALBUMIN 3.3*   No results for input(s): LIPASE, AMYLASE in the last 168 hours. No results for input(s): AMMONIA in the last 168 hours. CBC:  Recent Labs Lab 01/10/15 1609  01/11/15 0530 01/11/15 0901 01/11/15 1614 01/12/15 0554 01/13/15 0526  WBC 4.9  < > 6.9 6.6 5.0 3.7* 4.5  NEUTROABS 3.1  --   --   --   --   --   --   HGB 10.4*  < > 9.9* 9.6* 10.1* 9.5* 9.2*  HCT 31.8*  < > 29.8* 29.6* 30.9* 28.9* 27.8*  MCV 88.8  < > 89.0 89.7 89.3 89.8 88.8  PLT 178  < > 190 203 197 196 204  < > = values in this interval not displayed. Cardiac Enzymes: No results for input(s):  CKTOTAL, CKMB, CKMBINDEX, TROPONINI in the last 168 hours. BNP (last 3 results) No results for input(s): BNP in the last 8760 hours.  ProBNP (last 3 results)  Recent Labs  03/21/14 0312  PROBNP 3769.0*    CBG: No results for input(s): GLUCAP in the last 168 hours.  Recent Results (from the past 240 hour(s))  Urine culture     Status: None (Preliminary result)   Collection Time: 01/10/15  5:28 PM  Result Value Ref Range Status   Specimen Description URINE, CATHETERIZED  Final   Special Requests NONE  Final   Colony Count   Final    >=100,000 COLONIES/ML Performed at [place] Performed at [facility]    Report Status PENDING  Incomplete     Studies: No results found.  Scheduled Meds: . aspirin EC  325 mg Oral QPM  . cefTRIAXone (ROCEPHIN)  IV  1 g Intravenous Q24H  . feeding supplement (RESOURCE BREEZE)  1 Container Oral TID BM  . ferrous sulfate  325 mg Oral QODAY  . hydrALAZINE  12.5 mg Oral BID  . levothyroxine  25 mcg Oral QAC breakfast  . memantine  10 mg Oral BID  . psyllium  1 packet Oral Daily   Continuous Infusions:   Principal Problem:   Gross hematuria Active Problems:   ALZHEIMERS DISEASE   Hypertension   Dysphagia   Anemia, chronic disease   UTI (lower urinary tract infection)   Hyponatremia   Supranuclear palsies, progressive   Hematuria    Time spent: 47 minutes    Eneida Evers M.D. [facility] Pager [phone]. If 7PM-7AM, please contact night-coverage at [email or web address], password [facility] 01/13/2015, 12:01 PM  LOS: 3 days

## 2015-01-14 DIAGNOSIS — N390 Urinary tract infection, site not specified: Secondary | ICD-10-CM

## 2015-01-14 DIAGNOSIS — B964 Proteus (mirabilis) (morganii) as the cause of diseases classified elsewhere: Secondary | ICD-10-CM

## 2015-01-14 LAB — BASIC METABOLIC PANEL
Anion gap: 10 (ref 5–15)
BUN: 17 mg/dL (ref 6–23)
CO2: 19 mmol/L (ref 19–32)
Calcium: 8.6 mg/dL (ref 8.4–10.5)
Chloride: 110 mmol/L (ref 96–112)
Creatinine, Ser: 0.69 mg/dL (ref 0.50–1.35)
GFR calc Af Amer: >90 mL/min (ref >90)
GFR, EST NON AFRICAN AMERICAN: 78 mL/min — AB (ref >90)
Glucose, Bld: 78 mg/dL (ref 70–99)
Potassium: 4.3 mmol/L (ref 3.5–5.1)
Sodium: 139 mmol/L (ref 135–145)

## 2015-01-14 LAB — URINE CULTURE: Colony Count: >=100000

## 2015-01-14 LAB — CBC
HCT: 29.1 % — ABNORMAL LOW (ref 39.0–52.0)
HEMOGLOBIN: 9.6 g/dL — AB (ref 13.0–17.0)
MCH: 29.1 pg (ref 26.0–34.0)
MCHC: 33 g/dL (ref 30.0–36.0)
MCV: 88.2 fL (ref 78.0–100.0)
PLATELETS: 217 10*3/uL (ref 150–400)
RBC: 3.3 MIL/uL — AB (ref 4.22–5.81)
RDW: 12.8 % (ref 11.5–15.5)
WBC: 5.9 10*3/uL (ref 4.0–10.5)

## 2015-01-14 MED ORDER — CEFUROXIME AXETIL 250 MG PO TABS: 250.0000 mg | ORAL_TABLET | Freq: Two times a day (BID) | ORAL | Status: DC

## 2015-01-14 MED ORDER — BISACODYL 10 MG RE SUPP
10.0000 mg | Freq: Once | RECTAL | Status: AC
  Administered 2015-01-14: 10 mg via RECTAL
  Filled 2015-01-14: qty 1

## 2015-01-14 MED ORDER — CEFUROXIME AXETIL 500 MG PO TABS
500.0000 mg | ORAL_TABLET | Freq: Two times a day (BID) | ORAL | Status: DC
  Administered 2015-01-14: 500 mg via ORAL
  Filled 2015-01-14 (×2): qty 1

## 2015-01-14 MED ORDER — BOOST / RESOURCE BREEZE PO LIQD: 1.0000 | Freq: Three times a day (TID) | ORAL | Status: DC

## 2015-01-14 NOTE — Progress Notes (Signed)
Pt discharged via wheelchair accompanied by son and nurse tech. Pt stable. DC instructions were given to son Rosie Fate

## 2015-01-14 NOTE — Evaluation (Signed)
Occupational Therapy Evaluation Patient Details Name: Ricky [name] MRN: [id number] DOB: 05-28-1917 Today's Date: 01/14/2015    History of Present Illness 81 yo male admitted with UTI, hematuria. Hx of progressive supranuclear palsy with dementia, HTN, syncope, Parkinson's. Pt is from home with son.    Clinical Impression   Pt requires +2 assist total for bed level mobility and total assist for all ADL.  Pt with flexed posturing of spine and extremities, no initiation or functional movement elicited.  Skin integrity is good in hands and elbows. Pt reports being bedbound at home, no family available to confirm, but clinical observation supports. No OT needs.    Follow Up Recommendations  No OT follow up;Supervision/Assistance - 24 hour (SNF if family unable to continue to care for pt)    Equipment Recommendations       Recommendations for Other Services       Precautions / Restrictions Precautions Precautions: Fall Restrictions Weight Bearing Restrictions: No      Mobility Bed Mobility Overal bed mobility: +2 for physical assistance;Needs Assistance Bed Mobility: Rolling (pulled up in bed) Rolling: +2 for physical assistance;Total assist         General bed mobility comments: +2 total assist to pull up in bed, no initiation of movement observed.  Transfers                 General transfer comment: pt unable to assist with transfers    Balance                                            ADL Overall ADL's : At baseline (Total assistance required)                                             Vision     Perception     Praxis      Pertinent Vitals/Pain Pain Assessment: No/denies pain     Hand Dominance     Extremity/Trunk Assessment Upper Extremity Assessment Upper Extremity Assessment: RUE deficits/detail;LUE deficits/detail RUE Deficits / Details: Increased flexor tone with attempts to perform AAROM/PROM  with closed hands.  Demonstrated ablility to open hands and reach for blankets. Skin integrity is good. RUE Coordination: decreased fine motor;decreased gross motor LUE Deficits / Details: Increased flexor tone with attempts to perform AAROM/PROM of elbow and shoulder, able to open fingers with minimal effort. Skin integrity is good. LUE Coordination: decreased fine motor;decreased gross motor   Lower Extremity Assessment Lower Extremity Assessment: Defer to PT evaluation   Cervical / Trunk Assessment Cervical / Trunk Assessment: Kyphotic;Other exceptions (fixed flexed cervical spine)   Communication     Cognition Arousal/Alertness: Awake/alert Behavior During Therapy: Flat affect Overall Cognitive Status: No family/caregiver present to determine baseline cognitive functioning (pt with baseline demential)                     General Comments       Exercises       Shoulder Instructions      Home Living Family/patient expects to be discharged to:: Private residence Living Arrangements: Children  Home Equipment: Hospital bed   Additional Comments: no family available, per pt he is hospital bed bound and dependent in all ADL which appears to be congruent with this evaluation.       Prior Functioning/Environment               OT Diagnosis:     OT Problem List:     OT Treatment/Interventions:      OT Goals(Current goals can be found in the care plan section) Acute Rehab OT Goals Patient Stated Goal: none stated  OT Frequency:     Barriers to D/C:            Co-evaluation              End of Session    Activity Tolerance: Patient tolerated treatment well Patient left: in bed;with call bell/phone within reach;with nursing/sitter in room   Time: 7408-1448 OT Time Calculation (min): 22 min Charges:  OT General Charges $OT Visit: 1 Procedure OT Evaluation $Initial OT Evaluation Tier I: 1 Procedure G-Codes:     Malka So 01/14/2015, 9:28 AM  [phone]

## 2015-01-14 NOTE — Progress Notes (Signed)
CARE MANAGEMENT NOTE 01/14/2015  Patient:  Ricky [name], Ricky [name]   Account Number:  [id number]  Date Initiated:  01/11/2015  Documentation initiated by:  Karl Bales  Subjective/Objective Assessment:   Pt admitted with UTI, Gross hematuria     Action/Plan:   from home   Anticipated DC Date:  01/14/2015   Anticipated DC Plan:  [place]  CM consult      Choice offered to / List presented to:             Status of service:  Completed, signed off Medicare Important Message given?  YES (If response is "NO", the following Medicare IM given date fields will be blank) Date Medicare IM given:  01/14/2015 Medicare IM given by:  [facility] Date Additional Medicare IM given:   Additional Medicare IM given by:    Discharge Disposition:  [place]  Per UR Regulation:  Reviewed for med. necessity/level of care/duration of stay  If discussed at [place] of Stay Meetings, dates discussed:    Comments:  01/14/2015 1640 NCM contacted son, Leonides Minder [phone]. Explained Medicare IM. States he does not feel pt is ready for dc. States he is waiting for sister to return this evening and they will make that decision. Son states pt is active with Nurse, learning disability for [facility] RN.  Notified [facility] of dc home today with HH. Jonnie Finner RN CCM Case Mgmt phone [phone]  01/11/15 MMcGibboney, RN, BSN Chart reviewed.

## 2015-01-14 NOTE — Discharge Summary (Signed)
Physician Discharge Summary  Ricky [name] RFF:[id number] DOB: August 14, 1917 DOA: 01/10/2015  PCP: Hollace Kinnier, DO  Admit date: 01/10/2015 Discharge date: 01/14/2015  Time spent: 60 minutes  Recommendations for Outpatient Follow-up:  1. Follow-up with Dr. Janice Norrie 2 weeks for follow-up on hospitalization for gross hematuria and UTI.  Discharge Diagnoses:  Principal Problem:   Gross hematuria Active Problems:   Urinary tract infection due to Proteus   ALZHEIMERS DISEASE   Hypertension   Dysphagia   Anemia, chronic disease   Hyponatremia   Supranuclear palsies, progressive   Hematuria   Discharge Condition: Stable and improved.  Diet recommendation: Dysphagia 3 diet.  Filed Weights   01/11/15 0610  Weight: 52.164 kg (115 lb)    History of present illness:  [name] Ricky [name] is a 81 y.o. male   has a past medical history of IBS (irritable bowel syndrome); Diverticulosis; Cataract; OAB (overactive bladder); Macular degeneration; Alzheimer disease; Hypertension; Arthritis; Osteoporosis; Parkinsonian syndrome; Syncope, vasovagal; Neuromuscular disorder; Dementia due to Parkinson's disease without behavioral disturbance; Unspecified transient cerebral ischemia; Unspecified urinary incontinence; Senile dementia, uncomplicated; Unspecified constipation; Hypothyroidism; Anemia, unspecified; Depression; Alzheimer's disease; Gout, unspecified; Thoracic or lumbosacral neuritis or radiculitis, unspecified; Progressive supranuclear palsy; Unspecified transient cerebral ischemia; Unspecified urinary incontinence; Syncope and collapse; Paralysis agitans; Unspecified transient cerebral ischemia; Pneumonitis due to inhalation of food or vomitus; Cellulitis and abscess of oral soft tissues; Screening for lipoid disorders; Senile dementia, uncomplicated; Unspecified constipation; Disorder of bone and cartilage, unspecified; Other specified acquired hypothyroidism; Unspecified  hypothyroidism; Anemia, unspecified; Depressive disorder, not elsewhere classified; Alzheimer's disease; Paralysis agitans; Pneumonia, organism unspecified; Pseudomonas infection in conditions classified elsewhere and of unspecified site; Unspecified essential hypertension; Urinary tract infection, site not specified; Osteoarthrosis, unspecified whether generalized or localized, unspecified site; Osteoporosis, unspecified; Gout, unspecified; Thoracic or lumbosacral neuritis or radiculitis, unspecified; and Stroke.   Presented with  Patient with supranuclear palsy lives at home with his son. He has chronic indwelling foley due to BPH since 2014. In the past he had recurrent UTI. He had recurrent blood in urine every month. Usually due to trauma. His son noted some blood in urine  on day of admission. Son reported that he has had some bladder spasms which is usual. On day of admission patient had less urine output. His nurse came to change catheter.  There was large amount of clots that came around the catheter. Patient was brought to ER. In ER they were able to exchange catheter and clear out calcifications. Urine showed large amount of WBC and nitrites. Family denies any fever. No significant confusion. Son felt he has been aspirating but unable to change diet due to patient not tolerating.   Hospitalist was called for admission for UTI  Hospital Course:  #1 gross hematuria Patient had presented on admission with gross hematuria with large amount of clots that came on around the catheter per son. In the ED they were able to exchange the catheter and clear out the calcifications. Urinalysis which was done was consistent with a UTI. Patient was started empirically on IV Rocephin. Foley catheter was flushed during the hospitalization with improvement in hematuria. At family's insistence a urology consultation was obtained and patient was seen in consultation by Dr. Karsten Ro on 01/11/2015. May have been  secondary to trauma. Per son patient was noted to have numerous clots and there was some difficulty exchanging the Foley catheter in the emergency room. Per urology it was felt gross hematuria may have likely been secondary  to catheter trauma. Patient's hemoglobin remained stable throughout the hospitalization with no significant drop. Bladder was irrigated with clearing of the urine and gross hematuria had resolved by day of discharge. Patient will follow-up with his urologist, Dr Janice Norrie as outpatient.   #2 Proteus mirabilis urinary tract infection Urinalysis done on admission was turbid with positive nitrite large leukocytes large hemoglobin. May also be colonization. Patient also noted to have a gross hematuria and chronic indwelling Foley catheter. Urine cultures with > 100,000 GNR. Urine cultures grew output is mirabilis urinary tract infection that was pansensitive. Patient on admission was placed on IV Rocephin. Patient was subsequently transitioned to oral Ceftin for 3 more days to complete a one-week course of antibiotic therapy. Patient will follow-up with his urologist as outpatient.   #3 chronic hyponatremia Likely secondary to volume depletion. Patient was placed on IV fluids with resolution of her hyponatremia.   #4 chronic progressive supranuclear palsy  Stable.  #5 hypertension Stable.  #6 dysphagia Patient has been seen by speech therapy and diet has been advanced appropriately, to dysphagia 3 diet.   Procedures:  Chest x-ray 01/10/2015  Consultations:  Urology: Dr. Karsten Ro 01/11/2015  Discharge Exam: Filed Vitals:   01/14/15 0700  BP: 150/87  Pulse: 56  Temp: 98.1 F (36.7 C)  Resp: 18    General: NAD Cardiovascular: RRR Respiratory: CTAB  Discharge Instructions   Discharge Instructions    Diet general    Complete by:  As directed   Dysphagia 3 diet     Discharge instructions    Complete by:  As directed   Follow up with Dr Janice Norrie in 2 weeks. Exchange  foley catheter more frequently.     Increase activity slowly    Complete by:  As directed           Current Discharge Medication List    START taking these medications   Details  cefUROXime (CEFTIN) 250 MG tablet Take 1 tablet (250 mg total) by mouth 2 (two) times daily with a meal. Take for 3 days then stop. Qty: 6 tablet, Refills: 0    !! feeding supplement, RESOURCE BREEZE, (RESOURCE BREEZE) LIQD Take 1 Container by mouth 3 (three) times daily between meals. Refills: 0     !! - Potential duplicate medications found. Please discuss with provider.    CONTINUE these medications which have NOT CHANGED   Details  acetaminophen (TYLENOL) 500 MG tablet Take 1,000 mg by mouth 3 (three) times daily.     AMBULATORY NON FORMULARY MEDICATION Pressure Air Mattress Dx: G60.8, L89.150 Qty: 1 each, Refills: 0    guaiFENesin (MUCINEX) 600 MG 12 hr tablet Take 600 mg by mouth 2 (two) times daily as needed for cough.    levothyroxine (SYNTHROID, LEVOTHROID) 25 MCG tablet TAKE 1 TABLET BY MOUTH DAILY Qty: 90 tablet, Refills: 1    memantine (NAMENDA) 10 MG tablet Take 10 mg by mouth 2 (two) times daily.    Multiple Vitamins-Minerals (ICAPS AREDS FORMULA PO) Take 2 capsules by mouth 2 (two) times daily with a meal.     Probiotic Product ([place]) CAPS Take 1 capsule by mouth 2 (two) times daily.    psyllium (METAMUCIL) 58.6 % packet 2 tablespoons two times daily    aspirin EC 325 MG tablet Take 325 mg by mouth every evening.    bacitracin ointment Apply 1 application topically 2 (two) times daily. Applies to head of penis to decrease infection from catheter.    Calcium Carbonate-Vit  D-Min (CALTRATE 600+D PLUS) 600-800 MG-UNIT CHEW Chew 1 tablet by mouth 2 (two) times daily with a meal.     clotrimazole-betamethasone (LOTRISONE) cream Apply 1 application topically 2 (two) times daily as needed (Applies to body for rash.).     CRANBERRY SOFT PO Take 2 capsules by mouth 3  (three) times daily.     Cyanocobalamin (VITAMIN B-12) 5000 MCG SUBL Place 5,000 mcg under the tongue 2 (two) times daily.    ferrous sulfate 325 (65 FE) MG tablet Take 325 mg by mouth every other day.     hydrALAZINE (APRESOLINE) 25 MG tablet Take 1/2 tablet by mouth twice daily to control blood pressure Qty: 30 tablet, Refills: 6    !! lactose free nutrition (BOOST) LIQD Take 90 mLs by mouth 3 (three) times daily between meals. Refills: 0    lidocaine (LIDODERM) 5 % Place 3 patches onto the skin daily. Remove & Discard patch within 12 hours or as directed by MD Qty: 90 patch, Refills: 3    Melatonin 5 MG TABS Take 5 mg by mouth at bedtime as needed (For sleep.).    polyethylene glycol (MIRALAX / GLYCOLAX) packet Take 17 g by mouth daily as needed for mild constipation.    VOLTAREN 1 % GEL Apply 2 g topically 4 (four) times daily.      !! - Potential duplicate medications found. Please discuss with provider.     Allergies  Allergen Reactions  . Other Other (See Comments)    Dairy products- cause swallowing difficulty    Follow-up Information    Follow up with Nesi, Charlene Brooke, MD. Schedule an appointment as soon as possible for a visit today.   Specialty:  Urology   Why:  f/u in 2 weeks   Contact information:   [address] [place] [address] [phone]        The results of significant diagnostics from this hospitalization (including imaging, microbiology, ancillary and laboratory) are listed below for reference.    Significant Diagnostic Studies: Dg Chest Port 1 View  01/10/2015   CLINICAL DATA:  Possible aspiration and airway. Patient is here for Foley catheter problems.  EXAM: PORTABLE CHEST - 1 VIEW  COMPARISON:  06/29/2014  FINDINGS: Diffuse emphysematous changes and fibrosis in the lungs. Nodular opacities in both lungs, measuring up to 11 mm diameter. These were not definitely present previously. Suggesting can't exclude pulmonary nodules. Additional calcified  granulomas are present as before. No focal airspace disease or consolidation in the lungs. No blunting of costophrenic angles. No pneumothorax. Calcified and tortuous aorta. Degenerative changes in the shoulders. Esophageal hiatal hernia behind the heart  IMPRESSION: Emphysematous changes and scattered fibrosis in the lungs. Suggestion of new pulmonary nodules bilaterally. CT suggested for further evaluation. No focal airspace disease or consolidation.   Electronically Signed   By: Lucienne Capers M.D.   On: 01/10/2015 19:12    Microbiology: Recent Results (from the past 240 hour(s))  Urine culture     Status: None   Collection Time: 01/10/15  5:28 PM  Result Value Ref Range Status   Specimen Description URINE, CATHETERIZED  Final   Special Requests NONE  Final   Colony Count   Final    >=100,000 COLONIES/ML Performed at [place] Performed at [facility]    Report Status 01/14/2015 FINAL  Final   Organism ID, Bacteria PROTEUS MIRABILIS  Final  Susceptibility   Proteus mirabilis - MIC*    AMPICILLIN <=2 SENSITIVE Sensitive     CEFAZOLIN <=4 SENSITIVE Sensitive     CEFTRIAXONE <=1 SENSITIVE Sensitive     CIPROFLOXACIN <=0.25 SENSITIVE Sensitive     GENTAMICIN <=1 SENSITIVE Sensitive     LEVOFLOXACIN <=0.12 SENSITIVE Sensitive     NITROFURANTOIN RESISTANT      TOBRAMYCIN <=1 SENSITIVE Sensitive     TRIMETH/SULFA <=20 SENSITIVE Sensitive     PIP/TAZO <=4 SENSITIVE Sensitive     * PROTEUS MIRABILIS  Culture, Urine     Status: None (Preliminary result)   Collection Time: 01/12/15  8:16 AM  Result Value Ref Range Status   Specimen Description URINE, CATHETERIZED  Final   Special Requests NONE  Final   Colony Count   Final    >=100,000 COLONIES/ML Performed at [facility]    Culture   Final    [place] Performed at [facility]    Report Status PENDING  Incomplete      Labs: Basic Metabolic Panel:  Recent Labs Lab 01/10/15 1609 01/11/15 0530 01/12/15 0554 01/13/15 0526 01/14/15 0618  NA 131* 129* 137 136 139  K 4.3 4.2 3.7 3.4* 4.3  CL 97 97 104 104 110  CO2 26 25 24 24 19   GLUCOSE 86 84 86 71 78  BUN 14 11 9 10 17   CREATININE 0.68 0.67 0.61 0.54 0.69  CALCIUM 8.8 8.4 8.6 8.4 8.6  MG  --  1.9  --   --   --   PHOS  --  3.2  --   --   --    Liver Function Tests:  Recent Labs Lab 01/11/15 0530  AST 26  ALT 12  ALKPHOS 55  BILITOT 0.7  PROT 6.3  ALBUMIN 3.3*   No results for input(s): LIPASE, AMYLASE in the last 168 hours. No results for input(s): AMMONIA in the last 168 hours. CBC:  Recent Labs Lab 01/10/15 1609  01/11/15 0901 01/11/15 1614 01/12/15 0554 01/13/15 0526 01/14/15 0618  WBC 4.9  < > 6.6 5.0 3.7* 4.5 5.9  NEUTROABS 3.1  --   --   --   --   --   --   HGB 10.4*  < > 9.6* 10.1* 9.5* 9.2* 9.6*  HCT 31.8*  < > 29.6* 30.9* 28.9* 27.8* 29.1*  MCV 88.8  < > 89.7 89.3 89.8 88.8 88.2  PLT 178  < > 203 197 196 204 217  < > = values in this interval not displayed. Cardiac Enzymes: No results for input(s): CKTOTAL, CKMB, CKMBINDEX, TROPONINI in the last 168 hours. BNP: BNP (last 3 results) No results for input(s): BNP in the last 8760 hours.  ProBNP (last 3 results)  Recent Labs  03/21/14 0312  PROBNP 3769.0*    CBG: No results for input(s): GLUCAP in the last 168 hours.     SignedIrine Seal MD [facility] 01/14/2015, 2:07 PM

## 2015-01-15 LAB — URINE CULTURE: Colony Count: >=100000

## 2015-01-19 ENCOUNTER — Ambulatory Visit (INDEPENDENT_AMBULATORY_CARE_PROVIDER_SITE_OTHER): Payer: 59 | Admitting: Internal Medicine

## 2015-01-19 ENCOUNTER — Encounter: Payer: Self-pay | Admitting: Internal Medicine

## 2015-01-19 VITALS — BP 148/80 | HR 58 | Temp 97.4°F | Resp 10

## 2015-01-19 DIAGNOSIS — R310 Gross hematuria: Secondary | ICD-10-CM | POA: Diagnosis not present

## 2015-01-19 DIAGNOSIS — R339 Retention of urine, unspecified: Secondary | ICD-10-CM | POA: Diagnosis not present

## 2015-01-19 DIAGNOSIS — G231 Progressive supranuclear ophthalmoplegia [Steele-Richardson-Olszewski]: Secondary | ICD-10-CM | POA: Diagnosis not present

## 2015-01-19 DIAGNOSIS — L89150 Pressure ulcer of sacral region, unstageable: Secondary | ICD-10-CM | POA: Diagnosis not present

## 2015-01-19 NOTE — Progress Notes (Signed)
Patient ID: Ricky [name], male   DOB: December 01, 1916, 81 y.o.   MRN: [id number]   Location:  [facility]  Code Status: DNR  Allergies  Allergen Reactions  . Other Other (See Comments)    Dairy products- cause swallowing difficulty     Chief Complaint  Patient presents with  . Hospitalization Follow-up    Seen in ER on 01/10/15 for gross hematuria and UTI. Completed Ceftin BID x 3 days    HPI: Patient is a 81 y.o. black male with PSP seen in the office today for a hospitalization f/u 01/10/15 with gross hematuria and "UTI" which was treated with ceftin bid for 3 days.  Urology has clearly felt he has colonization and would like to avoid treating him w/o overt symptoms like fever, abdominal pain.  8/15-2/16 w/o issues.  T  Has h/o bladder spasms with foley--used to tell his son about it.  Later they happened overnight and had incontinence.  Earlier this year, happened 3 days in row--did not need larger catheter, but had spasms.  10/22/13, hematuria time #1.  Happened  Various times since.  Not persistent. He thought bladder spasms and a little blood.   Then 2/22, 6pm, he had a bloody spot on his pad.  Got him up and cleaned him up 2x.   2/23, urine with blood all over bed, pjs and pad.  Seemed to be resolving, but then all over bed bloody urine.  Cathter was to be changed the next fri so called nurse to change early.  No more blood after shower--still in catheter bag but none leaking from penis.  Noon, no problem.  Picked him up to move him to bedroom and bloody spot on chair, blood clots were dripping slowly out of his penis (like whole cranberry sauce).  Went to hospital.  He's feeling fine the whole time.  Afebrile, normal bp, etc.  Two nurses tried to remove catheter and couldn't.  ED physician got out and had a lot of calcification at tip.  Had UTI again.    CXR done to r/o aspiration--was negative. Had rocephin IV 2 days, took off and put him on  orals and got 2 tabs ceftin bid for 3 days which finished wed (2 days ago).  Urine clear by 2/26.  No further blood.  No complaints the whole time by pt.  Had been lethargic until today.      2 wks ago skin around anus was dark, cracking and exfoliating, fresh tissue was being revealed.  Nurse contacted and they started to offload the area and use zinc oxide cream.  Has not changed in size.  His son checks it 2x per day.  He thinks it will be gone in a week.    Review of Systems:  Review of Systems  Unable to perform ROS: dementia     Past Medical History  Diagnosis Date  . IBS (irritable bowel syndrome)   . Diverticulosis   . Cataract   . OAB (overactive bladder)   . Macular degeneration   . Alzheimer disease   . Hypertension   . Arthritis   . Osteoporosis   . Parkinsonian syndrome   . Syncope, vasovagal   . Neuromuscular disorder     parkisonian syndrome  . 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 Unspecified transient cerebral ischemia   . Unspecified urinary incontinence   . Senile dementia, uncomplicated   . Unspecified constipation   .  Hypothyroidism   . Anemia, unspecified   . Depression   . Alzheimer's disease   . Gout, unspecified   . Thoracic or lumbosacral neuritis or radiculitis, unspecified   . Progressive supranuclear palsy   . Unspecified transient cerebral ischemia   . Unspecified urinary incontinence   . Syncope and collapse   . Paralysis agitans   . Unspecified transient cerebral ischemia   . Pneumonitis due to inhalation of food or vomitus   . Cellulitis and abscess of oral soft tissues   . Screening for lipoid disorders   . Senile dementia, uncomplicated   . Unspecified constipation   . Disorder of bone and cartilage, unspecified   . Other specified acquired hypothyroidism   . Unspecified hypothyroidism   . Anemia, unspecified   . Depressive disorder, not elsewhere classified   . Alzheimer's disease   . Paralysis  agitans   . Pneumonia, organism unspecified   . Pseudomonas infection in conditions classified elsewhere and of unspecified site   . Unspecified essential hypertension   . Urinary tract infection, site not specified   . Osteoarthrosis, unspecified whether generalized or localized, unspecified site   . Osteoporosis, unspecified   . Gout, unspecified   . Thoracic or lumbosacral neuritis or radiculitis, unspecified   . Stroke     Past Surgical History  Procedure Laterality Date  . Esophagogastroduodenoscopy    . Eye surgery  2006    LEFT CATARACT    Social History:   reports that he has quit smoking. He has never used smokeless tobacco. He reports that he does not drink alcohol or use illicit drugs.  Family History  Problem Relation Age of Onset  . Cancer Mother   . Stroke Father   . Liver disease Father   . Kidney disease Brother   . Liver disease Brother   . Emphysema Brother     Medications: Patient's Medications  New Prescriptions   No medications on file  Previous Medications   ACETAMINOPHEN (TYLENOL) 500 MG TABLET    Take 1,000 mg by mouth 3 (three) times daily.    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   BACITRACIN OINTMENT    Apply 1 application topically 2 (two) times daily. Applies to head of penis to decrease infection from catheter.   CALCIUM CARBONATE-VIT D-MIN (CALTRATE 600+D PLUS) 600-800 MG-UNIT CHEW    Chew 1 tablet by mouth 2 (two) times daily with a meal.    CEFUROXIME (CEFTIN) 250 MG TABLET    Take 1 tablet (250 mg total) by mouth 2 (two) times daily with a meal. Take for 3 days then stop.   CLOTRIMAZOLE-BETAMETHASONE (LOTRISONE) CREAM    Apply 1 application topically 2 (two) times daily as needed (Applies to body for rash.).    CRANBERRY SOFT PO    Take 2 capsules by mouth 3 (three) times daily.    CYANOCOBALAMIN (VITAMIN B-12) 5000 MCG SUBL     Place 5,000 mcg under the tongue 2 (two) times daily.   FEEDING SUPPLEMENT, RESOURCE BREEZE, (RESOURCE BREEZE) LIQD    Take 1 Container by mouth 3 (three) times daily between meals.   FERROUS SULFATE 325 (65 FE) MG TABLET    Take 325 mg by mouth every other day.    GUAIFENESIN (MUCINEX) 600 MG 12 HR TABLET    Take 600 mg by mouth 2 (two)  times daily as needed for cough.   HYDRALAZINE (APRESOLINE) 25 MG TABLET    Take 1/2 tablet by mouth twice daily to control blood pressure   LACTOSE FREE NUTRITION (BOOST) LIQD    Take 90 mLs by mouth 3 (three) times daily between meals.   LEVOTHYROXINE (SYNTHROID, LEVOTHROID) 25 MCG TABLET    TAKE 1 TABLET BY MOUTH DAILY   LIDOCAINE (LIDODERM) 5 %    Place 3 patches onto the skin daily. Remove & Discard patch within 12 hours or as directed by MD   MELATONIN 5 MG TABS    Take 5 mg by mouth at bedtime as needed (For sleep.).   MEMANTINE (NAMENDA) 10 MG TABLET    Take 10 mg by mouth 2 (two) times daily.   MULTIPLE VITAMINS-MINERALS (ICAPS AREDS FORMULA PO)    Take 2 capsules by mouth 2 (two) times daily with a meal.    POLYETHYLENE GLYCOL (MIRALAX / GLYCOLAX) PACKET    Take 17 g by mouth daily as needed for mild constipation.   PROBIOTIC PRODUCT ([place] COLON HEALTH) CAPS    Take 1 capsule by mouth 2 (two) times daily.   PSYLLIUM (METAMUCIL) 58.6 % PACKET    2 tablespoons two times daily   VOLTAREN 1 % GEL    Apply 2 g topically 4 (four) times daily.   Modified Medications   No medications on file  Discontinued Medications   No medications on file     Physical Exam: Filed Vitals:   01/19/15 1017  BP: 148/80  Pulse: 58  Temp: 97.4 F (36.3 C)  TempSrc: Oral  Resp: 10  SpO2: 97%  Physical Exam  Constitutional: No distress.  Cardiovascular: Normal rate, regular rhythm, normal heart sounds and intact distal pulses.   Pulmonary/Chest: Effort normal and breath sounds normal. No respiratory distress.  Abdominal: Soft. Bowel sounds are normal. He exhibits  no distension and no mass. There is no tenderness.  Neurological: He is alert.  Stayed awake for most of visit today; was responsive to questions though mostly said yes or no  Skin: Skin is warm and dry.  Psychiatric: He has a normal mood and affect.    Labs reviewed: Basic Metabolic Panel:  Recent Labs  03/21/14 0312  06/28/14 1210  01/11/15 0530 01/11/15 0710 01/12/15 0554 01/13/15 0526 01/14/15 0618  NA 137  < > 139  < > 129*  --  137 136 139  K 3.5*  < > 5.1  < > 4.2  --  3.7 3.4* 4.3  CL 99  < > 100  < > 97  --  104 104 110  CO2 25  < > 26  < > 25  --  24 24 19   GLUCOSE 107*  < > 115*  < > 84  --  86 71 78  BUN 9  < > 18  < > 11  --  9 10 17   CREATININE 0.80  < > 1.05  < > 0.67  --  0.61 0.54 0.69  CALCIUM 8.9  < > 9.7  < > 8.4  --  8.6 8.4 8.6  MG  --   --   --   --  1.9  --   --   --   --   PHOS  --   --   --   --  3.2  --   --   --   --   TSH 0.804  --  2.240  --   --  0.842  --   --   --   < > = values in this interval not displayed. Liver Function Tests:  Recent Labs  04/12/14 0915 06/28/14 1210 01/11/15 0530  AST 35 36 26  ALT 25 19 12   ALKPHOS 89 82 55  BILITOT 0.5 0.6 0.7  PROT 8.2 8.2 6.3  ALBUMIN 3.7 3.5 3.3*  CBC:  Recent Labs  04/13/14 1538 06/28/14 1132  01/10/15 1609  01/12/15 0554 01/13/15 0526 01/14/15 0618  WBC 8.3 8.4  < > 4.9  < > 3.7* 4.5 5.9  NEUTROABS 5.2 6.6  --  3.1  --   --   --   --   HGB 10.5* 11.9*  < > 10.4*  < > 9.5* 9.2* 9.6*  HCT 32.9* 37.1*  < > 31.8*  < > 28.9* 27.8* 29.1*  MCV 86 87.3  < > 88.8  < > 89.8 88.8 88.2  PLT 235 197  < > 178  < > 196 204 217  < > = values in this interval not displayed.  Lab Results  Component Value Date   HGBA1C 5.3 06/21/2012   Assessment/Plan 1. Hematuria, gross -has resolved -try to avoid UA c+s without McGeer criteria due to his colonization -was treated again for UTI   2. Progressive supranuclear ophthalmoplegia or palsy -having a good day today, has been fairly stable to  several months  3. Urinary retention with incomplete bladder emptying -continues with foley catheter draining dark yellow urine today  4. Decubitus ulcer of sacral region, unstageable -due to pressure and also edema of sacrum he develops in supine position--is just superior to his anus -continues on zinc oxide over the ulcer and frequent repositioning   Labs/tests ordered:  No new today--was eating crackers when I entered the room b/c he had c/o hunger upon arrival  Next appt:  3 mos  Durand Wittmeyer L. Mckinley Adelstein, D.O. [facility] [address], [place] [address] Cell Phone (Mon-Fri 8am-5pm):  [phone] On Call:  [phone] & follow prompts after 5pm & weekends Office Phone:  [phone] Office Fax:  [phone]

## 2015-01-23 DIAGNOSIS — R339 Retention of urine, unspecified: Secondary | ICD-10-CM | POA: Diagnosis not present

## 2015-01-23 DIAGNOSIS — Z466 Encounter for fitting and adjustment of urinary device: Secondary | ICD-10-CM | POA: Diagnosis not present

## 2015-01-25 ENCOUNTER — Telehealth: Payer: Self-pay | Admitting: *Deleted

## 2015-01-25 NOTE — Telephone Encounter (Signed)
Received fax from [facility] [phone] Fax: [phone] Supplies Ordered: Gauze Non-Impreg Nonsterile #[id number] Given to Dr. Mariea Clonts to review and sign.

## 2015-02-01 ENCOUNTER — Telehealth: Payer: Self-pay

## 2015-02-01 DIAGNOSIS — R2689 Other abnormalities of gait and mobility: Secondary | ICD-10-CM

## 2015-02-01 DIAGNOSIS — G231 Progressive supranuclear ophthalmoplegia [Steele-Richardson-Olszewski]: Secondary | ICD-10-CM

## 2015-02-01 NOTE — Telephone Encounter (Signed)
If this is slow and progressive, physical therapy might be helpful to help restore some function and strength. Provide physical therapy referral for the same. To call us/ notify if symptoms worsen

## 2015-02-01 NOTE — Telephone Encounter (Signed)
Message left on triage voicemail, patient with loss of mobility in hand, history of degenerative disease and needs recommendations   I called patient's son and left message on voicemail for him to call and give more specific details 1.) Which hand 2.) How long has patient been without mobility 3.) Any other symptoms

## 2015-02-01 NOTE — Telephone Encounter (Signed)
Left forearm and hand with decreased, gradually developing over time due to diagnosis of supranuclear palsy. X 3 months patient with difficulty holding a cup. Patient denies chest pain and discomfort. Patients son state this is a non-urgent concern

## 2015-02-06 NOTE — Telephone Encounter (Signed)
Spoke with patients son, Mr.Green agrees that physical therapy would be great. Patient is currently established with [facility].    I called [facility] @ [phone], order will be faxed to [phone]

## 2015-02-16 ENCOUNTER — Other Ambulatory Visit: Payer: Self-pay | Admitting: Internal Medicine

## 2015-02-26 ENCOUNTER — Telehealth: Payer: Self-pay | Admitting: *Deleted

## 2015-02-26 NOTE — Telephone Encounter (Signed)
Received fax order from [facility] #: [phone] Fax: [phone] for medical supplies Indwelling catheter foley Type 2 way Lat (P2244) #3, Irrigation Tray with bulb/piston syringe (A4320) #10, Bedside Drain bag Day/Night W/Wo Anti-reflex D (L7530) #5 Dx: R33.9 Given to Dr. Mariea Clonts to review and sign.

## 2015-03-05 ENCOUNTER — Telehealth: Payer: Self-pay | Admitting: *Deleted

## 2015-03-05 NOTE — Telephone Encounter (Signed)
Don with [facility] called and stated that patient needs a Rx to [facility] for Left Hand and Left Elbow Splint due to Contractures. Wants order faxed to [facility]. Please Advise.

## 2015-03-06 NOTE — Telephone Encounter (Signed)
[place] sent a Client Coordination Note Report for [facility] #: [phone] Fax: [phone] stating patient requires Left Hand and Left Elbow Splint due to contractures and rigidity left UE. Given to Dr. Mariea Clonts to review

## 2015-03-16 ENCOUNTER — Ambulatory Visit (INDEPENDENT_AMBULATORY_CARE_PROVIDER_SITE_OTHER): Payer: Medicare Other | Admitting: Internal Medicine

## 2015-03-16 ENCOUNTER — Encounter: Payer: Self-pay | Admitting: Internal Medicine

## 2015-03-16 VITALS — BP 116/68 | HR 64 | Temp 97.3°F | Resp 20 | Ht 70.0 in

## 2015-03-16 DIAGNOSIS — B029 Zoster without complications: Secondary | ICD-10-CM

## 2015-03-16 DIAGNOSIS — F0280 Dementia in other diseases classified elsewhere without behavioral disturbance: Secondary | ICD-10-CM

## 2015-03-16 DIAGNOSIS — G309 Alzheimer's disease, unspecified: Secondary | ICD-10-CM | POA: Diagnosis not present

## 2015-03-16 MED ORDER — VALACYCLOVIR HCL 1 G PO TABS: 1000.0000 mg | ORAL_TABLET | Freq: Three times a day (TID) | ORAL | Status: DC

## 2015-03-16 NOTE — Patient Instructions (Signed)
Wash hands frequently to prevent spread of infection  Take all of valtrex as prescribed  If muscle tightness does not resolve after completing valtrex, will consider muscle relaxer  Follow up as scheduled with Dr Mariea Clonts

## 2015-03-16 NOTE — Progress Notes (Signed)
Patient ID: Ricky [name], male   DOB: 09/07/1917, 81 y.o.   MRN: [id number]    Location:    [facility]   Place of Service:    OFFICE   Chief Complaint  Patient presents with  . Acute Visit    Possible shingles, Discuss labs (copy printed ) son thinks his father might need a mild muscle relaxer, has conerns of right hand swelling and left dangling wants to discuss getting a smaller wheel chair for travel    HPI:  81 yo male seen today for an acute visit. Son noticed blisters on right arm, axillae and back about 3 days ago and has worsened. No pain.  His right hand has swollen in last several days and is drroping more. LUE remains contracted.  Son requesting transport chair to assist with ADLs. He is currently carrying him into the bathroom  Pt is a poor historian due to dementia. Hx obtained from son and chart  Past Medical History  Diagnosis Date  . IBS (irritable bowel syndrome)   . Diverticulosis   . Cataract   . OAB (overactive bladder)   . Macular degeneration   . Alzheimer disease   . Hypertension   . Arthritis   . Osteoporosis   . Parkinsonian syndrome   . Syncope, vasovagal   . Neuromuscular disorder     parkisonian syndrome  . 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 Unspecified transient cerebral ischemia   . Unspecified urinary incontinence   . Senile dementia, uncomplicated   . Unspecified constipation   . Hypothyroidism   . Anemia, unspecified   . Depression   . Alzheimer's disease   . Gout, unspecified   . Thoracic or lumbosacral neuritis or radiculitis, unspecified   . Progressive supranuclear palsy   . Unspecified transient cerebral ischemia   . Unspecified urinary incontinence   . Syncope and collapse   . Paralysis agitans   . Unspecified transient cerebral ischemia   . Pneumonitis due to inhalation of food or vomitus   . Cellulitis and abscess of oral soft tissues   . Screening for lipoid disorders   . Senile  dementia, uncomplicated   . Unspecified constipation   . Disorder of bone and cartilage, unspecified   . Other specified acquired hypothyroidism   . Unspecified hypothyroidism   . Anemia, unspecified   . Depressive disorder, not elsewhere classified   . Alzheimer's disease   . Paralysis agitans   . Pneumonia, organism unspecified   . Pseudomonas infection in conditions classified elsewhere and of unspecified site   . Unspecified essential hypertension   . Urinary tract infection, site not specified   . Osteoarthrosis, unspecified whether generalized or localized, unspecified site   . Osteoporosis, unspecified   . Gout, unspecified   . Thoracic or lumbosacral neuritis or radiculitis, unspecified   . Stroke     Past Surgical History  Procedure Laterality Date  . Esophagogastroduodenoscopy    . Eye surgery  2006    LEFT CATARACT    Patient Care Team: Gayland Curry, DO as PCP - General (Geriatric Medicine)  History   Social History  . Marital Status: Single    Spouse Name: N/A  . Number of Children: N/A  . Years of Education: N/A   Occupational History  . Not on file.   Social History Main Topics  . Smoking status: Former Research scientist (life sciences)  . Smokeless tobacco: Never Used     Comment: QUIT SMOKING BACK IN  THE LATE 50'S  . Alcohol Use: No  . Drug Use: No  . Sexual Activity: No   Other Topics Concern  . Not on file   Social History Narrative     reports that he has quit smoking. He has never used smokeless tobacco. He reports that he does not drink alcohol or use illicit drugs.  Allergies  Allergen Reactions  . Other Other (See Comments)    Dairy products- cause swallowing difficulty     Medications: Patient's Medications  New Prescriptions   No medications on file  Previous Medications   ACETAMINOPHEN (TYLENOL) 500 MG TABLET    Take 1,000 mg by mouth 3 (three) times daily.    AMBULATORY NON FORMULARY MEDICATION    Pressure Air Mattress Dx: G60.8, L89.150    ASPIRIN EC 325 MG TABLET    Take 325 mg by mouth every evening.   BACITRACIN OINTMENT    Apply 1 application topically 2 (two) times daily. Applies to head of penis to decrease infection from catheter.   CALCIUM CARBONATE-VIT D-MIN (CALTRATE 600+D PLUS) 600-800 MG-UNIT CHEW    Chew 1 tablet by mouth 2 (two) times daily with a meal.    CLOTRIMAZOLE-BETAMETHASONE (LOTRISONE) CREAM    Apply 1 application topically 2 (two) times daily as needed (Applies to body for rash.).    CRANBERRY SOFT PO    Take 2 capsules by mouth 3 (three) times daily.    CYANOCOBALAMIN (VITAMIN B-12) 5000 MCG SUBL    Place 5,000 mcg under the tongue 2 (two) times daily.   FERROUS SULFATE 325 (65 FE) MG TABLET    Take 325 mg by mouth every other day.    GUAIFENESIN (MUCINEX) 600 MG 12 HR TABLET    Take 600 mg by mouth 2 (two) times daily as needed for cough.   HYDRALAZINE (APRESOLINE) 25 MG TABLET    Take 1/2 tablet by mouth twice daily to control blood pressure   LACTOSE FREE NUTRITION (BOOST) LIQD    Take 90 mLs by mouth 3 (three) times daily between meals.   LEVOTHYROXINE (SYNTHROID, LEVOTHROID) 25 MCG TABLET    TAKE 1 TABLET BY MOUTH DAILY   LIDOCAINE (LIDODERM) 5 %    PLACE 3 PATCHES ONTO THE SKIN DAILY. REMOVE & DISCARD PATCH WITHIN 12 HOURS OR AS DIRECTED BY MD   MELATONIN 5 MG TABS    Take 5 mg by mouth at bedtime as needed (For sleep.).   MEMANTINE (NAMENDA) 10 MG TABLET    Take 10 mg by mouth 2 (two) times daily.   MULTIPLE VITAMINS-MINERALS (ICAPS AREDS FORMULA PO)    Take 2 capsules by mouth 2 (two) times daily with a meal.    POLYETHYLENE GLYCOL (MIRALAX / GLYCOLAX) PACKET    Take 17 g by mouth daily as needed for mild constipation.   PROBIOTIC PRODUCT ([place] [facility] [facility]) CAPS    Take 1 capsule by mouth 2 (two) times daily.   PSYLLIUM (METAMUCIL) 58.6 % PACKET    2 tablespoons two times daily   VOLTAREN 1 % GEL    Apply 2 g topically 4 (four) times daily.   Modified Medications   No medications on file    Discontinued Medications   CEFUROXIME (CEFTIN) 250 MG TABLET    Take 1 tablet (250 mg total) by mouth 2 (two) times daily with a meal. Take for 3 days then stop.   FEEDING SUPPLEMENT, RESOURCE BREEZE, (RESOURCE BREEZE) LIQD    Take 1 Container by mouth  3 (three) times daily between meals.    Review of Systems  Unable to perform ROS: Dementia    Filed Vitals:   03/16/15 0956  BP: 116/68  Pulse: 64  Temp: 97.3 F (36.3 C)  TempSrc: Oral  Resp: 20  Height: 5\' 10"  (1.778 m)  SpO2: 96%   There is no weight on file to calculate BMI.  Physical Exam  Constitutional: He appears well-developed. No distress.  Frail appearing in NAD. Sitting in w/c. Son present  Musculoskeletal: He exhibits edema.       Right wrist: He exhibits swelling. He exhibits no deformity.  Contracted L>RUE  Neurological: He is alert.  Skin: Skin is warm, dry and intact. Rash noted. Rash is vesicular. There is erythema. No pallor.     No signs of secondary infection  Psychiatric: He has a normal mood and affect. His behavior is normal.     Labs reviewed: Admission on 01/10/2015, Discharged on 01/14/2015  No results displayed because visit has over 200 results.     CBC Latest Ref Rng 01/14/2015 01/13/2015 01/12/2015  WBC 4.0 - 10.5 K/uL 5.9 4.5 3.7(L)  Hemoglobin 13.0 - 17.0 g/dL 9.6(L) 9.2(L) 9.5(L)  Hematocrit 39.0 - 52.0 % 29.1(L) 27.8(L) 28.9(L)  Platelets 150 - 400 K/uL 217 204 196    CMP Latest Ref Rng 01/14/2015 01/13/2015 01/12/2015  Glucose 70 - 99 mg/dL 78 71 86  BUN 6 - 23 mg/dL 17 10 9   Creatinine 0.50 - 1.35 mg/dL 0.69 0.54 0.61  Sodium 135 - 145 mmol/L 139 136 137  Potassium 3.5 - 5.1 mmol/L 4.3 3.4(L) 3.7  Chloride 96 - 112 mmol/L 110 104 104  CO2 19 - 32 mmol/L 19 24 24   Calcium 8.4 - 10.5 mg/dL 8.6 8.4 8.6  Total Protein 6.0 - 8.3 g/dL - - -  Albumin 3.2 - 4.6 g/dL - - -  Total Bilirubin 0.3 - 1.2 mg/dL - - -  Alkaline Phos 39 - 117 U/L - - -  AST 0 - 37 U/L - - -  ALT 0 - 53 U/L  - - -      No results found.   Assessment/Plan   ICD-9-CM ICD-10-CM   1. Shingles 053.9 B02.9 valACYclovir (VALTREX) 1000 MG tablet  2. ALZHEIMERS DISEASE 331.0 G30.9     --Rx written for lightweight w/c to assist with ADLs in home. Son is currently carrying him into bathroom for bathing, toileting and grooming.  --good hand hygiene discussed  --contact isolation until blisters crust over  --keep appt with Dr Mariea Clonts as scheduled  Cordella Register. Perlie Gold  [facility] [address] [place], [place] [address] [phone] Cell (Monday-Friday 8 AM - 5 PM) [phone] After 5 PM and follow prompts

## 2015-03-24 DIAGNOSIS — R339 Retention of urine, unspecified: Secondary | ICD-10-CM | POA: Diagnosis not present

## 2015-03-24 DIAGNOSIS — M62422 Contracture of muscle, left upper arm: Secondary | ICD-10-CM | POA: Diagnosis not present

## 2015-03-24 DIAGNOSIS — M62421 Contracture of muscle, right upper arm: Secondary | ICD-10-CM | POA: Diagnosis not present

## 2015-03-24 DIAGNOSIS — Z466 Encounter for fitting and adjustment of urinary device: Secondary | ICD-10-CM | POA: Diagnosis not present

## 2015-03-25 ENCOUNTER — Inpatient Hospital Stay (HOSPITAL_COMMUNITY)
Admission: EM | Admit: 2015-03-25 | Discharge: 2015-03-30 | DRG: 689 | Disposition: A | Discharge status: Home / Self Care | Payer: Medicare Other | Attending: Internal Medicine | Admitting: Internal Medicine

## 2015-03-25 ENCOUNTER — Emergency Department (HOSPITAL_COMMUNITY): Payer: Medicare Other

## 2015-03-25 ENCOUNTER — Encounter (HOSPITAL_COMMUNITY): Payer: Self-pay | Admitting: *Deleted

## 2015-03-25 DIAGNOSIS — F0280 Dementia in other diseases classified elsewhere without behavioral disturbance: Secondary | ICD-10-CM | POA: Diagnosis present

## 2015-03-25 DIAGNOSIS — E860 Dehydration: Secondary | ICD-10-CM | POA: Diagnosis not present

## 2015-03-25 DIAGNOSIS — Z681 Body mass index (BMI) 19 or less, adult: Secondary | ICD-10-CM

## 2015-03-25 DIAGNOSIS — B029 Zoster without complications: Secondary | ICD-10-CM | POA: Diagnosis present

## 2015-03-25 DIAGNOSIS — F329 Major depressive disorder, single episode, unspecified: Secondary | ICD-10-CM | POA: Diagnosis present

## 2015-03-25 DIAGNOSIS — E43 Unspecified severe protein-calorie malnutrition: Secondary | ICD-10-CM | POA: Diagnosis present

## 2015-03-25 DIAGNOSIS — Z7401 Bed confinement status: Secondary | ICD-10-CM | POA: Diagnosis not present

## 2015-03-25 DIAGNOSIS — M109 Gout, unspecified: Secondary | ICD-10-CM | POA: Diagnosis present

## 2015-03-25 DIAGNOSIS — G309 Alzheimer's disease, unspecified: Secondary | ICD-10-CM | POA: Diagnosis present

## 2015-03-25 DIAGNOSIS — M1990 Unspecified osteoarthritis, unspecified site: Secondary | ICD-10-CM | POA: Diagnosis present

## 2015-03-25 DIAGNOSIS — E039 Hypothyroidism, unspecified: Secondary | ICD-10-CM | POA: Diagnosis present

## 2015-03-25 DIAGNOSIS — N390 Urinary tract infection, site not specified: Principal | ICD-10-CM | POA: Diagnosis present

## 2015-03-25 DIAGNOSIS — Z7982 Long term (current) use of aspirin: Secondary | ICD-10-CM | POA: Diagnosis not present

## 2015-03-25 DIAGNOSIS — G20 Parkinson's disease: Secondary | ICD-10-CM | POA: Diagnosis present

## 2015-03-25 DIAGNOSIS — R0602 Shortness of breath: Secondary | ICD-10-CM | POA: Insufficient documentation

## 2015-03-25 DIAGNOSIS — D649 Anemia, unspecified: Secondary | ICD-10-CM | POA: Diagnosis present

## 2015-03-25 DIAGNOSIS — M810 Age-related osteoporosis without current pathological fracture: Secondary | ICD-10-CM | POA: Diagnosis present

## 2015-03-25 DIAGNOSIS — Z91011 Allergy to milk products: Secondary | ICD-10-CM | POA: Diagnosis not present

## 2015-03-25 DIAGNOSIS — Z87891 Personal history of nicotine dependence: Secondary | ICD-10-CM

## 2015-03-25 DIAGNOSIS — Z8673 Personal history of transient ischemic attack (TIA), and cerebral infarction without residual deficits: Secondary | ICD-10-CM | POA: Diagnosis not present

## 2015-03-25 DIAGNOSIS — I10 Essential (primary) hypertension: Secondary | ICD-10-CM | POA: Diagnosis present

## 2015-03-25 DIAGNOSIS — F0391 Unspecified dementia with behavioral disturbance: Secondary | ICD-10-CM | POA: Diagnosis not present

## 2015-03-25 DIAGNOSIS — G9340 Encephalopathy, unspecified: Secondary | ICD-10-CM | POA: Diagnosis present

## 2015-03-25 DIAGNOSIS — K589 Irritable bowel syndrome without diarrhea: Secondary | ICD-10-CM | POA: Diagnosis present

## 2015-03-25 DIAGNOSIS — R4182 Altered mental status, unspecified: Secondary | ICD-10-CM | POA: Diagnosis not present

## 2015-03-25 DIAGNOSIS — B965 Pseudomonas (aeruginosa) (mallei) (pseudomallei) as the cause of diseases classified elsewhere: Secondary | ICD-10-CM | POA: Diagnosis present

## 2015-03-25 LAB — URINALYSIS, ROUTINE W REFLEX MICROSCOPIC
BILIRUBIN URINE: NEGATIVE
Glucose, UA: NEGATIVE mg/dL
KETONES UR: NEGATIVE mg/dL
Nitrite: NEGATIVE
Protein, ur: 100 mg/dL — AB
SPECIFIC GRAVITY, URINE: 1.021 (ref 1.005–1.030)
Urobilinogen, UA: 1 mg/dL (ref 0.0–1.0)
pH: 7.5 (ref 5.0–8.0)

## 2015-03-25 LAB — CBC WITH DIFFERENTIAL/PLATELET
Basophils Absolute: 0 10*3/uL (ref 0.0–0.1)
Basophils Relative: 0 % (ref 0–1)
Eosinophils Absolute: 0 10*3/uL (ref 0.0–0.7)
Eosinophils Relative: 0 % (ref 0–5)
HEMATOCRIT: 40.9 % (ref 39.0–52.0)
HEMOGLOBIN: 13 g/dL (ref 13.0–17.0)
LYMPHS ABS: 1.1 10*3/uL (ref 0.7–4.0)
Lymphocytes Relative: 14 % (ref 12–46)
MCH: 28.4 pg (ref 26.0–34.0)
MCHC: 31.8 g/dL (ref 30.0–36.0)
MCV: 89.5 fL (ref 78.0–100.0)
MONO ABS: 0.4 10*3/uL (ref 0.1–1.0)
MONOS PCT: 5 % (ref 3–12)
NEUTROS ABS: 6.2 10*3/uL (ref 1.7–7.7)
NEUTROS PCT: 81 % — AB (ref 43–77)
Platelets: 213 10*3/uL (ref 150–400)
RBC: 4.57 MIL/uL (ref 4.22–5.81)
RDW: 14 % (ref 11.5–15.5)
WBC: 7.7 10*3/uL (ref 4.0–10.5)

## 2015-03-25 LAB — BASIC METABOLIC PANEL
ANION GAP: 9 (ref 5–15)
BUN: 24 mg/dL — ABNORMAL HIGH (ref 6–20)
CALCIUM: 9.4 mg/dL (ref 8.9–10.3)
CO2: 27 mmol/L (ref 22–32)
Chloride: 101 mmol/L (ref 101–111)
Creatinine, Ser: 1.14 mg/dL (ref 0.61–1.24)
GFR calc Af Amer: >60 mL/min (ref >60)
GFR, EST NON AFRICAN AMERICAN: 53 mL/min — AB (ref >60)
Glucose, Bld: 100 mg/dL — ABNORMAL HIGH (ref 70–99)
Potassium: 4.2 mmol/L (ref 3.5–5.1)
Sodium: 137 mmol/L (ref 135–145)

## 2015-03-25 LAB — TSH: TSH: 1.228 u[IU]/mL (ref 0.350–4.500)

## 2015-03-25 LAB — URINE MICROSCOPIC-ADD ON

## 2015-03-25 MED ORDER — SODIUM CHLORIDE 0.9 % IV SOLN
INTRAVENOUS | Status: DC
  Administered 2015-03-26: 03:00:00 via INTRAVENOUS

## 2015-03-25 MED ORDER — POLYETHYLENE GLYCOL 3350 17 G PO PACK
17.0000 g | PACK | Freq: Every day | ORAL | Status: DC | PRN
  Administered 2015-03-30: 17 g via ORAL
  Filled 2015-03-25 (×2): qty 1

## 2015-03-25 MED ORDER — CALTRATE 600+D PLUS MINERALS 600-800 MG-UNIT PO CHEW: 1.0000 | CHEWABLE_TABLET | Freq: Two times a day (BID) | ORAL | Status: DC

## 2015-03-25 MED ORDER — SODIUM CHLORIDE 0.9 % IV BOLUS (SEPSIS)
500.0000 mL | Freq: Once | INTRAVENOUS | Status: AC
  Administered 2015-03-25: 500 mL via INTRAVENOUS

## 2015-03-25 MED ORDER — MEMANTINE HCL 10 MG PO TABS
10.0000 mg | ORAL_TABLET | Freq: Two times a day (BID) | ORAL | Status: DC
  Administered 2015-03-25 – 2015-03-30 (×10): 10 mg via ORAL
  Filled 2015-03-25 (×11): qty 1

## 2015-03-25 MED ORDER — VALACYCLOVIR HCL 500 MG PO TABS
1000.0000 mg | ORAL_TABLET | Freq: Three times a day (TID) | ORAL | Status: DC
  Filled 2015-03-25: qty 2

## 2015-03-25 MED ORDER — VALACYCLOVIR HCL 500 MG PO TABS
1000.0000 mg | ORAL_TABLET | Freq: Two times a day (BID) | ORAL | Status: DC
  Administered 2015-03-25 – 2015-03-30 (×10): 1000 mg via ORAL
  Filled 2015-03-25 (×11): qty 2

## 2015-03-25 MED ORDER — FERROUS SULFATE 325 (65 FE) MG PO TABS
325.0000 mg | ORAL_TABLET | ORAL | Status: DC
  Administered 2015-03-27 – 2015-03-29 (×2): 325 mg via ORAL
  Filled 2015-03-25 (×2): qty 1

## 2015-03-25 MED ORDER — SODIUM CHLORIDE 0.9 % IV SOLN
INTRAVENOUS | Status: DC
  Administered 2015-03-25: 16:00:00 via INTRAVENOUS

## 2015-03-25 MED ORDER — ASPIRIN EC 325 MG PO TBEC
325.0000 mg | DELAYED_RELEASE_TABLET | Freq: Every evening | ORAL | Status: DC
  Administered 2015-03-26 – 2015-03-29 (×3): 325 mg via ORAL
  Filled 2015-03-25 (×6): qty 1

## 2015-03-25 MED ORDER — HYDRALAZINE HCL 10 MG PO TABS
10.0000 mg | ORAL_TABLET | Freq: Three times a day (TID) | ORAL | Status: DC
  Administered 2015-03-25 – 2015-03-30 (×13): 10 mg via ORAL
  Filled 2015-03-25 (×17): qty 1

## 2015-03-25 MED ORDER — LEVOTHYROXINE SODIUM 25 MCG PO TABS
25.0000 ug | ORAL_TABLET | Freq: Every day | ORAL | Status: DC
  Administered 2015-03-26 – 2015-03-30 (×5): 25 ug via ORAL
  Filled 2015-03-25 (×6): qty 1

## 2015-03-25 MED ORDER — ENOXAPARIN SODIUM 40 MG/0.4ML ~~LOC~~ SOLN
40.0000 mg | SUBCUTANEOUS | Status: DC
  Administered 2015-03-25 – 2015-03-29 (×5): 40 mg via SUBCUTANEOUS
  Filled 2015-03-25 (×6): qty 0.4

## 2015-03-25 MED ORDER — CALCIUM CARBONATE-VITAMIN D 500-200 MG-UNIT PO TABS
1.0000 | ORAL_TABLET | Freq: Two times a day (BID) | ORAL | Status: DC
  Administered 2015-03-26 – 2015-03-30 (×8): 1 via ORAL
  Filled 2015-03-25 (×12): qty 1

## 2015-03-25 MED ORDER — DEXTROSE 5 % IV SOLN
1.0000 g | INTRAVENOUS | Status: DC
  Administered 2015-03-25 – 2015-03-26 (×2): 1 g via INTRAVENOUS
  Filled 2015-03-25 (×3): qty 10

## 2015-03-25 MED ORDER — BOOST PO LIQD
90.0000 mL | Freq: Three times a day (TID) | ORAL | Status: DC
  Administered 2015-03-26 – 2015-03-30 (×14): 90 mL via ORAL
  Filled 2015-03-25 (×16): qty 237

## 2015-03-25 MED ORDER — GUAIFENESIN ER 600 MG PO TB12: 600.0000 mg | ORAL_TABLET | Freq: Two times a day (BID) | ORAL | Status: DC | PRN

## 2015-03-25 MED ORDER — CEFTRIAXONE SODIUM 1 G IJ SOLR: 1.0000 g | INTRAMUSCULAR | Status: DC

## 2015-03-25 MED ORDER — ACETAMINOPHEN 500 MG PO TABS
1000.0000 mg | ORAL_TABLET | Freq: Three times a day (TID) | ORAL | Status: DC
  Administered 2015-03-26 – 2015-03-30 (×15): 1000 mg via ORAL
  Filled 2015-03-25 (×20): qty 2

## 2015-03-25 MED ORDER — CLOTRIMAZOLE-BETAMETHASONE 1-0.05 % EX CREA: 1.0000 "application " | TOPICAL_CREAM | Freq: Two times a day (BID) | CUTANEOUS | Status: DC | PRN

## 2015-03-25 NOTE — ED Notes (Signed)
Upon admission to ED.Patient appears lethargic, alert but not responsive to questions. Foley catheter in place. Skin thin all over and dressing to sacral area

## 2015-03-25 NOTE — ED Notes (Signed)
Unable to achieve IV access x 2.

## 2015-03-25 NOTE — ED Notes (Addendum)
Per [facility] they were contacted by patients family from his home. Pt had syncopal episode. Pt has history of syncopal episodes but family stated this was the longest episode. Pt's is alert but unable to respond to questions. Family reported CVA in the past. Per [facility] family reports this is pts baseline

## 2015-03-25 NOTE — ED Provider Notes (Signed)
CSN: [id number]     Arrival date & time 03/25/15  1313 History   First MD Initiated Contact with Patient 03/25/15 1347     Chief Complaint  Patient presents with  . Loss of Consciousness     (Consider location/radiation/quality/duration/timing/severity/associated sxs/prior Treatment) HPI Comments: Patient here after having a syncopal event home prior to arrival. For the last 4-5 days patient has not been eating or drinking. No reported vomiting or fever or diarrhea. Patient does have baseline history of dementia. He is not been as responsive. No recent history of trauma. Symptoms persistent and nothing makes them better or worse. No treatment used prior to arrival  Patient is a 81 y.o. male presenting with syncope. The history is provided by a relative and a caregiver.  Loss of Consciousness   Past Medical History  Diagnosis Date  . IBS (irritable bowel syndrome)   . Diverticulosis   . Cataract   . OAB (overactive bladder)   . Macular degeneration   . Alzheimer disease   . Hypertension   . Arthritis   . Osteoporosis   . Parkinsonian syndrome   . Syncope, vasovagal   . Neuromuscular disorder     parkisonian syndrome  . 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 Unspecified transient cerebral ischemia   . Unspecified urinary incontinence   . Senile dementia, uncomplicated   . Unspecified constipation   . Hypothyroidism   . Anemia, unspecified   . Depression   . Alzheimer's disease   . Gout, unspecified   . Thoracic or lumbosacral neuritis or radiculitis, unspecified   . Progressive supranuclear palsy   . Unspecified transient cerebral ischemia   . Unspecified urinary incontinence   . Syncope and collapse   . Paralysis agitans   . Unspecified transient cerebral ischemia   . Pneumonitis due to inhalation of food or vomitus   . Cellulitis and abscess of oral soft tissues   . Screening for lipoid disorders   . Senile dementia, uncomplicated   .  Unspecified constipation   . Disorder of bone and cartilage, unspecified   . Other specified acquired hypothyroidism   . Unspecified hypothyroidism   . Anemia, unspecified   . Depressive disorder, not elsewhere classified   . Alzheimer's disease   . Paralysis agitans   . Pneumonia, organism unspecified   . Pseudomonas infection in conditions classified elsewhere and of unspecified site   . Unspecified essential hypertension   . Urinary tract infection, site not specified   . Osteoarthrosis, unspecified whether generalized or localized, unspecified site   . Osteoporosis, unspecified   . Gout, unspecified   . Thoracic or lumbosacral neuritis or radiculitis, unspecified   . Stroke    Past Surgical History  Procedure Laterality Date  . Esophagogastroduodenoscopy    . Eye surgery  2006    LEFT CATARACT   Family History  Problem Relation Age of Onset  . Cancer Mother   . Stroke Father   . Liver disease Father   . Kidney disease Brother   . Liver disease Brother   . Emphysema Brother    History  Substance Use Topics  . Smoking status: Former Research scientist (life sciences)  . Smokeless tobacco: Never Used     Comment: QUIT SMOKING BACK IN THE LATE 50'S  . Alcohol Use: No    Review of Systems  Cardiovascular: Positive for syncope.  All other systems reviewed and are negative.     Allergies  Other  Home Medications  Prior to Admission medications   Medication Sig Start Date End Date Taking? Authorizing Provider  AMBULATORY NON FORMULARY MEDICATION Pressure Air Mattress Dx: G60.8, L89.150 10/30/14  Yes Tiffany L Reed, DO  acetaminophen (TYLENOL) 500 MG tablet Take 1,000 mg by mouth 3 (three) times daily.     Historical Provider, MD  aspirin EC 325 MG tablet Take 325 mg by mouth every evening.    Historical Provider, MD  bacitracin ointment Apply 1 application topically 2 (two) times daily. Applies to head of penis to decrease infection from catheter.    Historical Provider, MD  Calcium  Carbonate-Vit D-Min (CALTRATE 600+D PLUS) 600-800 MG-UNIT CHEW Chew 1 tablet by mouth 2 (two) times daily with a meal.     Historical Provider, MD  clotrimazole-betamethasone (LOTRISONE) cream Apply 1 application topically 2 (two) times daily as needed (Applies to body for rash.).  02/01/14   Historical Provider, MD  CRANBERRY SOFT PO Take 2 capsules by mouth 3 (three) times daily.     Historical Provider, MD  Cyanocobalamin (VITAMIN B-12) 5000 MCG SUBL Place 5,000 mcg under the tongue 2 (two) times daily.    Historical Provider, MD  ferrous sulfate 325 (65 FE) MG tablet Take 325 mg by mouth every other day.     Historical Provider, MD  guaiFENesin (MUCINEX) 600 MG 12 hr tablet Take 600 mg by mouth 2 (two) times daily as needed for cough.    Historical Provider, MD  hydrALAZINE (APRESOLINE) 25 MG tablet Take 1/2 tablet by mouth twice daily to control blood pressure 11/14/14   Blanchie Serve, MD  lactose free nutrition (BOOST) LIQD Take 90 mLs by mouth 3 (three) times daily between meals. Patient taking differently: Take 90 mLs by mouth 2 (two) times daily between meals.  07/01/14   Barton Dubois, MD  levothyroxine (SYNTHROID, LEVOTHROID) 25 MCG tablet TAKE 1 TABLET BY MOUTH DAILY 10/19/14   Tiffany L Reed, DO  lidocaine (LIDODERM) 5 % PLACE 3 PATCHES ONTO THE SKIN DAILY. REMOVE & DISCARD PATCH WITHIN 12 HOURS OR AS DIRECTED BY MD 02/16/15   Gayland Curry, DO  Melatonin 5 MG TABS Take 5 mg by mouth at bedtime as needed (For sleep.).    Historical Provider, MD  memantine (NAMENDA) 10 MG tablet Take 10 mg by mouth 2 (two) times daily.    Historical Provider, MD  Multiple Vitamins-Minerals (ICAPS AREDS FORMULA PO) Take 2 capsules by mouth 2 (two) times daily with a meal.     Historical Provider, MD  polyethylene glycol (MIRALAX / GLYCOLAX) packet Take 17 g by mouth daily as needed for mild constipation. 07/01/14   Barton Dubois, MD  Probiotic Product ([place]) CAPS Take 1 capsule by mouth 2 (two)  times daily.    Historical Provider, MD  psyllium (METAMUCIL) 58.6 % packet 2 tablespoons two times daily    Historical Provider, MD  valACYclovir (VALTREX) 1000 MG tablet Take 1 tablet (1,000 mg total) by mouth 3 (three) times daily. 03/16/15   Monica Carter, DO  VOLTAREN 1 % GEL Apply 2 g topically 4 (four) times daily.  07/10/14   Historical Provider, MD   BP 134/73 mmHg  Pulse 55  Temp(Src) 98.3 F (36.8 C) (Rectal)  Resp 16  SpO2 96% Physical Exam  Constitutional: He appears lethargic. He appears cachectic. He has a sickly appearance. He appears ill.  HENT:  Head: Normocephalic and atraumatic.  Mouth/Throat: Mucous membranes are dry.  Eyes: Conjunctivae, EOM and lids are normal. Pupils  are equal, round, and reactive to light.  Neck: Normal range of motion. Neck supple. No tracheal deviation present. No thyroid mass present.  Cardiovascular: Normal rate, regular rhythm and normal heart sounds.  Exam reveals no gallop.   No murmur heard. Pulmonary/Chest: Effort normal and breath sounds normal. No stridor. No respiratory distress. He has no decreased breath sounds. He has no wheezes. He has no rhonchi. He has no rales.  Abdominal: Soft. Normal appearance and bowel sounds are normal. He exhibits no distension. There is no tenderness. There is no rebound and no CVA tenderness.  Musculoskeletal: Normal range of motion. He exhibits no edema or tenderness.  Neurological: He appears lethargic. No cranial nerve deficit or sensory deficit. GCS eye subscore is 4. GCS verbal subscore is 5. GCS motor subscore is 6.  Skin: Skin is warm and dry. No abrasion and no rash noted.  Nursing note and vitals reviewed.   ED Course  Procedures (including critical care time) Labs Review Labs Reviewed  URINE CULTURE  CBC WITH DIFFERENTIAL/PLATELET  BASIC METABOLIC PANEL  URINALYSIS, ROUTINE W REFLEX MICROSCOPIC    Imaging Review No results found.   EKG Interpretation None      MDM   Final  diagnoses:  None     Pt given iv fluids and started on abx , will be admitted  Lacretia Leigh, MD 03/25/15 1551

## 2015-03-25 NOTE — H&P (Addendum)
[facility] History and Physical  CRIAG WICKLUND VOZ:[id number] DOB: Mar 30, 1917 DOA: 03/25/2015  Referring physician: Vivi Martens, MD PCP: Hollace Kinnier, DO   Chief Complaint: weakness and altered mental status   HPI:  Pt is 81 yo male with multiple medical conditions including alzheimer's dementia, HTN, parkinsonian syndrome, hypothyroidism, presented to [facility] ED with main concern of several days duration of progressive failure to thrive, poor oral intake, mostly bed bound. Please note that pt unable to provide any history at the time of the admission and family at bedside assisted with information. Son reported pt has has done fairly well over the past year, was recently treated for shingles outbreak on his right shoulder and chest area, was treated with valtrex 500 mg BID for 7 days and son reported that the rash is still rather bad. No reported fevers, chills, no specific abd concerns noted. Son did report foul smelling urine and dark color. No reported chest pain or shortness of breath.   In ED, pt noted to be hemodynamically stable, VSS, blood work notable for BUN 24, otherwise unremarkable. UA suggestive of UTI, pt started on Rocephin by ED doctor and [facility] asked to admit to medical unit for further evaluation.   Assessment and Plan: Active Problems:   Acute encephalopathy  - appears to be multifactorial and secondary to progressive FTT, dehydration from poor oral intake, UTI, ? Shingles  - will admit to medical unit - will continue rocephin that was started in ED, follow up on urine cultures - will also provide IVF as pt looks clinically dehydrated - upper extremities contracted, once more alert, will need PT/OT but pt will go home when ready    UTI (lower urinary tract infection) - Rocephin as noted above and will follow up on urine cultures    Shingles  - involving right shoulder, chest and axillary area - will start valtrex again and renally dose, will need additional 7 days as  some areas still not crusted   Alzheimer's dementia - rather frail and chronically ill appearing, non verbal, not following any commands, but alert with open eyes  - high risk for aspiration  - SLP requested    Parkinsonian syndrome  - upper extremities more contracted - will need some OT while inpatient    HTN - continue home medical regimen Hydralazine PO   Hypothyroidism  - continue synthroid    Severe PCM - in the context of acute illness imposed on progressive nature of chronic illnesses outlined above - SLP requested - will ask for nutritionist assistance as well    Underweight  - Body mass index is 16.5 kg/(m^2).  DVT prophylaxis - Lovenox SQ  Radiological Exams on Admission: Dg Chest Port 1 View  03/25/2015   Limited exam without acute abnormality.    Code Status: Full Family Communication: Son at bedside  Disposition Plan: Admit for further evaluation    Mart Piggs [facility] [phone]   Review of Systems:  Unable to obtain due to altered mental status and dementia      Past Medical History  Diagnosis Date  . IBS (irritable bowel syndrome)   . Diverticulosis   . Cataract   . OAB (overactive bladder)   . Macular degeneration   . Alzheimer disease   . Hypertension   . Arthritis   . Osteoporosis   . Parkinsonian syndrome   . Syncope, vasovagal   . Neuromuscular disorder     parkisonian syndrome  . Dementia due to Parkinson's disease without  behavioral disturbance   . Unspecified transient cerebral ischemia   . Unspecified urinary incontinence   . Senile dementia, uncomplicated   . Unspecified constipation   . Hypothyroidism   . Anemia, unspecified   . Depression   . Alzheimer's disease   . Gout, unspecified   . Thoracic or lumbosacral neuritis or radiculitis, unspecified   . Progressive supranuclear palsy   . Unspecified transient cerebral ischemia   . Unspecified urinary incontinence   . Syncope and collapse   . Paralysis agitans   . Unspecified  transient cerebral ischemia   . Pneumonitis due to inhalation of food or vomitus   . Cellulitis and abscess of oral soft tissues   . Screening for lipoid disorders   . Senile dementia, uncomplicated   . Unspecified constipation   . Disorder of bone and cartilage, unspecified   . Other specified acquired hypothyroidism   . Unspecified hypothyroidism   . Anemia, unspecified   . Depressive disorder, not elsewhere classified   . Alzheimer's disease   . Paralysis agitans   . Pneumonia, organism unspecified   . Pseudomonas infection in conditions classified elsewhere and of unspecified site   . Unspecified essential hypertension   . Urinary tract infection, site not specified   . Osteoarthrosis, unspecified whether generalized or localized, unspecified site   . Osteoporosis, unspecified   . Gout, unspecified   . Thoracic or lumbosacral neuritis or radiculitis, unspecified   . Stroke     Past Surgical History  Procedure Laterality Date  . Esophagogastroduodenoscopy    . Eye surgery  2006    LEFT CATARACT    Social History:  reports that he has quit smoking. He has never used smokeless tobacco. He reports that he does not drink alcohol or use illicit drugs.  Allergies  Allergen Reactions  . Other Other (See Comments)    Dairy products- cause swallowing difficulty     Family History  Problem Relation Age of Onset  . Cancer Mother   . Stroke Father   . Liver disease Father   . Kidney disease Brother   . Liver disease Brother   . Emphysema Brother     Prior to Admission medications   Medication Sig Start Date End Date Taking? Authorizing Provider  acetaminophen (TYLENOL) 500 MG tablet Take 1,000 mg by mouth 3 (three) times daily.    Yes Historical Provider, MD  AMBULATORY NON FORMULARY MEDICATION Pressure Air Mattress Dx: G60.8, L89.150 10/30/14  Yes Tiffany L Reed, DO  aspirin EC 325 MG tablet Take 325 mg by mouth every evening.   Yes Historical Provider, MD  bacitracin  ointment Apply 1 application topically 2 (two) times daily. Applies to head of penis to decrease infection from catheter.   Yes Historical Provider, MD  Calcium Carbonate-Vit D-Min (CALTRATE 600+D PLUS) 600-800 MG-UNIT CHEW Chew 1 tablet by mouth 2 (two) times daily with a meal.    Yes Historical Provider, MD  CRANBERRY SOFT PO Take 2 capsules by mouth 3 (three) times daily.    Yes Historical Provider, MD  Cyanocobalamin (VITAMIN B-12) 5000 MCG SUBL Place 5,000 mcg under the tongue 2 (two) times daily.   Yes Historical Provider, MD  ferrous sulfate 325 (65 FE) MG tablet Take 325 mg by mouth every other day.    Yes Historical Provider, MD  guaiFENesin (MUCINEX) 600 MG 12 hr tablet Take 600 mg by mouth 2 (two) times daily as needed for cough.   Yes Historical Provider, MD  hydrALAZINE (APRESOLINE)  25 MG tablet Take 1/2 tablet by mouth twice daily to control blood pressure 11/14/14  Yes Mahima Pandey, MD  lactose free nutrition (BOOST) LIQD Take 90 mLs by mouth 3 (three) times daily between meals. Patient taking differently: Take 90 mLs by mouth 2 (two) times daily between meals.  07/01/14  Yes Barton Dubois, MD  levothyroxine (SYNTHROID, LEVOTHROID) 25 MCG tablet TAKE 1 TABLET BY MOUTH DAILY 10/19/14  Yes Tiffany L Reed, DO  lidocaine (LIDODERM) 5 % PLACE 3 PATCHES ONTO THE SKIN DAILY. REMOVE & DISCARD PATCH WITHIN 12 HOURS OR AS DIRECTED BY MD 02/16/15  Yes Tiffany L Reed, DO  Melatonin 5 MG TABS Take 5 mg by mouth at bedtime as needed (For sleep.).   Yes Historical Provider, MD  memantine (NAMENDA) 10 MG tablet Take 10 mg by mouth 2 (two) times daily.   Yes Historical Provider, MD  Multiple Vitamins-Minerals (ICAPS AREDS FORMULA PO) Take 2 capsules by mouth 2 (two) times daily with a meal.    Yes Historical Provider, MD  polyethylene glycol (MIRALAX / GLYCOLAX) packet Take 17 g by mouth daily as needed for mild constipation. 07/01/14  Yes Barton Dubois, MD  Probiotic Product ([place]) CAPS  Take 1 capsule by mouth 2 (two) times daily.   Yes Historical Provider, MD  psyllium (METAMUCIL) 58.6 % packet 2 tablespoons two times daily   Yes Historical Provider, MD  VOLTAREN 1 % GEL Apply 2 g topically 4 (four) times daily.  07/10/14  Yes Historical Provider, MD  clotrimazole-betamethasone (LOTRISONE) cream Apply 1 application topically 2 (two) times daily as needed (Applies to body for rash.).  02/01/14   Historical Provider, MD  valACYclovir (VALTREX) 1000 MG tablet Take 1 tablet (1,000 mg total) by mouth 3 (three) times daily. Patient not taking: Reported on 03/25/2015 03/16/15   Gildardo Cranker, DO    Physical Exam: Filed Vitals:   03/25/15 1318 03/25/15 1320 03/25/15 1328  BP:  134/73   Pulse:  55   Temp:   98.3 F (36.8 C)  TempSrc:   Rectal  Resp:  16   SpO2: 97% 96%     Physical Exam  Constitutional: Appears cachectic and chronically ill male HENT: Normocephalic. External right and left ear normal. Dry MM Eyes: Conjunctivae and EOM are normal. PERRLA, no scleral icterus.  Neck: Neck supple. Difficult to move, rather stiff, No JVD. No tracheal deviation. No thyromegaly.  CVS: RRR, S1/S2 +, no gallops, no carotid bruit.  Pulmonary: Effort and breath sounds normal, no stridor, poor inspiratory effort, diminished breath sounds at bases  Abdominal: Soft. BS +,  no distension, tenderness, rebound or guarding.  Musculoskeletal: limited examination due to contracted upper extremities  Lymphadenopathy: No lymphadenopathy noted, cervical, inguinal. Neuro: Alert. Non verbal, not following any commands, not moving any extremities but withdraws to nail bed pressure  Skin: Rash in right upper chest area, macular with scattered vesicular rash, extends to right axillary area and right subscapular area  Psychiatric: Difficult to assess due to altered mental status   Labs on Admission:  Basic Metabolic Panel:  Recent Labs Lab 03/25/15 1520  NA 137  K 4.2  CL 101  CO2 27  GLUCOSE  100*  BUN 24*  CREATININE 1.14  CALCIUM 9.4   CBC:  Recent Labs Lab 03/25/15 1520  WBC 7.7  NEUTROABS 6.2  HGB 13.0  HCT 40.9  MCV 89.5  PLT 213    EKG: pending    If 7PM-7AM, please contact night-coverage  [email or web address] Password [email or web address] 03/25/2015, 4:07 PM

## 2015-03-25 NOTE — ED Notes (Signed)
MD at bedside. 

## 2015-03-25 NOTE — ED Notes (Signed)
Bed: YD74 Expected date: 03/25/15 Expected time: 1:03 PM Means of arrival: Ambulance Comments: Syncope

## 2015-03-26 DIAGNOSIS — N390 Urinary tract infection, site not specified: Principal | ICD-10-CM

## 2015-03-26 DIAGNOSIS — R4182 Altered mental status, unspecified: Secondary | ICD-10-CM

## 2015-03-26 DIAGNOSIS — B029 Zoster without complications: Secondary | ICD-10-CM

## 2015-03-26 DIAGNOSIS — E039 Hypothyroidism, unspecified: Secondary | ICD-10-CM

## 2015-03-26 LAB — BASIC METABOLIC PANEL
ANION GAP: 8 (ref 5–15)
BUN: 18 mg/dL (ref 6–20)
CHLORIDE: 107 mmol/L (ref 101–111)
CO2: 23 mmol/L (ref 22–32)
Calcium: 8.8 mg/dL — ABNORMAL LOW (ref 8.9–10.3)
Creatinine, Ser: 0.71 mg/dL (ref 0.61–1.24)
GFR calc non Af Amer: >60 mL/min (ref >60)
GLUCOSE: 80 mg/dL (ref 70–99)
POTASSIUM: 3.6 mmol/L (ref 3.5–5.1)
SODIUM: 138 mmol/L (ref 135–145)

## 2015-03-26 LAB — CBC
HCT: 34.7 % — ABNORMAL LOW (ref 39.0–52.0)
HEMOGLOBIN: 11.2 g/dL — AB (ref 13.0–17.0)
MCH: 28.7 pg (ref 26.0–34.0)
MCHC: 32.3 g/dL (ref 30.0–36.0)
MCV: 89 fL (ref 78.0–100.0)
Platelets: 195 10*3/uL (ref 150–400)
RBC: 3.9 MIL/uL — ABNORMAL LOW (ref 4.22–5.81)
RDW: 14.1 % (ref 11.5–15.5)
WBC: 5.4 10*3/uL (ref 4.0–10.5)

## 2015-03-26 MED ORDER — HYDRALAZINE HCL 20 MG/ML IJ SOLN: 5.0000 mg | Freq: Four times a day (QID) | INTRAMUSCULAR | Status: DC | PRN

## 2015-03-26 MED ORDER — SODIUM CHLORIDE 0.9 % IV SOLN
INTRAVENOUS | Status: DC
  Administered 2015-03-26 – 2015-03-29 (×4): via INTRAVENOUS

## 2015-03-26 NOTE — Progress Notes (Signed)
[facility] PROGRESS NOTE  KHANG HANNUM UDJ:[id number] DOB: 01-28-1917 DOA: 03/25/2015 PCP: Hollace Kinnier, DO  Assessment/Plan:  Active Problems:   Acute encephalopathy  - appears to be multifactorial and secondary to progressive FTT, dehydration from poor oral intake, UTI, and possibly shingles - will Continue gentle fluid hydration, Antibiotics and antiviral   UTI (lower urinary tract infection) - Pt is currently on > 100,000 cfu of gram negative rods - Rocephin as noted above and will follow up on urine cultures    Shingles  - involving right shoulder, chest and axillary area - Continue valtrex will need additional 7 days as some areas still not crusted   Alzheimer's dementia - rather frail and chronically ill appearing, non verbal, not following any commands, but alert with open eyes  - high risk for aspiration  - SLP requested    Parkinsonian syndrome  - upper extremities more contracted - OT evaluation while inpatient    HTN - continue home medical regimen Hydralazine PO   Hypothyroidism  - continue synthroid    Severe PCM - in the context of acute illness imposed on progressive nature of chronic illnesses outlined above - SLP requested - will ask for nutritionist assistance as well    Underweight  - Body mass index is 16.5 kg/(m^2).  Code Status: full Family Communication: No family at bedside Disposition Plan: Pending improvement in condition   Consultants:  None  Procedures:  None  Antibiotics:  Rocephin  HPI/Subjective: Pt has no new complaints. Family reports improvement in mental status  Objective: Filed Vitals:   03/26/15 1612  BP: 148/86  Pulse:   Temp:   Resp:     Intake/Output Summary (Last 24 hours) at 03/26/15 1630 Last data filed at 03/26/15 1440  Gross per 24 hour  Intake 1741.8 ml  Output    650 ml  Net 1091.8 ml   Filed Weights   03/25/15 1834  Weight: 52.164 kg (115 lb)     Exam:   General:  Pt in nad, alert and awake  Cardiovascular: S1 and S2 present  Respiratory: rrr, no mrg  Abdomen: soft, NT, ND  Musculoskeletal: no cyanosis or clubbing   Data Reviewed: Basic Metabolic Panel:  Recent Labs Lab 03/25/15 1520 03/26/15 0425  NA 137 138  K 4.2 3.6  CL 101 107  CO2 27 23  GLUCOSE 100* 80  BUN 24* 18  CREATININE 1.14 0.71  CALCIUM 9.4 8.8*   Liver Function Tests: No results for input(s): AST, ALT, ALKPHOS, BILITOT, PROT, ALBUMIN in the last 168 hours. No results for input(s): LIPASE, AMYLASE in the last 168 hours. No results for input(s): AMMONIA in the last 168 hours. CBC:  Recent Labs Lab 03/25/15 1520 03/26/15 0425  WBC 7.7 5.4  NEUTROABS 6.2  --   HGB 13.0 11.2*  HCT 40.9 34.7*  MCV 89.5 89.0  PLT 213 195   Cardiac Enzymes: No results for input(s): CKTOTAL, CKMB, CKMBINDEX, TROPONINI in the last 168 hours. BNP (last 3 results) No results for input(s): BNP in the last 8760 hours.  ProBNP (last 3 results) No results for input(s): PROBNP in the last 8760 hours.  CBG: No results for input(s): GLUCAP in the last 168 hours.  Recent Results (from the past 240 hour(s))  Urine culture     Status: None (Preliminary result)   Collection Time: 03/25/15  2:26 PM  Result Value Ref Range Status   Specimen Description URINE, CATHETERIZED  Final   Special Requests NONE  Final   Colony Count   Final    >=100,000 COLONIES/ML Performed at [facility]    Culture   Final    [place] Performed at [facility]    Report Status PENDING  Incomplete     Studies: Dg Chest Port 1 View  03/25/2015   CLINICAL DATA:  Syncopal episode  EXAM: PORTABLE CHEST - 1 VIEW  COMPARISON:  01/10/2015  FINDINGS: Examination is somewhat limited secondary to the patient's inability to adequately position herself. The cardiac shadow is mildly enlarged but stable. The lungs are well aerated bilaterally. The previously seen  nodular densities are not as well appreciated on today's exam. No focal confluent infiltrate is seen.  IMPRESSION: Limited exam without acute abnormality.   Electronically Signed   By: Inez Catalina M.D.   On: 03/25/2015 15:14    Scheduled Meds: . acetaminophen  1,000 mg Oral TID  . aspirin EC  325 mg Oral QPM  . calcium-vitamin D  1 tablet Oral BID WC  . cefTRIAXone (ROCEPHIN)  IV  1 g Intravenous Q24H  . enoxaparin (LOVENOX) injection  40 mg Subcutaneous Q24H  . [START ON 03/27/2015] ferrous sulfate  325 mg Oral QODAY  . hydrALAZINE  10 mg Oral 3 times per day  . lactose free nutrition  90 mL Oral TID BM  . levothyroxine  25 mcg Oral QAC breakfast  . memantine  10 mg Oral BID  . valACYclovir  1,000 mg Oral BID   Continuous Infusions: . sodium chloride      Active Problems:   UTI (lower urinary tract infection)    Time spent: > 35 minutes    Velvet Bathe  [facility] Pager [phone]. If 7PM-7AM, please contact night-coverage at [email or web address], password [email or web address] 03/26/2015, 4:30 PM  LOS: 1 day

## 2015-03-26 NOTE — Progress Notes (Signed)
Initial Nutrition Assessment  DOCUMENTATION CODES:  Severe malnutrition in context of chronic illness, Underweight  INTERVENTION:  Continue Boost Plus TID, each provides 360 kcal and 14g of protein RD to continue to monitor  NUTRITION DIAGNOSIS:  Malnutrition related to chronic illness as evidenced by 28% percent weight loss, severe depletion of body fat, severe depletion of muscle mass.  GOAL:  Patient will meet greater than or equal to 90% of their needs  MONITOR:  PO intake, Supplement acceptance, Labs, Weight trends, Skin, I & O's  REASON FOR ASSESSMENT:  Consult Assessment of nutrition requirement/status  ASSESSMENT: 81 yo male with multiple medical conditions including alzheimer's dementia, HTN, parkinsonian syndrome, hypothyroidism, presented to [facility] ED with main concern of several days duration of progressive failure to thrive, poor oral intake, mostly bed bound.   Pt in room with no family at bedside. RD visited with pt while MD and RN in room. Pt minimally responds to questions. Unable to obtain much history at this time.  Lunch tray was still in room during visit. RD noted 50-75% meal completion. Per RN, pt has been eating, he just needs encouragement to swallow. SLP following, recommend Dysphagia 3 diet with thin liquids. Pt with high risk of aspiration.  Per ED note, pt has not eaten 4-5 days PTA. Per RN, pt drank a Boost supplement during swallow eval.  Per weight history, pt has lost 45 lb since August 2015 (28% weight loss x 9 months, significant for time frame).  Height:  Ht Readings from Last 1 Encounters:  03/25/15 5\' 10"  (1.778 m)    Weight:  Wt Readings from Last 1 Encounters:  03/25/15 115 lb (52.164 kg)    Ideal Body Weight:  75.5 kg  Wt Readings from Last 10 Encounters:  03/25/15 115 lb (52.164 kg)  01/11/15 115 lb (52.164 kg)  11/22/14 115 lb (52.164 kg)  06/28/14 160 lb 3.3 oz (72.67 kg)  04/20/14 150 lb (68.04 kg)  03/31/14 141 lb 8  oz (64.184 kg)  03/29/14 158 lb (71.668 kg)  03/28/14 153 lb 11.2 oz (69.718 kg)  03/13/14 145 lb 15.1 oz (66.2 kg)  01/16/14 150 lb 2.1 oz (68.1 kg)    BMI:  Body mass index is 16.5 kg/(m^2).  Estimated Nutritional Needs:  Kcal:  1550-1750  Protein:  80-90g  Fluid:  1.6L/day     Skin:  Wound (see comment) (Shingles rash)  Diet Order:  DIET DYS 3 Room service appropriate?: No; Fluid consistency:: Thin  EDUCATION NEEDS:  No education needs identified at this time   Intake/Output Summary (Last 24 hours) at 03/26/15 1327 Last data filed at 03/26/15 0536  Gross per 24 hour  Intake 1578.8 ml  Output    451 ml  Net 1127.8 ml    Last BM:  5/8  Clayton Bibles, MS, RD, LDN Pager: [phone] After Hours Pager: [phone]

## 2015-03-26 NOTE — Discharge Instructions (Signed)
Nutrition Post Hospital Stay Proper nutrition can help your body recover from illness and injury.   Foods and beverages high in protein, vitamins, and minerals help rebuild muscle loss, promote healing, & reduce fall risk.   .In addition to eating healthy foods, a nutrition shake is an easy, delicious way to get the nutrition you need during and after your hospital stay  It is recommended that you continue to drink 2-3 bottles per day of: Boost Plus for at least 1 month (30 days) after your hospital stay   Tips for adding a nutrition shake into your routine: As allowed, drink one with vitamins or medications instead of water or juice Enjoy one as a tasty mid-morning or afternoon snack Drink cold or make a milkshake out of it Drink one instead of milk with cereal or snacks Use as a coffee creamer   Available at the following grocery stores and pharmacies:           * [employer] * [employer] * [employer]  * [employer]          * [employer] * [employer]  * [facility]      * Target  * [employer]   * [facility]  * [employer]   * [facility] [phone]            For COUPONS visit: [email or web address]/join or [email or web address]   Suggested Substitutions Ensure Plus = Boost Plus = Carnation Breakfast Essentials = Boost Compact Ensure Active Clear = Boost Breeze Glucerna Shake = Boost Glucose Control = Carnation Breakfast Essentials SUGAR FREE    

## 2015-03-26 NOTE — Evaluation (Signed)
Clinical/Bedside Swallow Evaluation Patient Details  Name: Ricky [name] MRN: [id number] Date of Birth: 1916/12/09  Today's Date: 03/26/2015 Time: SLP Start Time (ACUTE ONLY): 9563 SLP Stop Time (ACUTE ONLY): 1104 SLP Time Calculation (min) (ACUTE ONLY): 24 min  Past Medical History:  Past Medical History  Diagnosis Date  . IBS (irritable bowel syndrome)   . Diverticulosis   . Cataract   . OAB (overactive bladder)   . Macular degeneration   . Alzheimer disease   . Hypertension   . Arthritis   . Osteoporosis   . Parkinsonian syndrome   . Syncope, vasovagal   . Neuromuscular disorder     parkisonian syndrome  . 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 Unspecified transient cerebral ischemia   . Unspecified urinary incontinence   . Senile dementia, uncomplicated   . Unspecified constipation   . Hypothyroidism   . Anemia, unspecified   . Depression   . Alzheimer's disease   . Gout, unspecified   . Thoracic or lumbosacral neuritis or radiculitis, unspecified   . Progressive supranuclear palsy   . Unspecified transient cerebral ischemia   . Unspecified urinary incontinence   . Syncope and collapse   . Paralysis agitans   . Unspecified transient cerebral ischemia   . Pneumonitis due to inhalation of food or vomitus   . Cellulitis and abscess of oral soft tissues   . Screening for lipoid disorders   . Senile dementia, uncomplicated   . Unspecified constipation   . Disorder of bone and cartilage, unspecified   . Other specified acquired hypothyroidism   . Unspecified hypothyroidism   . Anemia, unspecified   . Depressive disorder, not elsewhere classified   . Alzheimer's disease   . Paralysis agitans   . Pneumonia, organism unspecified   . Pseudomonas infection in conditions classified elsewhere and of unspecified site   . Unspecified essential hypertension   . Urinary tract infection, site not specified   . Osteoarthrosis, unspecified  whether generalized or localized, unspecified site   . Osteoporosis, unspecified   . Gout, unspecified   . Thoracic or lumbosacral neuritis or radiculitis, unspecified   . Stroke    Past Surgical History:  Past Surgical History  Procedure Laterality Date  . Esophagogastroduodenoscopy    . Eye surgery  2006    LEFT CATARACT   HPI:  81 yo male adm to [facility] with AMS - PMH + for dementia, Parkinsons' dx vs PSP.  (family notes states pt has PSP) Per MD note, AMS appears to be multifactorial and secondary to progressive FTT, dehydration from poor oral intake, UTI, shingles.  Pt on tx for shingles.  Swallow evaluation ordered. CXR negative.      Assessment / Plan / Recommendation Clinical Impression  Findings continue to be consistent with swallow ability from August 2013 - no overt indications of aspiration with intake.    Pt does continue with delayed oral transiting- due to weakness/discoordination.  Pharyngeal swallow initiation may be delayed however pt appears protective of his airway.  Multiple swallows (suspect piecemealing) across consistencies noted-with 2nd swallow delayed up to 25 seconds.    Pt does appear to indicate that he has not cleared his oral cavity by keeping his lips sealed when intake offered- helping to protect his airway.  Uncertain if he responded to cues to swallow or boluses spilled into pharynx eliciting swallow.    Feeding pt will be laborious at times and his dysphagia may contribute to malnutrition/dehdration risk. No  family present to educate but they have been managing pt's dysphagia beautifully.  Skilled intervention included determining effective compensation strategies for pt to mitigate aspiration risk and maximize intake.   Recommend continue dys3/thin diet with strict aspiration precautions.  .      Aspiration Risk  Moderate    Diet Recommendation Dysphagia 3 (Mech soft);Thin   Medication Administration: Crushed with puree Compensations:  (allow time  for extra swallow)    Other  Recommendations Oral Care Recommendations: Oral care BID   Follow Up Recommendations       Frequency and Duration        Pertinent Vitals/Pain Afebrile, decreased      Swallow Study Prior Functional Status   see hhx     General Date of Onset: 03/26/15 Other Pertinent Information: 81 yo male adm to [facility] with AMS - PMH + for dementia, Parkinsons' dx vs PSP.  Per MD note, AMS appears to be multifactorial and secondary to progressive FTT, dehydration from poor oral intake, UTI, shingles.  Pt on tx for shingles.  Swallow evaluation ordered. CXR negative.    Type of Study: Bedside swallow evaluation Previous Swallow Assessment: MBS and BSE - Last BSE completed consistent with findings from [facility] conducted in August 2013. He continued with signs of delayed oral transiting, piecemealing and delayed pharyngeal swallow. Intermittent multiple swallows noted across consistencies, uncertain if due to piecemealing or residuals. Recommendation was for dys3/thin.   Diet Prior to this Study: Dysphagia 3 (soft);Thin liquids Temperature Spikes Noted: No Respiratory Status: Room air History of Recent Intubation: No Behavior/Cognition: Alert;Requires cueing;Doesn't follow directions (inconsistently follows directions) Oral Cavity - Dentition:  (few dentition) Self-Feeding Abilities: Total assist Patient Positioning: Upright in bed Baseline Vocal Quality: Low vocal intensity Volitional Cough: Cognitively unable to elicit Volitional Swallow: Unable to elicit    Oral/Motor/Sensory Function Overall Oral Motor/Sensory Function:  (pt leaning left, did not follow directions for OME but able to seal lips on straw, spoon, cup)   Ice Chips Ice chips: Not tested   Thin Liquid Thin Liquid: Impaired Presentation: Cup;Spoon;Straw Oral Phase Impairments: Reduced lingual movement/coordination;Impaired anterior to posterior transit Oral Phase Functional Implications: Prolonged oral  transit;Oral holding Pharyngeal  Phase Impairments: Suspected delayed Swallow;Multiple swallows    Nectar Thick Nectar Thick Liquid: Not tested   Honey Thick Honey Thick Liquid: Not tested   Puree Puree: Impaired Presentation: Self Fed Oral Phase Impairments: Impaired anterior to posterior transit;Reduced lingual movement/coordination Oral Phase Functional Implications: Prolonged oral transit;Oral holding Pharyngeal Phase Impairments: Suspected delayed Swallow;Multiple swallows   Solid   GO    Solid: Impaired Oral Phase Impairments: Reduced lingual movement/coordination;Impaired anterior to posterior transit;Impaired mastication Oral Phase Functional Implications: Oral holding (prolonged transiting) Pharyngeal Phase Impairments: Suspected delayed Swallow;Multiple swallows      Luanna Salk, [facility] SLP [phone]

## 2015-03-27 DIAGNOSIS — E860 Dehydration: Secondary | ICD-10-CM

## 2015-03-27 LAB — URINE CULTURE

## 2015-03-27 MED ORDER — CIPROFLOXACIN IN D5W 400 MG/200ML IV SOLN
400.0000 mg | Freq: Two times a day (BID) | INTRAVENOUS | Status: DC
  Administered 2015-03-27 – 2015-03-29 (×4): 400 mg via INTRAVENOUS
  Filled 2015-03-27 (×5): qty 200

## 2015-03-27 NOTE — Plan of Care (Signed)
Problem: Phase I Progression Outcomes Goal: Voiding-avoid urinary catheter unless indicated Outcome: Not Applicable Date Met:  08/56/94 Has chronic foley

## 2015-03-27 NOTE — Progress Notes (Signed)
ANTIBIOTIC CONSULT NOTE - INITIAL  Pharmacy Consult for Cipro Indication: UTI  Allergies  Allergen Reactions  . Other Other (See Comments)    Dairy products- cause swallowing difficulty     Patient Measurements: Height: 5\' 10"  (177.8 cm) (per family) Weight: 115 lb (52.164 kg) (per the family) IBW/kg (Calculated) : 73  Vital Signs: Temp: 98.1 F (36.7 C) (05/10 1440) Temp Source: Axillary (05/10 1440) BP: 165/75 mmHg (05/10 1440) Pulse Rate: 55 (05/10 1440) Intake/Output from previous day: 05/09 0701 - 05/10 0700 In: 2107.2 [P.O.:600; I.V.:1507.2] Out: 650 [Urine:650] Intake/Output from this shift: Total I/O In: 551.7 [P.O.:120; I.V.:431.7] Out: 200 [Urine:200]  Labs:  Recent Labs  03/25/15 1520 03/26/15 0425  WBC 7.7 5.4  HGB 13.0 11.2*  PLT 213 195  CREATININE 1.14 0.71   Estimated Creatinine Clearance: 40.8 mL/min (by C-G formula based on Cr of 0.71). No results for input(s): VANCOTROUGH, VANCOPEAK, VANCORANDOM, GENTTROUGH, GENTPEAK, GENTRANDOM, TOBRATROUGH, TOBRAPEAK, TOBRARND, AMIKACINPEAK, AMIKACINTROU, AMIKACIN in the last 72 hours.   Microbiology: Recent Results (from the past 720 hour(s))  Urine culture     Status: None   Collection Time: 03/25/15  2:26 PM  Result Value Ref Range Status   Specimen Description URINE, CATHETERIZED  Final   Special Requests NONE  Final   Colony Count   Final    >=100,000 COLONIES/ML Performed at [facility]    Culture   Final    PSEUDOMONAS AERUGINOSA Performed at [facility]    Report Status 03/27/2015 FINAL  Final   Organism ID, Bacteria PSEUDOMONAS AERUGINOSA  Final      Susceptibility   Pseudomonas aeruginosa - MIC*    CEFEPIME 8 SENSITIVE Sensitive     CEFTAZIDIME 4 SENSITIVE Sensitive     CIPROFLOXACIN 1 SENSITIVE Sensitive     GENTAMICIN 2 SENSITIVE Sensitive     IMIPENEM 2 SENSITIVE Sensitive     PIP/TAZO 32 SENSITIVE Sensitive     TOBRAMYCIN <=1 SENSITIVE Sensitive     *  PSEUDOMONAS AERUGINOSA    Medical History: Past Medical History  Diagnosis Date  . IBS (irritable bowel syndrome)   . Diverticulosis   . Cataract   . OAB (overactive bladder)   . Macular degeneration   . Alzheimer disease   . Hypertension   . Arthritis   . Osteoporosis   . Parkinsonian syndrome   . Syncope, vasovagal   . Neuromuscular disorder     parkisonian syndrome  . 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 Unspecified transient cerebral ischemia   . Unspecified urinary incontinence   . Senile dementia, uncomplicated   . Unspecified constipation   . Hypothyroidism   . Anemia, unspecified   . Depression   . Alzheimer's disease   . Gout, unspecified   . Thoracic or lumbosacral neuritis or radiculitis, unspecified   . Progressive supranuclear palsy   . Unspecified transient cerebral ischemia   . Unspecified urinary incontinence   . Syncope and collapse   . Paralysis agitans   . Unspecified transient cerebral ischemia   . Pneumonitis due to inhalation of food or vomitus   . Cellulitis and abscess of oral soft tissues   . Screening for lipoid disorders   . Senile dementia, uncomplicated   . Unspecified constipation   . Disorder of bone and cartilage, unspecified   . Other specified acquired hypothyroidism   . Unspecified hypothyroidism   . Anemia, unspecified   . Depressive disorder, not elsewhere classified   .  Alzheimer's disease   . Paralysis agitans   . Pneumonia, organism unspecified   . Pseudomonas infection in conditions classified elsewhere and of unspecified site   . Unspecified essential hypertension   . Urinary tract infection, site not specified   . Osteoarthrosis, unspecified whether generalized or localized, unspecified site   . Osteoporosis, unspecified   . Gout, unspecified   . Thoracic or lumbosacral neuritis or radiculitis, unspecified   . Stroke     Medications:  Scheduled:  . acetaminophen  1,000 mg Oral TID   . aspirin EC  325 mg Oral QPM  . calcium-vitamin D  1 tablet Oral BID WC  . ciprofloxacin  400 mg Intravenous Q12H  . enoxaparin (LOVENOX) injection  40 mg Subcutaneous Q24H  . ferrous sulfate  325 mg Oral QODAY  . hydrALAZINE  10 mg Oral 3 times per day  . lactose free nutrition  90 mL Oral TID BM  . levothyroxine  25 mcg Oral QAC breakfast  . memantine  10 mg Oral BID  . valACYclovir  1,000 mg Oral BID   Infusions:  . sodium chloride 50 mL/hr at 03/27/15 1444   Assessment:  81 yr male admitted on 5/8 with acute encephalopathy, progressive FTT, UTI and shingles  Ceftriaxone empirically started on 5/8 and urine culture obtained  Urine culture resulted (5/10) = + Pseudomonas, which is pan sensitive  Ceftriaxone has been d/c'ed and pharmacy has been consulted to dose Ciprofloxacin for treatment of UTI  CrCl ~ 40.8 ml/min  Goal of Therapy:  Eradication of infection  Plan:  Cipro 400mg  IV q12h  Vienna Folden, Toribio Harbour, PharmD 03/27/2015,4:19 PM

## 2015-03-27 NOTE — Progress Notes (Addendum)
[facility] PROGRESS NOTE  Ricky [name] FUX:[id number] DOB: Sep 19, 1917 DOA: 03/25/2015 PCP: Hollace Kinnier, DO   Brief narrative: 81 yo male with multiple medical conditions including alzheimer's dementia, HTN, parkinsonian syndrome, hypothyroidism, presented to [facility] ED with main concern of several days duration of progressive failure to thrive, poor oral intake, mostly bed bound. Presented to the ED with altered mental status per family. Currently being treated for dehydration, UTI, and shingles outbreak  Assessment/Plan:  Active Problems:   Acute encephalopathy  - appears to be multifactorial and secondary to progressive FTT, dehydration from poor oral intake, UTI, and possibly shingles - will Continue gentle fluid hydration, Antibiotics and antiviral   UTI (lower urinary tract infection) - Pt is currently on > 100,000 cfu of addendum: Pseudomonas Aeruginosa  Will change antibiotics to cipro - Rocephin as noted above and will follow up on urine cultures    Shingles  - involving right shoulder, chest and axillary area - Continue valtrex will need additional 6 days as some areas still not crusted   Alzheimer's dementia - rather frail and chronically ill appearing, non verbal, not following any commands, but alert with open eyes  - high risk for aspiration  - SLP requested    Parkinsonian syndrome  - upper extremities more contracted - OT evaluation while inpatient    HTN - continue home medical regimen Hydralazine PO   Hypothyroidism  - continue synthroid    Severe PCM - in the context of acute illness imposed on progressive nature of chronic illnesses outlined above - SLP requested - will ask for nutritionist assistance as well    Underweight  - Body mass index is 16.5 kg/(m^2).  Code Status: full Family Communication: No family at bedside Disposition Plan: Pending improvement in condition: May consider d/c within the next 24-48  hours   Consultants:  None  Procedures:  None  Antibiotics:  Rocephin  HPI/Subjective: No acute issues reported overnight.  Objective: Filed Vitals:   03/27/15 1440  BP: 165/75  Pulse: 55  Temp: 98.1 F (36.7 C)  Resp: 16    Intake/Output Summary (Last 24 hours) at 03/27/15 1552 Last data filed at 03/27/15 1444  Gross per 24 hour  Intake 1945.83 ml  Output    650 ml  Net 1295.83 ml   Filed Weights   03/25/15 1834  Weight: 52.164 kg (115 lb)    Exam:   General:  Pt in nad, alert and awake  Cardiovascular: S1 and S2 present  Respiratory: rrr, no mrg  Abdomen: soft, NT, ND  Musculoskeletal: no cyanosis or clubbing   Data Reviewed: Basic Metabolic Panel:  Recent Labs Lab 03/25/15 1520 03/26/15 0425  NA 137 138  K 4.2 3.6  CL 101 107  CO2 27 23  GLUCOSE 100* 80  BUN 24* 18  CREATININE 1.14 0.71  CALCIUM 9.4 8.8*   Liver Function Tests: No results for input(s): AST, ALT, ALKPHOS, BILITOT, PROT, ALBUMIN in the last 168 hours. No results for input(s): LIPASE, AMYLASE in the last 168 hours. No results for input(s): AMMONIA in the last 168 hours. CBC:  Recent Labs Lab 03/25/15 1520 03/26/15 0425  WBC 7.7 5.4  NEUTROABS 6.2  --   HGB 13.0 11.2*  HCT 40.9 34.7*  MCV 89.5 89.0  PLT 213 195   Cardiac Enzymes: No results for input(s): CKTOTAL, CKMB, CKMBINDEX, TROPONINI in the last 168 hours. BNP (last 3 results) No results for input(s): BNP in the last 8760 hours.  ProBNP (last  3 results) No results for input(s): PROBNP in the last 8760 hours.  CBG: No results for input(s): GLUCAP in the last 168 hours.  Recent Results (from the past 240 hour(s))  Urine culture     Status: None   Collection Time: 03/25/15  2:26 PM  Result Value Ref Range Status   Specimen Description URINE, CATHETERIZED  Final   Special Requests NONE  Final   Colony Count   Final    >=100,000 COLONIES/ML Performed at [facility]    Culture   Final     PSEUDOMONAS AERUGINOSA Performed at [facility]    Report Status 03/27/2015 FINAL  Final   Organism ID, Bacteria PSEUDOMONAS AERUGINOSA  Final      Susceptibility   Pseudomonas aeruginosa - MIC*    CEFEPIME 8 SENSITIVE Sensitive     CEFTAZIDIME 4 SENSITIVE Sensitive     CIPROFLOXACIN 1 SENSITIVE Sensitive     GENTAMICIN 2 SENSITIVE Sensitive     IMIPENEM 2 SENSITIVE Sensitive     PIP/TAZO 32 SENSITIVE Sensitive     TOBRAMYCIN <=1 SENSITIVE Sensitive     * PSEUDOMONAS AERUGINOSA     Studies: No results found.  Scheduled Meds: . acetaminophen  1,000 mg Oral TID  . aspirin EC  325 mg Oral QPM  . calcium-vitamin D  1 tablet Oral BID WC  . cefTRIAXone (ROCEPHIN)  IV  1 g Intravenous Q24H  . enoxaparin (LOVENOX) injection  40 mg Subcutaneous Q24H  . ferrous sulfate  325 mg Oral QODAY  . hydrALAZINE  10 mg Oral 3 times per day  . lactose free nutrition  90 mL Oral TID BM  . levothyroxine  25 mcg Oral QAC breakfast  . memantine  10 mg Oral BID  . valACYclovir  1,000 mg Oral BID   Continuous Infusions: . sodium chloride 50 mL/hr at 03/27/15 1444    Active Problems:   UTI (lower urinary tract infection)    Time spent: > 35 minutes    Velvet Bathe  [facility] Pager [phone]. If 7PM-7AM, please contact night-coverage at [email or web address], password [facility] 03/27/2015, 3:52 PM  LOS: 2 days

## 2015-03-27 NOTE — Care Management Note (Signed)
Case Management Note  Patient Details  Name: Ricky [name] MRN: [id number] Date of Birth: 1917-04-24  Subjective/Objective:                    Action/Plan:   Expected Discharge Date:   (unknown)               Expected Discharge Plan:  [place]   Discharge planning Services  CM Consult   Status of Service:  In process, will continue to follow   If discussed at Long Length of Stay Meetings, dates discussed:    Additional Comments: Pt from home with son. Pt has used [facility] services in the past for speech therapy and nursing but is not active with them at this time. Pt could potentially benefit from PT/OT consults to help with disposition planning. CM will continue to follow.  Lynnell Catalan, RN 03/27/2015, 1:38 PM

## 2015-03-28 NOTE — Progress Notes (Signed)
PT Cancellation Note / Screen  Patient Details Name: TRIPTON NED MRN: [id number] DOB: 1916-11-23   Cancelled Treatment:    Reason Eval/Treat Not Completed: PT screened, no needs identified, will sign off Per MD notes, pt bed bound, non verbal, not following commands.  Pt does not appear appropriate for skilled PT at this time.   Also during previous admission 01/12/15, PT evaluation noted: "Pt is total assist +2 for mobility. Pt did not follow commands or initiate any movement. Pt unable to participate with therapy.  Recommend 24 hour supervision/assist at home as long as family remains able to provide care. If family is unable, then recommend SNF."  PT to sign off.   Tarhonda Hollenberg,KATHrine E 03/28/2015, 2:27 PM Carmelia Bake, PT, DPT 03/28/2015 Pager: [phone]

## 2015-03-28 NOTE — Progress Notes (Signed)
[facility] PROGRESS NOTE  Ricky [name] [id number]:[id number] DOB: 12-25-1916 DOA: 03/25/2015 PCP: Ricky Kinnier, DO   Brief narrative: 81 yo male with multiple medical conditions including alzheimer's dementia, HTN, parkinsonian syndrome, hypothyroidism, presented to [facility] ED with main concern of several days duration of progressive failure to thrive, poor oral intake, mostly bed bound. Presented to the ED with altered mental status per family. Currently being treated for dehydration, UTI, and shingles outbreak  Assessment/Plan:  Active Problems:   Acute encephalopathy  - appears to be multifactorial and secondary to progressive FTT, dehydration from poor oral intake, UTI, and possibly shingles Improved much.    UTI (lower urinary tract infection) - Pt is currently on > 100,000 cfu of addendum: Pseudomonas Aeruginosa  On ciprofloxacin.   Shingles  - involving right shoulder, chest and axillary area - Continue valtrex to complete the course.    Alzheimer's dementia - rather frail and chronically ill appearing, non verbal, not following any commands, but alert with open eyes  - high risk for aspiration  - SLP requested , recommended dysphagia 3 diet.    Parkinsonian syndrome  - upper extremities more contracted - OT evaluation while inpatient    HTN Better controlled.    Hypothyroidism  - continue synthroid    Severe PCM - in the context of acute illness imposed on progressive nature of chronic illnesses outlined above - will ask for nutritionist assistance as well    Underweight  - Body mass index is 16.5 kg/(m^2).  Code Status: full Family Communication: No family at bedside Disposition Plan: Pending improvement in condition: May consider d/c within the next 24-48 hours   Consultants:  None  Procedures:  None  Antibiotics:  Rocephin  HPI/Subjective: Pleasant no new complaints.   Objective: Filed Vitals:   03/28/15 1403  BP: 153/88   Pulse: 71  Temp: 97.7 F (36.5 C)  Resp: 18    Intake/Output Summary (Last 24 hours) at 03/28/15 1750 Last data filed at 03/28/15 1400  Gross per 24 hour  Intake 2233.33 ml  Output   2350 ml  Net -116.67 ml   Filed Weights   03/25/15 1834  Weight: 52.164 kg (115 lb)    Exam:   General:  Pt alert afebrile comfortable.   Cardiovascular: S1 and S2 present  Respiratory: rrr, no mrg  Abdomen: soft, NT, ND  Musculoskeletal: no cyanosis or clubbing   Data Reviewed: Basic Metabolic Panel:  Recent Labs Lab 03/25/15 1520 03/26/15 0425  NA 137 138  K 4.2 3.6  CL 101 107  CO2 27 23  GLUCOSE 100* 80  BUN 24* 18  CREATININE 1.14 0.71  CALCIUM 9.4 8.8*   Liver Function Tests: No results for input(s): AST, ALT, ALKPHOS, BILITOT, PROT, ALBUMIN in the last 168 hours. No results for input(s): LIPASE, AMYLASE in the last 168 hours. No results for input(s): AMMONIA in the last 168 hours. CBC:  Recent Labs Lab 03/25/15 1520 03/26/15 0425  WBC 7.7 5.4  NEUTROABS 6.2  --   HGB 13.0 11.2*  HCT 40.9 34.7*  MCV 89.5 89.0  PLT 213 195   Cardiac Enzymes: No results for input(s): CKTOTAL, CKMB, CKMBINDEX, TROPONINI in the last 168 hours. BNP (last 3 results) No results for input(s): BNP in the last 8760 hours.  ProBNP (last 3 results) No results for input(s): PROBNP in the last 8760 hours.  CBG: No results for input(s): GLUCAP in the last 168 hours.  Recent Results (from the past 240 hour(s))  Urine culture     Status: None   Collection Time: 03/25/15  2:26 PM  Result Value Ref Range Status   Specimen Description URINE, CATHETERIZED  Final   Special Requests NONE  Final   Colony Count   Final    >=100,000 COLONIES/ML Performed at [facility]    Culture   Final    PSEUDOMONAS AERUGINOSA Performed at [facility]    Report Status 03/27/2015 FINAL  Final   Organism ID, Bacteria PSEUDOMONAS AERUGINOSA  Final      Susceptibility    Pseudomonas aeruginosa - MIC*    CEFEPIME 8 SENSITIVE Sensitive     CEFTAZIDIME 4 SENSITIVE Sensitive     CIPROFLOXACIN 1 SENSITIVE Sensitive     GENTAMICIN 2 SENSITIVE Sensitive     IMIPENEM 2 SENSITIVE Sensitive     PIP/TAZO 32 SENSITIVE Sensitive     TOBRAMYCIN <=1 SENSITIVE Sensitive     * PSEUDOMONAS AERUGINOSA     Studies: No results found.  Scheduled Meds: . acetaminophen  1,000 mg Oral TID  . aspirin EC  325 mg Oral QPM  . calcium-vitamin D  1 tablet Oral BID WC  . ciprofloxacin  400 mg Intravenous Q12H  . enoxaparin (LOVENOX) injection  40 mg Subcutaneous Q24H  . ferrous sulfate  325 mg Oral QODAY  . hydrALAZINE  10 mg Oral 3 times per day  . lactose free nutrition  90 mL Oral TID BM  . levothyroxine  25 mcg Oral QAC breakfast  . memantine  10 mg Oral BID  . valACYclovir  1,000 mg Oral BID   Continuous Infusions: . sodium chloride 50 mL/hr at 03/28/15 1230    Active Problems:   UTI (lower urinary tract infection)    Time spent: > 35 minutes    Ricky [name]  [facility] Pager [phone] . If 7PM-7AM, please contact night-coverage at [email or web address], password [facility] 03/28/2015, 5:50 PM  LOS: 3 days

## 2015-03-29 DIAGNOSIS — F0391 Unspecified dementia with behavioral disturbance: Secondary | ICD-10-CM

## 2015-03-29 DIAGNOSIS — G9340 Encephalopathy, unspecified: Secondary | ICD-10-CM

## 2015-03-29 LAB — CBC
HEMATOCRIT: 34.9 % — AB (ref 39.0–52.0)
Hemoglobin: 10.9 g/dL — ABNORMAL LOW (ref 13.0–17.0)
MCH: 27.9 pg (ref 26.0–34.0)
MCHC: 31.2 g/dL (ref 30.0–36.0)
MCV: 89.5 fL (ref 78.0–100.0)
Platelets: 192 10*3/uL (ref 150–400)
RBC: 3.9 MIL/uL — ABNORMAL LOW (ref 4.22–5.81)
RDW: 14.2 % (ref 11.5–15.5)
WBC: 4.8 10*3/uL (ref 4.0–10.5)

## 2015-03-29 LAB — BASIC METABOLIC PANEL
Anion gap: 7 (ref 5–15)
BUN: 6 mg/dL (ref 6–20)
CALCIUM: 8.5 mg/dL — AB (ref 8.9–10.3)
CO2: 27 mmol/L (ref 22–32)
Chloride: 103 mmol/L (ref 101–111)
Creatinine, Ser: 0.57 mg/dL — ABNORMAL LOW (ref 0.61–1.24)
Glucose, Bld: 84 mg/dL (ref 65–99)
Potassium: 3.5 mmol/L (ref 3.5–5.1)
SODIUM: 137 mmol/L (ref 135–145)

## 2015-03-29 MED ORDER — CIPROFLOXACIN HCL 500 MG PO TABS
500.0000 mg | ORAL_TABLET | Freq: Two times a day (BID) | ORAL | Status: DC
  Administered 2015-03-29 – 2015-03-30 (×3): 500 mg via ORAL
  Filled 2015-03-29 (×4): qty 1

## 2015-03-29 NOTE — Progress Notes (Signed)
PATIENT DETAILS Name: Ricky [name] Age: 81 y.o. Sex: male Date of Birth: Sep 10, 1917 Admit Date: 03/25/2015 Admitting Physician Theodis Blaze, MD QQV:ZDGL, TIFFANY, DO  Subjective: Pleasantly confused-mumbles incoherently.  Assessment/Plan: Active Problems:   Acute encephalopathy: Multifactorial-possibly secondary to pseudomonal UTI, dehydration and ongoing shingles.Calm-mostly quiet-mumbles at times- Not lethargic-follows commands at times only. Suspect not far from usual baseline.     Pseudomonal UTI: Continue ciprofloxacin. Afebrile.     Zoster: Continue Valtrex.    Hypothyroidism: Continue with levothyroxine     Hypertension: Controlled, continue with hydralazine    Anemia: Hemoglobin stable, continue to follow periodically     Alzheimer's dementia: Not sure what usual baseline is-but suspect not far from baseline-continue with Namenda    Severe protein calorie malnutrition: Continue supplements  Disposition: Remain inpatient-suspect home in am  Antimicrobial agents  See below  Anti-infectives    Start     Dose/Rate Route Frequency Ordered Stop   03/29/15 2000  ciprofloxacin (CIPRO) tablet 500 mg     500 mg Oral 2 times daily 03/29/15 1049     03/27/15 1700  ciprofloxacin (CIPRO) IVPB 400 mg  Status:  Discontinued     400 mg 200 mL/hr over 60 Minutes Intravenous Every 12 hours 03/27/15 1617 03/29/15 1049   03/25/15 1900  valACYclovir (VALTREX) tablet 1,000 mg  Status:  Discontinued     1,000 mg Oral 3 times daily 03/25/15 1711 03/25/15 1840   03/25/15 1900  valACYclovir (VALTREX) tablet 1,000 mg     1,000 mg Oral 2 times daily 03/25/15 1840     03/25/15 1700  cefTRIAXone (ROCEPHIN) 1 g in dextrose 5 % 50 mL IVPB  Status:  Discontinued     1 g 100 mL/hr over 30 Minutes Intravenous Every 24 hours 03/25/15 1649 03/25/15 1800   03/25/15 1530  cefTRIAXone (ROCEPHIN) 1 g in dextrose 5 % 50 mL IVPB  Status:  Discontinued     1 g 100 mL/hr over 30  Minutes Intravenous Every 24 hours 03/25/15 1516 03/27/15 1614      DVT Prophylaxis: Prophylactic Lovenox   Code Status: Full code  Family Communication Son-over the phone  Procedures: None  CONSULTS:  None  Time spent 35 minutes-Greater than 50% of this time was spent in counseling,planning of further management, and coordination of care.  MEDICATIONS: Scheduled Meds: . acetaminophen  1,000 mg Oral TID  . aspirin EC  325 mg Oral QPM  . calcium-vitamin D  1 tablet Oral BID WC  . ciprofloxacin  500 mg Oral BID  . enoxaparin (LOVENOX) injection  40 mg Subcutaneous Q24H  . ferrous sulfate  325 mg Oral QODAY  . hydrALAZINE  10 mg Oral 3 times per day  . lactose free nutrition  90 mL Oral TID BM  . levothyroxine  25 mcg Oral QAC breakfast  . memantine  10 mg Oral BID  . valACYclovir  1,000 mg Oral BID   Continuous Infusions: . sodium chloride 50 mL/hr at 03/29/15 1105   PRN Meds:.guaiFENesin, hydrALAZINE, polyethylene glycol    PHYSICAL EXAM: Vital signs in last 24 hours: Filed Vitals:   03/28/15 1403 03/28/15 2136 03/29/15 0425 03/29/15 1421  BP: 153/88 168/75 155/77 154/84  Pulse: 71 52 51 60  Temp: 97.7 F (36.5 C) 98.2 F (36.8 C) 97.7 F (36.5 C) 97.9 F (36.6 C)  TempSrc: Axillary Axillary Axillary Axillary  Resp: 18 16 20  18  Height:      Weight:      SpO2: 100% 100% 100% 100%    Weight change:  Filed Weights   03/25/15 1834  Weight: 52.164 kg (115 lb)   Body mass index is 16.5 kg/(m^2).   Gen Exam: Awake and alert Neck: Supple, No JVD.   Chest: B/L Clear.   CVS: S1 S2 Regular, no murmurs.  Abdomen: soft, BS +, non tender, non distended.  Extremities: no edema, lower extremities warm to touch. Skin: No Rash.   Wounds: N/A.   Intake/Output from previous day:  Intake/Output Summary (Last 24 hours) at 03/29/15 1616 Last data filed at 03/29/15 1300  Gross per 24 hour  Intake   1940 ml  Output   1200 ml  Net    740 ml     LAB  RESULTS: CBC  Recent Labs Lab 03/25/15 1520 03/26/15 0425 03/29/15 0351  WBC 7.7 5.4 4.8  HGB 13.0 11.2* 10.9*  HCT 40.9 34.7* 34.9*  PLT 213 195 192  MCV 89.5 89.0 89.5  MCH 28.4 28.7 27.9  MCHC 31.8 32.3 31.2  RDW 14.0 14.1 14.2  LYMPHSABS 1.1  --   --   MONOABS 0.4  --   --   EOSABS 0.0  --   --   BASOSABS 0.0  --   --     Chemistries   Recent Labs Lab 03/25/15 1520 03/26/15 0425 03/29/15 0351  NA 137 138 137  K 4.2 3.6 3.5  CL 101 107 103  CO2 27 23 27   GLUCOSE 100* 80 84  BUN 24* 18 6  CREATININE 1.14 0.71 0.57*  CALCIUM 9.4 8.8* 8.5*    CBG: No results for input(s): GLUCAP in the last 168 hours.  GFR Estimated Creatinine Clearance: 40.8 mL/min (by C-G formula based on Cr of 0.57).  Coagulation profile No results for input(s): INR, PROTIME in the last 168 hours.  Cardiac Enzymes No results for input(s): CKMB, TROPONINI, MYOGLOBIN in the last 168 hours.  Invalid input(s): CK  Invalid input(s): POCBNP No results for input(s): DDIMER in the last 72 hours. No results for input(s): HGBA1C in the last 72 hours. No results for input(s): CHOL, HDL, LDLCALC, TRIG, CHOLHDL, LDLDIRECT in the last 72 hours. No results for input(s): TSH, T4TOTAL, T3FREE, THYROIDAB in the last 72 hours.  Invalid input(s): FREET3 No results for input(s): VITAMINB12, FOLATE, FERRITIN, TIBC, IRON, RETICCTPCT in the last 72 hours. No results for input(s): LIPASE, AMYLASE in the last 72 hours.  Urine Studies No results for input(s): UHGB, CRYS in the last 72 hours.  Invalid input(s): UACOL, UAPR, USPG, UPH, UTP, UGL, UKET, UBIL, UNIT, UROB, ULEU, UEPI, UWBC, URBC, UBAC, CAST, UCOM, BILUA  MICROBIOLOGY: Recent Results (from the past 240 hour(s))  Urine culture     Status: None   Collection Time: 03/25/15  2:26 PM  Result Value Ref Range Status   Specimen Description URINE, CATHETERIZED  Final   Special Requests NONE  Final   Colony Count   Final    >=100,000  COLONIES/ML Performed at [facility]    Culture   Final    PSEUDOMONAS AERUGINOSA Performed at [facility]    Report Status 03/27/2015 FINAL  Final   Organism ID, Bacteria PSEUDOMONAS AERUGINOSA  Final      Susceptibility   Pseudomonas aeruginosa - MIC*    CEFEPIME 8 SENSITIVE Sensitive     CEFTAZIDIME 4 SENSITIVE Sensitive     CIPROFLOXACIN 1  SENSITIVE Sensitive     GENTAMICIN 2 SENSITIVE Sensitive     IMIPENEM 2 SENSITIVE Sensitive     PIP/TAZO 32 SENSITIVE Sensitive     TOBRAMYCIN <=1 SENSITIVE Sensitive     * PSEUDOMONAS AERUGINOSA    RADIOLOGY STUDIES/RESULTS: Dg Chest Port 1 View  03/25/2015   CLINICAL DATA:  Syncopal episode  EXAM: PORTABLE CHEST - 1 VIEW  COMPARISON:  01/10/2015  FINDINGS: Examination is somewhat limited secondary to the patient's inability to adequately position herself. The cardiac shadow is mildly enlarged but stable. The lungs are well aerated bilaterally. The previously seen nodular densities are not as well appreciated on today's exam. No focal confluent infiltrate is seen.  IMPRESSION: Limited exam without acute abnormality.   Electronically Signed   By: Inez Catalina M.D.   On: 03/25/2015 15:14    Oren Binet, MD  [facility] Pager:[phone]  If 7PM-7AM, please contact night-coverage [email or web address] Password [email or web address] 03/29/2015, 4:16 PM   LOS: 4 days

## 2015-03-29 NOTE — Care Management Note (Signed)
Case Management Note  Patient Details  Name: Ricky [name] MRN: [id number] Date of Birth: 07-26-1917   Medicare Important Message Given:  Yes Date Medicare IM Given:  03/29/15 Medicare IM give by:  Marney Doctor RN, BSN,NCM Date Additional Medicare IM Given:    Additional Medicare Important Message give by:     If discussed at [place] of Stay Meetings, dates discussed:    Additional Comments:  Lynnell Catalan, RN 03/29/2015, 1:49 PM

## 2015-03-29 NOTE — Progress Notes (Signed)
ANTIBIOTIC CONSULT NOTE - INITIAL  Pharmacy Consult for Cipro Indication: UTI  Allergies  Allergen Reactions  . Other Other (See Comments)    Dairy products- cause swallowing difficulty    Patient Measurements: Height: 5\' 10"  (177.8 cm) (per family) Weight: 115 lb (52.164 kg) (per the family) IBW/kg (Calculated) : 73  Vital Signs: Temp: 97.7 F (36.5 C) (05/12 0425) Temp Source: Axillary (05/12 0425) BP: 155/77 mmHg (05/12 0425) Pulse Rate: 51 (05/12 0425) Intake/Output from previous day: 05/11 0701 - 05/12 0700 In: 2000 [P.O.:800; I.V.:1200] Out: 1750 [Urine:1750] Intake/Output from this shift:    Labs:  Recent Labs  03/29/15 0351  WBC 4.8  HGB 10.9*  PLT 192  CREATININE 0.57*   Estimated Creatinine Clearance: 40.8 mL/min (by C-G formula based on Cr of 0.57). No results for input(s): VANCOTROUGH, VANCOPEAK, VANCORANDOM, GENTTROUGH, GENTPEAK, GENTRANDOM, TOBRATROUGH, TOBRAPEAK, TOBRARND, AMIKACINPEAK, AMIKACINTROU, AMIKACIN in the last 72 hours.   Microbiology: Recent Results (from the past 720 hour(s))  Urine culture     Status: None   Collection Time: 03/25/15  2:26 PM  Result Value Ref Range Status   Specimen Description URINE, CATHETERIZED  Final   Special Requests NONE  Final   Colony Count   Final    >=100,000 COLONIES/ML Performed at [facility]    Culture   Final    PSEUDOMONAS AERUGINOSA Performed at [facility]    Report Status 03/27/2015 FINAL  Final   Organism ID, Bacteria PSEUDOMONAS AERUGINOSA  Final      Susceptibility   Pseudomonas aeruginosa - MIC*    CEFEPIME 8 SENSITIVE Sensitive     CEFTAZIDIME 4 SENSITIVE Sensitive     CIPROFLOXACIN 1 SENSITIVE Sensitive     GENTAMICIN 2 SENSITIVE Sensitive     IMIPENEM 2 SENSITIVE Sensitive     PIP/TAZO 32 SENSITIVE Sensitive     TOBRAMYCIN <=1 SENSITIVE Sensitive     * PSEUDOMONAS AERUGINOSA   Medications:  Scheduled:  . acetaminophen  1,000 mg Oral TID  . aspirin EC   325 mg Oral QPM  . calcium-vitamin D  1 tablet Oral BID WC  . ciprofloxacin  400 mg Intravenous Q12H  . enoxaparin (LOVENOX) injection  40 mg Subcutaneous Q24H  . ferrous sulfate  325 mg Oral QODAY  . hydrALAZINE  10 mg Oral 3 times per day  . lactose free nutrition  90 mL Oral TID BM  . levothyroxine  25 mcg Oral QAC breakfast  . memantine  10 mg Oral BID  . valACYclovir  1,000 mg Oral BID   Assessment:  81 yr male admitted on 5/8 with acute encephalopathy, progressive FTT, UTI and shingles. Ceftriaxone empirically started on 5/8 and urine culture obtained  Urine culture resulted (5/10) = + Pseudomonas, which is pan sensitive  Ceftriaxone has been d/c'ed and pharmacy was consulted to dose Ciprofloxacin for treatment of UTI  Afebrile, WBC wnl, tolerating po meds  Goal of Therapy:  Eradication of infection, abx dose & schedule appropriate for renal function  Plan:  Change Cipro to 500mg  po 12h Recommend 7-10 days of treatment   Minda Ditto PharmD Pager [phone] 03/29/2015, 10:50 AM

## 2015-03-30 MED ORDER — CIPROFLOXACIN HCL 500 MG PO TABS: 500.0000 mg | ORAL_TABLET | Freq: Two times a day (BID) | ORAL | Status: DC

## 2015-03-30 MED ORDER — VALACYCLOVIR HCL 1 G PO TABS: 1000.0000 mg | ORAL_TABLET | Freq: Two times a day (BID) | ORAL | Status: DC

## 2015-03-30 NOTE — Discharge Summary (Addendum)
Physician Discharge Summary  Ricky [name] BJS:[id number] DOB: 1916/12/11 DOA: 03/25/2015  PCP: Hollace Kinnier, DO  Admit date: 03/25/2015 Discharge date: 03/30/2015  Time spent: 30  minutes  Recommendations for Outpatient Follow-up:  1. Follow up with BMP and cbc  in one week.  2. Follow up with PCP in 2 weeks.  3. Follow up with home health RN, pt,ot AND SW as recommended.   Discharge Diagnoses:  Active Problems:   UTI (lower urinary tract infection) acute encephalopathy Dysphagia Anemia Shingles Hypothyroidism Hypertension alzheimers dementia.   Discharge Condition: improved.   Diet recommendation: dysphagia 3 diet with thin liquids.   Filed Weights   03/25/15 1834  Weight: 52.164 kg (115 lb)    History of present illness:   81 year old admitted for confusion.  Hospital Course: Acute encephalopathy: Multifactorial-possibly secondary to pseudomonal UTI, dehydration and ongoing shingles.Calm-mostly quiet-mumbles at times- Not lethargic-follows commands at times only. Suspect not far from usual baseline.    Pseudomonal UTI: Continue ciprofloxacin. Afebrile.    Zoster: Continue Valtrex.   Hypothyroidism: Continue with levothyroxine   Hypertension: Controlled, continue with hydralazine   Anemia: Hemoglobin stable, continue to follow periodically    Alzheimer's dementia: Not sure what usual baseline is-but suspect not far from baseline-continue with Namenda   Severe protein calorie malnutrition: Continue supplements  Procedures:  none  Consultations:  none  Discharge Exam: Filed Vitals:   03/30/15 0537  BP: 146/62  Pulse: 53  Temp: 97.9 F (36.6 C)  Resp: 18    General: alert afebrile comfortable Cardiovascular: s1s2 Respiratory:ctab  Discharge Instructions   Discharge Instructions    Diet - low sodium heart healthy    Complete by:  As directed      Discharge instructions    Complete by:  As directed   Follow up with PCP in 2 weeks.           Current Discharge Medication List    START taking these medications   Details  ciprofloxacin (CIPRO) 500 MG tablet Take 1 tablet (500 mg total) by mouth 2 (two) times daily. Qty: 6 tablet, Refills: 0      CONTINUE these medications which have CHANGED   Details  valACYclovir (VALTREX) 1000 MG tablet Take 1 tablet (1,000 mg total) by mouth 2 (two) times daily.      CONTINUE these medications which have NOT CHANGED   Details  acetaminophen (TYLENOL) 500 MG tablet Take 1,000 mg by mouth 3 (three) times daily.     AMBULATORY NON FORMULARY MEDICATION Pressure Air Mattress Dx: G60.8, L89.150 Qty: 1 each, Refills: 0    aspirin EC 325 MG tablet Take 325 mg by mouth every evening.    bacitracin ointment Apply 1 application topically 2 (two) times daily. Applies to head of penis to decrease infection from catheter.    Calcium Carbonate-Vit D-Min (CALTRATE 600+D PLUS) 600-800 MG-UNIT CHEW Chew 1 tablet by mouth 2 (two) times daily with a meal.     CRANBERRY SOFT PO Take 2 capsules by mouth 3 (three) times daily.     Cyanocobalamin (VITAMIN B-12) 5000 MCG SUBL Place 5,000 mcg under the tongue 2 (two) times daily.    ferrous sulfate 325 (65 FE) MG tablet Take 325 mg by mouth every other day.     guaiFENesin (MUCINEX) 600 MG 12 hr tablet Take 600 mg by mouth 2 (two) times daily as needed for cough.    hydrALAZINE (APRESOLINE) 25 MG tablet Take 1/2 tablet by mouth twice daily  to control blood pressure Qty: 30 tablet, Refills: 6    lactose free nutrition (BOOST) LIQD Take 90 mLs by mouth 3 (three) times daily between meals. Refills: 0    levothyroxine (SYNTHROID, LEVOTHROID) 25 MCG tablet TAKE 1 TABLET BY MOUTH DAILY Qty: 90 tablet, Refills: 1    lidocaine (LIDODERM) 5 % PLACE 3 PATCHES ONTO THE SKIN DAILY. REMOVE & DISCARD PATCH WITHIN 12 HOURS OR AS DIRECTED BY MD Qty: 90 patch, Refills: 1    Melatonin 5 MG TABS Take 5 mg by mouth at bedtime as needed (For sleep.).     memantine (NAMENDA) 10 MG tablet Take 10 mg by mouth 2 (two) times daily.    Multiple Vitamins-Minerals (ICAPS AREDS FORMULA PO) Take 2 capsules by mouth 2 (two) times daily with a meal.     polyethylene glycol (MIRALAX / GLYCOLAX) packet Take 17 g by mouth daily as needed for mild constipation.    Probiotic Product ([place]) CAPS Take 1 capsule by mouth 2 (two) times daily.    psyllium (METAMUCIL) 58.6 % packet 2 tablespoons two times daily    VOLTAREN 1 % GEL Apply 2 g topically 4 (four) times daily.     clotrimazole-betamethasone (LOTRISONE) cream Apply 1 application topically 2 (two) times daily as needed (Applies to body for rash.).        Allergies  Allergen Reactions  . Other Other (See Comments)    Dairy products- cause swallowing difficulty    Follow-up Information    Follow up with REED, TIFFANY, DO In 2 weeks.   Specialty:  Geriatric Medicine   Contact information:   [address] [place] [address] [phone]        The results of significant diagnostics from this hospitalization (including imaging, microbiology, ancillary and laboratory) are listed below for reference.    Significant Diagnostic Studies: Dg Chest Port 1 View  03/25/2015   CLINICAL DATA:  Syncopal episode  EXAM: PORTABLE CHEST - 1 VIEW  COMPARISON:  01/10/2015  FINDINGS: Examination is somewhat limited secondary to the patient's inability to adequately position herself. The cardiac shadow is mildly enlarged but stable. The lungs are well aerated bilaterally. The previously seen nodular densities are not as well appreciated on today's exam. No focal confluent infiltrate is seen.  IMPRESSION: Limited exam without acute abnormality.   Electronically Signed   By: Inez Catalina M.D.   On: 03/25/2015 15:14    Microbiology: Recent Results (from the past 240 hour(s))  Urine culture     Status: None   Collection Time: 03/25/15  2:26 PM  Result Value Ref Range Status   Specimen  Description URINE, CATHETERIZED  Final   Special Requests NONE  Final   Colony Count   Final    >=100,000 COLONIES/ML Performed at [facility]    Culture   Final    PSEUDOMONAS AERUGINOSA Performed at [facility]    Report Status 03/27/2015 FINAL  Final   Organism ID, Bacteria PSEUDOMONAS AERUGINOSA  Final      Susceptibility   Pseudomonas aeruginosa - MIC*    CEFEPIME 8 SENSITIVE Sensitive     CEFTAZIDIME 4 SENSITIVE Sensitive     CIPROFLOXACIN 1 SENSITIVE Sensitive     GENTAMICIN 2 SENSITIVE Sensitive     IMIPENEM 2 SENSITIVE Sensitive     PIP/TAZO 32 SENSITIVE Sensitive     TOBRAMYCIN <=1 SENSITIVE Sensitive     * PSEUDOMONAS AERUGINOSA     Labs: Basic Metabolic  Panel:  Recent Labs Lab 03/25/15 1520 03/26/15 0425 03/29/15 0351  NA 137 138 137  K 4.2 3.6 3.5  CL 101 107 103  CO2 27 23 27   GLUCOSE 100* 80 84  BUN 24* 18 6  CREATININE 1.14 0.71 0.57*  CALCIUM 9.4 8.8* 8.5*   Liver Function Tests: No results for input(s): AST, ALT, ALKPHOS, BILITOT, PROT, ALBUMIN in the last 168 hours. No results for input(s): LIPASE, AMYLASE in the last 168 hours. No results for input(s): AMMONIA in the last 168 hours. CBC:  Recent Labs Lab 03/25/15 1520 03/26/15 0425 03/29/15 0351  WBC 7.7 5.4 4.8  NEUTROABS 6.2  --   --   HGB 13.0 11.2* 10.9*  HCT 40.9 34.7* 34.9*  MCV 89.5 89.0 89.5  PLT 213 195 192   Cardiac Enzymes: No results for input(s): CKTOTAL, CKMB, CKMBINDEX, TROPONINI in the last 168 hours. BNP: BNP (last 3 results) No results for input(s): BNP in the last 8760 hours.  ProBNP (last 3 results) No results for input(s): PROBNP in the last 8760 hours.  CBG: No results for input(s): GLUCAP in the last 168 hours.     SignedHosie Poisson  [facility] 03/30/2015, 9:25 AM

## 2015-03-30 NOTE — Care Management Note (Signed)
Case Management Note  Patient Details  Name: ABHIJOT STRAUGHTER MRN: [id number] Date of Birth: [birth year]  Subjective/Objective:                    Action/Plan:   Expected Discharge Date:   (unknown)               Expected Discharge Plan:  [place]  In-House Referral:     Discharge planning Services  CM Consult  Post Acute Care Choice:    Choice offered to:     DME Arranged:   (None needed per patient's son) DME Agency:     HH Arranged:  RN, OT, PT, Nurse's Aide (social work) [place]:  [place]  Status of Service:  Completed, signed off  Medicare Important Message Given:  Yes Date Medicare IM Given:  03/29/15 Medicare IM give by:  Marney Doctor RN, BSN,NCM Date Additional Medicare IM Given:    Additional Medicare Important Message give by:     If discussed at [place] of Stay Meetings, dates discussed:    Additional CommentsLivia Snellen, RN 03/30/2015, 8:01 PM

## 2015-03-30 NOTE — Progress Notes (Signed)
PATIENT DETAILS Name: Ricky [name] Age: 81 y.o. Sex: male Date of Birth: Jun 15, 1917 Admit Date: 03/25/2015 Admitting Physician Theodis Blaze, MD ZOX:[id number], TIFFANY, DO  Subjective: Pleasantly confused-mumbles incoherently. No new complaints. Does not appear to be in any distress.   Assessment/Plan: Active Problems:   Acute encephalopathy: Multifactorial-possibly secondary to pseudomonal UTI, dehydration and ongoing shingles.Calm-mostly quiet-mumbles at times- Not lethargic-follows commands at times only. Suspect not far from usual baseline.     Pseudomonal UTI: Continue ciprofloxacin. Afebrile.     Zoster: Continue Valtrex.    Hypothyroidism: Continue with levothyroxine     Hypertension: Controlled, continue with hydralazine    Anemia: Hemoglobin stable, continue to follow periodically     Alzheimer's dementia: Not sure what usual baseline is-but suspect not far from baseline-continue with Namenda    Severe protein calorie malnutrition: Continue supplements  Disposition: Patient's son wants to take the patient home tomorrow.   Antimicrobial agents  See below  Anti-infectives    Start     Dose/Rate Route Frequency Ordered Stop   03/30/15 0000  ciprofloxacin (CIPRO) 500 MG tablet  Status:  Discontinued     500 mg Oral 2 times daily 03/30/15 0925 03/30/15    03/30/15 0000  valACYclovir (VALTREX) 1000 MG tablet     1,000 mg Oral 2 times daily 03/30/15 0925     03/30/15 0000  ciprofloxacin (CIPRO) 500 MG tablet     500 mg Oral 2 times daily 03/30/15 1004     03/29/15 2000  ciprofloxacin (CIPRO) tablet 500 mg     500 mg Oral 2 times daily 03/29/15 1049     03/27/15 1700  ciprofloxacin (CIPRO) IVPB 400 mg  Status:  Discontinued     400 mg 200 mL/hr over 60 Minutes Intravenous Every 12 hours 03/27/15 1617 03/29/15 1049   03/25/15 1900  valACYclovir (VALTREX) tablet 1,000 mg  Status:  Discontinued     1,000 mg Oral 3 times daily 03/25/15 1711 03/25/15 1840    03/25/15 1900  valACYclovir (VALTREX) tablet 1,000 mg     1,000 mg Oral 2 times daily 03/25/15 1840     03/25/15 1700  cefTRIAXone (ROCEPHIN) 1 g in dextrose 5 % 50 mL IVPB  Status:  Discontinued     1 g 100 mL/hr over 30 Minutes Intravenous Every 24 hours 03/25/15 1649 03/25/15 1800   03/25/15 1530  cefTRIAXone (ROCEPHIN) 1 g in dextrose 5 % 50 mL IVPB  Status:  Discontinued     1 g 100 mL/hr over 30 Minutes Intravenous Every 24 hours 03/25/15 1516 03/27/15 1614      DVT Prophylaxis: Prophylactic Lovenox   Code Status: Full code  Family Communication Son-over the phone  Procedures: None  CONSULTS:  None  Time spent 35 minutes-Greater than 50% of this time was spent in counseling,planning of further management, and coordination of care.  MEDICATIONS: Scheduled Meds: . acetaminophen  1,000 mg Oral TID  . aspirin EC  325 mg Oral QPM  . calcium-vitamin D  1 tablet Oral BID WC  . ciprofloxacin  500 mg Oral BID  . enoxaparin (LOVENOX) injection  40 mg Subcutaneous Q24H  . ferrous sulfate  325 mg Oral QODAY  . hydrALAZINE  10 mg Oral 3 times per day  . lactose free nutrition  90 mL Oral TID BM  . levothyroxine  25 mcg Oral QAC breakfast  . memantine  10 mg  Oral BID  . valACYclovir  1,000 mg Oral BID   Continuous Infusions: . sodium chloride 20 mL/hr at 03/29/15 2330   PRN Meds:.guaiFENesin, hydrALAZINE, polyethylene glycol    PHYSICAL EXAM: Vital signs in last 24 hours: Filed Vitals:   03/29/15 1421 03/29/15 2104 03/29/15 2345 03/30/15 0537  BP: 154/84 154/60  146/62  Pulse: 60 54 58 53  Temp: 97.9 F (36.6 C) 98.2 F (36.8 C)  97.9 F (36.6 C)  TempSrc: Axillary Oral  Oral  Resp: 18 18  18   Height:      Weight:      SpO2: 100% 99%  100%    Weight change:  Filed Weights   03/25/15 1834  Weight: 52.164 kg (115 lb)   Body mass index is 16.5 kg/(m^2).   Gen Exam: Awake and alert Neck: Supple, No JVD.   Chest: B/L Clear.   CVS: S1 S2 Regular, no  murmurs.  Abdomen: soft, BS +, non tender, non distended.  Extremities: no edema, lower extremities warm to touch. Skin: No Rash.   Wounds: N/A.   Intake/Output from previous day:  Intake/Output Summary (Last 24 hours) at 03/30/15 1411 Last data filed at 03/30/15 1059  Gross per 24 hour  Intake   1705 ml  Output   1100 ml  Net    605 ml     LAB RESULTS: CBC  Recent Labs Lab 03/25/15 1520 03/26/15 0425 03/29/15 0351  WBC 7.7 5.4 4.8  HGB 13.0 11.2* 10.9*  HCT 40.9 34.7* 34.9*  PLT 213 195 192  MCV 89.5 89.0 89.5  MCH 28.4 28.7 27.9  MCHC 31.8 32.3 31.2  RDW 14.0 14.1 14.2  LYMPHSABS 1.1  --   --   MONOABS 0.4  --   --   EOSABS 0.0  --   --   BASOSABS 0.0  --   --     Chemistries   Recent Labs Lab 03/25/15 1520 03/26/15 0425 03/29/15 0351  NA 137 138 137  K 4.2 3.6 3.5  CL 101 107 103  CO2 27 23 27   GLUCOSE 100* 80 84  BUN 24* 18 6  CREATININE 1.14 0.71 0.57*  CALCIUM 9.4 8.8* 8.5*    CBG: No results for input(s): GLUCAP in the last 168 hours.  GFR Estimated Creatinine Clearance: 40.8 mL/min (by C-G formula based on Cr of 0.57).  Coagulation profile No results for input(s): INR, PROTIME in the last 168 hours.  Cardiac Enzymes No results for input(s): CKMB, TROPONINI, MYOGLOBIN in the last 168 hours.  Invalid input(s): CK  Invalid input(s): POCBNP No results for input(s): DDIMER in the last 72 hours. No results for input(s): HGBA1C in the last 72 hours. No results for input(s): CHOL, HDL, LDLCALC, TRIG, CHOLHDL, LDLDIRECT in the last 72 hours. No results for input(s): TSH, T4TOTAL, T3FREE, THYROIDAB in the last 72 hours.  Invalid input(s): FREET3 No results for input(s): VITAMINB12, FOLATE, FERRITIN, TIBC, IRON, RETICCTPCT in the last 72 hours. No results for input(s): LIPASE, AMYLASE in the last 72 hours.  Urine Studies No results for input(s): UHGB, CRYS in the last 72 hours.  Invalid input(s): UACOL, UAPR, USPG, UPH, UTP, UGL, UKET,  UBIL, UNIT, UROB, ULEU, UEPI, UWBC, URBC, UBAC, CAST, UCOM, BILUA  MICROBIOLOGY: Recent Results (from the past 240 hour(s))  Urine culture     Status: None   Collection Time: 03/25/15  2:26 PM  Result Value Ref Range Status   Specimen Description URINE, CATHETERIZED  Final  Special Requests NONE  Final   Colony Count   Final    >=100,000 COLONIES/ML Performed at [facility]    Culture   Final    PSEUDOMONAS AERUGINOSA Performed at [facility]    Report Status 03/27/2015 FINAL  Final   Organism ID, Bacteria PSEUDOMONAS AERUGINOSA  Final      Susceptibility   Pseudomonas aeruginosa - MIC*    CEFEPIME 8 SENSITIVE Sensitive     CEFTAZIDIME 4 SENSITIVE Sensitive     CIPROFLOXACIN 1 SENSITIVE Sensitive     GENTAMICIN 2 SENSITIVE Sensitive     IMIPENEM 2 SENSITIVE Sensitive     PIP/TAZO 32 SENSITIVE Sensitive     TOBRAMYCIN <=1 SENSITIVE Sensitive     * PSEUDOMONAS AERUGINOSA    RADIOLOGY STUDIES/RESULTS: Dg Chest Port 1 View  03/25/2015   CLINICAL DATA:  Syncopal episode  EXAM: PORTABLE CHEST - 1 VIEW  COMPARISON:  01/10/2015  FINDINGS: Examination is somewhat limited secondary to the patient's inability to adequately position herself. The cardiac shadow is mildly enlarged but stable. The lungs are well aerated bilaterally. The previously seen nodular densities are not as well appreciated on today's exam. No focal confluent infiltrate is seen.  IMPRESSION: Limited exam without acute abnormality.   Electronically Signed   By: Inez Catalina M.D.   On: 03/25/2015 15:14    Jyron Turman, MD  [facility] Pager:[phone]  If 7PM-7AM, please contact night-coverage [email or web address] Password [email or web address] 03/30/2015, 2:11 PM   LOS: 5 days

## 2015-03-30 NOTE — Progress Notes (Signed)
[facility] received phone call from charge RN Maudie Mercury regarding home health services.  Per Maudie Mercury, patient's family would like to have [facility] home health services again.  Patient to be discharged home to family.  EDCM instructed charge RN to call Dr. Karleen Hampshire to place home health orders for RN, PT, OT, aide and social worker with a face to face.  Orders placed by Dr. Karleen Hampshire.  EDCM called and spoke to patient's son [facility] who reports their choice is [place].  Per patient's son, patient does not require further dme at home.  EDCM informed patient's son that [facility] will have 24-48 hours to contact them for services.  Patient's son thankful for services.  [facility] informed charge RN that son has been notified of home health services with [facility].  [facility] faxed home health orders to [facility] at 1940pm with confirmation of receipt at 1946pm.  No further EDCM needs at this time.

## 2015-03-30 NOTE — Progress Notes (Signed)
Pt's son arrived to unit to pick up pt. IV removed, pt in stable condition. Went over d/c instructions with pt's son. Pt d/c'd to home with son.

## 2015-03-31 ENCOUNTER — Emergency Department (HOSPITAL_COMMUNITY): Payer: Medicare Other

## 2015-03-31 ENCOUNTER — Other Ambulatory Visit: Payer: Self-pay

## 2015-03-31 ENCOUNTER — Encounter (HOSPITAL_COMMUNITY): Payer: Self-pay | Admitting: Emergency Medicine

## 2015-03-31 ENCOUNTER — Inpatient Hospital Stay (HOSPITAL_COMMUNITY)
Admission: EM | Admit: 2015-03-31 | Discharge: 2015-04-13 | DRG: 091 | Disposition: A | Discharge status: Home Health | Payer: Medicare Other | Attending: Internal Medicine | Admitting: Internal Medicine

## 2015-03-31 DIAGNOSIS — I129 Hypertensive chronic kidney disease with stage 1 through stage 4 chronic kidney disease, or unspecified chronic kidney disease: Secondary | ICD-10-CM | POA: Diagnosis present

## 2015-03-31 DIAGNOSIS — B029 Zoster without complications: Secondary | ICD-10-CM | POA: Diagnosis present

## 2015-03-31 DIAGNOSIS — Z7982 Long term (current) use of aspirin: Secondary | ICD-10-CM | POA: Diagnosis not present

## 2015-03-31 DIAGNOSIS — M109 Gout, unspecified: Secondary | ICD-10-CM | POA: Diagnosis present

## 2015-03-31 DIAGNOSIS — G231 Progressive supranuclear ophthalmoplegia [Steele-Richardson-Olszewski]: Secondary | ICD-10-CM | POA: Diagnosis present

## 2015-03-31 DIAGNOSIS — A419 Sepsis, unspecified organism: Secondary | ICD-10-CM | POA: Diagnosis present

## 2015-03-31 DIAGNOSIS — F0280 Dementia in other diseases classified elsewhere without behavioral disturbance: Secondary | ICD-10-CM | POA: Diagnosis present

## 2015-03-31 DIAGNOSIS — N3281 Overactive bladder: Secondary | ICD-10-CM | POA: Diagnosis present

## 2015-03-31 DIAGNOSIS — G309 Alzheimer's disease, unspecified: Secondary | ICD-10-CM | POA: Diagnosis present

## 2015-03-31 DIAGNOSIS — Z91011 Allergy to milk products: Secondary | ICD-10-CM | POA: Diagnosis not present

## 2015-03-31 DIAGNOSIS — Z79899 Other long term (current) drug therapy: Secondary | ICD-10-CM

## 2015-03-31 DIAGNOSIS — Z823 Family history of stroke: Secondary | ICD-10-CM | POA: Diagnosis not present

## 2015-03-31 DIAGNOSIS — M810 Age-related osteoporosis without current pathological fracture: Secondary | ICD-10-CM | POA: Diagnosis present

## 2015-03-31 DIAGNOSIS — H3530 Unspecified macular degeneration: Secondary | ICD-10-CM | POA: Diagnosis present

## 2015-03-31 DIAGNOSIS — E43 Unspecified severe protein-calorie malnutrition: Secondary | ICD-10-CM | POA: Diagnosis present

## 2015-03-31 DIAGNOSIS — E860 Dehydration: Secondary | ICD-10-CM | POA: Diagnosis present

## 2015-03-31 DIAGNOSIS — R1310 Dysphagia, unspecified: Secondary | ICD-10-CM | POA: Diagnosis present

## 2015-03-31 DIAGNOSIS — Z7189 Other specified counseling: Secondary | ICD-10-CM

## 2015-03-31 DIAGNOSIS — R627 Adult failure to thrive: Secondary | ICD-10-CM | POA: Diagnosis present

## 2015-03-31 DIAGNOSIS — D638 Anemia in other chronic diseases classified elsewhere: Secondary | ICD-10-CM | POA: Diagnosis present

## 2015-03-31 DIAGNOSIS — B965 Pseudomonas (aeruginosa) (mallei) (pseudomallei) as the cause of diseases classified elsewhere: Secondary | ICD-10-CM | POA: Diagnosis present

## 2015-03-31 DIAGNOSIS — R532 Functional quadriplegia: Secondary | ICD-10-CM | POA: Diagnosis present

## 2015-03-31 DIAGNOSIS — E039 Hypothyroidism, unspecified: Secondary | ICD-10-CM | POA: Diagnosis present

## 2015-03-31 DIAGNOSIS — K589 Irritable bowel syndrome without diarrhea: Secondary | ICD-10-CM | POA: Diagnosis present

## 2015-03-31 DIAGNOSIS — R1314 Dysphagia, pharyngoesophageal phase: Secondary | ICD-10-CM

## 2015-03-31 DIAGNOSIS — L8990 Pressure ulcer of unspecified site, unspecified stage: Secondary | ICD-10-CM | POA: Diagnosis present

## 2015-03-31 DIAGNOSIS — Z825 Family history of asthma and other chronic lower respiratory diseases: Secondary | ICD-10-CM

## 2015-03-31 DIAGNOSIS — G929 Unspecified toxic encephalopathy: Secondary | ICD-10-CM | POA: Diagnosis present

## 2015-03-31 DIAGNOSIS — M1990 Unspecified osteoarthritis, unspecified site: Secondary | ICD-10-CM | POA: Diagnosis present

## 2015-03-31 DIAGNOSIS — E876 Hypokalemia: Secondary | ICD-10-CM | POA: Diagnosis present

## 2015-03-31 DIAGNOSIS — Z809 Family history of malignant neoplasm, unspecified: Secondary | ICD-10-CM

## 2015-03-31 DIAGNOSIS — Z66 Do not resuscitate: Secondary | ICD-10-CM | POA: Diagnosis present

## 2015-03-31 DIAGNOSIS — N182 Chronic kidney disease, stage 2 (mild): Secondary | ICD-10-CM | POA: Diagnosis present

## 2015-03-31 DIAGNOSIS — R4189 Other symptoms and signs involving cognitive functions and awareness: Secondary | ICD-10-CM | POA: Diagnosis present

## 2015-03-31 DIAGNOSIS — N179 Acute kidney failure, unspecified: Secondary | ICD-10-CM | POA: Diagnosis present

## 2015-03-31 DIAGNOSIS — G92 Toxic encephalopathy: Principal | ICD-10-CM | POA: Diagnosis present

## 2015-03-31 DIAGNOSIS — G9340 Encephalopathy, unspecified: Secondary | ICD-10-CM | POA: Diagnosis present

## 2015-03-31 DIAGNOSIS — Z87891 Personal history of nicotine dependence: Secondary | ICD-10-CM

## 2015-03-31 DIAGNOSIS — R404 Transient alteration of awareness: Secondary | ICD-10-CM

## 2015-03-31 DIAGNOSIS — R4182 Altered mental status, unspecified: Secondary | ICD-10-CM | POA: Diagnosis present

## 2015-03-31 DIAGNOSIS — I10 Essential (primary) hypertension: Secondary | ICD-10-CM

## 2015-03-31 DIAGNOSIS — G20 Parkinson's disease: Secondary | ICD-10-CM | POA: Diagnosis present

## 2015-03-31 DIAGNOSIS — Z681 Body mass index (BMI) 19 or less, adult: Secondary | ICD-10-CM

## 2015-03-31 DIAGNOSIS — N390 Urinary tract infection, site not specified: Secondary | ICD-10-CM

## 2015-03-31 DIAGNOSIS — Z8673 Personal history of transient ischemic attack (TIA), and cerebral infarction without residual deficits: Secondary | ICD-10-CM

## 2015-03-31 DIAGNOSIS — Z515 Encounter for palliative care: Secondary | ICD-10-CM

## 2015-03-31 LAB — CBC WITH DIFFERENTIAL/PLATELET
BASOS ABS: 0 10*3/uL (ref 0.0–0.1)
Basophils Relative: 0 % (ref 0–1)
Eosinophils Absolute: 0 10*3/uL (ref 0.0–0.7)
Eosinophils Relative: 0 % (ref 0–5)
HCT: 38.1 % — ABNORMAL LOW (ref 39.0–52.0)
Hemoglobin: 12.3 g/dL — ABNORMAL LOW (ref 13.0–17.0)
Lymphocytes Relative: 15 % (ref 12–46)
Lymphs Abs: 1 10*3/uL (ref 0.7–4.0)
MCH: 29 pg (ref 26.0–34.0)
MCHC: 32.3 g/dL (ref 30.0–36.0)
MCV: 89.9 fL (ref 78.0–100.0)
MONO ABS: 0.4 10*3/uL (ref 0.1–1.0)
Monocytes Relative: 5 % (ref 3–12)
Neutro Abs: 5.4 10*3/uL (ref 1.7–7.7)
Neutrophils Relative %: 80 % — ABNORMAL HIGH (ref 43–77)
PLATELETS: 212 10*3/uL (ref 150–400)
RBC: 4.24 MIL/uL (ref 4.22–5.81)
RDW: 14.3 % (ref 11.5–15.5)
WBC: 6.7 10*3/uL (ref 4.0–10.5)

## 2015-03-31 LAB — I-STAT CG4 LACTIC ACID, ED
LACTIC ACID, VENOUS: 3.25 mmol/L — AB (ref 0.5–2.0)
LACTIC ACID, VENOUS: 2.02 mmol/L — AB (ref 0.5–2.0)

## 2015-03-31 LAB — COMPREHENSIVE METABOLIC PANEL
ALBUMIN: 3.5 g/dL (ref 3.5–5.0)
ALK PHOS: 65 U/L (ref 38–126)
ALT: 19 U/L (ref 17–63)
AST: 31 U/L (ref 15–41)
Anion gap: 10 (ref 5–15)
BILIRUBIN TOTAL: 0.6 mg/dL (ref 0.3–1.2)
BUN: 16 mg/dL (ref 6–20)
CHLORIDE: 99 mmol/L — AB (ref 101–111)
CO2: 27 mmol/L (ref 22–32)
Calcium: 9.4 mg/dL (ref 8.9–10.3)
Creatinine, Ser: 1.06 mg/dL (ref 0.61–1.24)
GFR calc Af Amer: >60 mL/min (ref >60)
GFR, EST NON AFRICAN AMERICAN: 58 mL/min — AB (ref >60)
Glucose, Bld: 101 mg/dL — ABNORMAL HIGH (ref 65–99)
Potassium: 4.3 mmol/L (ref 3.5–5.1)
Sodium: 136 mmol/L (ref 135–145)
Total Protein: 7.5 g/dL (ref 6.5–8.1)

## 2015-03-31 LAB — URINE MICROSCOPIC-ADD ON

## 2015-03-31 LAB — URINALYSIS, ROUTINE W REFLEX MICROSCOPIC
BILIRUBIN URINE: NEGATIVE
GLUCOSE, UA: NEGATIVE mg/dL
KETONES UR: NEGATIVE mg/dL
Nitrite: NEGATIVE
PH: 6 (ref 5.0–8.0)
PROTEIN: 30 mg/dL — AB
SPECIFIC GRAVITY, URINE: 1.021 (ref 1.005–1.030)
Urobilinogen, UA: 1 mg/dL (ref 0.0–1.0)

## 2015-03-31 LAB — TROPONIN I: Troponin I: 0.05 ng/mL — ABNORMAL HIGH (ref <0.031)

## 2015-03-31 MED ORDER — DEXTROSE 5 % IV SOLN
2.0000 g | Freq: Once | INTRAVENOUS | Status: AC
  Administered 2015-03-31: 2 g via INTRAVENOUS
  Filled 2015-03-31: qty 2

## 2015-03-31 MED ORDER — DEXTROSE 5 % IV SOLN
2.0000 g | INTRAVENOUS | Status: DC
  Administered 2015-04-01 – 2015-04-02 (×2): 2 g via INTRAVENOUS
  Filled 2015-03-31 (×2): qty 2

## 2015-03-31 MED ORDER — ASPIRIN 300 MG RE SUPP
300.0000 mg | Freq: Once | RECTAL | Status: AC
  Administered 2015-03-31: 300 mg via RECTAL
  Filled 2015-03-31: qty 1

## 2015-03-31 MED ORDER — SODIUM CHLORIDE 0.9 % IV BOLUS (SEPSIS)
500.0000 mL | Freq: Once | INTRAVENOUS | Status: AC
  Administered 2015-03-31: 500 mL via INTRAVENOUS

## 2015-03-31 MED ORDER — SODIUM CHLORIDE 0.9 % IV BOLUS (SEPSIS)
1000.0000 mL | INTRAVENOUS | Status: AC
  Administered 2015-03-31 (×2): 1000 mL via INTRAVENOUS

## 2015-03-31 MED ORDER — SODIUM CHLORIDE 0.9 % IV SOLN: Freq: Once | INTRAVENOUS | Status: AC

## 2015-03-31 MED ORDER — VANCOMYCIN HCL IN DEXTROSE 750-5 MG/150ML-% IV SOLN
750.0000 mg | Freq: Every day | INTRAVENOUS | Status: DC
  Administered 2015-03-31 – 2015-04-02 (×3): 750 mg via INTRAVENOUS
  Filled 2015-03-31 (×3): qty 150

## 2015-03-31 NOTE — ED Notes (Signed)
I tried getting patient blood and was unsuccessful.

## 2015-03-31 NOTE — ED Notes (Signed)
Pt from home via EMS- Per EMS, pt was seen last pm at this facility for shingles and UTI. Pt family called EMS and reported that pt took a nap and realized that mental status changed and was not as responsive as usual. Pt family does not know when his last normal seen normal is. Pt has hx of AMS. Pt in NAD and VSS

## 2015-03-31 NOTE — Progress Notes (Addendum)
ANTIBIOTIC CONSULT NOTE - INITIAL  Pharmacy Consult for cefepime/vanc Indication: urosepsis  Allergies  Allergen Reactions  . Other Other (See Comments)    Dairy products- cause swallowing difficulty     Patient Measurements:     Vital Signs: Temp: 98.7 F (37.1 C) (05/14 1911) Temp Source: Rectal (05/14 1911) BP: 138/81 mmHg (05/14 1837) Pulse Rate: 73 (05/14 1837) Intake/Output from previous day:   Intake/Output from this shift:    Labs:  Recent Labs  03/29/15 0351 03/31/15 2020  WBC 4.8 6.7  HGB 10.9* 12.3*  PLT 192 212  CREATININE 0.57*  --    Estimated Creatinine Clearance: 40.8 mL/min (by C-G formula based on Cr of 0.57). No results for input(s): VANCOTROUGH, VANCOPEAK, VANCORANDOM, GENTTROUGH, GENTPEAK, GENTRANDOM, TOBRATROUGH, TOBRAPEAK, TOBRARND, AMIKACINPEAK, AMIKACINTROU, AMIKACIN in the last 72 hours.   Microbiology: Recent Results (from the past 720 hour(s))  Urine culture     Status: None   Collection Time: 03/25/15  2:26 PM  Result Value Ref Range Status   Specimen Description URINE, CATHETERIZED  Final   Special Requests NONE  Final   Colony Count   Final    >=100,000 COLONIES/ML Performed at [facility]    Culture   Final    PSEUDOMONAS AERUGINOSA Performed at [facility]    Report Status 03/27/2015 FINAL  Final   Organism ID, Bacteria PSEUDOMONAS AERUGINOSA  Final      Susceptibility   Pseudomonas aeruginosa - MIC*    CEFEPIME 8 SENSITIVE Sensitive     CEFTAZIDIME 4 SENSITIVE Sensitive     CIPROFLOXACIN 1 SENSITIVE Sensitive     GENTAMICIN 2 SENSITIVE Sensitive     IMIPENEM 2 SENSITIVE Sensitive     PIP/TAZO 32 SENSITIVE Sensitive     TOBRAMYCIN <=1 SENSITIVE Sensitive     * PSEUDOMONAS AERUGINOSA    Medical History: Past Medical History  Diagnosis Date  . IBS (irritable bowel syndrome)   . Diverticulosis   . Cataract   . OAB (overactive bladder)   . Macular degeneration   . Alzheimer disease   .  Hypertension   . Arthritis   . Osteoporosis   . Parkinsonian syndrome   . Syncope, vasovagal   . Neuromuscular disorder     parkisonian syndrome  . 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 Unspecified transient cerebral ischemia   . Unspecified urinary incontinence   . Senile dementia, uncomplicated   . Unspecified constipation   . Hypothyroidism   . Anemia, unspecified   . Depression   . Alzheimer's disease   . Gout, unspecified   . Thoracic or lumbosacral neuritis or radiculitis, unspecified   . Progressive supranuclear palsy   . Unspecified transient cerebral ischemia   . Unspecified urinary incontinence   . Syncope and collapse   . Paralysis agitans   . Unspecified transient cerebral ischemia   . Pneumonitis due to inhalation of food or vomitus   . Cellulitis and abscess of oral soft tissues   . Screening for lipoid disorders   . Senile dementia, uncomplicated   . Unspecified constipation   . Disorder of bone and cartilage, unspecified   . Other specified acquired hypothyroidism   . Unspecified hypothyroidism   . Anemia, unspecified   . Depressive disorder, not elsewhere classified   . Alzheimer's disease   . Paralysis agitans   . Pneumonia, organism unspecified   . Pseudomonas infection in conditions classified elsewhere and of unspecified site   . Unspecified  essential hypertension   . Urinary tract infection, site not specified   . Osteoarthrosis, unspecified whether generalized or localized, unspecified site   . Osteoporosis, unspecified   . Gout, unspecified   . Thoracic or lumbosacral neuritis or radiculitis, unspecified   . Stroke     Medications:  Scheduled:   Infusions:  . sodium chloride    . ceFEPime (MAXIPIME) IV    . sodium chloride     Assessment: 81 yo male presents to ER with AMS after just being discharged 5/13 and treated for pseudomonas UTI, dehydration and shingles. Patient with advanced dementia and  catheter in place per notes. To start cefepime per pharmacy dosing. Note that 5/8 urine culture grew pseudomonas that was pansensitive and was treated with Cipro.  Patient also had pseudomonas in urine back in feb as well as enterococcus  Goal of Therapy:  Vancomycin trough 15-20 treatment of UTI  Plan:  Cefepime 2g IV q24 for CrCl 30-60ml/min and presumed severe UTI Vancomycin 750mg  iv q24hr   Adrian Saran, PharmD, BCPS Pager [phone] 03/31/2015 9:03 PM

## 2015-03-31 NOTE — H&P (Signed)
[facility] Admission History and Physical       Ricky [name] EYC:[id number] DOB: 09-24-1917 DOA: 03/31/2015  Referring physician: EDP PCP: Hollace Kinnier, DO  Specialists:   Chief Complaint: Decreased LOC  HPI: Ricky [name] is a 81 y.o. male with a history of Progressive Supranuclear Palsy HTN, Hypothyroid who was inpatient from May 8 through May 13 for UTI and Shingles and was brought to the ED due to increased lethargy and decreased LOC over the past 24 hours.   He was discharge home on Cipro for a Pseudomonas UTI.   His Son gives the history and reports that he has also had poor intake of foods and liquids today.      Review of Systems: Unable to Obtain from the Patient     Past Medical History  Diagnosis Date  . IBS (irritable bowel syndrome)   . Diverticulosis   . Cataract   . OAB (overactive bladder)   . Macular degeneration   . Alzheimer disease   . Hypertension   . Arthritis   . Osteoporosis   . Parkinsonian syndrome   . Syncope, vasovagal   . Neuromuscular disorder     parkisonian syndrome  . 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 Unspecified transient cerebral ischemia   . Unspecified urinary incontinence   . Senile dementia, uncomplicated   . Unspecified constipation   . Hypothyroidism   . Anemia, unspecified   . Depression   . Alzheimer's disease   . Gout, unspecified   . Thoracic or lumbosacral neuritis or radiculitis, unspecified   . Progressive supranuclear palsy   . Unspecified transient cerebral ischemia   . Unspecified urinary incontinence   . Syncope and collapse   . Paralysis agitans   . Unspecified transient cerebral ischemia   . Pneumonitis due to inhalation of food or vomitus   . Cellulitis and abscess of oral soft tissues   . Screening for lipoid disorders   . Senile dementia, uncomplicated   . Unspecified constipation   . Disorder of bone and cartilage, unspecified   . Other specified  acquired hypothyroidism   . Unspecified hypothyroidism   . Anemia, unspecified   . Depressive disorder, not elsewhere classified   . Alzheimer's disease   . Paralysis agitans   . Pneumonia, organism unspecified   . Pseudomonas infection in conditions classified elsewhere and of unspecified site   . Unspecified essential hypertension   . Urinary tract infection, site not specified   . Osteoarthrosis, unspecified whether generalized or localized, unspecified site   . Osteoporosis, unspecified   . Gout, unspecified   . Thoracic or lumbosacral neuritis or radiculitis, unspecified   . Stroke      Past Surgical History  Procedure Laterality Date  . Esophagogastroduodenoscopy    . Eye surgery  2006    LEFT CATARACT      Prior to Admission medications   Medication Sig Start Date End Date Taking? Authorizing Provider  acetaminophen (TYLENOL) 500 MG tablet Take 1,000 mg by mouth 3 (three) times daily.    Yes Historical Provider, MD  AMBULATORY NON FORMULARY MEDICATION Pressure Air Mattress Dx: G60.8, L89.150 10/30/14  Yes Tiffany L Reed, DO  aspirin EC 325 MG tablet Take 325 mg by mouth every evening.   Yes Historical Provider, MD  bacitracin ointment Apply 1 application topically 2 (two) times daily. Applies to head of penis to decrease infection from catheter.   Yes Historical Provider, MD  Calcium Carbonate-Vit  D-Min (CALTRATE 600+D PLUS) 600-800 MG-UNIT CHEW Chew 1 tablet by mouth 2 (two) times daily with a meal.    Yes Historical Provider, MD  ciprofloxacin (CIPRO) 500 MG tablet Take 1 tablet (500 mg total) by mouth 2 (two) times daily. 03/30/15  Yes Hosie Poisson, MD  CRANBERRY SOFT PO Take 2 capsules by mouth 3 (three) times daily.    Yes Historical Provider, MD  Cyanocobalamin (VITAMIN B-12) 5000 MCG SUBL Place 5,000 mcg under the tongue 2 (two) times daily.   Yes Historical Provider, MD  ferrous sulfate 325 (65 FE) MG tablet Take 325 mg by mouth every other day.    Yes Historical  Provider, MD  guaiFENesin (MUCINEX) 600 MG 12 hr tablet Take 600 mg by mouth 2 (two) times daily as needed for cough.   Yes Historical Provider, MD  hydrALAZINE (APRESOLINE) 25 MG tablet Take 1/2 tablet by mouth twice daily to control blood pressure 11/14/14  Yes Mahima Pandey, MD  lactose free nutrition (BOOST) LIQD Take 90 mLs by mouth 3 (three) times daily between meals. Patient taking differently: Take 90 mLs by mouth 2 (two) times daily between meals.  07/01/14  Yes Barton Dubois, MD  levothyroxine (SYNTHROID, LEVOTHROID) 25 MCG tablet TAKE 1 TABLET BY MOUTH DAILY 10/19/14  Yes Tiffany L Reed, DO  lidocaine (LIDODERM) 5 % PLACE 3 PATCHES ONTO THE SKIN DAILY. REMOVE & DISCARD PATCH WITHIN 12 HOURS OR AS DIRECTED BY MD Patient taking differently: PLACE 1-3 PATCHES as needed for pain place ONTO THE SKIN DAILY. REMOVE & DISCARD PATCH WITHIN 12 HOURS OR AS DIRECTED BY MD 02/16/15  Yes Tiffany L Reed, DO  Melatonin 5 MG TABS Take 5 mg by mouth at bedtime as needed (For sleep.).   Yes Historical Provider, MD  memantine (NAMENDA) 10 MG tablet Take 10 mg by mouth 2 (two) times daily.   Yes Historical Provider, MD  Multiple Vitamins-Minerals (ICAPS AREDS FORMULA PO) Take 2 capsules by mouth 2 (two) times daily with a meal.    Yes Historical Provider, MD  Multiple Vitamins-Minerals (MULTIVITAMIN WITH MINERALS) tablet Take 1 tablet by mouth daily.   Yes Historical Provider, MD  polyethylene glycol (MIRALAX / GLYCOLAX) packet Take 17 g by mouth daily as needed for mild constipation. 07/01/14  Yes Barton Dubois, MD  Probiotic Product ([place]) CAPS Take 1 capsule by mouth 2 (two) times daily.   Yes Historical Provider, MD  psyllium (METAMUCIL) 58.6 % packet 2 tablespoons two times daily   Yes Historical Provider, MD  valACYclovir (VALTREX) 1000 MG tablet Take 1 tablet (1,000 mg total) by mouth 2 (two) times daily. 03/30/15  Yes Hosie Poisson, MD  VOLTAREN 1 % GEL Apply 2 g topically 4 (four) times  daily.  07/10/14  Yes Historical Provider, MD     Allergies  Allergen Reactions  . Other Other (See Comments)    Dairy products- cause swallowing difficulty     Social History:  reports that he has quit smoking. He has never used smokeless tobacco. He reports that he does not drink alcohol or use illicit drugs.    Family History  Problem Relation Age of Onset  . Cancer Mother   . Stroke Father   . Liver disease Father   . Kidney disease Brother   . Liver disease Brother   . Emphysema Brother        Physical Exam:  GEN:  Elderly Cachectic Bedbound 81 y.o. African American male examined and in no acute  distress; Filed Vitals:   03/31/15 2200 03/31/15 2230 03/31/15 2249 03/31/15 2300  BP: 137/74 159/70  126/70  Pulse: 69 63  62  Temp:   98 F (36.7 C)   TempSrc:   Rectal   Resp: 23 17  18   Height:      Weight:      SpO2: 94% 91%  95%   Blood pressure 126/70, pulse 62, temperature 98 F (36.7 C), temperature source Rectal, resp. rate 18, height 6\' 2"  (1.88 m), weight 52.164 kg (115 lb), SpO2 95 %. PSYCH: He is alert and oriented x 0;  HEENT: Normocephalic and Atraumatic, Mucous membranes pink; PERRLA; EOM intact; Fundi:  Benign;  No scleral icterus, Nares: Patent, Oropharynx: Clear,     Neck:  FROM, No Cervical Lymphadenopathy nor Thyromegaly or Carotid Bruit; No JVD; Breasts:: Not examined CHEST WALL: No tenderness CHEST: Normal respiration, clear to auscultation bilaterally HEART: Regular rate and rhythm; no murmurs rubs or gallops BACK: No kyphosis or scoliosis; No CVA tenderness ABDOMEN: Positive Bowel Sounds, Scaphoid, Soft Non-Tender, No Rebound or Guarding; No Masses, No Organomegaly Rectal Exam: Not done EXTREMITIES: No Cyanosis, Clubbing, or Edema; No Ulcerations. Genitalia: not examined PULSES: 2+ and symmetric SKIN: Normal hydration no rash or ulceration CNS:  Alert and Oriented x 0,   Generalized Weakness,    Vascular: pulses palpable throughout     Labs on Admission:  Basic Metabolic Panel:  Recent Labs Lab 03/25/15 1520 03/26/15 0425 03/29/15 0351 03/31/15 2020  NA 137 138 137 136  K 4.2 3.6 3.5 4.3  CL 101 107 103 99*  CO2 27 23 27 27   GLUCOSE 100* 80 84 101*  BUN 24* 18 6 16   CREATININE 1.14 0.71 0.57* 1.06  CALCIUM 9.4 8.8* 8.5* 9.4   Liver Function Tests:  Recent Labs Lab 03/31/15 2020  AST 31  ALT 19  ALKPHOS 65  BILITOT 0.6  PROT 7.5  ALBUMIN 3.5   No results for input(s): LIPASE, AMYLASE in the last 168 hours. No results for input(s): AMMONIA in the last 168 hours. CBC:  Recent Labs Lab 03/25/15 1520 03/26/15 0425 03/29/15 0351 03/31/15 2020  WBC 7.7 5.4 4.8 6.7  NEUTROABS 6.2  --   --  5.4  HGB 13.0 11.2* 10.9* 12.3*  HCT 40.9 34.7* 34.9* 38.1*  MCV 89.5 89.0 89.5 89.9  PLT 213 195 192 212   Cardiac Enzymes:  Recent Labs Lab 03/31/15 2020  TROPONINI 0.05*    BNP (last 3 results) No results for input(s): BNP in the last 8760 hours.  ProBNP (last 3 results) No results for input(s): PROBNP in the last 8760 hours.  CBG: No results for input(s): GLUCAP in the last 168 hours.  Radiological Exams on Admission: Ct Head Wo Contrast  03/31/2015   CLINICAL DATA:  Mental status change.  Not as responsive.  EXAM: CT HEAD WITHOUT CONTRAST  TECHNIQUE: Contiguous axial images were obtained from the base of the skull through the vertex without intravenous contrast.  COMPARISON:  Head CT 04/12/2014  FINDINGS: Patient is extremely kyphotic therefore non standard orientation was scanned. The generates bone artifact.  No acute intracranial hemorrhage. No focal mass lesion. No CT evidence of acute infarction. No midline shift or mass effect. No hydrocephalus. Basilar cisterns are patent.  Generalized cortical atrophy and ventricular dilatation.  Paranasal sinuses and mastoid air cells are clear. Chronic opacification of the sphenoid sinus.  IMPRESSION: 1. No acute intracranial findings. Nonstandard  positioning does limit evaluation. 2. Chronic  atrophy and microvascular disease. 3. Chronic opacification of the sphenoid sinuses air cells   Electronically Signed   By: Suzy Bouchard M.D.   On: 03/31/2015 20:35   Dg Chest Portable 1 View  03/31/2015   CLINICAL DATA:  Recent discharge from hospital for shingles an urinary tract infection. Altered mental status.  EXAM: PORTABLE CHEST - 1 VIEW  COMPARISON:  03/25/2015 and 01/10/2015  FINDINGS: Patient is rotated to the left with chin/head overlying the left apex. Lungs are adequately inflated without consolidation or effusion. Small nodular opacity over the right midlung and left midlung unchanged. Mild stable cardiomegaly. Mild calcified plaque over the aortic arch. Remainder of the exam is unchanged.  IMPRESSION: No acute cardiopulmonary disease.  Stable small bilateral nodules unchanged as agree with previous recommendation of chest CT.  Mild cardiomegaly.   Electronically Signed   By: Marin Olp M.D.   On: 03/31/2015 19:30     EKG: Independently reviewed. Sinus rhythm rate =61 +RBBB, and Diffuse Arifact      Assessment/Plan:   81 y.o. male with  Active Problems:   1.    Sepsis   Sepsis Work up   IV Vancomycin and Cefepime   IVFs    2.    Acute encephalopathy- Due to #1 or Disease (Flare #5)   NPO while decreased alertness    3.    UTI (lower urinary tract infection)   Covered by Abxs   Adjust PRN Culture Results   Foley Change in ED     4.    Shingles   Continue Lidoderm Patches   IV Acyclovir while NPO     5.    Supranuclear palsies, progressive   Chronic, with ? Acute Flare     6.    Hypothyroidism   Change Levothyroxine to IV while NPO     7.    Hypertension   IV Hydralazine PRN while NPO     8.    FTT (failure to thrive) in adult     9.    Dysphagia- due to #5    10.   CKD (chronic kidney disease), stage II   Monitor BUN/Cr    11.   Anemia, chronic disease   Monitor Hb    12.   DVT  Prophylaxis   Lovenox           Code Status:     FULL CODE        Family Communication:   Son at Bedside    Disposition Plan:    Inpatient Status        Time spent: [facility] Pager [phone]   If [place] Please Contact the Day Rounding Team MD for [facility]  If 7PM-7AM, Please Contact Night-Floor Coverage  [email or web address] Password [facility] 03/31/2015, 11:44 PM     ADDENDUM:   Patient was seen and examined on 03/31/2015

## 2015-03-31 NOTE — ED Notes (Signed)
Critical Istat reported to Dr. Claudine Mouton

## 2015-03-31 NOTE — ED Notes (Signed)
Bedside report received from previous RN, Manuela Schwartz. Phlebotomy at bedside trying to complete blood draw. Attempted by previous RN w/ IV start but unsuccessful.

## 2015-03-31 NOTE — ED Notes (Signed)
Attempted to draw labs with IV start. Blood unable to be drawn from IV. Phlebotomy notified

## 2015-03-31 NOTE — ED Notes (Signed)
I walked blood down to lab, per lab unable to run test due to blood clotting.  Notified nurse and Cytogeneticist.

## 2015-03-31 NOTE — ED Notes (Signed)
Bed: DK44 Expected date:  Expected time:  Means of arrival:  Comments: [facility],d/c last pm from this facility

## 2015-03-31 NOTE — ED Provider Notes (Signed)
CSN: [id number]     Arrival date & time 03/31/15  1813 History   First MD Initiated Contact with Patient 03/31/15 1816     Chief Complaint  Patient presents with  . Altered Mental Status     (Consider location/radiation/quality/duration/timing/severity/associated sxs/prior Treatment) The history is provided by a relative and medical records.     Patient with multiple medical problems including advanced dementia and recent admission 03/25/15-03/30/15 with pseudomonas UTI, dehydration, shingles.  Pt d/c home last night.  Per son, pt ate and drank this morning, took all of his medications, later took a nap in his chair and when son when to check on him and help him change positions (to prevent decubitus ulcers), pt was less responsive than normal.  Son states pt has not been talking as much as he normally would since his last hospitalization.  Son is concerned because he thinks that during the last admission patient's catheter was not changed out despite the dx of UTI.    Pt unable to contribute to history.   Level V caveat for dementia.    Past Medical History  Diagnosis Date  . IBS (irritable bowel syndrome)   . Diverticulosis   . Cataract   . OAB (overactive bladder)   . Macular degeneration   . Alzheimer disease   . Hypertension   . Arthritis   . Osteoporosis   . Parkinsonian syndrome   . Syncope, vasovagal   . Neuromuscular disorder     parkisonian syndrome  . 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 Unspecified transient cerebral ischemia   . Unspecified urinary incontinence   . Senile dementia, uncomplicated   . Unspecified constipation   . Hypothyroidism   . Anemia, unspecified   . Depression   . Alzheimer's disease   . Gout, unspecified   . Thoracic or lumbosacral neuritis or radiculitis, unspecified   . Progressive supranuclear palsy   . Unspecified transient cerebral ischemia   . Unspecified urinary incontinence   . Syncope and  collapse   . Paralysis agitans   . Unspecified transient cerebral ischemia   . Pneumonitis due to inhalation of food or vomitus   . Cellulitis and abscess of oral soft tissues   . Screening for lipoid disorders   . Senile dementia, uncomplicated   . Unspecified constipation   . Disorder of bone and cartilage, unspecified   . Other specified acquired hypothyroidism   . Unspecified hypothyroidism   . Anemia, unspecified   . Depressive disorder, not elsewhere classified   . Alzheimer's disease   . Paralysis agitans   . Pneumonia, organism unspecified   . Pseudomonas infection in conditions classified elsewhere and of unspecified site   . Unspecified essential hypertension   . Urinary tract infection, site not specified   . Osteoarthrosis, unspecified whether generalized or localized, unspecified site   . Osteoporosis, unspecified   . Gout, unspecified   . Thoracic or lumbosacral neuritis or radiculitis, unspecified   . Stroke    Past Surgical History  Procedure Laterality Date  . Esophagogastroduodenoscopy    . Eye surgery  2006    LEFT CATARACT   Family History  Problem Relation Age of Onset  . Cancer Mother   . Stroke Father   . Liver disease Father   . Kidney disease Brother   . Liver disease Brother   . Emphysema Brother    History  Substance Use Topics  . Smoking status: Former Research scientist (life sciences)  . Smokeless tobacco:  Never Used     Comment: QUIT SMOKING BACK IN THE LATE 50'S  . Alcohol Use: No    Review of Systems  Unable to perform ROS: Dementia      Allergies  Other  Home Medications   Prior to Admission medications   Medication Sig Start Date End Date Taking? Authorizing Provider  acetaminophen (TYLENOL) 500 MG tablet Take 1,000 mg by mouth 3 (three) times daily.    Yes Historical Provider, MD  AMBULATORY NON FORMULARY MEDICATION Pressure Air Mattress Dx: G60.8, L89.150 10/30/14  Yes Tiffany L Reed, DO  aspirin EC 325 MG tablet Take 325 mg by mouth every  evening.   Yes Historical Provider, MD  bacitracin ointment Apply 1 application topically 2 (two) times daily. Applies to head of penis to decrease infection from catheter.   Yes Historical Provider, MD  Calcium Carbonate-Vit D-Min (CALTRATE 600+D PLUS) 600-800 MG-UNIT CHEW Chew 1 tablet by mouth 2 (two) times daily with a meal.    Yes Historical Provider, MD  ciprofloxacin (CIPRO) 500 MG tablet Take 1 tablet (500 mg total) by mouth 2 (two) times daily. 03/30/15  Yes Hosie Poisson, MD  CRANBERRY SOFT PO Take 2 capsules by mouth 3 (three) times daily.    Yes Historical Provider, MD  Cyanocobalamin (VITAMIN B-12) 5000 MCG SUBL Place 5,000 mcg under the tongue 2 (two) times daily.   Yes Historical Provider, MD  ferrous sulfate 325 (65 FE) MG tablet Take 325 mg by mouth every other day.    Yes Historical Provider, MD  guaiFENesin (MUCINEX) 600 MG 12 hr tablet Take 600 mg by mouth 2 (two) times daily as needed for cough.   Yes Historical Provider, MD  hydrALAZINE (APRESOLINE) 25 MG tablet Take 1/2 tablet by mouth twice daily to control blood pressure 11/14/14  Yes Mahima Pandey, MD  lactose free nutrition (BOOST) LIQD Take 90 mLs by mouth 3 (three) times daily between meals. Patient taking differently: Take 90 mLs by mouth 2 (two) times daily between meals.  07/01/14  Yes Barton Dubois, MD  levothyroxine (SYNTHROID, LEVOTHROID) 25 MCG tablet TAKE 1 TABLET BY MOUTH DAILY 10/19/14  Yes Tiffany L Reed, DO  lidocaine (LIDODERM) 5 % PLACE 3 PATCHES ONTO THE SKIN DAILY. REMOVE & DISCARD PATCH WITHIN 12 HOURS OR AS DIRECTED BY MD Patient taking differently: PLACE 1-3 PATCHES as needed for pain place ONTO THE SKIN DAILY. REMOVE & DISCARD PATCH WITHIN 12 HOURS OR AS DIRECTED BY MD 02/16/15  Yes Tiffany L Reed, DO  Melatonin 5 MG TABS Take 5 mg by mouth at bedtime as needed (For sleep.).   Yes Historical Provider, MD  memantine (NAMENDA) 10 MG tablet Take 10 mg by mouth 2 (two) times daily.   Yes Historical Provider, MD   Multiple Vitamins-Minerals (ICAPS AREDS FORMULA PO) Take 2 capsules by mouth 2 (two) times daily with a meal.    Yes Historical Provider, MD  Multiple Vitamins-Minerals (MULTIVITAMIN WITH MINERALS) tablet Take 1 tablet by mouth daily.   Yes Historical Provider, MD  polyethylene glycol (MIRALAX / GLYCOLAX) packet Take 17 g by mouth daily as needed for mild constipation. 07/01/14  Yes Barton Dubois, MD  Probiotic Product (Lewis) CAPS Take 1 capsule by mouth 2 (two) times daily.   Yes Historical Provider, MD  psyllium (METAMUCIL) 58.6 % packet 2 tablespoons two times daily   Yes Historical Provider, MD  valACYclovir (VALTREX) 1000 MG tablet Take 1 tablet (1,000 mg total) by mouth 2 (two) times  daily. 03/30/15  Yes Hosie Poisson, MD  VOLTAREN 1 % GEL Apply 2 g topically 4 (four) times daily.  07/10/14  Yes Historical Provider, MD   BP 138/81 mmHg  Pulse 73  Resp 17  SpO2 95% Physical Exam  Constitutional: He appears cachectic. He has a sickly appearance. He appears ill.  Contracted, not responding to questioning from staff or patient's son.  Pt appears to be awake.    HENT:  Head: Normocephalic and atraumatic.  Mouth/Throat: Mucous membranes are dry.  Cardiovascular: Normal rate and regular rhythm.   Pulmonary/Chest: Effort normal and breath sounds normal. No respiratory distress. He has no wheezes. He has no rales.  Abdominal: Soft. He exhibits no distension. There is no tenderness. There is no rebound and no guarding.  Musculoskeletal: He exhibits no edema.  Neurological: He exhibits abnormal muscle tone.  Nursing note and vitals reviewed.   ED Course  Procedures (including critical care time) Labs Review Labs Reviewed  COMPREHENSIVE METABOLIC PANEL - Abnormal; Notable for the following:    Chloride 99 (*)    Glucose, Bld 101 (*)    GFR calc non Af Amer 58 (*)    All other components within normal limits  CBC WITH DIFFERENTIAL/PLATELET - Abnormal; Notable for the  following:    Hemoglobin 12.3 (*)    HCT 38.1 (*)    Neutrophils Relative % 80 (*)    All other components within normal limits  URINALYSIS, ROUTINE W REFLEX MICROSCOPIC - Abnormal; Notable for the following:    APPearance CLOUDY (*)    Hgb urine dipstick MODERATE (*)    Protein, ur 30 (*)    Leukocytes, UA MODERATE (*)    All other components within normal limits  TROPONIN I - Abnormal; Notable for the following:    Troponin I 0.05 (*)    All other components within normal limits  URINE MICROSCOPIC-ADD ON - Abnormal; Notable for the following:    Squamous Epithelial / LPF FEW (*)    Casts HYALINE CASTS (*)    All other components within normal limits  I-STAT CG4 LACTIC ACID, ED - Abnormal; Notable for the following:    Lactic Acid, Venous 3.25 (*)    All other components within normal limits  URINE CULTURE  CULTURE, BLOOD (ROUTINE X 2)  CULTURE, BLOOD (ROUTINE X 2)  I-STAT CG4 LACTIC ACID, ED  I-STAT CG4 LACTIC ACID, ED    Imaging Review Ct Head Wo Contrast  03/31/2015   CLINICAL DATA:  Mental status change.  Not as responsive.  EXAM: CT HEAD WITHOUT CONTRAST  TECHNIQUE: Contiguous axial images were obtained from the base of the skull through the vertex without intravenous contrast.  COMPARISON:  Head CT 04/12/2014  FINDINGS: Patient is extremely kyphotic therefore non standard orientation was scanned. The generates bone artifact.  No acute intracranial hemorrhage. No focal mass lesion. No CT evidence of acute infarction. No midline shift or mass effect. No hydrocephalus. Basilar cisterns are patent.  Generalized cortical atrophy and ventricular dilatation.  Paranasal sinuses and mastoid air cells are clear. Chronic opacification of the sphenoid sinus.  IMPRESSION: 1. No acute intracranial findings. Nonstandard positioning does limit evaluation. 2. Chronic atrophy and microvascular disease. 3. Chronic opacification of the sphenoid sinuses air cells   Electronically Signed   By: Suzy Bouchard M.D.   On: 03/31/2015 20:35   Dg Chest Portable 1 View  03/31/2015   CLINICAL DATA:  Recent discharge from hospital for shingles an urinary tract infection.  Altered mental status.  EXAM: PORTABLE CHEST - 1 VIEW  COMPARISON:  03/25/2015 and 01/10/2015  FINDINGS: Patient is rotated to the left with chin/head overlying the left apex. Lungs are adequately inflated without consolidation or effusion. Small nodular opacity over the right midlung and left midlung unchanged. Mild stable cardiomegaly. Mild calcified plaque over the aortic arch. Remainder of the exam is unchanged.  IMPRESSION: No acute cardiopulmonary disease.  Stable small bilateral nodules unchanged as agree with previous recommendation of chest CT.  Mild cardiomegaly.   Electronically Signed   By: Marin Olp M.D.   On: 03/31/2015 19:30     EKG Interpretation None       6:57 PM Dr Claudine Mouton has also seen and examined the patient.    Discussed patient, results, treatment, and care with Dr Claudine Mouton.     MDM   Final diagnoses:  Decreased level of consciousness  Dehydration  UTI (lower urinary tract infection)   Afebrile elderly chronically ill patient who was d/c from hospital yesterday for dehydration, shingles, pseudomonal UTI (on cipro), brought in by son today for worsening condition, decreased LOC.  Pt was able to be spoon fed this morning but was not speaking, was not able to walk, this afternoon when son attempted to wake and move the patient he was limp, not responding.  Has hx syncope.  In ED pt was awake with eyes open but not responding to commands, upper extremities contracted.  Suspect pt with continued dehydration, persistent UTI.  Foley changed in ED.  Pt's son notes that patient has hx dysphagia and has been drooling a lot while sleeping today, in ED patient's O2 sats have declined somewhat - CXR is clear but concern for possible aspiration.  Discussed with Dr Claudine Mouton.  Cefepime ordered to cover health care associated UTI,  vancomycin added to cover possible aspiration pneumonia.   Admitted to [facility], Dr Arnoldo Morale accepting.      [place], PA-C 04/01/15 0786  Everlene Balls, MD 04/01/15 2238

## 2015-03-31 NOTE — ED Notes (Signed)
Nurse starting IV 

## 2015-04-01 DIAGNOSIS — E860 Dehydration: Secondary | ICD-10-CM | POA: Diagnosis present

## 2015-04-01 DIAGNOSIS — R4189 Other symptoms and signs involving cognitive functions and awareness: Secondary | ICD-10-CM | POA: Diagnosis present

## 2015-04-01 DIAGNOSIS — R404 Transient alteration of awareness: Secondary | ICD-10-CM

## 2015-04-01 LAB — CBC
HCT: 34.7 % — ABNORMAL LOW (ref 39.0–52.0)
HEMOGLOBIN: 11.1 g/dL — AB (ref 13.0–17.0)
MCH: 28.5 pg (ref 26.0–34.0)
MCHC: 32 g/dL (ref 30.0–36.0)
MCV: 89.2 fL (ref 78.0–100.0)
Platelets: 197 10*3/uL (ref 150–400)
RBC: 3.89 MIL/uL — AB (ref 4.22–5.81)
RDW: 14.3 % (ref 11.5–15.5)
WBC: 6.7 10*3/uL (ref 4.0–10.5)

## 2015-04-01 LAB — PROTIME-INR
INR: 1.26 (ref 0.00–1.49)
PROTHROMBIN TIME: 16 s — AB (ref 11.6–15.2)

## 2015-04-01 LAB — URINE CULTURE
COLONY COUNT: NO GROWTH
Culture: NO GROWTH

## 2015-04-01 LAB — PROCALCITONIN: Procalcitonin: <0.1 ng/mL

## 2015-04-01 LAB — BASIC METABOLIC PANEL
Anion gap: 9 (ref 5–15)
BUN: 13 mg/dL (ref 6–20)
CO2: 25 mmol/L (ref 22–32)
Calcium: 8.8 mg/dL — ABNORMAL LOW (ref 8.9–10.3)
Chloride: 103 mmol/L (ref 101–111)
Creatinine, Ser: 0.71 mg/dL (ref 0.61–1.24)
GFR calc Af Amer: >60 mL/min (ref >60)
GLUCOSE: 98 mg/dL (ref 65–99)
Potassium: 3.7 mmol/L (ref 3.5–5.1)
SODIUM: 137 mmol/L (ref 135–145)

## 2015-04-01 LAB — APTT: aPTT: 25 seconds (ref 24–37)

## 2015-04-01 LAB — LACTIC ACID, PLASMA
Lactic Acid, Venous: 1.9 mmol/L (ref 0.5–2.0)
Lactic Acid, Venous: 1.3 mmol/L (ref 0.5–2.0)

## 2015-04-01 MED ORDER — SODIUM CHLORIDE 0.9 % IV SOLN
INTRAVENOUS | Status: DC
  Administered 2015-04-01 – 2015-04-02 (×3): via INTRAVENOUS

## 2015-04-01 MED ORDER — LIDOCAINE 5 % EX PTCH
1.0000 | MEDICATED_PATCH | CUTANEOUS | Status: DC
  Administered 2015-04-02 – 2015-04-13 (×12): 1 via TRANSDERMAL
  Filled 2015-04-01 (×13): qty 1

## 2015-04-01 MED ORDER — ACETAMINOPHEN 650 MG RE SUPP: 650.0000 mg | Freq: Four times a day (QID) | RECTAL | Status: DC | PRN

## 2015-04-01 MED ORDER — ENOXAPARIN SODIUM 30 MG/0.3ML ~~LOC~~ SOLN
30.0000 mg | SUBCUTANEOUS | Status: DC
  Administered 2015-04-01: 30 mg via SUBCUTANEOUS
  Filled 2015-04-01: qty 0.3

## 2015-04-01 MED ORDER — ONDANSETRON HCL 4 MG/2ML IJ SOLN: 4.0000 mg | Freq: Four times a day (QID) | INTRAMUSCULAR | Status: DC | PRN

## 2015-04-01 MED ORDER — OXYCODONE HCL 5 MG PO TABS: 5.0000 mg | ORAL_TABLET | ORAL | Status: DC | PRN

## 2015-04-01 MED ORDER — ONDANSETRON HCL 4 MG PO TABS: 4.0000 mg | ORAL_TABLET | Freq: Four times a day (QID) | ORAL | Status: DC | PRN

## 2015-04-01 MED ORDER — SODIUM CHLORIDE 0.9 % IV BOLUS (SEPSIS)
1000.0000 mL | INTRAVENOUS | Status: AC
  Administered 2015-04-01 (×2): 1000 mL via INTRAVENOUS

## 2015-04-01 MED ORDER — SODIUM CHLORIDE 0.9 % IV SOLN: INTRAVENOUS | Status: DC

## 2015-04-01 MED ORDER — HYDROMORPHONE HCL 1 MG/ML IJ SOLN: 0.5000 mg | INTRAMUSCULAR | Status: DC | PRN

## 2015-04-01 MED ORDER — ACETAMINOPHEN 325 MG PO TABS: 650.0000 mg | ORAL_TABLET | Freq: Four times a day (QID) | ORAL | Status: DC | PRN

## 2015-04-01 MED ORDER — ENOXAPARIN SODIUM 40 MG/0.4ML ~~LOC~~ SOLN
40.0000 mg | SUBCUTANEOUS | Status: DC
  Administered 2015-04-02 – 2015-04-05 (×4): 40 mg via SUBCUTANEOUS
  Filled 2015-04-01 (×5): qty 0.4

## 2015-04-01 MED ORDER — ALUM & MAG HYDROXIDE-SIMETH 200-200-20 MG/5ML PO SUSP: 30.0000 mL | Freq: Four times a day (QID) | ORAL | Status: DC | PRN

## 2015-04-01 NOTE — Progress Notes (Addendum)
Patient ID: Ricky [name], male   DOB: [birth year], 81 y.o.   MRN: [id number]  [facility] PROGRESS NOTE  Ricky [name] XNA:[id number] DOB: Aug 27, 1917 DOA: 20-Apr-2015 PCP: Hollace Kinnier, DO   Brief narrative:    81 y.o. male with known Progressive Supranuclear Palsy HTN, Hypothyroid who was inpatient from May 8 through May 13 for UTI and Shingles and was brought to the ED due to increased lethargy and decreased LOC over the past 24 hours.He was discharge home on Cipro for a Pseudomonas UTI.   In ED, pt noted to be lethargic, VS stable, [facility] asked to admit for further evaluation.   Assessment/Plan:   Acute encephalopathy  - unclear etiology and likely multifactorial, secondary to progressive failure to thrive and poor oral intake  - pt recently admitted with Pseudomonas, this admission started on vanc and zosyn for presumptive sepsis - no clear criteria for sepsis met on admission so suspect other source of encephalopathy as noted above - will continue current ABX until final blood and urine cultures are back - continue IVF and reassess clinical progress in AM UTI - recent admission for Pseudomonas UTI - urine cultures repeated, will continue current ABX as noted above  Shingles - continue IV Acyclovir while NPO Supranuclear palsies, progressive - Chronic, with ? Acute Flare - will need PT/OT once more medically stable  Hypothyroidism - continue Levothyroxine IV while NPO Hypertension -IV Hydralazine PRN while NPO Severe PCM  with underlying dysphagia in the setting of supranuclear palsy  CKD (chronic kidney disease), stage II - repeat BMP in AM Anemia, chronic disease, IDA - no signs of active bleeding - repeat CBC in AM   DVT prophylaxis  - Lovenox SQ  Code Status: Full.  Family Communication:  No family at bedside  Disposition Plan: Too lethargic to be discharged   IV access:  Peripheral IV  Procedures and diagnostic studies:    Ct Head Wo Contrast  2015/04/20  No acute intracranial findings. Chronic atrophy and microvascular disease.  Dg Chest Portable 1 View  April 20, 2015  No acute cardiopulmonary disease.  Stable small bilateral nodules unchanged.  Dg Chest Port 1 View  03/25/2015   Limited exam without acute abnormality.     Medical Consultants:  None  Other Consultants:  None  IAnti-Infectives:   Vanc 04/20/23 --> Maxipime April 20, 2023 -->  Faye Ramsay, MD  [facility] Pager [phone]  If 7PM-7AM, please contact night-coverage [email or web address] Password [email or web address] 04/01/2015, 3:58 PM   LOS: 1 day   HPI/Subjective: No events overnight.   Objective: Filed Vitals:   04/20/15 2330 04/01/15 0142 04/01/15 0507 04/01/15 0906  BP: 153/68 177/91 156/80 150/79  Pulse: 61 72 66 58  Temp:  98.5 F (36.9 C) 98.6 F (37 C) 97.6 F (36.4 C)  TempSrc:  Axillary Axillary Oral  Resp: 17 18 17 18   Height:  6' 2"  (1.88 m)    Weight:  56.6 kg (124 lb 12.5 oz)    SpO2: 94% 95% 100% 100%    Intake/Output Summary (Last 24 hours) at 04/01/15 1558 Last data filed at 04/01/15 0600  Gross per 24 hour  Intake 2412.5 ml  Output      0 ml  Net 2412.5 ml    Exam:   General:  Pt is somnolent and somewhat difficult to arouse, NAD  Cardiovascular: Regular rate and rhythm, no rubs, no gallops  Respiratory: Clear to auscultation bilaterally, diminished breath sounds at bases   Abdomen: Soft, non tender, non distended, bowel sounds  present, no guarding  Extremities: No edema, pulses DP and PT palpable bilaterally  Data Reviewed: Basic Metabolic Panel:  Recent Labs Lab 03/26/15 0425 03/29/15 0351 03/31/15 2020 04/01/15 0140  NA 138 137 136 137  K 3.6 3.5 4.3 3.7  CL 107 103 99* 103  CO2 23 27 27 25   GLUCOSE 80 84 101* 98  BUN 18 6 16 13   CREATININE 0.71 0.57* 1.06 0.71  CALCIUM 8.8* 8.5* 9.4 8.8*   Liver Function Tests:  Recent Labs Lab 03/31/15 2020  AST 31  ALT 19  ALKPHOS 65  BILITOT 0.6  PROT 7.5  ALBUMIN 3.5    CBC:  Recent Labs Lab 03/26/15 0425 03/29/15 0351 03/31/15 2020 04/01/15 0140  WBC 5.4 4.8 6.7 6.7  NEUTROABS  --   --  5.4  --   HGB 11.2* 10.9* 12.3* 11.1*  HCT 34.7* 34.9* 38.1* 34.7*  MCV 89.0 89.5 89.9 89.2  PLT 195 192 212 197   Cardiac Enzymes:  Recent Labs Lab 03/31/15 2020  TROPONINI 0.05*   Recent Results (from the past 240 hour(s))  Urine culture     Status: None   Collection Time: 03/25/15  2:26 PM  Result Value Ref Range Status   Specimen Description URINE, CATHETERIZED  Final   Special Requests NONE  Final   Colony Count   Final    >=100,000 COLONIES/ML Performed at [facility]    Culture   Final    PSEUDOMONAS AERUGINOSA Performed at [facility]    Report Status 03/27/2015 FINAL  Final   Organism ID, Bacteria PSEUDOMONAS AERUGINOSA  Final      Susceptibility   Pseudomonas aeruginosa - MIC*    CEFEPIME 8 SENSITIVE Sensitive     CEFTAZIDIME 4 SENSITIVE Sensitive     CIPROFLOXACIN 1 SENSITIVE Sensitive     GENTAMICIN 2 SENSITIVE Sensitive     IMIPENEM 2 SENSITIVE Sensitive     PIP/TAZO 32 SENSITIVE Sensitive     TOBRAMYCIN <=1 SENSITIVE Sensitive     * PSEUDOMONAS AERUGINOSA     Scheduled Meds: . ceFEPime  IV  2 g Intravenous Q24H  . enoxaparin injection  30 mg Subcutaneous Q24H  . lidocaine  1 patch Transdermal Q24H  . vancomycin  750 mg Intravenous QHS   Continuous Infusions: . sodium chloride 75 mL/hr at 04/01/15 1404

## 2015-04-02 LAB — CBC
HCT: 32.9 % — ABNORMAL LOW (ref 39.0–52.0)
HEMOGLOBIN: 10.9 g/dL — AB (ref 13.0–17.0)
MCH: 29.6 pg (ref 26.0–34.0)
MCHC: 33.1 g/dL (ref 30.0–36.0)
MCV: 89.4 fL (ref 78.0–100.0)
Platelets: 199 10*3/uL (ref 150–400)
RBC: 3.68 MIL/uL — ABNORMAL LOW (ref 4.22–5.81)
RDW: 14.5 % (ref 11.5–15.5)
WBC: 5.2 10*3/uL (ref 4.0–10.5)

## 2015-04-02 LAB — BASIC METABOLIC PANEL
Anion gap: 9 (ref 5–15)
BUN: 8 mg/dL (ref 6–20)
CALCIUM: 8.7 mg/dL — AB (ref 8.9–10.3)
CO2: 25 mmol/L (ref 22–32)
CREATININE: 0.58 mg/dL — AB (ref 0.61–1.24)
Chloride: 102 mmol/L (ref 101–111)
Glucose, Bld: 80 mg/dL (ref 65–99)
Potassium: 3.1 mmol/L — ABNORMAL LOW (ref 3.5–5.1)
Sodium: 136 mmol/L (ref 135–145)

## 2015-04-02 MED ORDER — POTASSIUM CHLORIDE IN NACL 40-0.9 MEQ/L-% IV SOLN
INTRAVENOUS | Status: DC
Start: 2015-04-02 — End: 2015-04-07
  Administered 2015-04-02 – 2015-04-05 (×4): 50 mL/h via INTRAVENOUS
  Filled 2015-04-02 (×8): qty 1000

## 2015-04-02 MED ORDER — DEXTROSE 5 % IV SOLN
10.0000 mg/kg | Freq: Three times a day (TID) | INTRAVENOUS | Status: DC
  Administered 2015-04-02 – 2015-04-06 (×12): 565 mg via INTRAVENOUS
  Filled 2015-04-02 (×13): qty 11.3

## 2015-04-02 NOTE — Progress Notes (Signed)
PT Cancellation Note  Patient Details Name: Ricky [name] MRN: [id number] DOB: [birth year]   Cancelled Treatment:    Reason Eval/Treat Not Completed: Other (comment) Chart reviewed, noted that patient has been inpatient x 2 within  A month, patient deemed not a candidate for PT on each admit from SNF as patient requires total care. Note son  Wants to take patient home, also may have Palliative care consult. Will await decision for Home DC before initiating PT EVAL. Marland KitchenClaretha Cooper 04/02/2015, 4:24 PM Tresa Endo PT [phone]

## 2015-04-02 NOTE — Evaluation (Signed)
Clinical/Bedside Swallow Evaluation Patient Details  Name: TERI DILTZ MRN: [id number] Date of Birth: [birth year]  Today's Date: 04/02/2015 Time: SLP Start Time (ACUTE ONLY): 1325 SLP Stop Time (ACUTE ONLY): 1354 SLP Time Calculation (min) (ACUTE ONLY): 29 min  Past Medical History:  Past Medical History  Diagnosis Date  . IBS (irritable bowel syndrome)   . Diverticulosis   . Cataract   . OAB (overactive bladder)   . Macular degeneration   . Alzheimer disease   . Hypertension   . Arthritis   . Osteoporosis   . Parkinsonian syndrome   . Syncope, vasovagal   . Neuromuscular disorder     parkisonian syndrome  . 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 Unspecified transient cerebral ischemia   . Unspecified urinary incontinence   . Senile dementia, uncomplicated   . Unspecified constipation   . Hypothyroidism   . Anemia, unspecified   . Depression   . Alzheimer's disease   . Gout, unspecified   . Thoracic or lumbosacral neuritis or radiculitis, unspecified   . Progressive supranuclear palsy   . Unspecified transient cerebral ischemia   . Unspecified urinary incontinence   . Syncope and collapse   . Paralysis agitans   . Unspecified transient cerebral ischemia   . Pneumonitis due to inhalation of food or vomitus   . Cellulitis and abscess of oral soft tissues   . Screening for lipoid disorders   . Senile dementia, uncomplicated   . Unspecified constipation   . Disorder of bone and cartilage, unspecified   . Other specified acquired hypothyroidism   . Unspecified hypothyroidism   . Anemia, unspecified   . Depressive disorder, not elsewhere classified   . Alzheimer's disease   . Paralysis agitans   . Pneumonia, organism unspecified   . Pseudomonas infection in conditions classified elsewhere and of unspecified site   . Unspecified essential hypertension   . Urinary tract infection, site not specified   . Osteoarthrosis, unspecified  whether generalized or localized, unspecified site   . Osteoporosis, unspecified   . Gout, unspecified   . Thoracic or lumbosacral neuritis or radiculitis, unspecified   . Stroke    Past Surgical History:  Past Surgical History  Procedure Laterality Date  . Esophagogastroduodenoscopy    . Eye surgery  2006    LEFT CATARACT   HPI:  81 yo male adm to [facility] with AMS - PMH + for dementia, PSP, shingles, UTI.  Per MD note from prior admit earlier May, said admission AMS appeared to be multifactorial and secondary to progressive FTT, dehydration from poor oral intake, UTI, shingles.  Swallow evaluation ordered. CXR negative, CT head negative.      Assessment / Plan / Recommendation Clinical Impression  Findings continue to be consistent with swallow ability from August 2013 - no overt indications of aspiration with intake and evaluation during previous admit approximately one week ago.    Pt continues with delayed oral transiting- due to weakness/discoordination.  Pharyngeal swallow initiation may be delayed however pt appears protective of his airway.  Multiple swallows (suspect piecemealing) across consistencies noted-with 2nd swallow delayed.  Pt again indicates that he has not cleared his oral cavity by keeping his lips sealed when intake offered.  Uncertain if he responded to cues to swallow or boluses spilled into pharynx eliciting swallow.  Feeding pt will be laborious at times and his dysphagia may contribute to malnutrition/dehdration risk.   No family present to educate.  Skilled intervention included  determining effective compensation strategies for pt to mitigate aspiration risk and maximize intake.     Recommend dys3/thin diet with strict aspiration precautions.  Pt with increased delay in responses today compared to prior admission and thus follow up indicated.  SLP to follow up x1 for family education and tolerance.     Aspiration Risk  Moderate    Diet Recommendation Dysphagia 3  (Mech soft);Thin   Medication Administration: Crushed with puree Compensations: Small sips/bites;Slow rate (allow time for extra swallow)    Other  Recommendations Oral Care Recommendations: Oral care BID   Follow Up Recommendations       Frequency and Duration    1 week   Pertinent Vitals/Pain Afebrile, decreased     Swallow Study Prior Functional Status   see hhx    General Date of Onset: 03/26/15 Other Pertinent Information: 81 yo male adm to [facility] with AMS - PMH + for dementia, PSP, shingles, UTI.  Per MD note from prior admit earlier May, said admission AMS appeared to be multifactorial and secondary to progressive FTT, dehydration from poor oral intake, UTI, shingles.  Swallow evaluation ordered. CXR negative, CT head negative.    Type of Study: Bedside swallow evaluation Previous Swallow Assessment: MBS and BSE - Last BSE completed consistent with findings from [facility] conducted in August 2013. He continued with signs of delayed oral transiting, piecemealing and delayed pharyngeal swallow. Intermittent multiple swallows noted across consistencies, uncertain if due to piecemealing or residuals. Recommendation was for dys3/thin.   Diet Prior to this Study: NPO Respiratory Status: Room air History of Recent Intubation: No Behavior/Cognition: Alert;Requires cueing;Doesn't follow directions (inconsistently follows directions) Oral Cavity - Dentition:  (few dentition) Self-Feeding Abilities: Total assist Patient Positioning: Upright in bed Baseline Vocal Quality: Low vocal intensity Volitional Cough: Cognitively unable to elicit Volitional Swallow: Unable to elicit    Oral/Motor/Sensory Function Overall Oral Motor/Sensory Function:  (pt leaning left, did not follow directions for OME but able to seal lips on straw, spoon, cup)   Ice Chips Ice chips: Not tested   Thin Liquid Thin Liquid: Impaired Presentation: Cup;Spoon;Straw Oral Phase Impairments: Reduced lingual  movement/coordination;Impaired anterior to posterior transit Oral Phase Functional Implications: Prolonged oral transit;Oral holding Pharyngeal  Phase Impairments: Suspected delayed Swallow;Multiple swallows    Nectar Thick Nectar Thick Liquid: Not tested   Honey Thick Honey Thick Liquid: Not tested   Puree Puree: Impaired Presentation: Spoon Oral Phase Impairments: Impaired anterior to posterior transit;Reduced lingual movement/coordination Oral Phase Functional Implications: Prolonged oral transit;Oral holding Pharyngeal Phase Impairments: Suspected delayed Swallow;Multiple swallows   Solid   GO    Solid: Impaired Oral Phase Impairments: Reduced lingual movement/coordination;Impaired anterior to posterior transit;Impaired mastication Oral Phase Functional Implications: Oral holding (prolonged transiting) Pharyngeal Phase Impairments: Suspected delayed Swallow;Multiple swallows       Luanna Salk, [facility] SLP [phone]

## 2015-04-02 NOTE — Progress Notes (Signed)
CSW received call from patient's son, [name] (ph#: [phone]) that he plans to take his father back home with him at discharge, patient has had 4 bad experiences with SNFs ([facility], [place]) and does not want to go down that road again. Son did ask for a transport wheelchair to help with getting him in and out of the bathroom at home. RNCM, Kathy aware.   No further CSW needs identified - CSW signing off.   Raynaldo Opitz, [facility] Clinical Social Worker cell #: [phone]

## 2015-04-02 NOTE — Progress Notes (Signed)
Patient ID: CURRIE DENNIN, male   DOB: 11/18/1916, 81 y.o.   MRN: [id number]  [facility] PROGRESS NOTE  KAIAN FAHS QPR:[id number] DOB: 1917/08/23 DOA: 03/31/2015 PCP: Hollace Kinnier, DO   Brief narrative:    81 y.o. male with known Progressive Supranuclear Palsy HTN, Hypothyroid who was inpatient from May 8 through May 13 for UTI and Shingles and was brought to the ED due to increased lethargy and decreased LOC over the past 24 hours.He was discharge home on Cipro for a Pseudomonas UTI.   In ED, pt noted to be lethargic, VS stable, [facility] asked to admit for further evaluation.   Assessment/Plan:   Acute encephalopathy  - unclear etiology and likely multifactorial, secondary to progressive failure to thrive and poor oral intake in the setting of advancing parkinsonian syndrome and supranuclear palsy  - pt recently admitted with Pseudomonas, this admission started on vanc and zosyn for presumptive sepsis - no clear criteria for sepsis met on admission so suspect other source of encephalopathy as noted above - will continue current ABX until final blood and urine cultures are back - despite medical regimen, pt still very lethargic and minimally responsive to sternal rub - will discuss with family possible PCT consult for [place]  UTI - recent admission for Pseudomonas UTI - urine cultures repeated, will continue current ABX as noted above until final cultures are back  Shingles - continue IV Acyclovir while NPO - pharmacy to assist with dosing, assistance is appreciated  Acute on chronic functional quadriplegia secondary to Supranuclear palsies, progressive - Chronic, with ? Acute Flare - will need PT/OT once more medically stable but at this point pt is to lethargic to participate - pt requires full support and 2 pearson assistance at minimum  Hypokalemia - supplement via IV route as pt unable to take PO  Hypothyroidism - continue Levothyroxine IV while  NPO Hypertension -IV Hydralazine PRN while NPO Severe PCM - with underlying dysphagia in the setting of supranuclear palsy and failure to thrive - high risk aspiration  CKD (chronic kidney disease), stage II - repeat BMP in AM Anemia, chronic disease, IDA - no signs of active bleeding - repeat CBC in AM   DVT prophylaxis  - Lovenox SQ  Code Status: Full.  Family Communication:  No family at bedside Disposition Plan: Too lethargic to be discharged   IV access:  Peripheral IV  Procedures and diagnostic studies:    Ct Head Wo Contrast 03/31/2015  No acute intracranial findings. Chronic atrophy and microvascular disease.  Dg Chest Portable 1 View  03/31/2015  No acute cardiopulmonary disease.  Stable small bilateral nodules unchanged.  Dg Chest Port 1 View  03/25/2015   Limited exam without acute abnormality.     Medical Consultants:  None  Other Consultants:  None  IAnti-Infectives:   Vanc 5/14 --> maxipime  5/14 -->  Faye Ramsay, MD  [facility] Pager [phone]  If 7PM-7AM, please contact night-coverage [email or web address] Password [facility] 04/02/2015, 6:55 AM   LOS: 2 days   HPI/Subjective: No events overnight. Pt remains lethargic and only responds to sternal rub.  Objective: Filed Vitals:   04/01/15 0507 04/01/15 0906 04/01/15 2224 04/02/15 0439  BP: 156/80 150/79 126/74 169/80  Pulse: 66 58 58 54  Temp: 98.6 F (37 C) 97.6 F (36.4 C) 99.3 F (37.4 C) 98.3 F (36.8 C)  TempSrc: Axillary Oral Oral Oral  Resp: 17 18 20 20   Height:      Weight:  SpO2: 100% 100% 96% 97%    Intake/Output Summary (Last 24 hours) at 04/02/15 0655 Last data filed at 04/02/15 0600  Gross per 24 hour  Intake 1866.25 ml  Output   1800 ml  Net  66.25 ml    Exam:   General:  Pt is lethargic, non verbal, only responds to sternal rub   Cardiovascular: Regular rate and rhythm, no rubs, no gallops  Respiratory: poor inspiration, diminished breath sounds at bases    Abdomen: Soft, non tender, non distended, bowel sounds present, no guarding  Extremities: severely contracted   Data Reviewed: Basic Metabolic Panel:  Recent Labs Lab 03/29/15 0351 03/31/15 2020 04/01/15 0140 04/02/15 0449  NA 137 136 137 136  K 3.5 4.3 3.7 3.1*  CL 103 99* 103 102  CO2 27 27 25 25   GLUCOSE 84 101* 98 80  BUN 6 16 13 8   CREATININE 0.57* 1.06 0.71 0.58*  CALCIUM 8.5* 9.4 8.8* 8.7*   Liver Function Tests:  Recent Labs Lab 03/31/15 2020  AST 31  ALT 19  ALKPHOS 65  BILITOT 0.6  PROT 7.5  ALBUMIN 3.5   CBC:  Recent Labs Lab 03/29/15 0351 03/31/15 2020 04/01/15 0140 04/02/15 0449  WBC 4.8 6.7 6.7 5.2  NEUTROABS  --  5.4  --   --   HGB 10.9* 12.3* 11.1* 10.9*  HCT 34.9* 38.1* 34.7* 32.9*  MCV 89.5 89.9 89.2 89.4  PLT 192 212 197 199   Cardiac Enzymes:  Recent Labs Lab 03/31/15 2020  TROPONINI 0.05*   Recent Results (from the past 240 hour(s))  Urine culture     Status: None   Collection Time: 03/25/15  2:26 PM  Result Value Ref Range Status   Specimen Description URINE, CATHETERIZED  Final   Special Requests NONE  Final   Colony Count   Final    >=100,000 COLONIES/ML Performed at [facility]    Culture   Final    PSEUDOMONAS AERUGINOSA Performed at [facility]    Report Status 03/27/2015 FINAL  Final   Organism ID, Bacteria PSEUDOMONAS AERUGINOSA  Final      Susceptibility   Pseudomonas aeruginosa - MIC*    CEFEPIME 8 SENSITIVE Sensitive     CEFTAZIDIME 4 SENSITIVE Sensitive     CIPROFLOXACIN 1 SENSITIVE Sensitive     GENTAMICIN 2 SENSITIVE Sensitive     IMIPENEM 2 SENSITIVE Sensitive     PIP/TAZO 32 SENSITIVE Sensitive     TOBRAMYCIN <=1 SENSITIVE Sensitive     * PSEUDOMONAS AERUGINOSA  Urine culture     Status: None   Collection Time: 03/31/15  9:10 PM  Result Value Ref Range Status   Specimen Description URINE, CATHETER  Final   Special Requests NONE  Final   Colony Count NO GROWTH  Final    Report Status 04/01/2015 FINAL  Final     Scheduled Meds: . ceFEPime  IV  2 g Intravenous Q24H  . enoxaparin injection  30 mg Subcutaneous Q24H  . lidocaine  1 patch Transdermal Q24H  . vancomycin  750 mg Intravenous QHS   Continuous Infusions: . sodium chloride 50 mL/hr at 04/02/15 [phone]

## 2015-04-02 NOTE — Progress Notes (Signed)
ANTIBIOTIC CONSULT NOTE - INITIAL  Pharmacy Consult for Ayclovir Indication: shingles  Allergies  Allergen Reactions  . Other Other (See Comments)    Dairy products- cause swallowing difficulty     Patient Measurements: Height: 6\' 2"  (188 cm) Weight: 124 lb 12.5 oz (56.6 kg) IBW/kg (Calculated) : 82.2  Vital Signs: Temp: 98.3 F (36.8 C) (05/16 0439) Temp Source: Oral (05/16 0439) BP: 169/80 mmHg (05/16 0439) Pulse Rate: 54 (05/16 0439) Intake/Output from previous day: 05/15 0701 - 05/16 0700 In: 1866.3 [I.V.:1516.3; IV Piggyback:350] Out: 1800 [Urine:1800] Intake/Output from this shift:    Labs:  Recent Labs  03/31/15 2020 04/01/15 0140 04/02/15 0449  WBC 6.7 6.7 5.2  HGB 12.3* 11.1* 10.9*  PLT 212 197 199  CREATININE 1.06 0.71 0.58*   Estimated Creatinine Clearance: 44.2 mL/min (by C-G formula based on Cr of 0.58). No results for input(s): VANCOTROUGH, VANCOPEAK, VANCORANDOM, GENTTROUGH, GENTPEAK, GENTRANDOM, TOBRATROUGH, TOBRAPEAK, TOBRARND, AMIKACINPEAK, AMIKACINTROU, AMIKACIN in the last 72 hours.   Microbiology: Recent Results (from the past 720 hour(s))  Urine culture     Status: None   Collection Time: 03/25/15  2:26 PM  Result Value Ref Range Status   Specimen Description URINE, CATHETERIZED  Final   Special Requests NONE  Final   Colony Count   Final    >=100,000 COLONIES/ML Performed at [facility]    Culture   Final    PSEUDOMONAS AERUGINOSA Performed at [facility]    Report Status 03/27/2015 FINAL  Final   Organism ID, Bacteria PSEUDOMONAS AERUGINOSA  Final      Susceptibility   Pseudomonas aeruginosa - MIC*    CEFEPIME 8 SENSITIVE Sensitive     CEFTAZIDIME 4 SENSITIVE Sensitive     CIPROFLOXACIN 1 SENSITIVE Sensitive     GENTAMICIN 2 SENSITIVE Sensitive     IMIPENEM 2 SENSITIVE Sensitive     PIP/TAZO 32 SENSITIVE Sensitive     TOBRAMYCIN <=1 SENSITIVE Sensitive     * PSEUDOMONAS AERUGINOSA  Culture, blood  (routine x 2)     Status: None (Preliminary result)   Collection Time: 03/31/15  8:23 PM  Result Value Ref Range Status   Specimen Description BLOOD BLOOD RIGHT FOREARM UPPER  Final   Special Requests BOTTLES DRAWN AEROBIC AND ANAEROBIC 5CC  Final   Culture   Final           BLOOD CULTURE RECEIVED NO GROWTH TO DATE CULTURE WILL BE HELD FOR 5 DAYS BEFORE ISSUING A FINAL NEGATIVE REPORT Performed at [facility]    Report Status PENDING  Incomplete  Culture, blood (routine x 2)     Status: None (Preliminary result)   Collection Time: 03/31/15  8:23 PM  Result Value Ref Range Status   Specimen Description BLOOD RIGHT HAND  Final   Special Requests BOTTLES DRAWN AEROBIC AND ANAEROBIC 5CC  Final   Culture   Final           BLOOD CULTURE RECEIVED NO GROWTH TO DATE CULTURE WILL BE HELD FOR 5 DAYS BEFORE ISSUING A FINAL NEGATIVE REPORT Performed at [facility]    Report Status PENDING  Incomplete  Urine culture     Status: None   Collection Time: 03/31/15  9:10 PM  Result Value Ref Range Status   Specimen Description URINE, CATHETERIZED  Final   Special Requests NONE  Final   Colony Count NO GROWTH Performed at [facility]   Final   Culture NO GROWTH Performed  at [facility]   Final   Report Status 04/01/2015 FINAL  Final    Medical History: Past Medical History  Diagnosis Date  . IBS (irritable bowel syndrome)   . Diverticulosis   . Cataract   . OAB (overactive bladder)   . Macular degeneration   . Alzheimer disease   . Hypertension   . Arthritis   . Osteoporosis   . Parkinsonian syndrome   . Syncope, vasovagal   . Neuromuscular disorder     parkisonian syndrome  . 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 Unspecified transient cerebral ischemia   . Unspecified urinary incontinence   . Senile dementia, uncomplicated   . Unspecified constipation   . Hypothyroidism   . Anemia, unspecified   . Depression    . Alzheimer's disease   . Gout, unspecified   . Thoracic or lumbosacral neuritis or radiculitis, unspecified   . Progressive supranuclear palsy   . Unspecified transient cerebral ischemia   . Unspecified urinary incontinence   . Syncope and collapse   . Paralysis agitans   . Unspecified transient cerebral ischemia   . Pneumonitis due to inhalation of food or vomitus   . Cellulitis and abscess of oral soft tissues   . Screening for lipoid disorders   . Senile dementia, uncomplicated   . Unspecified constipation   . Disorder of bone and cartilage, unspecified   . Other specified acquired hypothyroidism   . Unspecified hypothyroidism   . Anemia, unspecified   . Depressive disorder, not elsewhere classified   . Alzheimer's disease   . Paralysis agitans   . Pneumonia, organism unspecified   . Pseudomonas infection in conditions classified elsewhere and of unspecified site   . Unspecified essential hypertension   . Urinary tract infection, site not specified   . Osteoarthrosis, unspecified whether generalized or localized, unspecified site   . Osteoporosis, unspecified   . Gout, unspecified   . Thoracic or lumbosacral neuritis or radiculitis, unspecified   . Stroke     Medications:  Scheduled:  . ceFEPime (MAXIPIME) IV  2 g Intravenous Q24H  . enoxaparin (LOVENOX) injection  40 mg Subcutaneous Q24H  . lidocaine  1 patch Transdermal Q24H  . vancomycin  750 mg Intravenous QHS   Infusions:  . sodium chloride 50 mL/hr at 04/02/15 9449   Assessment: Patient known to pharmacy for dosing of vanc and cefepime for HCAP. Note patient's treatment for shingles was started on last admission and was discharged with a 7 day course of Valtrex 4/14 - 4/20. Pre Md order, to continue to treat shingles with IV acyclovir while patient NPO inpatient. Per current normalized CrCl of > 50 ml/min/1.17m2, to start 10mg /kg IV q8  Goal of Therapy:  continued treatment of shingles  Plan:  Start  acyclovir 10mg /kg IV q8 to continue till 5/20 as originally planned for complete treatment of shingles   Adrian Saran, PharmD, BCPS Pager [phone] 04/02/2015 9:43 AM

## 2015-04-02 NOTE — Care Management Note (Signed)
Case Management Note  Patient Details  Name: [name] MRN: [id number] Date of Birth: 09/03/1917  Subjective/Objective:   81 y/o m admitted w/Sepsis.Readmit 5/8-5/13/16.                 Action/Plan:From home w/ [facility].May need additional services since per CSW son wants home,wants transport w/c.MD notes to discuss palliative w/son.   Expected Discharge Date:                  Expected Discharge Plan:  [place]  In-House Referral:     Discharge planning Services  CM Consult  Post Acute Care Choice:  Home Health [facility] home care) Choice offered to:     DME Arranged:    DME Agency:     HH Arranged:    HH Agency:     Status of Service:  In process, will continue to follow  Medicare Important Message Given:    Date Medicare IM Given:    Medicare IM give by:    Date Additional Medicare IM Given:    Additional Medicare Important Message give by:     If discussed at [place] of Stay Meetings, dates discussed:    Additional Comments:  Dessa Phi, RN 04/02/2015, 3:44 PM

## 2015-04-03 ENCOUNTER — Telehealth: Payer: Self-pay | Admitting: *Deleted

## 2015-04-03 ENCOUNTER — Encounter: Payer: Self-pay | Admitting: Internal Medicine

## 2015-04-03 LAB — BASIC METABOLIC PANEL
Anion gap: 6 (ref 5–15)
BUN: 9 mg/dL (ref 6–20)
CO2: 25 mmol/L (ref 22–32)
Calcium: 8.1 mg/dL — ABNORMAL LOW (ref 8.9–10.3)
Chloride: 101 mmol/L (ref 101–111)
Creatinine, Ser: 0.49 mg/dL — ABNORMAL LOW (ref 0.61–1.24)
Glucose, Bld: 93 mg/dL (ref 65–99)
Potassium: 3.9 mmol/L (ref 3.5–5.1)
SODIUM: 132 mmol/L — AB (ref 135–145)

## 2015-04-03 LAB — CBC
HEMATOCRIT: 37 % — AB (ref 39.0–52.0)
Hemoglobin: 12.1 g/dL — ABNORMAL LOW (ref 13.0–17.0)
MCH: 28.7 pg (ref 26.0–34.0)
MCHC: 32.7 g/dL (ref 30.0–36.0)
MCV: 87.9 fL (ref 78.0–100.0)
Platelets: 220 10*3/uL (ref 150–400)
RBC: 4.21 MIL/uL — ABNORMAL LOW (ref 4.22–5.81)
RDW: 14.5 % (ref 11.5–15.5)
WBC: 4.7 10*3/uL (ref 4.0–10.5)

## 2015-04-03 LAB — VANCOMYCIN, TROUGH: Vancomycin Tr: 6 ug/mL — ABNORMAL LOW (ref 10.0–20.0)

## 2015-04-03 MED ORDER — ENSURE ENLIVE PO LIQD
237.0000 mL | Freq: Two times a day (BID) | ORAL | Status: DC
  Administered 2015-04-04 – 2015-04-06 (×5): 237 mL via ORAL

## 2015-04-03 MED ORDER — CIPROFLOXACIN HCL 500 MG PO TABS
500.0000 mg | ORAL_TABLET | Freq: Two times a day (BID) | ORAL | Status: DC
  Administered 2015-04-03 – 2015-04-05 (×4): 500 mg via ORAL
  Filled 2015-04-03 (×5): qty 1

## 2015-04-03 MED ORDER — LEVOTHYROXINE SODIUM 100 MCG IV SOLR
12.5000 ug | Freq: Every day | INTRAVENOUS | Status: DC
  Administered 2015-04-03 – 2015-04-13 (×11): 12.5 ug via INTRAVENOUS
  Filled 2015-04-03 (×11): qty 5

## 2015-04-03 NOTE — Progress Notes (Signed)
Patient ID: SAATHVIK EVERY, male   DOB: 1917/06/06, 81 y.o.   MRN: [id number]  [facility] PROGRESS NOTE  KIMBERLY COYE [id number]:[id number] DOB: 12-12-1916 DOA: 03/31/2015 PCP: Hollace Kinnier, DO   Brief narrative:    81 y.o. male with known Progressive Supranuclear Palsy HTN, Hypothyroid who was inpatient from May 8 through May 13 for UTI and Shingles and was brought to the ED due to increased lethargy and decreased LOC over the past 24 hours.He was discharge home on Cipro for a Pseudomonas UTI.   In ED, pt noted to be lethargic, VS stable, TRH asked to admit for further evaluation.   Assessment/Plan:   Acute encephalopathy  - unclear etiology and likely multifactorial, secondary to progressive failure to thrive and poor oral intake in the setting of advancing parkinsonian syndrome and supranuclear palsy  - pt recently admitted with Pseudomonas UTI and was discharged home with Cipro, this admission started on vanc and zosyn for presumptive sepsis - no clear criteria for sepsis met on admission so suspect other source of encephalopathy as noted above - will continue ABC but will transition to Ciprofloxacin based on recent urine culture sensitivity report  - despite medical regimen, pt still very lethargic and minimally responsive to sternal rub - will discuss with family possible PCT consult for [place]  - left message to son  UTI - recent admission for Pseudomonas UTI - urine cultures repeated, will transition to Cipro 5/17  - today is day #4 of vancomycin and maxipime - recommendation for treatment of pseudomonas UTI was total 14 days, stop date is May 27th, 2016 if pt has no further complications or signs of sepsis  - repeat urine culture from 03/31/2015 with no bacterial growth  Shingles - continue IV Acyclovir while NPO - pharmacy to assist with dosing, assistance is appreciated  - stop date for Acyclovir is May 21st, 2016 Acute on chronic functional quadriplegia secondary to  Supranuclear palsies, progressive - Chronic, with ? Acute Flare - will need PT/OT once more medically stable but at this point pt is to lethargic to participate - pt requires full support and 2 pearson assistance at minimum  - son still insisting on taking pt home  Hypokalemia - supplemented via IV route and now WNL - will repeat BMP in AM Hypothyroidism - continue Levothyroxine IV until oral intake improves and pt can tolerate PO intake  - try to advance diet as recommended to dys III thin  Hypertension -IV Hydralazine PRN is sufficient for now  Severe PCM - with underlying dysphagia in the setting of supranuclear palsy and failure to thrive - high risk aspiration  - SLP requested and recommend dys III diet thin  CKD (chronic kidney disease), stage II - overall stable  - repeat BMP in AM Anemia, chronic disease, IDA - no signs of active bleeding - repeat CBC in AM   DVT prophylaxis  - Lovenox SQ  Code Status: Full.  Family Communication:  No family at bedside, left message for son to call me back  Disposition Plan: Too lethargic to be discharged at this time, still very poor oral intake, unable to get out of the bed, needs further GOC discussion with family  IV access:  Peripheral IV  Procedures and diagnostic studies:    Ct Head Wo Contrast 03/31/2015  No acute intracranial findings. Chronic atrophy and microvascular disease.  Dg Chest Portable 1 View  03/31/2015  No acute cardiopulmonary disease.  Stable small bilateral nodules unchanged.  Dg  Chest Port 1 View  03/25/2015   Limited exam without acute abnormality.     Medical Consultants:  None  Other Consultants:  None  IAnti-Infectives:   Vanc 5/14 --> 5/17 maxipime  5/14 --> 5/17 Cipro 5/17 --> 5/27 Acyclovir 6/16 --> 5/21  Faye Ramsay, MD  [facility] Pager [phone]  If 7PM-7AM, please contact night-coverage [email or web address] Password [email or web address] 04/03/2015, 1:12 PM   LOS: 3 days   HPI/Subjective: No events  overnight. Pt remains lethargic and only responds to sternal rub.  Objective: Filed Vitals:   04/02/15 1409 04/02/15 2109 04/03/15 0435 04/03/15 1142  BP: 170/99 158/88 138/77   Pulse: 64 80 65   Temp: 97.4 F (36.3 C) 98 F (36.7 C) 97.5 F (36.4 C)   TempSrc: Oral Oral Oral   Resp: 18 18 18    Height:    6' 2"  (1.88 m)  Weight:    56.6 kg (124 lb 12.5 oz)  SpO2: 98% 96% 98%     Intake/Output Summary (Last 24 hours) at 04/03/15 1312 Last data filed at 04/03/15 0946  Gross per 24 hour  Intake 1345.93 ml  Output   1275 ml  Net  70.93 ml    Exam:   General:  Pt is lethargic, non verbal, only responds to sternal rub   Cardiovascular: Regular rate and rhythm, no rubs, no gallops  Respiratory: poor inspiration, diminished breath sounds at bases   Abdomen: Soft, non tender, non distended, bowel sounds present, no guarding  Extremities: severely contracted in upper and lower extremities   Data Reviewed: Basic Metabolic Panel:  Recent Labs Lab 03/29/15 0351 03/31/15 2020 04/01/15 0140 04/02/15 0449 04/03/15 0812  NA 137 136 137 136 132*  K 3.5 4.3 3.7 3.1* 3.9  CL 103 99* 103 102 101  CO2 27 27 25 25 25   GLUCOSE 84 101* 98 80 93  BUN 6 16 13 8 9   CREATININE 0.57* 1.06 0.71 0.58* 0.49*  CALCIUM 8.5* 9.4 8.8* 8.7* 8.1*   Liver Function Tests:  Recent Labs Lab 03/31/15 2020  AST 31  ALT 19  ALKPHOS 65  BILITOT 0.6  PROT 7.5  ALBUMIN 3.5   CBC:  Recent Labs Lab 03/29/15 0351 03/31/15 2020 04/01/15 0140 04/02/15 0449 04/03/15 0812  WBC 4.8 6.7 6.7 5.2 4.7  NEUTROABS  --  5.4  --   --   --   HGB 10.9* 12.3* 11.1* 10.9* 12.1*  HCT 34.9* 38.1* 34.7* 32.9* 37.0*  MCV 89.5 89.9 89.2 89.4 87.9  PLT 192 212 197 199 220   Cardiac Enzymes:  Recent Labs Lab 03/31/15 2020  TROPONINI 0.05*   Recent Results (from the past 240 hour(s))  Urine culture     Status: None   Collection Time: 03/25/15  2:26 PM  Result Value Ref Range Status   Specimen  Description URINE, CATHETERIZED  Final   Special Requests NONE  Final   Colony Count   Final    >=100,000 COLONIES/ML Performed at [facility]    Culture   Final    PSEUDOMONAS AERUGINOSA Performed at [facility]    Report Status 03/27/2015 FINAL  Final   Organism ID, Bacteria PSEUDOMONAS AERUGINOSA  Final      Susceptibility   Pseudomonas aeruginosa - MIC*    CEFEPIME 8 SENSITIVE Sensitive     CEFTAZIDIME 4 SENSITIVE Sensitive     CIPROFLOXACIN 1 SENSITIVE Sensitive     GENTAMICIN 2 SENSITIVE Sensitive     IMIPENEM 2 SENSITIVE  Sensitive     PIP/TAZO 32 SENSITIVE Sensitive     TOBRAMYCIN <=1 SENSITIVE Sensitive     * PSEUDOMONAS AERUGINOSA  Urine culture     Status: None   Collection Time: 03/31/15  9:10 PM  Result Value Ref Range Status   Specimen Description URINE, CATHETER  Final   Special Requests NONE  Final   Colony Count NO GROWTH  Final   Report Status 04/01/2015 FINAL  Final     Scheduled Meds: . ceFEPime  IV  2 g Intravenous Q24H  . enoxaparin injection  30 mg Subcutaneous Q24H  . lidocaine  1 patch Transdermal Q24H  . vancomycin  750 mg Intravenous QHS   Continuous Infusions: . 0.9 % NaCl with KCl 40 mEq / L 50 mL/hr (04/02/15 1156)

## 2015-04-03 NOTE — Progress Notes (Signed)
Initial Nutrition Assessment  DOCUMENTATION CODES:  Severe malnutrition in context of chronic illness, Underweight  INTERVENTION:  Ensure Enlive (each supplement provides 350kcal and 20 grams of protein)  NUTRITION DIAGNOSIS:  Malnutrition related to chronic illness as evidenced by severe depletion of muscle mass, moderate depletion of body fat.  GOAL:  Patient will meet greater than or equal to 90% of their needs  MONITOR:  PO intake, Supplement acceptance, Weight trends, Labs, I & O's  REASON FOR ASSESSMENT:  Low Braden    ASSESSMENT: Pt with very extensive medical hx which includes Alzheimer's disease and Parkinson's. Pt was recently d/c'ed from the hospital on 03/30/15.  Pt seen for low Braden with underweight BMI. Pt ate 35% of breakfast and 0% of all meals yesterday per RN report in rounds. No family present and pt has been nonverbal since admission. Per weight hx review, pt has gained 9 lbs in the past 9 days; will monitor as this may be fluid related.  Not meeting needs. Physical assessment showed severe muscle and moderate fat wasting. Medications and labs reviewed; K: 3.1 mmol/L.  Height:  Ht Readings from Last 1 Encounters:  04/03/15 6\' 2"  (1.88 m)    Weight:  Wt Readings from Last 1 Encounters:  04/03/15 124 lb 12.5 oz (56.6 kg)    Ideal Body Weight:  86.4 kg (kg)  Wt Readings from Last 10 Encounters:  04/03/15 124 lb 12.5 oz (56.6 kg)  03/25/15 115 lb (52.164 kg)  01/11/15 115 lb (52.164 kg)  11/22/14 115 lb (52.164 kg)  06/28/14 160 lb 3.3 oz (72.67 kg)  04/20/14 150 lb (68.04 kg)  03/31/14 141 lb 8 oz (64.184 kg)  03/29/14 158 lb (71.668 kg)  03/28/14 153 lb 11.2 oz (69.718 kg)  03/13/14 145 lb 15.1 oz (66.2 kg)    BMI:  Body mass index is 16.01 kg/(m^2).  Estimated Nutritional Needs:  Kcal:  1200-1400  Protein:  60-80 grams  Fluid:  2L/day  Skin:  Wound (see comment)  Diet Order:  DIET DYS 3 Room service appropriate?: No; Fluid  consistency:: Thin  EDUCATION NEEDS:  No education needs identified at this time   Intake/Output Summary (Last 24 hours) at 04/03/15 1146 Last data filed at 04/03/15 0946  Gross per 24 hour  Intake 1345.93 ml  Output   1475 ml  Net -129.07 ml    Last BM:  PTA   Jarome Matin, RD, LDN Inpatient Clinical Dietitian Pager # [phone] After hours/weekend pager # [phone]

## 2015-04-03 NOTE — Progress Notes (Signed)
OT Cancellation Note  Patient Details Name: Ricky [name] MRN: [id number] DOB: 1917/01/27   Cancelled Treatment:    Reason Eval/Treat Not Completed: Other (comment).  Noted son wants to take pt home.  He was total care at [facility] and is lethargic at this time.  Please reorder OT if pt able to participate.  Ricky [name] 04/03/2015, 11:33 AM  Lesle Chris, OTR/L [phone] 04/03/2015

## 2015-04-03 NOTE — Progress Notes (Signed)
PT Cancellation Note  Patient Details Name: Ricky [name] MRN: [id number] DOB: 1917/08/20   Cancelled Treatment:    Reason Eval/Treat Not Completed: Order received. Chart reviewd. Familiar with pt from previous admissions. Pt is total assist for all care/mobility. Hoyer lift may be beneficial for transfers if family wishes to have this at home. Pt is not appropriate for skilled PT at acute level. Family could potentialy benefit from HHPT for education within home environment. Will sign off. Thanks.    Weston Anna, MPT Pager: [phone]

## 2015-04-03 NOTE — Progress Notes (Signed)
Report received from E. Reynolds,RN. No change in assessment.  Continue plan of care. Stacey Drain

## 2015-04-03 NOTE — Care Management Note (Signed)
Case Management Note  Patient Details  Name: Ricky [name] MRN: [id number] Date of Birth: 02/04/1917  Subjective/Objective:                    Action/Plan:Son aware of tentative discharge,    Expected Discharge Date:                  Expected Discharge Plan:  [place] [facility] care-HHRN/PT)  In-House Referral:     Discharge planning Services  CM Consult  Post Acute Care Choice:  Home Health [facility] home care) Choice offered to:  Adult Children (Spoke to son Ricky [name] about d/c plans-wants home w/HHRN/PT, & wants transport chair.)  DME Arranged:    DME Agency:     HH Arranged:  RN, PT [place] Agency:  [place]  Status of Service:  In process, will continue to follow  Medicare Important Message Given:  Yes Date Medicare IM Given:  04/03/15 Medicare IM give by:  Dessa Phi Date Additional Medicare IM Given:    Additional Medicare Important Message give by:     If discussed at [place] of Stay Meetings, dates discussed:    Additional Comments:  Dessa Phi, RN 04/03/2015, 2:03 PM

## 2015-04-03 NOTE — Telephone Encounter (Signed)
The discussion about hospice has been held several times along the way.  They will need an office appointment for this.  It's also been discussed numerous times at the hospital and they have not accepted the idea.  It even looks like his code status got reverted back to full code this last time.  He has said himself that he would not want to be revived.

## 2015-04-03 NOTE — Progress Notes (Signed)
Speech Language Pathology Treatment:    Patient Details Name: JEROLD YOSS MRN: [id number] DOB: 01/03/1917 Today's Date: 04/03/2015 Time: 5643-3295 SLP Time Calculation (min) (ACUTE ONLY): 8 min  Assessment / Plan / Recommendation Clinical Impression  Pt alert upon SLP entrance to room and willing to accept intake of water.  Delayed audible swallow with delayed dry swallow but no s/s of aspiration.  Note documented intake of 20-35%.  SLP spoke to RN who reports pt needs to be fed but she was not notified of overt coughing/choking.  Feeding pt is laborious which will increase his aspiration/malnutrition risk.  Signs posted in room for precautions.    As pt CXR negative and he appears to be tolerating po, SLP will sign off.  Continue diet with strict precautions.   No family present for education and pt's cognitive deficit prohibit him from comprehension of information.      HPI Other Pertinent Information: 81 yo male adm to [facility] with AMS - PMH + for dementia, PSP, shingles, UTI.  Per MD note from prior admit earlier May, said admission AMS appeared to be multifactorial and secondary to progressive FTT, dehydration from poor oral intake, UTI, shingles.  Swallow evaluation ordered. CXR negative, CT head negative.      Pertinent Vitals Pain Assessment: No/denies pain  SLP Plan    n/a    Recommendations  continue diet with strict precautions, no follow up from SLP indicated            Luanna Salk, [place] [facility] [place] [phone]

## 2015-04-03 NOTE — Telephone Encounter (Signed)
Ricky [name] Alert with [facility] called and stated that patient is being released from the hospital tomorrow. They have been seeing him in the hospital and has concerns regarding Goals of Care. Patient is having minimal response to sternal chest rubs, lethargic and needs Hospice but family not sure of that. Nurse stated that the brother is for it but the sister is against it.  The nurse is uncertain how to manage care under HomeHealth. They would like for you to call the family and advocate the goals of care to family and explain the importance of Hospice at the end of life. They feel like they would respond to you. Please Advise.

## 2015-04-03 NOTE — Progress Notes (Signed)
ANTIBIOTIC CONSULT NOTE - FOLLOW UP  Pharmacy Consult for Vancomycin, Cefepime, Acyclovir Indication: urosepsis, shingles  Allergies  Allergen Reactions  . Other Other (See Comments)    Dairy products- cause swallowing difficulty     Patient Measurements: Height: 6\' 2"  (188 cm) Weight: 124 lb 12.5 oz (56.6 kg) IBW/kg (Calculated) : 82.2  Vital Signs: Temp: 97.5 F (36.4 C) (05/17 0435) Temp Source: Oral (05/17 0435) BP: 138/77 mmHg (05/17 0435) Pulse Rate: 65 (05/17 0435) Intake/Output from previous day: 05/16 0701 - 05/17 0700 In: 1337.2 [I.V.:803.3; IV Piggyback:533.9] Out: 2725 [Urine:2725] Intake/Output from this shift:    Labs:  Recent Labs  04/01/15 0140 04/02/15 0449 04/03/15 0812  WBC 6.7 5.2 4.7  HGB 11.1* 10.9* 12.1*  PLT 197 199 220  CREATININE 0.71 0.58* 0.49*   Estimated Creatinine Clearance: 44.2 mL/min (by C-G formula based on Cr of 0.49). No results for input(s): VANCOTROUGH, VANCOPEAK, VANCORANDOM, GENTTROUGH, GENTPEAK, GENTRANDOM, TOBRATROUGH, TOBRAPEAK, TOBRARND, AMIKACINPEAK, AMIKACINTROU, AMIKACIN in the last 72 hours.   Microbiology: Recent Results (from the past 720 hour(s))  Urine culture     Status: None   Collection Time: 03/25/15  2:26 PM  Result Value Ref Range Status   Specimen Description URINE, CATHETERIZED  Final   Special Requests NONE  Final   Colony Count   Final    >=100,000 COLONIES/ML Performed at [facility]    Culture   Final    PSEUDOMONAS AERUGINOSA Performed at [facility]    Report Status 03/27/2015 FINAL  Final   Organism ID, Bacteria PSEUDOMONAS AERUGINOSA  Final      Susceptibility   Pseudomonas aeruginosa - MIC*    CEFEPIME 8 SENSITIVE Sensitive     CEFTAZIDIME 4 SENSITIVE Sensitive     CIPROFLOXACIN 1 SENSITIVE Sensitive     GENTAMICIN 2 SENSITIVE Sensitive     IMIPENEM 2 SENSITIVE Sensitive     PIP/TAZO 32 SENSITIVE Sensitive     TOBRAMYCIN <=1 SENSITIVE Sensitive     *  PSEUDOMONAS AERUGINOSA  Culture, blood (routine x 2)     Status: None (Preliminary result)   Collection Time: 03/31/15  8:23 PM  Result Value Ref Range Status   Specimen Description BLOOD BLOOD RIGHT FOREARM UPPER  Final   Special Requests BOTTLES DRAWN AEROBIC AND ANAEROBIC 5CC  Final   Culture   Final           BLOOD CULTURE RECEIVED NO GROWTH TO DATE CULTURE WILL BE HELD FOR 5 DAYS BEFORE ISSUING A FINAL NEGATIVE REPORT Performed at [facility]    Report Status PENDING  Incomplete  Culture, blood (routine x 2)     Status: None (Preliminary result)   Collection Time: 03/31/15  8:23 PM  Result Value Ref Range Status   Specimen Description BLOOD RIGHT HAND  Final   Special Requests BOTTLES DRAWN AEROBIC AND ANAEROBIC 5CC  Final   Culture   Final           BLOOD CULTURE RECEIVED NO GROWTH TO DATE CULTURE WILL BE HELD FOR 5 DAYS BEFORE ISSUING A FINAL NEGATIVE REPORT Performed at [facility]    Report Status PENDING  Incomplete  Urine culture     Status: None   Collection Time: 03/31/15  9:10 PM  Result Value Ref Range Status   Specimen Description URINE, CATHETERIZED  Final   Special Requests NONE  Final   Colony Count NO GROWTH Performed at [facility]   Final  Culture NO GROWTH Performed at [facility]   Final   Report Status 04/01/2015 FINAL  Final    Anti-infectives    Start     Dose/Rate Route Frequency Ordered Stop   04/02/15 1200  acyclovir (ZOVIRAX) 565 mg in dextrose 5 % 100 mL IVPB     10 mg/kg  56.6 kg 111.3 mL/hr over 60 Minutes Intravenous Every 8 hours 04/02/15 0944 04/07/15 0359   04/01/15 2200  ceFEPIme (MAXIPIME) 2 g in dextrose 5 % 50 mL IVPB     2 g 100 mL/hr over 30 Minutes Intravenous Every 24 hours 03/31/15 2104     03/31/15 2300  vancomycin (VANCOCIN) IVPB 750 mg/150 ml premix     750 mg 150 mL/hr over 60 Minutes Intravenous Daily at bedtime 03/31/15 2253     03/31/15 2100  ceFEPIme (MAXIPIME) 2 g in dextrose 5  % 50 mL IVPB     2 g 100 mL/hr over 30 Minutes Intravenous  Once 03/31/15 2054 03/31/15 2201      Assessment 81 yo male presents to ER with AMS after just being discharged 5/13 and treated for pseudomonas UTI, dehydration and shingles. Patient with advanced dementia and catheter in place per notes. To start cefepime per pharmacy dosing. Note that 5/8 urine culture grew pseudomonas that was pansensitive and was treated with Cipro. Patient also had pseudomonas in urine back in feb as well as enterococcus.  Note patient's treatment for shingles was started on last admission and was discharged with a 7 day course of Valtrex 4/14 - 4/20. Pre Md order, to continue to treat shingles with IV acyclovir while patient NPO inpatient. Per current normalized CrCl of > 50 ml/min/1.68m2, to start 10mg /kg IV q8  5/8 >> CTX >> 5/9 5/10 >> Cipro >> 5/13 5/14 >> cefepime >> 5/14 >> vanc >> 5/8 >> Valtrex >> 5/14 (supposed to start 5/14 and end 5/20, not cont on admission) 5/16 >> IV acyclovir >>  Temp: afebrile WBC: wnl Renal: SCr low, stable; CrCl 44  5/14 blood x 2: NGTD 5/14 urine: NGF  Goal of Therapy:   Vancomycin trough level 15-20 mcg/ml   Eradication of infection  Appropriate antibiotic dosing for indication and renal function  Plan:  Day 4 of antibiotics (day 2 acyclovir)  Continue vancomycin 750 mg IV q24 hr. Patient qualifies for 500 mg q12 dosing but will check trough before adjusting as day 4 of Tx.  Will check vancomycin trough tonight before 4th dose  Continue cefepime 2g IV q24 hr  Continue acyclovir 10 mg/kg q8 hr  UOP good, but must ensure adequate fluid intake with acyclovir, given age and borderline renal function  Follow clinical course, renal function, culture results as available  Follow for de-escalation of antibiotics and LOT   Reuel Boom, PharmD Pager: [phone] 04/03/2015, 9:15 AM

## 2015-04-04 DIAGNOSIS — E860 Dehydration: Secondary | ICD-10-CM

## 2015-04-04 DIAGNOSIS — R627 Adult failure to thrive: Secondary | ICD-10-CM

## 2015-04-04 DIAGNOSIS — B029 Zoster without complications: Secondary | ICD-10-CM

## 2015-04-04 DIAGNOSIS — N390 Urinary tract infection, site not specified: Secondary | ICD-10-CM

## 2015-04-04 DIAGNOSIS — A4152 Sepsis due to Pseudomonas: Secondary | ICD-10-CM

## 2015-04-04 DIAGNOSIS — G92 Toxic encephalopathy: Principal | ICD-10-CM

## 2015-04-04 DIAGNOSIS — R404 Transient alteration of awareness: Secondary | ICD-10-CM

## 2015-04-04 DIAGNOSIS — N182 Chronic kidney disease, stage 2 (mild): Secondary | ICD-10-CM

## 2015-04-04 DIAGNOSIS — G231 Progressive supranuclear ophthalmoplegia [Steele-Richardson-Olszewski]: Secondary | ICD-10-CM

## 2015-04-04 LAB — CBC
HCT: 34.2 % — ABNORMAL LOW (ref 39.0–52.0)
HEMOGLOBIN: 11 g/dL — AB (ref 13.0–17.0)
MCH: 28.5 pg (ref 26.0–34.0)
MCHC: 32.2 g/dL (ref 30.0–36.0)
MCV: 88.6 fL (ref 78.0–100.0)
Platelets: 207 10*3/uL (ref 150–400)
RBC: 3.86 MIL/uL — ABNORMAL LOW (ref 4.22–5.81)
RDW: 14.7 % (ref 11.5–15.5)
WBC: 4.5 10*3/uL (ref 4.0–10.5)

## 2015-04-04 LAB — BASIC METABOLIC PANEL
Anion gap: 9 (ref 5–15)
BUN: 11 mg/dL (ref 6–20)
CALCIUM: 8.6 mg/dL — AB (ref 8.9–10.3)
CO2: 24 mmol/L (ref 22–32)
Chloride: 102 mmol/L (ref 101–111)
Creatinine, Ser: 0.57 mg/dL — ABNORMAL LOW (ref 0.61–1.24)
GFR calc Af Amer: >60 mL/min (ref >60)
Glucose, Bld: 95 mg/dL (ref 65–99)
POTASSIUM: 3.6 mmol/L (ref 3.5–5.1)
Sodium: 135 mmol/L (ref 135–145)

## 2015-04-04 NOTE — Care Management Note (Signed)
Case Management Note  Patient Details  Name: Ricky [name] MRN: [id number] Date of Birth: 1917-02-20  Subjective/Objective:                    Action/Plan:Awaiting result of MD/Son discussion re: more appropriate discharge plan.   Expected Discharge Date:                  Expected Discharge Plan:  [place] (Awaiting MD to discuss discharge plans with son.[place] care-HHRN/PT; )  In-House Referral:     Discharge planning Services  CM Consult  Post Acute Care Choice:  Home Health [facility] home care) Choice offered to:  Adult Children (Spoke to son [name] about d/c plans-wants home w/HHRN/PT, & wants transport chair.)  DME Arranged:    DME Agency:     HH Arranged:    HH Agency:  [place]  Status of Service:  In process, will continue to follow  Medicare Important Message Given:  Yes Date Medicare IM Given:  04/03/15 Medicare IM give by:  Dessa Phi Date Additional Medicare IM Given:    Additional Medicare Important Message give by:     If discussed at [place] of Stay Meetings, dates discussed:    Additional Comments:  Dessa Phi, RN 04/04/2015, 3:01 PM

## 2015-04-04 NOTE — Progress Notes (Signed)
Progress Note   Ricky [name]:[id number] DOB: 1917-01-31 DOA: 06-Apr-2015 PCP: Hollace Kinnier, DO   Brief Narrative:   Ricky [name] is an 81 y.o. male with a PMH of progressive supranuclear palsy, hypertension, and hypothyroidism, recent hospitalization 03/25/15-03/30/15 for treatment of UTI and shingles who was readmitted 2015/04/06 secondary to increased lethargy/decreased LOC. He had been discharged home on Cipro for treatment of pseudomonal UTI.  Assessment/Plan:   Principal Problem:   Acute Toxic encephalopathy, infectious versus progressive supranuclear palsy / decreased level of consciousness / failure to thrive in adult - Previous urine culture positive for Pseudomonas. Antibiotics initially broad-spectrum, narrowed to Cipro 04/03/15. - Would benefit from palliative care involvement. According to PCP, this has been discussed and family has resisted. - Catheter changed 06-Apr-2015.  Active Problems:   Functional quadriplegia secondary to supranuclear palsy - PT/OT when stable. Continues to require 2 person assist.    Hypothyroidism - Continue to monitor and replace as needed.    Hypertension - Continue hydralazine as needed.    Dysphagia / severe protein calorie malnutrition - Seen by speech therapy with recommendations for dysphasia 3 diet with thin liquids. - Continue nutritional supplements.    CKD (chronic kidney disease), stage II - Renal function stable, at usual baseline.    Anemia, chronic disease - Hemoglobin stable with no current indication for transfusion.    UTI (lower urinary tract infection) / possible sepsis - Recent cultures positive for Pseudomonas. Plan for 2 week course of therapy, to complete 04/13/15. - Repeat urine culture Apr 06, 2015 negative. - Continue Cipro.    Shingles - Being treated with acyclovir through 04/07/15.    Dehydration - Resolved with IV fluids.    DVT Prophylaxis - Continue subcutaneous Lovenox.  Code Status:  Full. Family Communication: No family currently present.  [name] called at [phone] & updated.   Disposition Plan: Home with son when stable, likely several more days.   IV Access:    Peripheral IV   Procedures and diagnostic studies:   .Ct Head Wo Contrast  04-06-2015   CLINICAL DATA:  Mental status change.  Not as responsive.  EXAM: CT HEAD WITHOUT CONTRAST  TECHNIQUE: Contiguous axial images were obtained from the base of the skull through the vertex without intravenous contrast.  COMPARISON:  Head CT 04/12/2014  FINDINGS: Patient is extremely kyphotic therefore non standard orientation was scanned. The generates bone artifact.  No acute intracranial hemorrhage. No focal mass lesion. No CT evidence of acute infarction. No midline shift or mass effect. No hydrocephalus. Basilar cisterns are patent.  Generalized cortical atrophy and ventricular dilatation.  Paranasal sinuses and mastoid air cells are clear. Chronic opacification of the sphenoid sinus.  IMPRESSION: 1. No acute intracranial findings. Nonstandard positioning does limit evaluation. 2. Chronic atrophy and microvascular disease. 3. Chronic opacification of the sphenoid sinuses air cells   Electronically Signed   By: Suzy Bouchard M.D.   On: 04/06/15 20:35   Dg Chest Portable 1 View  04-06-15   CLINICAL DATA:  Recent discharge from hospital for shingles an urinary tract infection. Altered mental status.  EXAM: PORTABLE CHEST - 1 VIEW  COMPARISON:  03/25/2015 and 01/10/2015  FINDINGS: Patient is rotated to the left with chin/head overlying the left apex. Lungs are adequately inflated without consolidation or effusion. Small nodular opacity over the right midlung and left midlung unchanged. Mild stable cardiomegaly. Mild calcified plaque over the aortic arch. Remainder of the exam is unchanged.  IMPRESSION: No acute  cardiopulmonary disease.  Stable small bilateral nodules unchanged as agree with previous recommendation of  chest CT.  Mild cardiomegaly.   Electronically Signed   By: Marin Olp M.D.   On: 03/31/2015 19:30   Dg Chest Port 1 View  03/25/2015   CLINICAL DATA:  Syncopal episode  EXAM: PORTABLE CHEST - 1 VIEW  COMPARISON:  01/10/2015  FINDINGS: Examination is somewhat limited secondary to the patient's inability to adequately position herself. The cardiac shadow is mildly enlarged but stable. The lungs are well aerated bilaterally. The previously seen nodular densities are not as well appreciated on today's exam. No focal confluent infiltrate is seen.  IMPRESSION: Limited exam without acute abnormality.   Electronically Signed   By: Inez Catalina M.D.   On: 03/25/2015 15:14     Medical Consultants:    None.  Anti-Infectives:    Vanc 5/14 --> 5/17  maxipime 5/14 --> 5/17  Cipro 5/17 --> 5/27  Acyclovir 6/16 --> 5/21  Subjective:   [name] is minimally verbal and difficult to understand, but does not appear to be in any acute distress. The patient's son reports that he is not yet back to baseline.  Objective:    Filed Vitals:   04/03/15 1142 04/03/15 1328 04/03/15 2047 04/04/15 0503  BP:  130/70 152/71 123/63  Pulse:  60 65 59  Temp:  97.4 F (36.3 C) 98.4 F (36.9 C) 98.4 F (36.9 C)  TempSrc:  Oral Oral Oral  Resp:  18 20 18   Height: 6\' 2"  (1.88 m)     Weight: 56.6 kg (124 lb 12.5 oz)     SpO2:  98% 97% 96%    Intake/Output Summary (Last 24 hours) at 04/04/15 8242 Last data filed at 04/04/15 0608  Gross per 24 hour  Intake 1755.56 ml  Output    950 ml  Net 805.56 ml    Exam: Gen:  NAD Cardiovascular:  RRR, No M/R/G Respiratory:  Lungs diminished Gastrointestinal:  Abdomen soft, NT/ND, + BS Extremities:  No C/E/C, upper extremity contractures   Data Reviewed:    Labs: Basic Metabolic Panel:  Recent Labs Lab 03/31/15 2020 04/01/15 0140 04/02/15 0449 04/03/15 0812 04/04/15 0435  NA 136 137 136 132* 135  K 4.3 3.7 3.1* 3.9 3.6  CL 99* 103 102  101 102  CO2 27 25 25 25 24   GLUCOSE 101* 98 80 93 95  BUN 16 13 8 9 11   CREATININE 1.06 0.71 0.58* 0.49* 0.57*  CALCIUM 9.4 8.8* 8.7* 8.1* 8.6*   GFR Estimated Creatinine Clearance: 44.2 mL/min (by C-G formula based on Cr of 0.57). Liver Function Tests:  Recent Labs Lab 03/31/15 2020  AST 31  ALT 19  ALKPHOS 65  BILITOT 0.6  PROT 7.5  ALBUMIN 3.5   Coagulation profile  Recent Labs Lab 04/01/15 0335 04/01/15 0602  INR SPECIMEN CLOTTED 1.26    CBC:  Recent Labs Lab 03/31/15 2020 04/01/15 0140 04/02/15 0449 04/03/15 0812 04/04/15 0435  WBC 6.7 6.7 5.2 4.7 4.5  NEUTROABS 5.4  --   --   --   --   HGB 12.3* 11.1* 10.9* 12.1* 11.0*  HCT 38.1* 34.7* 32.9* 37.0* 34.2*  MCV 89.9 89.2 89.4 87.9 88.6  PLT 212 197 199 220 207   Cardiac Enzymes:  Recent Labs Lab 03/31/15 2020  TROPONINI 0.05*   Sepsis Labs:  Recent Labs Lab 03/31/15 2023 03/31/15 2259 04/01/15 0140 04/01/15 0335 04/02/15 0449 04/03/15 0812 04/04/15 0435  PROCALCITON  --   --  <  0.10  --   --   --   --   WBC  --   --  6.7  --  5.2 4.7 4.5  LATICACIDVEN 3.25* 2.02* 1.9 1.3  --   --   --    Microbiology Recent Results (from the past 240 hour(s))  Urine culture     Status: None   Collection Time: 03/25/15  2:26 PM  Result Value Ref Range Status   Specimen Description URINE, CATHETERIZED  Final   Special Requests NONE  Final   Colony Count   Final    >=100,000 COLONIES/ML Performed at [facility]    Culture   Final    PSEUDOMONAS AERUGINOSA Performed at [facility]    Report Status 03/27/2015 FINAL  Final   Organism ID, Bacteria PSEUDOMONAS AERUGINOSA  Final      Susceptibility   Pseudomonas aeruginosa - MIC*    CEFEPIME 8 SENSITIVE Sensitive     CEFTAZIDIME 4 SENSITIVE Sensitive     CIPROFLOXACIN 1 SENSITIVE Sensitive     GENTAMICIN 2 SENSITIVE Sensitive     IMIPENEM 2 SENSITIVE Sensitive     PIP/TAZO 32 SENSITIVE Sensitive     TOBRAMYCIN <=1 SENSITIVE  Sensitive     * PSEUDOMONAS AERUGINOSA  Culture, blood (routine x 2)     Status: None (Preliminary result)   Collection Time: 03/31/15  8:23 PM  Result Value Ref Range Status   Specimen Description BLOOD BLOOD RIGHT FOREARM UPPER  Final   Special Requests BOTTLES DRAWN AEROBIC AND ANAEROBIC 5CC  Final   Culture   Final           BLOOD CULTURE RECEIVED NO GROWTH TO DATE CULTURE WILL BE HELD FOR 5 DAYS BEFORE ISSUING A FINAL NEGATIVE REPORT Performed at [facility]    Report Status PENDING  Incomplete  Culture, blood (routine x 2)     Status: None (Preliminary result)   Collection Time: 03/31/15  8:23 PM  Result Value Ref Range Status   Specimen Description BLOOD RIGHT HAND  Final   Special Requests BOTTLES DRAWN AEROBIC AND ANAEROBIC 5CC  Final   Culture   Final           BLOOD CULTURE RECEIVED NO GROWTH TO DATE CULTURE WILL BE HELD FOR 5 DAYS BEFORE ISSUING A FINAL NEGATIVE REPORT Performed at [facility]    Report Status PENDING  Incomplete  Urine culture     Status: None   Collection Time: 03/31/15  9:10 PM  Result Value Ref Range Status   Specimen Description URINE, CATHETERIZED  Final   Special Requests NONE  Final   Colony Count NO GROWTH Performed at [facility]   Final   Culture NO GROWTH Performed at [facility]   Final   Report Status 04/01/2015 FINAL  Final     Medications:   . acyclovir  10 mg/kg Intravenous Q8H  . ciprofloxacin  500 mg Oral BID  . enoxaparin (LOVENOX) injection  40 mg Subcutaneous Q24H  . feeding supplement (ENSURE ENLIVE)  237 mL Oral BID BM  . levothyroxine  12.5 mcg Intravenous Daily  . lidocaine  1 patch Transdermal Q24H   Continuous Infusions: . 0.9 % NaCl with KCl 40 mEq / L 50 mL/hr (04/03/15 1357)    Time spent: 25 minutes.   LOS: 4 days   [facility] Pager [phone]. If unable to reach me by pager, please call my cell phone at  [phone].  *Please refer to [email or web address],  password [email or web address] to get updated schedule on who will round on this patient, as hospitalists switch teams weekly. If 7PM-7AM, please contact night-coverage at [email or web address], password [email or web address] for any overnight needs.  04/04/2015, 7:12 AM

## 2015-04-05 DIAGNOSIS — D638 Anemia in other chronic diseases classified elsewhere: Secondary | ICD-10-CM

## 2015-04-05 DIAGNOSIS — R1310 Dysphagia, unspecified: Secondary | ICD-10-CM

## 2015-04-05 LAB — CULTURE, BLOOD (ROUTINE X 2)

## 2015-04-05 MED ORDER — CIPROFLOXACIN IN D5W 400 MG/200ML IV SOLN
400.0000 mg | Freq: Two times a day (BID) | INTRAVENOUS | Status: DC
  Administered 2015-04-05 – 2015-04-06 (×2): 400 mg via INTRAVENOUS
  Filled 2015-04-05 (×2): qty 200

## 2015-04-05 NOTE — Plan of Care (Signed)
Problem: Phase II Progression Outcomes Goal: Progress activity as tolerated unless otherwise ordered Outcome: Adequate for Discharge PT eval revealed that patient is maximum assist

## 2015-04-05 NOTE — Progress Notes (Signed)
Progress Note   Ricky [name] OEV:[id number] DOB: 01-28-1917 DOA: Apr 04, 2015 PCP: Hollace Kinnier, DO   Brief Narrative:   Ricky [name] is an 81 y.o. male with a PMH of progressive supranuclear palsy, hypertension, and hypothyroidism, recent hospitalization 03/25/15-03/30/15 for treatment of UTI and shingles who was readmitted Apr 04, 2015 secondary to increased lethargy/decreased LOC. He had been discharged home on Cipro for treatment of pseudomonal UTI.  Assessment/Plan:   Principal Problem:   Acute Toxic encephalopathy, infectious versus progressive supranuclear palsy / decreased level of consciousness / failure to thrive in adult - Previous urine culture positive for Pseudomonas. Antibiotics initially broad-spectrum, narrowed to Cipro 04/03/15. - Would benefit from palliative care involvement. According to PCP, this has been discussed and family has resisted. - Catheter changed Apr 04, 2015.  Active Problems:   Functional quadriplegia secondary to supranuclear palsy - PT/OT ordered. Has been too lethargic to participate. - Continues to require 2 person assist.    Hypothyroidism - Continue Synthroid.    Hypertension - Continue hydralazine as needed.    Dysphagia / severe protein calorie malnutrition - Seen by speech therapy with recommendations for dysphasia 3 diet with thin liquids. - Continue nutritional supplements.    CKD (chronic kidney disease), stage II - Renal function stable, at usual baseline.    Anemia, chronic disease - Hemoglobin stable with no current indication for transfusion.    UTI (lower urinary tract infection) / possible sepsis - Recent cultures positive for Pseudomonas. Plan for 2 week course of therapy, to complete 04/13/15. - Repeat urine culture 04-Apr-2015 negative. - Continue Cipro.  Have changed back to IV route since not consistently taking POs and too lethargic at times to take.    Shingles - Being treated with acyclovir through 04/07/15.   Dehydration - Resolved with IV fluids.    DVT Prophylaxis - Continue subcutaneous Lovenox.  Code Status: Full. Family Communication: No family currently present.  [name] called at [phone] & updated 04/04/15.   Disposition Plan: Home with son when stable, likely several more days.  Still very lethargic.   IV Access:    Peripheral IV   Procedures and diagnostic studies:   .Ct Head Wo Contrast  Apr 04, 2015   CLINICAL DATA:  Mental status change.  Not as responsive.  EXAM: CT HEAD WITHOUT CONTRAST  TECHNIQUE: Contiguous axial images were obtained from the base of the skull through the vertex without intravenous contrast.  COMPARISON:  Head CT 04/12/2014  FINDINGS: Patient is extremely kyphotic therefore non standard orientation was scanned. The generates bone artifact.  No acute intracranial hemorrhage. No focal mass lesion. No CT evidence of acute infarction. No midline shift or mass effect. No hydrocephalus. Basilar cisterns are patent.  Generalized cortical atrophy and ventricular dilatation.  Paranasal sinuses and mastoid air cells are clear. Chronic opacification of the sphenoid sinus.  IMPRESSION: 1. No acute intracranial findings. Nonstandard positioning does limit evaluation. 2. Chronic atrophy and microvascular disease. 3. Chronic opacification of the sphenoid sinuses air cells   Electronically Signed   By: Suzy Bouchard M.D.   On: 2015-04-04 20:35   Dg Chest Portable 1 View  04/04/15   CLINICAL DATA:  Recent discharge from hospital for shingles an urinary tract infection. Altered mental status.  EXAM: PORTABLE CHEST - 1 VIEW  COMPARISON:  03/25/2015 and 01/10/2015  FINDINGS: Patient is rotated to the left with chin/head overlying the left apex. Lungs are adequately inflated without consolidation or effusion. Small nodular opacity over the right midlung and  left midlung unchanged. Mild stable cardiomegaly. Mild calcified plaque over the aortic arch. Remainder of the exam is  unchanged.  IMPRESSION: No acute cardiopulmonary disease.  Stable small bilateral nodules unchanged as agree with previous recommendation of chest CT.  Mild cardiomegaly.   Electronically Signed   By: Marin Olp M.D.   On: 03/31/2015 19:30   Dg Chest Port 1 View  03/25/2015   CLINICAL DATA:  Syncopal episode  EXAM: PORTABLE CHEST - 1 VIEW  COMPARISON:  01/10/2015  FINDINGS: Examination is somewhat limited secondary to the patient's inability to adequately position herself. The cardiac shadow is mildly enlarged but stable. The lungs are well aerated bilaterally. The previously seen nodular densities are not as well appreciated on today's exam. No focal confluent infiltrate is seen.  IMPRESSION: Limited exam without acute abnormality.   Electronically Signed   By: Inez Catalina M.D.   On: 03/25/2015 15:14     Medical Consultants:    None.  Anti-Infectives:    Vanc 5/14 --> 5/17  maxipime 5/14 --> 5/17  Cipro 5/17 --> 5/27  Acyclovir 6/16 --> 5/21  Subjective:   Ricky [name] is sitting up, being fed by nursing tech.  He only ate a few bites of pudding and fish.  He was too lethargic to be fed breakfast.  Minimally verbal, but denies pain, nausea.  Objective:    Filed Vitals:   04/04/15 1309 04/04/15 2205 04/05/15 0219 04/05/15 0433  BP: 159/82 115/65 121/69 137/57  Pulse: 71 88 76 67  Temp: 98 F (36.7 C) 97.4 F (36.3 C) 97.8 F (36.6 C) 97.4 F (36.3 C)  TempSrc: Axillary Axillary Axillary Axillary  Resp: 18 18 18 18   Height:      Weight:      SpO2: 96% 95% 96% 97%    Intake/Output Summary (Last 24 hours) at 04/05/15 0725 Last data filed at 04/05/15 0706  Gross per 24 hour  Intake 1298.43 ml  Output    800 ml  Net 498.43 ml    Exam: Gen:  NAD, lethargic Cardiovascular:  RRR, III/VI SEM Respiratory:  Lungs diminished Gastrointestinal:  Abdomen soft, NT/ND, + BS Extremities:  No C/E/C, upper extremity contractures, 1+ edema BLE   Data Reviewed:     Labs: Basic Metabolic Panel:  Recent Labs Lab 03/31/15 2020 04/01/15 0140 04/02/15 0449 04/03/15 0812 04/04/15 0435  NA 136 137 136 132* 135  K 4.3 3.7 3.1* 3.9 3.6  CL 99* 103 102 101 102  CO2 27 25 25 25 24   GLUCOSE 101* 98 80 93 95  BUN 16 13 8 9 11   CREATININE 1.06 0.71 0.58* 0.49* 0.57*  CALCIUM 9.4 8.8* 8.7* 8.1* 8.6*   GFR Estimated Creatinine Clearance: 44.2 mL/min (by C-G formula based on Cr of 0.57). Liver Function Tests:  Recent Labs Lab 03/31/15 2020  AST 31  ALT 19  ALKPHOS 65  BILITOT 0.6  PROT 7.5  ALBUMIN 3.5   Coagulation profile  Recent Labs Lab 04/01/15 0335 04/01/15 0602  INR SPECIMEN CLOTTED 1.26    CBC:  Recent Labs Lab 03/31/15 2020 04/01/15 0140 04/02/15 0449 04/03/15 0812 04/04/15 0435  WBC 6.7 6.7 5.2 4.7 4.5  NEUTROABS 5.4  --   --   --   --   HGB 12.3* 11.1* 10.9* 12.1* 11.0*  HCT 38.1* 34.7* 32.9* 37.0* 34.2*  MCV 89.9 89.2 89.4 87.9 88.6  PLT 212 197 199 220 207   Cardiac Enzymes:  Recent Labs Lab 03/31/15  2020  TROPONINI 0.05*   Sepsis Labs:  Recent Labs Lab 03/31/15 2023 03/31/15 2259 04/01/15 0140 04/01/15 0335 04/02/15 0449 04/03/15 0812 04/04/15 0435  PROCALCITON  --   --  <0.10  --   --   --   --   WBC  --   --  6.7  --  5.2 4.7 4.5  LATICACIDVEN 3.25* 2.02* 1.9 1.3  --   --   --    Microbiology Recent Results (from the past 240 hour(s))  Culture, blood (routine x 2)     Status: None (Preliminary result)   Collection Time: 03/31/15  8:23 PM  Result Value Ref Range Status   Specimen Description BLOOD BLOOD RIGHT FOREARM UPPER  Final   Special Requests BOTTLES DRAWN AEROBIC AND ANAEROBIC 5CC  Final   Culture   Final           BLOOD CULTURE RECEIVED NO GROWTH TO DATE CULTURE WILL BE HELD FOR 5 DAYS BEFORE ISSUING A FINAL NEGATIVE REPORT Performed at [facility]    Report Status PENDING  Incomplete  Culture, blood (routine x 2)     Status: None (Preliminary result)   Collection  Time: 03/31/15  8:23 PM  Result Value Ref Range Status   Specimen Description BLOOD RIGHT HAND  Final   Special Requests BOTTLES DRAWN AEROBIC AND ANAEROBIC 5CC  Final   Culture   Final           BLOOD CULTURE RECEIVED NO GROWTH TO DATE CULTURE WILL BE HELD FOR 5 DAYS BEFORE ISSUING A FINAL NEGATIVE REPORT Performed at [facility]    Report Status PENDING  Incomplete  Urine culture     Status: None   Collection Time: 03/31/15  9:10 PM  Result Value Ref Range Status   Specimen Description URINE, CATHETERIZED  Final   Special Requests NONE  Final   Colony Count NO GROWTH Performed at [facility]   Final   Culture NO GROWTH Performed at [facility]   Final   Report Status 04/01/2015 FINAL  Final     Medications:   . acyclovir  10 mg/kg Intravenous Q8H  . ciprofloxacin  500 mg Oral BID  . enoxaparin (LOVENOX) injection  40 mg Subcutaneous Q24H  . feeding supplement (ENSURE ENLIVE)  237 mL Oral BID BM  . levothyroxine  12.5 mcg Intravenous Daily  . lidocaine  1 patch Transdermal Q24H   Continuous Infusions: . 0.9 % NaCl with KCl 40 mEq / L 50 mL/hr (04/04/15 1255)    Time spent: 25 minutes.   LOS: 5 days   [facility] Pager [phone]. If unable to reach me by pager, please call my cell phone at [phone].  *Please refer to [email or web address], password [email or web address] to get updated schedule on who will round on this patient, as hospitalists switch teams weekly. If 7PM-7AM, please contact night-coverage at [email or web address], password [email or web address] for any overnight needs.  04/05/2015, 7:25 AM

## 2015-04-05 NOTE — Care Management Note (Signed)
Case Management Note  Patient Details  Name: Ricky [name] MRN: [id number] Date of Birth: October 10, 1917  Subjective/Objective:                    Action/Plan:   Expected Discharge Date:                  Expected Discharge Plan:  [place] (Awaiting MD to discuss discharge plans with son.[facility] care-HHRN/PT; )  In-House Referral:     Discharge planning Services  CM Consult  Post Acute Care Choice:  Home Health [facility] home care) Choice offered to:  Adult Children (Spoke to son [name] about d/c plans-wants home w/HHRN/PT, & wants transport chair.)  DME Arranged:    DME Agency:     HH Arranged:    HH Agency:  [facility]  Status of Service:  In process, will continue to follow  Medicare Important Message Given:  Yes Date Medicare IM Given:  04/03/15 Medicare IM give by:  Dessa Phi Date Additional Medicare IM Given:    Additional Medicare Important Message give by:     If discussed at [place] of Stay Meetings, dates discussed:  04/05/15  Additional Comments:  Dessa Phi, RN 04/05/2015, 12:07 PM

## 2015-04-05 NOTE — Evaluation (Signed)
Occupational Therapy Evaluation Patient Details Name: Ricky [name] MRN: [id number] DOB: [birth year] Today's Date: 04/05/2015    History of Present Illness Pt was admitted with decreased LOC.  He has a h/o supranuclear palsy and recent shingles.     Clinical Impression   This 81 year old man was admitted for the above.  Met son, who performs all ADLs and transfers for pt.  Son is concerned with contractures/decreased ability to hold a cup.  He had pursed splints and put this on hold (through [employer]).  Recommended that he get these for a prolonged stretch on an on/off basis.  Provided palm guard for this visit.  HHOT can monitor splints and establish a schedule.  Son feels comfortable with ROM/stretch as well as ADLs and transfers.  No further acute OT needs.    Follow Up Recommendations  Home health OT (for splint tolerance/schedule)    Equipment Recommendations       Recommendations for Other Services       Precautions / Restrictions Precautions Precautions: Fall Precaution Comments: pt with multiple UE contractures Restrictions Weight Bearing Restrictions: No      Mobility Bed Mobility                  Transfers                      Balance                                            ADL Overall ADL's : At baseline                                       General ADL Comments: except for drinking.  Pt was total A for adls except he could manipulate cup with set up to drink.  Talked to son. He has been unable to supinate but was using L as assist with pronation for cup.  A lidded mug with handle has not worked for him. Pt did not perform any AROM during evaluation and he is tight with contractures.   Son feels proficient with PROM:  had HHOT.  He was in the process of getting splints but put this on hold.  I agreed that splints may help on an intermittent basis for prolonged stretch (on/off schedule). He has been using  rolled washcloths.  Provided palm guards as alternative and educated Therapist, sports on application. Sign posted to remove daily during adls and check skin.     Vision     Perception     Praxis      Pertinent Vitals/Pain Pain Assessment: Faces Faces Pain Scale: No hurt     Hand Dominance Right   Extremity/Trunk Assessment Upper Extremity Assessment Upper Extremity Assessment: RUE deficits/detail;LUE deficits/detail RUE Deficits / Details: Unable to move R shoulder; elbow at 90, IV at wrist, able to open fingers partially (did not flex wrist to loosen fingers due to IV) LUE Deficits / Details: shoulder ROM 20 degrees; elbow flexion 90, tight; supination to neutral; able to flex wrist and loosen flexed fingers, but 4th and 5th digits are tight       Cervical / Trunk Assessment Cervical / Trunk Assessment:  (neck and head are forward flexed and lean to L)   Communication  Communication Communication:  (non-verbal; no non-verbal communication)   Cognition Arousal/Alertness: Lethargic (eyes opened)   Overall Cognitive Status: Difficult to assess                     General Comments       Exercises       Shoulder Instructions      Home Living Family/patient expects to be discharged to:: Private residence Living Arrangements: Children                               Additional Comments: will have [facility] services      Prior Functioning/Environment Level of Independence: Needs assistance        Comments: pt needs total A for ADLs except he was able to hold cup and drink with set up.  Recently, pt was unable to supinate LUE to neutral so used the back of his hand (dorsum) as an assist    OT Diagnosis: Generalized weakness   OT Problem List: Decreased strength;Impaired UE functional use;Decreased knowledge of use of DME or AE;Decreased range of motion   OT Treatment/Interventions:      OT Goals(Current goals can be found in the care plan section) Acute  Rehab OT Goals Patient Stated Goal: son's:  get pt back to where he can drink so son can be freed up and pt can meet hydration needs  OT Frequency:     Barriers to D/C:            Co-evaluation              End of Session    Activity Tolerance: Patient tolerated treatment well;No increased pain Patient left: in bed   Time: 9767-3419 and 1540-1545 OT Time Calculation (min): 21 min Charges:  OT General Charges $OT Visit: 1 Procedure OT Evaluation $Initial OT Evaluation Tier I: 1 Procedure G-Codes:    Ricky [name] 04/28/15, 3:46 PM Lesle Chris, OTR/L [phone] Apr 28, 2015

## 2015-04-05 NOTE — Progress Notes (Signed)
OT Cancellation Note  Patient Details Name: Ricky [name] MRN: [id number] DOB: Feb 26, 1917   Cancelled Treatment:    Reason Eval/Treat Not Completed: Other (comment).  Pt remains lethargic.  Left department number for nursing to page [place] if son is around so that we can perform family education.  Shanti Eichel 04/05/2015, 12:44 PM  Lesle Chris, OTR/L [phone] 04/05/2015

## 2015-04-05 NOTE — Care Management Note (Signed)
Case Management Note  Patient Details  Name: Ricky [name] MRN: [id number] Date of Birth: 1916/11/29  Subjective/Objective:  MD states will not pursue Palliative Care at this time.                   Action/Plan: D/C plan is for home with HHPT/RN and Transport Chair when medically stable.   Expected Discharge Date:                  Expected Discharge Plan:  [place] (.[facility] care-HHRN/PT; )  In-House Referral:     Discharge planning Services  CM Consult  Post Acute Care Choice:  Home Health [facility] home care) Choice offered to:  Adult Children (Spoke to son [name] about d/c plans-wants home w/HHRN/PT, & wants transport chair.)  DME Arranged:  Other see comment Optometrist to be delivered  to Home by [facility]) DME Agency:  [place]. ([place] DME Rep aware of order)  HH Arranged:  RN, PT HH Agency:  [place]  Status of Service:  In process, will continue to follow  Medicare Important Message Given:  Yes Date Medicare IM Given:  04/03/15 Medicare IM give by:  Dessa Phi Date Additional Medicare IM Given:    Additional Medicare Important Message give by:     If discussed at [place] of Stay Meetings, dates discussed:    Additional Comments:  Dessa Phi, RN 04/05/2015, 1:36 PM

## 2015-04-06 DIAGNOSIS — G9340 Encephalopathy, unspecified: Secondary | ICD-10-CM

## 2015-04-06 DIAGNOSIS — N179 Acute kidney failure, unspecified: Secondary | ICD-10-CM | POA: Diagnosis present

## 2015-04-06 LAB — BASIC METABOLIC PANEL
Anion gap: 6 (ref 5–15)
BUN: 18 mg/dL (ref 6–20)
CO2: 25 mmol/L (ref 22–32)
Calcium: 8.8 mg/dL — ABNORMAL LOW (ref 8.9–10.3)
Chloride: 107 mmol/L (ref 101–111)
Creatinine, Ser: 1.42 mg/dL — ABNORMAL HIGH (ref 0.61–1.24)
GFR calc Af Amer: 47 mL/min — ABNORMAL LOW (ref >60)
GFR calc non Af Amer: 40 mL/min — ABNORMAL LOW (ref >60)
GLUCOSE: 101 mg/dL — AB (ref 65–99)
POTASSIUM: 3.7 mmol/L (ref 3.5–5.1)
Sodium: 138 mmol/L (ref 135–145)

## 2015-04-06 MED ORDER — SODIUM CHLORIDE 0.9 % IV SOLN
INTRAVENOUS | Status: DC
  Administered 2015-04-06 – 2015-04-07 (×3): via INTRAVENOUS
  Administered 2015-04-07: 100 mL/h via INTRAVENOUS

## 2015-04-06 MED ORDER — DEXTROSE 5 % IV SOLN
10.0000 mg/kg | Freq: Two times a day (BID) | INTRAVENOUS | Status: AC
  Administered 2015-04-06: 565 mg via INTRAVENOUS
  Filled 2015-04-06: qty 11.3

## 2015-04-06 MED ORDER — CIPROFLOXACIN IN D5W 400 MG/200ML IV SOLN
400.0000 mg | INTRAVENOUS | Status: DC
  Administered 2015-04-07 – 2015-04-09 (×3): 400 mg via INTRAVENOUS
  Filled 2015-04-06 (×3): qty 200

## 2015-04-06 MED ORDER — ENOXAPARIN SODIUM 30 MG/0.3ML ~~LOC~~ SOLN
30.0000 mg | SUBCUTANEOUS | Status: DC
  Administered 2015-04-06 – 2015-04-08 (×3): 30 mg via SUBCUTANEOUS
  Filled 2015-04-06 (×4): qty 0.3

## 2015-04-06 NOTE — Progress Notes (Signed)
ANTIBIOTIC CONSULT NOTE - FOLLOW UP  Pharmacy Consult for Cipro, Acyclovir Indication: urosepsis, shingles  Allergies  Allergen Reactions  . Other Other (See Comments)    Dairy products- cause swallowing difficulty     Patient Measurements: Height: 6\' 2"  (188 cm) Weight: 124 lb 12.5 oz (56.6 kg) IBW/kg (Calculated) : 82.2  Vital Signs: Temp: 97.5 F (36.4 C) (05/20 0457) Temp Source: Axillary (05/20 0457) BP: 145/75 mmHg (05/20 0457) Pulse Rate: 55 (05/20 0457) Intake/Output from previous day: 05/19 0701 - 05/20 0700 In: 2732.7 [P.O.:300; I.V.:1587.5; IV Piggyback:845.2] Out: 975 [Urine:975]  Labs:  Recent Labs  04/03/15 0812 04/04/15 0435 04/06/15 0400  WBC 4.7 4.5  --   HGB 12.1* 11.0*  --   PLT 220 207  --   CREATININE 0.49* 0.57* 1.42*   Estimated Creatinine Clearance: 24.9 mL/min (by C-G formula based on Cr of 1.42).  Assessment 81 yo male presents to ER with AMS after just being discharged 5/13 and treated for pseudomonas UTI, dehydration and shingles. Patient with advanced dementia and catheter in place per notes.  Note that 5/8 urine culture grew pseudomonas that was pansensitive and was treated with Cipro. Patient also had pseudomonas in urine back in feb as well as enterococcus.  Pharmacy was initially consulted to dose Vancomycin and Cefepime, but now narrowed to Cipro alone.  Note patient's treatment for shingles was started on last admission and was discharged with a 7 day course of Valtrex 4/14 - 4/20. Pre Md order, to continue to treat shingles with IV acyclovir while patient NPO inpatient.   5/8 >> CTX >> 5/9 5/10 >> Cipro >> 5/13 5/14 >> cefepime >> 5/17 5/14 >> vanc >> 5/17 5/8 >> Valtrex >> 5/14 (supposed to start 5/14 and end 5/20, not cont on admission) 5/16 >> IV acyclovir >> 5/20 5/17 >> Cipro (MD) >>   Micro: 5/14 Blood: 1/2 ngtd, 1/2 micrococcus 5/14 Urine: NGF  Today, 04/06/2015:   Day #13 antibiotics, D4 Cipro, D15 total  acyclovir  Temp: afebrile  WBC: wnl (5/18)  Renal: SCr significantly increased, CrCl 25 ml/min CG, ~31 ml/min/1.90m2  Goal of Therapy:  Appropriate abx dosing, eradication of infection.   Plan:   Reduce Cipro to 400mg  IV q24h  Reduce Acyclovir to 565 mg IV q12h (finishes course on 5/20 after one more dose)  Follow clinical course, renal function, culture results as available  Follow for de-escalation of antibiotics and LOT   Gretta Arab PharmD, BCPS Pager [phone] 04/06/2015 8:09 AM

## 2015-04-06 NOTE — Progress Notes (Signed)
Nutrition Follow-up  DOCUMENTATION CODES:  Severe malnutrition in context of chronic illness, Underweight  INTERVENTION: - Continue Ensure Enlive BID - Should PEG be placed, recommend Jevity 1.2 @ 50 mL/hr to provide 1200 kcal, 67 grams protein, and 968 mL free water  NUTRITION DIAGNOSIS:  Malnutrition related to chronic illness as evidenced by severe depletion of muscle mass, moderate depletion of body fat. -ongoing  GOAL:  Patient will meet greater than or equal to 90% of their needs -unmet  MONITOR:  PO intake, Supplement acceptance, Weight trends, Labs, I & O's  ASSESSMENT: Pt eating 0-10% of meals since admission on dysphagia 3 diet, thin liquids.   Per rounds this AM, family has kept pt full code. MD states that should family continue to want aggressive care, PEG will need to be placed as pt unable to meet fluid and energy needs PO. TF recommendations as above should this be within POC and family wishes.  Not meeting needs. Medications reviewed. Labs reviewed; creatinine elevated, GFR: 47.  Height:  Ht Readings from Last 1 Encounters:  04/03/15 6\' 2"  (1.88 m)    Weight:  Wt Readings from Last 1 Encounters:  04/03/15 124 lb 12.5 oz (56.6 kg)    Ideal Body Weight:  86.4 kg (kg)  Wt Readings from Last 10 Encounters:  04/03/15 124 lb 12.5 oz (56.6 kg)  03/25/15 115 lb (52.164 kg)  01/11/15 115 lb (52.164 kg)  11/22/14 115 lb (52.164 kg)  06/28/14 160 lb 3.3 oz (72.67 kg)  04/20/14 150 lb (68.04 kg)  03/31/14 141 lb 8 oz (64.184 kg)  03/29/14 158 lb (71.668 kg)  03/28/14 153 lb 11.2 oz (69.718 kg)  03/13/14 145 lb 15.1 oz (66.2 kg)    BMI:  Body mass index is 16.01 kg/(m^2).  Estimated Nutritional Needs:  Kcal:  1200-1400  Protein:  60-80 grams  Fluid:  2L/day  Skin:  Wound (see comment)  Diet Order:  DIET DYS 3 Room service appropriate?: No; Fluid consistency:: Thin  EDUCATION NEEDS:  No education needs identified at this  time   Intake/Output Summary (Last 24 hours) at 04/06/15 1227 Last data filed at 04/06/15 0600  Gross per 24 hour  Intake 2512.7 ml  Output    875 ml  Net 1637.7 ml    Last BM:  5/19   Jarome Matin, RD, LDN Inpatient Clinical Dietitian Pager # [phone] After hours/weekend pager # [phone]

## 2015-04-06 NOTE — Progress Notes (Addendum)
Progress Note   Ricky [name] NWG:[id number] DOB: July 01, 1917 DOA: Apr 07, 2015 PCP: Hollace Kinnier, DO   Brief Narrative:   Ricky [name] is an 81 y.o. male with a PMH of progressive supranuclear palsy, hypertension, and hypothyroidism, recent hospitalization 03/25/15-03/30/15 for treatment of UTI and shingles who was readmitted 04-07-15 secondary to increased lethargy/decreased LOC. He had been discharged home on Cipro for treatment of pseudomonal UTI.  Assessment/Plan:   Principal Problem:   Acute Toxic encephalopathy, infectious versus progressive supranuclear palsy / decreased level of consciousness / failure to thrive in adult - Previous urine culture positive for Pseudomonas. Antibiotics initially broad-spectrum, narrowed to Cipro 04/03/15. - Would benefit from palliative care involvement. According to PCP, this has been discussed and family has resisted. - Catheter changed 2015-04-07.  Active Problems:   Functional quadriplegia secondary to supranuclear palsy - PT/OT ordered. Has been too lethargic to participate. - Continues to require 2 person assist.    Hypothyroidism - Continue Synthroid.    Hypertension - Continue hydralazine as needed.    Dysphagia / severe protein calorie malnutrition - Seen by speech therapy with recommendations for dysphasia 3 diet with thin liquids. - Continue nutritional supplements.    CKD (chronic kidney disease), stage II - Renal function stable, at usual baseline.    Anemia, chronic disease - Hemoglobin stable with no current indication for transfusion.    UTI (lower urinary tract infection) / possible sepsis - Recent cultures positive for Pseudomonas. Plan for 2 week course of therapy, to complete 04/13/15. - Repeat urine culture 2015-04-07 negative. - Continue Cipro.  Have changed back to IV route since not consistently taking POs and too lethargic at times to take.    Shingles - Being treated with acyclovir through 04/07/15.    AKI  secondary to dehydration - Resolved with IV fluids.  Recurred after IVF discontinued.  May need to consider placement of a feeding tube if family continues to want aggressive care, since the patient is unable to maintain his own nutritional and fluid intake.    DVT Prophylaxis - Continue subcutaneous Lovenox.  Code Status: Full. Family Communication: [name] updated at bedside 04/05/15.  Called and left message today. Disposition Plan: Home with son when stable, likely 1-2 days.  May need PEG if family wants ongoing aggressive care since he seems to have recurrent problems with dehydration.   IV Access:    Peripheral IV   Procedures and diagnostic studies:   .Ct Head Wo Contrast  07-Apr-2015   CLINICAL DATA:  Mental status change.  Not as responsive.  EXAM: CT HEAD WITHOUT CONTRAST  TECHNIQUE: Contiguous axial images were obtained from the base of the skull through the vertex without intravenous contrast.  COMPARISON:  Head CT 04/12/2014  FINDINGS: Patient is extremely kyphotic therefore non standard orientation was scanned. The generates bone artifact.  No acute intracranial hemorrhage. No focal mass lesion. No CT evidence of acute infarction. No midline shift or mass effect. No hydrocephalus. Basilar cisterns are patent.  Generalized cortical atrophy and ventricular dilatation.  Paranasal sinuses and mastoid air cells are clear. Chronic opacification of the sphenoid sinus.  IMPRESSION: 1. No acute intracranial findings. Nonstandard positioning does limit evaluation. 2. Chronic atrophy and microvascular disease. 3. Chronic opacification of the sphenoid sinuses air cells   Electronically Signed   By: Suzy Bouchard M.D.   On: Apr 07, 2015 20:35   Dg Chest Portable 1 View  04/07/15   CLINICAL DATA:  Recent discharge from hospital for shingles  an urinary tract infection. Altered mental status.  EXAM: PORTABLE CHEST - 1 VIEW  COMPARISON:  03/25/2015 and 01/10/2015  FINDINGS: Patient is rotated  to the left with chin/head overlying the left apex. Lungs are adequately inflated without consolidation or effusion. Small nodular opacity over the right midlung and left midlung unchanged. Mild stable cardiomegaly. Mild calcified plaque over the aortic arch. Remainder of the exam is unchanged.  IMPRESSION: No acute cardiopulmonary disease.  Stable small bilateral nodules unchanged as agree with previous recommendation of chest CT.  Mild cardiomegaly.   Electronically Signed   By: Marin Olp M.D.   On: 03/31/2015 19:30   Dg Chest Port 1 View  03/25/2015   CLINICAL DATA:  Syncopal episode  EXAM: PORTABLE CHEST - 1 VIEW  COMPARISON:  01/10/2015  FINDINGS: Examination is somewhat limited secondary to the patient's inability to adequately position herself. The cardiac shadow is mildly enlarged but stable. The lungs are well aerated bilaterally. The previously seen nodular densities are not as well appreciated on today's exam. No focal confluent infiltrate is seen.  IMPRESSION: Limited exam without acute abnormality.   Electronically Signed   By: Inez Catalina M.D.   On: 03/25/2015 15:14     Medical Consultants:    None.  Anti-Infectives:    Vanc 5/14 --> 5/17  maxipime 5/14 --> 5/17  Cipro 5/17 --> 5/27  Acyclovir 6/16 --> 5/21  Subjective:   Ricky [name] is sitting up, non-verbal with one word answers.  RN reports appetite has improved & that he is more awake.  Bowels moving.  Objective:    Filed Vitals:   04/05/15 1343 04/05/15 2043 04/06/15 0143 04/06/15 0457  BP: 141/67 136/72 131/89 145/75  Pulse: 75 60 62 55  Temp: 98.9 F (37.2 C) 98.4 F (36.9 C) 98.8 F (37.1 C) 97.5 F (36.4 C)  TempSrc: Oral Axillary Axillary Axillary  Resp: 16 21 20 18   Height:      Weight:      SpO2: 97% 97% 95% 97%    Intake/Output Summary (Last 24 hours) at 04/06/15 0717 Last data filed at 04/06/15 0600  Gross per 24 hour  Intake 2732.7 ml  Output    875 ml  Net 1857.7 ml     Exam: Gen:  NAD, less lethargic Cardiovascular:  RRR, III/VI SEM Respiratory:  Lungs diminished Gastrointestinal:  Abdomen soft, NT/ND, + BS Extremities:  No C/E/C, upper extremity contractures, 1+ edema BLE   Data Reviewed:    Labs: Basic Metabolic Panel:  Recent Labs Lab 04/01/15 0140 04/02/15 0449 04/03/15 0812 04/04/15 0435 04/06/15 0400  NA 137 136 132* 135 138  K 3.7 3.1* 3.9 3.6 3.7  CL 103 102 101 102 107  CO2 25 25 25 24 25   GLUCOSE 98 80 93 95 101*  BUN 13 8 9 11 18   CREATININE 0.71 0.58* 0.49* 0.57* 1.42*  CALCIUM 8.8* 8.7* 8.1* 8.6* 8.8*   GFR Estimated Creatinine Clearance: 24.9 mL/min (by C-G formula based on Cr of 1.42). Liver Function Tests:  Recent Labs Lab 03/31/15 2020  AST 31  ALT 19  ALKPHOS 65  BILITOT 0.6  PROT 7.5  ALBUMIN 3.5   Coagulation profile  Recent Labs Lab 04/01/15 0335 04/01/15 0602  INR SPECIMEN CLOTTED 1.26    CBC:  Recent Labs Lab 03/31/15 2020 04/01/15 0140 04/02/15 0449 04/03/15 0812 04/04/15 0435  WBC 6.7 6.7 5.2 4.7 4.5  NEUTROABS 5.4  --   --   --   --  HGB 12.3* 11.1* 10.9* 12.1* 11.0*  HCT 38.1* 34.7* 32.9* 37.0* 34.2*  MCV 89.9 89.2 89.4 87.9 88.6  PLT 212 197 199 220 207   Cardiac Enzymes:  Recent Labs Lab 03/31/15 2020  TROPONINI 0.05*   Sepsis Labs:  Recent Labs Lab 03/31/15 2023 03/31/15 2259 04/01/15 0140 04/01/15 0335 04/02/15 0449 04/03/15 0812 04/04/15 0435  PROCALCITON  --   --  <0.10  --   --   --   --   WBC  --   --  6.7  --  5.2 4.7 4.5  LATICACIDVEN 3.25* 2.02* 1.9 1.3  --   --   --    Microbiology Recent Results (from the past 240 hour(s))  Culture, blood (routine x 2)     Status: None (Preliminary result)   Collection Time: 03/31/15  8:23 PM  Result Value Ref Range Status   Specimen Description BLOOD BLOOD RIGHT FOREARM UPPER  Final   Special Requests BOTTLES DRAWN AEROBIC AND ANAEROBIC 5CC  Final   Culture   Final           BLOOD CULTURE RECEIVED NO  GROWTH TO DATE CULTURE WILL BE HELD FOR 5 DAYS BEFORE ISSUING A FINAL NEGATIVE REPORT Performed at [facility]    Report Status PENDING  Incomplete  Culture, blood (routine x 2)     Status: None   Collection Time: 03/31/15  8:23 PM  Result Value Ref Range Status   Specimen Description BLOOD RIGHT HAND  Final   Special Requests BOTTLES DRAWN AEROBIC AND ANAEROBIC 5CC  Final   Culture   Final    MICROCOCCUS SPECIES Note: Standardized susceptibility testing for this organism is not available. Note: CRITICAL RESULT CALLED TO, READ BACK BY AND VERIFIED WITH: KATIE D 04/05/15 BY INGRAM A 1031AM Performed at [facility]    Report Status 04/05/2015 FINAL  Final  Urine culture     Status: None   Collection Time: 03/31/15  9:10 PM  Result Value Ref Range Status   Specimen Description URINE, CATHETERIZED  Final   Special Requests NONE  Final   Colony Count NO GROWTH Performed at [facility]   Final   Culture NO GROWTH Performed at [facility]   Final   Report Status 04/01/2015 FINAL  Final     Medications:   . acyclovir  10 mg/kg Intravenous Q8H  . ciprofloxacin  400 mg Intravenous Q12H  . enoxaparin (LOVENOX) injection  40 mg Subcutaneous Q24H  . feeding supplement (ENSURE ENLIVE)  237 mL Oral BID BM  . levothyroxine  12.5 mcg Intravenous Daily  . lidocaine  1 patch Transdermal Q24H   Continuous Infusions: . 0.9 % NaCl with KCl 40 mEq / L 50 mL/hr (04/05/15 1147)    Time spent: 25 minutes.   LOS: 6 days   [facility] Pager [phone]. If unable to reach me by pager, please call my cell phone at [phone].  *Please refer to [email or web address], password [email or web address] to get updated schedule on who will round on this patient, as hospitalists switch teams weekly. If 7PM-7AM, please contact night-coverage at [email or web address], password [email or web address] for any overnight needs.  04/06/2015, 7:17 AM

## 2015-04-06 NOTE — Progress Notes (Signed)
PT Cancellation Note / Screen  Patient Details Name: Ricky [name] MRN: [id number] DOB: Dec 14, 1916   Cancelled Treatment:    Reason Eval/Treat Not Completed: PT screened, no needs identified, will sign off Pt is total assist for all care/mobility. Pt is not appropriate for skilled PT at acute level. Since plan is for pt to d/c home, hoyer lift and hospital bed may be beneficial for transfers (recommendation discussed with CM), and family could potentialy benefit from [place] for education within home environment. Will sign off.    Harveer Sadler,KATHrine E 04/06/2015, 9:32 AM Carmelia Bake, PT, DPT 04/06/2015 Pager: [phone]

## 2015-04-07 LAB — CULTURE, BLOOD (ROUTINE X 2): CULTURE: NO GROWTH

## 2015-04-07 MED ORDER — ENSURE ENLIVE PO LIQD
237.0000 mL | Freq: Three times a day (TID) | ORAL | Status: DC
  Administered 2015-04-07 – 2015-04-11 (×11): 237 mL via ORAL

## 2015-04-07 NOTE — Progress Notes (Addendum)
Progress Note   RILLEY STASH [name]:[id number] DOB: 11-Dec-1916 DOA: 18-Apr-2015 PCP: Hollace Kinnier, DO   Brief Narrative:   Ricky [name] is an 81 y.o. male with a PMH of progressive supranuclear palsy, hypertension, and hypothyroidism, recent hospitalization 03/25/15-03/30/15 for treatment of UTI and shingles who was readmitted 2015/04/18 secondary to increased lethargy/decreased LOC. He had been discharged home on Cipro for treatment of pseudomonal UTI.  Assessment/Plan:   Principal Problem:   Acute Toxic encephalopathy, infectious versus progressive supranuclear palsy / decreased level of consciousness / failure to thrive in adult - Previous urine culture positive for Pseudomonas. Antibiotics initially broad-spectrum, narrowed to Cipro 04/03/15. - Would benefit from palliative care involvement. According to PCP, this has been discussed and family has resisted. - Catheter changed April 18, 2015. - Family declines feeding tube.   Active Problems:   Functional quadriplegia secondary to supranuclear palsy - PT/OT ordered. Has been too lethargic to participate. - Continues to require 2 person assist.    Hypothyroidism - Continue Synthroid.    Hypertension - Continue hydralazine as needed.    Dysphagia / severe protein calorie malnutrition - Seen by speech therapy with recommendations for dysphasia 3 diet with thin liquids. - Continue nutritional supplements. - Family declines feeding tube.    CKD (chronic kidney disease), stage II - Renal function noted to be slightly worse 04/06/15, IV fluids resumed. - Re-check BMET in a.m.    Anemia, chronic disease - Hemoglobin stable with no current indication for transfusion.    UTI (lower urinary tract infection) / possible sepsis - Recent cultures positive for Pseudomonas. Plan for 2 week course of therapy, to complete 04/13/15. - Repeat urine culture April 18, 2015 negative. - Continue Cipro.  Have changed back to IV route since not consistently  taking POs and too lethargic at times to take.    Shingles - Completed his acyclovir course.    AKI secondary to dehydration - Resolved with IV fluids.  Recurred after IVF discontinued.  May need to consider placement of a feeding tube if family continues to want aggressive care, since the patient is unable to maintain his own nutritional and fluid intake.    DVT Prophylaxis - Continue subcutaneous Lovenox.  Code Status: Full. Family Communication: [name] (son) and [name] (daughter) updated by telephone.   Disposition Plan: Home with son when stable, likely 1-2 days.    IV Access:    Peripheral IV   Procedures and diagnostic studies:   .Ct Head Wo Contrast  04-18-15   CLINICAL DATA:  Mental status change.  Not as responsive.  EXAM: CT HEAD WITHOUT CONTRAST  TECHNIQUE: Contiguous axial images were obtained from the base of the skull through the vertex without intravenous contrast.  COMPARISON:  Head CT 04/12/2014  FINDINGS: Patient is extremely kyphotic therefore non standard orientation was scanned. The generates bone artifact.  No acute intracranial hemorrhage. No focal mass lesion. No CT evidence of acute infarction. No midline shift or mass effect. No hydrocephalus. Basilar cisterns are patent.  Generalized cortical atrophy and ventricular dilatation.  Paranasal sinuses and mastoid air cells are clear. Chronic opacification of the sphenoid sinus.  IMPRESSION: 1. No acute intracranial findings. Nonstandard positioning does limit evaluation. 2. Chronic atrophy and microvascular disease. 3. Chronic opacification of the sphenoid sinuses air cells   Electronically Signed   By: Suzy Bouchard M.D.   On: April 18, 2015 20:35   Dg Chest Portable 1 View  Apr 18, 2015   CLINICAL DATA:  Recent discharge from hospital for shingles  an urinary tract infection. Altered mental status.  EXAM: PORTABLE CHEST - 1 VIEW  COMPARISON:  03/25/2015 and 01/10/2015  FINDINGS: Patient is rotated to the left with  chin/head overlying the left apex. Lungs are adequately inflated without consolidation or effusion. Small nodular opacity over the right midlung and left midlung unchanged. Mild stable cardiomegaly. Mild calcified plaque over the aortic arch. Remainder of the exam is unchanged.  IMPRESSION: No acute cardiopulmonary disease.  Stable small bilateral nodules unchanged as agree with previous recommendation of chest CT.  Mild cardiomegaly.   Electronically Signed   By: Marin Olp M.D.   On: 03/31/2015 19:30   Dg Chest Port 1 View  03/25/2015   CLINICAL DATA:  Syncopal episode  EXAM: PORTABLE CHEST - 1 VIEW  COMPARISON:  01/10/2015  FINDINGS: Examination is somewhat limited secondary to the patient's inability to adequately position herself. The cardiac shadow is mildly enlarged but stable. The lungs are well aerated bilaterally. The previously seen nodular densities are not as well appreciated on today's exam. No focal confluent infiltrate is seen.  IMPRESSION: Limited exam without acute abnormality.   Electronically Signed   By: Inez Catalina M.D.   On: 03/25/2015 15:14     Medical Consultants:    None.  Anti-Infectives:    Vanc 5/14 --> 5/17  maxipime 5/14 --> 5/17  Cipro 5/17 --> 5/27  Acyclovir 6/16 --> 5/21  Subjective:   Ricky [name] is lethargic this a.m.  Minimally verbal.  Denies pain, dyspnea.  Eating some with assistance, but needs to be encouraged.  Objective:    Filed Vitals:   04/06/15 1343 04/06/15 2055 04/07/15 0212 04/07/15 0527  BP: 131/97 145/77 136/74 131/73  Pulse: 61 78 55 59  Temp: 97.4 F (36.3 C) 97.3 F (36.3 C) 98 F (36.7 C) 97.5 F (36.4 C)  TempSrc: Oral Axillary Axillary Axillary  Resp: 18 20 18 19   Height:      Weight:      SpO2: 100% 99% 98% 97%    Intake/Output Summary (Last 24 hours) at 04/07/15 0858 Last data filed at 04/07/15 0655  Gross per 24 hour  Intake 3341.67 ml  Output   2350 ml  Net 991.67 ml    Exam: Gen:  NAD,  lethargic but opens eyes to voice and provides one word answers Cardiovascular:  RRR, III/VI SEM Respiratory:  Lungs diminished Gastrointestinal:  Abdomen soft, NT/ND, + BS Extremities:  No C/E/C, upper extremity contractures, 1+ edema BLE   Data Reviewed:    Labs: Basic Metabolic Panel:  Recent Labs Lab 04/01/15 0140 04/02/15 0449 04/03/15 0812 04/04/15 0435 04/06/15 0400  NA 137 136 132* 135 138  K 3.7 3.1* 3.9 3.6 3.7  CL 103 102 101 102 107  CO2 25 25 25 24 25   GLUCOSE 98 80 93 95 101*  BUN 13 8 9 11 18   CREATININE 0.71 0.58* 0.49* 0.57* 1.42*  CALCIUM 8.8* 8.7* 8.1* 8.6* 8.8*   GFR Estimated Creatinine Clearance: 24.9 mL/min (by C-G formula based on Cr of 1.42). Liver Function Tests:  Recent Labs Lab 03/31/15 2020  AST 31  ALT 19  ALKPHOS 65  BILITOT 0.6  PROT 7.5  ALBUMIN 3.5   Coagulation profile  Recent Labs Lab 04/01/15 0335 04/01/15 0602  INR SPECIMEN CLOTTED 1.26    CBC:  Recent Labs Lab 03/31/15 2020 04/01/15 0140 04/02/15 0449 04/03/15 0812 04/04/15 0435  WBC 6.7 6.7 5.2 4.7 4.5  NEUTROABS 5.4  --   --   --   --  HGB 12.3* 11.1* 10.9* 12.1* 11.0*  HCT 38.1* 34.7* 32.9* 37.0* 34.2*  MCV 89.9 89.2 89.4 87.9 88.6  PLT 212 197 199 220 207   Cardiac Enzymes:  Recent Labs Lab 03/31/15 2020  TROPONINI 0.05*   Sepsis Labs:  Recent Labs Lab 03/31/15 2023 03/31/15 2259 04/01/15 0140 04/01/15 0335 04/02/15 0449 04/03/15 0812 04/04/15 0435  PROCALCITON  --   --  <0.10  --   --   --   --   WBC  --   --  6.7  --  5.2 4.7 4.5  LATICACIDVEN 3.25* 2.02* 1.9 1.3  --   --   --    Microbiology Recent Results (from the past 240 hour(s))  Culture, blood (routine x 2)     Status: None   Collection Time: 03/31/15  8:23 PM  Result Value Ref Range Status   Specimen Description BLOOD BLOOD RIGHT FOREARM UPPER  Final   Special Requests BOTTLES DRAWN AEROBIC AND ANAEROBIC 5CC  Final   Culture   Final    NO GROWTH 5 DAYS Performed at  [facility]    Report Status 04/07/2015 FINAL  Final  Culture, blood (routine x 2)     Status: None   Collection Time: 03/31/15  8:23 PM  Result Value Ref Range Status   Specimen Description BLOOD RIGHT HAND  Final   Special Requests BOTTLES DRAWN AEROBIC AND ANAEROBIC 5CC  Final   Culture   Final    MICROCOCCUS SPECIES Note: Standardized susceptibility testing for this organism is not available. Note: CRITICAL RESULT CALLED TO, READ BACK BY AND VERIFIED WITH: KATIE D 04/05/15 BY INGRAM A 1031AM Performed at [facility]    Report Status 04/05/2015 FINAL  Final  Urine culture     Status: None   Collection Time: 03/31/15  9:10 PM  Result Value Ref Range Status   Specimen Description URINE, CATHETERIZED  Final   Special Requests NONE  Final   Colony Count NO GROWTH Performed at [facility]   Final   Culture NO GROWTH Performed at [facility]   Final   Report Status 04/01/2015 FINAL  Final     Medications:   . ciprofloxacin  400 mg Intravenous Q24H  . enoxaparin (LOVENOX) injection  30 mg Subcutaneous Q24H  . feeding supplement (ENSURE ENLIVE)  237 mL Oral BID BM  . levothyroxine  12.5 mcg Intravenous Daily  . lidocaine  1 patch Transdermal Q24H   Continuous Infusions: . sodium chloride 100 mL/hr at 04/07/15 0712  . 0.9 % NaCl with KCl 40 mEq / L 50 mL/hr (04/05/15 1147)    Time spent: 25 minutes.   LOS: 7 days   [facility] Pager [phone]. If unable to reach me by pager, please call my cell phone at [phone].  *Please refer to [email or web address], password [email or web address] to get updated schedule on who will round on this patient, as hospitalists switch teams weekly. If 7PM-7AM, please contact night-coverage at [email or web address], password [email or web address] for any overnight needs.  04/07/2015, 8:58 AM

## 2015-04-07 NOTE — Progress Notes (Signed)
Family members fed patient this evening, even using straws in the drinks.                                                                                                                                                                                                                                                                                                                    The family were using straws when giving the patient fluids.  The RN gently reminded the family that the Aspiration Precautions required 'No Straws".  Will continue to monitor the patient.

## 2015-04-07 NOTE — Plan of Care (Signed)
Problem: Phase II Progression Outcomes Goal: Obtain order to discontinue catheter if appropriate Outcome: Not Applicable Date Met:  00/71/21 Chronic Foley Catheter

## 2015-04-08 LAB — BASIC METABOLIC PANEL
Anion gap: 8 (ref 5–15)
BUN: 15 mg/dL (ref 6–20)
CHLORIDE: 106 mmol/L (ref 101–111)
CO2: 25 mmol/L (ref 22–32)
Calcium: 8.4 mg/dL — ABNORMAL LOW (ref 8.9–10.3)
Creatinine, Ser: 0.73 mg/dL (ref 0.61–1.24)
GFR calc Af Amer: >60 mL/min (ref >60)
GLUCOSE: 83 mg/dL (ref 65–99)
POTASSIUM: 3.2 mmol/L — AB (ref 3.5–5.1)
Sodium: 139 mmol/L (ref 135–145)

## 2015-04-08 MED ORDER — POTASSIUM CHLORIDE IN NACL 40-0.9 MEQ/L-% IV SOLN
INTRAVENOUS | Status: DC
  Administered 2015-04-08 – 2015-04-13 (×9): 100 mL/h via INTRAVENOUS
  Filled 2015-04-08 (×15): qty 1000

## 2015-04-08 MED ORDER — POTASSIUM CHLORIDE 10 MEQ/100ML IV SOLN
10.0000 meq | INTRAVENOUS | Status: AC
  Administered 2015-04-08 (×3): 10 meq via INTRAVENOUS
  Filled 2015-04-08 (×2): qty 100

## 2015-04-08 NOTE — Progress Notes (Signed)
Progress Note   Ricky [name]:[id number] DOB: [birth year] DOA: 2015/04/03 PCP: Hollace Kinnier, DO   Brief Narrative:   Ricky [name] is an 81 y.o. male with a PMH of progressive supranuclear palsy, hypertension, and hypothyroidism, recent hospitalization 03/25/15-03/30/15 for treatment of UTI and shingles who was readmitted Apr 03, 2015 secondary to increased lethargy/decreased LOC. He had been discharged home on Cipro for treatment of pseudomonal UTI.  Assessment/Plan:   Principal Problem:   Acute Toxic encephalopathy, infectious versus progressive supranuclear palsy / decreased level of consciousness / failure to thrive in adult - Previous urine culture positive for Pseudomonas. Antibiotics initially broad-spectrum, narrowed to Cipro 04/03/15. - Would benefit from palliative care involvement. According to PCP, this has been discussed and family has resisted. - Catheter changed Apr 03, 2015. - Family declines feeding tube.   Active Problems:   Functional quadriplegia secondary to supranuclear palsy - PT/OT ordered. Has been too lethargic to participate. - Continues to require 2 person assist.    Hypothyroidism - Continue Synthroid.    Hypertension - Continue hydralazine as needed.    Dysphagia / severe protein calorie malnutrition - Seen by speech therapy with recommendations for dysphasia 3 diet with thin liquids. - Continue nutritional supplements. - Family declines feeding tube.    AKI secondary to dehydration/CKD (chronic kidney disease), stage II - Renal function noted to be slightly worse 04/06/15, IV fluids resumed. - Renal function back to baseline 04/08/15 with resumption of IV fluids.    Anemia, chronic disease - Hemoglobin stable with no current indication for transfusion.    UTI (lower urinary tract infection) / possible sepsis - Recent cultures positive for Pseudomonas. Plan for 2 week course of therapy, to complete 04/13/15. - Repeat urine culture 04-03-15  negative. - Continue Cipro.  Have changed back to IV route since not consistently taking POs and too lethargic at times to take.    Shingles - Completed his acyclovir course.    DVT Prophylaxis - Continue subcutaneous Lovenox.  Code Status: Full. Family Communication: [name] (son) and [name] (daughter) updated by telephone.   Disposition Plan: Home with son when stable, likely 1-2 days.    IV Access:    Peripheral IV   Procedures and diagnostic studies:   .Ct Head Wo Contrast  04/03/15   CLINICAL DATA:  Mental status change.  Not as responsive.  EXAM: CT HEAD WITHOUT CONTRAST  TECHNIQUE: Contiguous axial images were obtained from the base of the skull through the vertex without intravenous contrast.  COMPARISON:  Head CT 04/12/2014  FINDINGS: Patient is extremely kyphotic therefore non standard orientation was scanned. The generates bone artifact.  No acute intracranial hemorrhage. No focal mass lesion. No CT evidence of acute infarction. No midline shift or mass effect. No hydrocephalus. Basilar cisterns are patent.  Generalized cortical atrophy and ventricular dilatation.  Paranasal sinuses and mastoid air cells are clear. Chronic opacification of the sphenoid sinus.  IMPRESSION: 1. No acute intracranial findings. Nonstandard positioning does limit evaluation. 2. Chronic atrophy and microvascular disease. 3. Chronic opacification of the sphenoid sinuses air cells   Electronically Signed   By: Suzy Bouchard M.D.   On: 04-03-2015 20:35   Dg Chest Portable 1 View  Apr 03, 2015   CLINICAL DATA:  Recent discharge from hospital for shingles an urinary tract infection. Altered mental status.  EXAM: PORTABLE CHEST - 1 VIEW  COMPARISON:  03/25/2015 and 01/10/2015  FINDINGS: Patient is rotated to the left with chin/head overlying the left apex. Lungs are adequately  inflated without consolidation or effusion. Small nodular opacity over the right midlung and left midlung unchanged. Mild stable  cardiomegaly. Mild calcified plaque over the aortic arch. Remainder of the exam is unchanged.  IMPRESSION: No acute cardiopulmonary disease.  Stable small bilateral nodules unchanged as agree with previous recommendation of chest CT.  Mild cardiomegaly.   Electronically Signed   By: Marin Olp M.D.   On: 03/31/2015 19:30   Dg Chest Port 1 View  03/25/2015   CLINICAL DATA:  Syncopal episode  EXAM: PORTABLE CHEST - 1 VIEW  COMPARISON:  01/10/2015  FINDINGS: Examination is somewhat limited secondary to the patient's inability to adequately position herself. The cardiac shadow is mildly enlarged but stable. The lungs are well aerated bilaterally. The previously seen nodular densities are not as well appreciated on today's exam. No focal confluent infiltrate is seen.  IMPRESSION: Limited exam without acute abnormality.   Electronically Signed   By: Inez Catalina M.D.   On: 03/25/2015 15:14     Medical Consultants:    None.  Anti-Infectives:    Vanc 5/14 --> 5/17  maxipime 5/14 --> 5/17  Cipro 5/17 --> 5/27  Acyclovir 6/16 --> 5/21  Subjective:   Ricky [name] is lethargic this a.m.  Minimally verbal.  Denies pain, dyspnea.  Eating some with assistance, but needs to be encouraged.  Objective:    Filed Vitals:   04/07/15 0527 04/07/15 1355 04/07/15 2048 04/08/15 0456  BP: 131/73 143/75 152/84 136/73  Pulse: 59 56 59 59  Temp: 97.5 F (36.4 C) 97.8 F (36.6 C) 98.5 F (36.9 C) 98.5 F (36.9 C)  TempSrc: Axillary Oral Axillary Axillary  Resp: 19 18 18 18   Height:      Weight:      SpO2: 97% 99% 97% 97%    Intake/Output Summary (Last 24 hours) at 04/08/15 0801 Last data filed at 04/08/15 0700  Gross per 24 hour  Intake 3218.33 ml  Output   1600 ml  Net 1618.33 ml    Exam: Gen:  NAD, lethargic but opens eyes to voice and provides one word answers Cardiovascular:  RRR, III/VI SEM Respiratory:  Lungs diminished Gastrointestinal:  Abdomen soft, NT/ND, +  BS Extremities:  No C/E/C, upper extremity contractures, 1+ edema BLE   Data Reviewed:    Labs: Basic Metabolic Panel:  Recent Labs Lab 04/02/15 0449 04/03/15 0812 04/04/15 0435 04/06/15 0400 04/08/15 0355  NA 136 132* 135 138 139  K 3.1* 3.9 3.6 3.7 3.2*  CL 102 101 102 107 106  CO2 25 25 24 25 25   GLUCOSE 80 93 95 101* 83  BUN 8 9 11 18 15   CREATININE 0.58* 0.49* 0.57* 1.42* 0.73  CALCIUM 8.7* 8.1* 8.6* 8.8* 8.4*   GFR Estimated Creatinine Clearance: 44.2 mL/min (by C-G formula based on Cr of 0.73).  CBC:  Recent Labs Lab 04/02/15 0449 04/03/15 0812 04/04/15 0435  WBC 5.2 4.7 4.5  HGB 10.9* 12.1* 11.0*  HCT 32.9* 37.0* 34.2*  MCV 89.4 87.9 88.6  PLT 199 220 207   Microbiology Recent Results (from the past 240 hour(s))  Culture, blood (routine x 2)     Status: None   Collection Time: 03/31/15  8:23 PM  Result Value Ref Range Status   Specimen Description BLOOD BLOOD RIGHT FOREARM UPPER  Final   Special Requests BOTTLES DRAWN AEROBIC AND ANAEROBIC 5CC  Final   Culture   Final    NO GROWTH 5 DAYS Performed at [facility]    Report Status 04/07/2015 FINAL  Final  Culture, blood (routine x 2)     Status: None   Collection Time: 03/31/15  8:23 PM  Result Value Ref Range Status   Specimen Description BLOOD RIGHT HAND  Final   Special Requests BOTTLES DRAWN AEROBIC AND ANAEROBIC 5CC  Final   Culture   Final    MICROCOCCUS SPECIES Note: Standardized susceptibility testing for this organism is not available. Note: CRITICAL RESULT CALLED TO, READ BACK BY AND VERIFIED WITH: KATIE D 04/05/15 BY INGRAM A 1031AM Performed at [facility]    Report Status 04/05/2015 FINAL  Final  Urine culture     Status: None   Collection Time: 03/31/15  9:10 PM  Result Value Ref Range Status   Specimen Description URINE, CATHETERIZED  Final   Special Requests NONE  Final   Colony Count NO GROWTH Performed at [facility]   Final   Culture NO  GROWTH Performed at [facility]   Final   Report Status 04/01/2015 FINAL  Final     Medications:   . ciprofloxacin  400 mg Intravenous Q24H  . enoxaparin (LOVENOX) injection  30 mg Subcutaneous Q24H  . feeding supplement (ENSURE ENLIVE)  237 mL Oral TID BM  . levothyroxine  12.5 mcg Intravenous Daily  . lidocaine  1 patch Transdermal Q24H  . potassium chloride  10 mEq Intravenous Q1 Hr x 3   Continuous Infusions: . 0.9 % NaCl with KCl 40 mEq / L      Time spent: 25 minutes.   LOS: 8 days   [facility] Pager [phone]. If unable to reach me by pager, please call my cell phone at [phone].  *Please refer to [email or web address], password [email or web address] to get updated schedule on who will round on this patient, as hospitalists switch teams weekly. If 7PM-7AM, please contact night-coverage at [email or web address], password [email or web address] for any overnight needs.  04/08/2015, 8:01 AM

## 2015-04-09 LAB — BASIC METABOLIC PANEL
ANION GAP: 8 (ref 5–15)
BUN: 12 mg/dL (ref 6–20)
CALCIUM: 8.5 mg/dL — AB (ref 8.9–10.3)
CHLORIDE: 107 mmol/L (ref 101–111)
CO2: 24 mmol/L (ref 22–32)
Creatinine, Ser: 0.66 mg/dL (ref 0.61–1.24)
GFR calc Af Amer: >60 mL/min (ref >60)
GFR calc non Af Amer: >60 mL/min (ref >60)
Glucose, Bld: 82 mg/dL (ref 65–99)
Potassium: 4.1 mmol/L (ref 3.5–5.1)
Sodium: 139 mmol/L (ref 135–145)

## 2015-04-09 MED ORDER — CIPROFLOXACIN IN D5W 400 MG/200ML IV SOLN
400.0000 mg | Freq: Two times a day (BID) | INTRAVENOUS | Status: DC
  Administered 2015-04-09 – 2015-04-13 (×8): 400 mg via INTRAVENOUS
  Filled 2015-04-09 (×10): qty 200

## 2015-04-09 MED ORDER — ENOXAPARIN SODIUM 40 MG/0.4ML ~~LOC~~ SOLN
40.0000 mg | SUBCUTANEOUS | Status: DC
  Administered 2015-04-09 – 2015-04-13 (×5): 40 mg via SUBCUTANEOUS
  Filled 2015-04-09 (×5): qty 0.4

## 2015-04-09 NOTE — Progress Notes (Addendum)
Progress Note   Ricky [name] MGQ:[id number] DOB: 1917-11-06 DOA: April 26, 2015 PCP: Hollace Kinnier, DO   Brief Narrative:   Ricky [name] is an 81 y.o. male with a PMH of progressive supranuclear palsy, hypertension, and hypothyroidism, recent hospitalization 03/25/15-03/30/15 for treatment of UTI and shingles who was readmitted 04/26/15 secondary to increased lethargy/decreased LOC. He had been discharged home on Cipro for treatment of pseudomonal UTI.  Re-admitted with recurrent encephalopathy with FTT. He lives at home with his son, [name] and his daughter [name] who provide all of his custodial care.  Barrier to D/C is need for IV Cipro, IVF in the setting of poor oral intake (becomes dehydrated when IVF stopped).  Family refused PEG placement and is hopeful that he will improve to baseline at discharge.  He will complete his course of Cipro on 04/13/15. Barrier to discharge is that the patient requires 2 caregivers and one of his caregivers will be out of town for the next few days. His family also believes that he does not do as well when his antibodies are switched to the oral route.  Assessment/Plan:   Principal Problem:   Acute Toxic encephalopathy, infectious versus progressive supranuclear palsy / decreased level of consciousness / failure to thrive in adult - Previous urine culture positive for Pseudomonas. Antibiotics initially broad-spectrum, narrowed to Cipro 04/03/15 with plans to complete course on 04/13/15. - Would benefit from palliative care involvement. According to PCP, this has been discussed and family has resisted. - Catheter changed April 26, 2015. Have requested the RN change again given his frequent UTIs to ensure there is no nidus of infection on the tubing. - Family declines feeding tube. They will take him home when he is medically stable, but at very high risk of re-hospitalization, and has had 3 hospital admissions in the past 6 months..  Active Problems:   Functional  quadriplegia secondary to supranuclear palsy - Still very weak and unable to participate in therapy.    Hypothyroidism - Continue Synthroid.    Hypertension - Continue hydralazine as needed.    Dysphagia / severe protein calorie malnutrition - Seen by speech therapy with recommendations for dysphasia 3 diet with thin liquids. - Continue nutritional supplements. - Family declines feeding tube.    AKI secondary to dehydration/CKD (chronic kidney disease), stage II - Renal function noted to be slightly worse 04/06/15, IV fluids resumed. - Renal function back to baseline 04/08/15 with resumption of IV fluids.    Anemia, chronic disease - Hemoglobin stable with no current indication for transfusion.    UTI (lower urinary tract infection) / possible sepsis - Recent cultures positive for Pseudomonas. Plan for 2 week course of therapy, to complete 04/13/15. - Repeat urine culture Apr 26, 2015 negative. - Continue Cipro.  Have changed back to IV route since not consistently taking POs and too lethargic at times to take.    Shingles - Completed his acyclovir course.    DVT Prophylaxis - Continue subcutaneous Lovenox.  Code Status: Full. Family Communication: [name] (son) and [name] (daughter) updated at bedside.  Disposition Plan: Home with son when stable, likely not possible before 04/13/15 due to 1 of 2 caregivers being out of town.    IV Access:    Peripheral IV   Procedures and diagnostic studies:   .Ct Head Wo Contrast  April 26, 2015   CLINICAL DATA:  Mental status change.  Not as responsive.  EXAM: CT HEAD WITHOUT CONTRAST  TECHNIQUE: Contiguous axial images were obtained from the base of  the skull through the vertex without intravenous contrast.  COMPARISON:  Head CT 04/12/2014  FINDINGS: Patient is extremely kyphotic therefore non standard orientation was scanned. The generates bone artifact.  No acute intracranial hemorrhage. No focal mass lesion. No CT evidence of acute infarction.  No midline shift or mass effect. No hydrocephalus. Basilar cisterns are patent.  Generalized cortical atrophy and ventricular dilatation.  Paranasal sinuses and mastoid air cells are clear. Chronic opacification of the sphenoid sinus.  IMPRESSION: 1. No acute intracranial findings. Nonstandard positioning does limit evaluation. 2. Chronic atrophy and microvascular disease. 3. Chronic opacification of the sphenoid sinuses air cells   Electronically Signed   By: Suzy Bouchard M.D.   On: 03/31/2015 20:35   Dg Chest Portable 1 View  03/31/2015   CLINICAL DATA:  Recent discharge from hospital for shingles an urinary tract infection. Altered mental status.  EXAM: PORTABLE CHEST - 1 VIEW  COMPARISON:  03/25/2015 and 01/10/2015  FINDINGS: Patient is rotated to the left with chin/head overlying the left apex. Lungs are adequately inflated without consolidation or effusion. Small nodular opacity over the right midlung and left midlung unchanged. Mild stable cardiomegaly. Mild calcified plaque over the aortic arch. Remainder of the exam is unchanged.  IMPRESSION: No acute cardiopulmonary disease.  Stable small bilateral nodules unchanged as agree with previous recommendation of chest CT.  Mild cardiomegaly.   Electronically Signed   By: Marin Olp M.D.   On: 03/31/2015 19:30   Dg Chest Port 1 View  03/25/2015   CLINICAL DATA:  Syncopal episode  EXAM: PORTABLE CHEST - 1 VIEW  COMPARISON:  01/10/2015  FINDINGS: Examination is somewhat limited secondary to the patient's inability to adequately position herself. The cardiac shadow is mildly enlarged but stable. The lungs are well aerated bilaterally. The previously seen nodular densities are not as well appreciated on today's exam. No focal confluent infiltrate is seen.  IMPRESSION: Limited exam without acute abnormality.   Electronically Signed   By: Inez Catalina M.D.   On: 03/25/2015 15:14     Medical Consultants:    None.  Anti-Infectives:    Vanc 5/14  --> 5/17  maxipime 5/14 --> 5/17  Cipro 5/17 --> 5/27  Acyclovir 6/16 --> 5/21  Subjective:   Ricky [name] is very limited with regard to activity and alertness, family does not think he is back to his baseline, although he has improved, and does not feel comfortable taking him home (has 2 caregivers and 1 will be out of town for the next few days).Eating with encouragement.  Bowels moving.  Objective:    Filed Vitals:   04/08/15 0456 04/08/15 1318 04/08/15 2118 04/09/15 0608  BP: 136/73 135/73 157/61 137/98  Pulse: 59 53 58 71  Temp: 98.5 F (36.9 C) 97.5 F (36.4 C) 98.9 F (37.2 C) 98.4 F (36.9 C)  TempSrc: Axillary Oral Oral Oral  Resp: 18 16 16 16   Height:      Weight:      SpO2: 97% 98% 99% 97%    Intake/Output Summary (Last 24 hours) at 04/09/15 0715 Last data filed at 04/09/15 0600  Gross per 24 hour  Intake 3393.33 ml  Output   2900 ml  Net 493.33 ml    Exam: Gen:  NAD, eyes open,  Cardiovascular:  RRR, III/VI SEM Respiratory:  Lungs diminished Gastrointestinal:  Abdomen soft, NT/ND, + BS Extremities:  No C/E/C, upper extremity contractures, 1+ edema BLE   Data Reviewed:    Labs: Basic  Metabolic Panel:  Recent Labs Lab 04/03/15 0812 04/04/15 0435 04/06/15 0400 04/08/15 0355 04/09/15 0423  NA 132* 135 138 139 139  K 3.9 3.6 3.7 3.2* 4.1  CL 101 102 107 106 107  CO2 25 24 25 25 24   GLUCOSE 93 95 101* 83 82  BUN 9 11 18 15 12   CREATININE 0.49* 0.57* 1.42* 0.73 0.66  CALCIUM 8.1* 8.6* 8.8* 8.4* 8.5*   GFR Estimated Creatinine Clearance: 44.2 mL/min (by C-G formula based on Cr of 0.66).  CBC:  Recent Labs Lab 04/03/15 0812 04/04/15 0435  WBC 4.7 4.5  HGB 12.1* 11.0*  HCT 37.0* 34.2*  MCV 87.9 88.6  PLT 220 207   Microbiology Recent Results (from the past 240 hour(s))  Culture, blood (routine x 2)     Status: None   Collection Time: 03/31/15  8:23 PM  Result Value Ref Range Status   Specimen Description BLOOD BLOOD RIGHT  FOREARM UPPER  Final   Special Requests BOTTLES DRAWN AEROBIC AND ANAEROBIC 5CC  Final   Culture   Final    NO GROWTH 5 DAYS Performed at [facility]    Report Status 04/07/2015 FINAL  Final  Culture, blood (routine x 2)     Status: None   Collection Time: 03/31/15  8:23 PM  Result Value Ref Range Status   Specimen Description BLOOD RIGHT HAND  Final   Special Requests BOTTLES DRAWN AEROBIC AND ANAEROBIC 5CC  Final   Culture   Final    MICROCOCCUS SPECIES Note: Standardized susceptibility testing for this organism is not available. Note: CRITICAL RESULT CALLED TO, READ BACK BY AND VERIFIED WITH: KATIE D 04/05/15 BY INGRAM A 1031AM Performed at [facility]    Report Status 04/05/2015 FINAL  Final  Urine culture     Status: None   Collection Time: 03/31/15  9:10 PM  Result Value Ref Range Status   Specimen Description URINE, CATHETERIZED  Final   Special Requests NONE  Final   Colony Count NO GROWTH Performed at [facility]   Final   Culture NO GROWTH Performed at [facility]   Final   Report Status 04/01/2015 FINAL  Final     Medications:   . ciprofloxacin  400 mg Intravenous Q24H  . enoxaparin (LOVENOX) injection  40 mg Subcutaneous Q24H  . feeding supplement (ENSURE ENLIVE)  237 mL Oral TID BM  . levothyroxine  12.5 mcg Intravenous Daily  . lidocaine  1 patch Transdermal Q24H   Continuous Infusions: . 0.9 % NaCl with KCl 40 mEq / L 100 mL/hr (04/08/15 0916)    Time spent: 25 minutes.   LOS: 9 days   [facility] Pager [phone]. If unable to reach me by pager, please call my cell phone at [phone].  *Please refer to [email or web address], password [email or web address] to get updated schedule on who will round on this patient, as hospitalists switch teams weekly. If 7PM-7AM, please contact night-coverage at [email or web address], password [email or web address] for any overnight needs.  04/09/2015, 7:15 AM

## 2015-04-09 NOTE — Progress Notes (Signed)
Pharmacy - Renal antibiotic dosing (Cipro)  Assessment: 81 yo male admitted after failure of outpatient abx for pseudomonas UTI; on vanc/cefepime now narrowed to ciprofloxacin.  Dose reduced 5/20 with bump in renal function, but now SCr recovered.  CrCl 44.2 (up from 25) Afeb, WBC stable  Plan: Increase ciprofloxacin to 400 mg IV q12 hr Continue to monitor renal function, clinical course  Reuel Boom, PharmD Pager: [phone] 04/09/2015, 1:18 PM

## 2015-04-09 NOTE — Care Management Note (Signed)
Case Management Note  Patient Details  Name: PIERCEN COVINO MRN: [id number] Date of Birth: May 28, 1917  Subjective/Objective:  Ivf@100 , iv abx.[facility] following for [facility] has [place] orders.[facility] dme rep Pura Spice already has dme order for transport chair-will deliver to home once d/c order placed.Nsg updated.                  Action/Plan:d/c home w/HHC-[facility] already following.No further d/c needs or orders.   Expected Discharge Date:                  Expected Discharge Plan:  [place] (.[facility] care-HHRN/PT; )  In-House Referral:     Discharge planning Services  CM Consult  Post Acute Care Choice:  Home Health [facility] home care) Choice offered to:  Adult Children (Spoke to son [name] about d/c plans-wants home w/HHRN/PT, & wants transport chair.)  DME Arranged:  Other see comment Optometrist to be delivered  to Home by [place]) DME Agency:  [place]. ([place] DME Rep aware of order)  HH Arranged:  RN, PT HH Agency:  [place]  Status of Service:  In process, will continue to follow  Medicare Important Message Given:  Yes Date Medicare IM Given:  04/03/15 Medicare IM give by:  Dessa Phi Date Additional Medicare IM Given:  04/09/15 Additional Medicare Important Message give by:  Dessa Phi  If discussed at Long Length of Stay Meetings, dates discussed:    Additional Comments:  Dessa Phi, RN 04/09/2015, 2:05 PM

## 2015-04-09 NOTE — Progress Notes (Signed)
Pt blood pressure 167/87.  Made MD aware per protocol.  Other VSS.  Will continue to monitor.

## 2015-04-10 NOTE — Care Management Note (Signed)
Case Management Note  Patient Details  Name: LENDELL GALLICK MRN: [id number] Date of Birth: 1917-08-21  Subjective/Objective:   Ivf@100 , iv abx-to end 04/13/15 per MD.Son/Dtr has expressed they are aware of d/c Thursday or Friday home w/HHC.                 Action/Plan:d/c plans home 5/27 or 04/14/15 home w/HHC, dme to be delived to home by [facility] dme rep aware.   Expected Discharge Date:                  Expected Discharge Plan:  [place] (.[facility] care-HHRN/PT; )  In-House Referral:     Discharge planning Services  CM Consult  Post Acute Care Choice:  Home Health [facility] home care) Choice offered to:  Adult Children (Spoke to son [name], & Dtr-Lisa on phone about d/c plans-home w/HHC-[facility];also provied w/private duty care agency list9out of pocket cost.They voiced understanding.)  DME Arranged:  Other see comment Optometrist to be delivered  to Home by [place]) DME Agency:  [place]. ([place] DME Rep aware of order)  HH Arranged:  RN, PT HH Agency:  [place]  Status of Service:  In process, will continue to follow  Medicare Important Message Given:  Yes Date Medicare IM Given:  04/03/15 Medicare IM give by:  Dessa Phi Date Additional Medicare IM Given:  04/09/15 Additional Medicare Important Message give by:  Dessa Phi  If discussed at Long Length of Stay Meetings, dates discussed:    Additional Comments:  Dessa Phi, RN 04/10/2015, 1:00 PM

## 2015-04-11 NOTE — Progress Notes (Signed)
Patient ID: Ricky [name], male   DOB: 06-19-1917, 81 y.o.   MRN: [id number]  [facility] PROGRESS NOTE  FARAH BENISH [id number] DOB: [birth year] DOA: 03/31/2015 PCP: Hollace Kinnier, DO   Brief narrative:    81 y.o. male with a PMH of progressive supranuclear palsy, hypertension, and hypothyroidism, recent hospitalization 03/25/15-03/30/15 for treatment of UTI and shingles who was readmitted 03/31/15 secondary to increased lethargy/decreased LOC. He had been discharged home on Cipro for treatment of pseudomonal UTI. Re-admitted with recurrent encephalopathy with FTT. He lives at home with his son, Ricky [name] and his daughter Ricky [name] who provide all of his custodial care. Barrier to D/C is need for IV Cipro, IVF in the setting of poor oral intake (becomes dehydrated when IVF stopped). Family refused PEG placement and is hopeful that he will improve to baseline at discharge. He will complete his course of Cipro on 04/13/15.   Assessment/Plan:    Principal Problem:  Acute Toxic encephalopathy, infectious versus progressive supranuclear palsy / decreased level of consciousness / failure to thrive in adult - Previous urine culture positive for Pseudomonas. Antibiotics initially broad-spectrum, narrowed to Cipro 04/03/15 with plans to complete course on 04/13/15. - Would benefit from palliative care involvement. According to PCP, this has been discussed and family has resisted. Family still refusing at this time  - Catheter changed 03/31/15.  - Family declines feeding tube  Active Problems:  Functional quadriplegia secondary to supranuclear palsy - unable to tolerate any PT, unable to get out of the bed to chair and requires two people assistance    Hypothyroidism - Continue Synthroid.   Hypertension - Continue hydralazine as needed.   Dysphagia / severe protein calorie malnutrition - SLP done and recommended dysphasia 3 diet with thin liquids. - Continue nutritional supplements. -  Family declines feeding tube.   AKI secondary to dehydration/CKD (chronic kidney disease), stage II - Renal function noted to be slightly worse 04/06/15, IV fluids resumed. - Renal function back to baseline 04/08/15 with resumption of IV fluids. - no need for repeat blood work at this time    Anemia, chronic disease - Hemoglobin stable with no current indication for transfusion.   UTI (lower urinary tract infection) / possible sepsis - Recent cultures positive for Pseudomonas. Plan for 2 week course of therapy, to complete 04/13/15. - Repeat urine culture 03/31/15 negative. - Continue Cipro.[place] course May 27th, 2016   Shingles - Completed his acyclovir course.   DVT Prophylaxis - Continue subcutaneous Lovenox.  Code Status: Full.  Family Communication:  plan of care discussed with son and daughter  Disposition Plan: Home with family May 27th, 2016   IV access:  Peripheral IV  Procedures and diagnostic studies:    Ct Head Wo Contrast 03/31/2015   No acute intracranial findings. Nonstandard positioning does limit evaluation. 2. Chronic atrophy and microvascular disease.   Dg Chest Portable 1 View  03/31/2015  No acute cardiopulmonary disease.  Stable small bilateral nodules unchanged as agree with previous recommendation of chest CT.  Mild cardiomegaly.   Dg Chest Port 1 View  03/25/2015   Limited exam without acute abnormality.    Medical Consultants:  None  Other Consultants:  PT/SLP/OT  IAnti-Infectives:   Cipro IV until May 27th, 2016   Faye Ramsay, MD  [facility] Pager [phone]  If 7PM-7AM, please contact night-coverage [email or web address] Password [facility] 04/11/2015, 3:36 PM   LOS: 11 days   HPI/Subjective: No events overnight. Pt non verbal and not following nay commands. Nurse said  pt able to eat some this AM.  Objective: Filed Vitals:   04/10/15 2126 04/11/15 0436 04/11/15 0600 04/11/15 1434  BP:  163/89 150/79 167/98  Pulse: 62 59  68  Temp: 98.3 F  (36.8 C) 98.6 F (37 C)  98 F (36.7 C)  TempSrc: Axillary Axillary  Oral  Resp:  18  18  Height:      Weight:      SpO2:  100%  100%    Intake/Output Summary (Last 24 hours) at 04/11/15 1536 Last data filed at 04/11/15 1435  Gross per 24 hour  Intake   2270 ml  Output   3525 ml  Net  -1255 ml    Exam:   General:  Pt is alert, non verbal, frail and contracted upper extremities   Cardiovascular: Regular rate and rhythm, no rubs, no gallops  Respiratory: Poor inspiratory effort, diminished breath sounds at bases   Abdomen: Soft, non tender, non distended, bowel sounds present, no guarding  Data Reviewed: Basic Metabolic Panel:  Recent Labs Lab 04/06/15 0400 04/08/15 0355 04/09/15 0423  NA 138 139 139  K 3.7 3.2* 4.1  CL 107 106 107  CO2 25 25 24   GLUCOSE 101* 83 82  BUN 18 15 12   CREATININE 1.42* 0.73 0.66  CALCIUM 8.8* 8.4* 8.5*   Liver Function Tests: No results for input(s): AST, ALT, ALKPHOS, BILITOT, PROT, ALBUMIN in the last 168 hours. No results for input(s): LIPASE, AMYLASE in the last 168 hours. No results for input(s): AMMONIA in the last 168 hours. CBC: No results for input(s): WBC, NEUTROABS, HGB, HCT, MCV, PLT in the last 168 hours. Cardiac Enzymes: No results for input(s): CKTOTAL, CKMB, CKMBINDEX, TROPONINI in the last 168 hours. BNP: Invalid input(s): POCBNP CBG: No results for input(s): GLUCAP in the last 168 hours.  No results found for this or any previous visit (from the past 240 hour(s)).   Scheduled Meds: . ciprofloxacin  400 mg Intravenous Q12H  . enoxaparin (LOVENOX) injection  40 mg Subcutaneous Q24H  . feeding supplement (ENSURE ENLIVE)  237 mL Oral TID BM  . levothyroxine  12.5 mcg Intravenous Daily  . lidocaine  1 patch Transdermal Q24H   Continuous Infusions: . 0.9 % NaCl with KCl 40 mEq / L 100 mL/hr (04/11/15 1530)

## 2015-04-12 DIAGNOSIS — L8990 Pressure ulcer of unspecified site, unspecified stage: Secondary | ICD-10-CM | POA: Insufficient documentation

## 2015-04-12 MED ORDER — ENSURE ENLIVE PO LIQD
237.0000 mL | Freq: Every morning | ORAL | Status: DC
  Administered 2015-04-13: 237 mL via ORAL

## 2015-04-12 NOTE — Plan of Care (Signed)
Problem: Phase III Progression Outcomes Goal: Voiding independently Outcome: Not Applicable Date Met:  04/12/15 Chronic foley         

## 2015-04-12 NOTE — Care Management Note (Signed)
Case Management Note  Patient Details  Name: Ricky [name] MRN: [id number] Date of Birth: 1917-08-23  Subjective/Objective:  MD plans to d/c in am home w/[facility] home care.Famiily declined palliative cons.Home care agency aware.                  Action/Plan:d/c home w/HHC-orders already placed,& dme orders already placed.No further d/c needs.   Expected Discharge Date:                  Expected Discharge Plan:  [place] (.[facility] care-HHRN/PT; )  In-House Referral:     Discharge planning Services  CM Consult  Post Acute Care Choice:  Home Health [facility] home care) Choice offered to:  Adult Children (Spoke to son [name], & Dtr-Lisa on phone about d/c plans-home w/HHC-[facility];also provied w/private duty care agency list(out of pocket cost).They voiced understanding.)  DME Arranged:  Other see comment Optometrist to be delivered  to Home by [place]) DME Agency:  [place]. ([place] DME Rep aware of order)  HH Arranged:  RN, PT HH Agency:  [place]  Status of Service:  In process, will continue to follow  Medicare Important Message Given:  Yes Date Medicare IM Given:  04/03/15 Medicare IM give by:  Dessa Phi Date Additional Medicare IM Given:  04/12/15 Additional Medicare Important Message give by:  Dessa Phi  If discussed at Long Length of Stay Meetings, dates discussed:  04/12/15  Additional Comments:  Dessa Phi, RN 04/12/2015, 12:37 PM

## 2015-04-12 NOTE — Progress Notes (Addendum)
Patient ID: Ricky [name], male   DOB: 09/19/1917, 81 y.o.   MRN: [id number]  [facility] PROGRESS NOTE  Ricky [name] YIF:[id number] DOB: 1917/07/06 DOA: 03/31/2015 PCP: Hollace Kinnier, DO   Brief narrative:    81 y.o. male with a PMH of progressive supranuclear palsy, hypertension, and hypothyroidism, recent hospitalization 03/25/15-03/30/15 for treatment of UTI and shingles who was readmitted 03/31/15 secondary to increased lethargy/decreased LOC. He had been discharged home on Cipro for treatment of pseudomonal UTI. Re-admitted with recurrent encephalopathy with FTT. He lives at home with his son, [name] and his daughter [name] who provide all of his custodial care. Barrier to D/C is need for IV Cipro, IVF in the setting of poor oral intake (becomes dehydrated when IVF stopped). Family refused PEG placement and is hopeful that he will improve to baseline at discharge. He will complete his course of Cipro on 04/13/15.   Assessment/Plan:    Principal Problem:  Acute Toxic encephalopathy, infectious versus progressive supranuclear palsy / decreased level of consciousness / failure to thrive in adult - Previous urine culture positive for Pseudomonas. Antibiotics initially broad-spectrum, narrowed to Cipro 04/03/15 with plans to complete course on 04/13/15. - Would benefit from palliative care involvement. According to PCP, this has been discussed and family has resisted. Family still refusing at this time  - Catheter changed 03/31/15.  - Family declines feeding tube  Active Problems:  Functional quadriplegia secondary to supranuclear palsy - unable to tolerate any PT, unable to get out of the bed to chair and requires two people assistance  - attempt to get into the chair this afternoon if pt able to tolerate    Hypothyroidism - Continue Synthroid.   Hypertension - Continue hydralazine as needed.   Dysphagia / severe protein calorie malnutrition - SLP done and recommended  dysphasia 3 diet with thin liquids. - Continue nutritional supplements. - per nursing staff, pt did tolerate breakfast this AM  - Family declines feeding tube.   AKI secondary to dehydration/CKD (chronic kidney disease), stage II - Renal function noted to be slightly worse 04/06/15, IV fluids resumed. - Renal function back to baseline 04/08/15 with resumption of IV fluids. - no need for repeat blood work at this time    Anemia, chronic disease - Hemoglobin stable with no current indication for transfusion. - no signs of active bleeding    UTI (lower urinary tract infection) / possible sepsis - Recent cultures positive for Pseudomonas. Plan for 2 week course of therapy, to complete 04/13/15. - Repeat urine culture 03/31/15 negative. - Continue Cipro.[place] course May 27th, 2016   Shingles - Completed his acyclovir course.   DVT Prophylaxis - Continue subcutaneous Lovenox.  Code Status: Full.  Family Communication:  plan of care discussed with son and daughter  Disposition Plan: Home with family May 27th, 2016   IV access:  Peripheral IV  Procedures and diagnostic studies:    Ct Head Wo Contrast 03/31/2015   No acute intracranial findings. Nonstandard positioning does limit evaluation. 2. Chronic atrophy and microvascular disease.   Dg Chest Portable 1 View  03/31/2015  No acute cardiopulmonary disease.  Stable small bilateral nodules unchanged as agree with previous recommendation of chest CT.  Mild cardiomegaly.   Dg Chest Port 1 View  03/25/2015   Limited exam without acute abnormality.    Medical Consultants:  None  Other Consultants:  PT/SLP/OT  IAnti-Infectives:   Cipro IV until May 27th, 2016   Faye Ramsay, MD  [facility] Pager [phone]  If 7PM-7AM, please contact night-coverage [email or web address] Password [email or web address] 04/12/2015, 7:02 AM   LOS: 12 days   HPI/Subjective: No events overnight. Pt non verbal and not following nay commands. Nurse said pt able to eat  some this AM.  Objective: Filed Vitals:   04/11/15 1958 04/11/15 2145 04/12/15 0315 04/12/15 0500  BP:  118/89 151/56 151/69  Pulse: 64 80 53 53  Temp:  98 F (36.7 C) 97.6 F (36.4 C) 97.8 F (36.6 C)  TempSrc:  Axillary Axillary Axillary  Resp:  18 16 16   Height:      Weight:      SpO2:  100% 100% 100%    Intake/Output Summary (Last 24 hours) at 04/12/15 6283 Last data filed at 04/12/15 0523  Gross per 24 hour  Intake   3633 ml  Output   3525 ml  Net    108 ml    Exam:   General:  Pt is alert, non verbal, frail and contracted upper extremities   Cardiovascular: Regular rate and rhythm, no rubs, no gallops  Respiratory: Poor inspiratory effort, diminished breath sounds at bases   Abdomen: Soft, non tender, non distended, bowel sounds present, no guarding  Data Reviewed: Basic Metabolic Panel:  Recent Labs Lab 04/06/15 0400 04/08/15 0355 04/09/15 0423  NA 138 139 139  K 3.7 3.2* 4.1  CL 107 106 107  CO2 25 25 24   GLUCOSE 101* 83 82  BUN 18 15 12   CREATININE 1.42* 0.73 0.66  CALCIUM 8.8* 8.4* 8.5*    Scheduled Meds: . ciprofloxacin  400 mg Intravenous Q12H  . enoxaparin (LOVENOX) injection  40 mg Subcutaneous Q24H  . feeding supplement (ENSURE ENLIVE)  237 mL Oral TID BM  . levothyroxine  12.5 mcg Intravenous Daily  . lidocaine  1 patch Transdermal Q24H   Continuous Infusions: . 0.9 % NaCl with KCl 40 mEq / L 100 mL/hr (04/12/15 0234)

## 2015-04-12 NOTE — Progress Notes (Signed)
Pharmacy - Renal antibiotic dosing (Cipro)  Assessment: 81 yo male admitted after failure of outpatient abx for pseudomonas UTI; on vanc/cefepime now narrowed to ciprofloxacin.  Dose reduced 5/20 with bump in renal function, but now SCr recovered.  CrCl ~ 44 ml/min, SCr 0.66 from labs 5/23 Afeb, WBC stable  Day 13 abx, Cefepime 5/14-17 Day 8 Cipro 400mg  IV q12, to complete abx 5/27  Plan: Continue ciprofloxacin to 400 mg IV q12 hr Continue to monitor renal function, clinical course  Minda Ditto PharmD Pager [phone] 04/12/2015, 1:25 PM

## 2015-04-12 NOTE — Care Management Note (Signed)
Case Management Note  Patient Details  Name: Ricky [name] MRN: [id number] Date of Birth: [birth year]  Subjective/Objective:                    Action/Plan:   Expected Discharge Date:                  Expected Discharge Plan:  [place] (.[facility] care-HHRN/PT; )  In-House Referral:     Discharge planning Services  CM Consult  Post Acute Care Choice:  Home Health [facility] home care) Choice offered to:  Adult Children (Spoke to son Ricky [name], & Dtr-Ricky [name] on phone about d/c plans-home w/[facility];also provied w/private duty care agency list(out of pocket cost).They voiced understanding.)  DME Arranged:  Other see comment Optometrist to be delivered  to Home by [place]) DME Agency:  [place]. ([place] DME Rep aware of order)  HH Arranged:  RN, PT HH Agency:  [place]  Status of Service:  In process, will continue to follow  Medicare Important Message Given:  Yes Date Medicare IM Given:  04/03/15 Medicare IM give by:  Dessa Phi Date Additional Medicare IM Given:  04/12/15 Additional Medicare Important Message give by:  Dessa Phi  If discussed at Long Length of Stay Meetings, dates discussed:  04/12/15  Additional Comments:  Dessa Phi, RN 04/12/2015, 10:46 AM

## 2015-04-12 NOTE — Care Management Note (Signed)
Case Management Note  Patient Details  Name: Ricky [name] MRN: [id number] Date of Birth: May 18, 1917  Subjective/Objective:  Per MD- palliative care cons-declined by son.[place] care aware, & will  Follow as otpt on long term plan, & needs.                 Action/Plan:d/c home tomorrow if medically stable per MD.HHC already set up-[facility]-rep [place] aware & following.[facility] dme rep Le[place]ia-has transport chair orders,& aware of d/c tomorrow for home delivery.   Expected Discharge Date:                  Expected Discharge Plan:  [place] (.[facility] care-HHRN/PT; )  In-House Referral:     Discharge planning Services  CM Consult  Post Acute Care Choice:  Home Health [facility] home care) Choice offered to:  Adult Children (Spoke to son [name], & Dtr-Lisa on phone about d/c plans-home w/[facility];also provied w/private duty care agency list(out of pocket cost).They voiced understanding.)  DME Arranged:  Other see comment Optometrist to be delivered  to Home by [place]) DME Agency:  [place]. ([place] DME Rep aware of order)  HH Arranged:  RN, PT HH Agency:  [place]  Status of Service:  In process, will continue to follow  Medicare Important Message Given:  Yes Date Medicare IM Given:  04/03/15 Medicare IM give by:  Dessa Phi Date Additional Medicare IM Given:  04/12/15 Additional Medicare Important Message give by:  Dessa Phi  If discussed at Long Length of Stay Meetings, dates discussed:    Additional Comments:  Dessa Phi, RN 04/12/2015, 10:41 AM

## 2015-04-12 NOTE — Progress Notes (Signed)
Nutrition Follow-up  DOCUMENTATION CODES:  Severe malnutrition in context of chronic illness, Underweight  INTERVENTION: - Continue current diet order and Ensure Enlive, but will decrease it to once a day  NUTRITION DIAGNOSIS:  Malnutrition related to chronic illness as evidenced by severe depletion of muscle mass, moderate depletion of body fat. -ongoing  GOAL:  Patient will meet greater than or equal to 90% of their needs -unmet  MONITOR:  PO intake, Supplement acceptance, Weight trends, Labs, I & O's  ASSESSMENT: Pt ate 50% of breakfast and 50% of lunch yesterday. RN reports that pt has not been drinking Ensure Enlive; will decrease order to once a day. Per rounds this AM, pt to d/c home tomorrow with home health. Not meeting needs on average. Labs and medications reviewed.  Height:  Ht Readings from Last 1 Encounters:  04/03/15 6\' 2"  (1.88 m)    Weight:  Wt Readings from Last 1 Encounters:  04/03/15 124 lb 12.5 oz (56.6 kg)    Ideal Body Weight:  86.4 kg (kg)  Wt Readings from Last 10 Encounters:  04/03/15 124 lb 12.5 oz (56.6 kg)  03/25/15 115 lb (52.164 kg)  01/11/15 115 lb (52.164 kg)  11/22/14 115 lb (52.164 kg)  06/28/14 160 lb 3.3 oz (72.67 kg)  04/20/14 150 lb (68.04 kg)  03/31/14 141 lb 8 oz (64.184 kg)  03/29/14 158 lb (71.668 kg)  03/28/14 153 lb 11.2 oz (69.718 kg)  03/13/14 145 lb 15.1 oz (66.2 kg)    BMI:  Body mass index is 16.01 kg/(m^2).  Estimated Nutritional Needs:  Kcal:  1200-1400  Protein:  60-80 grams  Fluid:  2L/day  Skin:  Wound (see comment); stage 1 pressure ulcer to sacrum  Diet Order:  DIET DYS 3 Room service appropriate?: No; Fluid consistency:: Thin  EDUCATION NEEDS:  No education needs identified at this time   Intake/Output Summary (Last 24 hours) at 04/12/15 1159 Last data filed at 04/12/15 0846  Gross per 24 hour  Intake   3433 ml  Output   2725 ml  Net    708 ml    Last BM:  5/24   Jarome Matin,  RD, LDN Inpatient Clinical Dietitian Pager # [phone] After hours/weekend pager # [phone]

## 2015-04-13 DIAGNOSIS — Z7189 Other specified counseling: Secondary | ICD-10-CM

## 2015-04-13 DIAGNOSIS — R1314 Dysphagia, pharyngoesophageal phase: Secondary | ICD-10-CM

## 2015-04-13 DIAGNOSIS — Z515 Encounter for palliative care: Secondary | ICD-10-CM

## 2015-04-13 DIAGNOSIS — R627 Adult failure to thrive: Secondary | ICD-10-CM

## 2015-04-13 NOTE — Discharge Instructions (Signed)

## 2015-04-13 NOTE — Care Management Note (Signed)
Case Management Note  Patient Details  Name: Ricky [name] MRN: [id number] Date of Birth: [birth year]  Subjective/Objective:                    Action/Plan:d/c ome w/[facility]HC[facility] aware, [facility] for dme-transport chair.No further d/c needs or orders.   Expected Discharge Date:                  Expected Discharge Plan:  [employer] (.[facility]-HHRN/PT; )  In-House Referral:     Discharge planning Services  CM Consult  Post Acute Care Choice:  Home Health [facility] home care) Choice offered to:  Adult Children (Spoke to son [name], & Dtr-Lisa on phone about d/c plans-home w/[facility];also provied w/private duty care agency list(out of pocket cost).They voiced understanding.)  DME Arranged:  Other see comment Optometrist to be delivered  to Home by [place]) DME Agency:  [place]. ([place] DME Rep aware of order)  HH Arranged:  RN, PT HH Agency:  [place]  Status of Service:  Completed, signed off  Medicare Important Message Given:  Yes Date Medicare IM Given:  04/03/15 Medicare IM give by:  Dessa Phi Date Additional Medicare IM Given:  04/12/15 Additional Medicare Important Message give by:  Dessa Phi  If discussed at Long Length of Stay Meetings, dates discussed:    Additional Comments:  Dessa Phi, RN 04/13/2015, 11:54 AM

## 2015-04-13 NOTE — Consult Note (Signed)
Consultation Note Date: 04/13/2015   Patient Name: Ricky [name]  DOB: Jun 16, 1917  MRN: [id number]  Age / Sex: 81 y.o., male   PCP: Gayland Curry, DO Referring Physician: Theodis Blaze, MD  Reason for Consultation: Establishing goals of care and Psychosocial/spiritual support  Palliative Care Assessment and Plan Summary of Established Goals of Care and Medical Treatment Preferences    Palliative Care Discussion Held Today:  This NP Wadie Lessen reviewed medical records, received report from team, assessed the patient and then meet at the patient's bedside along with his son/[name] and daughter/Lisa  to discuss diagnosis, prognosis, GOC, EOL wishes disposition and options.  Conversation continued with [facility] nursing supervisor on conference call with anticipation home today  A detailed discussion was had today regarding advanced directives.  Concepts specific to code status, artifical feeding and hydration, continued IV antibiotics and rehospitalization was had.  The difference between a aggressive medical intervention path  and a palliative comfort care path for this patient at this time was had.  Values and goals of care important to patient and family were attempted to be elicited.  Concept of Hospice and Palliative Care and physician home visit program options were discussed  Natural trajectory and expectations at EOL were discussed.  Questions and concerns addressed.  Hard Choices booklet left for review. Family encouraged to call with questions or concerns.  PMT will continue to support holistically.  MOST form completed, copy in chart, original with family  -DNR/DNI -no artifical feeding now or in the future -family is open to artifical hydration and IV antibiotics and re-hospitalization     Contacts/Participants in Discussion: Primary Decision Maker: [name] Schiavi/daughter  HCPOA: yes    Code Status/Advance Care Planning:  DNR/DNI-documented  today   Psycho-social/Spiritual:   Support System: son and daughter--[facility] nurses for [facility] services   Prognosis: Unable to determine  Will depend on desire for life prolonging medical interventions   Discharge Planning:  Home with [place]       Chief Complaint/History of Present Illness: weakness, overall failure to thrive  81 y.o. male with a history of Progressive Supranuclear Palsy HTN, Hypothyroid who was inpatient from May 8 through May 13 for UTI and Shingles and was brought to the ED due to increased lethargy and decreased LOC over the past 24 hours. He was discharge home on Cipro for a Pseudomonas UTI. His Son gives the history and reports that he has also had poor intake of foods and liquids today Continued physical, functional and cognitive decline over the past 6 months with frequent re-hospitalization  Family continues to face concept of mortality, limitations of medical interventions and  advanced directive decisions and anticipatory care needs.  Primary Diagnoses  Present on Admission:  . Shingles . Sepsis . Acute encephalopathy . CKD (chronic kidney disease), stage II . FTT (failure to thrive) in adult . Anemia, chronic disease . Hypothyroidism . Supranuclear palsies, progressive . UTI (lower urinary tract infection) . Hypertension . Decreased level of consciousness . Dehydration . Toxic encephalopathy, infectious versus progressive supranuclear palsy . AKI (acute kidney injury)  Palliative Review of Systems:  Unable to illicit due to decreased cognition  I have reviewed the medical record, interviewed the patient and family, and examined the patient. The following aspects are pertinent.  Past Medical History  Diagnosis Date  . IBS (irritable bowel syndrome)   . Diverticulosis   . Cataract   . OAB (overactive bladder)   . Macular degeneration   .  Alzheimer disease   . Hypertension   . Arthritis   . Osteoporosis   . Parkinsonian syndrome   .  Syncope, vasovagal   . Neuromuscular disorder     parkisonian syndrome  . 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 Unspecified transient cerebral ischemia   . Unspecified urinary incontinence   . Senile dementia, uncomplicated   . Unspecified constipation   . Hypothyroidism   . Anemia, unspecified   . Depression   . Alzheimer's disease   . Gout, unspecified   . Thoracic or lumbosacral neuritis or radiculitis, unspecified   . Progressive supranuclear palsy   . Unspecified transient cerebral ischemia   . Unspecified urinary incontinence   . Syncope and collapse   . Paralysis agitans   . Unspecified transient cerebral ischemia   . Pneumonitis due to inhalation of food or vomitus   . Cellulitis and abscess of oral soft tissues   . Screening for lipoid disorders   . Senile dementia, uncomplicated   . Unspecified constipation   . Disorder of bone and cartilage, unspecified   . Other specified acquired hypothyroidism   . Unspecified hypothyroidism   . Anemia, unspecified   . Depressive disorder, not elsewhere classified   . Alzheimer's disease   . Paralysis agitans   . Pneumonia, organism unspecified   . Pseudomonas infection in conditions classified elsewhere and of unspecified site   . Unspecified essential hypertension   . Urinary tract infection, site not specified   . Osteoarthrosis, unspecified whether generalized or localized, unspecified site   . Osteoporosis, unspecified   . Gout, unspecified   . Thoracic or lumbosacral neuritis or radiculitis, unspecified   . Stroke    History   Social History  . Marital Status: Single    Spouse Name: N/A  . Number of Children: N/A  . Years of Education: N/A   Social History Main Topics  . Smoking status: Former Research scientist (life sciences)  . Smokeless tobacco: Never Used     Comment: QUIT SMOKING BACK IN THE LATE 50'S  . Alcohol Use: No  . Drug Use: No  . Sexual Activity: No   Other Topics Concern  . None    Social History Narrative   Family History  Problem Relation Age of Onset  . Cancer Mother   . Stroke Father   . Liver disease Father   . Kidney disease Brother   . Liver disease Brother   . Emphysema Brother    Scheduled Meds: . ciprofloxacin  400 mg Intravenous Q12H  . enoxaparin (LOVENOX) injection  40 mg Subcutaneous Q24H  . feeding supplement (ENSURE ENLIVE)  237 mL Oral q morning - 10a  . levothyroxine  12.5 mcg Intravenous Daily  . lidocaine  1 patch Transdermal Q24H   Continuous Infusions: . 0.9 % NaCl with KCl 40 mEq / L 100 mL/hr (04/13/15 1045)   PRN Meds:.acetaminophen **OR** acetaminophen, alum & mag hydroxide-simeth, HYDROmorphone (DILAUDID) injection, ondansetron **OR** ondansetron (ZOFRAN) IV, oxyCODONE Medications Prior to Admission:  Prior to Admission medications   Medication Sig Start Date End Date Taking? Authorizing Provider  acetaminophen (TYLENOL) 500 MG tablet Take 1,000 mg by mouth 3 (three) times daily.    Yes Historical Provider, MD  AMBULATORY NON FORMULARY MEDICATION Pressure Air Mattress Dx: G60.8, L89.150 10/30/14  Yes Tiffany L Reed, DO  aspirin EC 325 MG tablet Take 325 mg by mouth every evening.   Yes Historical Provider, MD  bacitracin ointment Apply 1 application topically 2 (  two) times daily. Applies to head of penis to decrease infection from catheter.   Yes Historical Provider, MD  Calcium Carbonate-Vit D-Min (CALTRATE 600+D PLUS) 600-800 MG-UNIT CHEW Chew 1 tablet by mouth 2 (two) times daily with a meal.    Yes Historical Provider, MD  ciprofloxacin (CIPRO) 500 MG tablet Take 1 tablet (500 mg total) by mouth 2 (two) times daily. 03/30/15  Yes Hosie Poisson, MD  CRANBERRY SOFT PO Take 2 capsules by mouth 3 (three) times daily.    Yes Historical Provider, MD  Cyanocobalamin (VITAMIN B-12) 5000 MCG SUBL Place 5,000 mcg under the tongue 2 (two) times daily.   Yes Historical Provider, MD  ferrous sulfate 325 (65 FE) MG tablet Take 325 mg by  mouth every other day.    Yes Historical Provider, MD  guaiFENesin (MUCINEX) 600 MG 12 hr tablet Take 600 mg by mouth 2 (two) times daily as needed for cough.   Yes Historical Provider, MD  hydrALAZINE (APRESOLINE) 25 MG tablet Take 1/2 tablet by mouth twice daily to control blood pressure 11/14/14  Yes Mahima Pandey, MD  lactose free nutrition (BOOST) LIQD Take 90 mLs by mouth 3 (three) times daily between meals. Patient taking differently: Take 90 mLs by mouth 2 (two) times daily between meals.  07/01/14  Yes Barton Dubois, MD  levothyroxine (SYNTHROID, LEVOTHROID) 25 MCG tablet TAKE 1 TABLET BY MOUTH DAILY 10/19/14  Yes Tiffany L Reed, DO  lidocaine (LIDODERM) 5 % PLACE 3 PATCHES ONTO THE SKIN DAILY. REMOVE & DISCARD PATCH WITHIN 12 HOURS OR AS DIRECTED BY MD Patient taking differently: PLACE 1-3 PATCHES as needed for pain place ONTO THE SKIN DAILY. REMOVE & DISCARD PATCH WITHIN 12 HOURS OR AS DIRECTED BY MD 02/16/15  Yes Tiffany L Reed, DO  Melatonin 5 MG TABS Take 5 mg by mouth at bedtime as needed (For sleep.).   Yes Historical Provider, MD  memantine (NAMENDA) 10 MG tablet Take 10 mg by mouth 2 (two) times daily.   Yes Historical Provider, MD  Multiple Vitamins-Minerals (ICAPS AREDS FORMULA PO) Take 2 capsules by mouth 2 (two) times daily with a meal.    Yes Historical Provider, MD  Multiple Vitamins-Minerals (MULTIVITAMIN WITH MINERALS) tablet Take 1 tablet by mouth daily.   Yes Historical Provider, MD  polyethylene glycol (MIRALAX / GLYCOLAX) packet Take 17 g by mouth daily as needed for mild constipation. 07/01/14  Yes Barton Dubois, MD  Probiotic Product ([place]ills[place]) CAPS Take 1 capsule by mouth 2 (two) times daily.   Yes Historical Provider, MD  psyllium (METAMUCIL) 58.6 % packet 2 tablespoons two times daily   Yes Historical Provider, MD  valACYclovir (VALTREX) 1000 MG tablet Take 1 tablet (1,000 mg total) by mouth 2 (two) times daily. 03/30/15  Yes Hosie Poisson, MD  VOLTAREN 1 %  GEL Apply 2 g topically 4 (four) times daily.  07/10/14  Yes Historical Provider, MD   Allergies  Allergen Reactions  . Other Other (See Comments)    Dairy products- cause swallowing difficulty    CBC:    Component Value Date/Time   WBC 4.5 04/04/2015 0435   WBC 8.3 04/13/2014 1538   HGB 11.0* 04/04/2015 0435   HCT 34.2* 04/04/2015 0435   PLT 207 04/04/2015 0435   MCV 88.6 04/04/2015 0435   NEUTROABS 5.4 03/31/2015 2020   NEUTROABS 5.2 04/13/2014 1538   LYMPHSABS 1.0 03/31/2015 2020   LYMPHSABS 1.5 04/13/2014 1538   MONOABS 0.4 03/31/2015 2020   EOSABS  0.0 03/31/2015 2020   EOSABS 0.1 04/13/2014 1538   BASOSABS 0.0 03/31/2015 2020   BASOSABS 0.0 04/13/2014 1538   Comprehensive Metabolic Panel:    Component Value Date/Time   NA 139 04/09/2015 0423   NA 138 04/13/2014 1538   K 4.1 04/09/2015 0423   CL 107 04/09/2015 0423   CO2 24 04/09/2015 0423   BUN 12 04/09/2015 0423   BUN 14 04/13/2014 1538   CREATININE 0.66 04/09/2015 0423   GLUCOSE 82 04/09/2015 0423   GLUCOSE 86 04/13/2014 1538   CALCIUM 8.5* 04/09/2015 0423   AST 31 03/31/2015 2020   ALT 19 03/31/2015 2020   ALKPHOS 65 03/31/2015 2020   BILITOT 0.6 03/31/2015 2020   PROT 7.5 03/31/2015 2020   PROT 7.6 01/24/2014 1203   ALBUMIN 3.5 03/31/2015 2020    Physical Exam: Vital Signs: BP 134/71 mmHg  Pulse 56  Temp(Src) 98 F (36.7 C) (Axillary)  Resp 16  Ht 6\' 2"  (1.88 m)  Wt 56.6 kg (124 lb 12.5 oz)  BMI 16.01 kg/m2  SpO2 97% SpO2: SpO2: 97 % O2 Device: O2 Device: Not Delivered O2 Flow Rate:   Intake/output summary:  Intake/Output Summary (Last 24 hours) at 04/13/15 1111 Last data filed at 04/13/15 0953  Gross per 24 hour  Intake 2581.67 ml  Output   4050 ml  Net -1468.33 ml   LBM:   Baseline Weight: Weight: 52.164 kg (115 lb) Most recent weight: Weight: 56.6 kg (124 lb 12.5 oz)  Exam Findings:  General: chronically ill appearing, NAD, non verbal at this time, unable to follow  commands HEENT: Mosit buccal membranes CVS: RRR RESP: decreased in bases, CTA EXTREM: BUE contracted Skin: warm and dry         Palliative Performance Scale: 30 % at best             Additional Data Reviewed: No results for input(s): WBC, HGB, PLT, NA, BUN, CREATININE, ALB in the last 72 hours.   Time In: 0900 Time Out: 1030 Time Total:  90 minutes  Greater than 50%  of this time was spent counseling and coordinating care related to the above assessment and plan.  Signed by: Wadie Lessen, NP  Knox Royalty, NP  04/13/2015, 11:11 AM  Please contact Palliative Medicine Team phone at [phone] for questions and concerns.   Discussed with Dr Doyle Askew

## 2015-04-13 NOTE — Discharge Summary (Signed)
Physician Discharge Summary  Ricky [name] BHA:[id number] DOB: 11-01-1917 DOA: 03/31/2015  PCP: Hollace Kinnier, DO  Admit date: 03/31/2015 Discharge date: 04/13/2015  Recommendations for Outpatient Follow-up:  1. Pt will need to follow up with PCP in 2-3 weeks post discharge  Discharge Diagnoses:  Principal Problem:   Toxic encephalopathy, infectious versus progressive supranuclear palsy Active Problems:   Hypothyroidism   Hypertension   FTT (failure to thrive) in adult   Dysphagia   CKD (chronic kidney disease), stage II   Anemia, chronic disease   Acute encephalopathy   Sepsis   Supranuclear palsies, progressive   UTI (lower urinary tract infection)   Shingles   Decreased level of consciousness   Dehydration   AKI (acute kidney injury)   Pressure ulcer   Discharge Condition: Stable  Diet recommendation: dys III  Brief narrative:    81 y.o. male with a PMH of progressive supranuclear palsy, hypertension, and hypothyroidism, recent hospitalization 03/25/15-03/30/15 for treatment of UTI and shingles who was readmitted 03/31/15 secondary to increased lethargy/decreased LOC. He had been discharged home on Cipro for treatment of pseudomonal UTI. Re-admitted with recurrent encephalopathy with FTT. He lives at home with his son, Ricky [name] and his daughter Ricky [name] who provide all of his custodial care. Barrier to D/C is need for IV Cipro, IVF in the setting of poor oral intake (becomes dehydrated when IVF stopped). Family refused PEG placement and is hopeful that he will improve to baseline at discharge. He will complete his course of Cipro on 04/13/15.   Assessment/Plan:    Principal Problem:  Acute Toxic encephalopathy, infectious versus progressive supranuclear palsy / decreased level of consciousness / failure to thrive in adult - Previous urine culture positive for Pseudomonas. Antibiotics initially broad-spectrum, narrowed to Cipro 04/03/15 with plans to complete course on  04/13/15. - Would benefit from palliative care involvement, PCT consulted  - Catheter changed 03/31/15.  - Family declines feeding tube, respect wishes   Active Problems:  Functional quadriplegia secondary to supranuclear palsy - unable to tolerate any PT, unable to get out of the bed to chair and requires two people assistance  - attempt to get into the chair this afternoon if pt able to tolerate    Hypothyroidism - Continue Synthroid.   Hypertension - Continue hydralazine as needed.   Dysphagia / severe protein calorie malnutrition - SLP done and recommended dysphasia 3 diet with thin liquids. - Continue nutritional supplements.   AKI secondary to dehydration/CKD (chronic kidney disease), stage II - Renal function noted to be slightly worse 04/06/15, IV fluids resumed. - Renal function back to baseline 04/08/15 with resumption of IV fluids. - no need for repeat blood work at this time    Anemia, chronic disease - Hemoglobin stable with no current indication for transfusion. - no signs of active bleeding    UTI (lower urinary tract infection) / possible sepsis - Recent cultures positive for Pseudomonas. Plan for 2 week course of therapy, to complete 04/13/15. - Repeat urine culture 03/31/15 negative. - Continue Cipro.Completed course May 27th, 2016   Shingles - Completed his acyclovir course.   Code Status: Full.  Family Communication: plan of care discussed with son and daughter  Disposition Plan: Home with family May 27th, 2016   IV access:  Peripheral IV  Procedures and diagnostic studies:   Ct Head Wo Contrast 03/31/2015 No acute intracranial findings. Nonstandard positioning does limit evaluation. 2. Chronic atrophy and microvascular disease.   Dg Chest Portable 1 View 03/31/2015 No acute  cardiopulmonary disease. Stable small bilateral nodules unchanged as agree with previous recommendation of chest CT. Mild cardiomegaly.   Dg Chest Port 1  View 03/25/2015 Limited exam without acute abnormality.   Medical Consultants:  None  Other Consultants:  PT/SLP/OT  IAnti-Infectives:   Cipro IV until May 27th, 2016         Discharge Exam: Filed Vitals:   04/13/15 0454  BP: 134/71  Pulse: 56  Temp: 98 F (36.7 C)  Resp: 16   Filed Vitals:   04/12/15 1432 04/12/15 2133 04/13/15 0206 04/13/15 0454  BP: 152/74 125/80 151/79 134/71  Pulse: 58 86 57 56  Temp: 97.4 F (36.3 C)   98 F (36.7 C)  TempSrc: Axillary   Axillary  Resp: 16 16 16 16   Height:      Weight:      SpO2: 99% 98% 100% 97%    General: Pt is alert, NAD, non verbal  Cardiovascular: Regular rate and rhythm, no murmurs, no rubs, no gallops Respiratory: Clear to auscultation bilaterally, no wheezing, no crackles, no rhonchi Abdominal: Soft, non tender, non distended, bowel sounds +, no guarding  Discharge Instructions     Medication List    STOP taking these medications        ciprofloxacin 500 MG tablet  Commonly known as:  CIPRO     valACYclovir 1000 MG tablet  Commonly known as:  VALTREX      TAKE these medications        acetaminophen 500 MG tablet  Commonly known as:  TYLENOL  Take 1,000 mg by mouth 3 (three) times daily.     AMBULATORY NON FORMULARY MEDICATION  - Pressure Air Mattress  - Dx: G60.8, L89.150     aspirin EC 325 MG tablet  Take 325 mg by mouth every evening.     bacitracin ointment  Apply 1 application topically 2 (two) times daily. Applies to head of penis to decrease infection from catheter.     CALTRATE 600+D PLUS MINERALS 600-800 MG-UNIT Chew  Chew 1 tablet by mouth 2 (two) times daily with a meal.     CRANBERRY SOFT PO  Take 2 capsules by mouth 3 (three) times daily.     ferrous sulfate 325 (65 FE) MG tablet  Take 325 mg by mouth every other day.     guaiFENesin 600 MG 12 hr tablet  Commonly known as:  MUCINEX  Take 600 mg by mouth 2 (two) times daily as needed for cough.      hydrALAZINE 25 MG tablet  Commonly known as:  APRESOLINE  Take 1/2 tablet by mouth twice daily to control blood pressure     ICAPS AREDS FORMULA PO  Take 2 capsules by mouth 2 (two) times daily with a meal.     multivitamin with minerals tablet  Take 1 tablet by mouth daily.     lactose free nutrition Liqd  Take 90 mLs by mouth 3 (three) times daily between meals.     levothyroxine 25 MCG tablet  Commonly known as:  SYNTHROID, LEVOTHROID  TAKE 1 TABLET BY MOUTH DAILY     lidocaine 5 %  Commonly known as:  LIDODERM  PLACE 3 PATCHES ONTO THE SKIN DAILY. REMOVE & DISCARD PATCH WITHIN 12 HOURS OR AS DIRECTED BY MD     Melatonin 5 MG Tabs  Take 5 mg by mouth at bedtime as needed (For sleep.).     memantine 10 MG tablet  Commonly known as:  NAMENDA  Take 10 mg by mouth 2 (two) times daily.     PH[facility] Caps  Take 1 capsule by mouth 2 (two) times daily.     polyethylene glycol packet  Commonly known as:  MIRALAX / GLYCOLAX  Take 17 g by mouth daily as needed for mild constipation.     psyllium 58.6 % packet  Commonly known as:  METAMUCIL  2 tablespoons two times daily     Vitamin B-12 5000 MCG Subl  Place 5,000 mcg under the tongue 2 (two) times daily.     VOLTAREN 1 % Gel  Generic drug:  diclofenac sodium  Apply 2 g topically 4 (four) times daily.            Follow-up Information    Follow up with [facility].   Specialty:  Home Health Services   Why:  HHRN/PT   Contact information:   [address] [place] [address] [phone]       Go to REED, TIFFANY, DO.   Specialty:  Geriatric Medicine   Why:  your appointment is on 04/18/15 at 1 pm with Dr. Gretchen Short information:   [address] [place] [address] [phone]       Follow up with [facility].   Specialty:  Home Health Services   Why:  Home Health RN/PT   Contact information:   [address] [place]  [address] [phone]       Follow up with [facility].   Why:  Information systems manager information:   [address] [address] [phone]       Follow up with RE[facility], TIFFANY, DO.   Specialty:  Geriatric Medicine   Contact information:   [address] [place] [address] [phone]        The results of significant diagnostics from this hospitalization (including imaging, microbiology, ancillary and laboratory) are listed below for reference.     Microbiology: No results found for this or any previous visit (from the past 240 hour(s)).   Labs: Basic Metabolic Panel:  Recent Labs Lab 04/08/15 0355 04/09/15 0423  NA 139 139  K 3.2* 4.1  CL 106 107  CO2 25 24  GLUCOSE 83 82  BUN 15 12  CREATININE 0.73 0.66  CALCIUM 8.4* 8.5*   Liver Function Tests: No results for input(s): AST, ALT, ALKPHOS, BILITOT, PROT, ALBUMIN in the last 168 hours. No results for input(s): LIPASE, AMYLASE in the last 168 hours. No results for input(s): AMMONIA in the last 168 hours. CBC: No results for input(s): WBC, NEUTROABS, HGB, HCT, MCV, PLT in the last 168 hours. Cardiac Enzymes: No results for input(s): CKTOTAL, CKMB, CKMBINDEX, TROPONINI in the last 168 hours. BNP: BNP (last 3 results) No results for input(s): BNP in the last 8760 hours.  ProBNP (last 3 results) No results for input(s): PROBNP in the last 8760 hours.  CBG: No results for input(s): GLUCAP in the last 168 hours.   SIGNED: Time coordinating discharge: 30 minutes  Faye Ramsay, MD  [facility] 04/13/2015, 6:43 AM Pager [phone]  If 7PM-7AM, please contact night-coverage [email or web address] Password [email or web address]

## 2015-04-18 ENCOUNTER — Ambulatory Visit (INDEPENDENT_AMBULATORY_CARE_PROVIDER_SITE_OTHER): Payer: Medicare Other | Admitting: Internal Medicine

## 2015-04-18 ENCOUNTER — Encounter: Payer: Self-pay | Admitting: Internal Medicine

## 2015-04-18 ENCOUNTER — Ambulatory Visit
Admission: RE | Admit: 2015-04-18 | Discharge: 2015-04-18 | Disposition: A | Discharge status: Home / Self Care | Payer: Medicare HMO | Source: Ambulatory Visit | Attending: Internal Medicine | Admitting: Internal Medicine

## 2015-04-18 ENCOUNTER — Other Ambulatory Visit: Payer: Self-pay | Admitting: Internal Medicine

## 2015-04-18 VITALS — BP 126/80 | HR 69 | Temp 98.4°F | Resp 20 | Ht 74.0 in

## 2015-04-18 DIAGNOSIS — R2689 Other abnormalities of gait and mobility: Secondary | ICD-10-CM | POA: Diagnosis not present

## 2015-04-18 DIAGNOSIS — G309 Alzheimer's disease, unspecified: Secondary | ICD-10-CM | POA: Diagnosis not present

## 2015-04-18 DIAGNOSIS — R0602 Shortness of breath: Secondary | ICD-10-CM

## 2015-04-18 DIAGNOSIS — R339 Retention of urine, unspecified: Secondary | ICD-10-CM

## 2015-04-18 DIAGNOSIS — R1310 Dysphagia, unspecified: Secondary | ICD-10-CM

## 2015-04-18 DIAGNOSIS — R609 Edema, unspecified: Secondary | ICD-10-CM

## 2015-04-18 DIAGNOSIS — R627 Adult failure to thrive: Secondary | ICD-10-CM

## 2015-04-18 DIAGNOSIS — F0280 Dementia in other diseases classified elsewhere without behavioral disturbance: Secondary | ICD-10-CM

## 2015-04-18 NOTE — Patient Instructions (Signed)
Keep legs elevated when seated  Follow up with Dr Mariea Clonts as scheduled  Continue current medications as ordered  Chest xray ordered  Home health ordered

## 2015-04-18 NOTE — Progress Notes (Signed)
Patient ID: Ricky [name], male   DOB: 17-Jan-1917, 82 y.o.   MRN: [id number]    Location:    [facility]   Place of Service:   OFFICE   Chief Complaint  Patient presents with  . Hospitalization Follow-up    Hospital follow-up    HPI:  81 yo male seen today for hospital f/u. He had encephalopathy and syncopal episode which sent him to hospital during the first admission. He was tx with IVF and IV abx/antivirals. He was d/c'd then re-admitted due to UTI which was treated, dehydration tx with IVF aggressively and possible supranuclear palsy. Hospital records reviewed. Anemia noted. Renal fxn returned to baseline. Foley cath was changed. He did have weakness after he got home and has been slowly improving. Appetite improving. Not talking as much as he usually does. Family noticed labored breathing and ankle swelling. Needs HH eval with PT/OT/RN. May need ST eval. Alvis Lemmings is the [facility] agency. Son believes he may have misplaced the Rx for transit w/c   Past Medical History  Diagnosis Date  . IBS (irritable bowel syndrome)   . Diverticulosis   . Cataract   . OAB (overactive bladder)   . Macular degeneration   . Alzheimer disease   . Hypertension   . Arthritis   . Osteoporosis   . Parkinsonian syndrome   . Syncope, vasovagal   . Neuromuscular disorder     parkisonian syndrome  . 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 Unspecified transient cerebral ischemia   . Unspecified urinary incontinence   . Senile dementia, uncomplicated   . Unspecified constipation   . Hypothyroidism   . Anemia, unspecified   . Depression   . Alzheimer's disease   . Gout, unspecified   . Thoracic or lumbosacral neuritis or radiculitis, unspecified   . Progressive supranuclear palsy   . Unspecified transient cerebral ischemia   . Unspecified urinary incontinence   . Syncope and collapse   . Paralysis agitans   . Unspecified transient cerebral ischemia   . Pneumonitis due to  inhalation of food or vomitus   . Cellulitis and abscess of oral soft tissues   . Screening for lipoid disorders   . Senile dementia, uncomplicated   . Unspecified constipation   . Disorder of bone and cartilage, unspecified   . Other specified acquired hypothyroidism   . Unspecified hypothyroidism   . Anemia, unspecified   . Depressive disorder, not elsewhere classified   . Alzheimer's disease   . Paralysis agitans   . Pneumonia, organism unspecified   . Pseudomonas infection in conditions classified elsewhere and of unspecified site   . Unspecified essential hypertension   . Urinary tract infection, site not specified   . Osteoarthrosis, unspecified whether generalized or localized, unspecified site   . Osteoporosis, unspecified   . Gout, unspecified   . Thoracic or lumbosacral neuritis or radiculitis, unspecified   . Stroke     Past Surgical History  Procedure Laterality Date  . Esophagogastroduodenoscopy    . Eye surgery  2006    LEFT CATARACT    Patient Care Team: Gayland Curry, DO as PCP - General (Geriatric Medicine)  History   Social History  . Marital Status: Single    Spouse Name: N/A  . Number of Children: N/A  . Years of Education: N/A   Occupational History  . Not on file.   Social History Main Topics  . Smoking status: Former Research scientist (life sciences)  . Smokeless tobacco: Never  Used     Comment: QUIT SMOKING BACK IN THE LATE 50'S  . Alcohol Use: No  . Drug Use: No  . Sexual Activity: No   Other Topics Concern  . Not on file   Social History Narrative     reports that he has quit smoking. He has never used smokeless tobacco. He reports that he does not drink alcohol or use illicit drugs.  Allergies  Allergen Reactions  . Other Other (See Comments)    Dairy products- cause swallowing difficulty     Medications: Patient's Medications  New Prescriptions   No medications on file  Previous Medications   ACETAMINOPHEN (TYLENOL) 500 MG TABLET    Take 1,000  mg by mouth 3 (three) times daily.    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   BACITRACIN OINTMENT    Apply 1 application topically 2 (two) times daily. Applies to head of penis to decrease infection from catheter.   CALCIUM CARBONATE-VIT D-MIN (CALTRATE 600+D PLUS) 600-800 MG-UNIT CHEW    Chew 1 tablet by mouth 2 (two) times daily with a meal.    CRANBERRY SOFT PO    Take 2 capsules by mouth 3 (three) times daily.    CYANOCOBALAMIN (VITAMIN B-12) 5000 MCG SUBL    Place 5,000 mcg under the tongue 2 (two) times daily.   FERROUS SULFATE 325 (65 FE) MG TABLET    Take 325 mg by mouth every other day.    GUAIFENESIN (MUCINEX) 600 MG 12 HR TABLET    Take 600 mg by mouth 2 (two) times daily as needed for cough.   HYDRALAZINE (APRESOLINE) 25 MG TABLET    Take 1/2 tablet by mouth twice daily to control blood pressure   LACTOSE FREE NUTRITION (BOOST) LIQD    Take 90 mLs by mouth 3 (three) times daily between meals.   LEVOTHYROXINE (SYNTHROID, LEVOTHROID) 25 MCG TABLET    TAKE 1 TABLET BY MOUTH DAILY   LIDOCAINE (LIDODERM) 5 %    PLACE 3 PATCHES ONTO THE SKIN DAILY. REMOVE & DISCARD PATCH WITHIN 12 HOURS OR AS DIRECTED BY MD   MELATONIN 5 MG TABS    Take 5 mg by mouth at bedtime as needed (For sleep.).   MEMANTINE (NAMENDA) 10 MG TABLET    Take 10 mg by mouth 2 (two) times daily.   MULTIPLE VITAMINS-MINERALS (ICAPS AREDS FORMULA PO)    Take 2 capsules by mouth 2 (two) times daily with a meal.    POLYETHYLENE GLYCOL (MIRALAX / GLYCOLAX) PACKET    Take 17 g by mouth daily as needed for mild constipation.   PROBIOTIC PRODUCT (PHILLIPS COLON HEALTH) CAPS    Take 1 capsule by mouth 2 (two) times daily.   PSYLLIUM (METAMUCIL) 58.6 % PACKET    2 tablespoons two times daily   VOLTAREN 1 % GEL    Apply 2 g topically 4 (four) times daily.   Modified Medications   No medications on file  Discontinued  Medications   MULTIPLE VITAMINS-MINERALS (MULTIVITAMIN WITH MINERALS) TABLET    Take 1 tablet by mouth daily.    Review of Systems  Unable to perform ROS: Dementia    Filed Vitals:   04/18/15 1540  BP: 126/80  Pulse: 69  Temp: 98.4 F (36.9 C)  TempSrc: Oral  Resp: 20  Height: 6' 2" (1.88 m)  SpO2: 95%   There is no weight on file to calculate BMI.  Physical Exam  Constitutional: He appears well-developed. No distress.  Grail appearing, sitting in w/c quietly. He slouches frequently  HENT:  MMM  Eyes: Pupils are equal, round, and reactive to light. No scleral icterus.  Neck: Neck supple. No JVD present. No tracheal deviation present. No thyromegaly present.  Cardiovascular: Normal rate, regular rhythm and intact distal pulses.  PMI is not displaced.  Exam reveals no gallop, no distant heart sounds and no friction rub.   Murmur heard.  Systolic murmur is present with a grade of 1/6  No carotid bruit b/l; LV heave present; +1 pitting distal LE edema. B/l leg braces intact  Pulmonary/Chest: Effort normal and breath sounds normal. No stridor. He has no wheezes. He has no rales. He exhibits no tenderness.  Abdominal: Soft. Bowel sounds are normal. He exhibits no distension, no abdominal bruit, no pulsatile midline mass and no mass. There is no tenderness. There is no rebound and no guarding.  Lymphadenopathy:    He has no cervical adenopathy.  Neurological: He is alert. He has normal reflexes.  Occasionally follows simple commands  Skin: Skin is warm and dry. No rash noted. He is not diaphoretic.  Psychiatric: He has a normal mood and affect. His behavior is normal. His speech is delayed (and dysarthric).      Labs reviewed: Recent Results (from the past 2160 hour(s))  Urinalysis, Routine w reflex microscopic     Status: Abnormal   Collection Time: 03/25/15  2:26 PM  Result Value Ref Range   Color, Urine AMBER (A) YELLOW    Comment: BIOCHEMICALS MAY BE AFFECTED BY COLOR    APPearance TURBID (A) CLEAR   Specific Gravity, Urine 1.021 1.005 - 1.030   pH 7.5 5.0 - 8.0   Glucose, UA NEGATIVE NEGATIVE mg/dL   Hgb urine dipstick LARGE (A) NEGATIVE   Bilirubin Urine NEGATIVE NEGATIVE   Ketones, ur NEGATIVE NEGATIVE mg/dL   Protein, ur 100 (A) NEGATIVE mg/dL   Urobilinogen, UA 1.0 0.0 - 1.0 mg/dL   Nitrite NEGATIVE NEGATIVE   Leukocytes, UA LARGE (A) NEGATIVE  Urine culture     Status: None   Collection Time: 03/25/15  2:26 PM  Result Value Ref Range   Specimen Description URINE, CATHETERIZED    Special Requests NONE    Colony Count      >=100,000 COLONIES/ML Performed at [place] Performed at [facility]    Report Status 03/27/2015 FINAL    Organism ID, Bacteria PSEUDOMONAS AERUGINOSA       Susceptibility   Pseudomonas aeruginosa - MIC*    CEFEPIME 8 SENSITIVE Sensitive     CEFTAZIDIME 4 SENSITIVE Sensitive     CIPROFLOXACIN 1 SENSITIVE Sensitive     GENTAMICIN 2 SENSITIVE Sensitive     IMIPENEM 2 SENSITIVE Sensitive     PIP/TAZO 32 SENSITIVE Sensitive     TOBRAMYCIN <=1 SENSITIVE Sensitive     * PSEUDOMONAS AERUGINOSA  Urine microscopic-add on     Status: Abnormal   Collection Time: 03/25/15  2:26 PM  Result Value Ref Range   WBC, UA TOO NUMEROUS TO COUNT <3 WBC/hpf   RBC / HPF TOO NUMEROUS TO COUNT <3 RBC/hpf   Bacteria, UA MANY (A) RARE   Crystals TRIPLE PHOSPHATE CRYSTALS (A) NEGATIVE   Urine-Other LESS THAN 10 mL OF URINE SUBMITTED  Comment: MICROSCOPIC EXAM PERFORMED ON UNCONCENTRATED URINE  CBC with Differential/Platelet     Status: Abnormal   Collection Time: 03/25/15  3:20 PM  Result Value Ref Range   WBC 7.7 4.0 - 10.5 K/uL   RBC 4.57 4.22 - 5.81 MIL/uL   Hemoglobin 13.0 13.0 - 17.0 g/dL   HCT 40.9 39.0 - 52.0 %   MCV 89.5 78.0 - 100.0 fL   MCH 28.4 26.0 - 34.0 pg   MCHC 31.8 30.0 - 36.0 g/dL   RDW 14.0 11.5 - 15.5 %   Platelets 213 150 - 400 K/uL   Neutrophils  Relative % 81 (H) 43 - 77 %   Neutro Abs 6.2 1.7 - 7.7 K/uL   Lymphocytes Relative 14 12 - 46 %   Lymphs Abs 1.1 0.7 - 4.0 K/uL   Monocytes Relative 5 3 - 12 %   Monocytes Absolute 0.4 0.1 - 1.0 K/uL   Eosinophils Relative 0 0 - 5 %   Eosinophils Absolute 0.0 0.0 - 0.7 K/uL   Basophils Relative 0 0 - 1 %   Basophils Absolute 0.0 0.0 - 0.1 K/uL  Basic metabolic panel     Status: Abnormal   Collection Time: 03/25/15  3:20 PM  Result Value Ref Range   Sodium 137 135 - 145 mmol/L   Potassium 4.2 3.5 - 5.1 mmol/L   Chloride 101 101 - 111 mmol/L   CO2 27 22 - 32 mmol/L   Glucose, Bld 100 (H) 70 - 99 mg/dL   BUN 24 (H) 6 - 20 mg/dL   Creatinine, Ser 1.14 0.61 - 1.24 mg/dL   Calcium 9.4 8.9 - 10.3 mg/dL   GFR calc non Af Amer 53 (L) >60 mL/min   GFR calc Af Amer >60 >60 mL/min    Comment: (NOTE) The eGFR has been calculated using the CKD EPI equation. This calculation has not been validated in all clinical situations. eGFR's persistently <60 mL/min signify possible Chronic Kidney Disease.    Anion gap 9 5 - 15  TSH     Status: None   Collection Time: 03/25/15  5:20 PM  Result Value Ref Range   TSH 1.228 0.350 - 4.500 uIU/mL  CBC     Status: Abnormal   Collection Time: 03/26/15  4:25 AM  Result Value Ref Range   WBC 5.4 4.0 - 10.5 K/uL   RBC 3.90 (L) 4.22 - 5.81 MIL/uL   Hemoglobin 11.2 (L) 13.0 - 17.0 g/dL   HCT 34.7 (L) 39.0 - 52.0 %   MCV 89.0 78.0 - 100.0 fL   MCH 28.7 26.0 - 34.0 pg   MCHC 32.3 30.0 - 36.0 g/dL   RDW 14.1 11.5 - 15.5 %   Platelets 195 150 - 400 K/uL  Basic metabolic panel     Status: Abnormal   Collection Time: 03/26/15  4:25 AM  Result Value Ref Range   Sodium 138 135 - 145 mmol/L   Potassium 3.6 3.5 - 5.1 mmol/L   Chloride 107 101 - 111 mmol/L   CO2 23 22 - 32 mmol/L   Glucose, Bld 80 70 - 99 mg/dL   BUN 18 6 - 20 mg/dL   Creatinine, Ser 0.71 0.61 - 1.24 mg/dL   Calcium 8.8 (L) 8.9 - 10.3 mg/dL   GFR calc non Af Amer >60 >60 mL/min   GFR calc  Af Amer >60 >60 mL/min    Comment: (NOTE) The eGFR has been calculated using the CKD EPI equation. This  calculation has not been validated in all clinical situations. eGFR's persistently <60 mL/min signify possible Chronic Kidney Disease.    Anion gap 8 5 - 15  CBC     Status: Abnormal   Collection Time: 03/29/15  3:51 AM  Result Value Ref Range   WBC 4.8 4.0 - 10.5 K/uL   RBC 3.90 (L) 4.22 - 5.81 MIL/uL   Hemoglobin 10.9 (L) 13.0 - 17.0 g/dL   HCT 34.9 (L) 39.0 - 52.0 %   MCV 89.5 78.0 - 100.0 fL   MCH 27.9 26.0 - 34.0 pg   MCHC 31.2 30.0 - 36.0 g/dL   RDW 14.2 11.5 - 15.5 %   Platelets 192 150 - 400 K/uL  Basic metabolic panel     Status: Abnormal   Collection Time: 03/29/15  3:51 AM  Result Value Ref Range   Sodium 137 135 - 145 mmol/L   Potassium 3.5 3.5 - 5.1 mmol/L   Chloride 103 101 - 111 mmol/L   CO2 27 22 - 32 mmol/L   Glucose, Bld 84 65 - 99 mg/dL   BUN 6 6 - 20 mg/dL   Creatinine, Ser 0.57 (L) 0.61 - 1.24 mg/dL   Calcium 8.5 (L) 8.9 - 10.3 mg/dL   GFR calc non Af Amer >60 >60 mL/min   GFR calc Af Amer >60 >60 mL/min    Comment: (NOTE) The eGFR has been calculated using the CKD EPI equation. This calculation has not been validated in all clinical situations. eGFR's persistently <60 mL/min signify possible Chronic Kidney Disease.    Anion gap 7 5 - 15  Comprehensive metabolic panel     Status: Abnormal   Collection Time: 03/31/15  8:20 PM  Result Value Ref Range   Sodium 136 135 - 145 mmol/L   Potassium 4.3 3.5 - 5.1 mmol/L   Chloride 99 (L) 101 - 111 mmol/L   CO2 27 22 - 32 mmol/L   Glucose, Bld 101 (H) 65 - 99 mg/dL   BUN 16 6 - 20 mg/dL   Creatinine, Ser 1.06 0.61 - 1.24 mg/dL   Calcium 9.4 8.9 - 10.3 mg/dL   Total Protein 7.5 6.5 - 8.1 g/dL   Albumin 3.5 3.5 - 5.0 g/dL   AST 31 15 - 41 U/L   ALT 19 17 - 63 U/L   Alkaline Phosphatase 65 38 - 126 U/L   Total Bilirubin 0.6 0.3 - 1.2 mg/dL   GFR calc non Af Amer 58 (L) >60 mL/min   GFR calc Af Amer  >60 >60 mL/min    Comment: (NOTE) The eGFR has been calculated using the CKD EPI equation. This calculation has not been validated in all clinical situations. eGFR's persistently <60 mL/min signify possible Chronic Kidney Disease.    Anion gap 10 5 - 15  CBC with Differential     Status: Abnormal   Collection Time: 03/31/15  8:20 PM  Result Value Ref Range   WBC 6.7 4.0 - 10.5 K/uL   RBC 4.24 4.22 - 5.81 MIL/uL   Hemoglobin 12.3 (L) 13.0 - 17.0 g/dL   HCT 38.1 (L) 39.0 - 52.0 %   MCV 89.9 78.0 - 100.0 fL   MCH 29.0 26.0 - 34.0 pg   MCHC 32.3 30.0 - 36.0 g/dL   RDW 14.3 11.5 - 15.5 %   Platelets 212 150 - 400 K/uL   Neutrophils Relative % 80 (H) 43 - 77 %   Neutro Abs 5.4 1.7 - 7.7 K/uL  Lymphocytes Relative 15 12 - 46 %   Lymphs Abs 1.0 0.7 - 4.0 K/uL   Monocytes Relative 5 3 - 12 %   Monocytes Absolute 0.4 0.1 - 1.0 K/uL   Eosinophils Relative 0 0 - 5 %   Eosinophils Absolute 0.0 0.0 - 0.7 K/uL   Basophils Relative 0 0 - 1 %   Basophils Absolute 0.0 0.0 - 0.1 K/uL  Troponin I     Status: Abnormal   Collection Time: 03/31/15  8:20 PM  Result Value Ref Range   Troponin I 0.05 (H) <0.031 ng/mL    Comment:        PERSISTENTLY INCREASED TROPONIN VALUES IN THE RANGE OF 0.04-0.49 ng/mL CAN BE SEEN IN:       -UNSTABLE ANGINA       -CONGESTIVE HEART FAILURE       -MYOCARDITIS       -CHEST TRAUMA       -ARRYHTHMIAS       -LATE PRESENTING MYOCARDIAL INFARCTION       -COPD   CLINICAL FOLLOW-UP RECOMMENDED.   I-Stat CG4 Lactic Acid, ED     Status: Abnormal   Collection Time: 03/31/15  8:23 PM  Result Value Ref Range   Lactic Acid, Venous 3.25 (HH) 0.5 - 2.0 mmol/L   Comment NOTIFIED PHYSICIAN   Culture, blood (routine x 2)     Status: None   Collection Time: 03/31/15  8:23 PM  Result Value Ref Range   Specimen Description BLOOD BLOOD RIGHT FOREARM UPPER    Special Requests BOTTLES DRAWN AEROBIC AND ANAEROBIC 5CC    Culture      NO GROWTH 5 DAYS Performed at [facility]    Report Status 04/07/2015 FINAL   Culture, blood (routine x 2)     Status: None   Collection Time: 03/31/15  8:23 PM  Result Value Ref Range   Specimen Description BLOOD RIGHT HAND    Special Requests BOTTLES DRAWN AEROBIC AND ANAEROBIC 5CC    Culture      MICROCOCCUS SPECIES Note: Standardized susceptibility testing for this organism is not available. Note: CRITICAL RESULT CALLED TO, READ BACK BY AND VERIFIED WITH: KATIE D 04/05/15 BY INGRAM A 1031AM Performed at [facility]    Report Status 04/05/2015 FINAL   Urinalysis, Routine w reflex microscopic     Status: Abnormal   Collection Time: 03/31/15  9:10 PM  Result Value Ref Range   Color, Urine YELLOW YELLOW   APPearance CLOUDY (A) CLEAR   Specific Gravity, Urine 1.021 1.005 - 1.030   pH 6.0 5.0 - 8.0   Glucose, UA NEGATIVE NEGATIVE mg/dL   Hgb urine dipstick MODERATE (A) NEGATIVE   Bilirubin Urine NEGATIVE NEGATIVE   Ketones, ur NEGATIVE NEGATIVE mg/dL   Protein, ur 30 (A) NEGATIVE mg/dL   Urobilinogen, UA 1.0 0.0 - 1.0 mg/dL   Nitrite NEGATIVE NEGATIVE   Leukocytes, UA MODERATE (A) NEGATIVE  Urine culture     Status: None   Collection Time: 03/31/15  9:10 PM  Result Value Ref Range   Specimen Description URINE, CATHETERIZED    Special Requests NONE    Colony Count NO GROWTH Performed at [facility]     Culture NO GROWTH Performed at [facility]     Report Status 04/01/2015 FINAL   Urine microscopic-add on     Status: Abnormal   Collection Time: 03/31/15  9:10 PM  Result Value Ref Range   Squamous Epithelial /  LPF FEW (A) RARE   WBC, UA 21-50 <3 WBC/hpf   RBC / HPF 21-50 <3 RBC/hpf   Bacteria, UA RARE RARE   Casts HYALINE CASTS (A) NEGATIVE   Urine-Other MUCOUS PRESENT   I-Stat CG4 Lactic Acid, ED  (not at [facility])     Status: Abnormal   Collection Time: 03/31/15 10:59 PM  Result Value Ref Range   Lactic Acid, Venous 2.02 (HH) 0.5 - 2.0 mmol/L  Lactic acid, plasma     Status:  None   Collection Time: 04/01/15  1:40 AM  Result Value Ref Range   Lactic Acid, Venous 1.9 0.5 - 2.0 mmol/L  Basic metabolic panel     Status: Abnormal   Collection Time: 04/01/15  1:40 AM  Result Value Ref Range   Sodium 137 135 - 145 mmol/L   Potassium 3.7 3.5 - 5.1 mmol/L   Chloride 103 101 - 111 mmol/L   CO2 25 22 - 32 mmol/L   Glucose, Bld 98 65 - 99 mg/dL   BUN 13 6 - 20 mg/dL   Creatinine, Ser 0.71 0.61 - 1.24 mg/dL   Calcium 8.8 (L) 8.9 - 10.3 mg/dL   GFR calc non Af Amer >60 >60 mL/min   GFR calc Af Amer >60 >60 mL/min    Comment: (NOTE) The eGFR has been calculated using the CKD EPI equation. This calculation has not been validated in all clinical situations. eGFR's persistently <60 mL/min signify possible Chronic Kidney Disease.    Anion gap 9 5 - 15  CBC     Status: Abnormal   Collection Time: 04/01/15  1:40 AM  Result Value Ref Range   WBC 6.7 4.0 - 10.5 K/uL   RBC 3.89 (L) 4.22 - 5.81 MIL/uL   Hemoglobin 11.1 (L) 13.0 - 17.0 g/dL   HCT 34.7 (L) 39.0 - 52.0 %   MCV 89.2 78.0 - 100.0 fL   MCH 28.5 26.0 - 34.0 pg   MCHC 32.0 30.0 - 36.0 g/dL   RDW 14.3 11.5 - 15.5 %   Platelets 197 150 - 400 K/uL  Procalcitonin     Status: None   Collection Time: 04/01/15  1:40 AM  Result Value Ref Range   Procalcitonin <0.10 ng/mL    Comment:        Interpretation: PCT (Procalcitonin) <= 0.5 ng/mL: Systemic infection (sepsis) is not likely. Local bacterial infection is possible. (NOTE)         ICU PCT Algorithm               Non ICU PCT Algorithm    ----------------------------     ------------------------------         PCT < 0.25 ng/mL                 PCT < 0.1 ng/mL     Stopping of antibiotics            Stopping of antibiotics       strongly encouraged.               strongly encouraged.    ----------------------------     ------------------------------       PCT level decrease by               PCT < 0.25 ng/mL       >= 80% from peak PCT       OR PCT 0.25 - 0.5  ng/mL          Stopping  of antibiotics                                             encouraged.     Stopping of antibiotics           encouraged.    ----------------------------     ------------------------------       PCT level decrease by              PCT >= 0.25 ng/mL       < 80% from peak PCT        AND PCT >= 0.5 ng/mL            Continuin g antibiotics                                              encouraged.       Continuing antibiotics            encouraged.    ----------------------------     ------------------------------     PCT level increase compared          PCT > 0.5 ng/mL         with peak PCT AND          PCT >= 0.5 ng/mL             Escalation of antibiotics                                          strongly encouraged.      Escalation of antibiotics        strongly encouraged. Performed at [facility]   Lactic acid, plasma     Status: None   Collection Time: 04/01/15  3:35 AM  Result Value Ref Range   Lactic Acid, Venous 1.3 0.5 - 2.0 mmol/L  Protime-INR     Status: None   Collection Time: 04/01/15  3:35 AM  Result Value Ref Range   Prothrombin Time SPECIMEN CLOTTED 11.6 - 15.2 seconds    Comment: PLEASE DISREGARD RESULTS AND SEE RECOLLECT. CORRECTED ON 05/15 AT 0458: PREVIOUSLY REPORTED AS 15.1    INR SPECIMEN CLOTTED 0.00 - 1.49    Comment: PLEASE DISREGARD RESULTS AND SEE RECOLLECT. CORRECTED ON 05/15 AT 0458: PREVIOUSLY REPORTED AS 1.18   Protime-INR     Status: Abnormal   Collection Time: 04/01/15  6:02 AM  Result Value Ref Range   Prothrombin Time 16.0 (H) 11.6 - 15.2 seconds   INR 1.26 0.00 - 1.49  APTT     Status: None   Collection Time: 04/01/15  6:02 AM  Result Value Ref Range   aPTT 25 24 - 37 seconds  CBC     Status: Abnormal   Collection Time: 04/02/15  4:49 AM  Result Value Ref Range   WBC 5.2 4.0 - 10.5 K/uL   RBC 3.68 (L) 4.22 - 5.81 MIL/uL   Hemoglobin 10.9 (L) 13.0 - 17.0 g/dL   HCT 32.9 (L) 39.0 - 52.0 %   MCV 89.4 78.0 -  100.0 fL   MCH 29.6 26.0 - 34.0 pg   MCHC 33.1 30.0 - 36.0 g/dL  RDW 14.5 11.5 - 15.5 %   Platelets 199 150 - 400 K/uL  Basic metabolic panel     Status: Abnormal   Collection Time: 04/02/15  4:49 AM  Result Value Ref Range   Sodium 136 135 - 145 mmol/L   Potassium 3.1 (L) 3.5 - 5.1 mmol/L   Chloride 102 101 - 111 mmol/L   CO2 25 22 - 32 mmol/L   Glucose, Bld 80 65 - 99 mg/dL   BUN 8 6 - 20 mg/dL   Creatinine, Ser 0.58 (L) 0.61 - 1.24 mg/dL   Calcium 8.7 (L) 8.9 - 10.3 mg/dL   GFR calc non Af Amer >60 >60 mL/min   GFR calc Af Amer >60 >60 mL/min    Comment: (NOTE) The eGFR has been calculated using the CKD EPI equation. This calculation has not been validated in all clinical situations. eGFR's persistently <60 mL/min signify possible Chronic Kidney Disease.    Anion gap 9 5 - 15  CBC     Status: Abnormal   Collection Time: 04/03/15  8:12 AM  Result Value Ref Range   WBC 4.7 4.0 - 10.5 K/uL   RBC 4.21 (L) 4.22 - 5.81 MIL/uL   Hemoglobin 12.1 (L) 13.0 - 17.0 g/dL   HCT 37.0 (L) 39.0 - 52.0 %   MCV 87.9 78.0 - 100.0 fL   MCH 28.7 26.0 - 34.0 pg   MCHC 32.7 30.0 - 36.0 g/dL   RDW 14.5 11.5 - 15.5 %   Platelets 220 150 - 400 K/uL  Basic metabolic panel     Status: Abnormal   Collection Time: 04/03/15  8:12 AM  Result Value Ref Range   Sodium 132 (L) 135 - 145 mmol/L   Potassium 3.9 3.5 - 5.1 mmol/L    Comment: DELTA CHECK NOTED REPEATED TO VERIFY NO VISIBLE HEMOLYSIS    Chloride 101 101 - 111 mmol/L   CO2 25 22 - 32 mmol/L   Glucose, Bld 93 65 - 99 mg/dL   BUN 9 6 - 20 mg/dL   Creatinine, Ser 0.49 (L) 0.61 - 1.24 mg/dL   Calcium 8.1 (L) 8.9 - 10.3 mg/dL   GFR calc non Af Amer >60 >60 mL/min   GFR calc Af Amer >60 >60 mL/min    Comment: (NOTE) The eGFR has been calculated using the CKD EPI equation. This calculation has not been validated in all clinical situations. eGFR's persistently <60 mL/min signify possible Chronic Kidney Disease.    Anion gap 6 5 - 15    Vancomycin, trough     Status: Abnormal   Collection Time: 04/03/15  9:43 PM  Result Value Ref Range   Vancomycin Tr 6 (L) 10.0 - 20.0 ug/mL  CBC     Status: Abnormal   Collection Time: 04/04/15  4:35 AM  Result Value Ref Range   WBC 4.5 4.0 - 10.5 K/uL   RBC 3.86 (L) 4.22 - 5.81 MIL/uL   Hemoglobin 11.0 (L) 13.0 - 17.0 g/dL   HCT 34.2 (L) 39.0 - 52.0 %   MCV 88.6 78.0 - 100.0 fL   MCH 28.5 26.0 - 34.0 pg   MCHC 32.2 30.0 - 36.0 g/dL   RDW 14.7 11.5 - 15.5 %   Platelets 207 150 - 400 K/uL  Basic metabolic panel     Status: Abnormal   Collection Time: 04/04/15  4:35 AM  Result Value Ref Range   Sodium 135 135 - 145 mmol/L   Potassium 3.6 3.5 - 5.1  mmol/L   Chloride 102 101 - 111 mmol/L   CO2 24 22 - 32 mmol/L   Glucose, Bld 95 65 - 99 mg/dL   BUN 11 6 - 20 mg/dL   Creatinine, Ser 0.57 (L) 0.61 - 1.24 mg/dL   Calcium 8.6 (L) 8.9 - 10.3 mg/dL   GFR calc non Af Amer >60 >60 mL/min   GFR calc Af Amer >60 >60 mL/min    Comment: (NOTE) The eGFR has been calculated using the CKD EPI equation. This calculation has not been validated in all clinical situations. eGFR's persistently <60 mL/min signify possible Chronic Kidney Disease.    Anion gap 9 5 - 15  Basic metabolic panel     Status: Abnormal   Collection Time: 04/06/15  4:00 AM  Result Value Ref Range   Sodium 138 135 - 145 mmol/L   Potassium 3.7 3.5 - 5.1 mmol/L   Chloride 107 101 - 111 mmol/L   CO2 25 22 - 32 mmol/L   Glucose, Bld 101 (H) 65 - 99 mg/dL   BUN 18 6 - 20 mg/dL   Creatinine, Ser 1.42 (H) 0.61 - 1.24 mg/dL   Calcium 8.8 (L) 8.9 - 10.3 mg/dL   GFR calc non Af Amer 40 (L) >60 mL/min   GFR calc Af Amer 47 (L) >60 mL/min    Comment: (NOTE) The eGFR has been calculated using the CKD EPI equation. This calculation has not been validated in all clinical situations. eGFR's persistently <60 mL/min signify possible Chronic Kidney Disease.    Anion gap 6 5 - 15  Basic metabolic panel     Status: Abnormal    Collection Time: 04/08/15  3:55 AM  Result Value Ref Range   Sodium 139 135 - 145 mmol/L   Potassium 3.2 (L) 3.5 - 5.1 mmol/L   Chloride 106 101 - 111 mmol/L   CO2 25 22 - 32 mmol/L   Glucose, Bld 83 65 - 99 mg/dL   BUN 15 6 - 20 mg/dL   Creatinine, Ser 0.73 0.61 - 1.24 mg/dL   Calcium 8.4 (L) 8.9 - 10.3 mg/dL   GFR calc non Af Amer >60 >60 mL/min   GFR calc Af Amer >60 >60 mL/min    Comment: (NOTE) The eGFR has been calculated using the CKD EPI equation. This calculation has not been validated in all clinical situations. eGFR's persistently <60 mL/min signify possible Chronic Kidney Disease.    Anion gap 8 5 - 15  Basic metabolic panel     Status: Abnormal   Collection Time: 04/09/15  4:23 AM  Result Value Ref Range   Sodium 139 135 - 145 mmol/L   Potassium 4.1 3.5 - 5.1 mmol/L    Comment: DELTA CHECK NOTED REPEATED TO VERIFY    Chloride 107 101 - 111 mmol/L   CO2 24 22 - 32 mmol/L   Glucose, Bld 82 65 - 99 mg/dL   BUN 12 6 - 20 mg/dL   Creatinine, Ser 0.66 0.61 - 1.24 mg/dL   Calcium 8.5 (L) 8.9 - 10.3 mg/dL   GFR calc non Af Amer >60 >60 mL/min   GFR calc Af Amer >60 >60 mL/min    Comment: (NOTE) The eGFR has been calculated using the CKD EPI equation. This calculation has not been validated in all clinical situations. eGFR's persistently <60 mL/min signify possible Chronic Kidney Disease.    Anion gap 8 5 - 15     Ct Head Wo Contrast  03/31/2015  CLINICAL DATA:  Mental status change.  Not as responsive.  EXAM: CT HEAD WITHOUT CONTRAST  TECHNIQUE: Contiguous axial images were obtained from the base of the skull through the vertex without intravenous contrast.  COMPARISON:  Head CT 04/12/2014  FINDINGS: Patient is extremely kyphotic therefore non standard orientation was scanned. The generates bone artifact.  No acute intracranial hemorrhage. No focal mass lesion. No CT evidence of acute infarction. No midline shift or mass effect. No hydrocephalus. Basilar cisterns  are patent.  Generalized cortical atrophy and ventricular dilatation.  Paranasal sinuses and mastoid air cells are clear. Chronic opacification of the sphenoid sinus.  IMPRESSION: 1. No acute intracranial findings. Nonstandard positioning does limit evaluation. 2. Chronic atrophy and microvascular disease. 3. Chronic opacification of the sphenoid sinuses air cells   Electronically Signed   By: Suzy Bouchard M.D.   On: 03/31/2015 20:35   Dg Chest Portable 1 View  03/31/2015   CLINICAL DATA:  Recent discharge from hospital for shingles an urinary tract infection. Altered mental status.  EXAM: PORTABLE CHEST - 1 VIEW  COMPARISON:  03/25/2015 and 01/10/2015  FINDINGS: Patient is rotated to the left with chin/head overlying the left apex. Lungs are adequately inflated without consolidation or effusion. Small nodular opacity over the right midlung and left midlung unchanged. Mild stable cardiomegaly. Mild calcified plaque over the aortic arch. Remainder of the exam is unchanged.  IMPRESSION: No acute cardiopulmonary disease.  Stable small bilateral nodules unchanged as agree with previous recommendation of chest CT.  Mild cardiomegaly.   Electronically Signed   By: Marin Olp M.D.   On: 03/31/2015 19:30   Dg Chest Port 1 View  03/25/2015   CLINICAL DATA:  Syncopal episode  EXAM: PORTABLE CHEST - 1 VIEW  COMPARISON:  01/10/2015  FINDINGS: Examination is somewhat limited secondary to the patient's inability to adequately position herself. The cardiac shadow is mildly enlarged but stable. The lungs are well aerated bilaterally. The previously seen nodular densities are not as well appreciated on today's exam. No focal confluent infiltrate is seen.  IMPRESSION: Limited exam without acute abnormality.   Electronically Signed   By: Inez Catalina M.D.   On: 03/25/2015 15:14     Assessment/Plan   ICD-9-CM ICD-10-CM   1. Decreased functional mobility 781.99 R26.89 Ambulatory referral to Home Health  2. Edema 782.3  R60.9 Ambulatory referral to Home Health     CANCELED: DG Chest 2 View  3. ALZHEIMERS DISEASE 331.0 G30.9 Ambulatory referral to Home Health  4. FTT (failure to thrive) in adult 783.7 R62.7 Ambulatory referral to [place]  5. Dysphagia 787.20 R13.10 Ambulatory referral to [facility]  6. Urinary retention with incomplete bladder emptying - has chronic foley cath 788.21 R33.9 Ambulatory referral to [place]  7. Shortness of breath - new since hospital d/c 786.05 R06.02 CANCELED: DG Chest 2 View    --Keep legs elevated when seated  --Follow up with Dr Mariea Clonts as scheduled  --Continue current medications as ordered  --CXR ordered to eval SOB   S. Perlie Gold  [facility] [address] [place], [place] [address] [phone] Cell (Monday-Friday 8 AM - 5 PM) [phone] After 5 PM and follow prompts

## 2015-04-19 ENCOUNTER — Ambulatory Visit (INDEPENDENT_AMBULATORY_CARE_PROVIDER_SITE_OTHER): Payer: Medicare Other | Admitting: Internal Medicine

## 2015-04-19 ENCOUNTER — Ambulatory Visit (HOSPITAL_BASED_OUTPATIENT_CLINIC_OR_DEPARTMENT_OTHER)
Admission: RE | Admit: 2015-04-19 | Discharge: 2015-04-19 | Disposition: A | Discharge status: Home / Self Care | Payer: Medicare Other | Source: Ambulatory Visit | Attending: Internal Medicine | Admitting: Internal Medicine

## 2015-04-19 ENCOUNTER — Encounter: Payer: Self-pay | Admitting: Internal Medicine

## 2015-04-19 VITALS — BP 136/72 | HR 66 | Temp 97.0°F | Resp 18

## 2015-04-19 DIAGNOSIS — E43 Unspecified severe protein-calorie malnutrition: Secondary | ICD-10-CM | POA: Diagnosis present

## 2015-04-19 DIAGNOSIS — I82612 Acute embolism and thrombosis of superficial veins of left upper extremity: Secondary | ICD-10-CM

## 2015-04-19 DIAGNOSIS — R532 Functional quadriplegia: Secondary | ICD-10-CM | POA: Diagnosis present

## 2015-04-19 DIAGNOSIS — D62 Acute posthemorrhagic anemia: Secondary | ICD-10-CM | POA: Diagnosis not present

## 2015-04-19 DIAGNOSIS — R531 Weakness: Secondary | ICD-10-CM | POA: Diagnosis not present

## 2015-04-19 DIAGNOSIS — G9340 Encephalopathy, unspecified: Secondary | ICD-10-CM | POA: Diagnosis present

## 2015-04-19 DIAGNOSIS — M7989 Other specified soft tissue disorders: Secondary | ICD-10-CM

## 2015-04-19 DIAGNOSIS — Z8673 Personal history of transient ischemic attack (TIA), and cerebral infarction without residual deficits: Secondary | ICD-10-CM

## 2015-04-19 DIAGNOSIS — I10 Essential (primary) hypertension: Secondary | ICD-10-CM | POA: Diagnosis present

## 2015-04-19 DIAGNOSIS — Z681 Body mass index (BMI) 19 or less, adult: Secondary | ICD-10-CM

## 2015-04-19 DIAGNOSIS — Z87891 Personal history of nicotine dependence: Secondary | ICD-10-CM

## 2015-04-19 DIAGNOSIS — Z79899 Other long term (current) drug therapy: Secondary | ICD-10-CM

## 2015-04-19 DIAGNOSIS — G309 Alzheimer's disease, unspecified: Secondary | ICD-10-CM | POA: Diagnosis present

## 2015-04-19 DIAGNOSIS — G231 Progressive supranuclear ophthalmoplegia [Steele-Richardson-Olszewski]: Secondary | ICD-10-CM | POA: Diagnosis present

## 2015-04-19 DIAGNOSIS — R627 Adult failure to thrive: Secondary | ICD-10-CM | POA: Diagnosis not present

## 2015-04-19 DIAGNOSIS — G20 Parkinson's disease: Secondary | ICD-10-CM | POA: Diagnosis present

## 2015-04-19 DIAGNOSIS — E860 Dehydration: Secondary | ICD-10-CM | POA: Diagnosis present

## 2015-04-19 DIAGNOSIS — E785 Hyperlipidemia, unspecified: Secondary | ICD-10-CM | POA: Diagnosis present

## 2015-04-19 DIAGNOSIS — I82622 Acute embolism and thrombosis of deep veins of left upper extremity: Secondary | ICD-10-CM | POA: Diagnosis not present

## 2015-04-19 DIAGNOSIS — K921 Melena: Secondary | ICD-10-CM | POA: Diagnosis present

## 2015-04-19 DIAGNOSIS — Z87440 Personal history of urinary (tract) infections: Secondary | ICD-10-CM

## 2015-04-19 DIAGNOSIS — E876 Hypokalemia: Secondary | ICD-10-CM | POA: Diagnosis not present

## 2015-04-19 DIAGNOSIS — Z66 Do not resuscitate: Secondary | ICD-10-CM | POA: Diagnosis present

## 2015-04-19 DIAGNOSIS — Z7982 Long term (current) use of aspirin: Secondary | ICD-10-CM

## 2015-04-19 DIAGNOSIS — F0280 Dementia in other diseases classified elsewhere without behavioral disturbance: Secondary | ICD-10-CM | POA: Diagnosis present

## 2015-04-19 DIAGNOSIS — Z7401 Bed confinement status: Secondary | ICD-10-CM

## 2015-04-19 DIAGNOSIS — Z7901 Long term (current) use of anticoagulants: Secondary | ICD-10-CM

## 2015-04-19 DIAGNOSIS — E039 Hypothyroidism, unspecified: Secondary | ICD-10-CM | POA: Diagnosis present

## 2015-04-19 DIAGNOSIS — D500 Iron deficiency anemia secondary to blood loss (chronic): Secondary | ICD-10-CM | POA: Diagnosis present

## 2015-04-19 DIAGNOSIS — Z86718 Personal history of other venous thrombosis and embolism: Secondary | ICD-10-CM

## 2015-04-19 MED ORDER — RIVAROXABAN 15 MG PO TABS: 15.0000 mg | ORAL_TABLET | Freq: Two times a day (BID) | ORAL | Status: DC

## 2015-04-19 NOTE — Addendum Note (Signed)
Addended by: Gayland Curry on: 04/19/2015 05:50 PM   Modules accepted: Orders

## 2015-04-19 NOTE — Progress Notes (Signed)
Patient ID: Ricky [name], male   DOB: 01-14-1917, 81 y.o.   MRN: [id number]   Location:  [facility] [place]  DNR   Chief Complaint  Patient presents with  . Acute Visit    left arm swollen over-night, not talking, not eating,    HPI: Patient is a 81 y.o. male seen in the office today for an acute visit due to acute onset of left arm swelling.  His son, Ricky [name] notes that the right arm was a bit swollen, but the hand only became swollen overnight.  He had a PICC line in there during his hospital stay.  He also had some edema of his legs yesterday that is resolved today.    Didn't get shingles vaccine and he had a rash in three different locations felt to be shingles at [facility] Asked about zoster vaccine after that--discussed his should not be given until 6 mos after the zoster episode.    Did finally take his meds this am. Still not eating well. Not speaking now.  Did open his eyes.  Review of Systems:  Review of Systems  Constitutional: Positive for malaise/fatigue. Negative for fever and chills.  Eyes: Negative for blurred vision.  Respiratory: Negative for shortness of breath.   Cardiovascular: Negative for chest pain.  Musculoskeletal:       Left arm swelling, bruise of left elbow  Skin: Negative for itching and rash.  Neurological: Positive for weakness.  Psychiatric/Behavioral: Positive for memory loss.    Past Medical History  Diagnosis Date  . IBS (irritable bowel syndrome)   . Diverticulosis   . Cataract   . OAB (overactive bladder)   . Macular degeneration   . Alzheimer disease   . Hypertension   . Arthritis   . Osteoporosis   . Parkinsonian syndrome   . Syncope, vasovagal   . Neuromuscular disorder     parkisonian syndrome  . 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 Unspecified transient cerebral ischemia   . Unspecified urinary incontinence   . Senile dementia, uncomplicated   .  Unspecified constipation   . Hypothyroidism   . Anemia, unspecified   . Depression   . Alzheimer's disease   . Gout, unspecified   . Thoracic or lumbosacral neuritis or radiculitis, unspecified   . Progressive supranuclear palsy   . Unspecified transient cerebral ischemia   . Unspecified urinary incontinence   . Syncope and collapse   . Paralysis agitans   . Unspecified transient cerebral ischemia   . Pneumonitis due to inhalation of food or vomitus   . Cellulitis and abscess of oral soft tissues   . Screening for lipoid disorders   . Senile dementia, uncomplicated   . Unspecified constipation   . Disorder of bone and cartilage, unspecified   . Other specified acquired hypothyroidism   . Unspecified hypothyroidism   . Anemia, unspecified   . Depressive disorder, not elsewhere classified   . Alzheimer's disease   . Paralysis agitans   . Pneumonia, organism unspecified   . Pseudomonas infection in conditions classified elsewhere and of unspecified site   . Unspecified essential hypertension   . Urinary tract infection, site not specified   . Osteoarthrosis, unspecified whether generalized or localized, unspecified site   . Osteoporosis, unspecified   . Gout, unspecified   . Thoracic or lumbosacral neuritis or radiculitis, unspecified   . Stroke     Past Surgical History  Procedure Laterality Date  .  Esophagogastroduodenoscopy    . Eye surgery  2006    LEFT CATARACT    Allergies  Allergen Reactions  . Other Other (See Comments)    Dairy products- cause swallowing difficulty    Medications: Patient's Medications  New Prescriptions   No medications on file  Previous Medications   ACETAMINOPHEN (TYLENOL) 500 MG TABLET    Take 1,000 mg by mouth 3 (three) times daily.    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   BACITRACIN OINTMENT    Apply 1 application  topically 2 (two) times daily. Applies to head of penis to decrease infection from catheter.   CALCIUM CARBONATE-VIT D-MIN (CALTRATE 600+D PLUS) 600-800 MG-UNIT CHEW    Chew 1 tablet by mouth 2 (two) times daily with a meal.    CRANBERRY SOFT PO    Take 2 capsules by mouth 3 (three) times daily.    CYANOCOBALAMIN (VITAMIN B-12) 5000 MCG SUBL    Place 5,000 mcg under the tongue 2 (two) times daily.   FERROUS SULFATE 325 (65 FE) MG TABLET    Take 325 mg by mouth every other day.    GUAIFENESIN (MUCINEX) 600 MG 12 HR TABLET    Take 600 mg by mouth 2 (two) times daily as needed for cough.   HYDRALAZINE (APRESOLINE) 25 MG TABLET    Take 1/2 tablet by mouth twice daily to control blood pressure   LACTOSE FREE NUTRITION (BOOST) LIQD    Take 90 mLs by mouth 3 (three) times daily between meals.   LEVOTHYROXINE (SYNTHROID, LEVOTHROID) 25 MCG TABLET    TAKE 1 TABLET BY MOUTH DAILY   LIDOCAINE (LIDODERM) 5 %    PLACE 3 PATCHES ONTO THE SKIN DAILY. REMOVE & DISCARD PATCH WITHIN 12 HOURS OR AS DIRECTED BY MD   MELATONIN 5 MG TABS    Take 5 mg by mouth at bedtime as needed (For sleep.).   MEMANTINE (NAMENDA) 10 MG TABLET    Take 10 mg by mouth 2 (two) times daily.   MULTIPLE VITAMINS-MINERALS (ICAPS AREDS FORMULA PO)    Take 2 capsules by mouth 2 (two) times daily with a meal.    NON FORMULARY    D-Mannose, Take 2 teaspoon by mouth with fluids 2 times a day   POLYETHYLENE GLYCOL (MIRALAX / GLYCOLAX) PACKET    Take 17 g by mouth daily as needed for mild constipation.   PROBIOTIC PRODUCT (PHILLIPS COLON HEALTH) CAPS    Take 1 capsule by mouth 2 (two) times daily.   PSYLLIUM (METAMUCIL) 58.6 % PACKET    2 tablespoons two times daily   VOLTAREN 1 % GEL    Apply 2 g topically 4 (four) times daily.   Modified Medications   No medications on file  Discontinued Medications   No medications on file    Physical Exam: Filed Vitals:   04/19/15 1550  BP: 136/72  Pulse: 66  Temp: 97 F (36.1 C)  TempSrc: Oral    Resp: 18  SpO2: 97%   Physical Exam  Constitutional:  Frail black male seated in wheelchair, nad  Cardiovascular: Normal rate, regular rhythm and normal heart sounds.   Pulmonary/Chest: Effort normal and breath sounds normal.  Abdominal: Soft. Bowel sounds are normal.  Neurological:  Is awake, but does not speak today even when asked questions  Skin: Skin is warm and dry.  Large ecchymoses of left elbow; overt edema and tightness of left arm, no signs this is tender to him    Labs reviewed: Basic Metabolic Panel:  Recent Labs  06/28/14 1210  01/11/15 0530 01/11/15 0710  03/25/15 1720  04/06/15 0400 04/08/15 0355 04/09/15 0423  NA 139  < > 129*  --   < >  --   < > 138 139 139  K 5.1  < > 4.2  --   < >  --   < > 3.7 3.2* 4.1  CL 100  < > 97  --   < >  --   < > 107 106 107  CO2 26  < > 25  --   < >  --   < > 25 25 24   GLUCOSE 115*  < > 84  --   < >  --   < > 101* 83 82  BUN 18  < > 11  --   < >  --   < > 18 15 12   CREATININE 1.05  < > 0.67  --   < >  --   < > 1.42* 0.73 0.66  CALCIUM 9.7  < > 8.4  --   < >  --   < > 8.8* 8.4* 8.5*  MG  --   --  1.9  --   --   --   --   --   --   --   PHOS  --   --  3.2  --   --   --   --   --   --   --   TSH 2.240  --   --  0.842  --  1.228  --   --   --   --   < > = values in this interval not displayed. Liver Function Tests:  Recent Labs  06/28/14 1210 01/11/15 0530 03/31/15 2020  AST 36 26 31  ALT 19 12 19   ALKPHOS 82 55 65  BILITOT 0.6 0.7 0.6  PROT 8.2 6.3 7.5  ALBUMIN 3.5 3.3* 3.5   No results for input(s): LIPASE, AMYLASE in the last 8760 hours. No results for input(s): AMMONIA in the last 8760 hours. CBC:  Recent Labs  01/10/15 1609  03/25/15 1520  03/31/15 2020  04/02/15 0449 04/03/15 0812 04/04/15 0435  WBC 4.9  < > 7.7  < > 6.7  < > 5.2 4.7 4.5  NEUTROABS 3.1  --  6.2  --  5.4  --   --   --   --   HGB 10.4*  < > 13.0  < > 12.3*  < > 10.9* 12.1* 11.0*  HCT 31.8*  < > 40.9  < > 38.1*  < > 32.9* 37.0* 34.2*   MCV 88.8  < > 89.5  < > 89.9  < > 89.4 87.9 88.6  PLT 178  < > 213  < > 212  < > 199 220 207  < > = values in this interval not displayed.  Lab Results  Component Value Date   HGBA1C 5.3 06/21/2012    Assessment/Plan 1. Left arm swelling -suspect DVT from PICC line in the hospital and trauma to his left arm -discussed he could be treated with xarelto if he has a DVT for 3-6 mos -discussed no treatment in favor of his comfort and not further prolonging life, but his children did not accept this idea - UE VENOUS DUPLEX; Future -  had unremarkable cxr yesterday and has no signs of shortness of breath today  2. FTT (failure to thrive) in adult -his dementia related to #3 continues to progress -he is now barely verbal at all  3. Progressive supranuclear ophthalmoplegia or palsy - is progressing -his family has not been able to agree upon avoiding hospitalizations  -palliative care team met with them at the hospital  Labs/tests ordered:  Venous doppler of left UE ordered, need call report to determine therapy tonight  Next appt:  3 mos  Adella Manolis L. Rieley Hausman, D.O. [facility] [address], [place] [address] Cell Phone (Mon-Fri 8am-5pm):  [phone] On Call:  [phone] & follow prompts after 5pm & weekends Office Phone:  [phone] Office Fax:  [phone]

## 2015-04-19 NOTE — Progress Notes (Addendum)
*  PRELIMINARY RESULTS* Vascular Ultrasound Left upper extremity venous duplex has been completed.  Preliminary findings: DVT noted in the left ulnar veins at the origin [facility] fossa) and superficial thrombosis is noted in the left basilic vein, mid upper arm to [facility] fossa.  Called results to Dr. Mariea Clonts. Patient and daughter instructed to pick up Xarelto at pharmacy.   Landry Mellow, RDMS, RVT  04/19/2015, 5:29 PM

## 2015-04-19 NOTE — Progress Notes (Signed)
Patient ID: Ricky [name], male   DOB: 05-29-1917, 81 y.o.   MRN: [id number] Received call report from [facility] vascular lab:  Pt has DVT in ulnar vein at antecubital fossa and superficial phlebitis, as well.  Family has already been informed.  I asked the tech to relay the message that I will fax in a prescription for xarelto for him to take for 3 to 6 months for the DVT of his arm from the PICC line.  He is not a good coumadin candidate due to his frail state, fall risk, and inability to easily come in for labs.  His goals are at least somewhat palliative.    Begin xarelto 15mg  po bid for 21 days, then 20mg  daily for 9 more weeks for a total of 3 months of treatment.

## 2015-04-22 ENCOUNTER — Emergency Department (HOSPITAL_COMMUNITY): Admission: EM | Admit: 2015-04-22 | Discharge: 2015-04-22 | Discharge status: Absent without leave | Payer: Self-pay

## 2015-04-22 ENCOUNTER — Inpatient Hospital Stay (HOSPITAL_COMMUNITY)
Admission: EM | Admit: 2015-04-22 | Discharge: 2015-04-25 | DRG: 811 | Disposition: A | Discharge status: Home / Self Care | Payer: Medicare Other | Attending: Internal Medicine | Admitting: Internal Medicine

## 2015-04-22 ENCOUNTER — Encounter (HOSPITAL_COMMUNITY): Payer: Self-pay | Admitting: Emergency Medicine

## 2015-04-22 ENCOUNTER — Emergency Department (HOSPITAL_COMMUNITY): Payer: Medicare Other

## 2015-04-22 ENCOUNTER — Observation Stay (HOSPITAL_COMMUNITY): Payer: Medicare Other

## 2015-04-22 DIAGNOSIS — Z681 Body mass index (BMI) 19 or less, adult: Secondary | ICD-10-CM | POA: Diagnosis not present

## 2015-04-22 DIAGNOSIS — Z7401 Bed confinement status: Secondary | ICD-10-CM | POA: Diagnosis not present

## 2015-04-22 DIAGNOSIS — I10 Essential (primary) hypertension: Secondary | ICD-10-CM | POA: Diagnosis present

## 2015-04-22 DIAGNOSIS — Z87440 Personal history of urinary (tract) infections: Secondary | ICD-10-CM | POA: Diagnosis not present

## 2015-04-22 DIAGNOSIS — E785 Hyperlipidemia, unspecified: Secondary | ICD-10-CM | POA: Diagnosis present

## 2015-04-22 DIAGNOSIS — F0390 Unspecified dementia without behavioral disturbance: Secondary | ICD-10-CM | POA: Diagnosis present

## 2015-04-22 DIAGNOSIS — D509 Iron deficiency anemia, unspecified: Secondary | ICD-10-CM | POA: Diagnosis present

## 2015-04-22 DIAGNOSIS — Z79899 Other long term (current) drug therapy: Secondary | ICD-10-CM | POA: Diagnosis not present

## 2015-04-22 DIAGNOSIS — F0280 Dementia in other diseases classified elsewhere without behavioral disturbance: Secondary | ICD-10-CM | POA: Diagnosis present

## 2015-04-22 DIAGNOSIS — I82629 Acute embolism and thrombosis of deep veins of unspecified upper extremity: Secondary | ICD-10-CM | POA: Insufficient documentation

## 2015-04-22 DIAGNOSIS — R532 Functional quadriplegia: Secondary | ICD-10-CM | POA: Diagnosis present

## 2015-04-22 DIAGNOSIS — I82622 Acute embolism and thrombosis of deep veins of left upper extremity: Secondary | ICD-10-CM | POA: Diagnosis not present

## 2015-04-22 DIAGNOSIS — R531 Weakness: Secondary | ICD-10-CM | POA: Diagnosis present

## 2015-04-22 DIAGNOSIS — E43 Unspecified severe protein-calorie malnutrition: Secondary | ICD-10-CM | POA: Diagnosis present

## 2015-04-22 DIAGNOSIS — G9340 Encephalopathy, unspecified: Secondary | ICD-10-CM | POA: Diagnosis not present

## 2015-04-22 DIAGNOSIS — M7989 Other specified soft tissue disorders: Secondary | ICD-10-CM

## 2015-04-22 DIAGNOSIS — D500 Iron deficiency anemia secondary to blood loss (chronic): Secondary | ICD-10-CM | POA: Diagnosis present

## 2015-04-22 DIAGNOSIS — Z66 Do not resuscitate: Secondary | ICD-10-CM | POA: Diagnosis present

## 2015-04-22 DIAGNOSIS — K922 Gastrointestinal hemorrhage, unspecified: Secondary | ICD-10-CM

## 2015-04-22 DIAGNOSIS — D649 Anemia, unspecified: Secondary | ICD-10-CM

## 2015-04-22 DIAGNOSIS — G231 Progressive supranuclear ophthalmoplegia [Steele-Richardson-Olszewski]: Secondary | ICD-10-CM | POA: Diagnosis present

## 2015-04-22 DIAGNOSIS — E876 Hypokalemia: Secondary | ICD-10-CM | POA: Diagnosis not present

## 2015-04-22 DIAGNOSIS — D62 Acute posthemorrhagic anemia: Secondary | ICD-10-CM | POA: Diagnosis present

## 2015-04-22 DIAGNOSIS — R627 Adult failure to thrive: Secondary | ICD-10-CM | POA: Diagnosis present

## 2015-04-22 DIAGNOSIS — Z86718 Personal history of other venous thrombosis and embolism: Secondary | ICD-10-CM | POA: Diagnosis not present

## 2015-04-22 DIAGNOSIS — G20 Parkinson's disease: Secondary | ICD-10-CM | POA: Diagnosis present

## 2015-04-22 DIAGNOSIS — K921 Melena: Secondary | ICD-10-CM | POA: Diagnosis present

## 2015-04-22 DIAGNOSIS — Z8673 Personal history of transient ischemic attack (TIA), and cerebral infarction without residual deficits: Secondary | ICD-10-CM | POA: Diagnosis not present

## 2015-04-22 DIAGNOSIS — E860 Dehydration: Secondary | ICD-10-CM

## 2015-04-22 DIAGNOSIS — G309 Alzheimer's disease, unspecified: Secondary | ICD-10-CM | POA: Diagnosis present

## 2015-04-22 DIAGNOSIS — Z7982 Long term (current) use of aspirin: Secondary | ICD-10-CM | POA: Diagnosis not present

## 2015-04-22 DIAGNOSIS — Z87891 Personal history of nicotine dependence: Secondary | ICD-10-CM | POA: Diagnosis not present

## 2015-04-22 DIAGNOSIS — Z7901 Long term (current) use of anticoagulants: Secondary | ICD-10-CM | POA: Diagnosis not present

## 2015-04-22 DIAGNOSIS — E039 Hypothyroidism, unspecified: Secondary | ICD-10-CM | POA: Diagnosis present

## 2015-04-22 LAB — URINALYSIS, ROUTINE W REFLEX MICROSCOPIC
BILIRUBIN URINE: NEGATIVE
Glucose, UA: NEGATIVE mg/dL
KETONES UR: NEGATIVE mg/dL
Nitrite: NEGATIVE
PH: 7.5 (ref 5.0–8.0)
Protein, ur: NEGATIVE mg/dL
SPECIFIC GRAVITY, URINE: 1.003 — AB (ref 1.005–1.030)
UROBILINOGEN UA: 0.2 mg/dL (ref 0.0–1.0)

## 2015-04-22 LAB — URINE MICROSCOPIC-ADD ON

## 2015-04-22 LAB — BASIC METABOLIC PANEL
ANION GAP: 8 (ref 5–15)
BUN: 16 mg/dL (ref 6–20)
CO2: 29 mmol/L (ref 22–32)
Calcium: 8.9 mg/dL (ref 8.9–10.3)
Chloride: 97 mmol/L — ABNORMAL LOW (ref 101–111)
Creatinine, Ser: 0.72 mg/dL (ref 0.61–1.24)
GFR calc non Af Amer: >60 mL/min (ref >60)
GLUCOSE: 96 mg/dL (ref 65–99)
Potassium: 4.2 mmol/L (ref 3.5–5.1)
SODIUM: 134 mmol/L — AB (ref 135–145)

## 2015-04-22 LAB — CBC WITH DIFFERENTIAL/PLATELET
BASOS PCT: 0 % (ref 0–1)
Basophils Absolute: 0 10*3/uL (ref 0.0–0.1)
Eosinophils Absolute: 0.1 10*3/uL (ref 0.0–0.7)
Eosinophils Relative: 1 % (ref 0–5)
HEMATOCRIT: 20.1 % — AB (ref 39.0–52.0)
HEMOGLOBIN: 6.5 g/dL — AB (ref 13.0–17.0)
Lymphocytes Relative: 32 % (ref 12–46)
Lymphs Abs: 1.9 10*3/uL (ref 0.7–4.0)
MCH: 29.4 pg (ref 26.0–34.0)
MCHC: 32.3 g/dL (ref 30.0–36.0)
MCV: 91 fL (ref 78.0–100.0)
Monocytes Absolute: 0.6 10*3/uL (ref 0.1–1.0)
Monocytes Relative: 10 % (ref 3–12)
NEUTROS ABS: 3.4 10*3/uL (ref 1.7–7.7)
Neutrophils Relative %: 58 % (ref 43–77)
Platelets: 229 10*3/uL (ref 150–400)
RBC: 2.21 MIL/uL — AB (ref 4.22–5.81)
RDW: 15.7 % — ABNORMAL HIGH (ref 11.5–15.5)
WBC: 5.9 10*3/uL (ref 4.0–10.5)

## 2015-04-22 LAB — RETICULOCYTES
RBC.: 2.06 MIL/uL — ABNORMAL LOW (ref 4.22–5.81)
RETIC CT PCT: 5.8 % — AB (ref 0.4–3.1)
Retic Count, Absolute: 119.5 10*3/uL (ref 19.0–186.0)

## 2015-04-22 LAB — HEMOGLOBIN AND HEMATOCRIT, BLOOD
HEMATOCRIT: 18.3 % — AB (ref 39.0–52.0)
Hemoglobin: 5.9 g/dL — CL (ref 13.0–17.0)

## 2015-04-22 LAB — FERRITIN: Ferritin: 210 ng/mL (ref 24–336)

## 2015-04-22 LAB — VITAMIN B12: VITAMIN B 12: 4823 pg/mL — AB (ref 180–914)

## 2015-04-22 LAB — IRON AND TIBC
Iron: 32 ug/dL — ABNORMAL LOW (ref 45–182)
Saturation Ratios: 15 % — ABNORMAL LOW (ref 17.9–39.5)
TIBC: 209 ug/dL — ABNORMAL LOW (ref 250–450)
UIBC: 177 ug/dL

## 2015-04-22 LAB — POC OCCULT BLOOD, ED: FECAL OCCULT BLD: NEGATIVE

## 2015-04-22 LAB — PREPARE RBC (CROSSMATCH)

## 2015-04-22 LAB — FOLATE: Folate: 18.5 ng/mL (ref >5.9)

## 2015-04-22 MED ORDER — DIPHENHYDRAMINE HCL 25 MG PO CAPS
25.0000 mg | ORAL_CAPSULE | Freq: Once | ORAL | Status: AC
  Administered 2015-04-22: 25 mg via ORAL
  Filled 2015-04-22: qty 1

## 2015-04-22 MED ORDER — SODIUM CHLORIDE 0.9 % IV SOLN
Freq: Once | INTRAVENOUS | Status: AC
  Administered 2015-04-23: via INTRAVENOUS

## 2015-04-22 MED ORDER — ACETAMINOPHEN 325 MG PO TABS
650.0000 mg | ORAL_TABLET | Freq: Once | ORAL | Status: AC
  Administered 2015-04-22: 650 mg via ORAL
  Filled 2015-04-22: qty 2

## 2015-04-22 MED ORDER — SODIUM CHLORIDE 0.9 % IV BOLUS (SEPSIS)
1000.0000 mL | Freq: Once | INTRAVENOUS | Status: AC
  Administered 2015-04-22: 1000 mL via INTRAVENOUS

## 2015-04-22 NOTE — ED Notes (Addendum)
Per family, pt spent most of May in [facility] hospital due to UTI. Since being discharged pt began to have swelling in L arm and hand. Pt was taken to PCP who placed pt on Xarelto due to blood clots in his L arm. Since starting Xarelto on Friday, pt has had increased swelling to L hand. L hand has significant swelling. Per family, pt also has had a fever of 99.8 . Pt's temperature is 98.0. Per family, pt has no complaints of SOB, chest pain.

## 2015-04-22 NOTE — ED Notes (Signed)
PA at bedside.

## 2015-04-22 NOTE — ED Provider Notes (Signed)
CSN: [id number]     Arrival date & time 04/22/15  1323 History   First MD Initiated Contact with Patient 04/22/15 1459     Chief Complaint  Patient presents with  . Arm swelling   . Fever   Level V Caveat: Dementia   Ricky [name] is a 81 y.o. male with a history of multiple medical problems including advanced dementia, indwelling foley cath with frequent UTIs and frequent recent admissions for dehydration and UTIs. He presents to the hospital with his son who reports over the last two days he has had decreased urine output, and has had a change in his mental status. He reports he has been talking less and unable to hold a cup to drink out of. He also reports he had a fever of 99.8 last night. He was recently diagnosed with a DVT of his left ulnar vein on 04/19/15 and was started on Xarelto 3 days ago. The son is concerned that the swelling has not gone down and reports increased bruising to the dorsal aspect of his left hand. He reports the swelling of his arm is about the same since the diagnosis of the DVT. The son reports has been urinating about 350 ml urine every 12 hours when he usually urinates about 1500 every 12 hours. He works that his Foley was last changed 04/11/2015. The son reports he believed he aspirated a small amount of water earlier today. He denies any coughing, difficulty breathing, vomiting, rashes, changes to the smell of his urine, hematuria.   (Consider location/radiation/quality/duration/timing/severity/associated sxs/prior Treatment) HPI  Past Medical History  Diagnosis Date  . IBS (irritable bowel syndrome)   . Diverticulosis   . Cataract   . OAB (overactive bladder)   . Macular degeneration   . Alzheimer disease   . Hypertension   . Arthritis   . Osteoporosis   . Parkinsonian syndrome   . Syncope, vasovagal   . Neuromuscular disorder     parkisonian syndrome  . 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 Unspecified transient  cerebral ischemia   . Unspecified urinary incontinence   . Senile dementia, uncomplicated   . Unspecified constipation   . Hypothyroidism   . Anemia, unspecified   . Depression   . Alzheimer's disease   . Gout, unspecified   . Thoracic or lumbosacral neuritis or radiculitis, unspecified   . Progressive supranuclear palsy   . Unspecified transient cerebral ischemia   . Unspecified urinary incontinence   . Syncope and collapse   . Paralysis agitans   . Unspecified transient cerebral ischemia   . Pneumonitis due to inhalation of food or vomitus   . Cellulitis and abscess of oral soft tissues   . Screening for lipoid disorders   . Senile dementia, uncomplicated   . Unspecified constipation   . Disorder of bone and cartilage, unspecified   . Other specified acquired hypothyroidism   . Unspecified hypothyroidism   . Anemia, unspecified   . Depressive disorder, not elsewhere classified   . Alzheimer's disease   . Paralysis agitans   . Pneumonia, organism unspecified   . Pseudomonas infection in conditions classified elsewhere and of unspecified site   . Unspecified essential hypertension   . Urinary tract infection, site not specified   . Osteoarthrosis, unspecified whether generalized or localized, unspecified site   . Osteoporosis, unspecified   . Gout, unspecified   . Thoracic or lumbosacral neuritis or radiculitis, unspecified   . Stroke  Past Surgical History  Procedure Laterality Date  . Esophagogastroduodenoscopy    . Eye surgery  2006    LEFT CATARACT   Family History  Problem Relation Age of Onset  . Cancer Mother   . Stroke Father   . Liver disease Father   . Kidney disease Brother   . Liver disease Brother   . Emphysema Brother    History  Substance Use Topics  . Smoking status: Former Research scientist (life sciences)  . Smokeless tobacco: Never Used     Comment: QUIT SMOKING BACK IN THE LATE 50'S  . Alcohol Use: No    Review of Systems  Unable to perform ROS: Dementia   Constitutional: Positive for fever.  Respiratory: Negative for cough.   Gastrointestinal: Negative for vomiting and diarrhea.  Genitourinary: Positive for decreased urine volume. Negative for hematuria.      Allergies  Other  Home Medications   Prior to Admission medications   Medication Sig Start Date End Date Taking? Authorizing Provider  acetaminophen (TYLENOL) 500 MG tablet Take 1,000 mg by mouth 3 (three) times daily.    Yes Historical Provider, MD  aspirin EC 325 MG tablet Take 325 mg by mouth every evening.   Yes Historical Provider, MD  bacitracin ointment Apply 1 application topically 2 (two) times daily. Applies to head of penis to decrease infection from catheter.   Yes Historical Provider, MD  Calcium Carbonate-Vit D-Min (CALTRATE 600+D PLUS) 600-800 MG-UNIT CHEW Chew 1 tablet by mouth 2 (two) times daily with a meal.    Yes Historical Provider, MD  CRANBERRY SOFT PO Take 2 capsules by mouth 3 (three) times daily.    Yes Historical Provider, MD  Cyanocobalamin (VITAMIN B-12) 5000 MCG SUBL Place 5,000 mcg under the tongue 2 (two) times daily.   Yes Historical Provider, MD  ferrous sulfate 325 (65 FE) MG tablet Take 325 mg by mouth every other day.    Yes Historical Provider, MD  guaiFENesin (MUCINEX) 600 MG 12 hr tablet Take 600 mg by mouth 2 (two) times daily as needed for cough.   Yes Historical Provider, MD  hydrALAZINE (APRESOLINE) 25 MG tablet Take 1/2 tablet by mouth twice daily to control blood pressure 11/14/14  Yes Mahima Pandey, MD  lactose free nutrition (BOOST) LIQD Take 90 mLs by mouth 3 (three) times daily between meals. Patient taking differently: Take 90 mLs by mouth 2 (two) times daily between meals.  07/01/14  Yes Barton Dubois, MD  levothyroxine (SYNTHROID, LEVOTHROID) 25 MCG tablet TAKE 1 TABLET BY MOUTH DAILY 10/19/14  Yes Tiffany L Reed, DO  lidocaine (LIDODERM) 5 % PLACE 3 PATCHES ONTO THE SKIN DAILY. REMOVE & DISCARD PATCH WITHIN 12 HOURS OR AS DIRECTED  BY MD Patient taking differently: PLACE 1-3 PATCHES as needed for pain place ONTO THE SKIN DAILY. REMOVE & DISCARD PATCH WITHIN 12 HOURS OR AS DIRECTED BY MD 02/16/15  Yes Tiffany L Reed, DO  Melatonin 5 MG TABS Take 5 mg by mouth at bedtime as needed (For sleep.).   Yes Historical Provider, MD  memantine (NAMENDA) 10 MG tablet Take 10 mg by mouth 2 (two) times daily.   Yes Historical Provider, MD  Multiple Vitamins-Minerals (ICAPS AREDS FORMULA PO) Take 2 capsules by mouth 2 (two) times daily with a meal.    Yes Historical Provider, MD  NON FORMULARY D-Mannose, Take 2 teaspoon by mouth with fluids 2 times a day   Yes Historical Provider, MD  polyethylene glycol (MIRALAX / GLYCOLAX) packet Take 17  g by mouth daily as needed for mild constipation. 07/01/14  Yes Barton Dubois, MD  Probiotic Product (Toluca) CAPS Take 1 capsule by mouth 2 (two) times daily.   Yes Historical Provider, MD  psyllium (METAMUCIL) 58.6 % packet 2 tablespoons two times daily   Yes Historical Provider, MD  Rivaroxaban (XARELTO) 15 MG TABS tablet Take 1 tablet (15 mg total) by mouth 2 (two) times daily with a meal. 04/19/15 05/11/15 Yes Tiffany L Reed, DO  VOLTAREN 1 % GEL Apply 2 g topically 4 (four) times daily.  07/10/14  Yes Historical Provider, MD  AMBULATORY NON FORMULARY MEDICATION Pressure Air Mattress Dx: G60.8, [id number] Patient not taking: Reported on 04/22/2015 10/30/14   Tiffany L Reed, DO   BP 144/78 mmHg  Pulse 70  Temp(Src) 97.5 F (36.4 C) (Rectal)  Resp 18  SpO2 96% Physical Exam  Constitutional: He appears well-developed and well-nourished. No distress.  Nontoxic appearing.  HENT:  Head: Normocephalic and atraumatic.  Mouth/Throat: Oropharynx is clear and moist.  Eyes: Conjunctivae are normal. Pupils are equal, round, and reactive to light. Right eye exhibits no discharge. Left eye exhibits no discharge.  Neck: Neck supple.  Cardiovascular: Normal rate, regular rhythm, normal heart sounds and  intact distal pulses.  Exam reveals no gallop and no friction rub.   bilateral radial pulses are intact.   Pulmonary/Chest: Effort normal and breath sounds normal. No respiratory distress. He has no wheezes. He has no rales.  Lungs are clear to auscultation bilaterally.  Abdominal: Soft. Bowel sounds are normal. He exhibits no distension. There is no tenderness.  Genitourinary: Penis normal. Guaiac negative stool.  Foley cath in place. No leakage. No penile discharge or GU rashes.  Digital rectal exam preformed by me with male RN as chaperone. No gross bloody stool.   Musculoskeletal: He exhibits edema.  Left arm edema from his shoulder to his hand. Ecchymosis noted to the dorsal aspect of his left hand. Bilateral radial pulses are intact.   Lymphadenopathy:    He has no cervical adenopathy.  Neurological: He is alert.  The patient is alert but does not respond to any questions or speak at all.   Skin: Skin is warm and dry. No rash noted. He is not diaphoretic. No erythema. No pallor.  Psychiatric:  Patient is pleasantly alert but does not speak.   Nursing note and vitals reviewed.   ED Course  Procedures (including critical care time) Labs Review Labs Reviewed  BASIC METABOLIC PANEL - Abnormal; Notable for the following:    Sodium 134 (*)    Chloride 97 (*)    All other components within normal limits  CBC WITH DIFFERENTIAL/PLATELET - Abnormal; Notable for the following:    RBC 2.21 (*)    Hemoglobin 6.5 (*)    HCT 20.1 (*)    RDW 15.7 (*)    All other components within normal limits  URINALYSIS, ROUTINE W REFLEX MICROSCOPIC (NOT AT [facility]) - Abnormal; Notable for the following:    Specific Gravity, Urine 1.003 (*)    Hgb urine dipstick SMALL (*)    Leukocytes, UA SMALL (*)    All other components within normal limits  VITAMIN B12 - Abnormal; Notable for the following:    Vitamin B-12 4823 (*)    All other components within normal limits  IRON AND TIBC - Abnormal; Notable  for the following:    Iron 32 (*)    TIBC 209 (*)    Saturation Ratios 15 (*)  All other components within normal limits  RETICULOCYTES - Abnormal; Notable for the following:    Retic Ct Pct 5.8 (*)    RBC. 2.06 (*)    All other components within normal limits  HEMOGLOBIN AND HEMATOCRIT, BLOOD - Abnormal; Notable for the following:    Hemoglobin 5.9 (*)    HCT 18.3 (*)    All other components within normal limits  URINE CULTURE  FOLATE  FERRITIN  URINE MICROSCOPIC-ADD ON  OCCULT BLOOD X 1 CARD TO LAB, STOOL  POC OCCULT BLOOD, ED  TYPE AND SCREEN    Imaging Review Dg Chest 2 View  04/22/2015   CLINICAL DATA:  Fever.  EXAM: CHEST  2 VIEW  COMPARISON:  04/18/2015  FINDINGS: Dissemination is quite limited due to the position of the patient has arms which are obscuring most of the chest. The heart is enlarged. There is tortuosity and calcification of the thoracic aorta. The lungs are grossly clear.  IMPRESSION: Very limited examination.  No obvious/gross acute pulmonary process   Electronically Signed   By: Marijo Sanes M.D.   On: 04/22/2015 16:58     EKG Interpretation None      Filed Vitals:   04/22/15 1614 04/22/15 1720 04/22/15 1720 04/22/15 1944  BP:   142/71 144/78  Pulse: 69  61 70  Temp:  97.5 F (36.4 C)    TempSrc:  Rectal    Resp: 16  16 18   SpO2: 97%  97% 96%     MDM   Meds given in ED:  Medications  sodium chloride 0.9 % bolus 1,000 mL (0 mLs Intravenous Stopped 04/22/15 1943)    New Prescriptions   No medications on file    Final diagnoses:  Anemia, unspecified anemia type  Dehydration    This is a 81 y.o. male with a history of multiple medical problems including advanced dementia, indwelling foley cath with frequent UTIs and frequent recent admissions for dehydration and UTIs. He presents to the hospital with his son who reports over the last two days he has had decreased urine output, and has had a change in his mental status. He reports he has  been talking less and unable to hold a cup to drink out of. He also reports he had a fever of 99.8 last night. He was recently diagnosed with a DVT of his left ulnar vein on 04/19/15 and was started on Xarelto 3 days ago. The son is concerned that the swelling has not gone down and reports increased bruising to the dorsal aspect of his left hand. He reports the swelling of his arm is about the same since the diagnosis of the DVT.  On exam patient is afebrile and nontoxic appearing. He is alert but not responding to any questions. His abdomen is soft nontender palpation. His Foley catheter is in place with no discharge or erythema. No gross bloody stool on digital rectal exam. Patient's hemoglobin returned at 6.5 when it was 11.0 last done 1 month ago. Concern for bleed after starting Xarelto. After the patient received 1 L fluid bolus the patient had a change in his mental status and became more alert. He told me he was not having any pain anywhere and was more alert and will look at you when speaking. The son states this is the best he has looked in more than a week. His urinalysis showed small leukocytes with 11-20 white blood cells. The urine was sent for culture. Will not treat for urinary  tract infection at this time. I discussed getting the patient a blood transfusion but the family would like to wait for now until his daughter arrives to decided about blood transfusion. The patient's blood pressures have been good with his last 144/78. Will hold off on blood transfusion at this time. Consulted with admission with Dr. Mariann Barter who accepted the patient for admission. The patient's family is in agreement with admission.     Waynetta Pean, PA-C 04/22/15 2104  Veryl Speak, MD 04/22/15 2255

## 2015-04-22 NOTE — ED Notes (Signed)
Pt denies pain however, family requests that we still administer administer tylenol.

## 2015-04-22 NOTE — ED Notes (Signed)
Pts daughter and guardian is [name] she would like to be contacted when he is roomed at [facility] whatever time it is Home [phone] Cell [phone]

## 2015-04-22 NOTE — ED Notes (Signed)
Pt denies pain currently. 

## 2015-04-22 NOTE — ED Notes (Signed)
Tylenol given crushed with apple sauce per family request

## 2015-04-22 NOTE — ED Notes (Signed)
One unsuccessful attempt at second IV site establishment. Charge RN Autumn to attempt. Pt tolerated well.

## 2015-04-22 NOTE — H&P (Addendum)
PCP:  Hollace Kinnier, DO  [facility]  Referring provider Will PA   Chief Complaint:  confusion  HPI: Ricky [name] is a 81 y.o. male   has a past medical history of IBS (irritable bowel syndrome); Diverticulosis; Cataract; OAB (overactive bladder); Macular degeneration; Alzheimer disease; Hypertension; Arthritis; Osteoporosis; Parkinsonian syndrome; Syncope, vasovagal; Neuromuscular disorder; Dementia due to Parkinson's disease without behavioral disturbance; Unspecified transient cerebral ischemia; Unspecified urinary incontinence; Senile dementia, uncomplicated; Unspecified constipation; Hypothyroidism; Anemia, unspecified; Depression; Alzheimer's disease; Gout, unspecified; Thoracic or lumbosacral neuritis or radiculitis, unspecified; Progressive supranuclear palsy; Unspecified transient cerebral ischemia; Unspecified urinary incontinence; Syncope and collapse; Paralysis agitans; Unspecified transient cerebral ischemia; Pneumonitis due to inhalation of food or vomitus; Cellulitis and abscess of oral soft tissues; Screening for lipoid disorders; Senile dementia, uncomplicated; Unspecified constipation; Disorder of bone and cartilage, unspecified; Other specified acquired hypothyroidism; Unspecified hypothyroidism; Anemia, unspecified; Depressive disorder, not elsewhere classified; Alzheimer's disease; Paralysis agitans; Pneumonia, organism unspecified; Pseudomonas infection in conditions classified elsewhere and of unspecified site; Unspecified essential hypertension; Urinary tract infection, site not specified; Osteoarthrosis, unspecified whether generalized or localized, unspecified site; Osteoporosis, unspecified; Gout, unspecified; Thoracic or lumbosacral neuritis or radiculitis, unspecified; and Stroke.   Presented with  Patient have had recent extensive hospitalization due to urinary tract infection hasn't L and Foley catheter. He required a PICC line placement in the left arm. After  discharge was found to have left arm DVT and started on Xarelto on 63. Per family noted to have low grade fever up to 99 An somewhat worse confusion. Patient has history significant dementia at baseline. Lives at home with a dotting son.  Son reports decreased urine output in the past few days reports the patient unable to hold cuff to drink from a second generalized weakness.  Emergency department patient was found to be significantly anemic down to 6.5 with negative Hemoccult. Upon further discussion with family they report an episode of large tarry stool on Thursday night 04/19/15 this was only one episode.  Family did not endorse any history of blood per rectum. Prior hemoglobin was 11 on May 18.  Emergency Department UA showed 11-20 white blood cells negative nitrites no significant bacteria noted. Given indwelling catheter for to be likely contaminant or  colonization . Of note urine culture from 8th of May did show pseudomonas pansensitive patient was treated with cefepime.  In the emergency department patient was given IV fluids and thereafter improved became more alert.  Given hx of melena Gi consult has been called. Dr. Olevia Perches is aware and rec keeping patietn NPO for possible Endoscopy in AM.  Hospitalist was called for admission for dehydration and anemia  Review of Systems:    Pertinent positives include:  Fevers, chills, melena  Constitutional:  No weight loss, night sweats, fatigue, weight loss  HEENT:  No headaches, Difficulty swallowing,Tooth/dental problems,Sore throat,  No sneezing, itching, ear ache, nasal congestion, post nasal drip,  Cardio-vascular:  No chest pain, Orthopnea, PND, anasarca, dizziness, palpitations.no Bilateral lower extremity swelling  GI:  No heartburn, indigestion, abdominal pain, nausea, vomiting, diarrhea, change in bowel habits, loss of appetite, melena, blood in stool, hematemesis Resp:  no shortness of breath at rest. No dyspnea on exertion, No  excess mucus, no productive cough, No non-productive cough, No coughing up of blood.No change in color of mucus.No wheezing. Skin:  no rash or lesions. No jaundice GU:  no dysuria, change in color of urine, no urgency or frequency. No straining to urinate.  No flank pain.  Musculoskeletal:  No joint pain or no joint swelling. No decreased range of motion. No back pain.  Psych:  No change in mood or affect. No depression or anxiety. No memory loss.  Neuro: no localizing neurological complaints, no tingling, no weakness, no double vision, no gait abnormality, no slurred speech, no confusion  Otherwise ROS are negative except for above, 10 systems were reviewed  Past Medical History: Past Medical History  Diagnosis Date  . IBS (irritable bowel syndrome)   . Diverticulosis   . Cataract   . OAB (overactive bladder)   . Macular degeneration   . Alzheimer disease   . Hypertension   . Arthritis   . Osteoporosis   . Parkinsonian syndrome   . Syncope, vasovagal   . Neuromuscular disorder     parkisonian syndrome  . 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 Unspecified transient cerebral ischemia   . Unspecified urinary incontinence   . Senile dementia, uncomplicated   . Unspecified constipation   . Hypothyroidism   . Anemia, unspecified   . Depression   . Alzheimer's disease   . Gout, unspecified   . Thoracic or lumbosacral neuritis or radiculitis, unspecified   . Progressive supranuclear palsy   . Unspecified transient cerebral ischemia   . Unspecified urinary incontinence   . Syncope and collapse   . Paralysis agitans   . Unspecified transient cerebral ischemia   . Pneumonitis due to inhalation of food or vomitus   . Cellulitis and abscess of oral soft tissues   . Screening for lipoid disorders   . Senile dementia, uncomplicated   . Unspecified constipation   . Disorder of bone and cartilage, unspecified   . Other specified acquired  hypothyroidism   . Unspecified hypothyroidism   . Anemia, unspecified   . Depressive disorder, not elsewhere classified   . Alzheimer's disease   . Paralysis agitans   . Pneumonia, organism unspecified   . Pseudomonas infection in conditions classified elsewhere and of unspecified site   . Unspecified essential hypertension   . Urinary tract infection, site not specified   . Osteoarthrosis, unspecified whether generalized or localized, unspecified site   . Osteoporosis, unspecified   . Gout, unspecified   . Thoracic or lumbosacral neuritis or radiculitis, unspecified   . Stroke    Past Surgical History  Procedure Laterality Date  . Esophagogastroduodenoscopy    . Eye surgery  2006    LEFT CATARACT     Medications: Prior to Admission medications   Medication Sig Start Date End Date Taking? Authorizing Provider  acetaminophen (TYLENOL) 500 MG tablet Take 1,000 mg by mouth 3 (three) times daily.    Yes Historical Provider, MD  aspirin EC 325 MG tablet Take 325 mg by mouth every evening.   Yes Historical Provider, MD  bacitracin ointment Apply 1 application topically 2 (two) times daily. Applies to head of penis to decrease infection from catheter.   Yes Historical Provider, MD  Calcium Carbonate-Vit D-Min (CALTRATE 600+D PLUS) 600-800 MG-UNIT CHEW Chew 1 tablet by mouth 2 (two) times daily with a meal.    Yes Historical Provider, MD  CRANBERRY SOFT PO Take 2 capsules by mouth 3 (three) times daily.    Yes Historical Provider, MD  Cyanocobalamin (VITAMIN B-12) 5000 MCG SUBL Place 5,000 mcg under the tongue 2 (two) times daily.   Yes Historical Provider, MD  ferrous sulfate 325 (65 FE) MG tablet Take 325 mg by mouth every other day.  Yes Historical Provider, MD  guaiFENesin (MUCINEX) 600 MG 12 hr tablet Take 600 mg by mouth 2 (two) times daily as needed for cough.   Yes Historical Provider, MD  hydrALAZINE (APRESOLINE) 25 MG tablet Take 1/2 tablet by mouth twice daily to control blood  pressure 11/14/14  Yes Mahima Pandey, MD  lactose free nutrition (BOOST) LIQD Take 90 mLs by mouth 3 (three) times daily between meals. Patient taking differently: Take 90 mLs by mouth 2 (two) times daily between meals.  07/01/14  Yes Barton Dubois, MD  levothyroxine (SYNTHROID, LEVOTHROID) 25 MCG tablet TAKE 1 TABLET BY MOUTH DAILY 10/19/14  Yes Tiffany L Reed, DO  lidocaine (LIDODERM) 5 % PLACE 3 PATCHES ONTO THE SKIN DAILY. REMOVE & DISCARD PATCH WITHIN 12 HOURS OR AS DIRECTED BY MD Patient taking differently: PLACE 1-3 PATCHES as needed for pain place ONTO THE SKIN DAILY. REMOVE & DISCARD PATCH WITHIN 12 HOURS OR AS DIRECTED BY MD 02/16/15  Yes Tiffany L Reed, DO  Melatonin 5 MG TABS Take 5 mg by mouth at bedtime as needed (For sleep.).   Yes Historical Provider, MD  memantine (NAMENDA) 10 MG tablet Take 10 mg by mouth 2 (two) times daily.   Yes Historical Provider, MD  Multiple Vitamins-Minerals (ICAPS AREDS FORMULA PO) Take 2 capsules by mouth 2 (two) times daily with a meal.    Yes Historical Provider, MD  NON FORMULARY D-Mannose, Take 2 teaspoon by mouth with fluids 2 times a day   Yes Historical Provider, MD  polyethylene glycol (MIRALAX / GLYCOLAX) packet Take 17 g by mouth daily as needed for mild constipation. 07/01/14  Yes Barton Dubois, MD  Probiotic Product ([place]) CAPS Take 1 capsule by mouth 2 (two) times daily.   Yes Historical Provider, MD  psyllium (METAMUCIL) 58.6 % packet 2 tablespoons two times daily   Yes Historical Provider, MD  Rivaroxaban (XARELTO) 15 MG TABS tablet Take 1 tablet (15 mg total) by mouth 2 (two) times daily with a meal. 04/19/15 05/11/15 Yes Tiffany L Reed, DO  VOLTAREN 1 % GEL Apply 2 g topically 4 (four) times daily.  07/10/14  Yes Historical Provider, MD  AMBULATORY NON FORMULARY MEDICATION Pressure Air Mattress Dx: [id number], [id number] Patient not taking: Reported on 04/22/2015 10/30/14   Gayland Curry, DO    Allergies:   Allergies  Allergen  Reactions  . Other Other (See Comments)    Dairy products- cause swallowing difficulty     Social History:  Ambulatory   bed bound Lives at home With family     reports that he has quit smoking. He has never used smokeless tobacco. He reports that he does not drink alcohol or use illicit drugs.    Family History: family history includes Cancer in his mother; Emphysema in his brother; Kidney disease in his brother; Liver disease in his brother and father; Stroke in his father.    Physical Exam: Patient Vitals for the past 24 hrs:  BP Temp Temp src Pulse Resp SpO2  04/22/15 2240 - - - 62 15 -  04/22/15 2125 - - - 63 - -  04/22/15 1944 144/78 mmHg - - 70 18 96 %  04/22/15 1720 142/71 mmHg - - 61 16 97 %  04/22/15 1720 - 97.5 F (36.4 C) Rectal - - -  04/22/15 1614 - - - 69 16 97 %  04/22/15 1537 133/66 mmHg - - - - -  04/22/15 1350 135/84 mmHg 98 F (36.7 C)  Oral 72 23 -    1. General:  in No Acute distress 2. Psychological: Alert but not Oriented 3. Head/ENT:     Dry Mucous Membranes                          Head Non traumatic, neck supple                            Poor Dentition 4. SKIN:   decreased Skin turgor,  Skin clean Dry and intact no rash 5. Heart: Regular rate and rhythm no Murmur, Rub or gallop 6. Lungs: Clear to auscultation bilaterally, no wheezes or crackles   7. Abdomen: Soft, non-tender, Non distended indwelling for the catheter present 8. Lower extremities: no clubbing, cyanosis, or edema 9. Neurologically Grossly intact, moving all 4 extremities equally 10. MSK: Normal range of motion  body mass index is unknown because there is no weight on file.   Labs on Admission:   Results for orders placed or performed during the hospital encounter of 04/22/15 (from the past 24 hour(s))  Urinalysis, Routine w reflex microscopic     Status: Abnormal   Collection Time: 04/22/15  1:23 PM  Result Value Ref Range   Color, Urine YELLOW YELLOW   APPearance  CLEAR CLEAR   Specific Gravity, Urine 1.003 (L) 1.005 - 1.030   pH 7.5 5.0 - 8.0   Glucose, UA NEGATIVE NEGATIVE mg/dL   Hgb urine dipstick SMALL (A) NEGATIVE   Bilirubin Urine NEGATIVE NEGATIVE   Ketones, ur NEGATIVE NEGATIVE mg/dL   Protein, ur NEGATIVE NEGATIVE mg/dL   Urobilinogen, UA 0.2 0.0 - 1.0 mg/dL   Nitrite NEGATIVE NEGATIVE   Leukocytes, UA SMALL (A) NEGATIVE  Urine microscopic-add on     Status: None   Collection Time: 04/22/15  1:23 PM  Result Value Ref Range   Squamous Epithelial / LPF RARE RARE   WBC, UA 11-20 <3 WBC/hpf   RBC / HPF 3-6 <3 RBC/hpf   Urine-Other FEW YEAST   Basic metabolic panel     Status: Abnormal   Collection Time: 04/22/15  4:09 PM  Result Value Ref Range   Sodium 134 (L) 135 - 145 mmol/L   Potassium 4.2 3.5 - 5.1 mmol/L   Chloride 97 (L) 101 - 111 mmol/L   CO2 29 22 - 32 mmol/L   Glucose, Bld 96 65 - 99 mg/dL   BUN 16 6 - 20 mg/dL   Creatinine, Ser 0.72 0.61 - 1.24 mg/dL   Calcium 8.9 8.9 - 10.3 mg/dL   GFR calc non Af Amer >60 >60 mL/min   GFR calc Af Amer >60 >60 mL/min   Anion gap 8 5 - 15  CBC with Differential     Status: Abnormal   Collection Time: 04/22/15  4:09 PM  Result Value Ref Range   WBC 5.9 4.0 - 10.5 K/uL   RBC 2.21 (L) 4.22 - 5.81 MIL/uL   Hemoglobin 6.5 (LL) 13.0 - 17.0 g/dL   HCT 20.1 (L) 39.0 - 52.0 %   MCV 91.0 78.0 - 100.0 fL   MCH 29.4 26.0 - 34.0 pg   MCHC 32.3 30.0 - 36.0 g/dL   RDW 15.7 (H) 11.5 - 15.5 %   Platelets 229 150 - 400 K/uL   Neutrophils Relative % 58 43 - 77 %   Neutro Abs 3.4 1.7 - 7.7 K/uL   Lymphocytes Relative 32  12 - 46 %   Lymphs Abs 1.9 0.7 - 4.0 K/uL   Monocytes Relative 10 3 - 12 %   Monocytes Absolute 0.6 0.1 - 1.0 K/uL   Eosinophils Relative 1 0 - 5 %   Eosinophils Absolute 0.1 0.0 - 0.7 K/uL   Basophils Relative 0 0 - 1 %   Basophils Absolute 0.0 0.0 - 0.1 K/uL  POC occult blood, ED     Status: None   Collection Time: 04/22/15  5:27 PM  Result Value Ref Range   Fecal Occult  Bld NEGATIVE NEGATIVE  Type and screen for Red Blood Exchange     Status: None (Preliminary result)   Collection Time: 04/22/15  5:30 PM  Result Value Ref Range   ABO/RH(D) AB POS    Antibody Screen NEG    Sample Expiration 04/25/2015    Unit Number [id number]    Blood Component Type RED CELLS,LR    Unit division 00    Status of Unit ALLOCATED    Transfusion Status OK TO TRANSFUSE    Crossmatch Result Compatible    Unit Number [id number]    Blood Component Type RED CELLS,LR    Unit division 00    Status of Unit ALLOCATED    Transfusion Status OK TO TRANSFUSE    Crossmatch Result Compatible    Unit Number [id number]    Blood Component Type RED CELLS,LR    Unit division 00    Status of Unit ALLOCATED    Transfusion Status OK TO TRANSFUSE    Crossmatch Result Compatible    Unit Number [id number]    Blood Component Type RED CELLS,LR    Unit division 00    Status of Unit ALLOCATED    Transfusion Status OK TO TRANSFUSE    Crossmatch Result Compatible   Vitamin B12     Status: Abnormal   Collection Time: 04/22/15  5:30 PM  Result Value Ref Range   Vitamin B-12 4823 (H) 180 - 914 pg/mL  Folate     Status: None   Collection Time: 04/22/15  5:30 PM  Result Value Ref Range   Folate 18.5 >5.9 ng/mL  Iron and TIBC     Status: Abnormal   Collection Time: 04/22/15  5:30 PM  Result Value Ref Range   Iron 32 (L) 45 - 182 ug/dL   TIBC 209 (L) 250 - 450 ug/dL   Saturation Ratios 15 (L) 17.9 - 39.5 %   UIBC 177 ug/dL  Ferritin     Status: None   Collection Time: 04/22/15  5:30 PM  Result Value Ref Range   Ferritin 210 24 - 336 ng/mL  Reticulocytes     Status: Abnormal   Collection Time: 04/22/15  5:30 PM  Result Value Ref Range   Retic Ct Pct 5.8 (H) 0.4 - 3.1 %   RBC. 2.06 (L) 4.22 - 5.81 MIL/uL   Retic Count, Manual 119.5 19.0 - 186.0 K/uL  Hemoglobin and hematocrit, blood     Status: Abnormal   Collection Time: 04/22/15  8:20 PM  Result Value Ref Range    Hemoglobin 5.9 (LL) 13.0 - 17.0 g/dL   HCT 18.3 (L) 39.0 - 52.0 %  Prepare RBC     Status: None   Collection Time: 04/22/15 10:30 PM  Result Value Ref Range   Order Confirmation ORDER PROCESSED BY BLOOD BANK     UA 11-20 WBC  Lab Results  Component Value Date   HGBA1C 5.3 06/21/2012  CrCl cannot be calculated (Unknown ideal weight.).  BNP (last 3 results) No results for input(s): PROBNP in the last 8760 hours.  Other results:  I have pearsonaly reviewed this: ECG REPORT  Rate: 61  Rhythm: RBBB ST&T Change: no ischemic changes   There were no vitals filed for this visit.   Cultures:    Component Value Date/Time   SDES URINE, CATHETERIZED 03/31/2015 2110   SPECREQUEST NONE 03/31/2015 2110   CULT NO GROWTH Performed at [facility]  03/31/2015 2110   REPTSTATUS 04/01/2015 FINAL 03/31/2015 2110     Radiological Exams on Admission: Ct Abdomen Pelvis Wo Contrast  04/22/2015   CLINICAL DATA:  81 year old male with anemia and UTI. Decreased urine output. Evaluate for retroperitoneal bleed.  EXAM: CT ABDOMEN AND PELVIS WITHOUT CONTRAST  TECHNIQUE: Multidetector CT imaging of the abdomen and pelvis was performed following the standard protocol without IV contrast.  COMPARISON:  No prior CT.  FINDINGS: The heart is enlarged. Volume loss in the lung bases with likely atelectasis in the left greater than right lower lobe.  No stranding in the retroperitoneum to suggest retroperitoneal bleed.  Evaluation of the solid and hollow viscera is limited given lack of contrast, paucity of intra-abdominal fat, and arms down positioning.  Homogeneous attenuation to the liver, no focal lesion allowing for noncontrast exam. Gallbladder is decompressed. The unenhanced spleen is normal in size. The pancreas is not well-defined, no definite peripancreatic inflammatory change. The adrenal glands are not well defined. There is no hydronephrosis. No renal stones. Limited bowel assessment, no  small bowel dilatation. There is equivocal gastric wall thickening. Moderate stool throughout the colon. Suspect air-filled appendix in the right lower quadrant.  Diffuse atherosclerosis of the abdominal aorta without aneurysm. Decreased density of blood pool consistent with anemia.  Fluid-filled bowel loops in the pelvis. Urinary bladder is distended with irregular wall, Foley catheter is in place. Prostate gland appears enlarged.  No definite free air free fluid in the abdomen or pelvis. Mild whole body wall edema.  There is diffuse bony under mineralization. Sequela of right superior inferior pubic rami fractures. Compression deformity of L1.  IMPRESSION: 1. No evidence of retroperitoneal bleed. 2. Urinary bladder is distended with irregular wall, Foley catheter in place. This may be due to chronic bladder outlet obstruction, prostate gland enlarged. 3. Evaluation of the remaining intra-abdominal viscera is limited given lack contrast and paucity of intra-abdominal fat. Equivocal gastric wall thickening. There is dense atherosclerosis of the aorta and its branches without aneurysm. Cardiomegaly. 4. Whole body wall edema, suspect third-spacing.   Electronically Signed   By: Jeb Levering M.D.   On: 04/22/2015 22:20   Dg Chest 2 View  04/22/2015   CLINICAL DATA:  Fever.  EXAM: CHEST  2 VIEW  COMPARISON:  04/18/2015  FINDINGS: Dissemination is quite limited due to the position of the patient has arms which are obscuring most of the chest. The heart is enlarged. There is tortuosity and calcification of the thoracic aorta. The lungs are grossly clear.  IMPRESSION: Very limited examination.  No obvious/gross acute pulmonary process   Electronically Signed   By: Marijo Sanes M.D.   On: 04/22/2015 16:58    Chart has been reviewed  Family   at  Bedside  plan of care was discussed with  [address] [place] number [phone] and daughter Gloris [place] [phone]   Assessment/Plan  81 year old gentleman with  history of dementia chronic indwelling catheter presents with low-grade fever and confusion  Was incidentally found to have significant anemia down to 6.5. After rehydration hemoglobin dropped to 5.9. Family came forward with information for patient hadan episode of large tarry stools Thursday. Of note there after patient was evaluated for left arm swelling and was found to have left arm DVT. PCP was not aware of history of recent tarry stools and prescribed xarelto. Patient had a total of 3 doses. No repeat tarry stool stance. No blood in stool. Hemoccult-negative. Suspect possibly GI bleed this has currently resolved but resulted in anemia discussed with GI who will see patient in the morning.  Present on Admission:  . Acute encephalopathy likely secondary to dehydration, improving we'll continue with gentle IV fluids  . Dehydration IV fluids  . Dementia on a patient at baseline pleasant demented and speaks softly but able to recognize family members  . Iron deficiency anemia - significant anemia worrisome for GI bleeding on Thursday likely at this point has stopped. Will DC Xarelto. Stop aspirin. Make patient nothing by mouth for possible endoscopy tomorrow. Transfuse 2 units now and recheck hemoglobin. History of right upper extremity DVT - stop Xarelto discussed with patient's family risk of pulmonary embolism but in the case of severe anemia and possible GI bleeding will need to have this evaluated first before Xarelto could be restarted Low-grade fever - patient again noted to have white blood cell, in urine worrisome for chronic conization. Obtain urine culture and blood cultures so far holding off on antibiotics no evidence of sepsis of note patient is UTI in the past currently afebrile History of sacral wound - wound consult    Prophylaxis: SCD    CODE STATUS:  DNR   as per patient  family  Disposition:  To home once workup is complete and patient is stable  Other plan as per  orders.  I have spent a total of 75 min on this admission extra time was taken to discuss case with radiology as well as Dr. Maurene Capes with GI this wilted extensively discuss case with family and answer all the questions 30 minutes was spent face-to-face with family members  Raveen Wieseler 04/22/2015, 8:21 PM  [facility]  Pager [phone]   after 2 AM please page floor coverage PA If 7AM-7PM, please contact the day team taking care of the patient  [email or web address]  Password [email or web address]

## 2015-04-23 ENCOUNTER — Encounter (HOSPITAL_COMMUNITY): Payer: Self-pay | Admitting: Physician Assistant

## 2015-04-23 DIAGNOSIS — E860 Dehydration: Secondary | ICD-10-CM

## 2015-04-23 LAB — COMPREHENSIVE METABOLIC PANEL
ALK PHOS: 49 U/L (ref 38–126)
ALT: 18 U/L (ref 17–63)
AST: 35 U/L (ref 15–41)
Albumin: 2.7 g/dL — ABNORMAL LOW (ref 3.5–5.0)
Anion gap: 9 (ref 5–15)
BUN: 9 mg/dL (ref 6–20)
CO2: 25 mmol/L (ref 22–32)
CREATININE: 0.65 mg/dL (ref 0.61–1.24)
Calcium: 8.4 mg/dL — ABNORMAL LOW (ref 8.9–10.3)
Chloride: 102 mmol/L (ref 101–111)
GFR calc Af Amer: >60 mL/min (ref >60)
GLUCOSE: 68 mg/dL (ref 65–99)
POTASSIUM: 3.6 mmol/L (ref 3.5–5.1)
SODIUM: 136 mmol/L (ref 135–145)
TOTAL PROTEIN: 6.1 g/dL — AB (ref 6.5–8.1)
Total Bilirubin: 1.7 mg/dL — ABNORMAL HIGH (ref 0.3–1.2)

## 2015-04-23 LAB — LACTATE DEHYDROGENASE: LDH: 232 U/L — ABNORMAL HIGH (ref 98–192)

## 2015-04-23 LAB — HEPATIC FUNCTION PANEL
ALBUMIN: 2.9 g/dL — AB (ref 3.5–5.0)
ALT: 20 U/L (ref 17–63)
AST: 35 U/L (ref 15–41)
Alkaline Phosphatase: 50 U/L (ref 38–126)
Bilirubin, Direct: 0.3 mg/dL (ref 0.1–0.5)
Indirect Bilirubin: 0.8 mg/dL (ref 0.3–0.9)
Total Bilirubin: 1.1 mg/dL (ref 0.3–1.2)
Total Protein: 6 g/dL — ABNORMAL LOW (ref 6.5–8.1)

## 2015-04-23 LAB — CBC
HCT: 19.7 % — ABNORMAL LOW (ref 39.0–52.0)
HEMOGLOBIN: 6.7 g/dL — AB (ref 13.0–17.0)
MCH: 29.1 pg (ref 26.0–34.0)
MCHC: 33.5 g/dL (ref 30.0–36.0)
MCV: 86.8 fL (ref 78.0–100.0)
Platelets: 209 10*3/uL (ref 150–400)
RBC: 2.27 MIL/uL — ABNORMAL LOW (ref 4.22–5.81)
RDW: 15.7 % — ABNORMAL HIGH (ref 11.5–15.5)
WBC: 4.7 10*3/uL (ref 4.0–10.5)

## 2015-04-23 LAB — PREPARE RBC (CROSSMATCH)

## 2015-04-23 LAB — ABO/RH: ABO/RH(D): AB POS

## 2015-04-23 LAB — PHOSPHORUS: Phosphorus: 3.3 mg/dL (ref 2.5–4.6)

## 2015-04-23 LAB — MAGNESIUM: MAGNESIUM: 1.6 mg/dL — AB (ref 1.7–2.4)

## 2015-04-23 LAB — LACTIC ACID, PLASMA: LACTIC ACID, VENOUS: 1.2 mmol/L (ref 0.5–2.0)

## 2015-04-23 LAB — TSH: TSH: 1.352 u[IU]/mL (ref 0.350–4.500)

## 2015-04-23 MED ORDER — SODIUM CHLORIDE 0.9 % IJ SOLN
3.0000 mL | Freq: Two times a day (BID) | INTRAMUSCULAR | Status: DC
  Administered 2015-04-23 – 2015-04-25 (×5): 3 mL via INTRAVENOUS

## 2015-04-23 MED ORDER — SODIUM CHLORIDE 0.9 % IV SOLN: Freq: Once | INTRAVENOUS | Status: DC

## 2015-04-23 MED ORDER — ACETAMINOPHEN 650 MG RE SUPP: 650.0000 mg | Freq: Four times a day (QID) | RECTAL | Status: DC | PRN

## 2015-04-23 MED ORDER — ACETAMINOPHEN 325 MG PO TABS
650.0000 mg | ORAL_TABLET | Freq: Four times a day (QID) | ORAL | Status: DC | PRN
  Administered 2015-04-25: 650 mg via ORAL
  Filled 2015-04-23: qty 2

## 2015-04-23 MED ORDER — SODIUM CHLORIDE 0.9 % IV SOLN
Freq: Once | INTRAVENOUS | Status: AC
  Administered 2015-04-23: 10:00:00 via INTRAVENOUS

## 2015-04-23 MED ORDER — SODIUM CHLORIDE 0.9 % IV SOLN
INTRAVENOUS | Status: DC
  Administered 2015-04-23: 05:00:00 via INTRAVENOUS

## 2015-04-23 MED ORDER — HYDROCODONE-ACETAMINOPHEN 5-325 MG PO TABS: 1.0000 | ORAL_TABLET | ORAL | Status: DC | PRN

## 2015-04-23 MED ORDER — LEVOTHYROXINE SODIUM 25 MCG PO TABS
25.0000 ug | ORAL_TABLET | Freq: Every day | ORAL | Status: DC
  Administered 2015-04-25: 25 ug via ORAL
  Filled 2015-04-23 (×4): qty 1

## 2015-04-23 MED ORDER — MEMANTINE HCL 10 MG PO TABS
10.0000 mg | ORAL_TABLET | Freq: Two times a day (BID) | ORAL | Status: DC
  Administered 2015-04-23 – 2015-04-25 (×3): 10 mg via ORAL
  Filled 2015-04-23 (×6): qty 1

## 2015-04-23 MED ORDER — SODIUM CHLORIDE 0.9 % IV SOLN
Freq: Once | INTRAVENOUS | Status: AC
  Administered 2015-04-23: 16:00:00 via INTRAVENOUS

## 2015-04-23 MED ORDER — ONDANSETRON HCL 4 MG PO TABS: 4.0000 mg | ORAL_TABLET | Freq: Four times a day (QID) | ORAL | Status: DC | PRN

## 2015-04-23 MED ORDER — MAGNESIUM SULFATE 2 GM/50ML IV SOLN
2.0000 g | Freq: Once | INTRAVENOUS | Status: AC
  Administered 2015-04-23: 2 g via INTRAVENOUS
  Filled 2015-04-23: qty 50

## 2015-04-23 MED ORDER — PANTOPRAZOLE SODIUM 40 MG IV SOLR
40.0000 mg | Freq: Two times a day (BID) | INTRAVENOUS | Status: DC
  Administered 2015-04-23 – 2015-04-25 (×5): 40 mg via INTRAVENOUS
  Filled 2015-04-23 (×7): qty 40

## 2015-04-23 MED ORDER — POLYETHYLENE GLYCOL 3350 17 G PO PACK
17.0000 g | PACK | Freq: Every day | ORAL | Status: DC | PRN
  Filled 2015-04-23: qty 1

## 2015-04-23 MED ORDER — MAGNESIUM SULFATE 2 GM/50ML IV SOLN
2.0000 g | Freq: Once | INTRAVENOUS | Status: DC
  Filled 2015-04-23: qty 50

## 2015-04-23 MED ORDER — GUAIFENESIN ER 600 MG PO TB12
600.0000 mg | ORAL_TABLET | Freq: Two times a day (BID) | ORAL | Status: DC | PRN
Start: 2015-04-23 — End: 2015-04-26
  Filled 2015-04-23: qty 1

## 2015-04-23 MED ORDER — ONDANSETRON HCL 4 MG/2ML IJ SOLN: 4.0000 mg | Freq: Four times a day (QID) | INTRAMUSCULAR | Status: DC | PRN

## 2015-04-23 NOTE — Progress Notes (Signed)
Pt has orders to transfuse blood. Just informed by lab that pt has to be typed ans screen again since orders are from [facility] to transfuse blood. Nurse will comply.

## 2015-04-23 NOTE — ED Notes (Signed)
Report given to [facility]. They are 10 out.

## 2015-04-23 NOTE — ED Notes (Signed)
Pt is afebrile at 98.5, no rashes noted, he denies pain, no hematuria noted in foley. Lung sounds clear. VSS

## 2015-04-23 NOTE — ED Notes (Signed)
IV running blood without difficulty. IV clean, dry an intact. No bruising noted. Pt denies further needs. VSS. Will continue to monitor.

## 2015-04-23 NOTE — Progress Notes (Signed)
Pt' family called and informed pt is on the floor.

## 2015-04-23 NOTE — ED Notes (Signed)
15 min after blood transfusion administration has been met. No suspected reaction has occurred. No hematuria noted, lung sounds clear, no rash or petechia present. Blood flowing through IV site with out difficulty.

## 2015-04-23 NOTE — ED Notes (Signed)
Pt is comfortably resting in bed. He has no complaints at this time. His lungs sound clear and there is no alteration in his skin or condition.

## 2015-04-23 NOTE — ED Notes (Signed)
[facility] at bedside 

## 2015-04-23 NOTE — Consult Note (Signed)
Reason for Consult: Melena and anemia. Referring Physician: THP  HARNOOR [name] is an 81 y.o. male.  HPI: 81 year old black male with multiple medical issues listed below, recently discharged from the hospital after being treated for a UTI, readmitted with lethargy, melena and FTT. Workup in the ER revealed a drop in the hemoglobin from 11 gm/dl to 6.5 gm/dl. He is unable to provide any details on his history. Details mostly procured from the chart.   Past Medical History  Diagnosis Date  . IBS (irritable bowel syndrome)   . Diverticulosis   . Cataract   . OAB (overactive bladder)   . Macular degeneration   . Hypertension   . Parkinsonian syndrome   . Syncope, vasovagal   . 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 Unspecified transient cerebral ischemia   . Unspecified urinary incontinence   . Hypothyroidism   . Anemia, unspecified   . Depression   . Alzheimer's disease   . Gout, unspecified   . Thoracic or lumbosacral neuritis or radiculitis, unspecified   . Progressive supranuclear palsy   . Paralysis agitans   . Pneumonitis due to inhalation of food or vomitus   . Cellulitis and abscess of oral soft tissues   . Unspecified hypothyroidism   . Pneumonia, organism unspecified   . Pseudomonas infection in conditions classified elsewhere and of unspecified site   . Urinary tract infection, site not specified   . Osteoarthrosis, unspecified whether generalized or localized, unspecified site   . Osteoporosis, unspecified   . Gout, unspecified   . Thoracic or lumbosacral neuritis or radiculitis, unspecified   . Stroke    Past Surgical History  Procedure Laterality Date  . Esophagogastroduodenoscopy    . Eye surgery  2006    LEFT CATARACT   Family History  Problem Relation Age of Onset  . Cancer Mother   . Stroke Father   . Liver disease Father   . Kidney disease Brother   . Liver disease Brother   . Emphysema Brother    Social History:   reports that he has quit smoking. He has never used smokeless tobacco. He reports that he does not drink alcohol or use illicit drugs.  Allergies:  Allergies  Allergen Reactions  . Other Other (See Comments)    Dairy products- cause swallowing difficulty    Medications: I have reviewed the patient's current medications.  Results for orders placed or performed during the hospital encounter of 04/22/15 (from the past 48 hour(s))  Urinalysis, Routine w reflex microscopic     Status: Abnormal   Collection Time: 04/22/15  1:23 PM  Result Value Ref Range   Color, Urine YELLOW YELLOW   APPearance CLEAR CLEAR   Specific Gravity, Urine 1.003 (L) 1.005 - 1.030   pH 7.5 5.0 - 8.0   Glucose, UA NEGATIVE NEGATIVE mg/dL   Hgb urine dipstick SMALL (A) NEGATIVE   Bilirubin Urine NEGATIVE NEGATIVE   Ketones, ur NEGATIVE NEGATIVE mg/dL   Protein, ur NEGATIVE NEGATIVE mg/dL   Urobilinogen, UA 0.2 0.0 - 1.0 mg/dL   Nitrite NEGATIVE NEGATIVE   Leukocytes, UA SMALL (A) NEGATIVE  Urine microscopic-add on     Status: None   Collection Time: 04/22/15  1:23 PM  Result Value Ref Range   Squamous Epithelial / LPF RARE RARE   WBC, UA 11-20 <3 WBC/hpf   RBC / HPF 3-6 <3 RBC/hpf   Urine-Other FEW YEAST   Basic metabolic panel  Status: Abnormal   Collection Time: 04/22/15  4:09 PM  Result Value Ref Range   Sodium 134 (L) 135 - 145 mmol/L   Potassium 4.2 3.5 - 5.1 mmol/L   Chloride 97 (L) 101 - 111 mmol/L   CO2 29 22 - 32 mmol/L   Glucose, Bld 96 65 - 99 mg/dL   BUN 16 6 - 20 mg/dL   Creatinine, Ser 0.72 0.61 - 1.24 mg/dL   Calcium 8.9 8.9 - 10.3 mg/dL   GFR calc non Af Amer >60 >60 mL/min   GFR calc Af Amer >60 >60 mL/min    Comment: (NOTE) The eGFR has been calculated using the CKD EPI equation. This calculation has not been validated in all clinical situations. eGFR's persistently <60 mL/min signify possible Chronic Kidney Disease.    Anion gap 8 5 - 15  CBC with Differential     Status:  Abnormal   Collection Time: 04/22/15  4:09 PM  Result Value Ref Range   WBC 5.9 4.0 - 10.5 K/uL   RBC 2.21 (L) 4.22 - 5.81 MIL/uL   Hemoglobin 6.5 (LL) 13.0 - 17.0 g/dL    Comment: SPECIMEN CHECKED FOR CLOTS REPEATED TO VERIFY CRITICAL RESULT CALLED TO, READ BACK BY AND VERIFIED WITH: LAUREN MCCLURE AT 0430 04/22/15. K.PAXTON    HCT 20.1 (L) 39.0 - 52.0 %   MCV 91.0 78.0 - 100.0 fL   MCH 29.4 26.0 - 34.0 pg   MCHC 32.3 30.0 - 36.0 g/dL   RDW 15.7 (H) 11.5 - 15.5 %   Platelets 229 150 - 400 K/uL   Neutrophils Relative % 58 43 - 77 %   Neutro Abs 3.4 1.7 - 7.7 K/uL   Lymphocytes Relative 32 12 - 46 %   Lymphs Abs 1.9 0.7 - 4.0 K/uL   Monocytes Relative 10 3 - 12 %   Monocytes Absolute 0.6 0.1 - 1.0 K/uL   Eosinophils Relative 1 0 - 5 %   Eosinophils Absolute 0.1 0.0 - 0.7 K/uL   Basophils Relative 0 0 - 1 %   Basophils Absolute 0.0 0.0 - 0.1 K/uL  POC occult blood, ED     Status: None   Collection Time: 04/22/15  5:27 PM  Result Value Ref Range   Fecal Occult Bld NEGATIVE NEGATIVE  Type and screen for Red Blood Exchange     Status: None (Preliminary result)   Collection Time: 04/22/15  5:30 PM  Result Value Ref Range   ABO/RH(D) AB POS    Antibody Screen NEG    Sample Expiration 04/25/2015    Unit Number [id number]    Blood Component Type RED CELLS,LR    Unit division 00    Status of Unit ISSUED,FINAL    Transfusion Status OK TO TRANSFUSE    Crossmatch Result Compatible    Unit Number [id number]    Blood Component Type RED CELLS,LR    Unit division 00    Status of Unit ALLOCATED    Transfusion Status OK TO TRANSFUSE    Crossmatch Result Compatible    Unit Number [id number]    Blood Component Type RED CELLS,LR    Unit division 00    Status of Unit ALLOCATED    Transfusion Status OK TO TRANSFUSE    Crossmatch Result Compatible    Unit Number [id number]    Blood Component Type RED CELLS,LR    Unit division 00    Status of Unit ALLOCATED    Transfusion  Status OK TO TRANSFUSE  Crossmatch Result Compatible   Vitamin B12     Status: Abnormal   Collection Time: 04/22/15  5:30 PM  Result Value Ref Range   Vitamin B-12 4823 (H) 180 - 914 pg/mL    Comment: (NOTE) This assay is not validated for testing neonatal or myeloproliferative syndrome specimens for Vitamin B12 levels. Performed at [facility]   Folate     Status: None   Collection Time: 04/22/15  5:30 PM  Result Value Ref Range   Folate 18.5 >5.9 ng/mL    Comment: Performed at [facility]  Iron and TIBC     Status: Abnormal   Collection Time: 04/22/15  5:30 PM  Result Value Ref Range   Iron 32 (L) 45 - 182 ug/dL   TIBC 209 (L) 250 - 450 ug/dL   Saturation Ratios 15 (L) 17.9 - 39.5 %   UIBC 177 ug/dL    Comment: Performed at [facility]  Ferritin     Status: None   Collection Time: 04/22/15  5:30 PM  Result Value Ref Range   Ferritin 210 24 - 336 ng/mL    Comment: Performed at [facility]  Reticulocytes     Status: Abnormal   Collection Time: 04/22/15  5:30 PM  Result Value Ref Range   Retic Ct Pct 5.8 (H) 0.4 - 3.1 %   RBC. 2.06 (L) 4.22 - 5.81 MIL/uL   Retic Count, Manual 119.5 19.0 - 186.0 K/uL  Hemoglobin and hematocrit, blood     Status: Abnormal   Collection Time: 04/22/15  8:20 PM  Result Value Ref Range   Hemoglobin 5.9 (LL) 13.0 - 17.0 g/dL    Comment: RESULT REPEATED AND VERIFIED CRITICAL RESULT CALLED TO, READ BACK BY AND VERIFIED WITH: MCCLURE,L AT 2045 ON [id number] BY HOOKER,B    HCT 18.3 (L) 39.0 - 52.0 %  Prepare RBC     Status: None   Collection Time: 04/22/15 10:30 PM  Result Value Ref Range   Order Confirmation ORDER PROCESSED BY BLOOD BANK   Hepatic function panel     Status: Abnormal   Collection Time: 04/23/15 12:15 AM  Result Value Ref Range   Total Protein 6.0 (L) 6.5 - 8.1 g/dL   Albumin 2.9 (L) 3.5 - 5.0 g/dL   AST 35 15 - 41 U/L   ALT 20 17 - 63 U/L   Alkaline Phosphatase 50 38 - 126 U/L   Total  Bilirubin 1.1 0.3 - 1.2 mg/dL   Bilirubin, Direct 0.3 0.1 - 0.5 mg/dL   Indirect Bilirubin 0.8 0.3 - 0.9 mg/dL  Lactate dehydrogenase     Status: Abnormal   Collection Time: 04/23/15 12:15 AM  Result Value Ref Range   LDH 232 (H) 98 - 192 U/L  Lactic acid, plasma     Status: None   Collection Time: 04/23/15 12:15 AM  Result Value Ref Range   Lactic Acid, Venous 1.2 0.5 - 2.0 mmol/L  Magnesium     Status: Abnormal   Collection Time: 04/23/15  5:34 AM  Result Value Ref Range   Magnesium 1.6 (L) 1.7 - 2.4 mg/dL  Phosphorus     Status: None   Collection Time: 04/23/15  5:34 AM  Result Value Ref Range   Phosphorus 3.3 2.5 - 4.6 mg/dL  TSH     Status: None   Collection Time: 04/23/15  5:34 AM  Result Value Ref Range   TSH 1.352 0.350 - 4.500 uIU/mL  Comprehensive metabolic panel  Status: Abnormal   Collection Time: 04/23/15  5:34 AM  Result Value Ref Range   Sodium 136 135 - 145 mmol/L   Potassium 3.6 3.5 - 5.1 mmol/L   Chloride 102 101 - 111 mmol/L   CO2 25 22 - 32 mmol/L   Glucose, Bld 68 65 - 99 mg/dL   BUN 9 6 - 20 mg/dL   Creatinine, Ser 0.65 0.61 - 1.24 mg/dL   Calcium 8.4 (L) 8.9 - 10.3 mg/dL   Total Protein 6.1 (L) 6.5 - 8.1 g/dL   Albumin 2.7 (L) 3.5 - 5.0 g/dL   AST 35 15 - 41 U/L   ALT 18 17 - 63 U/L   Alkaline Phosphatase 49 38 - 126 U/L   Total Bilirubin 1.7 (H) 0.3 - 1.2 mg/dL   GFR calc non Af Amer >60 >60 mL/min   GFR calc Af Amer >60 >60 mL/min    Comment: (NOTE) The eGFR has been calculated using the CKD EPI equation. This calculation has not been validated in all clinical situations. eGFR's persistently <60 mL/min signify possible Chronic Kidney Disease.    Anion gap 9 5 - 15  CBC     Status: Abnormal   Collection Time: 04/23/15  5:34 AM  Result Value Ref Range   WBC 4.7 4.0 - 10.5 K/uL   RBC 2.27 (L) 4.22 - 5.81 MIL/uL   Hemoglobin 6.7 (LL) 13.0 - 17.0 g/dL    Comment: REPEATED TO VERIFY CRITICAL RESULT CALLED TO, READ BACK BY AND VERIFIED  WITH: C.MARTIN,RN 04/23/15 0707 BY BSLADE    HCT 19.7 (L) 39.0 - 52.0 %   MCV 86.8 78.0 - 100.0 fL   MCH 29.1 26.0 - 34.0 pg   MCHC 33.5 30.0 - 36.0 g/dL   RDW 15.7 (H) 11.5 - 15.5 %   Platelets 209 150 - 400 K/uL  Type and screen     Status: None (Preliminary result)   Collection Time: 04/23/15  6:30 AM  Result Value Ref Range   ABO/RH(D) AB POS    Antibody Screen NEG    Sample Expiration 04/26/2015    Unit Number [id number]    Blood Component Type RED CELLS,LR    Unit division 00    Status of Unit ISSUED    Transfusion Status OK TO TRANSFUSE    Crossmatch Result Compatible    Unit Number [id number]    Blood Component Type RED CELLS,LR    Unit division 00    Status of Unit ISSUED    Transfusion Status OK TO TRANSFUSE    Crossmatch Result Compatible    Unit Number [id number]    Blood Component Type RED CELLS,LR    Unit division 00    Status of Unit ALLOCATED    Transfusion Status OK TO TRANSFUSE    Crossmatch Result Compatible   ABO/Rh     Status: None   Collection Time: 04/23/15  6:30 AM  Result Value Ref Range   ABO/RH(D) AB POS   Prepare RBC     Status: None   Collection Time: 04/23/15 10:11 AM  Result Value Ref Range   Order Confirmation ORDER PROCESSED BY BLOOD BANK   Prepare RBC     Status: None   Collection Time: 04/23/15  3:58 PM  Result Value Ref Range   Order Confirmation ORDER PROCESSED BY BLOOD BANK    Ct Abdomen Pelvis Wo Contrast  04/22/2015   CLINICAL DATA:  81 year old male with anemia and UTI. Decreased urine output. Evaluate for  retroperitoneal bleed.  EXAM: CT ABDOMEN AND PELVIS WITHOUT CONTRAST  TECHNIQUE: Multidetector CT imaging of the abdomen and pelvis was performed following the standard protocol without IV contrast.  COMPARISON:  No prior CT.  FINDINGS: The heart is enlarged. Volume loss in the lung bases with likely atelectasis in the left greater than right lower lobe.  No stranding in the retroperitoneum to suggest retroperitoneal  bleed.  Evaluation of the solid and hollow viscera is limited given lack of contrast, paucity of intra-abdominal fat, and arms down positioning.  Homogeneous attenuation to the liver, no focal lesion allowing for noncontrast exam. Gallbladder is decompressed. The unenhanced spleen is normal in size. The pancreas is not well-defined, no definite peripancreatic inflammatory change. The adrenal glands are not well defined. There is no hydronephrosis. No renal stones. Limited bowel assessment, no small bowel dilatation. There is equivocal gastric wall thickening. Moderate stool throughout the colon. Suspect air-filled appendix in the right lower quadrant.  Diffuse atherosclerosis of the abdominal aorta without aneurysm. Decreased density of blood pool consistent with anemia.  Fluid-filled bowel loops in the pelvis. Urinary bladder is distended with irregular wall, Foley catheter is in place. Prostate gland appears enlarged.  No definite free air free fluid in the abdomen or pelvis. Mild whole body wall edema.  There is diffuse bony under mineralization. Sequela of right superior inferior pubic rami fractures. Compression deformity of L1.  IMPRESSION: 1. No evidence of retroperitoneal bleed. 2. Urinary bladder is distended with irregular wall, Foley catheter in place. This may be due to chronic bladder outlet obstruction, prostate gland enlarged. 3. Evaluation of the remaining intra-abdominal viscera is limited given lack contrast and paucity of intra-abdominal fat. Equivocal gastric wall thickening. There is dense atherosclerosis of the aorta and its branches without aneurysm. Cardiomegaly. 4. Whole body wall edema, suspect third-spacing.   Electronically Signed   By: Jeb Levering M.D.   On: 04/22/2015 22:20   Dg Chest 2 View  04/22/2015   CLINICAL DATA:  Fever.  EXAM: CHEST  2 VIEW  COMPARISON:  04/18/2015  FINDINGS: Dissemination is quite limited due to the position of the patient has arms which are obscuring  most of the chest. The heart is enlarged. There is tortuosity and calcification of the thoracic aorta. The lungs are grossly clear.  IMPRESSION: Very limited examination.  No obvious/gross acute pulmonary process   Electronically Signed   By: Marijo Sanes M.D.   On: 04/22/2015 16:58   Review of Systems  Constitutional: Positive for malaise/fatigue. Negative for fever, chills and diaphoresis.  HENT: Negative.   Eyes: Negative.   Respiratory: Negative.   Cardiovascular: Negative.   Gastrointestinal: Positive for constipation, blood in stool and melena. Negative for heartburn, nausea, vomiting and abdominal pain.  Genitourinary: Positive for frequency.  Musculoskeletal: Positive for back pain and joint pain.  Skin: Negative.   Neurological: Positive for weakness.  Psychiatric/Behavioral: Positive for memory loss.   Blood pressure 156/80, pulse 62, temperature 98.1 F (36.7 C), temperature source Oral, resp. rate 14, weight 56.79 kg (125 lb 3.2 oz), SpO2 100 %. Physical Exam  Constitutional: He appears listless. He appears cachectic. He has a sickly appearance.  Cardiovascular: Regular rhythm.   GI: Soft. Bowel sounds are normal. There is tenderness in the epigastric area.  Neurological: He appears listless.   Assessment/Plan: Assessment/Plan: 1) Melena and anemia-?epigastric pain-will schedule patient for an EGD tomorrow. Serial  CBC's to be monitored. 2) Failure to thrive. 3) Alzheimer's dementia. 4) HTN/Hyperlipidemia. 5) History of  CVA. 6) DDD/Gout.  Jozee Hammer 04/23/2015, 7:16 PM

## 2015-04-23 NOTE — Consult Note (Signed)
Reason for Consult: Anemia with melena, Referring Physician: THP.  Ricky [name] is an 81 y.o. male.  HPI: 81 year old black male with multiple medical issues listed below, recently discharged from the hospital after being treated for a UTI, readmitted with lethargy, melena and FTT. Workup in the ER revealed a drop in the hemoglobin from 11 gm/dl to 6.5 gm/dl. He is unable to provide any details on his history. Details mostly procured from the chart.  Past Medical History  Diagnosis Date  . IBS (irritable bowel syndrome)   . Diverticulosis   . Cataract   . OAB (overactive bladder)   . Macular degeneration   . Alzheimer disease   . Hypertension   . Arthritis   . Osteoporosis   . Parkinsonian syndrome   . Syncope, vasovagal   . Neuromuscular disorder     parkisonian syndrome  . 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 Unspecified transient cerebral ischemia   . Unspecified urinary incontinence   . Senile dementia, uncomplicated   . Unspecified constipation   . Hypothyroidism   . Anemia, unspecified   . Depression   . Alzheimer's disease   . Gout, unspecified   . Thoracic or lumbosacral neuritis or radiculitis, unspecified   . Progressive supranuclear palsy   . Unspecified transient cerebral ischemia   . Unspecified urinary incontinence   . Syncope and collapse   . Paralysis agitans   . Unspecified transient cerebral ischemia   . Pneumonitis due to inhalation of food or vomitus   . Cellulitis and abscess of oral soft tissues   . Screening for lipoid disorders   . Senile dementia, uncomplicated   . Unspecified constipation   . Disorder of bone and cartilage, unspecified   . Other specified acquired hypothyroidism   . Unspecified hypothyroidism   . Anemia, unspecified   . Depressive disorder, not elsewhere classified    . Alzheimer's disease   . Paralysis agitans   . Pneumonia, organism unspecified   . Pseudomonas infection in conditions classified elsewhere and of unspecified site   . Unspecified essential hypertension   . Urinary tract infection, site not specified   . Osteoarthrosis, unspecified whether generalized or localized, unspecified site   . Osteoporosis, unspecified   . Gout, unspecified   . Thoracic or lumbosacral neuritis or radiculitis, unspecified   . Stroke        Past Surgical History  Procedure Laterality Date  . Esophagogastroduodenoscopy    . Eye surgery  2006    LEFT CATARACT   Family History  Problem Relation Age of Onset  . Cancer Mother   . Stroke Father   . Liver disease Father   . Kidney disease Brother   . Liver disease Brother   . Emphysema Brother     Social History:  reports that he has quit smoking. He has never used smokeless tobacco. He reports that he does not drink alcohol or use illicit drugs.  Allergies:  Allergies  Allergen Reactions  . Other Other (See Comments)    Dairy products- cause swallowing difficulty    Medications: I have reviewed the patient's current medications.  Results for orders placed or performed during the hospital encounter of 04/22/15 (from the past 48 hour(s))  Urinalysis, Routine w reflex microscopic     Status: Abnormal   Collection Time: 04/22/15  1:23 PM  Result Value Ref Range   Color, Urine YELLOW YELLOW   APPearance CLEAR CLEAR   Specific Gravity,  Urine 1.003 (L) 1.005 - 1.030   pH 7.5 5.0 - 8.0   Glucose, UA NEGATIVE NEGATIVE mg/dL   Hgb urine dipstick SMALL (A) NEGATIVE   Bilirubin Urine NEGATIVE NEGATIVE   Ketones, ur NEGATIVE NEGATIVE mg/dL   Protein, ur NEGATIVE NEGATIVE mg/dL   Urobilinogen, UA 0.2 0.0 - 1.0 mg/dL   Nitrite NEGATIVE NEGATIVE   Leukocytes, UA SMALL (A) NEGATIVE  Urine microscopic-add on     Status: None   Collection Time: 04/22/15  1:23 PM  Result  Value Ref Range   Squamous Epithelial / LPF RARE RARE   WBC, UA 11-20 <3 WBC/hpf   RBC / HPF 3-6 <3 RBC/hpf   Urine-Other FEW YEAST   Basic metabolic panel     Status: Abnormal   Collection Time: 04/22/15  4:09 PM  Result Value Ref Range   Sodium 134 (L) 135 - 145 mmol/L   Potassium 4.2 3.5 - 5.1 mmol/L   Chloride 97 (L) 101 - 111 mmol/L   CO2 29 22 - 32 mmol/L   Glucose, Bld 96 65 - 99 mg/dL   BUN 16 6 - 20 mg/dL   Creatinine, Ser 0.72 0.61 - 1.24 mg/dL   Calcium 8.9 8.9 - 10.3 mg/dL   GFR calc non Af Amer >60 >60 mL/min   GFR calc Af Amer >60 >60 mL/min    Comment: (NOTE) The eGFR has been calculated using the CKD EPI equation. This calculation has not been validated in all clinical situations. eGFR's persistently <60 mL/min signify possible Chronic Kidney Disease.    Anion gap 8 5 - 15  CBC with Differential     Status: Abnormal   Collection Time: 04/22/15  4:09 PM  Result Value Ref Range   WBC 5.9 4.0 - 10.5 K/uL   RBC 2.21 (L) 4.22 - 5.81 MIL/uL   Hemoglobin 6.5 (LL) 13.0 - 17.0 g/dL    Comment: SPECIMEN CHECKED FOR CLOTS REPEATED TO VERIFY CRITICAL RESULT CALLED TO, READ BACK BY AND VERIFIED WITH: LAUREN MCCLURE AT 0430 04/22/15. K.PAXTON    HCT 20.1 (L) 39.0 - 52.0 %   MCV 91.0 78.0 - 100.0 fL   MCH 29.4 26.0 - 34.0 pg   MCHC 32.3 30.0 - 36.0 g/dL   RDW 15.7 (H) 11.5 - 15.5 %   Platelets 229 150 - 400 K/uL   Neutrophils Relative % 58 43 - 77 %   Neutro Abs 3.4 1.7 - 7.7 K/uL   Lymphocytes Relative 32 12 - 46 %   Lymphs Abs 1.9 0.7 - 4.0 K/uL   Monocytes Relative 10 3 - 12 %   Monocytes Absolute 0.6 0.1 - 1.0 K/uL   Eosinophils Relative 1 0 - 5 %   Eosinophils Absolute 0.1 0.0 - 0.7 K/uL   Basophils Relative 0 0 - 1 %   Basophils Absolute 0.0 0.0 - 0.1 K/uL  POC occult blood, ED     Status: None   Collection Time: 04/22/15  5:27 PM  Result Value Ref Range   Fecal Occult Bld NEGATIVE NEGATIVE  Type and screen for Red Blood Exchange     Status: None  (Preliminary result)   Collection Time: 04/22/15  5:30 PM  Result Value Ref Range   ABO/RH(D) AB POS    Antibody Screen NEG    Sample Expiration 04/25/2015    Unit Number [id number]    Blood Component Type RED CELLS,LR    Unit division 00    Status of Unit ISSUED,FINAL  Transfusion Status OK TO TRANSFUSE    Crossmatch Result Compatible    Unit Number [id number]    Blood Component Type RED CELLS,LR    Unit division 00    Status of Unit ALLOCATED    Transfusion Status OK TO TRANSFUSE    Crossmatch Result Compatible    Unit Number [id number]    Blood Component Type RED CELLS,LR    Unit division 00    Status of Unit ALLOCATED    Transfusion Status OK TO TRANSFUSE    Crossmatch Result Compatible    Unit Number [id number]    Blood Component Type RED CELLS,LR    Unit division 00    Status of Unit ALLOCATED    Transfusion Status OK TO TRANSFUSE    Crossmatch Result Compatible   Vitamin B12     Status: Abnormal   Collection Time: 04/22/15  5:30 PM  Result Value Ref Range   Vitamin B-12 4823 (H) 180 - 914 pg/mL    Comment: (NOTE) This assay is not validated for testing neonatal or myeloproliferative syndrome specimens for Vitamin B12 levels. Performed at [facility]   Folate     Status: None   Collection Time: 04/22/15  5:30 PM  Result Value Ref Range   Folate 18.5 >5.9 ng/mL    Comment: Performed at [facility]  Iron and TIBC     Status: Abnormal   Collection Time: 04/22/15  5:30 PM  Result Value Ref Range   Iron 32 (L) 45 - 182 ug/dL   TIBC 209 (L) 250 - 450 ug/dL   Saturation Ratios 15 (L) 17.9 - 39.5 %   UIBC 177 ug/dL    Comment: Performed at [facility]  Ferritin     Status: None   Collection Time: 04/22/15  5:30 PM  Result Value Ref Range   Ferritin 210 24 - 336 ng/mL    Comment: Performed at [facility]  Reticulocytes     Status: Abnormal   Collection Time: 04/22/15  5:30 PM  Result Value Ref Range   Retic Ct Pct  5.8 (H) 0.4 - 3.1 %   RBC. 2.06 (L) 4.22 - 5.81 MIL/uL   Retic Count, Manual 119.5 19.0 - 186.0 K/uL  Hemoglobin and hematocrit, blood     Status: Abnormal   Collection Time: 04/22/15  8:20 PM  Result Value Ref Range   Hemoglobin 5.9 (LL) 13.0 - 17.0 g/dL    Comment: RESULT REPEATED AND VERIFIED CRITICAL RESULT CALLED TO, READ BACK BY AND VERIFIED WITH: MCCLURE,L AT 2045 ON [id number] BY HOOKER,B    HCT 18.3 (L) 39.0 - 52.0 %  Prepare RBC     Status: None   Collection Time: 04/22/15 10:30 PM  Result Value Ref Range   Order Confirmation ORDER PROCESSED BY BLOOD BANK   Hepatic function panel     Status: Abnormal   Collection Time: 04/23/15 12:15 AM  Result Value Ref Range   Total Protein 6.0 (L) 6.5 - 8.1 g/dL   Albumin 2.9 (L) 3.5 - 5.0 g/dL   AST 35 15 - 41 U/L   ALT 20 17 - 63 U/L   Alkaline Phosphatase 50 38 - 126 U/L   Total Bilirubin 1.1 0.3 - 1.2 mg/dL   Bilirubin, Direct 0.3 0.1 - 0.5 mg/dL   Indirect Bilirubin 0.8 0.3 - 0.9 mg/dL  Lactate dehydrogenase     Status: Abnormal   Collection Time: 04/23/15 12:15 AM  Result Value Ref Range  LDH 232 (H) 98 - 192 U/L  Lactic acid, plasma     Status: None   Collection Time: 04/23/15 12:15 AM  Result Value Ref Range   Lactic Acid, Venous 1.2 0.5 - 2.0 mmol/L  Magnesium     Status: Abnormal   Collection Time: 04/23/15  5:34 AM  Result Value Ref Range   Magnesium 1.6 (L) 1.7 - 2.4 mg/dL  Phosphorus     Status: None   Collection Time: 04/23/15  5:34 AM  Result Value Ref Range   Phosphorus 3.3 2.5 - 4.6 mg/dL  TSH     Status: None   Collection Time: 04/23/15  5:34 AM  Result Value Ref Range   TSH 1.352 0.350 - 4.500 uIU/mL  Comprehensive metabolic panel     Status: Abnormal   Collection Time: 04/23/15  5:34 AM  Result Value Ref Range   Sodium 136 135 - 145 mmol/L   Potassium 3.6 3.5 - 5.1 mmol/L   Chloride 102 101 - 111 mmol/L   CO2 25 22 - 32 mmol/L   Glucose, Bld 68 65 - 99 mg/dL   BUN 9 6 - 20 mg/dL   Creatinine, Ser  0.65 0.61 - 1.24 mg/dL   Calcium 8.4 (L) 8.9 - 10.3 mg/dL   Total Protein 6.1 (L) 6.5 - 8.1 g/dL   Albumin 2.7 (L) 3.5 - 5.0 g/dL   AST 35 15 - 41 U/L   ALT 18 17 - 63 U/L   Alkaline Phosphatase 49 38 - 126 U/L   Total Bilirubin 1.7 (H) 0.3 - 1.2 mg/dL   GFR calc non Af Amer >60 >60 mL/min   GFR calc Af Amer >60 >60 mL/min    Comment: (NOTE) The eGFR has been calculated using the CKD EPI equation. This calculation has not been validated in all clinical situations. eGFR's persistently <60 mL/min signify possible Chronic Kidney Disease.    Anion gap 9 5 - 15  CBC     Status: Abnormal   Collection Time: 04/23/15  5:34 AM  Result Value Ref Range   WBC 4.7 4.0 - 10.5 K/uL   RBC 2.27 (L) 4.22 - 5.81 MIL/uL   Hemoglobin 6.7 (LL) 13.0 - 17.0 g/dL    Comment: REPEATED TO VERIFY CRITICAL RESULT CALLED TO, READ BACK BY AND VERIFIED WITH: C.MARTIN,RN 04/23/15 0707 BY BSLADE    HCT 19.7 (L) 39.0 - 52.0 %   MCV 86.8 78.0 - 100.0 fL   MCH 29.1 26.0 - 34.0 pg   MCHC 33.5 30.0 - 36.0 g/dL   RDW 15.7 (H) 11.5 - 15.5 %   Platelets 209 150 - 400 K/uL  Type and screen     Status: None (Preliminary result)   Collection Time: 04/23/15  6:30 AM  Result Value Ref Range   ABO/RH(D) AB POS    Antibody Screen NEG    Sample Expiration 04/26/2015    Unit Number [id number]    Blood Component Type RED CELLS,LR    Unit division 00    Status of Unit ISSUED    Transfusion Status OK TO TRANSFUSE    Crossmatch Result Compatible    Unit Number [id number]    Blood Component Type RED CELLS,LR    Unit division 00    Status of Unit ISSUED    Transfusion Status OK TO TRANSFUSE    Crossmatch Result Compatible    Unit Number [id number]    Blood Component Type RED CELLS,LR    Unit division 00  Status of Unit ALLOCATED    Transfusion Status OK TO TRANSFUSE    Crossmatch Result Compatible   ABO/Rh     Status: None   Collection Time: 04/23/15  6:30 AM  Result Value Ref Range   ABO/RH(D) AB POS    Prepare RBC     Status: None   Collection Time: 04/23/15 10:11 AM  Result Value Ref Range   Order Confirmation ORDER PROCESSED BY BLOOD BANK   Prepare RBC     Status: None   Collection Time: 04/23/15  3:58 PM  Result Value Ref Range   Order Confirmation ORDER PROCESSED BY BLOOD BANK    Ct Abdomen Pelvis Wo Contrast  04/22/2015   CLINICAL DATA:  81 year old male with anemia and UTI. Decreased urine output. Evaluate for retroperitoneal bleed.  EXAM: CT ABDOMEN AND PELVIS WITHOUT CONTRAST  TECHNIQUE: Multidetector CT imaging of the abdomen and pelvis was performed following the standard protocol without IV contrast.  COMPARISON:  No prior CT.  FINDINGS: The heart is enlarged. Volume loss in the lung bases with likely atelectasis in the left greater than right lower lobe.  No stranding in the retroperitoneum to suggest retroperitoneal bleed.  Evaluation of the solid and hollow viscera is limited given lack of contrast, paucity of intra-abdominal fat, and arms down positioning.  Homogeneous attenuation to the liver, no focal lesion allowing for noncontrast exam. Gallbladder is decompressed. The unenhanced spleen is normal in size. The pancreas is not well-defined, no definite peripancreatic inflammatory change. The adrenal glands are not well defined. There is no hydronephrosis. No renal stones. Limited bowel assessment, no small bowel dilatation. There is equivocal gastric wall thickening. Moderate stool throughout the colon. Suspect air-filled appendix in the right lower quadrant.  Diffuse atherosclerosis of the abdominal aorta without aneurysm. Decreased density of blood pool consistent with anemia.  Fluid-filled bowel loops in the pelvis. Urinary bladder is distended with irregular wall, Foley catheter is in place. Prostate gland appears enlarged.  No definite free air free fluid in the abdomen or pelvis. Mild whole body wall edema.  There is diffuse bony under mineralization. Sequela of right superior  inferior pubic rami fractures. Compression deformity of L1.  IMPRESSION: 1. No evidence of retroperitoneal bleed. 2. Urinary bladder is distended with irregular wall, Foley catheter in place. This may be due to chronic bladder outlet obstruction, prostate gland enlarged. 3. Evaluation of the remaining intra-abdominal viscera is limited given lack contrast and paucity of intra-abdominal fat. Equivocal gastric wall thickening. There is dense atherosclerosis of the aorta and its branches without aneurysm. Cardiomegaly. 4. Whole body wall edema, suspect third-spacing.   Electronically Signed   By: Jeb Levering M.D.   On: 04/22/2015 22:20   Dg Chest 2 View  04/22/2015   CLINICAL DATA:  Fever.  EXAM: CHEST  2 VIEW  COMPARISON:  04/18/2015  FINDINGS: Dissemination is quite limited due to the position of the patient has arms which are obscuring most of the chest. The heart is enlarged. There is tortuosity and calcification of the thoracic aorta. The lungs are grossly clear.  IMPRESSION: Very limited examination.  No obvious/gross acute pulmonary process   Electronically Signed   By: Marijo Sanes M.D.   On: 04/22/2015 16:58   Review of Systems  Constitutional: Positive for malaise/fatigue. Negative for fever, chills, weight loss and diaphoresis.  Eyes: Negative.   Gastrointestinal: Positive for blood in stool and melena. Negative for heartburn, nausea, vomiting, abdominal pain and diarrhea.  Skin: Negative.  Neurological: Positive for weakness.  Endo/Heme/Allergies: Negative.   Psychiatric/Behavioral: Positive for memory loss.   Blood pressure 156/80, pulse 62, temperature 98.1 F (36.7 C), temperature source Oral, resp. rate 14, weight 56.79 kg (125 lb 3.2 oz), SpO2 100 %. Physical Exam  Assessment/Plan: 1) Melena and anemia; will schedule patient for an EGD tomorrow. Serial  CBC's to be monitored. 2) Failure to thrive/severe malnutrition. 3) Alzheimer's dementia. 4) HTN/Hyperlipidemia. 5)  History of CVA. 6) DDD/Gout.  Ysela Hettinger 04/23/2015, 6:15 PM

## 2015-04-23 NOTE — Progress Notes (Signed)
Pt has not been able to respond to any questions this morning, but just woke up while I was in room. Asked pt if he ws in pain, pt stated "no." Asked pt if he was comfortable, pt stated "yes." Will continue to monitor.

## 2015-04-23 NOTE — ED Notes (Signed)
Awaiting for [facility] arrival.

## 2015-04-23 NOTE — Progress Notes (Signed)
Initial Nutrition Assessment  DOCUMENTATION CODES:  Severe malnutrition in context of chronic illness, Underweight  INTERVENTION:   (Diet advancement as medically appropriate per MD.)  NUTRITION DIAGNOSIS:  Malnutrition related to chronic illness as evidenced by severe depletion of body fat, severe depletion of muscle mass, percent weight loss.  GOAL:  Patient will meet greater than or equal to 90% of their needs  MONITOR:  Diet advancement, Weight trends, Labs, I & O's  REASON FOR ASSESSMENT:  Low Braden    ASSESSMENT: Pt with PMH of IBS, diverticulosis, alzheimers, HTN, dementia, parkinsonian syndrome. Presents with dehydration, anemia, and an episode of large tarry stool.   Pt is currently NPO. Pt nonverbal during time of visit. No family at bedside. Unable to obtain nutrition hx. Per Epic weight records, pt with a 21.8% weight loss in 8 months.   Nutrition-Focused physical exam completed. Findings are severe fat depletion, severe muscle depletion, and moderate edema.   Labs and medications reviewed.   Height:  Ht Readings from Last 1 Encounters:  04/18/15 6\' 2"  (1.88 m)    Weight:  Wt Readings from Last 1 Encounters:  04/23/15 125 lb 3.2 oz (56.79 kg)    Ideal Body Weight:  86.4 kg  Wt Readings from Last 10 Encounters:  04/23/15 125 lb 3.2 oz (56.79 kg)  04/03/15 124 lb 12.5 oz (56.6 kg)  03/25/15 115 lb (52.164 kg)  01/11/15 115 lb (52.164 kg)  11/22/14 115 lb (52.164 kg)  06/28/14 160 lb 3.3 oz (72.67 kg)  04/20/14 150 lb (68.04 kg)  03/31/14 141 lb 8 oz (64.184 kg)  03/29/14 158 lb (71.668 kg)  03/28/14 153 lb 11.2 oz (69.718 kg)    BMI:  Body mass index is 16.07 kg/(m^2). Underweight  Estimated Nutritional Needs:  Kcal:  1600-1700  Protein:  75-85 grams  Fluid:  1.6 - 1.7 L/day  Skin:   Stage I pressure ulcer on sacrum, +2 LUE, non-pitting RLE, +1 LLE edema  Diet Order:  Diet NPO time specified  EDUCATION NEEDS:  Education needs  no appropriate at this time   Intake/Output Summary (Last 24 hours) at 04/23/15 1526 Last data filed at 04/23/15 1429  Gross per 24 hour  Intake   1066 ml  Output   2025 ml  Net   -959 ml    Last BM:  PTA  Kallie Locks, MS, RD, LDN Pager # [phone] After hours/ weekend pager # [phone]

## 2015-04-23 NOTE — Progress Notes (Signed)
Patient ID: Ricky [name], male   DOB: [birth year], 81 y.o.   MRN: [id number]  [facility] PROGRESS NOTE  Ricky [name] WPY:[id number] DOB: [birth year] DOA: 04/22/2015 PCP: Hollace Kinnier, DO   Brief narrative:    81 y.o. male with a PMH of progressive supranuclear palsy, hypertension, and hypothyroidism, recent hospitalization 03/25/15-03/30/15 for treatment of UTI, and again from 5/17 - 5/24 for UTI and dehydration, note presented to [facility] ED with several days duration of progressively worsening lethargy and several episodes of black tarry stools. Of note, pt recently diagnosed with left upper extremity DVT and was started on Xarelto.  In ED, pt noted to have Hg 6.5 with repeat Hg 5.9, also upper extremity doppler positive for DVT.   Assessment/Plan:    Active Problems:   Acute on chronic blood loss anemia secondary to GI bleed, IDA - exacerbated by [facility] with Xarelto which has been on hold since admission - GI team consulted, pt has been NPO since admission in an anticipation of an intervention - r2 U PRBC transfused on admission, will go ahead and transfuse 2 more units  - repeat CBC In AM and follow up on GI team recommendations     Acute encephalopathy - secondary to the above imposed on chronic progressive failure to thrive and parkinsonism - still unresponsive and non verbal this AM, not following any commands  - continue supportive care with blood transfusion and plan on endoscopy per GI team    Dementia with chronic functional quadriplegia in pt with progressive supranuclear palsy with parkinsonism  - pt bed bound, DNR status confirmed - allow to eat once pt seen by GI team, as pt able to tolerate  - please note that PCT has seen pt on last admission     LUE DVT - holding Xarelto - holding aspirin    Hypomagnesemia - supplement and repeat Mg level in AM    Severe PCM - appreciate nutritionist input  DVT prophylaxis - SCD's  Code Status: DNR Family Communication:   No family at bedside  Disposition Plan: Home when stable.   IV access:  Peripheral IV  Procedures and diagnostic studies:    Ct Abdomen Pelvis Wo Contrast 04/22/2015   No evidence of retroperitoneal bleed. 2. Urinary bladder is distended with irregular wall, Foley catheter in place. This may be due to chronic bladder outlet obstruction, prostate gland enlarged. 3. Evaluation of the remaining intra-abdominal viscera is limited given lack contrast and paucity of intra-abdominal fat. Equivocal gastric wall thickening. There is dense atherosclerosis of the aorta and its branches without aneurysm. Cardiomegaly. 4. Whole body wall edema, suspect third-spacing.    Dg Chest 1 View 04/19/2015   No acute cardiopulmonary abnormality seen.     Dg Chest 2 View 04/22/2015 Very limited examination.  No obvious/gross acute pulmonary process   Medical Consultants:  GI  Other Consultants:  None  IAnti-Infectives:   None  Faye Ramsay, MD  [facility] Pager [phone]  If 7PM-7AM, please contact night-coverage [email or web address] Password [email or web address] 04/23/2015, 3:15 PM   LOS: 1 day   HPI/Subjective: No events overnight.   Objective: Filed Vitals:   04/23/15 0847 04/23/15 1107 04/23/15 1136 04/23/15 1429  BP: 123/59 140/72 150/67 155/72  Pulse: 57 60 55 53  Temp: 98.5 F (36.9 C) 97.8 F (36.6 C) 97.5 F (36.4 C) 98 F (36.7 C)  TempSrc: Axillary Oral Oral Oral  Resp: 16 15 14 14   Weight:      SpO2: 99% 97% 98% 98%  Intake/Output Summary (Last 24 hours) at 04/23/15 1515 Last data filed at 04/23/15 1429  Gross per 24 hour  Intake   1066 ml  Output   2025 ml  Net   -959 ml    Exam:   General:  Pt is lethargic, non responsive, sitting upright, severely contracted   Cardiovascular: Regular rate and rhythm, no rubs, no gallops  Respiratory: Clear to auscultation bilaterally, no wheezing, diminished breath sounds at bases   Abdomen: Soft, non tender, non distended, bowel sounds present, no  guarding  Extremities: upper and lower extremity edema bilaterally, pulses DP and PT palpable bilaterally   Data Reviewed: Basic Metabolic Panel:  Recent Labs Lab 04/22/15 1609 04/23/15 0534  NA 134* 136  K 4.2 3.6  CL 97* 102  CO2 29 25  GLUCOSE 96 68  BUN 16 9  CREATININE 0.72 0.65  CALCIUM 8.9 8.4*  MG  --  1.6*  PHOS  --  3.3   Liver Function Tests:  Recent Labs Lab 04/23/15 0015 04/23/15 0534  AST 35 35  ALT 20 18  ALKPHOS 50 49  BILITOT 1.1 1.7*  PROT 6.0* 6.1*  ALBUMIN 2.9* 2.7*   CBC:  Recent Labs Lab 04/22/15 1609 04/22/15 2020 04/23/15 0534  WBC 5.9  --  4.7  NEUTROABS 3.4  --   --   HGB 6.5* 5.9* 6.7*  HCT 20.1* 18.3* 19.7*  MCV 91.0  --  86.8  PLT 229  --  209   Scheduled Meds: . sodium chloride   Intravenous Once  . levothyroxine  25 mcg Oral QAC breakfast  . memantine  10 mg Oral BID  . pantoprazole (PROTONIX) IV  40 mg Intravenous Q12H  . sodium chloride  3 mL Intravenous Q12H   Continuous Infusions:

## 2015-04-23 NOTE — ED Notes (Signed)
As this RN was away picking up blood from blood bank, upon arrival back to room family member [name] and male visitor were taking pictures of all documents that were left at the bedside (blood consent, ED blood audit form, blood policy packet).

## 2015-04-24 ENCOUNTER — Telehealth: Payer: Self-pay | Admitting: Internal Medicine

## 2015-04-24 ENCOUNTER — Encounter (HOSPITAL_COMMUNITY): Payer: Self-pay

## 2015-04-24 ENCOUNTER — Encounter (HOSPITAL_COMMUNITY)
Admission: EM | Disposition: A | Discharge status: Home / Self Care | Payer: Self-pay | Source: Home / Self Care | Attending: Internal Medicine

## 2015-04-24 HISTORY — PX: ESOPHAGOGASTRODUODENOSCOPY: SHX5428

## 2015-04-24 LAB — TYPE AND SCREEN
ABO/RH(D): AB POS
Antibody Screen: NEGATIVE
UNIT DIVISION: 0
UNIT DIVISION: 0
Unit division: 0

## 2015-04-24 LAB — URINE CULTURE
Colony Count: NO GROWTH
Culture: NO GROWTH

## 2015-04-24 LAB — BASIC METABOLIC PANEL
ANION GAP: 12 (ref 5–15)
BUN: 7 mg/dL (ref 6–20)
CO2: 23 mmol/L (ref 22–32)
CREATININE: 0.68 mg/dL (ref 0.61–1.24)
Calcium: 8.5 mg/dL — ABNORMAL LOW (ref 8.9–10.3)
Chloride: 104 mmol/L (ref 101–111)
GFR calc Af Amer: >60 mL/min (ref >60)
GFR calc non Af Amer: >60 mL/min (ref >60)
GLUCOSE: 64 mg/dL — AB (ref 65–99)
Potassium: 3.4 mmol/L — ABNORMAL LOW (ref 3.5–5.1)
SODIUM: 139 mmol/L (ref 135–145)

## 2015-04-24 LAB — CBC
HEMATOCRIT: 31.5 % — AB (ref 39.0–52.0)
HEMOGLOBIN: 10.4 g/dL — AB (ref 13.0–17.0)
MCH: 27.9 pg (ref 26.0–34.0)
MCHC: 33 g/dL (ref 30.0–36.0)
MCV: 84.5 fL (ref 78.0–100.0)
Platelets: 205 10*3/uL (ref 150–400)
RBC: 3.73 MIL/uL — ABNORMAL LOW (ref 4.22–5.81)
RDW: 16.8 % — ABNORMAL HIGH (ref 11.5–15.5)
WBC: 4.6 10*3/uL (ref 4.0–10.5)

## 2015-04-24 LAB — MAGNESIUM: MAGNESIUM: 1.9 mg/dL (ref 1.7–2.4)

## 2015-04-24 SURGERY — EGD (ESOPHAGOGASTRODUODENOSCOPY)
Anesthesia: Moderate Sedation

## 2015-04-24 MED ORDER — POTASSIUM CHLORIDE 10 MEQ/100ML IV SOLN
10.0000 meq | Freq: Once | INTRAVENOUS | Status: AC
  Administered 2015-04-24: 10 meq via INTRAVENOUS
  Filled 2015-04-24: qty 100

## 2015-04-24 MED ORDER — CETYLPYRIDINIUM CHLORIDE 0.05 % MT LIQD
7.0000 mL | Freq: Two times a day (BID) | OROMUCOSAL | Status: DC
  Administered 2015-04-24 – 2015-04-25 (×3): 7 mL via OROMUCOSAL

## 2015-04-24 MED ORDER — FENTANYL CITRATE (PF) 100 MCG/2ML IJ SOLN
INTRAMUSCULAR | Status: AC
  Filled 2015-04-24: qty 2

## 2015-04-24 MED ORDER — POTASSIUM CHLORIDE 10 MEQ/100ML IV SOLN
10.0000 meq | INTRAVENOUS | Status: AC
  Administered 2015-04-24 (×3): 10 meq via INTRAVENOUS
  Filled 2015-04-24 (×4): qty 100

## 2015-04-24 MED ORDER — MIDAZOLAM HCL 5 MG/ML IJ SOLN
INTRAMUSCULAR | Status: AC
  Filled 2015-04-24: qty 2

## 2015-04-24 MED ORDER — FENTANYL CITRATE (PF) 100 MCG/2ML IJ SOLN
INTRAMUSCULAR | Status: DC | PRN
  Administered 2015-04-24: 12.5 ug via INTRAVENOUS

## 2015-04-24 MED ORDER — MIDAZOLAM HCL 10 MG/2ML IJ SOLN
INTRAMUSCULAR | Status: DC | PRN
Start: 2015-04-24 — End: 2015-04-24
  Administered 2015-04-24: 1 mg via INTRAVENOUS

## 2015-04-24 MED ORDER — STARCH (THICKENING) PO POWD
ORAL | Status: DC | PRN
  Filled 2015-04-24: qty 227

## 2015-04-24 NOTE — H&P (View-Only) (Signed)
Reason for Consult: Melena and anemia. Referring Physician: THP  Ricky [name] is an 81 y.o. male.  HPI: 81 year old black male with multiple medical issues listed below, recently discharged from the hospital after being treated for a UTI, readmitted with lethargy, melena and FTT. Workup in the ER revealed a drop in the hemoglobin from 11 gm/dl to 6.5 gm/dl. He is unable to provide any details on his history. Details mostly procured from the chart.   Past Medical History  Diagnosis Date  . IBS (irritable bowel syndrome)   . Diverticulosis   . Cataract   . OAB (overactive bladder)   . Macular degeneration   . Hypertension   . Parkinsonian syndrome   . Syncope, vasovagal   . 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 Unspecified transient cerebral ischemia   . Unspecified urinary incontinence   . Hypothyroidism   . Anemia, unspecified   . Depression   . Alzheimer's disease   . Gout, unspecified   . Thoracic or lumbosacral neuritis or radiculitis, unspecified   . Progressive supranuclear palsy   . Paralysis agitans   . Pneumonitis due to inhalation of food or vomitus   . Cellulitis and abscess of oral soft tissues   . Unspecified hypothyroidism   . Pneumonia, organism unspecified   . Pseudomonas infection in conditions classified elsewhere and of unspecified site   . Urinary tract infection, site not specified   . Osteoarthrosis, unspecified whether generalized or localized, unspecified site   . Osteoporosis, unspecified   . Gout, unspecified   . Thoracic or lumbosacral neuritis or radiculitis, unspecified   . Stroke    Past Surgical History  Procedure Laterality Date  . Esophagogastroduodenoscopy    . Eye surgery  2006    LEFT CATARACT   Family History  Problem Relation Age of Onset  . Cancer Mother   . Stroke Father   . Liver disease Father   . Kidney disease Brother   . Liver disease Brother   . Emphysema Brother    Social History:   reports that he has quit smoking. He has never used smokeless tobacco. He reports that he does not drink alcohol or use illicit drugs.  Allergies:  Allergies  Allergen Reactions  . Other Other (See Comments)    Dairy products- cause swallowing difficulty    Medications: I have reviewed the patient's current medications.  Results for orders placed or performed during the hospital encounter of 04/22/15 (from the past 48 hour(s))  Urinalysis, Routine w reflex microscopic     Status: Abnormal   Collection Time: 04/22/15  1:23 PM  Result Value Ref Range   Color, Urine YELLOW YELLOW   APPearance CLEAR CLEAR   Specific Gravity, Urine 1.003 (L) 1.005 - 1.030   pH 7.5 5.0 - 8.0   Glucose, UA NEGATIVE NEGATIVE mg/dL   Hgb urine dipstick SMALL (A) NEGATIVE   Bilirubin Urine NEGATIVE NEGATIVE   Ketones, ur NEGATIVE NEGATIVE mg/dL   Protein, ur NEGATIVE NEGATIVE mg/dL   Urobilinogen, UA 0.2 0.0 - 1.0 mg/dL   Nitrite NEGATIVE NEGATIVE   Leukocytes, UA SMALL (A) NEGATIVE  Urine microscopic-add on     Status: None   Collection Time: 04/22/15  1:23 PM  Result Value Ref Range   Squamous Epithelial / LPF RARE RARE   WBC, UA 11-20 <3 WBC/hpf   RBC / HPF 3-6 <3 RBC/hpf   Urine-Other FEW YEAST   Basic metabolic panel  Status: Abnormal   Collection Time: 04/22/15  4:09 PM  Result Value Ref Range   Sodium 134 (L) 135 - 145 mmol/L   Potassium 4.2 3.5 - 5.1 mmol/L   Chloride 97 (L) 101 - 111 mmol/L   CO2 29 22 - 32 mmol/L   Glucose, Bld 96 65 - 99 mg/dL   BUN 16 6 - 20 mg/dL   Creatinine, Ser 0.72 0.61 - 1.24 mg/dL   Calcium 8.9 8.9 - 10.3 mg/dL   GFR calc non Af Amer >60 >60 mL/min   GFR calc Af Amer >60 >60 mL/min    Comment: (NOTE) The eGFR has been calculated using the CKD EPI equation. This calculation has not been validated in all clinical situations. eGFR's persistently <60 mL/min signify possible Chronic Kidney Disease.    Anion gap 8 5 - 15  CBC with Differential     Status:  Abnormal   Collection Time: 04/22/15  4:09 PM  Result Value Ref Range   WBC 5.9 4.0 - 10.5 K/uL   RBC 2.21 (L) 4.22 - 5.81 MIL/uL   Hemoglobin 6.5 (LL) 13.0 - 17.0 g/dL    Comment: SPECIMEN CHECKED FOR CLOTS REPEATED TO VERIFY CRITICAL RESULT CALLED TO, READ BACK BY AND VERIFIED WITH: LAUREN MCCLURE AT 0430 04/22/15. K.PAXTON    HCT 20.1 (L) 39.0 - 52.0 %   MCV 91.0 78.0 - 100.0 fL   MCH 29.4 26.0 - 34.0 pg   MCHC 32.3 30.0 - 36.0 g/dL   RDW 15.7 (H) 11.5 - 15.5 %   Platelets 229 150 - 400 K/uL   Neutrophils Relative % 58 43 - 77 %   Neutro Abs 3.4 1.7 - 7.7 K/uL   Lymphocytes Relative 32 12 - 46 %   Lymphs Abs 1.9 0.7 - 4.0 K/uL   Monocytes Relative 10 3 - 12 %   Monocytes Absolute 0.6 0.1 - 1.0 K/uL   Eosinophils Relative 1 0 - 5 %   Eosinophils Absolute 0.1 0.0 - 0.7 K/uL   Basophils Relative 0 0 - 1 %   Basophils Absolute 0.0 0.0 - 0.1 K/uL  POC occult blood, ED     Status: None   Collection Time: 04/22/15  5:27 PM  Result Value Ref Range   Fecal Occult Bld NEGATIVE NEGATIVE  Type and screen for Red Blood Exchange     Status: None (Preliminary result)   Collection Time: 04/22/15  5:30 PM  Result Value Ref Range   ABO/RH(D) AB POS    Antibody Screen NEG    Sample Expiration 04/25/2015    Unit Number [id number]    Blood Component Type RED CELLS,LR    Unit division 00    Status of Unit ISSUED,FINAL    Transfusion Status OK TO TRANSFUSE    Crossmatch Result Compatible    Unit Number [id number]    Blood Component Type RED CELLS,LR    Unit division 00    Status of Unit ALLOCATED    Transfusion Status OK TO TRANSFUSE    Crossmatch Result Compatible    Unit Number [id number]    Blood Component Type RED CELLS,LR    Unit division 00    Status of Unit ALLOCATED    Transfusion Status OK TO TRANSFUSE    Crossmatch Result Compatible    Unit Number [id number]    Blood Component Type RED CELLS,LR    Unit division 00    Status of Unit ALLOCATED    Transfusion  Status OK TO TRANSFUSE  Crossmatch Result Compatible   Vitamin B12     Status: Abnormal   Collection Time: 04/22/15  5:30 PM  Result Value Ref Range   Vitamin B-12 4823 (H) 180 - 914 pg/mL    Comment: (NOTE) This assay is not validated for testing neonatal or myeloproliferative syndrome specimens for Vitamin B12 levels. Performed at [facility]   Folate     Status: None   Collection Time: 04/22/15  5:30 PM  Result Value Ref Range   Folate 18.5 >5.9 ng/mL    Comment: Performed at [facility]  Iron and TIBC     Status: Abnormal   Collection Time: 04/22/15  5:30 PM  Result Value Ref Range   Iron 32 (L) 45 - 182 ug/dL   TIBC 209 (L) 250 - 450 ug/dL   Saturation Ratios 15 (L) 17.9 - 39.5 %   UIBC 177 ug/dL    Comment: Performed at [facility]  Ferritin     Status: None   Collection Time: 04/22/15  5:30 PM  Result Value Ref Range   Ferritin 210 24 - 336 ng/mL    Comment: Performed at [facility]  Reticulocytes     Status: Abnormal   Collection Time: 04/22/15  5:30 PM  Result Value Ref Range   Retic Ct Pct 5.8 (H) 0.4 - 3.1 %   RBC. 2.06 (L) 4.22 - 5.81 MIL/uL   Retic Count, Manual 119.5 19.0 - 186.0 K/uL  Hemoglobin and hematocrit, blood     Status: Abnormal   Collection Time: 04/22/15  8:20 PM  Result Value Ref Range   Hemoglobin 5.9 (LL) 13.0 - 17.0 g/dL    Comment: RESULT REPEATED AND VERIFIED CRITICAL RESULT CALLED TO, READ BACK BY AND VERIFIED WITH: MCCLURE,L AT 2045 ON [id number] BY HOOKER,B    HCT 18.3 (L) 39.0 - 52.0 %  Prepare RBC     Status: None   Collection Time: 04/22/15 10:30 PM  Result Value Ref Range   Order Confirmation ORDER PROCESSED BY BLOOD BANK   Hepatic function panel     Status: Abnormal   Collection Time: 04/23/15 12:15 AM  Result Value Ref Range   Total Protein 6.0 (L) 6.5 - 8.1 g/dL   Albumin 2.9 (L) 3.5 - 5.0 g/dL   AST 35 15 - 41 U/L   ALT 20 17 - 63 U/L   Alkaline Phosphatase 50 38 - 126 U/L   Total  Bilirubin 1.1 0.3 - 1.2 mg/dL   Bilirubin, Direct 0.3 0.1 - 0.5 mg/dL   Indirect Bilirubin 0.8 0.3 - 0.9 mg/dL  Lactate dehydrogenase     Status: Abnormal   Collection Time: 04/23/15 12:15 AM  Result Value Ref Range   LDH 232 (H) 98 - 192 U/L  Lactic acid, plasma     Status: None   Collection Time: 04/23/15 12:15 AM  Result Value Ref Range   Lactic Acid, Venous 1.2 0.5 - 2.0 mmol/L  Magnesium     Status: Abnormal   Collection Time: 04/23/15  5:34 AM  Result Value Ref Range   Magnesium 1.6 (L) 1.7 - 2.4 mg/dL  Phosphorus     Status: None   Collection Time: 04/23/15  5:34 AM  Result Value Ref Range   Phosphorus 3.3 2.5 - 4.6 mg/dL  TSH     Status: None   Collection Time: 04/23/15  5:34 AM  Result Value Ref Range   TSH 1.352 0.350 - 4.500 uIU/mL  Comprehensive metabolic panel  Status: Abnormal   Collection Time: 04/23/15  5:34 AM  Result Value Ref Range   Sodium 136 135 - 145 mmol/L   Potassium 3.6 3.5 - 5.1 mmol/L   Chloride 102 101 - 111 mmol/L   CO2 25 22 - 32 mmol/L   Glucose, Bld 68 65 - 99 mg/dL   BUN 9 6 - 20 mg/dL   Creatinine, Ser 0.65 0.61 - 1.24 mg/dL   Calcium 8.4 (L) 8.9 - 10.3 mg/dL   Total Protein 6.1 (L) 6.5 - 8.1 g/dL   Albumin 2.7 (L) 3.5 - 5.0 g/dL   AST 35 15 - 41 U/L   ALT 18 17 - 63 U/L   Alkaline Phosphatase 49 38 - 126 U/L   Total Bilirubin 1.7 (H) 0.3 - 1.2 mg/dL   GFR calc non Af Amer >60 >60 mL/min   GFR calc Af Amer >60 >60 mL/min    Comment: (NOTE) The eGFR has been calculated using the CKD EPI equation. This calculation has not been validated in all clinical situations. eGFR's persistently <60 mL/min signify possible Chronic Kidney Disease.    Anion gap 9 5 - 15  CBC     Status: Abnormal   Collection Time: 04/23/15  5:34 AM  Result Value Ref Range   WBC 4.7 4.0 - 10.5 K/uL   RBC 2.27 (L) 4.22 - 5.81 MIL/uL   Hemoglobin 6.7 (LL) 13.0 - 17.0 g/dL    Comment: REPEATED TO VERIFY CRITICAL RESULT CALLED TO, READ BACK BY AND VERIFIED  WITH: C.MARTIN,RN 04/23/15 0707 BY BSLADE    HCT 19.7 (L) 39.0 - 52.0 %   MCV 86.8 78.0 - 100.0 fL   MCH 29.1 26.0 - 34.0 pg   MCHC 33.5 30.0 - 36.0 g/dL   RDW 15.7 (H) 11.5 - 15.5 %   Platelets 209 150 - 400 K/uL  Type and screen     Status: None (Preliminary result)   Collection Time: 04/23/15  6:30 AM  Result Value Ref Range   ABO/RH(D) AB POS    Antibody Screen NEG    Sample Expiration 04/26/2015    Unit Number [id number]    Blood Component Type RED CELLS,LR    Unit division 00    Status of Unit ISSUED    Transfusion Status OK TO TRANSFUSE    Crossmatch Result Compatible    Unit Number [id number]    Blood Component Type RED CELLS,LR    Unit division 00    Status of Unit ISSUED    Transfusion Status OK TO TRANSFUSE    Crossmatch Result Compatible    Unit Number [id number]    Blood Component Type RED CELLS,LR    Unit division 00    Status of Unit ALLOCATED    Transfusion Status OK TO TRANSFUSE    Crossmatch Result Compatible   ABO/Rh     Status: None   Collection Time: 04/23/15  6:30 AM  Result Value Ref Range   ABO/RH(D) AB POS   Prepare RBC     Status: None   Collection Time: 04/23/15 10:11 AM  Result Value Ref Range   Order Confirmation ORDER PROCESSED BY BLOOD BANK   Prepare RBC     Status: None   Collection Time: 04/23/15  3:58 PM  Result Value Ref Range   Order Confirmation ORDER PROCESSED BY BLOOD BANK    Ct Abdomen Pelvis Wo Contrast  04/22/2015   CLINICAL DATA:  81 year old male with anemia and UTI. Decreased urine output. Evaluate for  retroperitoneal bleed.  EXAM: CT ABDOMEN AND PELVIS WITHOUT CONTRAST  TECHNIQUE: Multidetector CT imaging of the abdomen and pelvis was performed following the standard protocol without IV contrast.  COMPARISON:  No prior CT.  FINDINGS: The heart is enlarged. Volume loss in the lung bases with likely atelectasis in the left greater than right lower lobe.  No stranding in the retroperitoneum to suggest retroperitoneal  bleed.  Evaluation of the solid and hollow viscera is limited given lack of contrast, paucity of intra-abdominal fat, and arms down positioning.  Homogeneous attenuation to the liver, no focal lesion allowing for noncontrast exam. Gallbladder is decompressed. The unenhanced spleen is normal in size. The pancreas is not well-defined, no definite peripancreatic inflammatory change. The adrenal glands are not well defined. There is no hydronephrosis. No renal stones. Limited bowel assessment, no small bowel dilatation. There is equivocal gastric wall thickening. Moderate stool throughout the colon. Suspect air-filled appendix in the right lower quadrant.  Diffuse atherosclerosis of the abdominal aorta without aneurysm. Decreased density of blood pool consistent with anemia.  Fluid-filled bowel loops in the pelvis. Urinary bladder is distended with irregular wall, Foley catheter is in place. Prostate gland appears enlarged.  No definite free air free fluid in the abdomen or pelvis. Mild whole body wall edema.  There is diffuse bony under mineralization. Sequela of right superior inferior pubic rami fractures. Compression deformity of L1.  IMPRESSION: 1. No evidence of retroperitoneal bleed. 2. Urinary bladder is distended with irregular wall, Foley catheter in place. This may be due to chronic bladder outlet obstruction, prostate gland enlarged. 3. Evaluation of the remaining intra-abdominal viscera is limited given lack contrast and paucity of intra-abdominal fat. Equivocal gastric wall thickening. There is dense atherosclerosis of the aorta and its branches without aneurysm. Cardiomegaly. 4. Whole body wall edema, suspect third-spacing.   Electronically Signed   By: Jeb Levering M.D.   On: 04/22/2015 22:20   Dg Chest 2 View  04/22/2015   CLINICAL DATA:  Fever.  EXAM: CHEST  2 VIEW  COMPARISON:  04/18/2015  FINDINGS: Dissemination is quite limited due to the position of the patient has arms which are obscuring  most of the chest. The heart is enlarged. There is tortuosity and calcification of the thoracic aorta. The lungs are grossly clear.  IMPRESSION: Very limited examination.  No obvious/gross acute pulmonary process   Electronically Signed   By: Marijo Sanes M.D.   On: 04/22/2015 16:58   Review of Systems  Constitutional: Positive for malaise/fatigue. Negative for fever, chills and diaphoresis.  HENT: Negative.   Eyes: Negative.   Respiratory: Negative.   Cardiovascular: Negative.   Gastrointestinal: Positive for constipation, blood in stool and melena. Negative for heartburn, nausea, vomiting and abdominal pain.  Genitourinary: Positive for frequency.  Musculoskeletal: Positive for back pain and joint pain.  Skin: Negative.   Neurological: Positive for weakness.  Psychiatric/Behavioral: Positive for memory loss.   Blood pressure 156/80, pulse 62, temperature 98.1 F (36.7 C), temperature source Oral, resp. rate 14, weight 56.79 kg (125 lb 3.2 oz), SpO2 100 %. Physical Exam  Constitutional: He appears listless. He appears cachectic. He has a sickly appearance.  Cardiovascular: Regular rhythm.   GI: Soft. Bowel sounds are normal. There is tenderness in the epigastric area.  Neurological: He appears listless.   Assessment/Plan: Assessment/Plan: 1) Melena and anemia-?epigastric pain-will schedule patient for an EGD tomorrow. Serial  CBC's to be monitored. 2) Failure to thrive. 3) Alzheimer's dementia. 4) HTN/Hyperlipidemia. 5) History of  CVA. 6) DDD/Gout.  [name] Boulay 04/23/2015, 7:16 PM

## 2015-04-24 NOTE — Progress Notes (Signed)
Called patient's daughter,Lisa to get consent for EGD.Daughter wants to talk to MD first before signing consent because she has questions. Ricky [name], Wonda Cheng, Therapist, sports

## 2015-04-24 NOTE — Telephone Encounter (Signed)
Will forward to Dr. Harrington Challenger to make her aware.

## 2015-04-24 NOTE — Progress Notes (Addendum)
Patient ID: Ricky [name], male   DOB: [birth year], 81 y.o.   MRN: [id number]  [facility] PROGRESS NOTE  Ricky [name] NLZ:[id number] DOB: 07/31/1917 DOA: 04/22/2015 PCP: Hollace Kinnier, DO   Brief narrative:    81 y.o. male with a PMH of progressive supranuclear palsy, hypertension, and hypothyroidism, recent hospitalization 03/25/15-03/30/15 for treatment of UTI, and again from 5/17 - 5/24 for UTI and dehydration, note presented to [facility] ED with several days duration of progressively worsening lethargy and several episodes of black tarry stools. Of note, pt recently diagnosed with left upper extremity DVT and was started on Xarelto.  In ED, pt noted to have Hg 6.5 with repeat Hg 5.9, also upper extremity doppler positive for DVT.   Assessment/Plan:    Active Problems:   Acute on chronic blood loss anemia secondary to GI bleed, IDA - exacerbated by [facility] with Xarelto which has been on hold since admission - GI team consulted, pt has been NPO since admission in an anticipation of an EGD this AM - 2 U PRBC transfused on admission, 2 more U transfused 6/6 with appropriate increase in post transfusion Hg from 6.7 --> 10.4 - repeat CBC In AM and follow up on GI team recommendations     Acute encephalopathy - secondary to the above imposed on chronic progressive failure to thrive and parkinsonism - still unresponsive and non verbal this AM, not following any commands  - continue supportive care with blood transfusion and plan on endoscopy per GI team today     Dementia with chronic functional quadriplegia in pt with progressive supranuclear palsy with parkinsonism  - pt bed bound, DNR status confirmed - allow to eat once pt seen by GI team, as pt able to tolerate  - please note that PCT has seen pt on last admission     Pressure ulcer - wound care team consulted     LUE DVT - holding Xarelto - holding aspirin    Hypomagnesemia - supplemented and WNL    Hypokalemia - mild,  supplement and repeat BMP in AM    Severe PCM - appreciate nutritionist input - NPO for now due to planned EGD today   DVT prophylaxis - SCD's  Code Status: DNR Family Communication:  No family at bedside  Disposition Plan: Home after EGD and if Hg remains stable, possible 04/25/2015  IV access:  Peripheral IV  Procedures and diagnostic studies:    Ct Abdomen Pelvis Wo Contrast 04/22/2015   No evidence of retroperitoneal bleed. 2. Urinary bladder is distended with irregular wall, Foley catheter in place. This may be due to chronic bladder outlet obstruction, prostate gland enlarged. 3. Evaluation of the remaining intra-abdominal viscera is limited given lack contrast and paucity of intra-abdominal fat. Equivocal gastric wall thickening. There is dense atherosclerosis of the aorta and its branches without aneurysm. Cardiomegaly. 4. Whole body wall edema, suspect third-spacing.    Dg Chest 1 View 04/19/2015   No acute cardiopulmonary abnormality seen.     Dg Chest 2 View 04/22/2015 Very limited examination.  No obvious/gross acute pulmonary process   Medical Consultants:  GI  Other Consultants:  None  IAnti-Infectives:   None  Faye Ramsay, MD  [facility] Pager [phone]  If 7PM-7AM, please contact night-coverage [email or web address] Password [facility] 04/24/2015, 10:47 AM   LOS: 2 days   HPI/Subjective: No events overnight.   Objective: Filed Vitals:   04/23/15 2356 04/24/15 0201 04/24/15 0519 04/24/15 1029  BP: 154/69 148/73 140/73 142/73  Pulse:  55 51 56 55  Temp: 98.6 F (37 C) 98.4 F (36.9 C) 98.4 F (36.9 C) 98.6 F (37 C)  TempSrc: Oral Oral Oral Oral  Resp: 16 16 14 16   Weight:   59.8 kg (131 lb 13.4 oz)   SpO2: 97% 97% 97% 98%    Intake/Output Summary (Last 24 hours) at 04/24/15 1047 Last data filed at 04/24/15 1029  Gross per 24 hour  Intake 1509.33 ml  Output   2350 ml  Net -840.67 ml    Exam:   General:  Pt is lethargic, non responsive, sitting upright,  severely contracted   Cardiovascular: Regular rate and rhythm, no rubs, no gallops  Respiratory: Clear to auscultation bilaterally, no wheezing, diminished breath sounds at bases   Abdomen: Soft, non tender, non distended, bowel sounds present, no guarding  Extremities: upper and lower extremity edema bilaterally, pulses DP and PT palpable bilaterally   Data Reviewed: Basic Metabolic Panel:  Recent Labs Lab 04/22/15 1609 04/23/15 0534 04/24/15 0533  NA 134* 136 139  K 4.2 3.6 3.4*  CL 97* 102 104  CO2 29 25 23   GLUCOSE 96 68 64*  BUN 16 9 7   CREATININE 0.72 0.65 0.68  CALCIUM 8.9 8.4* 8.5*  MG  --  1.6* 1.9  PHOS  --  3.3  --    Liver Function Tests:  Recent Labs Lab 04/23/15 0015 04/23/15 0534  AST 35 35  ALT 20 18  ALKPHOS 50 49  BILITOT 1.1 1.7*  PROT 6.0* 6.1*  ALBUMIN 2.9* 2.7*   CBC:  Recent Labs Lab 04/22/15 1609 04/22/15 2020 04/23/15 0534 04/24/15 0533  WBC 5.9  --  4.7 4.6  NEUTROABS 3.4  --   --   --   HGB 6.5* 5.9* 6.7* 10.4*  HCT 20.1* 18.3* 19.7* 31.5*  MCV 91.0  --  86.8 84.5  PLT 229  --  209 205   Scheduled Meds: . sodium chloride   Intravenous Once  . antiseptic oral rinse  7 mL Mouth Rinse BID  . levothyroxine  25 mcg Oral QAC breakfast  . memantine  10 mg Oral BID  . pantoprazole (PROTONIX) IV  40 mg Intravenous Q12H  . sodium chloride  3 mL Intravenous Q12H   Continuous Infusions:

## 2015-04-24 NOTE — Progress Notes (Signed)
Utilization review completed.  L J Wojciech Willetts RN, BSN, CM  [phone] 

## 2015-04-24 NOTE — Telephone Encounter (Signed)
New message      Want nurse/ Dr Harrington Challenger to know pt is in hosp.  Loss a lot of blood between 5-18 and now.  Pt was on xarelto because of multiply blood clots but now he is off of it.   Not sure why he is bleeding. Hgb is 6.5.  So far all test did not show where bleed is coming from.

## 2015-04-24 NOTE — Interval H&P Note (Signed)
History and Physical Interval Note:  04/24/2015 2:44 PM  Ricky [name]  has presented today for surgery, with the diagnosis of Melena and anemia  The various methods of treatment have been discussed with the patient and family. After consideration of risks, benefits and other options for treatment, the patient has consented to  Procedure(s): ESOPHAGOGASTRODUODENOSCOPY (EGD) (N/A) as a surgical intervention .  The patient's history has been reviewed, patient examined, no change in status, stable for surgery.  I have reviewed the patient's chart and labs.  Questions were answered to the patient's satisfaction.     [name]

## 2015-04-24 NOTE — Op Note (Signed)
[facility] [place], [id number]   ENDOSCOPY PROCEDURE REPORT  PATIENT: Ricky [name], Ricky [name]  MR#: [id number] BIRTHDATE: 26-Jul-1917 , 89  yrs. old GENDER: male ENDOSCOPIST:Brinson Tozzi Benson Norway, MD REFERRED BY: PROCEDURE DATE:  May 02, 2015 PROCEDURE:   EGD ASA CLASS:    Class III INDICATIONS: Anemia MEDICATION: Versed 1 mg IV and 12.5 TOPICAL ANESTHETIC:   none  DESCRIPTION OF PROCEDURE:   After the risks and benefits of the procedure were explained, informed consent was obtained.  The Pentax Gastroscope [id number]  endoscope was introduced through the mouth  and advanced to the second portion of the duodenum .  The instrument was slowly withdrawn as the mucosa was fully examined. Estimated blood loss is zero unless otherwise noted in this procedure report.    FINDINGS: The esophagus and gastroesophageal junction were completely normal in appearance.  The stomach was entered and closely examined.The antrum, angularis, and lesser curvature were well visualized, including a retroflexed view of the cardia and fundus.  The stomach wall was normally distensable.  The scope passed easily through the pylorus into the duodenum.    Retroflexed views revealed no abnormalities.    The scope was then withdrawn from the patient and the procedure completed.  COMPLICATIONS: There were no immediate complications.  ENDOSCOPIC IMPRESSION: 1) Normal EGD.  RECOMMENDATIONS: 1) No further GI intervention. 2) Follow HGB and transfuse as necessary.   _______________________________ eSignedCarol Ada, MD May 02, 2015 3:07 PM     cc:  CPT CODES: ICD CODES:  The ICD and CPT codes recommended by this software are interpretations from the data that the clinical staff has captured with the software.  The verification of the translation of this report to the ICD and CPT codes and modifiers is the sole responsibility of the health care institution and  practicing physician where this report was generated.  [employer] will not be held responsible for the validity of the ICD and CPT codes included on this report.  AMA assumes no liability for data contained or not contained herein. CPT is a Designer, television/film set of the [employer].

## 2015-04-25 DIAGNOSIS — D509 Iron deficiency anemia, unspecified: Secondary | ICD-10-CM

## 2015-04-25 DIAGNOSIS — K922 Gastrointestinal hemorrhage, unspecified: Secondary | ICD-10-CM

## 2015-04-25 DIAGNOSIS — G9340 Encephalopathy, unspecified: Secondary | ICD-10-CM

## 2015-04-25 DIAGNOSIS — F0390 Unspecified dementia without behavioral disturbance: Secondary | ICD-10-CM

## 2015-04-25 LAB — CBC
HEMATOCRIT: 32.4 % — AB (ref 39.0–52.0)
HEMOGLOBIN: 10.6 g/dL — AB (ref 13.0–17.0)
MCH: 28.1 pg (ref 26.0–34.0)
MCHC: 32.7 g/dL (ref 30.0–36.0)
MCV: 85.9 fL (ref 78.0–100.0)
Platelets: 257 10*3/uL (ref 150–400)
RBC: 3.77 MIL/uL — AB (ref 4.22–5.81)
RDW: 17.1 % — ABNORMAL HIGH (ref 11.5–15.5)
WBC: 5.4 10*3/uL (ref 4.0–10.5)

## 2015-04-25 LAB — BASIC METABOLIC PANEL
ANION GAP: 11 (ref 5–15)
BUN: 9 mg/dL (ref 6–20)
CO2: 23 mmol/L (ref 22–32)
Calcium: 8.4 mg/dL — ABNORMAL LOW (ref 8.9–10.3)
Chloride: 101 mmol/L (ref 101–111)
Creatinine, Ser: 0.76 mg/dL (ref 0.61–1.24)
GFR calc Af Amer: >60 mL/min (ref >60)
GFR calc non Af Amer: >60 mL/min (ref >60)
Glucose, Bld: 54 mg/dL — ABNORMAL LOW (ref 65–99)
Potassium: 3.8 mmol/L (ref 3.5–5.1)
SODIUM: 135 mmol/L (ref 135–145)

## 2015-04-25 MED ORDER — PANTOPRAZOLE SODIUM 40 MG PO TBEC: 40.0000 mg | DELAYED_RELEASE_TABLET | Freq: Every day | ORAL | Status: DC

## 2015-04-25 MED ORDER — FERROUS SULFATE 325 (65 FE) MG PO TABS
325.0000 mg | ORAL_TABLET | Freq: Two times a day (BID) | ORAL | Status: DC
  Administered 2015-04-25: 325 mg via ORAL
  Filled 2015-04-25 (×2): qty 1

## 2015-04-25 MED ORDER — ASPIRIN EC 81 MG PO TBEC: 81.0000 mg | DELAYED_RELEASE_TABLET | Freq: Every day | ORAL | Status: DC

## 2015-04-25 MED ORDER — ASPIRIN EC 325 MG PO TBEC: 81.0000 mg | DELAYED_RELEASE_TABLET | Freq: Every evening | ORAL | Status: DC

## 2015-04-25 NOTE — Consult Note (Signed)
WOC wound consult note Reason for Consult: pressure ulcers Wound type: Blanchable skin over the bilateral heels, did not appreciate anything open today. Sacrum is scarred but overall healed, no open ulcers Dressing procedure/placement/frequency: foam dressing to the sacrum and bilateral heels to protect from further injury and provide some sheer/friction relief.  Turn and reposition frequently  Para March RN,CWOCN [phone]

## 2015-04-25 NOTE — Telephone Encounter (Signed)
Will forward to Dr. Alan Ripper nurse, Caren Hazy as well.

## 2015-04-25 NOTE — Progress Notes (Signed)
Nutrition Follow-up  DOCUMENTATION CODES:  Severe malnutrition in context of chronic illness, Underweight  INTERVENTION: Pt to be discharged today.  Encourage nutritrional supplement intake post discharge to aid in wound healing.  NUTRITION DIAGNOSIS:  Malnutrition related to chronic illness as evidenced by severe depletion of body fat, severe depletion of muscle mass, percent weight loss; ongoing  GOAL:  Patient will meet greater than or equal to 90% of their needs; not met  MONITOR:  PO intake, Supplement acceptance, Weight trends, Labs, I & O's  REASON FOR ASSESSMENT:  Low Braden    ASSESSMENT: Pt with PMH of IBS, diverticulosis, alzheimers, HTN, dementia, parkinsonian syndrome. Presents with dehydration, anemia, and an episode of large tarry stool. Pt diagnosed with LUE DVT.   Pt is currently on a dysphagia 3 diet with nectar thick liquids. Meal completion has been 25%. Pt was mostly non-verbal during time of visit. No family at bedside. Plans for pt to be discharged today. Encourage nutritional supplements at home to aid in caloric and protein needs as well as in wound healing.  Labs and medications reviewed.  Height:  Ht Readings from Last 1 Encounters:  04/18/15 6' 2" (1.88 m)    Weight:  Wt Readings from Last 1 Encounters:  04/24/15 125 lb 6.4 oz (56.881 kg)    Ideal Body Weight:  86.4 kg  Wt Readings from Last 10 Encounters:  04/24/15 125 lb 6.4 oz (56.881 kg)  04/03/15 124 lb 12.5 oz (56.6 kg)  03/25/15 115 lb (52.164 kg)  01/11/15 115 lb (52.164 kg)  11/22/14 115 lb (52.164 kg)  06/28/14 160 lb 3.3 oz (72.67 kg)  04/20/14 150 lb (68.04 kg)  03/31/14 141 lb 8 oz (64.184 kg)  03/29/14 158 lb (71.668 kg)  03/28/14 153 lb 11.2 oz (69.718 kg)    BMI:  Body mass index is 16.09 kg/(m^2). Underweight  Estimated Nutritional Needs:  Kcal:  1700-1900  Protein:  80-90 grams  Fluid:  1.7 - 1.9 L/day  Skin:   Stage II pressure ulcer on L heel,  back, and stage I on sacrum, non-pitting LE, LUE, edema  Diet Order:  DIET DYS 3 Room service appropriate?: Yes; Fluid consistency:: Nectar Thick Diet - low sodium heart healthy  EDUCATION NEEDS:  Education needs no appropriate at this time   Intake/Output Summary (Last 24 hours) at 04/25/15 1348 Last data filed at 04/25/15 0900  Gross per 24 hour  Intake    510 ml  Output   1350 ml  Net   -840 ml    Last BM:  PTA   La, MS, RD, LDN Pager # [phone] After hours/ weekend pager # [phone]  

## 2015-04-25 NOTE — Discharge Summary (Signed)
Physician Discharge Summary  Ricky [name]:[id number] DOB: 06-07-1917 DOA: 04/22/2015  PCP: Hollace Kinnier, DO  Admit date: 04/22/2015 Discharge date: 04/25/2015  Time spent: 25 minutes  Recommendations for Outpatient Follow-up:  1. Follow up with PCP in 2 weeks check a CBC  Discharge Diagnoses:  Active Problems:   GI bleed   Hyperlipidemia   Acute encephalopathy   Dehydration   Dementia   Iron deficiency anemia   Discharge Condition: stable  Diet recommendation: regular  Filed Weights   04/23/15 2123 04/24/15 0519 04/24/15 2024  Weight: 59.875 kg (132 lb) 59.8 kg (131 lb 13.4 oz) 56.881 kg (125 lb 6.4 oz)    History of present illness:  81 y.o. male   has a past medical history of IBS (irritable bowel syndrome); Diverticulosis; Cataract; OAB (overactive bladder); Macular degeneration; Alzheimer disease; Hypertension; Arthritis; Osteoporosis; Parkinsonian syndrome; Syncope, vasovagal; Neuromuscular disorder; Dementia due to Parkinson's disease without behavioral disturbance; Unspecified transient cerebral ischemia; Unspecified urinary incontinence; Senile dementia, uncomplicated; Unspecified constipation; Hypothyroidism; Anemia, unspecified; Depression; Alzheimer's disease; Gout, unspecified; Thoracic or lumbosacral neuritis or radiculitis, unspecified; Progressive supranuclear palsy; Unspecified transient cerebral ischemia; Unspecified urinary incontinence; Syncope and collapse; Paralysis agitans; Unspecified transient cerebral ischemia; Pneumonitis due to inhalation of food or vomitus; Cellulitis and abscess of oral soft tissues; Screening for lipoid disorders; Senile dementia, uncomplicated; Unspecified constipation; Disorder of bone and cartilage, unspecified; Other specified acquired hypothyroidism; Unspecified hypothyroidism; Anemia, unspecified; Depressive disorder, not elsewhere classified; Alzheimer's disease; Paralysis agitans; Pneumonia, organism unspecified; Pseudomonas  infection in conditions classified elsewhere and of unspecified site; Unspecified essential hypertension; Urinary tract infection, site not specified; Osteoarthrosis, unspecified whether generalized or localized, unspecified site; Osteoporosis, unspecified; Gout, unspecified; Thoracic or lumbosacral neuritis or radiculitis, unspecified; and Stroke.   Hospital Course:  Acute on chronic blood loss anemia of unclear etiology/anemia of chronic disease:  Probably exacerbated by Xarelto nonaspirin which were held on admission. GI was consulted recommended an EGD which was done and showed no clear source of bleeding. He was transfused 2 packed red blood cells and his hemoglobin came up from 6.7-10. It was discussed with GI and they recommended some Xarelto on aspirin.  Acute encephalopathy: Probably secondary to progressive decline and Parkinson disease, back to baseline.  Dementia with chronic functional quadriplegia in a patient with progressive supranuclear palsy and Parkinson's: Patient is bedbound DO NOT RESUSCITATE was confirmed. We'll follow with palliative as an outpatient.  Pressure ulcer: Blanchable skin over heels bilaterally with no open wounds. Wound care recommended foam dressing of the sacrum and bilateral heels.  Lower extremity DVT: Several toes held on admission now resumed.  Hypomagnesemia: - supplemented and WNL  Hypokalemia - Resolved  Procedures: Ct Abdomen Pelvis Wo Contrast 04/22/2015 No evidence of retroperitoneal bleed. 2. Urinary bladder is distended with irregular wall, Foley catheter in place. This may be due to chronic bladder outlet obstruction, prostate gland enlarged. 3. Evaluation of the remaining intra-abdominal viscera is limited given lack contrast and paucity of intra-abdominal fat. Equivocal gastric wall thickening. There is dense atherosclerosis of the aorta and its branches without aneurysm. Cardiomegaly. 4. Whole body wall edema, suspect  third-spacing.   Dg Chest 1 View 04/19/2015 No acute cardiopulmonary abnormality seen.   Dg Chest 2 View 04/22/2015 Very limited examination. No obvious/gross acute pulmonary process.  Consultations:  GI  Discharge Exam: Filed Vitals:   04/25/15 0930  BP: 151/85  Pulse: 81  Temp: 99.1 F (37.3 C)  Resp: 50    General: A & O x1 Cardiovascular:  RRR Respiratory: good air movement CTA B/L  Discharge Instructions   Discharge Instructions    Diet - low sodium heart healthy    Complete by:  As directed      Increase activity slowly    Complete by:  As directed           Current Discharge Medication List    START taking these medications   Details  pantoprazole (PROTONIX) 40 MG tablet Take 1 tablet (40 mg total) by mouth daily. Qty: 30 tablet, Refills: 0      CONTINUE these medications which have CHANGED   Details  aspirin EC 81 MG tablet Take 1 tablet (81 mg total) by mouth daily. Qty: 30 tablet, Refills: 0      CONTINUE these medications which have NOT CHANGED   Details  acetaminophen (TYLENOL) 500 MG tablet Take 1,000 mg by mouth 3 (three) times daily.     bacitracin ointment Apply 1 application topically 2 (two) times daily. Applies to head of penis to decrease infection from catheter.    Calcium Carbonate-Vit D-Min (CALTRATE 600+D PLUS) 600-800 MG-UNIT CHEW Chew 1 tablet by mouth 2 (two) times daily with a meal.     CRANBERRY SOFT PO Take 2 capsules by mouth 3 (three) times daily.     Cyanocobalamin (VITAMIN B-12) 5000 MCG SUBL Place 5,000 mcg under the tongue 2 (two) times daily.    ferrous sulfate 325 (65 FE) MG tablet Take 325 mg by mouth every other day.     guaiFENesin (MUCINEX) 600 MG 12 hr tablet Take 600 mg by mouth 2 (two) times daily as needed for cough.    hydrALAZINE (APRESOLINE) 25 MG tablet Take 1/2 tablet by mouth twice daily to control blood pressure Qty: 30 tablet, Refills: 6    lactose free nutrition (BOOST) LIQD Take 90 mLs by  mouth 3 (three) times daily between meals. Refills: 0    levothyroxine (SYNTHROID, LEVOTHROID) 25 MCG tablet TAKE 1 TABLET BY MOUTH DAILY Qty: 90 tablet, Refills: 1    lidocaine (LIDODERM) 5 % PLACE 3 PATCHES ONTO THE SKIN DAILY. REMOVE & DISCARD PATCH WITHIN 12 HOURS OR AS DIRECTED BY MD Qty: 90 patch, Refills: 1    Melatonin 5 MG TABS Take 5 mg by mouth at bedtime as needed (For sleep.).    memantine (NAMENDA) 10 MG tablet Take 10 mg by mouth 2 (two) times daily.    Multiple Vitamins-Minerals (ICAPS AREDS FORMULA PO) Take 2 capsules by mouth 2 (two) times daily with a meal.     NON FORMULARY D-Mannose, Take 2 teaspoon by mouth with fluids 2 times a day    polyethylene glycol (MIRALAX / GLYCOLAX) packet Take 17 g by mouth daily as needed for mild constipation.    Probiotic Product ([place]) CAPS Take 1 capsule by mouth 2 (two) times daily.    psyllium (METAMUCIL) 58.6 % packet 2 tablespoons two times daily    Rivaroxaban (XARELTO) 15 MG TABS tablet Take 1 tablet (15 mg total) by mouth 2 (two) times daily with a meal. Qty: 42 tablet, Refills: 0   Associated Diagnoses: Left arm swelling; Arm DVT (deep venous thromboembolism), acute, left    VOLTAREN 1 % GEL Apply 2 g topically 4 (four) times daily.     AMBULATORY NON FORMULARY MEDICATION Pressure Air Mattress Dx: G60.8, L89.150 Qty: 1 each, Refills: 0       Allergies  Allergen Reactions  . Other Other (See Comments)    Dairy  products- cause swallowing difficulty    Follow-up Information    Follow up with REED, TIFFANY, DO In 2 weeks.   Specialty:  Geriatric Medicine   Why:  follow up with PCP in 2 weeks check a CBC   Contact information:   [address] [place] [address] [phone]        The results of significant diagnostics from this hospitalization (including imaging, microbiology, ancillary and laboratory) are listed below for reference.    Significant Diagnostic Studies: Ct Abdomen  Pelvis Wo Contrast  04/22/2015   CLINICAL DATA:  81 year old male with anemia and UTI. Decreased urine output. Evaluate for retroperitoneal bleed.  EXAM: CT ABDOMEN AND PELVIS WITHOUT CONTRAST  TECHNIQUE: Multidetector CT imaging of the abdomen and pelvis was performed following the standard protocol without IV contrast.  COMPARISON:  No prior CT.  FINDINGS: The heart is enlarged. Volume loss in the lung bases with likely atelectasis in the left greater than right lower lobe.  No stranding in the retroperitoneum to suggest retroperitoneal bleed.  Evaluation of the solid and hollow viscera is limited given lack of contrast, paucity of intra-abdominal fat, and arms down positioning.  Homogeneous attenuation to the liver, no focal lesion allowing for noncontrast exam. Gallbladder is decompressed. The unenhanced spleen is normal in size. The pancreas is not well-defined, no definite peripancreatic inflammatory change. The adrenal glands are not well defined. There is no hydronephrosis. No renal stones. Limited bowel assessment, no small bowel dilatation. There is equivocal gastric wall thickening. Moderate stool throughout the colon. Suspect air-filled appendix in the right lower quadrant.  Diffuse atherosclerosis of the abdominal aorta without aneurysm. Decreased density of blood pool consistent with anemia.  Fluid-filled bowel loops in the pelvis. Urinary bladder is distended with irregular wall, Foley catheter is in place. Prostate gland appears enlarged.  No definite free air free fluid in the abdomen or pelvis. Mild whole body wall edema.  There is diffuse bony under mineralization. Sequela of right superior inferior pubic rami fractures. Compression deformity of L1.  IMPRESSION: 1. No evidence of retroperitoneal bleed. 2. Urinary bladder is distended with irregular wall, Foley catheter in place. This may be due to chronic bladder outlet obstruction, prostate gland enlarged. 3. Evaluation of the remaining  intra-abdominal viscera is limited given lack contrast and paucity of intra-abdominal fat. Equivocal gastric wall thickening. There is dense atherosclerosis of the aorta and its branches without aneurysm. Cardiomegaly. 4. Whole body wall edema, suspect third-spacing.   Electronically Signed   By: Jeb Levering M.D.   On: 04/22/2015 22:20   Dg Chest 1 View  04/19/2015   CLINICAL DATA:  Shortness of breath, edema.  EXAM: CHEST  1 VIEW  COMPARISON:  Mar 31, 2015.  FINDINGS: Stable cardiomediastinal silhouette. No pneumothorax or significant pleural effusion is noted. Moderate dextroscoliosis of lower thoracic spine is noted which is unchanged compared to prior exam. No acute pulmonary disease is noted. Bony thorax is intact.  IMPRESSION: No acute cardiopulmonary abnormality seen.   Electronically Signed   By: Marijo Conception, M.D.   On: 04/19/2015 08:31   Dg Chest 2 View  04/22/2015   CLINICAL DATA:  Fever.  EXAM: CHEST  2 VIEW  COMPARISON:  04/18/2015  FINDINGS: Dissemination is quite limited due to the position of the patient has arms which are obscuring most of the chest. The heart is enlarged. There is tortuosity and calcification of the thoracic aorta. The lungs are grossly clear.  IMPRESSION: Very limited examination.  No obvious/gross acute pulmonary process   Electronically Signed   By: Marijo Sanes M.D.   On: 04/22/2015 16:58   Ct Head Wo Contrast  03/31/2015   CLINICAL DATA:  Mental status change.  Not as responsive.  EXAM: CT HEAD WITHOUT CONTRAST  TECHNIQUE: Contiguous axial images were obtained from the base of the skull through the vertex without intravenous contrast.  COMPARISON:  Head CT 04/12/2014  FINDINGS: Patient is extremely kyphotic therefore non standard orientation was scanned. The generates bone artifact.  No acute intracranial hemorrhage. No focal mass lesion. No CT evidence of acute infarction. No midline shift or mass effect. No hydrocephalus. Basilar cisterns are patent.   Generalized cortical atrophy and ventricular dilatation.  Paranasal sinuses and mastoid air cells are clear. Chronic opacification of the sphenoid sinus.  IMPRESSION: 1. No acute intracranial findings. Nonstandard positioning does limit evaluation. 2. Chronic atrophy and microvascular disease. 3. Chronic opacification of the sphenoid sinuses air cells   Electronically Signed   By: Suzy Bouchard M.D.   On: 03/31/2015 20:35   Dg Chest Portable 1 View  03/31/2015   CLINICAL DATA:  Recent discharge from hospital for shingles an urinary tract infection. Altered mental status.  EXAM: PORTABLE CHEST - 1 VIEW  COMPARISON:  03/25/2015 and 01/10/2015  FINDINGS: Patient is rotated to the left with chin/head overlying the left apex. Lungs are adequately inflated without consolidation or effusion. Small nodular opacity over the right midlung and left midlung unchanged. Mild stable cardiomegaly. Mild calcified plaque over the aortic arch. Remainder of the exam is unchanged.  IMPRESSION: No acute cardiopulmonary disease.  Stable small bilateral nodules unchanged as agree with previous recommendation of chest CT.  Mild cardiomegaly.   Electronically Signed   By: Marin Olp M.D.   On: 03/31/2015 19:30    Microbiology: Recent Results (from the past 240 hour(s))  Urine culture     Status: None   Collection Time: 04/22/15  1:23 PM  Result Value Ref Range Status   Specimen Description URINE, CATHETERIZED  Final   Special Requests NONE  Final   Colony Count NO GROWTH Performed at [facility]   Final   Culture NO GROWTH Performed at [facility]   Final   Report Status 04/24/2015 FINAL  Final     Labs: Basic Metabolic Panel:  Recent Labs Lab 04/22/15 1609 04/23/15 0534 04/24/15 0533 04/25/15 0459  NA 134* 136 139 135  K 4.2 3.6 3.4* 3.8  CL 97* 102 104 101  CO2 29 25 23 23   GLUCOSE 96 68 64* 54*  BUN 16 9 7 9   CREATININE 0.72 0.65 0.68 0.76  CALCIUM 8.9 8.4* 8.5* 8.4*  MG  --   1.6* 1.9  --   PHOS  --  3.3  --   --    Liver Function Tests:  Recent Labs Lab 04/23/15 0015 04/23/15 0534  AST 35 35  ALT 20 18  ALKPHOS 50 49  BILITOT 1.1 1.7*  PROT 6.0* 6.1*  ALBUMIN 2.9* 2.7*   No results for input(s): LIPASE, AMYLASE in the last 168 hours. No results for input(s): AMMONIA in the last 168 hours. CBC:  Recent Labs Lab 04/22/15 1609 04/22/15 2020 04/23/15 0534 04/24/15 0533 04/25/15 0459  WBC 5.9  --  4.7 4.6 5.4  NEUTROABS 3.4  --   --   --   --   HGB 6.5* 5.9* 6.7* 10.4* 10.6*  HCT 20.1* 18.3* 19.7* 31.5* 32.4*  MCV 91.0  --  86.8 84.5 85.9  PLT 229  --  209 205 257   Cardiac Enzymes: No results for input(s): CKTOTAL, CKMB, CKMBINDEX, TROPONINI in the last 168 hours. BNP: BNP (last 3 results) No results for input(s): BNP in the last 8760 hours.  ProBNP (last 3 results) No results for input(s): PROBNP in the last 8760 hours.  CBG: No results for input(s): GLUCAP in the last 168 hours.     Signed:  Charlynne Cousins  [facility] 04/25/2015, 1:55 PM

## 2015-04-26 ENCOUNTER — Encounter (HOSPITAL_COMMUNITY): Payer: Self-pay | Admitting: Gastroenterology

## 2015-04-26 ENCOUNTER — Ambulatory Visit: Payer: 59 | Admitting: Internal Medicine

## 2015-04-26 ENCOUNTER — Telehealth: Payer: Self-pay | Admitting: Internal Medicine

## 2015-04-26 LAB — TYPE AND SCREEN
ABO/RH(D): AB POS
Antibody Screen: NEGATIVE
UNIT DIVISION: 0
Unit division: 0
Unit division: 0
Unit division: 0

## 2015-04-26 NOTE — Telephone Encounter (Signed)
Pt's daughter requests home health through [facility] no [place] as was placed by the hospital.  New referral entered.

## 2015-04-27 ENCOUNTER — Encounter: Payer: Self-pay | Admitting: Internal Medicine

## 2015-04-27 ENCOUNTER — Ambulatory Visit (INDEPENDENT_AMBULATORY_CARE_PROVIDER_SITE_OTHER): Payer: Medicare Other | Admitting: Internal Medicine

## 2015-04-27 VITALS — BP 130/80 | HR 73 | Temp 97.4°F | Resp 18

## 2015-04-27 DIAGNOSIS — I82622 Acute embolism and thrombosis of deep veins of left upper extremity: Secondary | ICD-10-CM | POA: Diagnosis not present

## 2015-04-27 DIAGNOSIS — R404 Transient alteration of awareness: Secondary | ICD-10-CM | POA: Diagnosis not present

## 2015-04-27 DIAGNOSIS — Z466 Encounter for fitting and adjustment of urinary device: Secondary | ICD-10-CM | POA: Diagnosis not present

## 2015-04-27 DIAGNOSIS — E43 Unspecified severe protein-calorie malnutrition: Secondary | ICD-10-CM | POA: Diagnosis not present

## 2015-04-27 DIAGNOSIS — G231 Progressive supranuclear ophthalmoplegia [Steele-Richardson-Olszewski]: Secondary | ICD-10-CM | POA: Diagnosis not present

## 2015-04-27 DIAGNOSIS — R627 Adult failure to thrive: Secondary | ICD-10-CM | POA: Diagnosis not present

## 2015-04-27 DIAGNOSIS — D62 Acute posthemorrhagic anemia: Secondary | ICD-10-CM | POA: Diagnosis not present

## 2015-04-27 DIAGNOSIS — R339 Retention of urine, unspecified: Secondary | ICD-10-CM | POA: Diagnosis not present

## 2015-04-27 DIAGNOSIS — L89154 Pressure ulcer of sacral region, stage 4: Secondary | ICD-10-CM | POA: Diagnosis not present

## 2015-04-27 NOTE — Progress Notes (Signed)
Patient ID: Ricky [name], male   DOB: [birth year], 81 y.o.   MRN: [id number]   Location:  [facility]  Code Status: DNR Goals of Care: Advanced Directive information Does patient have an advance directive?: Yes, Type of Advance Directive: Out of facility DNR (pink MOST or yellow form), Pre-existing out of facility DNR order (yellow form or pink MOST form): Pink MOST form placed in chart (order not valid for inpatient use), Does patient want to make changes to advanced directive?: No - Patient declined   Chief Complaint  Patient presents with  . Hospitalization Follow-up    HPI: Patient is a 81 y.o. black male seen in the office today for hospitalization f/u.    He was hospitalized with acute anemia after we started him on xarelto for left upper extremity DVT.  Turned out final read showed only superficial phlebitis after all Was in urgent care Sunday with fever.  Then ended up at ED where they discovered his hgb had dropped from over 10 to 6 over a couple of weeks.  The xarelto was stopped and he underwent numerous investigations and was transfused 2 units prbcs.  He had an EGD performed by Dr. Benson Norway which was completely normal.  He returns here for a f/u CBC.  Hospital notes also indicate a palliative care consult was placed and MOST form updated and scanned indicating DNR, limited interventions, no tube feeding, abx and fluids ok but to be determined at the time.    When came home, had increase in left arm swelling again and then some in right hand and arm with a distinct demarcation--his daughter showed me a pic. Spoke in ED, then not in a week, now talking again--will mumble 1-2 words like water today b/c he was thirsty.  Two large BMs yesterday 1500cc urine overnight Ate a lot in the hospital, but didn't eat much yesterday.  Did better with liquids than food so [name] thinks his throat was irritated post-EGD.    Transport chair order needed again  though this is the third order.    We discussed again the ideas of at palliative care and also hospice care for him.  [name] seems frustrated that people don't want him taking his dad to the hospital when something's wrong, but he could be "in a nursing home somewhere with bad quality of life".  I again applauded their good care of their dad at home.  We discussed that providers have concerns about pts with memory loss being brought to the ED recurrently due to difficulties with disorientation and the downsides of seeing a lot of new faces and getting various tubes poked into them.  Also that many older patients feel like is enough is enough when their qol is not so good.  He feels that his dad's PSP does not follow the normal dementia trajectory and his good and bad days still mean that hospice isn't right for him.  He also was told that [facility] does not have home hospice and that he has to pay out of pocket for palliative care services.  He was also told that his dad could not be treated for an infection or given fluids if he was on hospice care.  He also was very clear that what they need is some CNA help.  He was under the impression that hospice would come in and assess him and leave and not offer ongoing care services which is definitely not true.  I  advised I would clarify some of this information which seemed incorrect and get back with them.  Review of Systems:  Review of Systems  Unable to perform ROS: patient nonverbal  except 1-2 words here and there  Past Medical History  Diagnosis Date  . IBS (irritable bowel syndrome)   . Diverticulosis   . Cataract   . OAB (overactive bladder)   . Macular degeneration   . Hypertension   . Parkinsonian syndrome   . Syncope, vasovagal   . 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 Unspecified transient cerebral ischemia   . Unspecified urinary incontinence   . Hypothyroidism   . Anemia, unspecified   . Depression   .  Alzheimer's disease   . Gout, unspecified   . Thoracic or lumbosacral neuritis or radiculitis, unspecified   . Progressive supranuclear palsy   . Paralysis agitans   . Pneumonitis due to inhalation of food or vomitus   . Cellulitis and abscess of oral soft tissues   . Unspecified hypothyroidism   . Pneumonia, organism unspecified   . Pseudomonas infection in conditions classified elsewhere and of unspecified site   . Urinary tract infection, site not specified   . Osteoarthrosis, unspecified whether generalized or localized, unspecified site   . Osteoporosis, unspecified   . Gout, unspecified   . Thoracic or lumbosacral neuritis or radiculitis, unspecified   . Stroke     Past Surgical History  Procedure Laterality Date  . Esophagogastroduodenoscopy    . Eye surgery  2006    LEFT CATARACT  . Esophagogastroduodenoscopy N/A 04/24/2015    Procedure: ESOPHAGOGASTRODUODENOSCOPY (EGD);  Surgeon: Carol Ada, MD;  Location: [facility] ENDOSCOPY;  Service: Endoscopy;  Laterality: N/A;    Allergies  Allergen Reactions  . Other Other (See Comments)    Dairy products- cause swallowing difficulty    Medications: Patient's Medications  New Prescriptions   No medications on file  Previous Medications   ACETAMINOPHEN (TYLENOL) 500 MG TABLET    Take 1,000 mg by mouth 3 (three) times daily.    AMBULATORY NON FORMULARY MEDICATION    Pressure Air Mattress Dx: G60.8, L89.150   ASPIRIN EC 81 MG TABLET    Take 1 tablet (81 mg total) by mouth daily.   BACITRACIN OINTMENT    Apply 1 application topically 2 (two) times daily. Applies to head of penis to decrease infection from catheter.   CALCIUM CARBONATE-VIT D-MIN (CALTRATE 600+D PLUS) 600-800 MG-UNIT CHEW    Chew 1 tablet by mouth 2 (two) times daily with a meal.    CRANBERRY SOFT PO    Take 2 capsules by mouth 3 (three) times daily.    CYANOCOBALAMIN (VITAMIN B-12) 5000 MCG SUBL    Place 5,000 mcg under the tongue 2 (two) times daily.   FERROUS  SULFATE 325 (65 FE) MG TABLET    Take 325 mg by mouth every other day.    GUAIFENESIN (MUCINEX) 600 MG 12 HR TABLET    Take 600 mg by mouth 2 (two) times daily as needed for cough.   HYDRALAZINE (APRESOLINE) 25 MG TABLET    Take 1/2 tablet by mouth twice daily to control blood pressure   LACTOSE FREE NUTRITION (BOOST) LIQD    Take 90 mLs by mouth 3 (three) times daily between meals.   LEVOTHYROXINE (SYNTHROID, LEVOTHROID) 25 MCG TABLET    TAKE 1 TABLET BY MOUTH DAILY   LIDOCAINE (LIDODERM) 5 %    PLACE 3 PATCHES ONTO THE SKIN  DAILY. REMOVE & DISCARD PATCH WITHIN 12 HOURS OR AS DIRECTED BY MD   MELATONIN 5 MG TABS    Take 5 mg by mouth at bedtime as needed (For sleep.).   MEMANTINE (NAMENDA) 10 MG TABLET    Take 10 mg by mouth 2 (two) times daily.   MULTIPLE VITAMINS-MINERALS (ICAPS AREDS FORMULA PO)    Take 2 capsules by mouth 2 (two) times daily with a meal.    NON FORMULARY    D-Mannose, Take 2 teaspoon by mouth with fluids 2 times a day   PANTOPRAZOLE (PROTONIX) 40 MG TABLET    Take 1 tablet (40 mg total) by mouth daily.   POLYETHYLENE GLYCOL (MIRALAX / GLYCOLAX) PACKET    Take 17 g by mouth daily as needed for mild constipation.   PROBIOTIC PRODUCT (PHILLIPS COLON HEALTH) CAPS    Take 1 capsule by mouth 2 (two) times daily.   PSYLLIUM (METAMUCIL) 58.6 % PACKET    2 tablespoons two times daily   RIVAROXABAN (XARELTO) 15 MG TABS TABLET    Take 1 tablet (15 mg total) by mouth 2 (two) times daily with a meal.   VOLTAREN 1 % GEL    Apply 2 g topically 4 (four) times daily.   Modified Medications   No medications on file  Discontinued Medications   No medications on file    Physical Exam: Filed Vitals:   04/27/15 1101  BP: 130/80  Pulse: 73  Temp: 97.4 F (36.3 C)  TempSrc: Oral  Resp: 18  SpO2: 95%   Physical Exam  Constitutional:  Thin black male seated in wheelchair with some developing arm contractures  Cardiovascular: Normal rate, regular rhythm, normal heart sounds and intact  distal pulses.   Left arm remains overtly swollen and ecchymotic  Pulmonary/Chest: Effort normal and breath sounds normal.  Abdominal: Soft. Bowel sounds are normal. He exhibits no distension. There is no tenderness.  Genitourinary:  Catheter in place with leg bag with dark yellow urine  Musculoskeletal:  Requires at least one person assist for all ADLs  Neurological: He is alert.  Today; paying attention to conversation  Skin:  Right ear with some ecchymoses/DTI present    Labs reviewed: Basic Metabolic Panel:  Recent Labs  01/11/15 0530 01/11/15 0710  03/25/15 1720  04/23/15 0534 04/24/15 0533 04/25/15 0459  NA 129*  --   < >  --   < > 136 139 135  K 4.2  --   < >  --   < > 3.6 3.4* 3.8  CL 97  --   < >  --   < > 102 104 101  CO2 25  --   < >  --   < > 25 23 23   GLUCOSE 84  --   < >  --   < > 68 64* 54*  BUN 11  --   < >  --   < > 9 7 9   CREATININE 0.67  --   < >  --   < > 0.65 0.68 0.76  CALCIUM 8.4  --   < >  --   < > 8.4* 8.5* 8.4*  MG 1.9  --   --   --   --  1.6* 1.9  --   PHOS 3.2  --   --   --   --  3.3  --   --   TSH  --  0.842  --  1.228  --  1.352  --   --   < > =  values in this interval not displayed. Liver Function Tests:  Recent Labs  03/31/15 2020 04/23/15 0015 04/23/15 0534  AST 31 35 35  ALT 19 20 18   ALKPHOS 65 50 49  BILITOT 0.6 1.1 1.7*  PROT 7.5 6.0* 6.1*  ALBUMIN 3.5 2.9* 2.7*  CBC:  Recent Labs  03/25/15 1520  03/31/15 2020  04/22/15 1609  04/23/15 0534 04/24/15 0533 04/25/15 0459  WBC 7.7  < > 6.7  < > 5.9  --  4.7 4.6 5.4  NEUTROABS 6.2  --  5.4  --  3.4  --   --   --   --   HGB 13.0  < > 12.3*  < > 6.5*  < > 6.7* 10.4* 10.6*  HCT 40.9  < > 38.1*  < > 20.1*  < > 19.7* 31.5* 32.4*  MCV 89.5  < > 89.9  < > 91.0  --  86.8 84.5 85.9  PLT 213  < > 212  < > 229  --  209 205 257  < > = values in this interval not displayed.  Lab Results  Component Value Date   HGBA1C 5.3 06/21/2012    Procedures since last visit: 04/22/15:   CXR:  Very limited.  No obvious/gross acute pulmonary process.    04/22/15:  CT abd/pelvis w/o contrast:  1. No evidence of retroperitoneal bleed. 2. Urinary bladder is distended with irregular wall, Foley catheter in place. This may be due to chronic bladder outlet obstruction, prostate gland enlarged. 3. Evaluation of the remaining intra-abdominal viscera is limited given lack contrast and paucity of intra-abdominal fat. Equivocal gastric wall thickening. There is dense atherosclerosis of the aorta and its branches without aneurysm. Cardiomegaly. 4. Whole body wall edema, suspect third-spacing.  Assessment/Plan 1. Acute blood loss anemia - etiology unclear but certainly worsened with initiation of xarelto -this was discontinued and he was started on baby asa - he had 2 units prbcs and hgb went from 6 to 10 again - needs f/u today:  CBC with Differential/Platelet - Ambulatory referral to [place]  2. Arm DVT (deep venous thromboembolism), acute, left - appears final report actually showed superficial phlebitis of several veins -now on baby asa only - Ambulatory referral to [facility]  3. Progressive supranuclear ophthalmoplegia or palsy - is progressing gradually, he is less verbal than he was a couple of years ago -his family still doesn't think he's ready for hospice care due to his waxing and waning cognitive status and they are certain he has good quality of life despite his dependence in adls, vacillating po intake, protein malnutrition (has some degree of 3rd spacing even seen on his ct  Abd/pelvis), minimal verbal communication and essentially bedbound status (they put him in a wheelchair, but he slides down and cannot really support himself) - Ambulatory referral to Home Health--family also needs home CNA help  4. FTT (failure to thrive) in adult - as in #3 - of note, this man does not have alzheimer's or parkinson's he has PSP - Ambulatory referral to [place]  5.  Urinary retention with incomplete bladder emptying - cont foley with leg bag changed monthly - Ambulatory referral to [name]  6. Protein-calorie malnutrition, severe -cont to encourage po intake, swallowing going better lately per son; severe kyphosis not helpful here - Ambulatory referral to [place]  Labs/tests ordered:  Cbc with diff Next appt:  3 mos.  Haile Bosler L. Kashira Behunin, D.O. [facility] [address], [place] [address] Cell Phone (Mon-Fri 8am-5pm):  [phone] On Call:  [phone] & follow prompts after 5pm & weekends Office Phone:  [phone] Office Fax:  [phone]

## 2015-04-28 LAB — CBC WITH DIFFERENTIAL/PLATELET
Basophils Absolute: 0 10*3/uL (ref 0.0–0.2)
Basos: 0 %
EOS (ABSOLUTE): 0 10*3/uL (ref 0.0–0.4)
Eos: 0 %
Hematocrit: 36.6 % — ABNORMAL LOW (ref 37.5–51.0)
Hemoglobin: 12 g/dL — ABNORMAL LOW (ref 12.6–17.7)
Immature Grans (Abs): 0 10*3/uL (ref 0.0–0.1)
Immature Granulocytes: 0 %
Lymphocytes Absolute: 0.9 10*3/uL (ref 0.7–3.1)
Lymphs: 14 %
MCH: 28.5 pg (ref 26.6–33.0)
MCHC: 32.8 g/dL (ref 31.5–35.7)
MCV: 87 fL (ref 79–97)
Monocytes Absolute: 0.5 10*3/uL (ref 0.1–0.9)
Monocytes: 7 %
Neutrophils Absolute: 5.1 10*3/uL (ref 1.4–7.0)
Neutrophils: 79 %
Platelets: 285 10*3/uL (ref 150–379)
RBC: 4.21 x10E6/uL (ref 4.14–5.80)
RDW: 16.9 % — ABNORMAL HIGH (ref 12.3–15.4)
WBC: 6.5 10*3/uL (ref 3.4–10.8)

## 2015-04-30 NOTE — Telephone Encounter (Signed)
Patient's son called concerned about Xarelto. After a complicated couple of hospital stays in May/June 2016, patient started Xarelto for LUE blood clots.  His HgB, HCT dropped and a full GI work-up was done.   Tests were negative and he received 4 units of PRBCs last week. He was discharged June 8th.   Patient's son st that the patient now has "huge blood clots" in his indwelling urinary catheter bag.  He st that this is not new (patient was hospitalized in February 2016 for catheter bleeding), but it is the first time it has happened since starting Xarelto. Instructed the patient to call [facility] and inform them of symptoms as well.

## 2015-04-30 NOTE — Telephone Encounter (Signed)
Per Richardson Dopp, deferred Xarelto concerns to Dr. Mariea Clonts as we have not seen the patient in over a year and she was the ordering provider. Sent Dr. Mariea Clonts a message with patient's concerns.  Patient's son is awaiting call back from [facility] with instruction.  Patient's son agrees with treatment plan.

## 2015-04-30 NOTE — Telephone Encounter (Signed)
Follow Up       Pt's daughter calling stating that she is following up on previous phone call. Please call back and advise.

## 2015-05-01 ENCOUNTER — Other Ambulatory Visit: Payer: Self-pay | Admitting: *Deleted

## 2015-05-01 ENCOUNTER — Other Ambulatory Visit: Payer: Self-pay | Admitting: Internal Medicine

## 2015-05-01 ENCOUNTER — Telehealth: Payer: Self-pay | Admitting: *Deleted

## 2015-05-01 DIAGNOSIS — I82622 Acute embolism and thrombosis of deep veins of left upper extremity: Secondary | ICD-10-CM

## 2015-05-01 NOTE — Telephone Encounter (Signed)
-----   Message from Gayland Curry, DO sent at 04/30/2015  5:44 PM EDT ----- Regarding: RE: Xarelto Questions [place], I was under the impression that xarelto was stopped at the hospital and I could not figure out why it was still on his med list when he came to see me.  They put him on aspirin in its place.  I will copy the CMAs on this so they can call [name] to clarify all of this. Thanks for getting in touch with me. TReed, DO ----- Message -----    From: Theodoro Parma, RN    Sent: 04/30/2015   3:10 PM      To: Gayland Curry, DO Subject: Xarelto Questions                              Hello! My name is Valetta Fuller, I'm a triage nurse at [facility]. This patient's son called today with concerns about Xarelto. He is experiencing bleeding from his indwelling catheter (this does not seem like a new problem since starting Xarelto). I also instructed him to contact [facility]. Richardson Dopp, [place], was the last provider to see this patient at our office over a year ago. So we are deferring any medication instructions to you to make as far as continuing/discontinung Xarelto. The patient's son requests a call from your office with further instruction. I would forward this to your RN/CMA as well but I don't know who that may be!   There is a phone encounter in EPIC as well.  Thank you so much!

## 2015-05-01 NOTE — Telephone Encounter (Signed)
Patient son stated that patient was taken off of the Xarelto for a short time due to his Hemoglobin dropping from 11 to 6. They took him off to do a Endoscopy and placed him back on the Xarelto on Tuesday 04/24/2015. Patient is currently taking and was discharged home with it.

## 2015-05-01 NOTE — Telephone Encounter (Signed)
Ok.  Continue xarelto, but please make sure they are not giving him any aspirin.  The sentence in the discharge summary was dictated and made no sense and I interpreted it to mean xarelto was stopped and he was put on aspirin.  When he completes the current prescription the dosage will change until he has completed the entire 3 month course.

## 2015-05-01 NOTE — Telephone Encounter (Signed)
Called son and he stated that he has already spoken with Ivin Booty twice today regarding this. He understood the directions.

## 2015-05-04 ENCOUNTER — Telehealth: Payer: Self-pay | Admitting: *Deleted

## 2015-05-04 NOTE — Telephone Encounter (Signed)
Ricky [name] with [facility] #: [phone] called and stated that Ricky [name], Daughter called and wants an evaluation for Hospice Monday at 2:30. Margie with Hospice wants to know if you feel this is appropriate and needs verbal order. Please Advise.

## 2015-05-05 NOTE — Telephone Encounter (Signed)
I certainly do agree with a hospice referral.  This has been my recommendation for several months.  They will need to very clearly explain their services and goals to the family b/c they were very confused at the last appt.  Please find out [facility]'s fax info and send them my note so they understand the situation.  Thanks.

## 2015-05-07 NOTE — Telephone Encounter (Signed)
Spoke with Manus Gunning at [facility] and gave her verbal orders. Faxed records to Fax: [phone]

## 2015-05-10 ENCOUNTER — Other Ambulatory Visit: Payer: Self-pay | Admitting: *Deleted

## 2015-05-10 MED ORDER — RIVAROXABAN 20 MG PO TABS: 20.0000 mg | ORAL_TABLET | Freq: Every day | ORAL | Status: DC

## 2015-05-10 NOTE — Telephone Encounter (Signed)
[name] called for a refill on patient's Xarelto. Faxed to pharmacy

## 2015-05-13 ENCOUNTER — Other Ambulatory Visit: Payer: Self-pay | Admitting: Internal Medicine

## 2015-05-14 ENCOUNTER — Other Ambulatory Visit: Payer: Self-pay

## 2015-05-18 ENCOUNTER — Ambulatory Visit (INDEPENDENT_AMBULATORY_CARE_PROVIDER_SITE_OTHER): Payer: Medicare Other | Admitting: Internal Medicine

## 2015-05-18 ENCOUNTER — Ambulatory Visit: Payer: Self-pay | Admitting: Internal Medicine

## 2015-05-18 ENCOUNTER — Encounter: Payer: Self-pay | Admitting: Internal Medicine

## 2015-05-18 VITALS — BP 136/78 | HR 60 | Temp 98.0°F | Resp 12

## 2015-05-18 DIAGNOSIS — L89621 Pressure ulcer of left heel, stage 1: Secondary | ICD-10-CM

## 2015-05-18 DIAGNOSIS — F0280 Dementia in other diseases classified elsewhere without behavioral disturbance: Secondary | ICD-10-CM

## 2015-05-18 DIAGNOSIS — E43 Unspecified severe protein-calorie malnutrition: Secondary | ICD-10-CM | POA: Diagnosis not present

## 2015-05-18 DIAGNOSIS — G309 Alzheimer's disease, unspecified: Secondary | ICD-10-CM | POA: Diagnosis not present

## 2015-05-18 NOTE — Patient Instructions (Addendum)
Keep pressure off left heel. No antibiotic needed at this time. Keep covered when outside home.  Wound care eval by home health  Call office if increased swelling, redness, discharge from wound  Follow up as scheduled  Pressure Ulcer A pressure ulcer is a sore that has formed from the breakdown of skin and exposure of deeper layers of tissue. It develops in areas of the body where there is unrelieved pressure. Pressure ulcers are usually found over a bony area, such as the shoulder blades, spine, lower back, hips, knees, ankles, and heels. Pressure ulcers vary in severity. Your health care provider may determine the severity (stage) of your pressure ulcer. The stages include:  Stage I--The skin is red, and when the skin is pressed, it stays red.  Stage II--The top layer of skin is gone, and there is a shallow, pink ulcer.  Stage III--The ulcer becomes deeper, and it is more difficult to see the whole wound. Also, there may be yellow or brown parts, as well as pink and red parts.  Stage IV--The ulcer may be deep and red, pink, brown, white, or yellow. Bone or muscle may be seen.  Unstageable pressure ulcer--The ulcer is covered almost completely with black, brown, or yellow tissue. It is not known how deep the ulcer is or what stage it is until this covering comes off.  Suspected deep tissue injury--A person's skin can be injured from pressure or pulling on the skin when his or her position is changed. The skin appears purple or maroon. There may not be an opening in the skin, but there could be a blood-filled blister. This deep tissue injury is often difficult to see in people with darker skin tones. The site may open and become deeper in time. However, early interventions will help the area heal and may prevent the area from opening. CAUSES  Pressure ulcers are caused by pressure against the skin that limits the flow of blood to the skin and nearby tissues. There are many risk factors that  can lead to pressure sores. RISK FACTORS  Decreased ability to move.  Decreased ability to feel pain or discomfort.  Excessive skin moisture from urine, stool, sweat, or secretions.  Poor nutrition.  Dehydration.  Tobacco, drug, or alcohol abuse.  Having someone pull on bedsheets that are under you, such as when health care workers are changing your position in a hospital bed.  Obesity.  Increased adult age.  Hospitalization in a critical care unit for longer than 4 days with use of medical devices.  Prolonged use of medical devices.  Critical illness.  Anemia.  Traumatic brain injury.  Spinal cord injury.  Stroke.  Diabetes.  Poor blood glucose control.  Low blood pressure (hypotension).  Low oxygen levels.  Medicines that reduce blood flow.  Infection. DIAGNOSIS  Your health care provider will diagnose your pressure ulcer based on its appearance. The health care provider may determine the stage of your pressure ulcer as well. Tests may be done to check for infection, to assess your circulation, or to check for other diseases, such as diabetes. TREATMENT  Treatment of your pressure ulcer begins with determining what stage the ulcer is in. Your treatment team may include your health care provider, a wound care specialist, a nutritionist, a physical therapist, and a Psychologist, sport and exercise. Possible treatments may include:   Moving or repositioning every 1-2 hours.  Using beds or mattresses to shift your body weight and pressure points frequently.  Improving your diet.  Cleaning  and bandaging (dressing) the open wound.  Giving antibiotic medicines.  Removing damaged tissue.  Surgery and sometimes skin grafts. HOME CARE INSTRUCTIONS  If you were hospitalized, follow the care plan that was started in the hospital.  Avoid staying in the same position for more than 2 hours. Use padding, devices, or mattresses to cushion your pressure points as directed by your health  care provider.  Eat a well-balanced diet. Take nutritional supplements and vitamins as directed by your health care provider.  Keep all follow-up appointments.  Only take over-the-counter or prescription medicines for pain, fever, or discomfort as directed by your health care provider. SEEK MEDICAL CARE IF:   Your pressure ulcer is not improving.  You do not know how to care for your pressure ulcer.  You notice other areas of redness on your skin.  You have a fever. SEEK IMMEDIATE MEDICAL CARE IF:   You have increasing redness, swelling, or pain in your pressure ulcer.  You notice pus coming from your pressure ulcer.  You notice a bad smell coming from the wound or dressing.  Your pressure ulcer opens up again. Document Released: 11/03/2005 Document Revised: 11/08/2013 Document Reviewed: 07/11/2013 [facility] Patient Information 2015 [place], [place]. This information is not intended to replace advice given to you by your health care provider. Make sure you discuss any questions you have with your health care provider.

## 2015-05-18 NOTE — Progress Notes (Signed)
Patient ID: Ricky [name], male   DOB: January 24, 1917, 81 y.o.   MRN: [id number]    Location:    [facility]   Place of Service:   OFFICE  Chief Complaint  Patient presents with  . Acute Visit    Wound on left heel since 3 Friday's ago. Patient with no expression of pain in area     HPI:  81 yo male seen today for left heel sore x 3 weeks. Pt is a poor historian due to dementia. Hx obtained form son. HH noticed blistering sore 3 weeks ago. The skin broke on yesterday. No d/c. No pain. Family requesting wound care eval  He takes namenda for dementia  He is taking xeralto for hx PE and DVT  He has adult FTT due to dementia. Per son, he eats well most days.   Daughter also present today  Past Medical History  Diagnosis Date  . IBS (irritable bowel syndrome)   . Diverticulosis   . Cataract   . OAB (overactive bladder)   . Macular degeneration   . Hypertension   . Parkinsonian syndrome   . Syncope, vasovagal   . 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 Unspecified transient cerebral ischemia   . Unspecified urinary incontinence   . Hypothyroidism   . Anemia, unspecified   . Depression   . Alzheimer's disease   . Gout, unspecified   . Thoracic or lumbosacral neuritis or radiculitis, unspecified   . Progressive supranuclear palsy   . Paralysis agitans   . Pneumonitis due to inhalation of food or vomitus   . Cellulitis and abscess of oral soft tissues   . Unspecified hypothyroidism   . Pneumonia, organism unspecified   . Pseudomonas infection in conditions classified elsewhere and of unspecified site   . Urinary tract infection, site not specified   . Osteoarthrosis, unspecified whether generalized or localized, unspecified site   . Osteoporosis, unspecified   . Gout, unspecified   . Thoracic or lumbosacral neuritis or radiculitis, unspecified   . Stroke     Past Surgical History  Procedure Laterality Date  . Esophagogastroduodenoscopy    . Eye  surgery  2006    LEFT CATARACT  . Esophagogastroduodenoscopy N/A 04/24/2015    Procedure: ESOPHAGOGASTRODUODENOSCOPY (EGD);  Surgeon: Carol Ada, MD;  Location: [facility] ENDOSCOPY;  Service: Endoscopy;  Laterality: N/A;    Patient Care Team: Gayland Curry, DO as PCP - General (Geriatric Medicine)  History   Social History  . Marital Status: Single    Spouse Name: N/A  . Number of Children: N/A  . Years of Education: N/A   Occupational History  . Not on file.   Social History Main Topics  . Smoking status: Former Research scientist (life sciences)  . Smokeless tobacco: Never Used     Comment: QUIT SMOKING BACK IN THE LATE 50'S  . Alcohol Use: No  . Drug Use: No  . Sexual Activity: No   Other Topics Concern  . Not on file   Social History Narrative     reports that he has quit smoking. He has never used smokeless tobacco. He reports that he does not drink alcohol or use illicit drugs.  Allergies  Allergen Reactions  . Other Other (See Comments)    Dairy products- cause swallowing difficulty     Medications: Patient's Medications  New Prescriptions   No medications on file  Previous Medications   ACETAMINOPHEN (TYLENOL) 500 MG TABLET    Take 1,000 mg  by mouth 3 (three) times daily.    AMBULATORY NON FORMULARY MEDICATION    Pressure Air Mattress Dx: G60.8, L89.150   BACITRACIN OINTMENT    Apply 1 application topically 2 (two) times daily. Applies to head of penis to decrease infection from catheter.   CALCIUM CARBONATE-VIT D-MIN (CALTRATE 600+D PLUS) 600-800 MG-UNIT CHEW    Chew 1 tablet by mouth 2 (two) times daily with a meal.    CRANBERRY SOFT PO    Take 2 capsules by mouth 3 (three) times daily.    CYANOCOBALAMIN (VITAMIN B-12) 5000 MCG SUBL    Place 5,000 mcg under the tongue 2 (two) times daily.   FERROUS SULFATE 325 (65 FE) MG TABLET    Take 325 mg by mouth every other day.    GUAIFENESIN (MUCINEX) 600 MG 12 HR TABLET    Take 600 mg by mouth 2 (two) times daily as needed for cough.    HYDRALAZINE (APRESOLINE) 25 MG TABLET    Take 1/2 tablet by mouth twice daily to control blood pressure   LACTOSE FREE NUTRITION (BOOST) LIQD    Take 90 mLs by mouth 3 (three) times daily between meals.   LEVOTHYROXINE (SYNTHROID, LEVOTHROID) 25 MCG TABLET    TAKE 1 TABLET BY MOUTH DAILY   LIDOCAINE (LIDODERM) 5 %    PLACE 3 PATCHES ONTO THE SKIN DAILY. REMOVE & DISCARD PATCH WITHIN 12 HOURS OR AS DIRECTED BY MD   MELATONIN 5 MG TABS    Take 5 mg by mouth at bedtime as needed (For sleep.).   MEMANTINE (NAMENDA) 10 MG TABLET    Take 10 mg by mouth 2 (two) times daily.   MULTIPLE VITAMINS-MINERALS (ICAPS AREDS FORMULA PO)    Take 2 capsules by mouth 2 (two) times daily with a meal.    NON FORMULARY    D-Mannose, Take 2 teaspoon by mouth with fluids 2 times a day   PANTOPRAZOLE (PROTONIX) 40 MG TABLET    Take 1 tablet (40 mg total) by mouth daily.   POLYETHYLENE GLYCOL (MIRALAX / GLYCOLAX) PACKET    Take 17 g by mouth daily as needed for mild constipation.   PROBIOTIC PRODUCT (PHILLIPS COLON HEALTH) CAPS    Take 1 capsule by mouth 2 (two) times daily.   PSYLLIUM (METAMUCIL) 58.6 % PACKET    2 tablespoons two times daily   RIVAROXABAN (XARELTO) 20 MG TABS TABLET    Take 1 tablet (20 mg total) by mouth daily with supper.   VOLTAREN 1 % GEL    Apply 2 g topically 4 (four) times daily.   Modified Medications   No medications on file  Discontinued Medications   No medications on file    Review of Systems  Unable to perform ROS: Dementia    Filed Vitals:   05/18/15 1404  BP: 136/78  Pulse: 60  Temp: 98 F (36.7 C)  TempSrc: Oral  Resp: 12  SpO2: 95%   There is no weight on file to calculate BMI.  Physical Exam  Constitutional: He appears well-developed and well-nourished. No distress.  Sitting in w/c  Cardiovascular: Intact distal pulses.   Pulses:      Dorsalis pedis pulses are 2+ on the left side.       Posterior tibial pulses are 2+ on the left side.  Trace LE swelling b/l. No  calf TTP. No achilles tendon TTP  Neurological: He is alert.  Skin: Skin is warm and dry.  Left heel stage 1 pressure  sore about 25 cent sized. No d/c. Dry. Erythema noted. No increased warmth to touch. NT  Psychiatric: He has a normal mood and affect. His behavior is normal.     Labs reviewed: Office Visit on 04/27/2015  Component Date Value Ref Range Status  . WBC 04/27/2015 6.5  3.4 - 10.8 x10E3/uL Final  . RBC 04/27/2015 4.21  4.14 - 5.80 x10E6/uL Final  . Hemoglobin 04/27/2015 12.0* 12.6 - 17.7 g/dL Final  . Hematocrit 04/27/2015 36.6* 37.5 - 51.0 % Final  . MCV 04/27/2015 87  79 - 97 fL Final  . MCH 04/27/2015 28.5  26.6 - 33.0 pg Final  . MCHC 04/27/2015 32.8  31.5 - 35.7 g/dL Final  . RDW 04/27/2015 16.9* 12.3 - 15.4 % Final  . Platelets 04/27/2015 285  150 - 379 x10E3/uL Final  . NEUTROPHILS 04/27/2015 79   Final  . Lymphs 04/27/2015 14   Final  . Monocytes 04/27/2015 7   Final  . Eos 04/27/2015 0   Final  . Basos 04/27/2015 0   Final  . Neutrophils Absolute 04/27/2015 5.1  1.4 - 7.0 x10E3/uL Final  . Lymphocytes Absolute 04/27/2015 0.9  0.7 - 3.1 x10E3/uL Final  . Monocytes Absolute 04/27/2015 0.5  0.1 - 0.9 x10E3/uL Final  . EOS (ABSOLUTE) 04/27/2015 0.0  0.0 - 0.4 x10E3/uL Final  . Basophils Absolute 04/27/2015 0.0  0.0 - 0.2 x10E3/uL Final  . Immature Granulocytes 04/27/2015 0   Final  . Immature Grans (Abs) 04/27/2015 0.0  0.0 - 0.1 x10E3/uL Final  Admission on 04/22/2015, Discharged on 04/25/2015  No results displayed because visit has over 200 results.    Admission on 03/31/2015, Discharged on 04/13/2015  No results displayed because visit has over 200 results.    Admission on 03/25/2015, Discharged on 03/30/2015  Component Date Value Ref Range Status  . WBC 03/25/2015 7.7  4.0 - 10.5 K/uL Final  . RBC 03/25/2015 4.57  4.22 - 5.81 MIL/uL Final  . Hemoglobin 03/25/2015 13.0  13.0 - 17.0 g/dL Final  . HCT 03/25/2015 40.9  39.0 - 52.0 % Final  . MCV 03/25/2015  89.5  78.0 - 100.0 fL Final  . MCH 03/25/2015 28.4  26.0 - 34.0 pg Final  . MCHC 03/25/2015 31.8  30.0 - 36.0 g/dL Final  . RDW 03/25/2015 14.0  11.5 - 15.5 % Final  . Platelets 03/25/2015 213  150 - 400 K/uL Final  . Neutrophils Relative % 03/25/2015 81* 43 - 77 % Final  . Neutro Abs 03/25/2015 6.2  1.7 - 7.7 K/uL Final  . Lymphocytes Relative 03/25/2015 14  12 - 46 % Final  . Lymphs Abs 03/25/2015 1.1  0.7 - 4.0 K/uL Final  . Monocytes Relative 03/25/2015 5  3 - 12 % Final  . Monocytes Absolute 03/25/2015 0.4  0.1 - 1.0 K/uL Final  . Eosinophils Relative 03/25/2015 0  0 - 5 % Final  . Eosinophils Absolute 03/25/2015 0.0  0.0 - 0.7 K/uL Final  . Basophils Relative 03/25/2015 0  0 - 1 % Final  . Basophils Absolute 03/25/2015 0.0  0.0 - 0.1 K/uL Final  . Sodium 03/25/2015 137  135 - 145 mmol/L Final  . Potassium 03/25/2015 4.2  3.5 - 5.1 mmol/L Final  . Chloride 03/25/2015 101  101 - 111 mmol/L Final  . CO2 03/25/2015 27  22 - 32 mmol/L Final  . Glucose, Bld 03/25/2015 100* 70 - 99 mg/dL Final  . BUN 03/25/2015 24* 6 - 20 mg/dL  Final  . Creatinine, Ser 03/25/2015 1.14  0.61 - 1.24 mg/dL Final  . Calcium 03/25/2015 9.4  8.9 - 10.3 mg/dL Final  . GFR calc non Af Amer 03/25/2015 53* >60 mL/min Final  . GFR calc Af Amer 03/25/2015 >60  >60 mL/min Final   Comment: (NOTE) The eGFR has been calculated using the CKD EPI equation. This calculation has not been validated in all clinical situations. eGFR's persistently <60 mL/min signify possible Chronic Kidney Disease.   . Anion gap 03/25/2015 9  5 - 15 Final  . Color, Urine 03/25/2015 AMBER* YELLOW Final   BIOCHEMICALS MAY BE AFFECTED BY COLOR  . APPearance 03/25/2015 TURBID* CLEAR Final  . Specific Gravity, Urine 03/25/2015 1.021  1.005 - 1.030 Final  . pH 03/25/2015 7.5  5.0 - 8.0 Final  . Glucose, UA 03/25/2015 NEGATIVE  NEGATIVE mg/dL Final  . Hgb urine dipstick 03/25/2015 LARGE* NEGATIVE Final  . Bilirubin Urine 03/25/2015 NEGATIVE   NEGATIVE Final  . Ketones, ur 03/25/2015 NEGATIVE  NEGATIVE mg/dL Final  . Protein, ur 03/25/2015 100* NEGATIVE mg/dL Final  . Urobilinogen, UA 03/25/2015 1.0  0.0 - 1.0 mg/dL Final  . Nitrite 03/25/2015 NEGATIVE  NEGATIVE Final  . Leukocytes, UA 03/25/2015 LARGE* NEGATIVE Final  . Specimen Description 03/25/2015 URINE, CATHETERIZED   Final  . Special Requests 03/25/2015 NONE   Final  . Colony Count 03/25/2015    Final                   Value:>=100,000 COLONIES/ML Performed at [facility]   . Culture 03/25/2015    Final                   Value:PSEUDOMONAS AERUGINOSA Performed at [facility]   . Report Status 03/25/2015 03/27/2015 FINAL   Final  . Organism ID, Bacteria 03/25/2015 PSEUDOMONAS AERUGINOSA   Final  . WBC, UA 03/25/2015 TOO NUMEROUS TO COUNT  <3 WBC/hpf Final  . RBC / HPF 03/25/2015 TOO NUMEROUS TO COUNT  <3 RBC/hpf Final  . Bacteria, UA 03/25/2015 MANY* RARE Final  . Crystals 03/25/2015 TRIPLE PHOSPHATE CRYSTALS* NEGATIVE Final  . Urine-Other 03/25/2015 LESS THAN 10 mL OF URINE SUBMITTED   Final   MICROSCOPIC EXAM PERFORMED ON UNCONCENTRATED URINE  . TSH 03/25/2015 1.228  0.350 - 4.500 uIU/mL Final  . WBC 03/26/2015 5.4  4.0 - 10.5 K/uL Final  . RBC 03/26/2015 3.90* 4.22 - 5.81 MIL/uL Final  . Hemoglobin 03/26/2015 11.2* 13.0 - 17.0 g/dL Final  . HCT 03/26/2015 34.7* 39.0 - 52.0 % Final  . MCV 03/26/2015 89.0  78.0 - 100.0 fL Final  . MCH 03/26/2015 28.7  26.0 - 34.0 pg Final  . MCHC 03/26/2015 32.3  30.0 - 36.0 g/dL Final  . RDW 03/26/2015 14.1  11.5 - 15.5 % Final  . Platelets 03/26/2015 195  150 - 400 K/uL Final  . Sodium 03/26/2015 138  135 - 145 mmol/L Final  . Potassium 03/26/2015 3.6  3.5 - 5.1 mmol/L Final  . Chloride 03/26/2015 107  101 - 111 mmol/L Final  . CO2 03/26/2015 23  22 - 32 mmol/L Final  . Glucose, Bld 03/26/2015 80  70 - 99 mg/dL Final  . BUN 03/26/2015 18  6 - 20 mg/dL Final  . Creatinine, Ser 03/26/2015 0.71  0.61 - 1.24  mg/dL Final  . Calcium 03/26/2015 8.8* 8.9 - 10.3 mg/dL Final  . GFR calc non Af Amer 03/26/2015 >60  >60 mL/min Final  .  GFR calc Af Amer 03/26/2015 >60  >60 mL/min Final   Comment: (NOTE) The eGFR has been calculated using the CKD EPI equation. This calculation has not been validated in all clinical situations. eGFR's persistently <60 mL/min signify possible Chronic Kidney Disease.   . Anion gap 03/26/2015 8  5 - 15 Final  . WBC 03/29/2015 4.8  4.0 - 10.5 K/uL Final  . RBC 03/29/2015 3.90* 4.22 - 5.81 MIL/uL Final  . Hemoglobin 03/29/2015 10.9* 13.0 - 17.0 g/dL Final  . HCT 03/29/2015 34.9* 39.0 - 52.0 % Final  . MCV 03/29/2015 89.5  78.0 - 100.0 fL Final  . MCH 03/29/2015 27.9  26.0 - 34.0 pg Final  . MCHC 03/29/2015 31.2  30.0 - 36.0 g/dL Final  . RDW 03/29/2015 14.2  11.5 - 15.5 % Final  . Platelets 03/29/2015 192  150 - 400 K/uL Final  . Sodium 03/29/2015 137  135 - 145 mmol/L Final  . Potassium 03/29/2015 3.5  3.5 - 5.1 mmol/L Final  . Chloride 03/29/2015 103  101 - 111 mmol/L Final  . CO2 03/29/2015 27  22 - 32 mmol/L Final  . Glucose, Bld 03/29/2015 84  65 - 99 mg/dL Final  . BUN 03/29/2015 6  6 - 20 mg/dL Final  . Creatinine, Ser 03/29/2015 0.57* 0.61 - 1.24 mg/dL Final  . Calcium 03/29/2015 8.5* 8.9 - 10.3 mg/dL Final  . GFR calc non Af Amer 03/29/2015 >60  >60 mL/min Final  . GFR calc Af Amer 03/29/2015 >60  >60 mL/min Final   Comment: (NOTE) The eGFR has been calculated using the CKD EPI equation. This calculation has not been validated in all clinical situations. eGFR's persistently <60 mL/min signify possible Chronic Kidney Disease.   . Anion gap 03/29/2015 7  5 - 15 Final    Ct Abdomen Pelvis Wo Contrast  04/22/2015   CLINICAL DATA:  81 year old male with anemia and UTI. Decreased urine output. Evaluate for retroperitoneal bleed.  EXAM: CT ABDOMEN AND PELVIS WITHOUT CONTRAST  TECHNIQUE: Multidetector CT imaging of the abdomen and pelvis was performed following  the standard protocol without IV contrast.  COMPARISON:  No prior CT.  FINDINGS: The heart is enlarged. Volume loss in the lung bases with likely atelectasis in the left greater than right lower lobe.  No stranding in the retroperitoneum to suggest retroperitoneal bleed.  Evaluation of the solid and hollow viscera is limited given lack of contrast, paucity of intra-abdominal fat, and arms down positioning.  Homogeneous attenuation to the liver, no focal lesion allowing for noncontrast exam. Gallbladder is decompressed. The unenhanced spleen is normal in size. The pancreas is not well-defined, no definite peripancreatic inflammatory change. The adrenal glands are not well defined. There is no hydronephrosis. No renal stones. Limited bowel assessment, no small bowel dilatation. There is equivocal gastric wall thickening. Moderate stool throughout the colon. Suspect air-filled appendix in the right lower quadrant.  Diffuse atherosclerosis of the abdominal aorta without aneurysm. Decreased density of blood pool consistent with anemia.  Fluid-filled bowel loops in the pelvis. Urinary bladder is distended with irregular wall, Foley catheter is in place. Prostate gland appears enlarged.  No definite free air free fluid in the abdomen or pelvis. Mild whole body wall edema.  There is diffuse bony under mineralization. Sequela of right superior inferior pubic rami fractures. Compression deformity of L1.  IMPRESSION: 1. No evidence of retroperitoneal bleed. 2. Urinary bladder is distended with irregular wall, Foley catheter in place. This may be due to  chronic bladder outlet obstruction, prostate gland enlarged. 3. Evaluation of the remaining intra-abdominal viscera is limited given lack contrast and paucity of intra-abdominal fat. Equivocal gastric wall thickening. There is dense atherosclerosis of the aorta and its branches without aneurysm. Cardiomegaly. 4. Whole body wall edema, suspect third-spacing.   Electronically  Signed   By: Jeb Levering M.D.   On: 04/22/2015 22:20   Dg Chest 1 View  04/19/2015   CLINICAL DATA:  Shortness of breath, edema.  EXAM: CHEST  1 VIEW  COMPARISON:  Mar 31, 2015.  FINDINGS: Stable cardiomediastinal silhouette. No pneumothorax or significant pleural effusion is noted. Moderate dextroscoliosis of lower thoracic spine is noted which is unchanged compared to prior exam. No acute pulmonary disease is noted. Bony thorax is intact.  IMPRESSION: No acute cardiopulmonary abnormality seen.   Electronically Signed   By: Marijo Conception, M.D.   On: 04/19/2015 08:31   Dg Chest 2 View  04/22/2015   CLINICAL DATA:  Fever.  EXAM: CHEST  2 VIEW  COMPARISON:  04/18/2015  FINDINGS: Dissemination is quite limited due to the position of the patient has arms which are obscuring most of the chest. The heart is enlarged. There is tortuosity and calcification of the thoracic aorta. The lungs are grossly clear.  IMPRESSION: Very limited examination.  No obvious/gross acute pulmonary process   Electronically Signed   By: Marijo Sanes M.D.   On: 04/22/2015 16:58     Assessment/Plan   ICD-9-CM ICD-10-CM   1. Pressure ulcer, heel, left, stage I 707.07 L89.621    707.21    2. Protein-calorie malnutrition, severe 262 E43   3. ALZHEIMERS DISEASE - unchanged 331.0 G30.9    --Keep pressure off left heel. No antibiotic needed at this time. Keep covered when outside home.   --education handout for pressure ulcers given  --Wound care eval by home health  --Call office if increased swelling, redness, discharge from wound  --Follow up as scheduled  Pedro Whiters S. Perlie Gold  [facility] [address] [place], [place] [address] [phone] Cell (Monday-Friday 8 AM - 5 PM) [phone] After 5 PM and follow prompts

## 2015-05-24 ENCOUNTER — Ambulatory Visit: Payer: 59 | Admitting: Neurology

## 2015-05-31 ENCOUNTER — Inpatient Hospital Stay (HOSPITAL_COMMUNITY)
Admission: EM | Admit: 2015-05-31 | Discharge: 2015-06-04 | DRG: 091 | Disposition: A | Discharge status: Home Health | Payer: Medicare Other | Attending: Internal Medicine | Admitting: Internal Medicine

## 2015-05-31 ENCOUNTER — Emergency Department (HOSPITAL_COMMUNITY): Payer: Medicare Other

## 2015-05-31 ENCOUNTER — Encounter (HOSPITAL_COMMUNITY): Payer: Self-pay | Admitting: Adult Health

## 2015-05-31 DIAGNOSIS — M109 Gout, unspecified: Secondary | ICD-10-CM | POA: Diagnosis present

## 2015-05-31 DIAGNOSIS — Z91011 Allergy to milk products: Secondary | ICD-10-CM

## 2015-05-31 DIAGNOSIS — G92 Toxic encephalopathy: Secondary | ICD-10-CM | POA: Diagnosis not present

## 2015-05-31 DIAGNOSIS — G231 Progressive supranuclear ophthalmoplegia [Steele-Richardson-Olszewski]: Secondary | ICD-10-CM | POA: Diagnosis present

## 2015-05-31 DIAGNOSIS — F0390 Unspecified dementia without behavioral disturbance: Secondary | ICD-10-CM | POA: Diagnosis present

## 2015-05-31 DIAGNOSIS — Z823 Family history of stroke: Secondary | ICD-10-CM

## 2015-05-31 DIAGNOSIS — Z7401 Bed confinement status: Secondary | ICD-10-CM

## 2015-05-31 DIAGNOSIS — L89150 Pressure ulcer of sacral region, unstageable: Secondary | ICD-10-CM | POA: Diagnosis present

## 2015-05-31 DIAGNOSIS — G3183 Dementia with Lewy bodies: Secondary | ICD-10-CM | POA: Diagnosis present

## 2015-05-31 DIAGNOSIS — Z515 Encounter for palliative care: Secondary | ICD-10-CM | POA: Diagnosis present

## 2015-05-31 DIAGNOSIS — Z825 Family history of asthma and other chronic lower respiratory diseases: Secondary | ICD-10-CM

## 2015-05-31 DIAGNOSIS — B952 Enterococcus as the cause of diseases classified elsewhere: Secondary | ICD-10-CM | POA: Diagnosis present

## 2015-05-31 DIAGNOSIS — R4182 Altered mental status, unspecified: Secondary | ICD-10-CM | POA: Diagnosis not present

## 2015-05-31 DIAGNOSIS — D509 Iron deficiency anemia, unspecified: Secondary | ICD-10-CM | POA: Diagnosis present

## 2015-05-31 DIAGNOSIS — M1990 Unspecified osteoarthritis, unspecified site: Secondary | ICD-10-CM | POA: Diagnosis present

## 2015-05-31 DIAGNOSIS — I82629 Acute embolism and thrombosis of deep veins of unspecified upper extremity: Secondary | ICD-10-CM | POA: Diagnosis present

## 2015-05-31 DIAGNOSIS — F329 Major depressive disorder, single episode, unspecified: Secondary | ICD-10-CM | POA: Diagnosis present

## 2015-05-31 DIAGNOSIS — R4702 Dysphasia: Secondary | ICD-10-CM | POA: Diagnosis present

## 2015-05-31 DIAGNOSIS — I82402 Acute embolism and thrombosis of unspecified deep veins of left lower extremity: Secondary | ICD-10-CM

## 2015-05-31 DIAGNOSIS — N3281 Overactive bladder: Secondary | ICD-10-CM | POA: Diagnosis present

## 2015-05-31 DIAGNOSIS — M810 Age-related osteoporosis without current pathological fracture: Secondary | ICD-10-CM | POA: Diagnosis present

## 2015-05-31 DIAGNOSIS — Z66 Do not resuscitate: Secondary | ICD-10-CM | POA: Diagnosis present

## 2015-05-31 DIAGNOSIS — R1310 Dysphagia, unspecified: Secondary | ICD-10-CM | POA: Diagnosis present

## 2015-05-31 DIAGNOSIS — N390 Urinary tract infection, site not specified: Secondary | ICD-10-CM | POA: Diagnosis present

## 2015-05-31 DIAGNOSIS — Z7901 Long term (current) use of anticoagulants: Secondary | ICD-10-CM

## 2015-05-31 DIAGNOSIS — F0280 Dementia in other diseases classified elsewhere without behavioral disturbance: Secondary | ICD-10-CM | POA: Diagnosis present

## 2015-05-31 DIAGNOSIS — Z8673 Personal history of transient ischemic attack (TIA), and cerebral infarction without residual deficits: Secondary | ICD-10-CM

## 2015-05-31 DIAGNOSIS — K589 Irritable bowel syndrome without diarrhea: Secondary | ICD-10-CM | POA: Diagnosis present

## 2015-05-31 DIAGNOSIS — N182 Chronic kidney disease, stage 2 (mild): Secondary | ICD-10-CM | POA: Diagnosis present

## 2015-05-31 DIAGNOSIS — Z809 Family history of malignant neoplasm, unspecified: Secondary | ICD-10-CM

## 2015-05-31 DIAGNOSIS — Z87891 Personal history of nicotine dependence: Secondary | ICD-10-CM

## 2015-05-31 DIAGNOSIS — H3530 Unspecified macular degeneration: Secondary | ICD-10-CM | POA: Diagnosis present

## 2015-05-31 DIAGNOSIS — R32 Unspecified urinary incontinence: Secondary | ICD-10-CM | POA: Diagnosis present

## 2015-05-31 DIAGNOSIS — G309 Alzheimer's disease, unspecified: Secondary | ICD-10-CM | POA: Diagnosis present

## 2015-05-31 DIAGNOSIS — Z86718 Personal history of other venous thrombosis and embolism: Secondary | ICD-10-CM

## 2015-05-31 DIAGNOSIS — G9340 Encephalopathy, unspecified: Secondary | ICD-10-CM | POA: Diagnosis present

## 2015-05-31 DIAGNOSIS — G929 Unspecified toxic encephalopathy: Secondary | ICD-10-CM | POA: Diagnosis present

## 2015-05-31 DIAGNOSIS — Z79899 Other long term (current) drug therapy: Secondary | ICD-10-CM

## 2015-05-31 DIAGNOSIS — E43 Unspecified severe protein-calorie malnutrition: Secondary | ICD-10-CM | POA: Diagnosis present

## 2015-05-31 DIAGNOSIS — Z86711 Personal history of pulmonary embolism: Secondary | ICD-10-CM

## 2015-05-31 DIAGNOSIS — B965 Pseudomonas (aeruginosa) (mallei) (pseudomallei) as the cause of diseases classified elsewhere: Secondary | ICD-10-CM | POA: Diagnosis present

## 2015-05-31 DIAGNOSIS — I129 Hypertensive chronic kidney disease with stage 1 through stage 4 chronic kidney disease, or unspecified chronic kidney disease: Secondary | ICD-10-CM | POA: Diagnosis present

## 2015-05-31 DIAGNOSIS — E039 Hypothyroidism, unspecified: Secondary | ICD-10-CM | POA: Diagnosis present

## 2015-05-31 LAB — COMPREHENSIVE METABOLIC PANEL
ALK PHOS: 64 U/L (ref 38–126)
ALT: 22 U/L (ref 17–63)
AST: 31 U/L (ref 15–41)
Albumin: 3.5 g/dL (ref 3.5–5.0)
Anion gap: 10 (ref 5–15)
BUN: 27 mg/dL — ABNORMAL HIGH (ref 6–20)
CHLORIDE: 99 mmol/L — AB (ref 101–111)
CO2: 22 mmol/L (ref 22–32)
Calcium: 9.3 mg/dL (ref 8.9–10.3)
Creatinine, Ser: 1.04 mg/dL (ref 0.61–1.24)
GFR calc Af Amer: >60 mL/min (ref >60)
GFR calc non Af Amer: 59 mL/min — ABNORMAL LOW (ref >60)
Glucose, Bld: 121 mg/dL — ABNORMAL HIGH (ref 65–99)
POTASSIUM: 4.4 mmol/L (ref 3.5–5.1)
SODIUM: 131 mmol/L — AB (ref 135–145)
Total Bilirubin: 0.5 mg/dL (ref 0.3–1.2)
Total Protein: 7.4 g/dL (ref 6.5–8.1)

## 2015-05-31 LAB — CBG MONITORING, ED: GLUCOSE-CAPILLARY: 132 mg/dL — AB (ref 65–99)

## 2015-05-31 NOTE — ED Notes (Signed)
Patient transported to X-ray 

## 2015-05-31 NOTE — ED Notes (Signed)
CBG 132. 

## 2015-05-31 NOTE — ED Notes (Signed)
Presents with altered mental status-per family-pt had a episode of unresponsiveness where he was slouched over and"not there for a few moments" began about 1 1/2 hours ago. Hx of neurological disorder and nonverbal since June.  Son is at bedside0-worried he has aspirated as well-has issues swallowing. Pt will open eyes when spoken to too which is normal communication for pt.

## 2015-05-31 NOTE — ED Notes (Signed)
Bed: WA21 Expected date:  Expected time:  Means of arrival:  Comments: EMS 81 yo male altered LOC/hx dementia-from home/responsive to some verbal/painful stimuli

## 2015-06-01 ENCOUNTER — Encounter (HOSPITAL_COMMUNITY): Payer: Self-pay | Admitting: Emergency Medicine

## 2015-06-01 ENCOUNTER — Inpatient Hospital Stay (HOSPITAL_COMMUNITY): Payer: Medicare Other

## 2015-06-01 DIAGNOSIS — E039 Hypothyroidism, unspecified: Secondary | ICD-10-CM | POA: Diagnosis present

## 2015-06-01 DIAGNOSIS — G3183 Dementia with Lewy bodies: Secondary | ICD-10-CM | POA: Diagnosis present

## 2015-06-01 DIAGNOSIS — E43 Unspecified severe protein-calorie malnutrition: Secondary | ICD-10-CM | POA: Diagnosis not present

## 2015-06-01 DIAGNOSIS — N3281 Overactive bladder: Secondary | ICD-10-CM | POA: Diagnosis present

## 2015-06-01 DIAGNOSIS — R4182 Altered mental status, unspecified: Secondary | ICD-10-CM | POA: Diagnosis present

## 2015-06-01 DIAGNOSIS — Z7901 Long term (current) use of anticoagulants: Secondary | ICD-10-CM | POA: Diagnosis not present

## 2015-06-01 DIAGNOSIS — Z66 Do not resuscitate: Secondary | ICD-10-CM | POA: Diagnosis present

## 2015-06-01 DIAGNOSIS — L89150 Pressure ulcer of sacral region, unstageable: Secondary | ICD-10-CM

## 2015-06-01 DIAGNOSIS — Z87891 Personal history of nicotine dependence: Secondary | ICD-10-CM | POA: Diagnosis not present

## 2015-06-01 DIAGNOSIS — B952 Enterococcus as the cause of diseases classified elsewhere: Secondary | ICD-10-CM | POA: Diagnosis present

## 2015-06-01 DIAGNOSIS — B965 Pseudomonas (aeruginosa) (mallei) (pseudomallei) as the cause of diseases classified elsewhere: Secondary | ICD-10-CM | POA: Diagnosis present

## 2015-06-01 DIAGNOSIS — K589 Irritable bowel syndrome without diarrhea: Secondary | ICD-10-CM | POA: Diagnosis present

## 2015-06-01 DIAGNOSIS — G231 Progressive supranuclear ophthalmoplegia [Steele-Richardson-Olszewski]: Secondary | ICD-10-CM | POA: Diagnosis not present

## 2015-06-01 DIAGNOSIS — I82402 Acute embolism and thrombosis of unspecified deep veins of left lower extremity: Secondary | ICD-10-CM | POA: Diagnosis not present

## 2015-06-01 DIAGNOSIS — N182 Chronic kidney disease, stage 2 (mild): Secondary | ICD-10-CM | POA: Diagnosis present

## 2015-06-01 DIAGNOSIS — Z7401 Bed confinement status: Secondary | ICD-10-CM | POA: Diagnosis not present

## 2015-06-01 DIAGNOSIS — H3530 Unspecified macular degeneration: Secondary | ICD-10-CM | POA: Diagnosis present

## 2015-06-01 DIAGNOSIS — Z825 Family history of asthma and other chronic lower respiratory diseases: Secondary | ICD-10-CM | POA: Diagnosis not present

## 2015-06-01 DIAGNOSIS — G92 Toxic encephalopathy: Secondary | ICD-10-CM | POA: Diagnosis present

## 2015-06-01 DIAGNOSIS — F0280 Dementia in other diseases classified elsewhere without behavioral disturbance: Secondary | ICD-10-CM | POA: Diagnosis present

## 2015-06-01 DIAGNOSIS — M109 Gout, unspecified: Secondary | ICD-10-CM | POA: Diagnosis present

## 2015-06-01 DIAGNOSIS — N390 Urinary tract infection, site not specified: Secondary | ICD-10-CM

## 2015-06-01 DIAGNOSIS — F0390 Unspecified dementia without behavioral disturbance: Secondary | ICD-10-CM | POA: Diagnosis not present

## 2015-06-01 DIAGNOSIS — Z86711 Personal history of pulmonary embolism: Secondary | ICD-10-CM | POA: Diagnosis not present

## 2015-06-01 DIAGNOSIS — Z515 Encounter for palliative care: Secondary | ICD-10-CM | POA: Diagnosis present

## 2015-06-01 DIAGNOSIS — Z86718 Personal history of other venous thrombosis and embolism: Secondary | ICD-10-CM | POA: Diagnosis not present

## 2015-06-01 DIAGNOSIS — M810 Age-related osteoporosis without current pathological fracture: Secondary | ICD-10-CM | POA: Diagnosis present

## 2015-06-01 DIAGNOSIS — G9340 Encephalopathy, unspecified: Secondary | ICD-10-CM

## 2015-06-01 DIAGNOSIS — F329 Major depressive disorder, single episode, unspecified: Secondary | ICD-10-CM | POA: Diagnosis present

## 2015-06-01 DIAGNOSIS — R1310 Dysphagia, unspecified: Secondary | ICD-10-CM | POA: Diagnosis present

## 2015-06-01 DIAGNOSIS — I82629 Acute embolism and thrombosis of deep veins of unspecified upper extremity: Secondary | ICD-10-CM

## 2015-06-01 DIAGNOSIS — R32 Unspecified urinary incontinence: Secondary | ICD-10-CM | POA: Diagnosis present

## 2015-06-01 DIAGNOSIS — Z809 Family history of malignant neoplasm, unspecified: Secondary | ICD-10-CM | POA: Diagnosis not present

## 2015-06-01 DIAGNOSIS — Z8673 Personal history of transient ischemic attack (TIA), and cerebral infarction without residual deficits: Secondary | ICD-10-CM | POA: Diagnosis not present

## 2015-06-01 DIAGNOSIS — I129 Hypertensive chronic kidney disease with stage 1 through stage 4 chronic kidney disease, or unspecified chronic kidney disease: Secondary | ICD-10-CM | POA: Diagnosis present

## 2015-06-01 DIAGNOSIS — R4702 Dysphasia: Secondary | ICD-10-CM | POA: Diagnosis present

## 2015-06-01 DIAGNOSIS — D509 Iron deficiency anemia, unspecified: Secondary | ICD-10-CM | POA: Diagnosis present

## 2015-06-01 DIAGNOSIS — G309 Alzheimer's disease, unspecified: Secondary | ICD-10-CM | POA: Diagnosis present

## 2015-06-01 DIAGNOSIS — Z91011 Allergy to milk products: Secondary | ICD-10-CM | POA: Diagnosis not present

## 2015-06-01 DIAGNOSIS — M1990 Unspecified osteoarthritis, unspecified site: Secondary | ICD-10-CM | POA: Diagnosis present

## 2015-06-01 DIAGNOSIS — Z823 Family history of stroke: Secondary | ICD-10-CM | POA: Diagnosis not present

## 2015-06-01 DIAGNOSIS — Z79899 Other long term (current) drug therapy: Secondary | ICD-10-CM | POA: Diagnosis not present

## 2015-06-01 LAB — CBC WITH DIFFERENTIAL/PLATELET
BASOS PCT: 0 % (ref 0–1)
Basophils Absolute: 0 10*3/uL (ref 0.0–0.1)
Basophils Absolute: 0 10*3/uL (ref 0.0–0.1)
Basophils Relative: 0 % (ref 0–1)
EOS ABS: 0 10*3/uL (ref 0.0–0.7)
EOS PCT: 1 % (ref 0–5)
Eosinophils Absolute: 0 10*3/uL (ref 0.0–0.7)
Eosinophils Relative: 0 % (ref 0–5)
HCT: 38.8 % — ABNORMAL LOW (ref 39.0–52.0)
HEMATOCRIT: 43.1 % (ref 39.0–52.0)
HEMOGLOBIN: 12.6 g/dL — AB (ref 13.0–17.0)
Hemoglobin: 14 g/dL (ref 13.0–17.0)
Lymphocytes Relative: 14 % (ref 12–46)
Lymphocytes Relative: 27 % (ref 12–46)
Lymphs Abs: 0.9 10*3/uL (ref 0.7–4.0)
Lymphs Abs: 1.5 10*3/uL (ref 0.7–4.0)
MCH: 29 pg (ref 26.0–34.0)
MCH: 29 pg (ref 26.0–34.0)
MCHC: 32.5 g/dL (ref 30.0–36.0)
MCHC: 32.5 g/dL (ref 30.0–36.0)
MCV: 89.2 fL (ref 78.0–100.0)
MCV: 89.2 fL (ref 78.0–100.0)
Monocytes Absolute: 0.4 10*3/uL (ref 0.1–1.0)
Monocytes Absolute: 0.5 10*3/uL (ref 0.1–1.0)
Monocytes Relative: 7 % (ref 3–12)
Monocytes Relative: 9 % (ref 3–12)
NEUTROS ABS: 3.3 10*3/uL (ref 1.7–7.7)
Neutro Abs: 4.9 10*3/uL (ref 1.7–7.7)
Neutrophils Relative %: 63 % (ref 43–77)
Neutrophils Relative %: 78 % — ABNORMAL HIGH (ref 43–77)
PLATELETS: 166 10*3/uL (ref 150–400)
Platelets: 132 10*3/uL — ABNORMAL LOW (ref 150–400)
RBC: 4.35 MIL/uL (ref 4.22–5.81)
RBC: 4.83 MIL/uL (ref 4.22–5.81)
RDW: 15 % (ref 11.5–15.5)
RDW: 15.1 % (ref 11.5–15.5)
WBC: 5.3 10*3/uL (ref 4.0–10.5)
WBC: 6.2 10*3/uL (ref 4.0–10.5)

## 2015-06-01 LAB — URINALYSIS, ROUTINE W REFLEX MICROSCOPIC
Bilirubin Urine: NEGATIVE
Glucose, UA: NEGATIVE mg/dL
KETONES UR: NEGATIVE mg/dL
NITRITE: NEGATIVE
PH: 6 (ref 5.0–8.0)
PROTEIN: 100 mg/dL — AB
SPECIFIC GRAVITY, URINE: 1.017 (ref 1.005–1.030)
Urobilinogen, UA: 0.2 mg/dL (ref 0.0–1.0)

## 2015-06-01 LAB — COMPREHENSIVE METABOLIC PANEL
ALK PHOS: 60 U/L (ref 38–126)
ALT: 23 U/L (ref 17–63)
AST: 26 U/L (ref 15–41)
Albumin: 3.4 g/dL — ABNORMAL LOW (ref 3.5–5.0)
Anion gap: 7 (ref 5–15)
BUN: 27 mg/dL — ABNORMAL HIGH (ref 6–20)
CO2: 26 mmol/L (ref 22–32)
CREATININE: 0.88 mg/dL (ref 0.61–1.24)
Calcium: 9.1 mg/dL (ref 8.9–10.3)
Chloride: 100 mmol/L — ABNORMAL LOW (ref 101–111)
GFR calc Af Amer: >60 mL/min (ref >60)
GFR calc non Af Amer: >60 mL/min (ref >60)
Glucose, Bld: 90 mg/dL (ref 65–99)
Potassium: 4.1 mmol/L (ref 3.5–5.1)
Sodium: 133 mmol/L — ABNORMAL LOW (ref 135–145)
Total Bilirubin: 0.5 mg/dL (ref 0.3–1.2)
Total Protein: 7.2 g/dL (ref 6.5–8.1)

## 2015-06-01 LAB — URINE MICROSCOPIC-ADD ON

## 2015-06-01 LAB — I-STAT CHEM 8, ED
BUN: 35 mg/dL — AB (ref 6–20)
CALCIUM ION: 1.02 mmol/L — AB (ref 1.13–1.30)
CHLORIDE: 101 mmol/L (ref 101–111)
Creatinine, Ser: 1.1 mg/dL (ref 0.61–1.24)
Glucose, Bld: 103 mg/dL — ABNORMAL HIGH (ref 65–99)
HCT: 50 % (ref 39.0–52.0)
HEMOGLOBIN: 17 g/dL (ref 13.0–17.0)
Potassium: 4.2 mmol/L (ref 3.5–5.1)
Sodium: 132 mmol/L — ABNORMAL LOW (ref 135–145)
TCO2: 21 mmol/L (ref 0–100)

## 2015-06-01 LAB — I-STAT TROPONIN, ED: Troponin i, poc: 0.06 ng/mL (ref 0.00–0.08)

## 2015-06-01 MED ORDER — CEFTRIAXONE SODIUM IN DEXTROSE 20 MG/ML IV SOLN
1.0000 g | INTRAVENOUS | Status: DC
  Administered 2015-06-02 – 2015-06-04 (×3): 1 g via INTRAVENOUS
  Filled 2015-06-01 (×3): qty 50

## 2015-06-01 MED ORDER — GUAIFENESIN ER 600 MG PO TB12: 600.0000 mg | ORAL_TABLET | Freq: Two times a day (BID) | ORAL | Status: DC | PRN

## 2015-06-01 MED ORDER — CALCIUM CARBONATE-VITAMIN D 500-200 MG-UNIT PO TABS
1.0000 | ORAL_TABLET | Freq: Two times a day (BID) | ORAL | Status: DC
  Administered 2015-06-01 – 2015-06-03 (×6): 1 via ORAL
  Filled 2015-06-01 (×9): qty 1

## 2015-06-01 MED ORDER — LIDOCAINE 5 % EX PTCH
1.0000 | MEDICATED_PATCH | CUTANEOUS | Status: DC
  Administered 2015-06-01 – 2015-06-02 (×2): 1 via TRANSDERMAL
  Filled 2015-06-01 (×4): qty 1

## 2015-06-01 MED ORDER — CHLORHEXIDINE GLUCONATE 0.12 % MT SOLN
15.0000 mL | Freq: Two times a day (BID) | OROMUCOSAL | Status: DC
  Administered 2015-06-01 – 2015-06-02 (×2): 15 mL via OROMUCOSAL
  Filled 2015-06-01 (×8): qty 15

## 2015-06-01 MED ORDER — DEXTROSE 5 % IV SOLN
1.0000 g | Freq: Once | INTRAVENOUS | Status: AC
  Administered 2015-06-01: 1 g via INTRAVENOUS
  Filled 2015-06-01: qty 10

## 2015-06-01 MED ORDER — ACETAMINOPHEN 325 MG PO TABS: 650.0000 mg | ORAL_TABLET | Freq: Four times a day (QID) | ORAL | Status: DC | PRN

## 2015-06-01 MED ORDER — VITAMIN B-12 1000 MCG PO TABS
5000.0000 ug | ORAL_TABLET | Freq: Two times a day (BID) | ORAL | Status: DC
  Administered 2015-06-01 – 2015-06-03 (×5): 5000 ug via ORAL
  Filled 2015-06-01 (×8): qty 5

## 2015-06-01 MED ORDER — MEMANTINE HCL 10 MG PO TABS
10.0000 mg | ORAL_TABLET | Freq: Two times a day (BID) | ORAL | Status: DC
  Administered 2015-06-01 – 2015-06-03 (×6): 10 mg via ORAL
  Filled 2015-06-01 (×8): qty 1

## 2015-06-01 MED ORDER — CALTRATE 600+D PLUS MINERALS 600-800 MG-UNIT PO CHEW: 1.0000 | CHEWABLE_TABLET | Freq: Two times a day (BID) | ORAL | Status: DC

## 2015-06-01 MED ORDER — FERROUS SULFATE 325 (65 FE) MG PO TABS
325.0000 mg | ORAL_TABLET | ORAL | Status: DC
  Administered 2015-06-02: 325 mg via ORAL
  Filled 2015-06-01 (×2): qty 1

## 2015-06-01 MED ORDER — HYDRALAZINE HCL 25 MG PO TABS
12.5000 mg | ORAL_TABLET | Freq: Two times a day (BID) | ORAL | Status: DC
  Administered 2015-06-01 – 2015-06-03 (×5): 12.5 mg via ORAL
  Filled 2015-06-01 (×8): qty 0.5

## 2015-06-01 MED ORDER — ONDANSETRON HCL 4 MG/2ML IJ SOLN: 4.0000 mg | Freq: Four times a day (QID) | INTRAMUSCULAR | Status: DC | PRN

## 2015-06-01 MED ORDER — DICLOFENAC SODIUM 1 % TD GEL
2.0000 g | Freq: Four times a day (QID) | TRANSDERMAL | Status: DC | PRN
  Filled 2015-06-01: qty 100

## 2015-06-01 MED ORDER — RIVAROXABAN 20 MG PO TABS
20.0000 mg | ORAL_TABLET | Freq: Every day | ORAL | Status: DC
  Administered 2015-06-01: 20 mg via ORAL
  Filled 2015-06-01 (×3): qty 1

## 2015-06-01 MED ORDER — ACETAMINOPHEN 650 MG RE SUPP: 650.0000 mg | Freq: Four times a day (QID) | RECTAL | Status: DC | PRN

## 2015-06-01 MED ORDER — ONDANSETRON HCL 4 MG PO TABS: 4.0000 mg | ORAL_TABLET | Freq: Four times a day (QID) | ORAL | Status: DC | PRN

## 2015-06-01 MED ORDER — BOOST PO LIQD
90.0000 mL | Freq: Two times a day (BID) | ORAL | Status: DC
  Administered 2015-06-01 – 2015-06-03 (×6): 90 mL via ORAL
  Filled 2015-06-01 (×7): qty 237

## 2015-06-01 MED ORDER — PANTOPRAZOLE SODIUM 40 MG PO TBEC
40.0000 mg | DELAYED_RELEASE_TABLET | Freq: Every day | ORAL | Status: DC
  Administered 2015-06-01 – 2015-06-03 (×3): 40 mg via ORAL
  Filled 2015-06-01 (×4): qty 1

## 2015-06-01 MED ORDER — PSYLLIUM 95 % PO PACK
1.0000 | PACK | Freq: Two times a day (BID) | ORAL | Status: DC
  Administered 2015-06-01 – 2015-06-03 (×6): 1 via ORAL
  Filled 2015-06-01 (×8): qty 1

## 2015-06-01 MED ORDER — SODIUM CHLORIDE 0.9 % IV SOLN
INTRAVENOUS | Status: AC
  Administered 2015-06-01: 05:00:00 via INTRAVENOUS

## 2015-06-01 MED ORDER — VITAMIN B-12 5000 MCG SL SUBL: 5000.0000 ug | SUBLINGUAL_TABLET | Freq: Two times a day (BID) | SUBLINGUAL | Status: DC

## 2015-06-01 MED ORDER — HYDRALAZINE HCL 20 MG/ML IJ SOLN: 10.0000 mg | INTRAMUSCULAR | Status: DC | PRN

## 2015-06-01 MED ORDER — CETYLPYRIDINIUM CHLORIDE 0.05 % MT LIQD
7.0000 mL | Freq: Two times a day (BID) | OROMUCOSAL | Status: DC
Start: 2015-06-01 — End: 2015-06-04
  Administered 2015-06-01 – 2015-06-02 (×3): 7 mL via OROMUCOSAL

## 2015-06-01 MED ORDER — LEVOTHYROXINE SODIUM 25 MCG PO TABS
25.0000 ug | ORAL_TABLET | Freq: Every day | ORAL | Status: DC
  Administered 2015-06-01 – 2015-06-03 (×3): 25 ug via ORAL
  Filled 2015-06-01 (×5): qty 1

## 2015-06-01 MED ORDER — POLYETHYLENE GLYCOL 3350 17 G PO PACK: 17.0000 g | PACK | Freq: Every day | ORAL | Status: DC | PRN

## 2015-06-01 NOTE — ED Provider Notes (Signed)
CSN: [id number]     Arrival date & time 05/31/15  2122 History   First MD Initiated Contact with Patient 05/31/15 2303     Chief Complaint  Patient presents with  . Altered Mental Status     (Consider location/radiation/quality/duration/timing/severity/associated sxs/prior Treatment) Patient is a 81 y.o. male presenting with altered mental status. The history is provided by the EMS personnel. The history is limited by the condition of the patient (level 5 caveat).  Altered Mental Status Presenting symptoms: behavior changes   Severity:  Moderate Most recent episode:  Today Timing:  Constant Progression:  Unchanged Chronicity:  New Context: dementia   Context: not alcohol use   Associated symptoms: no abdominal pain     Past Medical History  Diagnosis Date  . IBS (irritable bowel syndrome)   . Diverticulosis   . Cataract   . OAB (overactive bladder)   . Macular degeneration   . Hypertension   . Parkinsonian syndrome   . Syncope, vasovagal   . 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 Unspecified transient cerebral ischemia   . Unspecified urinary incontinence   . Hypothyroidism   . Anemia, unspecified   . Depression   . Alzheimer's disease   . Gout, unspecified   . Thoracic or lumbosacral neuritis or radiculitis, unspecified   . Progressive supranuclear palsy   . Paralysis agitans   . Pneumonitis due to inhalation of food or vomitus   . Cellulitis and abscess of oral soft tissues   . Unspecified hypothyroidism   . Pneumonia, organism unspecified   . Pseudomonas infection in conditions classified elsewhere and of unspecified site   . Urinary tract infection, site not specified   . Osteoarthrosis, unspecified whether generalized or localized, unspecified site   . Osteoporosis, unspecified   . Gout, unspecified   . Thoracic or lumbosacral neuritis or radiculitis, unspecified   . Stroke    Past Surgical History  Procedure Laterality  Date  . Esophagogastroduodenoscopy    . Eye surgery  2006    LEFT CATARACT  . Esophagogastroduodenoscopy N/A 04/24/2015    Procedure: ESOPHAGOGASTRODUODENOSCOPY (EGD);  Surgeon: Carol Ada, MD;  Location: [facility] ENDOSCOPY;  Service: Endoscopy;  Laterality: N/A;   Family History  Problem Relation Age of Onset  . Cancer Mother   . Stroke Father   . Liver disease Father   . Kidney disease Brother   . Liver disease Brother   . Emphysema Brother    History  Substance Use Topics  . Smoking status: Former Research scientist (life sciences)  . Smokeless tobacco: Never Used     Comment: QUIT SMOKING BACK IN THE LATE 50'S  . Alcohol Use: No    Review of Systems  Unable to perform ROS Gastrointestinal: Negative for abdominal pain.      Allergies  Other  Home Medications   Prior to Admission medications   Medication Sig Start Date End Date Taking? Authorizing Provider  acetaminophen (TYLENOL) 500 MG tablet Take 1,000 mg by mouth 3 (three) times daily.    Yes Historical Provider, MD  AMBULATORY NON FORMULARY MEDICATION Pressure Air Mattress Dx: G60.8, L89.150 10/30/14  Yes Tiffany L Reed, DO  bacitracin ointment Apply 1 application topically 2 (two) times daily. Applies to head of penis to decrease infection from catheter.   Yes Historical Provider, MD  Calcium Carbonate-Vit D-Min (CALTRATE 600+D PLUS) 600-800 MG-UNIT CHEW Chew 1 tablet by mouth 2 (two) times daily with a meal.    Yes Historical Provider, MD  CRANBERRY SOFT PO Take 2 capsules by mouth 3 (three) times daily.    Yes Historical Provider, MD  Cyanocobalamin (VITAMIN B-12) 5000 MCG SUBL Place 5,000 mcg under the tongue 2 (two) times daily.   Yes Historical Provider, MD  ferrous sulfate 325 (65 FE) MG tablet Take 325 mg by mouth every other day.    Yes Historical Provider, MD  guaiFENesin (MUCINEX) 600 MG 12 hr tablet Take 600 mg by mouth 2 (two) times daily as needed for cough.   Yes Historical Provider, MD  hydrALAZINE (APRESOLINE) 25 MG tablet Take  1/2 tablet by mouth twice daily to control blood pressure Patient taking differently: Take 12.5 mg by mouth 2 (two) times daily. Take 1/2 tablet by mouth twice daily to control blood pressure 11/14/14  Yes Mahima Pandey, MD  lactose free nutrition (BOOST) LIQD Take 90 mLs by mouth 3 (three) times daily between meals. Patient taking differently: Take 90 mLs by mouth 2 (two) times daily between meals.  07/01/14  Yes Barton Dubois, MD  levothyroxine (SYNTHROID, LEVOTHROID) 25 MCG tablet TAKE 1 TABLET BY MOUTH DAILY Patient taking differently: TAKE 25MCG BY MOUTH DAILY 05/14/15  Yes Tiffany L Reed, DO  lidocaine (LIDODERM) 5 % PLACE 3 PATCHES ONTO THE SKIN DAILY. REMOVE & DISCARD PATCH WITHIN 12 HOURS OR AS DIRECTED BY MD Patient taking differently: PLACE 1-3 PATCHES ONTO THE SKIN DAILY AS NEEDED FOR PAIN. REMOVE & DISCARD WITHIN 12 HOURS 02/16/15  Yes Tiffany L Reed, DO  memantine (NAMENDA) 10 MG tablet Take 10 mg by mouth 2 (two) times daily.   Yes Historical Provider, MD  Multiple Vitamins-Minerals (ICAPS AREDS FORMULA PO) Take 2 capsules by mouth 2 (two) times daily with a meal.    Yes Historical Provider, MD  NON FORMULARY D-Mannose, Take 2 teaspoon by mouth with fluids 2 times a day   Yes Historical Provider, MD  pantoprazole (PROTONIX) 40 MG tablet Take 1 tablet (40 mg total) by mouth daily. 04/25/15  Yes Charlynne Cousins, MD  polyethylene glycol [facility] Floria Raveling) packet Take 17 g by mouth daily as needed for mild constipation. 07/01/14  Yes Barton Dubois, MD  Probiotic Product (Imperial) CAPS Take 1 capsule by mouth 2 (two) times daily.   Yes Historical Provider, MD  psyllium (METAMUCIL) 58.6 % packet Take 1 packet by mouth 2 (two) times daily.    Yes Historical Provider, MD  rivaroxaban (XARELTO) 20 MG TABS tablet Take 1 tablet (20 mg total) by mouth daily with supper. 05/10/15  Yes Tiffany L Reed, DO  VOLTAREN 1 % GEL Apply 2 g topically 4 (four) times daily as needed (pain).   07/10/14  Yes Historical Provider, MD   BP 123/77 mmHg  Pulse 63  Temp(Src) 97.3 F (36.3 C) (Oral)  Resp 16  SpO2 97% Physical Exam  Constitutional: He appears well-developed and well-nourished. No distress.  HENT:  Head: Normocephalic and atraumatic.  Mouth/Throat: Oropharynx is clear and moist.  Eyes: EOM are normal. Pupils are equal, round, and reactive to light.  Neck: Normal range of motion. Neck supple.  Cardiovascular: Normal rate, regular rhythm and intact distal pulses.   Pulmonary/Chest: Effort normal and breath sounds normal. No respiratory distress. He has no wheezes. He has no rales.  Abdominal: Soft. Bowel sounds are normal. There is no tenderness. There is no rebound and no guarding.  Musculoskeletal: Normal range of motion.  Neurological: He is alert. No cranial nerve deficit.  Skin: Skin is warm and dry.  Psychiatric: He has a normal mood and affect.    ED Course  Procedures (including critical care time) Labs Review Labs Reviewed  COMPREHENSIVE METABOLIC PANEL - Abnormal; Notable for the following:    Sodium 131 (*)    Chloride 99 (*)    Glucose, Bld 121 (*)    BUN 27 (*)    GFR calc non Af Amer 59 (*)    All other components within normal limits  CBC WITH DIFFERENTIAL/PLATELET - Abnormal; Notable for the following:    Platelets 132 (*)    Neutrophils Relative % 78 (*)    All other components within normal limits  URINALYSIS, ROUTINE W REFLEX MICROSCOPIC (NOT AT [facility]) - Abnormal; Notable for the following:    Color, Urine AMBER (*)    APPearance CLOUDY (*)    Hgb urine dipstick SMALL (*)    Protein, ur 100 (*)    Leukocytes, UA LARGE (*)    All other components within normal limits  URINE MICROSCOPIC-ADD ON - Abnormal; Notable for the following:    Bacteria, UA MANY (*)    Casts HYALINE CASTS (*)    All other components within normal limits  CBG MONITORING, ED - Abnormal; Notable for the following:    Glucose-Capillary 132 (*)    All other  components within normal limits  I-STAT CHEM 8, ED - Abnormal; Notable for the following:    Sodium 132 (*)    BUN 35 (*)    Glucose, Bld 103 (*)    Calcium, Ion 1.02 (*)    All other components within normal limits  Randolm Idol, ED    Imaging Review Dg Chest 2 View  05/31/2015   CLINICAL DATA:  81 year old male with possible aspiration.  EXAM: CHEST  2 VIEW  COMPARISON:  Radiograph 04/22/2015  FINDINGS: Evaluation is limited due to patient's kyphotic position as well as projection of the arms over the chest on the lateral view. No definite consolidation. No significant pleural effusion. No pneumothorax. Stable cardiomegaly. Osteopenia with degenerative changes of the spine.  IMPRESSION: No focal consolidation.   Electronically Signed   By: Anner Crete M.D.   On: 05/31/2015 23:11   Ct Head Wo Contrast  06/01/2015   CLINICAL DATA:  Altered mental status. Episode of unresponsiveness. This began about 1-1/2 hours ago. History of neurological disorder and nonverbal since gene.  EXAM: CT HEAD WITHOUT CONTRAST  TECHNIQUE: Contiguous axial images were obtained from the base of the skull through the vertex without intravenous contrast.  COMPARISON:  03/31/2015  FINDINGS: Examination is limited due to nonstandard position required because of the patient's severe kyphosis. Study is scanned in a predominantly coronal plane. Streak artifacts other fairly limits visualization.  There is evidence of diffuse cerebral atrophy with ventricular dilatation likely due to central atrophy. Low-attenuation changes in the white matter is probably due to small vessel ischemia. Low-attenuation changes appear to extend to the cortical surface in the right parietal region, likely representing old infarct. There is no evidence of significant mass effect or midline shift. No abnormal extra-axial fluid collections. No evidence of acute intracranial hemorrhage. Calvarium appears intact. Retention cysts in the sphenoid  sinuses. Mastoid air cells are not opacified.  IMPRESSION: Technically limited study. Diffuse atrophy and small vessel ischemic changes. Probable old infarct in the right parietal region. No acute intracranial hemorrhage or midline shift.   Electronically Signed   By: Lucienne Capers M.D.   On: 06/01/2015 00:14   Dg Foot Complete Left  05/31/2015   CLINICAL DATA:  81 year old male with fall sore to the heel of foot  EXAM: LEFT FOOT - COMPLETE 3+ VIEW  COMPARISON:  None.  FINDINGS: No acute fracture or dislocation. There is diffuse osteopenia. The soft tissues are grossly unremarkable. No radiopaque foreign object identified.  IMPRESSION: Osteopenia. No acute fracture or dislocation. No radiopaque foreign object.   Electronically Signed   By: Anner Crete M.D.   On: 05/31/2015 23:57     EKG Interpretation None      MDM   Final diagnoses:  None    Results for orders placed or performed during the hospital encounter of 05/31/15  Comprehensive metabolic panel  Result Value Ref Range   Sodium 131 (L) 135 - 145 mmol/L   Potassium 4.4 3.5 - 5.1 mmol/L   Chloride 99 (L) 101 - 111 mmol/L   CO2 22 22 - 32 mmol/L   Glucose, Bld 121 (H) 65 - 99 mg/dL   BUN 27 (H) 6 - 20 mg/dL   Creatinine, Ser 1.04 0.61 - 1.24 mg/dL   Calcium 9.3 8.9 - 10.3 mg/dL   Total Protein 7.4 6.5 - 8.1 g/dL   Albumin 3.5 3.5 - 5.0 g/dL   AST 31 15 - 41 U/L   ALT 22 17 - 63 U/L   Alkaline Phosphatase 64 38 - 126 U/L   Total Bilirubin 0.5 0.3 - 1.2 mg/dL   GFR calc non Af Amer 59 (L) >60 mL/min   GFR calc Af Amer >60 >60 mL/min   Anion gap 10 5 - 15  CBC with Differential/Platelet  Result Value Ref Range   WBC 6.2 4.0 - 10.5 K/uL   RBC 4.83 4.22 - 5.81 MIL/uL   Hemoglobin 14.0 13.0 - 17.0 g/dL   HCT 43.1 39.0 - 52.0 %   MCV 89.2 78.0 - 100.0 fL   MCH 29.0 26.0 - 34.0 pg   MCHC 32.5 30.0 - 36.0 g/dL   RDW 15.1 11.5 - 15.5 %   Platelets 132 (L) 150 - 400 K/uL   Neutrophils Relative % 78 (H) 43 - 77 %    Neutro Abs 4.9 1.7 - 7.7 K/uL   Lymphocytes Relative 14 12 - 46 %   Lymphs Abs 0.9 0.7 - 4.0 K/uL   Monocytes Relative 7 3 - 12 %   Monocytes Absolute 0.4 0.1 - 1.0 K/uL   Eosinophils Relative 1 0 - 5 %   Eosinophils Absolute 0.0 0.0 - 0.7 K/uL   Basophils Relative 0 0 - 1 %   Basophils Absolute 0.0 0.0 - 0.1 K/uL  Urinalysis, Routine w reflex microscopic (not at [facility])  Result Value Ref Range   Color, Urine AMBER (A) YELLOW   APPearance CLOUDY (A) CLEAR   Specific Gravity, Urine 1.017 1.005 - 1.030   pH 6.0 5.0 - 8.0   Glucose, UA NEGATIVE NEGATIVE mg/dL   Hgb urine dipstick SMALL (A) NEGATIVE   Bilirubin Urine NEGATIVE NEGATIVE   Ketones, ur NEGATIVE NEGATIVE mg/dL   Protein, ur 100 (A) NEGATIVE mg/dL   Urobilinogen, UA 0.2 0.0 - 1.0 mg/dL   Nitrite NEGATIVE NEGATIVE   Leukocytes, UA LARGE (A) NEGATIVE  Urine microscopic-add on  Result Value Ref Range   WBC, UA 21-50 <3 WBC/hpf   RBC / HPF 3-6 <3 RBC/hpf   Bacteria, UA MANY (A) RARE   Casts HYALINE CASTS (A) NEGATIVE  CBG monitoring, ED  Result Value Ref Range   Glucose-Capillary 132 (H) 65 - 99  mg/dL  I-Stat Chem 8, ED  Result Value Ref Range   Sodium 132 (L) 135 - 145 mmol/L   Potassium 4.2 3.5 - 5.1 mmol/L   Chloride 101 101 - 111 mmol/L   BUN 35 (H) 6 - 20 mg/dL   Creatinine, Ser 1.10 0.61 - 1.24 mg/dL   Glucose, Bld 103 (H) 65 - 99 mg/dL   Calcium, Ion 1.02 (L) 1.13 - 1.30 mmol/L   TCO2 21 0 - 100 mmol/L   Hemoglobin 17.0 13.0 - 17.0 g/dL   HCT 50.0 39.0 - 52.0 %  I-stat troponin, ED  Result Value Ref Range   Troponin i, poc 0.06 0.00 - 0.08 ng/mL   Comment 3           Dg Chest 2 View  05/31/2015   CLINICAL DATA:  81 year old male with possible aspiration.  EXAM: CHEST  2 VIEW  COMPARISON:  Radiograph 04/22/2015  FINDINGS: Evaluation is limited due to patient's kyphotic position as well as projection of the arms over the chest on the lateral view. No definite consolidation. No significant pleural effusion. No  pneumothorax. Stable cardiomegaly. Osteopenia with degenerative changes of the spine.  IMPRESSION: No focal consolidation.   Electronically Signed   By: Anner Crete M.D.   On: 05/31/2015 23:11   Ct Head Wo Contrast  06/01/2015   CLINICAL DATA:  Altered mental status. Episode of unresponsiveness. This began about 1-1/2 hours ago. History of neurological disorder and nonverbal since gene.  EXAM: CT HEAD WITHOUT CONTRAST  TECHNIQUE: Contiguous axial images were obtained from the base of the skull through the vertex without intravenous contrast.  COMPARISON:  03/31/2015  FINDINGS: Examination is limited due to nonstandard position required because of the patient's severe kyphosis. Study is scanned in a predominantly coronal plane. Streak artifacts other fairly limits visualization.  There is evidence of diffuse cerebral atrophy with ventricular dilatation likely due to central atrophy. Low-attenuation changes in the white matter is probably due to small vessel ischemia. Low-attenuation changes appear to extend to the cortical surface in the right parietal region, likely representing old infarct. There is no evidence of significant mass effect or midline shift. No abnormal extra-axial fluid collections. No evidence of acute intracranial hemorrhage. Calvarium appears intact. Retention cysts in the sphenoid sinuses. Mastoid air cells are not opacified.  IMPRESSION: Technically limited study. Diffuse atrophy and small vessel ischemic changes. Probable old infarct in the right parietal region. No acute intracranial hemorrhage or midline shift.   Electronically Signed   By: Lucienne Capers M.D.   On: 06/01/2015 00:14   Dg Foot Complete Left  05/31/2015   CLINICAL DATA:  81 year old male with fall sore to the heel of foot  EXAM: LEFT FOOT - COMPLETE 3+ VIEW  COMPARISON:  None.  FINDINGS: No acute fracture or dislocation. There is diffuse osteopenia. The soft tissues are grossly unremarkable. No radiopaque foreign  object identified.  IMPRESSION: Osteopenia. No acute fracture or dislocation. No radiopaque foreign object.   Electronically Signed   By: Anner Crete M.D.   On: 05/31/2015 23:57       Shawanna Zanders, MD 06/01/15 [phone]

## 2015-06-01 NOTE — Progress Notes (Signed)
Patient seen and examined this morning. Appeared more awake and alert during the afternoon.  Acute encephalopathy  with underlying progressive supranuclear past he likely due to UTI. Continue IV Rocephin.  Dysphagia: Seen by swallow eval and recommend pured diet.  Hypertension On when necessary IV hydralazine  Left upper extremity DVT Limited study due to patient she really contracted but previous DVT seemed to have resolved. Will discontinue Xarelto.  Goals of care Patient known to palliative care service from his past hospitalization and is currently DO NOT RESUSCITATE. Palliative care evaluated patient today and given progressive decline and severe deconditioning recommended home with home hospice.

## 2015-06-01 NOTE — Plan of Care (Signed)
Problem: Phase I Progression Outcomes Goal: Voiding-avoid urinary catheter unless indicated Outcome: Not Met (add Reason) Chronic foley cath

## 2015-06-01 NOTE — Progress Notes (Signed)
ANTIBIOTIC CONSULT NOTE - INITIAL  Pharmacy Consult for Ceftriaxone Indication: UTI  Allergies  Allergen Reactions  . Other Other (See Comments)    Dairy products- cause swallowing difficulty     Patient Measurements: Weight: 118 lb 6.2 oz (53.7 kg)  Vital Signs: Temp: 97.7 F (36.5 C) (07/15 0300) Temp Source: Oral (07/15 0300) BP: 136/71 mmHg (07/15 0300) Pulse Rate: 83 (07/15 0300) Intake/Output from previous day:   Intake/Output from this shift:    Labs:  Recent Labs  05/31/15 2243 06/01/15 0009 06/01/15 0020  WBC  --  6.2  --   HGB  --  14.0 17.0  PLT  --  132*  --   CREATININE 1.04  --  1.10   Estimated Creatinine Clearance: 30.5 mL/min (by C-G formula based on Cr of 1.1). No results for input(s): VANCOTROUGH, VANCOPEAK, VANCORANDOM, GENTTROUGH, GENTPEAK, GENTRANDOM, TOBRATROUGH, TOBRAPEAK, TOBRARND, AMIKACINPEAK, AMIKACINTROU, AMIKACIN in the last 72 hours.   Microbiology: No results found for this or any previous visit (from the past 720 hour(s)).  Medical History: Past Medical History  Diagnosis Date  . IBS (irritable bowel syndrome)   . Diverticulosis   . Cataract   . OAB (overactive bladder)   . Macular degeneration   . Hypertension   . Parkinsonian syndrome   . Syncope, vasovagal   . 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 Unspecified transient cerebral ischemia   . Unspecified urinary incontinence   . Hypothyroidism   . Anemia, unspecified   . Depression   . Alzheimer's disease   . Gout, unspecified   . Thoracic or lumbosacral neuritis or radiculitis, unspecified   . Progressive supranuclear palsy   . Paralysis agitans   . Pneumonitis due to inhalation of food or vomitus   . Cellulitis and abscess of oral soft tissues   . Unspecified hypothyroidism   . Pneumonia, organism unspecified   . Pseudomonas infection in conditions classified elsewhere and of unspecified site   . Urinary tract infection, site  not specified   . Osteoarthrosis, unspecified whether generalized or localized, unspecified site   . Osteoporosis, unspecified   . Gout, unspecified   . Thoracic or lumbosacral neuritis or radiculitis, unspecified   . Stroke     Medications:  Scheduled:  . calcium-vitamin D  1 tablet Oral BID WC  . [START ON 06/02/2015] cefTRIAXone (ROCEPHIN)  IV  1 g Intravenous Q24H  . [START ON 06/02/2015] ferrous sulfate  325 mg Oral QODAY  . hydrALAZINE  12.5 mg Oral BID  . lactose free nutrition  90 mL Oral BID BM  . levothyroxine  25 mcg Oral QAC breakfast  . lidocaine  1 patch Transdermal Q24H  . memantine  10 mg Oral BID  . pantoprazole  40 mg Oral Daily  . psyllium  1 packet Oral BID  . rivaroxaban  20 mg Oral Q breakfast  . vitamin B-12  5,000 mcg Oral BID   Infusions:  . sodium chloride     Assessment:  81 yr male presents with altered mental status  Pharmacy requested to dose Ceftriaxone for treatment of UTI  Goal of Therapy:  Eradication of infection  Plan:  Ceftriaxone 1gm IV q24h  Lundyn Coste, Toribio Harbour, PharmD 06/01/2015,3:52 AM

## 2015-06-01 NOTE — Evaluation (Signed)
Clinical/Bedside Swallow Evaluation Patient Details  Name: Ricky [name] MRN: [id number] Date of Birth: [birth year]  Today's Date: 06/01/2015 Time: SLP Start Time (ACUTE ONLY): 39 SLP Stop Time (ACUTE ONLY): 1325 SLP Time Calculation (min) (ACUTE ONLY): 19 min  Past Medical History:  Past Medical History  Diagnosis Date  . IBS (irritable bowel syndrome)   . Diverticulosis   . Cataract   . OAB (overactive bladder)   . Macular degeneration   . Hypertension   . Parkinsonian syndrome   . Syncope, vasovagal   . 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 Unspecified transient cerebral ischemia   . Unspecified urinary incontinence   . Hypothyroidism   . Anemia, unspecified   . Depression   . Alzheimer's disease   . Gout, unspecified   . Thoracic or lumbosacral neuritis or radiculitis, unspecified   . Progressive supranuclear palsy   . Paralysis agitans   . Pneumonitis due to inhalation of food or vomitus   . Cellulitis and abscess of oral soft tissues   . Unspecified hypothyroidism   . Pneumonia, organism unspecified   . Pseudomonas infection in conditions classified elsewhere and of unspecified site   . Urinary tract infection, site not specified   . Osteoarthrosis, unspecified whether generalized or localized, unspecified site   . Osteoporosis, unspecified   . Gout, unspecified   . Thoracic or lumbosacral neuritis or radiculitis, unspecified   . Stroke    Past Surgical History:  Past Surgical History  Procedure Laterality Date  . Esophagogastroduodenoscopy    . Eye surgery  2006    LEFT CATARACT  . Esophagogastroduodenoscopy N/A 04/24/2015    Procedure: ESOPHAGOGASTRODUODENOSCOPY (EGD);  Surgeon: Carol Ada, MD;  Location: [facility] ENDOSCOPY;  Service: Endoscopy;  Laterality: N/A;   HPI:  81 yo male with h/o PSP, pneumonitis, cognitive deficits, oropharyngeal dysphagia, UTIs, wounds admitted with AMS.  Per MD note, family indicated pt with  progressive dysphagia requiring liquid diet at home.  Pt has been seen by SLP and underwent MBS 03/2012 showing backflow from UES due to CP bar which cued dry swallows helped to clear.  Pt did not aspirate during MBS 2013.  Per review of chart, pt had a MOST form signed during a prior admission with indication of no feeding tube desired.  Swallow evaluaiton ordered.     Assessment / Plan / Recommendation Clinical Impression  Pt presents with moderately severe oral more than pharyngeal phase of dysphagia.  Dysphagia characterized by severe oral holding *up to 24 seconds with puree and 18 seconds with liquids.  Pt will elicit swallow with SLP placing straw/spoon to labial region prior to opening to accept more intake.  This compensation strategy is helpful when he is orally holding. No s/s of aspiration noted.  Pt is at chronic aspiration risk due to his cognitive status, PSP vs Parkinsonian dx, kyphosis and dysphagia.  Will follow up x1 for family education to mitigate risk.  Suspect intake of liquids will be more efficient for pt.  Do not recommend solids due to level of deficits.     Aspiration Risk  Moderate    Diet Recommendation Thin;Dysphagia 1 (Puree)   Medication Administration: Crushed with puree Compensations: Slow rate;Check for pocketing    Other  Recommendations Oral Care Recommendations: Oral care before and after PO Other Recommendations: Have oral suction available   Follow Up Recommendations       Frequency and Duration min 1 x/week  1 week   Pertinent  Vitals/Pain Afebrile, decreased      Swallow Study Prior Functional Status   see hhx    General Date of Onset: 06/01/15 Other Pertinent Information: 81 yo male with h/o PSP, pneumonitis, cognitive deficits, oropharyngeal dysphagia, UTIs, wounds admitted with AMS.  Per MD note, family indicated pt with progressive dysphagia requiring liquid diet at home.  Pt has been seen by SLP and underwent MBS 03/2012 showing backflow  from UES due to CP bar which cued dry swallows helped to clear.  Pt did not aspirate during MBS 2013.  Per review of chart, pt had a MOST form signed during a prior admission with indication of no feeding tube desired.  Swallow evaluaiton ordered.   Type of Study: Bedside swallow evaluation Diet Prior to this Study: Regular;Thin liquids Temperature Spikes Noted: No Respiratory Status: Supplemental O2 delivered via (comment) History of Recent Intubation: No Behavior/Cognition: Alert;Distractible;Requires cueing;Doesn't follow directions (pt has cognitive deficits) Oral Cavity - Dentition: Adequate natural dentition/normal for age Self-Feeding Abilities: Total assist Patient Positioning: Postural control interferes with function (pt difficult to position due to his severe kyphosis and contractures) Baseline Vocal Quality: Low vocal intensity Volitional Cough: Cognitively unable to elicit Volitional Swallow: Unable to elicit    Oral/Motor/Sensory Function Overall Oral Motor/Sensory Function: Impaired (generalized weakness, pt did not commands for full evaluation, masked facies, severe dysarthria) Velum:  (could not view)   Ice Chips Ice chips: Not tested   Thin Liquid Thin Liquid: Impaired Presentation: Spoon;Straw Oral Phase Impairments: Reduced lingual movement/coordination;Impaired anterior to posterior transit;Poor awareness of bolus Oral Phase Functional Implications: Prolonged oral transit;Oral holding Pharyngeal  Phase Impairments: Suspected delayed Swallow    Nectar Thick Nectar Thick Liquid: Not tested   Honey Thick Honey Thick Liquid: Not tested   Puree Puree: Impaired Presentation: Spoon Oral Phase Impairments: Reduced labial seal;Reduced lingual movement/coordination;Impaired anterior to posterior transit;Poor awareness of bolus Oral Phase Functional Implications: Prolonged oral transit;Oral holding Pharyngeal Phase Impairments: Suspected delayed Swallow   Solid   GO     Solid: Not tested       Luanna Salk, [facility] SLP [phone]

## 2015-06-01 NOTE — Consult Note (Signed)
WOC wound consult note Reason for Consult:Multiples pressure injuries Wound type:Pressure and MASD, specifically incontinence associated dermatitis (IAD) Pressure Ulcer POA: Yes Measurement: Left Hip:  Stage 1 Pressure Injury measuring 3cm x 8cm (non-blanching erythema), Left iliac crest: Stage 1 Pressure injury measuring 1cm x 6cm (non-blanching erythema), Sacrum: 10cm x 3cm area of previously healed full thickness pressure injury (scar); perianal area: 1.5cm x 1cm x 0.2cm partial thickness IAD (patient with fecal incontinence), Left acromion process: 1.5cm x 3cm area with two ulcers, the larger of which is covered with dry stable eschar (Unstageable) and the other partial thickness measuring 0.2cm depth (Stage 2). Left heel 1cm round area of Non-blanching erythema (Stage1). Right heel: mushy area with dark discoloration measuring 3x3 cm round (DTPI). Right Hip: 7cm x 6.5cm area of red non-blanchable erythema with a deeper maroon discoloration embedded within it measuring 4cm x 2cm (DTPI).  Left medial knee: 1.5cm x 1cm area of maroon discoloration (DTPI). Left acromion process: 1cm x 6cm linear area of non-blanchable erythema (Stage 1) Drainage (amount, consistency, odor) As noted above Periwound: Dry, intact Dressing procedure/placement/frequency: Soft silicone dressings placed over sacrum, iliac crests, shoulders, hips.  Heels off loaded by placing feel in bilateral pressure redistribution boots.  A therapeutic mattress replacement with low air loss feature. Turning and repositioning will continue per protocol as well as bathing and general skin care using our house pH balanced, no rinse cleanser, moisturizer and moisture barrier products. A nutritional consult is suggested unless comfort care becomes the goal.  [facility] nursing team will not follow, but will remain available to this patient, the nursing and medical teams.  Please re-consult if needed. Thanks, Maudie Flakes, MSN, RN, [place], [place], [place]  [phone])

## 2015-06-01 NOTE — Progress Notes (Signed)
*  PRELIMINARY RESULTS* Vascular Ultrasound Left upper extremity venous duplex has been completed.  Preliminary findings: Technically limited due to patient being severely contracted.  Previous DVT appears to have resolved. No other DVT noted in the limited views.   Landry Mellow, RDMS, RVT   06/01/2015, 1:59 PM

## 2015-06-01 NOTE — H&P (Addendum)
[facility] History and Physical  Ricky [name] DTO:[id number] DOB: 1917/11/16 DOA: 05/31/2015  Referring physician: Dr. Nicholes Stairs. PCP: Hollace Kinnier, DO  Specialists: Dr. Nancy Nordmann. Neurologist.  Chief Complaint: Change in mental status.  History obtained from patient's son and your physician in previous records. Patient has dementia and presently is confused.  HPI: Ricky [name] is a 81 y.o. male with history of progressive supranuclear palsy, hypertension, hypothyroidism, protein calorie malnutrition and multiple decubitus ulcers was brought to the ER by patient's son after patient had suddenly change in mental status initially not responding but later became more alert but still not back to his baseline as per patient's son. Patient's son notices changes last night around 8:40 PM when patient also had some saliva coming from his nose and mouth. CT of the head did not show anything acute. In the ER UA shows features consistent with UTI and patient has been admitted for acute encephalopathy secondary to UTI. Patient on my exam is very minimally responsive. Try to resist my exam. Pupils are reacting. As per patient's son patient has been more on liquid diet over the last 2-3 weeks due to dysphagia progressing.   Review of Systems: As presented in the history of presenting illness, rest negative.  Past Medical History  Diagnosis Date  . IBS (irritable bowel syndrome)   . Diverticulosis   . Cataract   . OAB (overactive bladder)   . Macular degeneration   . Hypertension   . Parkinsonian syndrome   . Syncope, vasovagal   . 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 Unspecified transient cerebral ischemia   . Unspecified urinary incontinence   . Hypothyroidism   . Anemia, unspecified   . Depression   . Alzheimer's disease   . Gout, unspecified   . Thoracic or lumbosacral neuritis or radiculitis, unspecified   . Progressive supranuclear palsy   .  Paralysis agitans   . Pneumonitis due to inhalation of food or vomitus   . Cellulitis and abscess of oral soft tissues   . Unspecified hypothyroidism   . Pneumonia, organism unspecified   . Pseudomonas infection in conditions classified elsewhere and of unspecified site   . Urinary tract infection, site not specified   . Osteoarthrosis, unspecified whether generalized or localized, unspecified site   . Osteoporosis, unspecified   . Gout, unspecified   . Thoracic or lumbosacral neuritis or radiculitis, unspecified   . Stroke    Past Surgical History  Procedure Laterality Date  . Esophagogastroduodenoscopy    . Eye surgery  2006    LEFT CATARACT  . Esophagogastroduodenoscopy N/A 04/24/2015    Procedure: ESOPHAGOGASTRODUODENOSCOPY (EGD);  Surgeon: Carol Ada, MD;  Location: [facility] ENDOSCOPY;  Service: Endoscopy;  Laterality: N/A;   Social History:  reports that he has quit smoking. He has never used smokeless tobacco. He reports that he does not drink alcohol or use illicit drugs. Where does patient live home. Can patient participate in ADLs? No.  Allergies  Allergen Reactions  . Other Other (See Comments)    Dairy products- cause swallowing difficulty     Family History:  Family History  Problem Relation Age of Onset  . Cancer Mother   . Stroke Father   . Liver disease Father   . Kidney disease Brother   . Liver disease Brother   . Emphysema Brother       Prior to Admission medications   Medication Sig Start Date End Date Taking? Authorizing Provider  acetaminophen (  TYLENOL) 500 MG tablet Take 1,000 mg by mouth 3 (three) times daily.    Yes Historical Provider, MD  AMBULATORY NON FORMULARY MEDICATION Pressure Air Mattress Dx: G60.8, L89.150 10/30/14  Yes Tiffany L Reed, DO  bacitracin ointment Apply 1 application topically 2 (two) times daily. Applies to head of penis to decrease infection from catheter.   Yes Historical Provider, MD  Calcium Carbonate-Vit D-Min (CALTRATE  600+D PLUS) 600-800 MG-UNIT CHEW Chew 1 tablet by mouth 2 (two) times daily with a meal.    Yes Historical Provider, MD  CRANBERRY SOFT PO Take 2 capsules by mouth 3 (three) times daily.    Yes Historical Provider, MD  Cyanocobalamin (VITAMIN B-12) 5000 MCG SUBL Place 5,000 mcg under the tongue 2 (two) times daily.   Yes Historical Provider, MD  ferrous sulfate 325 (65 FE) MG tablet Take 325 mg by mouth every other day.    Yes Historical Provider, MD  guaiFENesin (MUCINEX) 600 MG 12 hr tablet Take 600 mg by mouth 2 (two) times daily as needed for cough.   Yes Historical Provider, MD  hydrALAZINE (APRESOLINE) 25 MG tablet Take 1/2 tablet by mouth twice daily to control blood pressure Patient taking differently: Take 12.5 mg by mouth 2 (two) times daily. Take 1/2 tablet by mouth twice daily to control blood pressure 11/14/14  Yes Mahima Pandey, MD  lactose free nutrition (BOOST) LIQD Take 90 mLs by mouth 3 (three) times daily between meals. Patient taking differently: Take 90 mLs by mouth 2 (two) times daily between meals.  07/01/14  Yes Barton Dubois, MD  levothyroxine (SYNTHROID, LEVOTHROID) 25 MCG tablet TAKE 1 TABLET BY MOUTH DAILY Patient taking differently: TAKE 25MCG BY MOUTH DAILY 05/14/15  Yes Tiffany L Reed, DO  lidocaine (LIDODERM) 5 % PLACE 3 PATCHES ONTO THE SKIN DAILY. REMOVE & DISCARD PATCH WITHIN 12 HOURS OR AS DIRECTED BY MD Patient taking differently: PLACE 1-3 PATCHES ONTO THE SKIN DAILY AS NEEDED FOR PAIN. REMOVE & DISCARD WITHIN 12 HOURS 02/16/15  Yes Tiffany L Reed, DO  memantine (NAMENDA) 10 MG tablet Take 10 mg by mouth 2 (two) times daily.   Yes Historical Provider, MD  Multiple Vitamins-Minerals (ICAPS AREDS FORMULA PO) Take 2 capsules by mouth 2 (two) times daily with a meal.    Yes Historical Provider, MD  NON FORMULARY D-Mannose, Take 2 teaspoon by mouth with fluids 2 times a day   Yes Historical Provider, MD  pantoprazole (PROTONIX) 40 MG tablet Take 1 tablet (40 mg total) by  mouth daily. 04/25/15  Yes Charlynne Cousins, MD  polyethylene glycol [facility] Floria Raveling) packet Take 17 g by mouth daily as needed for mild constipation. 07/01/14  Yes Barton Dubois, MD  Probiotic Product ([place]) CAPS Take 1 capsule by mouth 2 (two) times daily.   Yes Historical Provider, MD  psyllium (METAMUCIL) 58.6 % packet Take 1 packet by mouth 2 (two) times daily.    Yes Historical Provider, MD  rivaroxaban (XARELTO) 20 MG TABS tablet Take 1 tablet (20 mg total) by mouth daily with supper. 05/10/15  Yes Tiffany L Reed, DO  VOLTAREN 1 % GEL Apply 2 g topically 4 (four) times daily as needed (pain).  07/10/14  Yes Historical Provider, MD    Physical Exam: Filed Vitals:   05/31/15 2147 05/31/15 2245 05/31/15 2340 06/01/15 0150  BP: 133/83 139/88 123/77 120/67  Pulse: 67 70 63 55  Temp: 98.1 F (36.7 C)  97.3 F (36.3 C)  TempSrc: Oral  Oral   Resp: 13 16 16 20   SpO2: 98% 97% 97% 99%     General:  Poorly built and nourished.  Eyes: Anicteric no pallor.  ENT: No discharge from the ears eyes nose and mouth.  Neck: No mass felt.  Cardiovascular: S1 and S2 heard.  Respiratory: No rhonchi or crepitations.  Abdomen: Soft nontender bowel sounds present.  Skin: Has decubitus ulcer in the left heel sacrum left shoulder and right knee.  Musculoskeletal: No edema.  Psychiatric: Patient has dementia.  Neurologic: Alert awake but does not follow commands.  Labs on Admission:  Basic Metabolic Panel:  Recent Labs Lab 05/31/15 2243 06/01/15 0020  NA 131* 132*  K 4.4 4.2  CL 99* 101  CO2 22  --   GLUCOSE 121* 103*  BUN 27* 35*  CREATININE 1.04 1.10  CALCIUM 9.3  --    Liver Function Tests:  Recent Labs Lab 05/31/15 2243  AST 31  ALT 22  ALKPHOS 64  BILITOT 0.5  PROT 7.4  ALBUMIN 3.5   No results for input(s): LIPASE, AMYLASE in the last 168 hours. No results for input(s): AMMONIA in the last 168 hours. CBC:  Recent Labs Lab 06/01/15 0009  06/01/15 0020  WBC 6.2  --   NEUTROABS 4.9  --   HGB 14.0 17.0  HCT 43.1 50.0  MCV 89.2  --   PLT 132*  --    Cardiac Enzymes: No results for input(s): CKTOTAL, CKMB, CKMBINDEX, TROPONINI in the last 168 hours.  BNP (last 3 results) No results for input(s): BNP in the last 8760 hours.  ProBNP (last 3 results) No results for input(s): PROBNP in the last 8760 hours.  CBG:  Recent Labs Lab 05/31/15 2158  GLUCAP 132*    Radiological Exams on Admission: Dg Chest 2 View  05/31/2015   CLINICAL DATA:  81 year old male with possible aspiration.  EXAM: CHEST  2 VIEW  COMPARISON:  Radiograph 04/22/2015  FINDINGS: Evaluation is limited due to patient's kyphotic position as well as projection of the arms over the chest on the lateral view. No definite consolidation. No significant pleural effusion. No pneumothorax. Stable cardiomegaly. Osteopenia with degenerative changes of the spine.  IMPRESSION: No focal consolidation.   Electronically Signed   By: Anner Crete M.D.   On: 05/31/2015 23:11   Ct Head Wo Contrast  06/01/2015   CLINICAL DATA:  Altered mental status. Episode of unresponsiveness. This began about 1-1/2 hours ago. History of neurological disorder and nonverbal since gene.  EXAM: CT HEAD WITHOUT CONTRAST  TECHNIQUE: Contiguous axial images were obtained from the base of the skull through the vertex without intravenous contrast.  COMPARISON:  03/31/2015  FINDINGS: Examination is limited due to nonstandard position required because of the patient's severe kyphosis. Study is scanned in a predominantly coronal plane. Streak artifacts other fairly limits visualization.  There is evidence of diffuse cerebral atrophy with ventricular dilatation likely due to central atrophy. Low-attenuation changes in the white matter is probably due to small vessel ischemia. Low-attenuation changes appear to extend to the cortical surface in the right parietal region, likely representing old infarct.  There is no evidence of significant mass effect or midline shift. No abnormal extra-axial fluid collections. No evidence of acute intracranial hemorrhage. Calvarium appears intact. Retention cysts in the sphenoid sinuses. Mastoid air cells are not opacified.  IMPRESSION: Technically limited study. Diffuse atrophy and small vessel ischemic changes. Probable old infarct in the right parietal region. No acute  intracranial hemorrhage or midline shift.   Electronically Signed   By: Lucienne Capers M.D.   On: 06/01/2015 00:14   Dg Foot Complete Left  05/31/2015   CLINICAL DATA:  81 year old male with fall sore to the heel of foot  EXAM: LEFT FOOT - COMPLETE 3+ VIEW  COMPARISON:  None.  FINDINGS: No acute fracture or dislocation. There is diffuse osteopenia. The soft tissues are grossly unremarkable. No radiopaque foreign object identified.  IMPRESSION: Osteopenia. No acute fracture or dislocation. No radiopaque foreign object.   Electronically Signed   By: Anner Crete M.D.   On: 05/31/2015 23:57    EKG: Independently reviewed. Rhythm not clear but has RBBB.  Assessment/Plan Principal Problem:   Acute encephalopathy Active Problems:   PULMONARY EMBOLISM, HX OF   CKD (chronic kidney disease), stage II   Decubitus ulcer of sacral region, unstageable   UTI (lower urinary tract infection)   Dementia   DVT of upper extremity (deep vein thrombosis)   1. Acute encephalopathy in a patient with history of progressive supranuclear palsy - probably secondary to UTI. Patient has been placed on ceftriaxone. Closely follow mental status changes. 2. Dysphagia - patient has been kept nothing by mouth at this time and we will get speech therapist for further recommendations and diet. 3. Hypertension - under the patient can take orally will keep patient on when necessary IV hydralazine. 4. Hypothyroidism - may have to change to IV Synthroid if patient cannot swallow. 5. History of left upper extremity DVT -  patient is on xarelto. On reviewing patient's Doppler studies patient has had superficial thrombosis of the left upper extremity. I have ordered repeat Dopplers and if negative may consider changing to aspirin. If patient is not able to swallow then may need heparin IV or Lovenox if there is definite DVT. 6. Admitted in month of June 2016 for GI bleed. 7. Protein calorie malnutrition - diet per therapist. 8. Multiple decubitus ulcers - wound care requested.  I have reviewed patient's old charts and old labs. Personally reviewed patient's EKG and chest x-ray. Palliative care consult requested.   DVT Prophylaxis patient is on xarelto.  Code Status: DO NOT RESUSCITATE.  Family Communication: Discussed with patient's son.  Disposition Plan: Admit to inpatient.    KAKRAKANDY,ARSHAD N. [facility] Pager [phone].  If 7PM-7AM, please contact night-coverage [email or web address] Password [facility] 06/01/2015, 3:28 AM

## 2015-06-01 NOTE — Consult Note (Signed)
Consultation Note Date: 06/01/2015   Patient Name: Ricky [name]  DOB: [birth year]  MRN: [id number]  Age / Sex: 81 y.o., male   PCP: Gayland Curry, DO Referring Physician: Louellen Molder, MD  Reason for Consultation: Establishing goals of care  Palliative Care Assessment and Plan Summary of Established Goals of Care and Medical Treatment Preferences   Ricky [name] is a 81 y.o. male with history of progressive supranuclear palsy, hypertension, hypothyroidism, protein calorie malnutrition and multiple decubitus ulcers was brought to the ER by patient's son after patient had suddenly change in mental status initially not responding but later became more alert but still not back to his baseline as per patient's son.   Patient lives at home with son. Patient has been admitted with acute encephalopathy, urinary tract infection. Patient since admission, has been seen and evaluated by speech therapy. Patient has been recommended for dysphagic pured diet no solid foods. Patient known to palliative service seen by nurse practitioner [facility] in hospitalization in May 2016. CODE STATUS DO NOT RESUSCITATE established at that time reconfirmed for this hospitalization as well.  Palliative consult placed for goals of care discussions. Patient with severe upper and lower extremity contractures. Patient opens eyes to voice command. He is able to answer very simple yes/no questions. Overall, does not appear to be in any acute distress.  Call placed and discussed with patient's son Ricky [name]. Continue to observe disease trajectory. Will review most form with the patient's son and daughter in a family meeting over the weekend based on been patient's daughter Ricky [name] who is a flight attendant is available. Additional recommendations will follow. Son Ricky [name] states he acknowledges that the patient is getting closer to the final chapter of his life. We will review the most form in family meeting. We will initiate hospice  discussions and family meeting. Continue current mode of care for now. Additional recommendations will follow.  Contacts/Participants in Discussion: Primary Decision Maker: Ricky [name] and son Ricky [name]  HCPOA: yes     Code Status/Advance Care Planning:  Patient known to palliative service. CODE STATUS established as DO NOT RESUSCITATE in hospitalization in May 2016. The most form also completed at that time.  Symptom Management:   No acute symptoms currently. Continue to monitor for nonverbal signs of distress/discomfort.  Palliative Prophylaxis: Yes  Additional Recommendations (Limitations, Scope, Preferences):  Offered family meeting with patient's primary decision makers son Ricky [name] as well as daughter Ricky [name] over the weekend.  Continue supportive care. Aspiration precautions. Psycho-social/Spiritual:   Support System:   Desire for further Chaplaincy support:no  Prognosis:  Patient likely in the few weeks-few months category for life expectancy based on gradual subacute decline, diminishing by mouth intake, recurrent  hospitalizations recently.  Discharge Planning: We will recommend Home with Hospice  Values: CODE STATUS DO NOT RESUSCITATE. Gentle treatments. Family states they acknowledge patient is with gradual progressive ongoing decline even since his last hospitalization in May 2016. Life limiting illness: Progressive super nuclear palsy, now admitted for acute encephalopathy, urinary tract infection, ongoing dysphasia      Chief Complaint/History of Present Illness: Altered mental status  Primary Diagnoses  Present on Admission:  . UTI (lower urinary tract infection) . Acute encephalopathy . CKD (chronic kidney disease), stage II . Decubitus ulcer of sacral region, unstageable . Dementia . DVT of upper extremity (deep vein thrombosis)  Palliative Review of Systems: Noted I have reviewed the medical record, interviewed the patient and family, and  examined the patient. The following  aspects are pertinent.  Past Medical History  Diagnosis Date  . IBS (irritable bowel syndrome)   . Diverticulosis   . Cataract   . OAB (overactive bladder)   . Macular degeneration   . Hypertension   . Parkinsonian syndrome   . Syncope, vasovagal   . 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 Unspecified transient cerebral ischemia   . Unspecified urinary incontinence   . Hypothyroidism   . Anemia, unspecified   . Depression   . Alzheimer's disease   . Gout, unspecified   . Thoracic or lumbosacral neuritis or radiculitis, unspecified   . Progressive supranuclear palsy   . Paralysis agitans   . Pneumonitis due to inhalation of food or vomitus   . Cellulitis and abscess of oral soft tissues   . Unspecified hypothyroidism   . Pneumonia, organism unspecified   . Pseudomonas infection in conditions classified elsewhere and of unspecified site   . Urinary tract infection, site not specified   . Osteoarthrosis, unspecified whether generalized or localized, unspecified site   . Osteoporosis, unspecified   . Gout, unspecified   . Thoracic or lumbosacral neuritis or radiculitis, unspecified   . Stroke    History   Social History  . Marital Status: Single    Spouse Name: N/A  . Number of Children: N/A  . Years of Education: N/A   Social History Main Topics  . Smoking status: Former Research scientist (life sciences)  . Smokeless tobacco: Never Used     Comment: QUIT SMOKING BACK IN THE LATE 50'S  . Alcohol Use: No  . Drug Use: No  . Sexual Activity: No   Other Topics Concern  . None   Social History Narrative   Family History  Problem Relation Age of Onset  . Cancer Mother   . Stroke Father   . Liver disease Father   . Kidney disease Brother   . Liver disease Brother   . Emphysema Brother    Scheduled Meds: . antiseptic oral rinse  7 mL Mouth Rinse q12n4p  . calcium-vitamin D  1 tablet Oral BID WC  . [START ON 06/02/2015]  cefTRIAXone (ROCEPHIN)  IV  1 g Intravenous Q24H  . chlorhexidine  15 mL Mouth Rinse BID  . [START ON 06/02/2015] ferrous sulfate  325 mg Oral QODAY  . hydrALAZINE  12.5 mg Oral BID  . lactose free nutrition  90 mL Oral BID BM  . levothyroxine  25 mcg Oral QAC breakfast  . lidocaine  1 patch Transdermal Q24H  . memantine  10 mg Oral BID  . pantoprazole  40 mg Oral Daily  . psyllium  1 packet Oral BID  . rivaroxaban  20 mg Oral Q breakfast  . vitamin B-12  5,000 mcg Oral BID   Continuous Infusions: . sodium chloride 10 mL/hr at 06/01/15 0432   PRN Meds:.acetaminophen **OR** acetaminophen, diclofenac sodium, guaiFENesin, hydrALAZINE, ondansetron **OR** ondansetron (ZOFRAN) IV, polyethylene glycol Medications Prior to Admission:  Prior to Admission medications   Medication Sig Start Date End Date Taking? Authorizing Provider  acetaminophen (TYLENOL) 500 MG tablet Take 1,000 mg by mouth 3 (three) times daily.    Yes Historical Provider, MD  AMBULATORY NON FORMULARY MEDICATION Pressure Air Mattress Dx: G60.8, L89.150 10/30/14  Yes Tiffany L Reed, DO  bacitracin ointment Apply 1 application topically 2 (two) times daily. Applies to head of penis to decrease infection from catheter.   Yes Historical Provider, MD  Calcium Carbonate-Vit D-Min (CALTRATE 600+D PLUS) 600-800  MG-UNIT CHEW Chew 1 tablet by mouth 2 (two) times daily with a meal.    Yes Historical Provider, MD  CRANBERRY SOFT PO Take 2 capsules by mouth 3 (three) times daily.    Yes Historical Provider, MD  Cyanocobalamin (VITAMIN B-12) 5000 MCG SUBL Place 5,000 mcg under the tongue 2 (two) times daily.   Yes Historical Provider, MD  ferrous sulfate 325 (65 FE) MG tablet Take 325 mg by mouth every other day.    Yes Historical Provider, MD  guaiFENesin (MUCINEX) 600 MG 12 hr tablet Take 600 mg by mouth 2 (two) times daily as needed for cough.   Yes Historical Provider, MD  hydrALAZINE (APRESOLINE) 25 MG tablet Take 1/2 tablet by mouth  twice daily to control blood pressure Patient taking differently: Take 12.5 mg by mouth 2 (two) times daily. Take 1/2 tablet by mouth twice daily to control blood pressure 11/14/14  Yes Mahima Pandey, MD  lactose free nutrition (BOOST) LIQD Take 90 mLs by mouth 3 (three) times daily between meals. Patient taking differently: Take 90 mLs by mouth 2 (two) times daily between meals.  07/01/14  Yes Barton Dubois, MD  levothyroxine (SYNTHROID, LEVOTHROID) 25 MCG tablet TAKE 1 TABLET BY MOUTH DAILY Patient taking differently: TAKE 25MCG BY MOUTH DAILY 05/14/15  Yes Tiffany L Reed, DO  lidocaine (LIDODERM) 5 % PLACE 3 PATCHES ONTO THE SKIN DAILY. REMOVE & DISCARD PATCH WITHIN 12 HOURS OR AS DIRECTED BY MD Patient taking differently: PLACE 1-3 PATCHES ONTO THE SKIN DAILY AS NEEDED FOR PAIN. REMOVE & DISCARD WITHIN 12 HOURS 02/16/15  Yes Tiffany L Reed, DO  memantine (NAMENDA) 10 MG tablet Take 10 mg by mouth 2 (two) times daily.   Yes Historical Provider, MD  Multiple Vitamins-Minerals (ICAPS AREDS FORMULA PO) Take 2 capsules by mouth 2 (two) times daily with a meal.    Yes Historical Provider, MD  NON FORMULARY D-Mannose, Take 2 teaspoon by mouth with fluids 2 times a day   Yes Historical Provider, MD  pantoprazole (PROTONIX) 40 MG tablet Take 1 tablet (40 mg total) by mouth daily. 04/25/15  Yes Charlynne Cousins, MD  polyethylene glycol [facility] Floria Raveling) packet Take 17 g by mouth daily as needed for mild constipation. 07/01/14  Yes Barton Dubois, MD  Probiotic Product ([place]) CAPS Take 1 capsule by mouth 2 (two) times daily.   Yes Historical Provider, MD  psyllium (METAMUCIL) 58.6 % packet Take 1 packet by mouth 2 (two) times daily.    Yes Historical Provider, MD  rivaroxaban (XARELTO) 20 MG TABS tablet Take 1 tablet (20 mg total) by mouth daily with supper. 05/10/15  Yes Tiffany L Reed, DO  VOLTAREN 1 % GEL Apply 2 g topically 4 (four) times daily as needed (pain).  07/10/14  Yes Historical  Provider, MD   Allergies  Allergen Reactions  . Other Other (See Comments)    Dairy products- cause swallowing difficulty    CBC:    Component Value Date/Time   WBC 5.3 06/01/2015 0523   WBC 6.5 04/27/2015 1144   HGB 12.6* 06/01/2015 0523   HCT 38.8* 06/01/2015 0523   HCT 36.6* 04/27/2015 1144   PLT 166 06/01/2015 0523   MCV 89.2 06/01/2015 0523   NEUTROABS 3.3 06/01/2015 0523   NEUTROABS 5.1 04/27/2015 1144   LYMPHSABS 1.5 06/01/2015 0523   LYMPHSABS 0.9 04/27/2015 1144   MONOABS 0.5 06/01/2015 0523   EOSABS 0.0 06/01/2015 0523   EOSABS 0.1 04/13/2014 1538  BASOSABS 0.0 06/01/2015 0523   BASOSABS 0.0 04/27/2015 1144   Comprehensive Metabolic Panel:    Component Value Date/Time   NA 133* 06/01/2015 0523   NA 138 04/13/2014 1538   K 4.1 06/01/2015 0523   CL 100* 06/01/2015 0523   CO2 26 06/01/2015 0523   BUN 27* 06/01/2015 0523   BUN 14 04/13/2014 1538   CREATININE 0.88 06/01/2015 0523   GLUCOSE 90 06/01/2015 0523   GLUCOSE 86 04/13/2014 1538   CALCIUM 9.1 06/01/2015 0523   AST 26 06/01/2015 0523   ALT 23 06/01/2015 0523   ALKPHOS 60 06/01/2015 0523   BILITOT 0.5 06/01/2015 0523   PROT 7.2 06/01/2015 0523   PROT 7.6 01/24/2014 1203   ALBUMIN 3.4* 06/01/2015 0523    Physical Exam: Vital Signs: BP 141/72 mmHg  Pulse 58  Temp(Src) 97.7 F (36.5 C) (Oral)  Resp 16  Wt 53.7 kg (118 lb 6.2 oz)  SpO2 100% SpO2: SpO2: 100 % O2 Device: O2 Device: Nasal Cannula O2 Flow Rate: O2 Flow Rate (L/min): 2 L/min Intake/output summary:  Intake/Output Summary (Last 24 hours) at 06/01/15 1620 Last data filed at 06/01/15 0554  Gross per 24 hour  Intake      0 ml  Output    300 ml  Net   -300 ml   LBM: Last BM Date: 06/01/15 Baseline Weight: Weight: 53.7 kg (118 lb 6.2 oz) Most recent weight: Weight: 53.7 kg (118 lb 6.2 oz)  Exam Findings:  Weak elderly gentleman. Multiple contractures. Opens eyes to name being called. Does not verbalize much. Diminished  anteriorly S1-S2 Abdomen soft Skin decubitus ulcers Multiple contractures upper and lower extremities                       Palliative Performance Scale: 30 Additional Data Reviewed: Recent Labs     06/01/15  0009  06/01/15  0020  06/01/15  0523  WBC  6.2   --   5.3  HGB  14.0  17.0  12.6*  PLT  132*   --   166  NA   --   132*  133*  BUN   --   35*  27*  CREATININE   --   1.10  0.88     Time In: 1000 Time Out: 1055 Time Total: 55 min Greater than 50%  of this time was spent counseling and coordinating care related to the above assessment and plan.  Signed by: Loistine Chance, MD [place], MD  06/01/2015, 4:20 PM  Please contact Palliative Medicine Team phone at [phone] for questions and concerns.

## 2015-06-02 DIAGNOSIS — E43 Unspecified severe protein-calorie malnutrition: Secondary | ICD-10-CM

## 2015-06-02 NOTE — Progress Notes (Signed)
[facility] PROGRESS NOTE  Ricky [name] EZM:[id number] DOB: Jan 29, 1917 DOA: 05/31/2015 PCP: Hollace Kinnier, DO  Brief narrative 81 year old male with progressive supra and calcium, hypertension, hypothyroidism, severe protein calorie malnutrition and multiple decubitus ulcer was brought to the ED by his family after patient had sudden change in mental status and patient not responding well. In the ED patient was found to have UTI. Patient also had poor by mouth intake at home.  At baseline he smiles, speaks a few sentences, laughs and jokes.  Assessment/Plan: Acute encephalopathy with history of progressive supranuclear palsy Worsened with underlying UTI. Continue empiric Rocephin. Mental status appears to be slowly improving. Culture was not sent on admission.  Dysphagia Seen by swallow nurse and recommend pured diet.  Hypertension Stable. Continue hydralazine.  Severe protein calorie malnutrition Continue supplement.  Hypothyroidism Continue Synthroid  Severe dementia Continue Namenda  Iron deficiency anemia Continue iron supplement.  History of left upper extremity DVT Doppler repeated. Limited study due to patient being very contracted but previous DVT seems to have resolved. Xarelto discontinued.  Progressive supranuclear palsy with severe deconditioning Palliative care consulted this admission again and recommend home hospice given the gradual decline since his last hospitalization. Reevaluate again tomorrow and have family meeting. Discussed at length with patient's son and daughter on the phone today. They would like to have some time and see if patient gets better and back to his baseline. They reason that during multiple past hospitalization patient has gotten better and back to his baseline but not since his last hospitalization one month back.  Code Status: DO NOT RESUSCITATE Family Communication: spoke  with son and daughter on the phone. Disposition  Plan: Possibly home with home hospice on 7/18   Consultants:  Palliative care  Procedures:  Head CT  Antibiotics:  IV Rocephin  HPI/Subjective: Patient seen and examined. Response to commands with eye opening. Still nonverbal.  Objective: Filed Vitals:   06/02/15 0600  BP: 110/69  Pulse: 71  Temp: 98 F (36.7 C)  Resp: 16    Intake/Output Summary (Last 24 hours) at 06/02/15 1155 Last data filed at 06/02/15 1010  Gross per 24 hour  Intake 304.67 ml  Output      0 ml  Net 304.67 ml   Filed Weights   06/01/15 0300  Weight: 53.7 kg (118 lb 6.2 oz)    Exam:   General:  Elderly male lying in bed cachectic, responds with eye opening only  HEENT: Temporal wasting, poor dentition, moist mucosa  Chest: Clear to auscultation bilaterally  CVS: Normal S1 and S2, no murmurs  GI: Soft, nondistended, nontender, bowel sounds present  Musculoskeletal: Noninfected sacral decubitus ulcers  CNS: Awake to commands, nonverbal    Data Reviewed: Basic Metabolic Panel:  Recent Labs Lab 05/31/15 2243 06/01/15 0020 06/01/15 0523  NA 131* 132* 133*  K 4.4 4.2 4.1  CL 99* 101 100*  CO2 22  --  26  GLUCOSE 121* 103* 90  BUN 27* 35* 27*  CREATININE 1.04 1.10 0.88  CALCIUM 9.3  --  9.1   Liver Function Tests:  Recent Labs Lab 05/31/15 2243 06/01/15 0523  AST 31 26  ALT 22 23  ALKPHOS 64 60  BILITOT 0.5 0.5  PROT 7.4 7.2  ALBUMIN 3.5 3.4*   No results for input(s): LIPASE, AMYLASE in the last 168 hours. No results for input(s): AMMONIA in the last 168 hours. CBC:  Recent Labs Lab 06/01/15 0009 06/01/15 0020 06/01/15 0523  WBC  6.2  --  5.3  NEUTROABS 4.9  --  3.3  HGB 14.0 17.0 12.6*  HCT 43.1 50.0 38.8*  MCV 89.2  --  89.2  PLT 132*  --  166   Cardiac Enzymes: No results for input(s): CKTOTAL, CKMB, CKMBINDEX, TROPONINI in the last 168 hours. BNP (last 3 results) No results for input(s): BNP in the last 8760 hours.  ProBNP (last 3  results) No results for input(s): PROBNP in the last 8760 hours.  CBG:  Recent Labs Lab 05/31/15 2158  GLUCAP 132*    No results found for this or any previous visit (from the past 240 hour(s)).   Studies: Dg Chest 2 View  05/31/2015   CLINICAL DATA:  81 year old male with possible aspiration.  EXAM: CHEST  2 VIEW  COMPARISON:  Radiograph 04/22/2015  FINDINGS: Evaluation is limited due to patient's kyphotic position as well as projection of the arms over the chest on the lateral view. No definite consolidation. No significant pleural effusion. No pneumothorax. Stable cardiomegaly. Osteopenia with degenerative changes of the spine.  IMPRESSION: No focal consolidation.   Electronically Signed   By: Anner Crete M.D.   On: 05/31/2015 23:11   Ct Head Wo Contrast  06/01/2015   CLINICAL DATA:  Altered mental status. Episode of unresponsiveness. This began about 1-1/2 hours ago. History of neurological disorder and nonverbal since gene.  EXAM: CT HEAD WITHOUT CONTRAST  TECHNIQUE: Contiguous axial images were obtained from the base of the skull through the vertex without intravenous contrast.  COMPARISON:  03/31/2015  FINDINGS: Examination is limited due to nonstandard position required because of the patient's severe kyphosis. Study is scanned in a predominantly coronal plane. Streak artifacts other fairly limits visualization.  There is evidence of diffuse cerebral atrophy with ventricular dilatation likely due to central atrophy. Low-attenuation changes in the white matter is probably due to small vessel ischemia. Low-attenuation changes appear to extend to the cortical surface in the right parietal region, likely representing old infarct. There is no evidence of significant mass effect or midline shift. No abnormal extra-axial fluid collections. No evidence of acute intracranial hemorrhage. Calvarium appears intact. Retention cysts in the sphenoid sinuses. Mastoid air cells are not opacified.   IMPRESSION: Technically limited study. Diffuse atrophy and small vessel ischemic changes. Probable old infarct in the right parietal region. No acute intracranial hemorrhage or midline shift.   Electronically Signed   By: Lucienne Capers M.D.   On: 06/01/2015 00:14   Dg Foot Complete Left  05/31/2015   CLINICAL DATA:  81 year old male with fall sore to the heel of foot  EXAM: LEFT FOOT - COMPLETE 3+ VIEW  COMPARISON:  None.  FINDINGS: No acute fracture or dislocation. There is diffuse osteopenia. The soft tissues are grossly unremarkable. No radiopaque foreign object identified.  IMPRESSION: Osteopenia. No acute fracture or dislocation. No radiopaque foreign object.   Electronically Signed   By: Anner Crete M.D.   On: 05/31/2015 23:57    Scheduled Meds: . antiseptic oral rinse  7 mL Mouth Rinse q12n4p  . calcium-vitamin D  1 tablet Oral BID WC  . cefTRIAXone (ROCEPHIN)  IV  1 g Intravenous Q24H  . chlorhexidine  15 mL Mouth Rinse BID  . ferrous sulfate  325 mg Oral QODAY  . hydrALAZINE  12.5 mg Oral BID  . lactose free nutrition  90 mL Oral BID BM  . levothyroxine  25 mcg Oral QAC breakfast  . lidocaine  1 patch Transdermal Q24H  .  memantine  10 mg Oral BID  . pantoprazole  40 mg Oral Daily  . psyllium  1 packet Oral BID  . vitamin B-12  5,000 mcg Oral BID   Continuous Infusions:     Time spent: 25 minutes    Louellen Molder  [facility] Pager [phone] If 7PM-7AM, please contact night-coverage at [email or web address], password [facility] 06/02/2015, 11:55 AM  LOS: 1 day

## 2015-06-02 NOTE — Care Management Note (Signed)
Case Management Note  Patient Details  Name: Ricky [name] MRN: [id number] Date of Birth: 11/10/1917  Subjective/Objective:                  Acute encephalopathy   Action/Plan:  Discharge Planning Expected Discharge Date:   (unknown)               Expected Discharge Plan:  Home w Hospice Care  In-House Referral:     Discharge planning Services  CM Consult  Post Acute Care Choice:  Hospice Choice offered to:  Adult Children  DME Arranged:    DME Agency:     HH Arranged:    HH Agency:     Status of Service:  In process, will continue to follow  Medicare Important Message Given:    Date Medicare IM Given:    Medicare IM give by:    Date Additional Medicare IM Given:    Additional Medicare Important Message give by:     If discussed at [place] of Stay Meetings, dates discussed:    Additional Comments:  CM spoke with patient's daughter [name], [phone]. Plans for the patient to return to his home at discharge with his son who live there with him. [name] is open to home hospice and states they met with [facility] in June but declined services due to him being unable to receive IV antibiotics if they accepted services. Informed a list of Home Hospice Providers will be left in the patient's room for the family to review. She plans to contact the providers and notify the patient's nurse or CM once they have selected an agency. Apolonio Schneiders, RN 06/02/2015, 4:31 PM

## 2015-06-02 NOTE — Progress Notes (Signed)
Pharmacy: ceftriaxone  Patient's a 81 y.o M on ceftriaxone day #2 for suspected UTI.  He remains afebrile, wbc wnl, no cultures.  Plan: - continue ceftriaxone 1gm IV q24h - pharmacy will sign off for ceftriaxone since current dose is appropriate for indication and no renal adjustment is needed with this abx - re-consult [place] if need further assistance  Thank you for asking pharmacy to participate in this patient's care.  Dia Sitter, PharmD, BCPS 06/02/2015 10:59 AM

## 2015-06-02 NOTE — Progress Notes (Addendum)
PT Cancellation/Screen Note  Patient Details Name: Ricky [name] MRN: [id number] DOB: 1917-08-03   Cancelled Treatment:    Reason Eval/Treat Not Completed: PT screened, no needs identified, will sign off. Order received. Chart reviewd. Familiar with pt from previous admissions. Pt is total assist for all care/mobility. Hoyer lift may be beneficial for transfers if family wishes to have this at home. Pt is not appropriate for skilled PT at acute level. Note pt to return home with hospice.   Weston Anna, MPT Pager: [phone]

## 2015-06-03 DIAGNOSIS — F0390 Unspecified dementia without behavioral disturbance: Secondary | ICD-10-CM

## 2015-06-03 DIAGNOSIS — G231 Progressive supranuclear ophthalmoplegia [Steele-Richardson-Olszewski]: Secondary | ICD-10-CM

## 2015-06-03 MED ORDER — SULFAMETHOXAZOLE-TRIMETHOPRIM 400-80 MG PO TABS: 1.0000 | ORAL_TABLET | Freq: Two times a day (BID) | ORAL | Status: AC

## 2015-06-03 NOTE — Progress Notes (Signed)
Daily Progress Note   Patient Name: Ricky [name]       Date: 06/03/2015 DOB: 11/21/1916  Age: 81 y.o. MRN#: [id number] Attending Physician: Louellen Molder, MD Primary Care Physician: Hollace Kinnier, DO Admit Date: 05/31/2015  Reason for Consultation/Follow-up: Establishing goals of care  Subjective:  some what more awake alert, opens eyes, does drink when daughter assists.  Goals of care:  Discussed with Dr Clementeen Graham and with patient's daughter [name]. Patient's son [name] was able to join over the phone. Patient's current medical conditions discussed. Plan is for the patient to go home with augmentation of home health services, addition of a Education officer, museum, home physical therapy. Family will monitor the patient's disease trajectory posthospitalization for the next 30-45 days. The patient has not returned to baseline, or if the patient has other acute conditions in this time period, family will consider hospice consultation and at that time, will consider changing the focus to comfort measures.  Length of Stay: 2 days  Current Medications: Scheduled Meds:  . antiseptic oral rinse  7 mL Mouth Rinse q12n4p  . calcium-vitamin D  1 tablet Oral BID WC  . cefTRIAXone (ROCEPHIN)  IV  1 g Intravenous Q24H  . chlorhexidine  15 mL Mouth Rinse BID  . ferrous sulfate  325 mg Oral QODAY  . hydrALAZINE  12.5 mg Oral BID  . lactose free nutrition  90 mL Oral BID BM  . levothyroxine  25 mcg Oral QAC breakfast  . lidocaine  1 patch Transdermal Q24H  . memantine  10 mg Oral BID  . pantoprazole  40 mg Oral Daily  . psyllium  1 packet Oral BID  . vitamin B-12  5,000 mcg Oral BID    Continuous Infusions:    PRN Meds: acetaminophen **OR** acetaminophen, diclofenac sodium, guaiFENesin, hydrALAZINE, ondansetron **OR** ondansetron (ZOFRAN) IV, polyethylene glycol  Palliative Performance Scale: 30%     Vital Signs: BP 134/73 mmHg  Pulse 54  Temp(Src) 98.2 F (36.8 C) (Oral)  Resp 18  Wt 53.7  kg (118 lb 6.2 oz)  SpO2 100% SpO2: SpO2: 100 % O2 Device: O2 Device: Nasal Cannula O2 Flow Rate: O2 Flow Rate (L/min): 2 L/min  Intake/output summary:  Intake/Output Summary (Last 24 hours) at 06/03/15 1528 Last data filed at 06/03/15 0935  Gross per 24 hour  Intake      0 ml  Output    600 ml  Net   -600 ml   LBM:   Baseline Weight: Weight: 53.7 kg (118 lb 6.2 oz) Most recent weight: Weight: 53.7 kg (118 lb 6.2 oz)  Physical Exam:   Weak frail gentleman in no acute distress opens eyes to voice command Shallow breathing, contractures S1-S2 Abdomen soft Sacral decubitus ulcers present on admission Awake does not verbalize           Additional Data Reviewed: Recent Labs     06/01/15  0009  06/01/15  0020  06/01/15  0523  WBC  6.2   --   5.3  HGB  14.0  17.0  12.6*  PLT  132*   --   166  NA   --   132*  133*  BUN   --   35*  27*  CREATININE   --   1.10  0.88     Problem List:  Patient Active Problem List   Diagnosis Date Noted  . Encounter for palliative care   . GI bleed 04/25/2015  . Dementia 04/22/2015  .  Iron deficiency anemia 04/22/2015  . DVT of upper extremity (deep vein thrombosis)   . Palliative care encounter 04/13/2015  . DNR (do not resuscitate) discussion 04/13/2015  . Dysphagia, pharyngoesophageal phase 04/13/2015  . Adult failure to thrive 04/13/2015  . Pressure ulcer 04/12/2015  . AKI (acute kidney injury) 04/06/2015  . Decreased level of consciousness   . Dehydration   . Shingles 03/31/2015  . UTI (lower urinary tract infection) 03/25/2015  . SOB (shortness of breath)   . Urinary tract infection due to [place] 01/14/2015  . Supranuclear palsies, progressive 01/10/2015  . Hematuria 01/10/2015  . Rectal bleeding 06/28/2014  . Physical deconditioning 03/31/2014  . Sepsis 03/22/2014  . Supranuclear palsy 03/22/2014  . Decubitus ulcer of sacral region, unstageable 03/22/2014  . Hypokalemia 03/22/2014  . Hyponatremia 03/22/2014  .  Protein-calorie malnutrition, severe 03/21/2014  . HCAP (healthcare-associated pneumonia) 03/20/2014  . Fever 03/20/2014  . Metabolic encephalopathy, acute 03/20/2014  . Pulmonary edema 03/20/2014  . Weakness 03/09/2014  . Fall 03/09/2014  . Acute encephalopathy 01/24/2014  . CKD (chronic kidney disease), stage II 01/16/2014  . Anemia, chronic disease 01/16/2014  . Elevated INR 01/15/2014  . Gross hematuria 01/15/2014  . Hyperlipidemia 01/15/2014  . Achilles tendonitis 05/26/2013  . Toxic encephalopathy, infectious versus progressive supranuclear palsy 05/15/2013  . FTT (failure to thrive) in adult 01/27/2013  . Dysphagia 01/27/2013  . BPH (benign prostatic hyperplasia) 10/19/2012  . CVA (cerebral infarction) 06/22/2012  . Left-sided weakness 06/20/2012  . Constipation, chronic 02/17/2012  . Alzheimer disease   . Hypertension   . Parkinsonian syndrome   . Syncope, vasovagal   . Syncope 11/12/2011  . Rectal prolapse 09/18/2011  . Hypothyroidism 09/24/2009  . Gout, unspecified 09/24/2009  . [place] DISEASE 09/24/2009  . Gastroparesis 09/24/2009  . History of vasovagal syncope 09/24/2009  . PULMONARY EMBOLISM, HX OF 09/24/2009     Palliative Care Assessment & Plan    Code Status:  DNR  Goals of Care:   Continue current measures patient to discharge home with home health care  Desire for further Chaplaincy support:no  3. Symptom Management:   No acute symptoms currently  4. Palliative Prophylaxis:  Stool [place]: yes  5. Prognosis: Unable to determine  5. Discharge Planning: Home with [place] was discussed with patient's daughter, patient's son, Dr. Delena Serve  Thank you for allowing the Palliative Medicine Team to assist in the care of this patient.   Time In: 1500 Time Out: 1535 Total Time 35 Prolonged Time Billed  no     Greater than 50%  of this time was spent counseling and coordinating care related to the above assessment and  plan.  [place], MD  06/03/2015, 3:28 PM  Please contact Palliative Medicine Team phone at [phone] for questions and concerns.

## 2015-06-03 NOTE — Discharge Summary (Signed)
Physician Discharge Summary  Ricky [name] [id number]:[id number] DOB: Nov 13, 1917 DOA: 05/31/2015  PCP: Ricky Kinnier, DO  Admit date: 05/31/2015 Discharge date: 06/04/2015  Time spent: 35  minutes  Recommendations for Outpatient Follow-up:  1. D/c home with [facility], PT , nursing aide and SW 2. Family will decide about further goals fo care ( home with home hospice ) if patient does not improve after about 30 days. 3. Completes  7 days of abx on 06/08/2015   Discharge Diagnoses:  Principal Problem:   Acute encephalopathy   Active Problems:    Toxic encephalopathy, infectious versus progressive supranuclear palsy   CKD (chronic kidney disease), stage II   Decubitus ulcer of sacral region, unstageable   Supranuclear palsies, progressive   UTI (lower urinary tract infection)   Dementia   DVT of upper extremity (deep vein thrombosis)   Encounter for palliative care   Discharge Condition: guarded  Diet recommendation: dys level 1 (puree) with supplements  Filed Weights   06/01/15 0300  Weight: 53.7 kg (118 lb 6.2 oz)    History of present illness:  81 year old male with progressive supranuclear palsy , hypertension, hypothyroidism, severe protein calorie malnutrition and multiple decubitus ulcer was brought to the ED by his family after patient had sudden change in mental status and patient not responding well. In the ED patient was found to have UTI. Patient also had poor by mouth intake at home.  At baseline he smiles, speaks a few sentences, laughs and jokes.he needs help with all his ADLs and is bedbound with being able to move to chair with physical assist.  Hospital Course:  Acute encephalopathy with history of progressive supranuclear palsy Worsened with underlying UTI. Continue empiric Rocephin. Mental status appears to be improving to baseline. Culture was not sent on admission. Previous cultures growing Pseudomonas and enterococcus which appears to be pansensitive. I will  discharge him on oral Bactrim to complete a total 7 day course of antibiotic.  Dysphagia Seen by swallow nurse and recommend pured diet.  Hypertension Stable. Continue hydralazine.  Severe protein calorie malnutrition Continue supplement.  Hypothyroidism Continue Synthroid  Severe dementia Continue Namenda  Iron deficiency anemia Continue iron supplement.  History of left upper extremity DVT Doppler repeated. Limited study due to patient being very contracted but previous DVT seems to have resolved. Xarelto has been discontinued.  Progressive supranuclear palsy with severe deconditioning  Palliative care consulted this admission again and recommend home hospice given the progressive decline since his last hospitalization. Discussed at length with patient's son and daughter. (Both myself and palliative care consult discussed at length with daughter at bedside and patient's son on speaker phone) Both son and daughter was concerned that patient has shown improvement to baseline after being treated during multiple previous hospitalization. However since his last hospitalization one month back patient has not improved to his baseline. They are yet not prepared to give up and start home hospice for the patient and would like to have some time to see if patient will recover back to his normal mental and physical functioning status. They also request some additional help at home (nursing aide, PT and social work) to provide best care to their father. They are looking at about 30 days to see if [name] will show any remarkable improvement. Otherwise they are open to home hospice.   Code Status: DO NOT RESUSCITATE  Family Communication: Spoke with son and daughter Disposition Plan: Home with home health   Consultants:  Palliative care  Procedures:  Head CT  Antibiotics:  IV Rocephin 7/15-7/18  Oral Bactrim 7/18-7/22    Discharge Exam: Filed Vitals:   06/03/15 1417  BP:  134/73  Pulse: 54  Temp: 98.2 F (36.8 C)  Resp: 18    General: Elderly male lying in bed, cachectic, responds with eye opening occasional smile HEENT: Temporal wasting, poor dentition, moist mucosa  chest: Clear to auscultation bilaterally CVS: Normal S1 and S2, no murmurs rub or gallop GI: Soft, nondistended, nontender, bowel sounds present, Foley in place Musculoskeletal: Noninfected sacral decubitus ulcers CNS: Nonverbal, awake to commands with random head movements and smiling  Discharge Instructions    Current Discharge Medication List    START taking these medications   Details  sulfamethoxazole-trimethoprim (BACTRIM) 400-80 MG per tablet Take 1 tablet by mouth 2 (two) times daily. Qty: 10 tablet, Refills: 0      CONTINUE these medications which have NOT CHANGED   Details  acetaminophen (TYLENOL) 500 MG tablet Take 1,000 mg by mouth 3 (three) times daily.     AMBULATORY NON FORMULARY MEDICATION Pressure Air Mattress Dx: G60.8, L89.150 Qty: 1 each, Refills: 0    bacitracin ointment Apply 1 application topically 2 (two) times daily. Applies to head of penis to decrease infection from catheter.    Calcium Carbonate-Vit D-Min (CALTRATE 600+D PLUS) 600-800 MG-UNIT CHEW Chew 1 tablet by mouth 2 (two) times daily with a meal.     CRANBERRY SOFT PO Take 2 capsules by mouth 3 (three) times daily.     Cyanocobalamin (VITAMIN B-12) 5000 MCG SUBL Place 5,000 mcg under the tongue 2 (two) times daily.    ferrous sulfate 325 (65 FE) MG tablet Take 325 mg by mouth every other day.     hydrALAZINE (APRESOLINE) 25 MG tablet Take 1/2 tablet by mouth twice daily to control blood pressure Qty: 30 tablet, Refills: 6    lactose free nutrition (BOOST) LIQD Take 90 mLs by mouth 3 (three) times daily between meals. Refills: 0    levothyroxine (SYNTHROID, LEVOTHROID) 25 MCG tablet TAKE 1 TABLET BY MOUTH DAILY Qty: 90 tablet, Refills: 1    lidocaine (LIDODERM) 5 % PLACE 3 PATCHES  ONTO THE SKIN DAILY. REMOVE & DISCARD PATCH WITHIN 12 HOURS OR AS DIRECTED BY MD Qty: 90 patch, Refills: 1    memantine (NAMENDA) 10 MG tablet Take 10 mg by mouth 2 (two) times daily.    Multiple Vitamins-Minerals (ICAPS AREDS FORMULA PO) Take 2 capsules by mouth 2 (two) times daily with a meal.     NON FORMULARY D-Mannose, Take 2 teaspoon by mouth with fluids 2 times a day    pantoprazole (PROTONIX) 40 MG tablet Take 1 tablet (40 mg total) by mouth daily. Qty: 30 tablet, Refills: 0    polyethylene glycol (MIRALAX / GLYCOLAX) packet Take 17 g by mouth daily as needed for mild constipation.    Probiotic Product ([place]) CAPS Take 1 capsule by mouth 2 (two) times daily.    psyllium (METAMUCIL) 58.6 % packet Take 1 packet by mouth 2 (two) times daily.     VOLTAREN 1 % GEL Apply 2 g topically 4 (four) times daily as needed (pain).       STOP taking these medications     guaiFENesin (MUCINEX) 600 MG 12 hr tablet      rivaroxaban (XARELTO) 20 MG TABS tablet        Allergies  Allergen Reactions  . Other Other (See Comments)    Dairy  products- cause swallowing difficulty    Follow-up Information    Follow up with REED, TIFFANY, DO. Call in 1 week.   Specialty:  Geriatric Medicine   Contact information:   [address] [place] [address] [phone]        The results of significant diagnostics from this hospitalization (including imaging, microbiology, ancillary and laboratory) are listed below for reference.    Significant Diagnostic Studies: Dg Chest 2 View  05/31/2015   CLINICAL DATA:  81 year old male with possible aspiration.  EXAM: CHEST  2 VIEW  COMPARISON:  Radiograph 04/22/2015  FINDINGS: Evaluation is limited due to patient's kyphotic position as well as projection of the arms over the chest on the lateral view. No definite consolidation. No significant pleural effusion. No pneumothorax. Stable cardiomegaly. Osteopenia with degenerative changes  of the spine.  IMPRESSION: No focal consolidation.   Electronically Signed   By: Anner Crete M.D.   On: 05/31/2015 23:11   Ct Head Wo Contrast  06/01/2015   CLINICAL DATA:  Altered mental status. Episode of unresponsiveness. This began about 1-1/2 hours ago. History of neurological disorder and nonverbal since gene.  EXAM: CT HEAD WITHOUT CONTRAST  TECHNIQUE: Contiguous axial images were obtained from the base of the skull through the vertex without intravenous contrast.  COMPARISON:  03/31/2015  FINDINGS: Examination is limited due to nonstandard position required because of the patient's severe kyphosis. Study is scanned in a predominantly coronal plane. Streak artifacts other fairly limits visualization.  There is evidence of diffuse cerebral atrophy with ventricular dilatation likely due to central atrophy. Low-attenuation changes in the white matter is probably due to small vessel ischemia. Low-attenuation changes appear to extend to the cortical surface in the right parietal region, likely representing old infarct. There is no evidence of significant mass effect or midline shift. No abnormal extra-axial fluid collections. No evidence of acute intracranial hemorrhage. Calvarium appears intact. Retention cysts in the sphenoid sinuses. Mastoid air cells are not opacified.  IMPRESSION: Technically limited study. Diffuse atrophy and small vessel ischemic changes. Probable old infarct in the right parietal region. No acute intracranial hemorrhage or midline shift.   Electronically Signed   By: Lucienne Capers M.D.   On: 06/01/2015 00:14   Dg Foot Complete Left  05/31/2015   CLINICAL DATA:  81 year old male with fall sore to the heel of foot  EXAM: LEFT FOOT - COMPLETE 3+ VIEW  COMPARISON:  None.  FINDINGS: No acute fracture or dislocation. There is diffuse osteopenia. The soft tissues are grossly unremarkable. No radiopaque foreign object identified.  IMPRESSION: Osteopenia. No acute fracture or  dislocation. No radiopaque foreign object.   Electronically Signed   By: Anner Crete M.D.   On: 05/31/2015 23:57    Microbiology: No results found for this or any previous visit (from the past 240 hour(s)).   Labs: Basic Metabolic Panel:  Recent Labs Lab 05/31/15 2243 06/01/15 0020 06/01/15 0523  NA 131* 132* 133*  K 4.4 4.2 4.1  CL 99* 101 100*  CO2 22  --  26  GLUCOSE 121* 103* 90  BUN 27* 35* 27*  CREATININE 1.04 1.10 0.88  CALCIUM 9.3  --  9.1   Liver Function Tests:  Recent Labs Lab 05/31/15 2243 06/01/15 0523  AST 31 26  ALT 22 23  ALKPHOS 64 60  BILITOT 0.5 0.5  PROT 7.4 7.2  ALBUMIN 3.5 3.4*   No results for input(s): LIPASE, AMYLASE in the last 168 hours. No results  for input(s): AMMONIA in the last 168 hours. CBC:  Recent Labs Lab 06/01/15 0009 06/01/15 0020 06/01/15 0523  WBC 6.2  --  5.3  NEUTROABS 4.9  --  3.3  HGB 14.0 17.0 12.6*  HCT 43.1 50.0 38.8*  MCV 89.2  --  89.2  PLT 132*  --  166   Cardiac Enzymes: No results for input(s): CKTOTAL, CKMB, CKMBINDEX, TROPONINI in the last 168 hours. BNP: BNP (last 3 results) No results for input(s): BNP in the last 8760 hours.  ProBNP (last 3 results) No results for input(s): PROBNP in the last 8760 hours.  CBG:  Recent Labs Lab 05/31/15 2158  GLUCAP 132*       Signed:  Anice Wilshire  [facility] 06/03/2015, 3:23 PM

## 2015-06-03 NOTE — Care Management Note (Signed)
Case Management Note  Patient Details  Name: Ricky [name] MRN: [id number] Date of Birth: May 24, 1917  Subjective/Objective:          UTI         Action/Plan: Home Health   Expected Discharge Date:  06/03/2015              Expected Discharge Plan:  Home Health  In-House Referral:     Discharge planning Services  CM Consult  Post Acute Care Choice:  Hospice Choice offered to:  Adult Children  DME Arranged:    DME Agency:     HH Arranged:  RN, PT, aide, Social Worker [place] Agency:  [facility]  Status of Service:  complete    Medicare Important Message Given:    Date Medicare IM Given:    Medicare IM give by:    Date Additional Medicare IM Given:    Additional Medicare Important Message give by:     If discussed at [place] of Stay Meetings, dates discussed:    Additional Comments: 06/03/2015 1530 NCM spoke to dtr, and son, [name]. They want to take pt home with [facility] and decide in 30 days on Hospice. They will follow up with his PCP for Hospice orders at home. Pt is active with [facility] per dtr. Contacted [facility] and spoke to rep for resumption of care. They will resume HH. Pt has hospital bed, bedside commode, wheelchair and transport chair at home.   NCM spoke to dtr, [name], # [phone]. States she is undecided on Hospice at this time. Her brother, [name], [name], RN 06/03/2015, 2:51 PM

## 2015-06-04 NOTE — Progress Notes (Signed)
Patient given discharge instructions, and verbalized an understanding of all discharge instructions.  Patient agrees with discharge plan, and is being discharged in stable medical condition.  Patient given transportation via wheelchair. 

## 2015-06-04 NOTE — Progress Notes (Signed)
Patient seen this morning.  vitals stable. No overnight events.  family at bedside. summarized our discussions from yesterday again and they understand.  stable for d/c home.home health ordered.

## 2015-06-12 ENCOUNTER — Telehealth: Payer: Self-pay | Admitting: *Deleted

## 2015-06-12 NOTE — Telephone Encounter (Signed)
Received Ricky [name] [phone] Supply Order Form for patient's Foam Dressing Wound Covers.  Given to Dr. Mariea Clonts to review and sign and to be faxed back to Fax:[phone]

## 2015-06-15 ENCOUNTER — Encounter: Payer: Self-pay | Admitting: Internal Medicine

## 2015-06-15 ENCOUNTER — Ambulatory Visit (INDEPENDENT_AMBULATORY_CARE_PROVIDER_SITE_OTHER): Payer: Medicare Other | Admitting: Internal Medicine

## 2015-06-15 VITALS — BP 130/82 | HR 60 | Temp 97.4°F | Resp 12

## 2015-06-15 DIAGNOSIS — E43 Unspecified severe protein-calorie malnutrition: Secondary | ICD-10-CM | POA: Diagnosis not present

## 2015-06-15 DIAGNOSIS — L8990 Pressure ulcer of unspecified site, unspecified stage: Secondary | ICD-10-CM | POA: Diagnosis not present

## 2015-06-15 DIAGNOSIS — R627 Adult failure to thrive: Secondary | ICD-10-CM | POA: Diagnosis not present

## 2015-06-15 DIAGNOSIS — G231 Progressive supranuclear ophthalmoplegia [Steele-Richardson-Olszewski]: Secondary | ICD-10-CM | POA: Diagnosis not present

## 2015-06-15 DIAGNOSIS — R339 Retention of urine, unspecified: Secondary | ICD-10-CM

## 2015-06-15 DIAGNOSIS — M62422 Contracture of muscle, left upper arm: Secondary | ICD-10-CM | POA: Diagnosis not present

## 2015-06-15 NOTE — Progress Notes (Signed)
Patient ID: Ricky [name], male   DOB: [birth year], 81 y.o.   MRN: [id number]   Location:  [facility]  Code Status: DNR Goals of Care: Advanced Directive information Does patient have an advance directive?: Yes, Type of Advance Directive: [place];Out of facility DNR (pink MOST or yellow form), Pre-existing out of facility DNR order (yellow form or pink MOST form): Pink MOST form placed in chart (order not valid for inpatient use), Does patient want to make changes to advanced directive?: No - Patient declined  Chief Complaint  Patient presents with  . Hospitalization Follow-up    Follow-up from 05/31/15 hospital visit   . Medication Management    Discuss the need for protonix, if to continue patient needs rx     HPI: Patient is a 81 y.o.  seen in the office today for hospital f/u from 7/14-18 with encephalopathy felt to be due to UTI vs. his PSP.   He'd gone out due to drooling after meals--his son believes her regurgitated due to boost interacting with other foods--came up out of his nose.  Son took vitals, bp low, oxygen fine, but his mentation was abnormal and he had a left facial droop.  He was treated with bactrim for 7 days and empiric rocephin continued.    Biggest change is not talking anymore over the last 2 mos for the most part.  He did say hello this am when I spoke with him. Plan is if no improvement over 30-45 days, they would move toward hospice.  1.  Not eating and drinking as much.  2.  Loss of emotion--not laughing and smiling.  3.  Ability to eat solid food significantly diminished.  (was d/c'd home on pureed diet, but his son is giving him 5-6 boosts per day, occasional mechanically soft finely chopped food when he's alert enough and accepting food).  He won't eat the pureed food.  His son is getting up and giving him fluid sometimes twice at night.  4.  He denies pain.  Son says he does not seem to respond to  painful stimulating like contracture of arm movement.  Denies pain.  His son still thinks he has improved since the hospitalization.  Bad night 450cc urine.  Average night 850-1000cc urine.  Good night 1500cc.    His 4 wounds are under control.  [facility] and [facility] requested the previous one return.  They got her back so he feels comfortable with the wounds.  It's hard to offload him.  Left shoulder, sacrum, right knee and inside of left heel (that one is nearly gone).  Left heel started as blood blister, he says.  His daughter was messing with it and then it opened.  Saw Dr. Eulas Post the next day.  Put duoderm on it and that helped.  Two similar areas now on outside of right foot.   He was sent home with a 30 day course of protonix.  He has not been belching or having s/s of indigestion.    Left arm contracture has gotten much worse.  [employer] felt he couldn't wear two splints.  He was going to look into a combination splint which would have to be custom made.  This is still pending.    Review of Systems: obtained from his son Review of Systems  Constitutional: Positive for weight loss and malaise/fatigue. Negative for fever and chills.  Respiratory: Negative  for shortness of breath.   Cardiovascular: Negative for chest pain.  Gastrointestinal: Positive for constipation. Negative for abdominal pain.       Does well with prunes  Genitourinary:       Has indwelling catheter with leg bag for urinary retention  Musculoskeletal: Negative for myalgias and falls.       Contractures of all extremities, left arm is worst  Skin: Negative for rash.       4 pressure ulcers  Neurological: Positive for weakness. Negative for loss of consciousness.  Psychiatric/Behavioral: Positive for memory loss.    Past Medical History  Diagnosis Date  . IBS (irritable bowel syndrome)   . Diverticulosis   . Cataract   . OAB (overactive bladder)   . Macular degeneration   .  Hypertension   . Parkinsonian syndrome   . Syncope, vasovagal   . 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 Unspecified transient cerebral ischemia   . Unspecified urinary incontinence   . Hypothyroidism   . Anemia, unspecified   . Depression   . Alzheimer's disease   . Gout, unspecified   . Thoracic or lumbosacral neuritis or radiculitis, unspecified   . Progressive supranuclear palsy   . Paralysis agitans   . Pneumonitis due to inhalation of food or vomitus   . Cellulitis and abscess of oral soft tissues   . Unspecified hypothyroidism   . Pneumonia, organism unspecified   . Pseudomonas infection in conditions classified elsewhere and of unspecified site   . Urinary tract infection, site not specified   . Osteoarthrosis, unspecified whether generalized or localized, unspecified site   . Osteoporosis, unspecified   . Gout, unspecified   . Thoracic or lumbosacral neuritis or radiculitis, unspecified   . Stroke     Past Surgical History  Procedure Laterality Date  . Esophagogastroduodenoscopy    . Eye surgery  2006    LEFT CATARACT  . Esophagogastroduodenoscopy N/A 04/24/2015    Procedure: ESOPHAGOGASTRODUODENOSCOPY (EGD);  Surgeon: Carol Ada, MD;  Location: [facility] ENDOSCOPY;  Service: Endoscopy;  Laterality: N/A;    Allergies  Allergen Reactions  . Other Other (See Comments)    Dairy products- cause swallowing difficulty    Medications: Patient's Medications  New Prescriptions   No medications on file  Previous Medications   ACETAMINOPHEN (TYLENOL) 500 MG TABLET    Take 1,000 mg by mouth 3 (three) times daily.    AMBULATORY NON FORMULARY MEDICATION    Pressure Air Mattress Dx: G60.8, L89.150   BACITRACIN OINTMENT    Apply 1 application topically 2 (two) times daily. Applies to head of penis to decrease infection from catheter.   CALCIUM CARBONATE-VIT D-MIN (CALTRATE 600+D PLUS) 600-800 MG-UNIT CHEW    Chew 1 tablet by mouth 2 (two) times  daily with a meal.    CRANBERRY SOFT PO    Take 2 capsules by mouth 3 (three) times daily.    CYANOCOBALAMIN (VITAMIN B-12) 5000 MCG SUBL    Place 5,000 mcg under the tongue 2 (two) times daily.   FERROUS SULFATE 325 (65 FE) MG TABLET    Take 325 mg by mouth every other day.    HYDRALAZINE (APRESOLINE) 25 MG TABLET    Take 1/2 tablet by mouth twice daily to control blood pressure   LACTOSE FREE NUTRITION (BOOST) LIQD    Take 90 mLs by mouth 3 (three) times daily between meals.   LEVOTHYROXINE (SYNTHROID, LEVOTHROID) 25 MCG TABLET    TAKE 1 TABLET  BY MOUTH DAILY   LIDOCAINE (LIDODERM) 5 %    PLACE 3 PATCHES ONTO THE SKIN DAILY. REMOVE & DISCARD PATCH WITHIN 12 HOURS OR AS DIRECTED BY MD   MEMANTINE (NAMENDA) 10 MG TABLET    Take 10 mg by mouth 2 (two) times daily.   MULTIPLE VITAMINS-MINERALS (ICAPS AREDS FORMULA PO)    Take 2 capsules by mouth 2 (two) times daily with a meal.    NON FORMULARY    D-Mannose, Take 2 teaspoon by mouth with fluids 2 times a day   POLYETHYLENE GLYCOL (MIRALAX / GLYCOLAX) PACKET    Take 17 g by mouth daily as needed for mild constipation.   PROBIOTIC PRODUCT (PHILLIPS COLON HEALTH) CAPS    Take 1 capsule by mouth 2 (two) times daily.   PSYLLIUM (METAMUCIL) 58.6 % PACKET    Take 1 packet by mouth 2 (two) times daily.    VOLTAREN 1 % GEL    Apply 2 g topically 4 (four) times daily as needed (pain).   Modified Medications   No medications on file  Discontinued Medications   PANTOPRAZOLE (PROTONIX) 40 MG TABLET    Take 1 tablet (40 mg total) by mouth daily.    Physical Exam: Filed Vitals:   06/15/15 0801  BP: 130/82  Pulse: 60  Temp: 97.4 F (36.3 C)  TempSrc: Oral  Resp: 12  SpO2: 97%   Physical Exam  Constitutional:  Frail black male seated in wheelchair  Eyes:  nystagmus  Cardiovascular: Normal rate, regular rhythm and normal heart sounds.   Pulmonary/Chest: Effort normal and breath sounds normal.  Abdominal: Soft. Bowel sounds are normal.    Genitourinary:  Catheter with leg bag with yellow urine  Musculoskeletal:  Contractures of all 4 extremities especially left arm  Neurological:  Did answer a few simple yes and no questions and said he was hungry  Skin:  4 pressure ulcers plus deep tissue injury of right foot  Psychiatric:  Flat affect    Labs reviewed: Basic Metabolic Panel:  Recent Labs  01/11/15 0530 01/11/15 0710  03/25/15 1720  04/23/15 0534 04/24/15 0533 04/25/15 0459 05/31/15 2243 06/01/15 0020 06/01/15 0523  NA 129*  --   < >  --   < > 136 139 135 131* 132* 133*  K 4.2  --   < >  --   < > 3.6 3.4* 3.8 4.4 4.2 4.1  CL 97  --   < >  --   < > 102 104 101 99* 101 100*  CO2 25  --   < >  --   < > 25 23 23 22   --  26  GLUCOSE 84  --   < >  --   < > 68 64* 54* 121* 103* 90  BUN 11  --   < >  --   < > 9 7 9  27* 35* 27*  CREATININE 0.67  --   < >  --   < > 0.65 0.68 0.76 1.04 1.10 0.88  CALCIUM 8.4  --   < >  --   < > 8.4* 8.5* 8.4* 9.3  --  9.1  MG 1.9  --   --   --   --  1.6* 1.9  --   --   --   --   PHOS 3.2  --   --   --   --  3.3  --   --   --   --   --  TSH  --  0.842  --  1.228  --  1.352  --   --   --   --   --   < > = values in this interval not displayed. Liver Function Tests:  Recent Labs  04/23/15 0534 05/31/15 2243 06/01/15 0523  AST 35 31 26  ALT 18 22 23   ALKPHOS 49 64 60  BILITOT 1.7* 0.5 0.5  PROT 6.1* 7.4 7.2  ALBUMIN 2.7* 3.5 3.4*   No results for input(s): LIPASE, AMYLASE in the last 8760 hours. No results for input(s): AMMONIA in the last 8760 hours. CBC:  Recent Labs  04/25/15 0459 04/27/15 1144 06/01/15 0009 06/01/15 0020 06/01/15 0523  WBC 5.4 6.5 6.2  --  5.3  NEUTROABS  --  5.1 4.9  --  3.3  HGB 10.6*  --  14.0 17.0 12.6*  HCT 32.4* 36.6* 43.1 50.0 38.8*  MCV 85.9  --  89.2  --  89.2  PLT 257  --  132*  --  166   Lipid Panel: No results for input(s): CHOL, HDL, LDLCALC, TRIG, CHOLHDL, LDLDIRECT in the last 8760 hours. Lab Results  Component Value  Date   HGBA1C 5.3 06/21/2012   Assessment/Plan 1. Progressive supranuclear ophthalmoplegia or palsy -is progressing -he certainly qualifies for hospice care--his children would like to wait 30-45 days to see if he improves in terms of the 4 parameters in hpi  -if qol remains poor, will refer to hospice  2. FTT (failure to thrive) in adult -continues to progress -he is eating very little now and primarily living off of boost--he won't eat pureed food so his son is still giving some finely chopped foods despite aspiration risk  3. Urinary retention with incomplete bladder emptying -foley catheter in place chronically and has chronic colonization so each time he's hospitalized with the slightest change in mentation related to his PSP, he is treated with abx for a UTI  4. Protein-calorie malnutrition, severe -is progressing as well -he has become dependent on boost for survival at this point  5. Pressure ulcer -has 4 of these being managed by the home health RN and his son is repositioning him as best he can--wounds are on both sides of his body and he has contractures and cannot reposition himself  6. 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 Journe Hallmark, D.O. [facility] [address], [place] [address] Cell Phone (Mon-Fri 8am-5pm):  [phone] On Call:  [phone] & follow prompts after 5pm & weekends Office Phone:  [phone] Office Fax:  [phone]

## 2015-06-19 ENCOUNTER — Ambulatory Visit (INDEPENDENT_AMBULATORY_CARE_PROVIDER_SITE_OTHER): Payer: Medicare Other | Admitting: Neurology

## 2015-06-19 ENCOUNTER — Encounter: Payer: Self-pay | Admitting: Neurology

## 2015-06-19 VITALS — BP 142/70 | HR 72 | Resp 14

## 2015-06-19 DIAGNOSIS — R5381 Other malaise: Secondary | ICD-10-CM | POA: Diagnosis not present

## 2015-06-19 DIAGNOSIS — R54 Age-related physical debility: Secondary | ICD-10-CM

## 2015-06-19 DIAGNOSIS — Z87898 Personal history of other specified conditions: Secondary | ICD-10-CM | POA: Diagnosis not present

## 2015-06-19 DIAGNOSIS — N390 Urinary tract infection, site not specified: Secondary | ICD-10-CM | POA: Diagnosis not present

## 2015-06-19 DIAGNOSIS — G231 Progressive supranuclear ophthalmoplegia [Steele-Richardson-Olszewski]: Secondary | ICD-10-CM | POA: Diagnosis not present

## 2015-06-19 DIAGNOSIS — Z9289 Personal history of other medical treatment: Secondary | ICD-10-CM

## 2015-06-19 NOTE — Progress Notes (Signed)
Subjective:    Patient ID: Ricky [name] is a 81 y.o. male.  HPI     Interim history:   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 He is accompanied by his son, [name], again today. I last saw him on 11/22/2014, at which time his children felt that he was doing reasonably well. His son reported that he was doing the best he had done and probably a whole year. He had not been hospitalized in 4 consecutive months which was actually very good. His last admission was secondary to a GI bleed at the time which was deemed secondary to using Mobic. His posture and mobility had gradually continued to get worse. They were able to streamline a lot of his medications. They were in the process of pursuing palliative care.  In the interim, he had several hospitalizations, namely on 01/10/2015 through 01/14/2015 secondary to hematuria and UTI, from 03/25/15 to 03/30/15 for UTI, AMS and shingles, readmission on 03/31/2015 through 04/13/2015, secondary to altered mental status, from 04/22/2015 through 04/25/2015, secondary to GI bleed, resulting in dehydration, and encephalopathy, and most recently, on 05/31/2015 through 06/04/15 secondary to altered mental status secondary to UTI.   Today, 06/19/2015: [name] provides the history and since the last D/C on 06/04/15, he has stopped eating solids and stopped talking. They have a nurse come in 2 times a week to help bathe him. Things have changed in the last 30 days, he is more withdrawn, minimally interactive.   Previously:  I saw him on 05/26/2014, at which time his son reported that the patient had been accepted to [facility]. They did not pursue a G-tube. He had developed a decubitus ulcer on his tailbone which has since  then healed. Dr. Mariea Clonts is his primary care physician and he just saw her on 10/19/14.   I saw him on 01/04/2014, at which time I felt he had advanced PSP. I agreed that we could try him on neupro patch again at the request of his son. I continued him on Namenda but I was sure to explain to the patient and his son that given his advanced age and the atypical parkinsonism he would be more at risk for side effects. I suggested initiating physical therapy outpatient again. I felt he needed 24-7 supervision.  He was admitted to the hospital several times: On 01/15/14 he was at [facility] for syncope d/t perhaps constipation. He was D/C on 01/17/14, he was admitted on 03/08/14 for fall with abrasion to forehead, was treated for UTI and URI, which was found at the time, D/C on 03/13/14, he was admitted to [facility] on 03/20/14, d/t UTI and aspiration pneumonia and D/C on 03/28/14 and went to [place] place for 3 days, but admission was denied by his insurance and then went to [facility] ER from there, not admitted and again went to the [facility] ER on 04/12/14 for not eating and drinking well, was dehydrated and D/C to home.   I saw him on 07/02/2013, at which time I did not start him on any medications. He had tried Sinemet, Neupro and Aricept in the past. I continued him on Namenda.   He was seen in the emergency room several times. On 07/12/2013 he presented to the ER after a syncopal spell. Workup included EKG and urinalysis. He did not  need further admission or treatment. On 09/09/2013 he presented to the emergency room not feeling well and complaining of back pain. An x-ray of the lumbar spine showed an L1 anterior wedge compression fracture of unknown age. He did not need further treatment or admission. On 10/18/2013 he was admitted to the hospital for one day for chest pain. Workup showed no acute coronary event, but he was felt to have atypical chest pain from GERD. They started protonix, he saw cardiology afterwards. The protonix was stopped  after about 6 weeks. He was taken back to the ER on 10/20/14 d/t hematuria and eventually this stopped after changing the foley. He also tested positive for C. Diff and treated with Flagyl, which did not help and on 10/31/13 he was placed on PO Vanc pills from 15th to 18th, then liquid for another 8 days, by Dr. Harrington Challenger, his cardiologist. He has had C. Diff 2 times before, per [name].   [name] (who provides the entire Hx) reports, that patient had no been "right" since 9/14 off and on. He had elevated temperature off and on, up to 99.1, which is up from his baseline of around 97.1. In the last few weeks, he has been better, more closer to his baseline. [name] walks him about 7 times a day with maximum assistance by holding both is hands. In 1/13, he was using a walker and was supposed to have PT, but was not able to follow commands at the time and in hindsight, he had a smoldering infection. He has overall become stiffer and slower. He is on cranberry juice and tablets, and D-Mannose prn for UTI. He seems to have small incidences with aspiration. He has lately been eating better. He had some teeth pulled. His liquids are thickened and his food is finely chopped. There is a nurse that comes in every day around 8:15.   Looking back, [name] reports that when patient was on Neupro patch he was a little bit better. [name] did not notice any side effects.    I first met him on 04/01/2013 at which time I felt that the constellation of parkinsonism, memory loss, gait dysfunction, balance issues, recurrent falls and syncopal spells as well as a history of recurrent urinary tract infections most likely pointed towards a diagnosis of PSP (progressive supranuclear palsy). He previously followed with Dr. Morene Antu and was last seen by him on 12/03/2012. He has a history of blackout spells for the past 5 years as well as memory loss. The first episode of loss of consciousness was noted in March 2010 at which time his son found  him down on the floor rigid with his head flexed and his right hand trembling. He was admitted to the hospital for 3 days and diagnosed with vasovagal syncope and had workup including chest x-ray (cardiomegaly), head CT without contrast (chronic small vessel disease), MRI brain without contrast (no acute abnormality and chronic small vessel disease), MRA head (normal), echocardiogram (EF of 60%). He had pneumonia, complicated by a PE in 9/35 and repeat MRI brain in 1/11 (generalized atrophy) and MRA head (mild stenosis of the L MCA). He was tried on Aricept (stopped d/t bradycardia) and Sinemet (discontinued). He has had recurrent pneumonia and recurrent UTIs and had a Foley catheter placed. He was at [facility] last in April 2013. He developed pressure ulcers on his feet. He has a CNA daily to help him bathe and dress and he has supervision 24-7. He was taken off of Neupro patch  a hospitalization for UTI and his mental functioning improved.   He was admitted to the hospital with fever and altered mental status on 06/08/2013 and discharged on 06/17/2013. He was treated for a urinary tract infection with IV vancomycin and had an LP with CSF cultures negative. He has become WC bound.    His Past Medical History Is Significant For: Past Medical History  Diagnosis Date  . IBS (irritable bowel syndrome)   . Diverticulosis   . Cataract   . OAB (overactive bladder)   . Macular degeneration   . Hypertension   . Parkinsonian syndrome   . Syncope, vasovagal   . 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 Unspecified transient cerebral ischemia   . Unspecified urinary incontinence   . Hypothyroidism   . Anemia, unspecified   . Depression   . Alzheimer's disease   . Gout, unspecified   . Thoracic or lumbosacral neuritis or radiculitis, unspecified   . Progressive supranuclear palsy   . Paralysis agitans   . Pneumonitis due to inhalation of food or vomitus   . Cellulitis and  abscess of oral soft tissues   . Unspecified hypothyroidism   . Pneumonia, organism unspecified   . Pseudomonas infection in conditions classified elsewhere and of unspecified site   . Urinary tract infection, site not specified   . Osteoarthrosis, unspecified whether generalized or localized, unspecified site   . Osteoporosis, unspecified   . Gout, unspecified   . Thoracic or lumbosacral neuritis or radiculitis, unspecified   . Stroke     His Past Surgical History Is Significant For: Past Surgical History  Procedure Laterality Date  . Esophagogastroduodenoscopy    . Eye surgery  2006    LEFT CATARACT  . Esophagogastroduodenoscopy N/A 04/24/2015    Procedure: ESOPHAGOGASTRODUODENOSCOPY (EGD);  Surgeon: Carol Ada, MD;  Location: [facility] ENDOSCOPY;  Service: Endoscopy;  Laterality: N/A;    His Family History Is Significant For: Family History  Problem Relation Age of Onset  . Cancer Mother   . Stroke Father   . Liver disease Father   . Kidney disease Brother   . Liver disease Brother   . Emphysema Brother     His Social History Is Significant For: History   Social History  . Marital Status: Single    Spouse Name: N/A  . Number of Children: N/A  . Years of Education: N/A   Social History Main Topics  . Smoking status: Former Research scientist (life sciences)  . Smokeless tobacco: Never Used     Comment: QUIT SMOKING BACK IN THE LATE 50'S  . Alcohol Use: No  . Drug Use: No  . Sexual Activity: No   Other Topics Concern  . None   Social History Narrative    His Allergies Are:  Allergies  Allergen Reactions  . Other Other (See Comments)    Dairy products- cause swallowing difficulty   :   His Current Medications Are:  Outpatient Encounter Prescriptions as of 06/19/2015  Medication Sig  . acetaminophen (TYLENOL) 500 MG tablet Take 1,000 mg by mouth 3 (three) times daily.   . AMBULATORY NON FORMULARY MEDICATION Pressure Air Mattress Dx: G60.8, L89.150  . bacitracin ointment Apply 1  application topically 2 (two) times daily. Applies to head of penis to decrease infection from catheter.  . Calcium Carbonate-Vit D-Min (CALTRATE 600+D PLUS) 600-800 MG-UNIT CHEW Chew 1 tablet by mouth 2 (two) times daily with a meal.   . CRANBERRY SOFT PO Take 2 capsules by  mouth 3 (three) times daily.   . Cyanocobalamin (VITAMIN B-12) 5000 MCG SUBL Place 5,000 mcg under the tongue 2 (two) times daily.  . ferrous sulfate 325 (65 FE) MG tablet Take 325 mg by mouth every other day.   . hydrALAZINE (APRESOLINE) 25 MG tablet Take 1/2 tablet by mouth twice daily to control blood pressure (Patient taking differently: Take 12.5 mg by mouth 2 (two) times daily. Take 1/2 tablet by mouth twice daily to control blood pressure)  . lactose free nutrition (BOOST) LIQD Take 90 mLs by mouth 3 (three) times daily between meals. (Patient taking differently: 90 mLs. Takes 6 x daily)  . levothyroxine (SYNTHROID, LEVOTHROID) 25 MCG tablet TAKE 1 TABLET BY MOUTH DAILY (Patient taking differently: TAKE 25MCG BY MOUTH DAILY)  . lidocaine (LIDODERM) 5 % PLACE 3 PATCHES ONTO THE SKIN DAILY. REMOVE & DISCARD PATCH WITHIN 12 HOURS OR AS DIRECTED BY MD (Patient taking differently: PLACE 1-3 PATCHES ONTO THE SKIN DAILY AS NEEDED FOR PAIN. REMOVE & DISCARD WITHIN 12 HOURS)  . memantine (NAMENDA) 10 MG tablet Take 10 mg by mouth 2 (two) times daily.  . Multiple Vitamins-Minerals (ICAPS AREDS FORMULA PO) Take 2 capsules by mouth 2 (two) times daily with a meal.   . NON FORMULARY D-Mannose, Take 2 teaspoon by mouth with fluids 2 times a day  . polyethylene glycol (MIRALAX / GLYCOLAX) packet Take 17 g by mouth daily as needed for mild constipation.  . Probiotic Product ([place]) CAPS Take 1 capsule by mouth 2 (two) times daily.  . psyllium (METAMUCIL) 58.6 % packet Take 1 packet by mouth 2 (two) times daily.   . VOLTAREN 1 % GEL Apply 2 g topically 4 (four) times daily as needed (pain).    No facility-administered  encounter medications on file as of 06/19/2015.  :  Review of Systems:  Out of a complete 14 point review of systems, all are reviewed and negative with the exception of these symptoms as listed below:   Review of Systems  Neurological:       Son reports that patient is no longer talking, states that patient has no pain response. Patient has stopped eating, consuming some Ensure. Contractions in arms and hands have increased since last seen here.     Objective:  Neurologic Exam  Physical Exam Physical Examination:   Filed Vitals:   06/19/15 1310  BP: 142/70  Pulse: 72  Resp: 14   General Examination: The patient is a very pleasant 81 y.o. male in no acute distress. He is frail and minimally verbal, but is actually more responsive and awake today, able to answer and short sentences. He is in his WC and severely stooped and leaning to the L.  HEENT: Normocephalic, atraumatic, pupils are equal, round and reactive to light and accommodation. Extraocular tracking shows severe saccadic breakdown without nystagmus noted. There is severe limitation to entire gaze. There is moderate decrease in eye blink rate and he keeps his eyes closed most of the time but is able to open them on verbal command. Hearing is mildly impaired. Face is symmetric with moderate facial masking and normal facial sensation. There is no lip, neck or jaw tremor. Neck is severely rigid with decreased passive ROM and severe anterocollis and L laterocollis. There are no carotid bruits on auscultation. Oropharynx exam reveals  mild mouth dryness and partially edentulous state. No significant airway crowding is noted. Mallampati is class III. Tongue protrudes centrally and palate elevates symmetrically. There is  no drooling.   Chest: is clear to auscultation without wheezing, rhonchi or crackles noted.  Heart: sounds are regular and normal without murmurs, rubs or gallops noted.   Abdomen: is soft, non-tender and non-distended  with normal bowel sounds appreciated on auscultation.  Extremities: There is no pitting edema in the distal lower extremities bilaterally.   Skin: is warm and dry with no trophic changes noted. Age-related changes are noted on the skin.   Musculoskeletal: exam reveals no obvious joint deformities, except severe stiffness, concern for contractures in both UEs.   Neurologically:  Mental status: The patient is awake and not fully alert, very little attention. He is unable to provide the history. His son provides the entire history. He is oriented to: self, place and situation. His memory, attention, language and knowledge are impaired.  On 11/22/2014: MMSE: 6/30. On 06/19/2015: MMSE: 0/30.  He is essentially non-verbal today. Speech: he did say a few things: "yes" and "lima beans and rice". He is severely dysarthric.   Cranial nerves are as described above under HEENT exam.   Motor exam:  thin bulk, and global strength of 4/5 is noted. There are no dyskinesias noted.   Tone is severely rigid with absence of cogwheeling. There is overall severe bradykinesia. There is no tremor.  Reflexes are trace in the upper extremities and trace in the lower extremities. Fine motor skills are severely impaired and essentially not testable.   Cerebellar testing is not possible.   Sensory exam is intact to light touch, as he responds to touch.   Gait, station and balance: we did not stand or walk him today, except for brief standing with maximum assistance, during which time he his severely stiff and stands on his toes with knees and feet and hips flexed, cannot stand without maximum assistance and leaning onto his son.    Assessment and Plan:   In summary, CARRSON LIGHTCAP is a very pleasant 81 year old male with a history of advanced PSP, complicated by recurrent syncopal spells, memory loss, gait dysfunction, balance issues, recurrent falls, recurrent urinary tract infections and recurrent C. difficile  colitis and URIs, numerous hospitalizations and complications from deconditioning and overall frailty and ageing. I agree with the plan of his PCP and his son to pursue palliative care. Things have progressively become worse and unfortunately, there is not much more we can do other than supportive care. I will see him back as needed.  I spent 30 minutes in total face-to-face time with the patient, more than 50% of which was spent in counseling and coordination of care, reviewing test results, reviewing medication and discussing or reviewing the diagnosis of PSP, its prognosis and treatment options.

## 2015-06-19 NOTE — Patient Instructions (Signed)
I agree with your plan and Dr. Cyndi Lennert plan to pursue palliative care with your dad.

## 2015-06-26 ENCOUNTER — Other Ambulatory Visit: Payer: Self-pay | Admitting: Internal Medicine

## 2015-06-26 DIAGNOSIS — L89154 Pressure ulcer of sacral region, stage 4: Secondary | ICD-10-CM | POA: Diagnosis not present

## 2015-06-26 DIAGNOSIS — R404 Transient alteration of awareness: Secondary | ICD-10-CM | POA: Diagnosis not present

## 2015-06-26 DIAGNOSIS — R339 Retention of urine, unspecified: Secondary | ICD-10-CM | POA: Diagnosis not present

## 2015-06-26 DIAGNOSIS — Z466 Encounter for fitting and adjustment of urinary device: Secondary | ICD-10-CM | POA: Diagnosis not present

## 2015-06-29 ENCOUNTER — Telehealth: Payer: Self-pay

## 2015-06-29 NOTE — Telephone Encounter (Signed)
Message on triage voice mail from [place] with [facility], was verbal order to continue wound care for shoulder and buttock. Called back left message [phone] giving the ok to continue care for wound care.

## 2015-07-05 ENCOUNTER — Telehealth: Payer: Self-pay | Admitting: *Deleted

## 2015-07-05 NOTE — Telephone Encounter (Signed)
Heather with [facility] called and stated that patient has had decrease oral intake for the last couple of days along with decrease urination output. Vitals are stable with BP 130/70 and heart rate 76. Any suggestion to prevent Hospitalization? Please Advise.

## 2015-07-05 NOTE — Telephone Encounter (Signed)
Left message with Nira Conn with [facility] voicemail stating the message from Dr. Mariea Clonts. I told her to call to confirm she received the message.

## 2015-07-05 NOTE — Telephone Encounter (Signed)
We could administer one liter of IVFs, but this would only be short term and not repeated b/c it will not benefit his disease process long term.  More than likely, he is not eating due to his progressive supranuclear palsy which has indeed been gradually progressing.

## 2015-07-10 ENCOUNTER — Telehealth: Payer: Self-pay | Admitting: *Deleted

## 2015-07-10 NOTE — Telephone Encounter (Signed)
[name], daughter called and stated that [facility] did not want to cover patient's air mattress due to 04/27/15 OV note stating nothing about pressure ulcers. She stated we had to call [phone] and do a Peer to Peer Review. I called the number and spoke with Tamika and she looked up patient and gave me a Reference #: [id number] and another number to call to do the Peer to Peer and stated that I would need to speak with Colletta Maryland at # [phone]. I called this number and had to leave a message for Colletta Maryland to return call.

## 2015-07-15 ENCOUNTER — Other Ambulatory Visit: Payer: Self-pay | Admitting: Internal Medicine

## 2015-07-30 ENCOUNTER — Ambulatory Visit: Payer: Medicare Other | Admitting: Internal Medicine

## 2015-07-30 ENCOUNTER — Telehealth: Payer: Self-pay | Admitting: *Deleted

## 2015-07-30 ENCOUNTER — Other Ambulatory Visit: Payer: Medicare Other

## 2015-07-30 DIAGNOSIS — R195 Other fecal abnormalities: Secondary | ICD-10-CM

## 2015-07-30 NOTE — Telephone Encounter (Signed)
[facility] contacted and will not accept in the yellow urine container, will only accept in the Para Pak white clean vial. I tried calling patient son and LMOM to return call.

## 2015-07-30 NOTE — Telephone Encounter (Signed)
I called and spoke with son, [name] and he stated that they had to cancel appointment this morning due to patient's Cath. Coming out and water going everywhere. The reason they dropped off the stool specimen was because they want it checked for C-Diff due to patient being lethargic, not eating/drinking and yellowish mucus coming out in his feces. They collected stool about 2am this morning and refrigerated it. Please Advise.  I explained to him that we could not just order a test without Dr. Adriana Simas and evaluating patient and explained that a proper diagnosis has to be given in order to run test and it be covered. He understood and wants Dr. Mariea Clonts to order.

## 2015-07-30 NOTE — Telephone Encounter (Signed)
Ok, let's send the sample for c diff toxin if possible in that bottle

## 2015-07-31 NOTE — Telephone Encounter (Signed)
LMOM to return call.

## 2015-08-01 NOTE — Telephone Encounter (Signed)
Spoke with [name] and she will send her brother to get appropriate container. Left container up front for pick up and order placed.

## 2015-08-07 ENCOUNTER — Telehealth: Payer: Self-pay | Admitting: *Deleted

## 2015-08-07 NOTE — Telephone Encounter (Signed)
Received [employer] Supply Order Form for Dr. Mariea Clonts to review and sign. For wound dressings and Catheter Foley. Given to Dr. Mariea Clonts. To be faxed back to [phone] Inquiries #: [phone]

## 2015-08-13 ENCOUNTER — Ambulatory Visit: Payer: Medicare Other | Admitting: Internal Medicine

## 2015-08-13 ENCOUNTER — Other Ambulatory Visit: Payer: Medicare Other

## 2015-08-13 DIAGNOSIS — R195 Other fecal abnormalities: Secondary | ICD-10-CM

## 2015-08-14 LAB — CLOSTRIDIUM DIFFICILE BY PCR: Toxigenic C. Difficile by PCR: POSITIVE — AB

## 2015-08-15 ENCOUNTER — Telehealth: Payer: Self-pay | Admitting: *Deleted

## 2015-08-15 MED ORDER — METRONIDAZOLE 500 MG PO TABS: 500.0000 mg | ORAL_TABLET | Freq: Four times a day (QID) | ORAL | Status: DC

## 2015-08-15 NOTE — Telephone Encounter (Signed)
Spoke with patient's son regarding his lab results, informed him that his doctors has ordered medication for him that I will be sending to the pharmacy for pick up.  He stated that he has not had a loose stool for approximately 2 weeks, its more clay-like and thick now, with a occasional mucous stool.

## 2015-08-21 ENCOUNTER — Encounter: Payer: Self-pay | Admitting: Family Medicine

## 2015-08-24 ENCOUNTER — Telehealth: Payer: Self-pay | Admitting: *Deleted

## 2015-08-24 NOTE — Telephone Encounter (Signed)
Daughter, [name] called and stated that mediation called in for C-Diff , Patient is having difficulty swallowing- Can something else be given to help to clear up the C-diff. Patient daughter stated that patient is not swallowing at all. Stated would like something liquid.  Please Advise.

## 2015-08-25 DIAGNOSIS — R404 Transient alteration of awareness: Secondary | ICD-10-CM | POA: Diagnosis not present

## 2015-08-25 DIAGNOSIS — L89154 Pressure ulcer of sacral region, stage 4: Secondary | ICD-10-CM | POA: Diagnosis not present

## 2015-08-25 DIAGNOSIS — R339 Retention of urine, unspecified: Secondary | ICD-10-CM | POA: Diagnosis not present

## 2015-08-25 DIAGNOSIS — Z466 Encounter for fitting and adjustment of urinary device: Secondary | ICD-10-CM | POA: Diagnosis not present

## 2015-08-25 NOTE — Telephone Encounter (Signed)
Is he having any loose stool now?  If not, I would discontinue the antibiotics rather than changing to liquid oral vancomycin.  Upon review of the CMA note when the prescription was sent, he wasn't even having loose stools when the flagyl prescription was started.  He is probably colonized with the c diff at this point.  And, no, we should not recheck if he is asymptomatic.

## 2015-08-27 ENCOUNTER — Ambulatory Visit (INDEPENDENT_AMBULATORY_CARE_PROVIDER_SITE_OTHER): Payer: Medicare Other | Admitting: Internal Medicine

## 2015-08-27 VITALS — BP 110/70

## 2015-08-27 DIAGNOSIS — R339 Retention of urine, unspecified: Secondary | ICD-10-CM

## 2015-08-27 DIAGNOSIS — G231 Progressive supranuclear ophthalmoplegia [Steele-Richardson-Olszewski]: Secondary | ICD-10-CM

## 2015-08-27 DIAGNOSIS — A0471 Enterocolitis due to Clostridium difficile, recurrent: Secondary | ICD-10-CM

## 2015-08-27 DIAGNOSIS — R627 Adult failure to thrive: Secondary | ICD-10-CM

## 2015-08-27 DIAGNOSIS — R238 Other skin changes: Secondary | ICD-10-CM

## 2015-08-27 DIAGNOSIS — E43 Unspecified severe protein-calorie malnutrition: Secondary | ICD-10-CM | POA: Diagnosis not present

## 2015-08-27 DIAGNOSIS — A047 Enterocolitis due to Clostridium difficile: Secondary | ICD-10-CM | POA: Diagnosis not present

## 2015-08-27 MED ORDER — VANCOMYCIN 50 MG/ML ORAL SOLUTION: ORAL | Status: DC

## 2015-08-27 NOTE — Telephone Encounter (Signed)
LMOM to return call.

## 2015-08-27 NOTE — Telephone Encounter (Signed)
Seen and examined today, place on oral vancomycin liquid and hospice care was again recommended due to his almost complete absence of intake and major decline.

## 2015-08-27 NOTE — Progress Notes (Signed)
Patient ID: Ricky [name], male   DOB: [birth year], 81 y.o.   MRN: [id number]   Location: [facility] Provider: Rexene Edison. Mariea Clonts, D.O., C.M.D.  Code Status: DNR Goals of Care: Advanced Directive information Does patient have an advance directive?: Yes, Type of Advance Directive: [place], Does patient want to make changes to advanced directive?: No - Patient declined  Chief Complaint  Patient presents with  . Medical Management of Chronic Issues    yellow mucus BM, slight C-diff smell, low food,fluid intake, blister on rt hand,  ,    HPI: Patient is a 81 y.o. male seen in the office today for  Yellow mucus had been present--those were only presenting symptoms.  Infection low grade may have been the cause of the poor po intake and fluid intake.  Slight C diff smell.  3 bms per week.  No real food in about a week.  Put a bite in and he drools it out.  Had 1.5 boosts yesterday.  Some water and cranberry juice.    2-3 mos ago, he would drink 3-4 boosts per day.    Shingles vaccine and prevnar and flu shot?    Discussed hoyer lift.   Has CNA helping to hydrate him.  Completed aide and attendance paperwork.    Pt's daughter has concerns that her brother may be infected with c diff also and has not been treated or been to the doctor about it.    Review of Systems: some obtained from son and daughter present with pt Review of Systems  Unable to perform ROS: medical condition  Constitutional: Negative for fever, chills and malaise/fatigue.  Respiratory: Negative for cough.   Gastrointestinal: Positive for constipation. Negative for abdominal pain, blood in stool and melena.       With little to no intake past few days  Genitourinary:       Catheter in place with dark tea colored urine  Musculoskeletal: Negative for falls.       Contractures of upper and lower extremities  Skin: Negative for rash.       Blisters on hands from biting them per son    Neurological:       Progressive supranuclear palsy  Psychiatric/Behavioral: Positive for memory loss.    Past Medical History  Diagnosis Date  . IBS (irritable bowel syndrome)   . Diverticulosis   . Cataract   . OAB (overactive bladder)   . Macular degeneration   . Hypertension   . Parkinsonian syndrome   . Syncope, vasovagal   . 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 Unspecified transient cerebral ischemia   . Unspecified urinary incontinence   . Hypothyroidism   . Anemia, unspecified   . Depression   . Alzheimer's disease   . Gout, unspecified   . Thoracic or lumbosacral neuritis or radiculitis, unspecified   . Progressive supranuclear palsy   . Paralysis agitans   . Pneumonitis due to inhalation of food or vomitus   . Cellulitis and abscess of oral soft tissues   . Unspecified hypothyroidism   . Pneumonia, organism unspecified   . Pseudomonas infection in conditions classified elsewhere and of unspecified site   . Urinary tract infection, site not specified   . Osteoarthrosis, unspecified whether generalized or localized, unspecified site   . Osteoporosis, unspecified   . Gout, unspecified   . Thoracic or lumbosacral neuritis or radiculitis, unspecified   . Stroke     Past  Surgical History  Procedure Laterality Date  . Esophagogastroduodenoscopy    . Eye surgery  2006    LEFT CATARACT  . Esophagogastroduodenoscopy N/A 04/24/2015    Procedure: ESOPHAGOGASTRODUODENOSCOPY (EGD);  Surgeon: Carol Ada, MD;  Location: [facility]COPY;  Service: Endoscopy;  Laterality: N/A;    Allergies  Allergen Reactions  . Other Other (See Comments)    Dairy products- cause swallowing difficulty       Medication List       This list is accurate as of: 08/27/15  3:53 PM.  Always use your most recent med list.               acetaminophen 500 MG tablet  Commonly known as:  TYLENOL  Take 1,000 mg by mouth 3 (three) times daily.      AMBULATORY NON FORMULARY MEDICATION  Pressure Air Mattress Dx: G60.8, L89.150     bacitracin ointment  Apply 1 application topically 2 (two) times daily. Applies to head of penis to decrease infection from catheter.     CALTRATE 600+D PLUS MINERALS 600-800 MG-UNIT Chew  Chew 1 tablet by mouth 2 (two) times daily with a meal.     CRANBERRY SOFT PO  Take 2 capsules by mouth 3 (three) times daily.     ferrous sulfate 325 (65 FE) MG tablet  Take 325 mg by mouth every other day.     hydrALAZINE 25 MG tablet  Commonly known as:  APRESOLINE  Take 1/2 tablet by mouth twice daily to control blood pressure     ICAPS AREDS FORMULA PO  Take 2 capsules by mouth 2 (two) times daily with a meal.     lactose free nutrition Liqd  Take 90 mLs by mouth 3 (three) times daily between meals.     levothyroxine 25 MCG tablet  Commonly known as:  SYNTHROID, LEVOTHROID  TAKE 1 TABLET BY MOUTH DAILY     lidocaine 5 %  Commonly known as:  LIDODERM  PLACE 3 PATCHES ONTO THE SKIN DAILY. REMOVE & DISCARD PATCH WITHIN 12 HOURS OR AS DIRECTED BY MD     memantine 10 MG tablet  Commonly known as:  NAMENDA  Take 10 mg by mouth 2 (two) times daily.     metroNIDAZOLE 500 MG tablet  Commonly known as:  FLAGYL  Take 1 tablet (500 mg total) by mouth 4 (four) times daily.     NON FORMULARY  D-Mannose, Take 2 teaspoon by mouth with fluids 2 times a day     PHILLIPS COLON HEALTH Caps  Take 1 capsule by mouth 2 (two) times daily.     polyethylene glycol packet  Commonly known as:  MIRALAX / GLYCOLAX  Take 17 g by mouth daily as needed for mild constipation.     psyllium 58.6 % packet  Commonly known as:  METAMUCIL  Take 1 packet by mouth 2 (two) times daily.     Vitamin B-12 5000 MCG Subl  Place 5,000 mcg under the tongue 2 (two) times daily.     VOLTAREN 1 % Gel  Generic drug:  diclofenac sodium  Apply 2 g topically 4 (four) times daily as needed (pain).        Health Maintenance  Topic Date  Due  . PNA vac Low Risk Adult (2 of 2 - PCV13) 07/25/2011  . INFLUENZA VACCINE  06/18/2015  . ZOSTAVAX  11/18/2015 (Originally 10/03/1977)  . TETANUS/TDAP  11/20/2019    Physical Exam: Filed Vitals:   08/27/15 1520  BP: 110/70   There is no weight on file to calculate BMI. Physical Exam  Constitutional:  Increasingly cachectic black male with severe contractures seated in wheelchair hunched over with arms contracted to chest beneath chin  Cardiovascular: Normal rate, regular rhythm and normal heart sounds.   Pulmonary/Chest: Effort normal and breath sounds normal.  Neurological:  Nonverbal at all today; does open eyes when spoken to some of the time today  Skin:  Blister on left hand over index finger joint not yet ruptured; right hand with large ruptured blister secured with steristrip (both had been dressed with kerlix wrapped around to prevent further breakdown)    Labs reviewed: Basic Metabolic Panel:  Recent Labs  01/11/15 0530 01/11/15 0710  03/25/15 1720  04/23/15 0534 04/24/15 0533 04/25/15 0459 05/31/15 2243 06/01/15 0020 06/01/15 0523  NA 129*  --   < >  --   < > 136 139 135 131* 132* 133*  K 4.2  --   < >  --   < > 3.6 3.4* 3.8 4.4 4.2 4.1  CL 97  --   < >  --   < > 102 104 101 99* 101 100*  CO2 25  --   < >  --   < > 25 23 23 22   --  26  GLUCOSE 84  --   < >  --   < > 68 64* 54* 121* 103* 90  BUN 11  --   < >  --   < > 9 7 9  27* 35* 27*  CREATININE 0.67  --   < >  --   < > 0.65 0.68 0.76 1.04 1.10 0.88  CALCIUM 8.4  --   < >  --   < > 8.4* 8.5* 8.4* 9.3  --  9.1  MG 1.9  --   --   --   --  1.6* 1.9  --   --   --   --   PHOS 3.2  --   --   --   --  3.3  --   --   --   --   --   TSH  --  0.842  --  1.228  --  1.352  --   --   --   --   --   < > = values in this interval not displayed. Liver Function Tests:  Recent Labs  04/23/15 0534 05/31/15 2243 06/01/15 0523  AST 35 31 26  ALT 18 22 23   ALKPHOS 49 64 60  BILITOT 1.7* 0.5 0.5  PROT 6.1* 7.4 7.2   ALBUMIN 2.7* 3.5 3.4*   No results for input(s): LIPASE, AMYLASE in the last 8760 hours. No results for input(s): AMMONIA in the last 8760 hours. CBC:  Recent Labs  04/25/15 0459 04/27/15 1144 06/01/15 0009 06/01/15 0020 06/01/15 0523  WBC 5.4 6.5 6.2  --  5.3  NEUTROABS  --  5.1 4.9  --  3.3  HGB 10.6*  --  14.0 17.0 12.6*  HCT 32.4* 36.6* 43.1 50.0 38.8*  MCV 85.9  --  89.2  --  89.2  PLT 257  --  132*  --  166   Lipid Panel: No results for input(s): CHOL, HDL, LDLCALC, TRIG, CHOLHDL, LDLDIRECT in the last 8760 hours. Lab Results  Component Value Date   HGBA1C 5.3 06/21/2012   cdiff toxin positive 08/13/15  Assessment/Plan 1. Recurrent colitis due to Clostridium difficile - d/c flagyl -  start vanc liquid - vancomycin (VANCOCIN) 50 mg/mL oral solution; 125mg  po qid x 14 days, then bid x 7 days, then daily for 7 days, then every 3 days for 14 days.  Dispense: 120 mL; Refill: 3 -discussed that this again worsens his already terminal prognosis  2. FTT (failure to thrive) in adult -much worse since this latest c diff infection -his daughter is agreeable to hospice but his son is still not ready--Lisa is going to talk more with Ricky [name] to convince him at this point  3. Progressive supranuclear ophthalmoplegia or palsy (HCC) -end stages, needs hospice care  4. Urinary retention with incomplete bladder emptying -cont catheter -has h/o recurrent infections, but has been colonized and overtreated  5. Protein-calorie malnutrition, severe ([place]) -ongoing due to terminal PSP  6. Blisters of multiple sites -due to contractures and pressure is my impression--doubt he is biting himself unless he's that distressed--redressed for protection  Labs/tests ordered:  Hospice order will gladly be given when children agree Next appt:  prn  Ricky [name], D.O. [facility] [address], [place] [address] Cell Phone (Mon-Fri  8am-5pm):  [phone] On Call:  [phone] & follow prompts after 5pm & weekends Office Phone:  [phone] Office Fax:  [phone]

## 2015-08-27 NOTE — Telephone Encounter (Signed)
Ricky [name], daughter called back and stated that patient is having yellowish loose stools and smells like c-diff. Would please like the liquid antibiotic to take care of this now so it doesn't get worse. Please Advise.

## 2015-08-27 NOTE — Telephone Encounter (Signed)
Patient has an appointment today with Dr. Mariea Clonts. Printed and placed in patient chart to discuss.

## 2015-08-28 ENCOUNTER — Telehealth: Payer: Self-pay | Admitting: *Deleted

## 2015-08-28 NOTE — Telephone Encounter (Signed)
[name], daughter called and stated that the Vancomycin Liquid is not covered under patient's insurance plan and I need to call #[phone] and tell them that the medication is  medically necessary. I called twice and spoke with Raney the first time and the pharmacist and both told me that the medication is "excluded from the plan and cannot have a prior authorization" I called for the second time after speaking with the daughter [name] because she told me that she spoke with someone there and they told her I had to ask for a "Special Exception" so I called back and spoke with Lenoria Farrier and she also stated that the medication cannot be authorized due to being excluded from the plan. I called the daughter and she stated that she will call the insurance company and see what she could get done and call me back.

## 2015-08-29 ENCOUNTER — Inpatient Hospital Stay (HOSPITAL_COMMUNITY)
Admission: EM | Admit: 2015-08-29 | Discharge: 2015-09-02 | DRG: 871 | Disposition: A | Discharge status: Hospice | Attending: Family Medicine | Admitting: Family Medicine

## 2015-08-29 ENCOUNTER — Encounter: Payer: Self-pay | Admitting: Internal Medicine

## 2015-08-29 ENCOUNTER — Emergency Department (HOSPITAL_COMMUNITY)

## 2015-08-29 ENCOUNTER — Telehealth: Payer: Self-pay

## 2015-08-29 ENCOUNTER — Encounter (HOSPITAL_COMMUNITY): Payer: Self-pay | Admitting: Emergency Medicine

## 2015-08-29 DIAGNOSIS — F0280 Dementia in other diseases classified elsewhere without behavioral disturbance: Secondary | ICD-10-CM | POA: Diagnosis present

## 2015-08-29 DIAGNOSIS — Z8673 Personal history of transient ischemic attack (TIA), and cerebral infarction without residual deficits: Secondary | ICD-10-CM

## 2015-08-29 DIAGNOSIS — R627 Adult failure to thrive: Secondary | ICD-10-CM | POA: Diagnosis present

## 2015-08-29 DIAGNOSIS — F0390 Unspecified dementia without behavioral disturbance: Secondary | ICD-10-CM | POA: Diagnosis present

## 2015-08-29 DIAGNOSIS — E869 Volume depletion, unspecified: Secondary | ICD-10-CM | POA: Diagnosis present

## 2015-08-29 DIAGNOSIS — R339 Retention of urine, unspecified: Secondary | ICD-10-CM | POA: Diagnosis present

## 2015-08-29 DIAGNOSIS — A047 Enterocolitis due to Clostridium difficile: Secondary | ICD-10-CM | POA: Diagnosis present

## 2015-08-29 DIAGNOSIS — G20C Parkinsonism, unspecified: Secondary | ICD-10-CM | POA: Diagnosis present

## 2015-08-29 DIAGNOSIS — D649 Anemia, unspecified: Secondary | ICD-10-CM | POA: Diagnosis present

## 2015-08-29 DIAGNOSIS — M24542 Contracture, left hand: Secondary | ICD-10-CM | POA: Diagnosis present

## 2015-08-29 DIAGNOSIS — G309 Alzheimer's disease, unspecified: Secondary | ICD-10-CM | POA: Diagnosis present

## 2015-08-29 DIAGNOSIS — Z66 Do not resuscitate: Secondary | ICD-10-CM | POA: Diagnosis present

## 2015-08-29 DIAGNOSIS — Z515 Encounter for palliative care: Secondary | ICD-10-CM

## 2015-08-29 DIAGNOSIS — A419 Sepsis, unspecified organism: Principal | ICD-10-CM

## 2015-08-29 DIAGNOSIS — E860 Dehydration: Secondary | ICD-10-CM | POA: Diagnosis present

## 2015-08-29 DIAGNOSIS — E875 Hyperkalemia: Secondary | ICD-10-CM | POA: Diagnosis present

## 2015-08-29 DIAGNOSIS — Z681 Body mass index (BMI) 19 or less, adult: Secondary | ICD-10-CM

## 2015-08-29 DIAGNOSIS — A0471 Enterocolitis due to Clostridium difficile, recurrent: Secondary | ICD-10-CM

## 2015-08-29 DIAGNOSIS — M24541 Contracture, right hand: Secondary | ICD-10-CM | POA: Diagnosis present

## 2015-08-29 DIAGNOSIS — E871 Hypo-osmolality and hyponatremia: Secondary | ICD-10-CM | POA: Diagnosis present

## 2015-08-29 DIAGNOSIS — G20 Parkinson's disease: Secondary | ICD-10-CM | POA: Diagnosis present

## 2015-08-29 DIAGNOSIS — Z825 Family history of asthma and other chronic lower respiratory diseases: Secondary | ICD-10-CM

## 2015-08-29 DIAGNOSIS — M810 Age-related osteoporosis without current pathological fracture: Secondary | ICD-10-CM | POA: Diagnosis present

## 2015-08-29 DIAGNOSIS — T17998A Other foreign object in respiratory tract, part unspecified causing other injury, initial encounter: Secondary | ICD-10-CM | POA: Diagnosis not present

## 2015-08-29 DIAGNOSIS — G231 Progressive supranuclear ophthalmoplegia [Steele-Richardson-Olszewski]: Secondary | ICD-10-CM | POA: Diagnosis present

## 2015-08-29 DIAGNOSIS — G92 Toxic encephalopathy: Secondary | ICD-10-CM | POA: Diagnosis present

## 2015-08-29 DIAGNOSIS — L89150 Pressure ulcer of sacral region, unstageable: Secondary | ICD-10-CM | POA: Diagnosis present

## 2015-08-29 DIAGNOSIS — M109 Gout, unspecified: Secondary | ICD-10-CM | POA: Diagnosis present

## 2015-08-29 DIAGNOSIS — E039 Hypothyroidism, unspecified: Secondary | ICD-10-CM | POA: Diagnosis present

## 2015-08-29 DIAGNOSIS — J9811 Atelectasis: Secondary | ICD-10-CM | POA: Diagnosis present

## 2015-08-29 DIAGNOSIS — I10 Essential (primary) hypertension: Secondary | ICD-10-CM | POA: Diagnosis present

## 2015-08-29 DIAGNOSIS — T17908A Unspecified foreign body in respiratory tract, part unspecified causing other injury, initial encounter: Secondary | ICD-10-CM

## 2015-08-29 DIAGNOSIS — R64 Cachexia: Secondary | ICD-10-CM | POA: Diagnosis present

## 2015-08-29 DIAGNOSIS — Z87891 Personal history of nicotine dependence: Secondary | ICD-10-CM

## 2015-08-29 DIAGNOSIS — Z993 Dependence on wheelchair: Secondary | ICD-10-CM

## 2015-08-29 DIAGNOSIS — E43 Unspecified severe protein-calorie malnutrition: Secondary | ICD-10-CM | POA: Diagnosis present

## 2015-08-29 DIAGNOSIS — Z823 Family history of stroke: Secondary | ICD-10-CM

## 2015-08-29 DIAGNOSIS — M40209 Unspecified kyphosis, site unspecified: Secondary | ICD-10-CM | POA: Diagnosis present

## 2015-08-29 DIAGNOSIS — E872 Acidosis: Secondary | ICD-10-CM | POA: Diagnosis present

## 2015-08-29 DIAGNOSIS — I517 Cardiomegaly: Secondary | ICD-10-CM | POA: Diagnosis present

## 2015-08-29 DIAGNOSIS — H3530 Unspecified macular degeneration: Secondary | ICD-10-CM | POA: Diagnosis present

## 2015-08-29 LAB — I-STAT CG4 LACTIC ACID, ED: LACTIC ACID, VENOUS: 4.82 mmol/L — AB (ref 0.5–2.0)

## 2015-08-29 MED ORDER — SODIUM CHLORIDE 0.9 % IV BOLUS (SEPSIS)
1000.0000 mL | Freq: Once | INTRAVENOUS | Status: AC
  Administered 2015-08-29: 1000 mL via INTRAVENOUS

## 2015-08-29 MED ORDER — VANCOMYCIN HCL 10 G IV SOLR
1500.0000 mg | Freq: Once | INTRAVENOUS | Status: AC
  Administered 2015-08-29: 1500 mg via INTRAVENOUS
  Filled 2015-08-29: qty 1500

## 2015-08-29 MED ORDER — PIPERACILLIN-TAZOBACTAM 3.375 G IVPB 30 MIN
3.3750 g | Freq: Once | INTRAVENOUS | Status: AC
  Administered 2015-08-29: 3.375 g via INTRAVENOUS
  Filled 2015-08-29: qty 50

## 2015-08-29 NOTE — ED Notes (Signed)
EDP spoke to family. They report that pt was dx with c-diff last week but could not tolerate flagyl. Pt was supposed to start vancomycin last night but had an episode of aspiration last night. Pt mental status and been declining since then.

## 2015-08-29 NOTE — Telephone Encounter (Signed)
Interesting b/c her son was talking about a different hospice.  Glad they are finally on board with hospice for him.  I will gladly be attending of record.

## 2015-08-29 NOTE — ED Provider Notes (Signed)
CSN: [id number]     Arrival date & time 08/29/15  2132 History   First MD Initiated Contact with Patient 08/29/15 2154     Chief Complaint  Patient presents with  . Altered Mental Status    Ricky [name] is a 81 y.o. male who was placed on hospice today who is DO NOT RESUSCITATE with a history of dementia, stroke, anemia, who presents for altered mental status and concern for aspiration. The patient's family was able to provide information as the patient is nonverbal. The patient's daughter Ricky [name] reports that the patient has been having C. difficile for the last week. The patient was initially on oral Flagyl however, due to intolerance of medication ingestion, the patient was able to have by mouth Vanco ordered several days ago. Due to complications, they were unable to get the vancomycin until today. They are mostly concerned because overnight, the patient had an aspiration event with a chocolate boost shake. The daughter reports that the patient was found covered in the boost this morning with some in his oropharynx. The patient was "not himself" today and was less interactive. The daughter reports that when the patient is very ill, he lets her know by drooping his lips and she says that he has been doing this all day concerning her. The patient will only open his eyes to verbal stimulation and will otherwise not interact at all. The family was called and they say despite the patient now being in hospice, they want the patient treated for this infection.   (Consider location/radiation/quality/duration/timing/severity/associated sxs/prior Treatment) Patient is a 81 y.o. male presenting with altered mental status. The history is provided by a relative (The patient's daughter, Ricky [name].). The history is limited by the condition of the patient.  Altered Mental Status Presenting symptoms: behavior changes and partial responsiveness   Severity:  Severe Most recent episode:  Today Episode history:   Single Duration:  4 days Timing:  Unable to specify Progression:  Unable to specify Chronicity:  New Associated symptoms: no fever  Headaches: hypothermic on arrival.   Associated symptoms comment:  Patient unable to describe symptoms as he is nonverbal and will not respond other than opening eyes.   Past Medical History  Diagnosis Date  . IBS (irritable bowel syndrome)   . Diverticulosis   . Cataract   . OAB (overactive bladder)   . Macular degeneration   . Hypertension   . Parkinsonian syndrome ([place])   . Syncope, vasovagal   . Dementia due to Parkinson's disease without behavioral disturbance ([place])   . Unspecified transient cerebral ischemia   . Unspecified urinary incontinence   . Hypothyroidism   . Anemia, unspecified   . Depression   . Alzheimer's disease   . Gout, unspecified   . Thoracic or lumbosacral neuritis or radiculitis, unspecified   . Progressive supranuclear palsy ([place])   . Paralysis agitans ([place])   . Pneumonitis due to inhalation of food or vomitus ([place])   . Cellulitis and abscess of oral soft tissues   . Unspecified hypothyroidism   . Pneumonia, organism unspecified   . Pseudomonas infection in conditions classified elsewhere and of unspecified site   . Urinary tract infection, site not specified   . Osteoarthrosis, unspecified whether generalized or localized, unspecified site   . Osteoporosis, unspecified   . Gout, unspecified   . Thoracic or lumbosacral neuritis or radiculitis, unspecified   . Stroke [facility])    Past Surgical History  Procedure Laterality Date  . Esophagogastroduodenoscopy    .  Eye surgery  2006    LEFT CATARACT  . Esophagogastroduodenoscopy N/A 04/24/2015    Procedure: ESOPHAGOGASTRODUODENOSCOPY (EGD);  Surgeon: Carol Ada, MD;  Location: [facility] ENDOSCOPY;  Service: Endoscopy;  Laterality: N/A;   Family History  Problem Relation Age of Onset  . Cancer Mother   . Stroke Father   . Liver disease Father   . Kidney disease Brother    . Liver disease Brother   . Emphysema Brother    Social History  Substance Use Topics  . Smoking status: Former Research scientist (life sciences)  . Smokeless tobacco: Never Used     Comment: QUIT SMOKING BACK IN THE LATE 50'S  . Alcohol Use: No    Review of Systems  Unable to perform ROS: Dementia  Constitutional: Negative for fever.  Neurological: Headaches: hypothermic on arrival.      Allergies  Other  Home Medications   Prior to Admission medications   Medication Sig Start Date End Date Taking? Authorizing Provider  acetaminophen (TYLENOL) 500 MG tablet Take 1,000 mg by mouth 3 (three) times daily.     Historical Provider, MD  AMBULATORY NON FORMULARY MEDICATION Pressure Air Mattress Dx: G60.8, L89.150 10/30/14   Tiffany L Reed, DO  bacitracin ointment Apply 1 application topically 2 (two) times daily. Applies to head of penis to decrease infection from catheter.    Historical Provider, MD  Calcium Carbonate-Vit D-Min (CALTRATE 600+D PLUS) 600-800 MG-UNIT CHEW Chew 1 tablet by mouth 2 (two) times daily with a meal.     Historical Provider, MD  CRANBERRY SOFT PO Take 2 capsules by mouth 3 (three) times daily.     Historical Provider, MD  Cyanocobalamin (VITAMIN B-12) 5000 MCG SUBL Place 5,000 mcg under the tongue 2 (two) times daily.    Historical Provider, MD  ferrous sulfate 325 (65 FE) MG tablet Take 325 mg by mouth every other day.     Historical Provider, MD  hydrALAZINE (APRESOLINE) 25 MG tablet Take 1/2 tablet by mouth twice daily to control blood pressure Patient taking differently: Take 12.5 mg by mouth 2 (two) times daily. Take 1/2 tablet by mouth twice daily to control blood pressure 11/14/14   Blanchie Serve, MD  lactose free nutrition (BOOST) LIQD Take 90 mLs by mouth 3 (three) times daily between meals. Patient not taking: Reported on 08/27/2015 07/01/14   Barton Dubois, MD  levothyroxine (SYNTHROID, LEVOTHROID) 25 MCG tablet TAKE 1 TABLET BY MOUTH DAILY Patient taking differently: TAKE  25MCG BY MOUTH DAILY 05/14/15   Tiffany L Reed, DO  lidocaine (LIDODERM) 5 % PLACE 3 PATCHES ONTO THE SKIN DAILY. REMOVE & DISCARD PATCH WITHIN 12 HOURS OR AS DIRECTED BY MD Patient not taking: Reported on 08/27/2015 02/16/15   Tiffany L Reed, DO  memantine (NAMENDA) 10 MG tablet Take 10 mg by mouth 2 (two) times daily.    Historical Provider, MD  metroNIDAZOLE (FLAGYL) 500 MG tablet Take 1 tablet (500 mg total) by mouth 4 (four) times daily. 08/15/15   Tiffany L Reed, DO  Multiple Vitamins-Minerals (ICAPS AREDS FORMULA PO) Take 2 capsules by mouth 2 (two) times daily with a meal.     Historical Provider, MD  NON FORMULARY D-Mannose, Take 2 teaspoon by mouth with fluids 2 times a day    Historical Provider, MD  polyethylene glycol (MIRALAX / GLYCOLAX) packet Take 17 g by mouth daily as needed for mild constipation. Patient not taking: Reported on 08/27/2015 07/01/14   Barton Dubois, MD  Probiotic Product ([place]) CAPS  Take 1 capsule by mouth 2 (two) times daily.    Historical Provider, MD  psyllium (METAMUCIL) 58.6 % packet Take 1 packet by mouth 2 (two) times daily.     Historical Provider, MD  vancomycin (VANCOCIN) 50 mg/mL oral solution 125mg  po qid x 14 days, then bid x 7 days, then daily for 7 days, then every 3 days for 14 days. 08/27/15   Tiffany L Reed, DO  VOLTAREN 1 % GEL Apply 2 g topically 4 (four) times daily as needed (pain).  07/10/14   Historical Provider, MD   BP 134/57 mmHg  Pulse 59  Temp(Src) 91.3 F (32.9 C) (Rectal)  Resp 21  SpO2 91% Physical Exam  Constitutional: He appears cachectic. He is sleeping. He has a sickly appearance. He appears ill. No distress.  Eyes: Pupils are equal, round, and reactive to light.  Cardiovascular: Regular rhythm.  Bradycardia present.   No murmur heard. Pulmonary/Chest: No respiratory distress. He has no wheezes. He has rhonchi. He exhibits no tenderness.  Abdominal: There is no tenderness. There is no rebound.   Musculoskeletal: He exhibits no tenderness.  Neurological: He exhibits abnormal muscle tone (contractures in bilateral arms). GCS eye subscore is 3. GCS verbal subscore is 1. GCS motor subscore is 1.  Skin: He is not diaphoretic.  Cool to the touch  Psychiatric: He is noncommunicative.  Nursing note reviewed.   ED Course  Procedures (including critical care time) Labs Review Labs Reviewed  CBC WITH DIFFERENTIAL/PLATELET - Abnormal; Notable for the following:    RBC 3.91 (*)    Hemoglobin 11.8 (*)    HCT 32.8 (*)    Lymphs Abs 0.6 (*)    All other components within normal limits  COMPREHENSIVE METABOLIC PANEL - Abnormal; Notable for the following:    Sodium 122 (*)    Potassium 5.4 (*)    Chloride 89 (*)    CO2 18 (*)    BUN 39 (*)    Calcium 8.7 (*)    Total Protein 5.4 (*)    Albumin 2.6 (*)    GFR calc non Af Amer 59 (*)    All other components within normal limits  I-STAT CG4 LACTIC ACID, ED - Abnormal; Notable for the following:    Lactic Acid, Venous 4.82 (*)    All other components within normal limits    Imaging Review Dg Chest 1 View  08/29/2015  CLINICAL DATA:  81 year old male with altered mental status, hypothermia and in hospice care. EXAM: CHEST 1 VIEW COMPARISON:  05/31/2015 and prior radiographs FINDINGS: Cardiomegaly identified. Mild bibasilar atelectasis again noted. There is no evidence of focal airspace disease, pulmonary edema, suspicious pulmonary nodule/mass, pleural effusion, or pneumothorax. No acute bony abnormalities are identified. IMPRESSION: Cardiomegaly and mild basilar atelectasis. Electronically Signed   By: Margarette Canada M.D.   On: 08/29/2015 23:23   I have personally reviewed and evaluated these images and lab results as part of my medical decision-making.   EKG Interpretation None      MDM   Ricky [name] is a 81 y.o. male who was placed on hospice today who is DO NOT RESUSCITATE with a history of dementia, stroke, anemia, who  presents for altered mental status and concern for aspiration. The patient has recurrent C. difficile infection and has not taken oral vancomycin for this. The family describes an aspiration event overnight and decline of the patient's mental status today. The patient was approved for hospice this afternoon however, given the  patient's mental status declined, the family wanted him to be evaluated and treated for possible aspiration. Next  On arrival, the patient is only responsive by opening his eyes. The patient had fairly unremarkable breath sounds on exam but otherwise appeared frail, cachectic, and did not have any other responses.  The patient was very hypothermic on arrival with a temperature of 91. The patient was quickly even a bear hugger as well as warm fluids. At the family's request, the patient will have laboratory testing, chest x-ray, and empiric antibiotics and fluids started. The family is on their way to the ED for further evaluation.  The patient's laboratory testing results are seen above. The patient's lactic acid was elevated at 4.82. The patient did not have an elevated white blood cell count however, he was slightly anemic at 11.8. The patient's CMP however revealed hyponatremia of 122, potassium of 5.4, and a normal creatinine. The patient's chest x-ray showed cardiomegaly and mild basilar atelectasis however, there is no evidence of infection.  Given the patient's gradual worsening mental status in the setting of C. difficile that has not been treated in the last 48 hours, suspect the patient is becoming septic from this infection. There was a concern for aspiration event overnight however, the patient's lungs were clear, his chest x-ray did not reveal pneumonia, and the patient's testing did not reveal a leukocytosis.  The patient's daughter was informed of the patient's dire outlook. Given the patient's elevated lactic acid and significant left lung abnormalities with worsening  mental status, the decision was made to admit the patient for further management of his sepsis. The patient remained stable on room air and the patient was not tachycardic in the ED.  Anticipate palliative care involvement and further hospice management.  This patient was seen with Dr. Oleta Mouse, emergency medicine attending.  Final diagnoses:  Sepsis, due to unspecified organism [facility])       Antony Blackbird, MD 08/30/15 [id number]  Forde Dandy, MD 08/30/15 [phone]

## 2015-08-29 NOTE — ED Notes (Signed)
Pt. arrived EMS from home , family reported increasing altered mental status onset today , pt. is currently on [place] / DNR , pt.'s arms are contracted ( flexion) with pressure ulcers at sacrum / buttocks.

## 2015-08-29 NOTE — ED Notes (Signed)
Hypothermic at arrrival = 91.8 F rectal.

## 2015-08-29 NOTE — ED Notes (Signed)
Bair Hugger placed on pt 

## 2015-08-29 NOTE — Telephone Encounter (Signed)
Vicky from [facility] called indicating she has spoken with patient's family and all are on board with Hospice being involved in patient's care. In order to proceed with services verbal order needs to be given by Dr.Reed.  I authorized verbal order per standing protocol at [facility]. Message will be routed to Dr.Reed as a FYI.

## 2015-08-29 NOTE — ED Notes (Signed)
Pt in radiology 

## 2015-08-30 ENCOUNTER — Encounter (HOSPITAL_COMMUNITY): Payer: Self-pay | Admitting: Family Medicine

## 2015-08-30 DIAGNOSIS — G20 Parkinson's disease: Secondary | ICD-10-CM | POA: Diagnosis not present

## 2015-08-30 DIAGNOSIS — A408 Other streptococcal sepsis: Secondary | ICD-10-CM

## 2015-08-30 DIAGNOSIS — E43 Unspecified severe protein-calorie malnutrition: Secondary | ICD-10-CM

## 2015-08-30 DIAGNOSIS — F0390 Unspecified dementia without behavioral disturbance: Secondary | ICD-10-CM | POA: Diagnosis not present

## 2015-08-30 DIAGNOSIS — R627 Adult failure to thrive: Secondary | ICD-10-CM

## 2015-08-30 DIAGNOSIS — D649 Anemia, unspecified: Secondary | ICD-10-CM | POA: Diagnosis present

## 2015-08-30 DIAGNOSIS — Z993 Dependence on wheelchair: Secondary | ICD-10-CM | POA: Diagnosis not present

## 2015-08-30 DIAGNOSIS — A419 Sepsis, unspecified organism: Principal | ICD-10-CM

## 2015-08-30 DIAGNOSIS — J9811 Atelectasis: Secondary | ICD-10-CM | POA: Diagnosis present

## 2015-08-30 DIAGNOSIS — Z825 Family history of asthma and other chronic lower respiratory diseases: Secondary | ICD-10-CM | POA: Diagnosis not present

## 2015-08-30 DIAGNOSIS — E875 Hyperkalemia: Secondary | ICD-10-CM | POA: Diagnosis present

## 2015-08-30 DIAGNOSIS — E871 Hypo-osmolality and hyponatremia: Secondary | ICD-10-CM | POA: Diagnosis present

## 2015-08-30 DIAGNOSIS — Z681 Body mass index (BMI) 19 or less, adult: Secondary | ICD-10-CM | POA: Diagnosis not present

## 2015-08-30 DIAGNOSIS — Z823 Family history of stroke: Secondary | ICD-10-CM | POA: Diagnosis not present

## 2015-08-30 DIAGNOSIS — Z8673 Personal history of transient ischemic attack (TIA), and cerebral infarction without residual deficits: Secondary | ICD-10-CM | POA: Diagnosis not present

## 2015-08-30 DIAGNOSIS — L89150 Pressure ulcer of sacral region, unstageable: Secondary | ICD-10-CM

## 2015-08-30 DIAGNOSIS — M810 Age-related osteoporosis without current pathological fracture: Secondary | ICD-10-CM | POA: Diagnosis present

## 2015-08-30 DIAGNOSIS — T17998A Other foreign object in respiratory tract, part unspecified causing other injury, initial encounter: Secondary | ICD-10-CM | POA: Diagnosis present

## 2015-08-30 DIAGNOSIS — G309 Alzheimer's disease, unspecified: Secondary | ICD-10-CM | POA: Diagnosis present

## 2015-08-30 DIAGNOSIS — Z66 Do not resuscitate: Secondary | ICD-10-CM | POA: Diagnosis present

## 2015-08-30 DIAGNOSIS — M24541 Contracture, right hand: Secondary | ICD-10-CM | POA: Diagnosis present

## 2015-08-30 DIAGNOSIS — E039 Hypothyroidism, unspecified: Secondary | ICD-10-CM | POA: Diagnosis present

## 2015-08-30 DIAGNOSIS — E860 Dehydration: Secondary | ICD-10-CM | POA: Diagnosis present

## 2015-08-30 DIAGNOSIS — G231 Progressive supranuclear ophthalmoplegia [Steele-Richardson-Olszewski]: Secondary | ICD-10-CM | POA: Diagnosis present

## 2015-08-30 DIAGNOSIS — A047 Enterocolitis due to Clostridium difficile: Secondary | ICD-10-CM | POA: Diagnosis present

## 2015-08-30 DIAGNOSIS — Z515 Encounter for palliative care: Secondary | ICD-10-CM

## 2015-08-30 DIAGNOSIS — R339 Retention of urine, unspecified: Secondary | ICD-10-CM | POA: Diagnosis present

## 2015-08-30 DIAGNOSIS — F0280 Dementia in other diseases classified elsewhere without behavioral disturbance: Secondary | ICD-10-CM | POA: Diagnosis present

## 2015-08-30 DIAGNOSIS — I517 Cardiomegaly: Secondary | ICD-10-CM | POA: Diagnosis present

## 2015-08-30 DIAGNOSIS — G92 Toxic encephalopathy: Secondary | ICD-10-CM | POA: Diagnosis present

## 2015-08-30 DIAGNOSIS — Z87891 Personal history of nicotine dependence: Secondary | ICD-10-CM | POA: Diagnosis not present

## 2015-08-30 DIAGNOSIS — T17998D Other foreign object in respiratory tract, part unspecified causing other injury, subsequent encounter: Secondary | ICD-10-CM | POA: Diagnosis not present

## 2015-08-30 DIAGNOSIS — M109 Gout, unspecified: Secondary | ICD-10-CM | POA: Diagnosis present

## 2015-08-30 DIAGNOSIS — I10 Essential (primary) hypertension: Secondary | ICD-10-CM | POA: Diagnosis present

## 2015-08-30 DIAGNOSIS — M40209 Unspecified kyphosis, site unspecified: Secondary | ICD-10-CM | POA: Diagnosis present

## 2015-08-30 DIAGNOSIS — E872 Acidosis: Secondary | ICD-10-CM | POA: Diagnosis present

## 2015-08-30 DIAGNOSIS — H3530 Unspecified macular degeneration: Secondary | ICD-10-CM | POA: Diagnosis present

## 2015-08-30 DIAGNOSIS — M24542 Contracture, left hand: Secondary | ICD-10-CM | POA: Diagnosis present

## 2015-08-30 DIAGNOSIS — R64 Cachexia: Secondary | ICD-10-CM | POA: Diagnosis present

## 2015-08-30 DIAGNOSIS — E869 Volume depletion, unspecified: Secondary | ICD-10-CM | POA: Diagnosis present

## 2015-08-30 LAB — CBC
HEMATOCRIT: 32.5 % — AB (ref 39.0–52.0)
Hemoglobin: 11.6 g/dL — ABNORMAL LOW (ref 13.0–17.0)
MCH: 30.2 pg (ref 26.0–34.0)
MCHC: 35.7 g/dL (ref 30.0–36.0)
MCV: 84.6 fL (ref 78.0–100.0)
Platelets: 121 10*3/uL — ABNORMAL LOW (ref 150–400)
RBC: 3.84 MIL/uL — ABNORMAL LOW (ref 4.22–5.81)
RDW: 13.7 % (ref 11.5–15.5)
WBC: 4.5 10*3/uL (ref 4.0–10.5)

## 2015-08-30 LAB — BASIC METABOLIC PANEL
ANION GAP: 13 (ref 5–15)
BUN: 40 mg/dL — AB (ref 6–20)
CO2: 22 mmol/L (ref 22–32)
Calcium: 8.9 mg/dL (ref 8.9–10.3)
Chloride: 88 mmol/L — ABNORMAL LOW (ref 101–111)
Creatinine, Ser: 1.05 mg/dL (ref 0.61–1.24)
GFR, EST NON AFRICAN AMERICAN: 58 mL/min — AB (ref >60)
Glucose, Bld: 80 mg/dL (ref 65–99)
POTASSIUM: 5.5 mmol/L — AB (ref 3.5–5.1)
SODIUM: 123 mmol/L — AB (ref 135–145)

## 2015-08-30 LAB — CBC WITH DIFFERENTIAL/PLATELET
BASOS PCT: 0 %
Basophils Absolute: 0 10*3/uL (ref 0.0–0.1)
EOS ABS: 0 10*3/uL (ref 0.0–0.7)
EOS PCT: 0 %
HCT: 32.8 % — ABNORMAL LOW (ref 39.0–52.0)
Hemoglobin: 11.8 g/dL — ABNORMAL LOW (ref 13.0–17.0)
LYMPHS ABS: 0.6 10*3/uL — AB (ref 0.7–4.0)
Lymphocytes Relative: 12 %
MCH: 30.2 pg (ref 26.0–34.0)
MCHC: 36 g/dL (ref 30.0–36.0)
MCV: 83.9 fL (ref 78.0–100.0)
MONO ABS: 0.4 10*3/uL (ref 0.1–1.0)
Monocytes Relative: 9 %
NEUTROS PCT: 79 %
Neutro Abs: 4 10*3/uL (ref 1.7–7.7)
PLATELETS: 112 10*3/uL — AB (ref 150–400)
RBC: 3.91 MIL/uL — ABNORMAL LOW (ref 4.22–5.81)
RDW: 13.6 % (ref 11.5–15.5)
WBC: 5 10*3/uL (ref 4.0–10.5)

## 2015-08-30 LAB — URINE MICROSCOPIC-ADD ON

## 2015-08-30 LAB — I-STAT CG4 LACTIC ACID, ED: Lactic Acid, Venous: 2.83 mmol/L (ref 0.5–2.0)

## 2015-08-30 LAB — COMPREHENSIVE METABOLIC PANEL
ALBUMIN: 2.6 g/dL — AB (ref 3.5–5.0)
ALK PHOS: 54 U/L (ref 38–126)
ALT: 27 U/L (ref 17–63)
AST: 41 U/L (ref 15–41)
Anion gap: 15 (ref 5–15)
BILIRUBIN TOTAL: 0.6 mg/dL (ref 0.3–1.2)
BUN: 39 mg/dL — ABNORMAL HIGH (ref 6–20)
CO2: 18 mmol/L — AB (ref 22–32)
Calcium: 8.7 mg/dL — ABNORMAL LOW (ref 8.9–10.3)
Chloride: 89 mmol/L — ABNORMAL LOW (ref 101–111)
Creatinine, Ser: 1.04 mg/dL (ref 0.61–1.24)
GFR calc Af Amer: >60 mL/min (ref >60)
GFR calc non Af Amer: 59 mL/min — ABNORMAL LOW (ref >60)
GLUCOSE: 90 mg/dL (ref 65–99)
Potassium: 5.4 mmol/L — ABNORMAL HIGH (ref 3.5–5.1)
SODIUM: 122 mmol/L — AB (ref 135–145)
TOTAL PROTEIN: 5.4 g/dL — AB (ref 6.5–8.1)

## 2015-08-30 LAB — URINALYSIS, ROUTINE W REFLEX MICROSCOPIC
GLUCOSE, UA: NEGATIVE mg/dL
KETONES UR: 15 mg/dL — AB
Nitrite: POSITIVE — AB
PH: 7 (ref 5.0–8.0)
Protein, ur: >300 mg/dL — AB
SPECIFIC GRAVITY, URINE: 1.022 (ref 1.005–1.030)
Urobilinogen, UA: 1 mg/dL (ref 0.0–1.0)

## 2015-08-30 LAB — LACTIC ACID, PLASMA
LACTIC ACID, VENOUS: 1.7 mmol/L (ref 0.5–2.0)
Lactic Acid, Venous: 1.7 mmol/L (ref 0.5–2.0)

## 2015-08-30 MED ORDER — BOOST / RESOURCE BREEZE PO LIQD
1.0000 | Freq: Three times a day (TID) | ORAL | Status: DC
  Administered 2015-08-30 – 2015-09-02 (×2): 1 via ORAL
  Filled 2015-08-30 (×12): qty 1

## 2015-08-30 MED ORDER — PRO-STAT SUGAR FREE PO LIQD
30.0000 mL | Freq: Two times a day (BID) | ORAL | Status: DC
  Administered 2015-09-02: 30 mL via ORAL
  Filled 2015-08-30 (×3): qty 30

## 2015-08-30 MED ORDER — LEVOTHYROXINE SODIUM 25 MCG PO TABS: 25.0000 ug | ORAL_TABLET | Freq: Every day | ORAL | Status: DC

## 2015-08-30 MED ORDER — SODIUM CHLORIDE 0.9 % IV SOLN
INTRAVENOUS | Status: DC
  Administered 2015-08-30 – 2015-09-01 (×4): via INTRAVENOUS

## 2015-08-30 MED ORDER — MORPHINE SULFATE (PF) 2 MG/ML IV SOLN: 2.0000 mg | INTRAVENOUS | Status: DC | PRN

## 2015-08-30 MED ORDER — PHILLIPS COLON HEALTH PO CAPS: 1.0000 | ORAL_CAPSULE | Freq: Two times a day (BID) | ORAL | Status: DC

## 2015-08-30 MED ORDER — VANCOMYCIN 50 MG/ML ORAL SOLUTION
125.0000 mg | Freq: Four times a day (QID) | ORAL | Status: DC
  Administered 2015-08-30: 125 mg via ORAL
  Filled 2015-08-30 (×9): qty 2.5

## 2015-08-30 MED ORDER — PIPERACILLIN-TAZOBACTAM 3.375 G IVPB
3.3750 g | Freq: Three times a day (TID) | INTRAVENOUS | Status: DC
  Administered 2015-08-30 – 2015-08-31 (×5): 3.375 g via INTRAVENOUS
  Filled 2015-08-30 (×8): qty 50

## 2015-08-30 MED ORDER — BOOST PO LIQD: 90.0000 mL | Freq: Three times a day (TID) | ORAL | Status: DC

## 2015-08-30 MED ORDER — VANCOMYCIN HCL IN DEXTROSE 750-5 MG/150ML-% IV SOLN
750.0000 mg | INTRAVENOUS | Status: DC
  Administered 2015-08-30: 750 mg via INTRAVENOUS
  Filled 2015-08-30 (×2): qty 150

## 2015-08-30 MED ORDER — ACETAMINOPHEN 325 MG PO TABS
650.0000 mg | ORAL_TABLET | Freq: Four times a day (QID) | ORAL | Status: DC | PRN
Start: 2015-08-30 — End: 2015-09-02

## 2015-08-30 MED ORDER — ACETAMINOPHEN 650 MG RE SUPP
650.0000 mg | Freq: Four times a day (QID) | RECTAL | Status: DC | PRN
  Administered 2015-08-30: 650 mg via RECTAL
  Filled 2015-08-30: qty 1

## 2015-08-30 MED ORDER — ENSURE ENLIVE PO LIQD
237.0000 mL | Freq: Two times a day (BID) | ORAL | Status: DC
  Filled 2015-08-30 (×4): qty 237

## 2015-08-30 NOTE — Progress Notes (Signed)
SLP Cancellation Note  Patient Details Name: Ricky [name] MRN: [id number] DOB: [birth year]   Cancelled treatment:       Reason Eval/Treat Not Completed: Patient's level of consciousness. Spoke with pt's daughter, [name].  Hospice is following.  Will return next date to determine if swallow eval is still warranted.    [name] 08/30/2015, 1:45 PM

## 2015-08-30 NOTE — ED Notes (Signed)
Daughter took all pt belongings.

## 2015-08-30 NOTE — Consult Note (Signed)
Consultation Note Date: 08/30/2015   Patient Name: Ricky [name]  DOB: 1917/09/20  MRN: [id number]  Age / Sex: 81 y.o., male   PCP: Ricky Curry, DO Referring Physician: Nita Sells, MD  Reason for Consultation: Establishing goals of care and Psychosocial/spiritual support  Palliative Care Assessment and Plan Summary of Established Goals of Care and Medical Treatment Preferences   Clinical Assessment/Narrative: Ricky [name] looks frail and ill positioned in bed today.  He is contracted and non verbal. No family at bedside at this time.   Call to son Ricky [name] who states his sister Ricky [name] is on the way to the hospital at this time. Ricky [name] states they have talked with [facility] 2-3 times, and saw PCP Monday and that "we know his time is very, very, very, very close".   He tells me the goal is to set up their father at home with Hospice.  I advise if this is very important to them, that we can work to get this done as soon as possible.  Ricky [name] states he would like to meet with his sister and PMT today.  Call to Ricky [name] as she has left the hospital before my visit.  Ricky [name] tells me that Ricky [name] has been admitted with [facility] as of yesterday at 4:30pm, her brother is unaware of this at this time.  She tells me that although her brother has done an excellent job in caring for their father, he will at times "take off" d/t caregiver strain.  Ricky [name] tells me about Ricky [name] vomiting and that she was told that when she had to call 911 she was to also call Hospice.    Ricky [name] tells me her goal is to bring her father back to their home for Hospice services to continue, but she is aware of [name] place if needed.    Contacts/Participants in Discussion: Primary Decision Maker: Ricky [name] is unable to make his own decisions at this time.  HCPOA:  Legal guardianship given to Ricky [name] as of April 2015.  Ricky [name] lives with son Ricky [name] who is primary caregiver.    Code Status/Advance Care Planning:  DNR  MOST form completed June 2016  NO tube feeding; YES IV fluids and antibiotics.   Symptom Management:   Tylenol 650 mg PO/PR Q 6 hours PRN  Morphine 2 mg IV Q 2 hours PRN   Palliative Prophylaxis: none at this time.   Psycho-social/Spiritual:   Support System: Lives with son Ricky [name], daughter Ricky [name] has guardianship.   Desire for further Chaplaincy support: Not discussed today.   Prognosis: < 6 months, likely less especially if unable to stabalize during this hospitalization.   Discharge Planning:  Home with Hospice, Ricky [name] they have spoken with [facility] 2-3 times and their goal is to take him home with hospice, but ata meeting in June, family not willing to take services d/t no IV fluids or antibiotics.    I feel that Ricky [name]'s will likely be a hospital death, dt my concern over families understanding of hospice. .  Guardian/daughter Ricky [name] has entered Ricky [name] into services with [facility], and her goal is to bring Ricky [name] back to his home. She tells me that she is aware of [facility].         Chief Complaint:  Vomiting, weakness History of Present Illness:   Ricky [name] is a 81 y.o. male with a past medical history significant for  advanced dementia, progressive supranuclear palsy/Parkinson's syndrome complicated by FTT, decubitus ulcers and severe PCM, and history of recurrent C. Diff recently recurred again 3 weeks ago who presents with hypothermia.  All history was collected from the daughter as the patient is nonverbal at baseline. The patient was admitted 3 times this summer with altered mental status as a complication of his worsening PSP/Parkinson's. Since the last admission 3 months ago, the patient has lost more weight, and had less by mouth intake. He is wheelchair-bound. He hasn't eaten anything solid in more than 2 weeks. He has had recurrent C. difficile in the past and  was diagnosed 3 weeks ago with this again and started on oral metronidazole, which the family have been unable to get him to swallow.  Last night before bed the patient vomited his Boost liquid. This morning, the patient's daughter found he had vomited several more times in bed. During the day today he was weak and less responsive than normal, so the patient's daughter and CNA brought him to the ER.  In the ED, he had a rectal temperature of 91.91F, placed on bair hugger, and his temperature has normalized.  TRH was asked to admit for sepsis from aspiration pneumonia versus C. difficile.   Primary Diagnoses  Present on Admission:  . Sepsis ([place]) . Hypothyroidism . Adult failure to thrive . Dementia . Supranuclear palsies, progressive ([place]) . Decubitus ulcer of sacral region, unstageable ([place]) . Protein-calorie malnutrition, severe ([place]) . Parkinsonian syndrome [facility])  Palliative Review of Systems: Ricky [name] is unable to participate in ROS as he is non verbal. No s/s of pain or dyspnea.  I have reviewed the medical record, interviewed the patient and family, and examined the patient. The following aspects are pertinent.  Past Medical History  Diagnosis Date  . IBS (irritable bowel syndrome)   . Diverticulosis   . Cataract   . OAB (overactive bladder)   . Macular degeneration   . Hypertension   . Parkinsonian syndrome ([place])   . Syncope, vasovagal   . Dementia due to Parkinson's disease without behavioral disturbance ([place])   . Unspecified transient cerebral ischemia   . Unspecified urinary incontinence   . Hypothyroidism   . Anemia, unspecified   . Depression   . Alzheimer's disease   . Gout, unspecified   . Thoracic or lumbosacral neuritis or radiculitis, unspecified   . Progressive supranuclear palsy ([place])   . Paralysis agitans ([place])   . Pneumonitis due to inhalation of food or vomitus ([place])   . Cellulitis and abscess of oral soft tissues   . Unspecified hypothyroidism    . Pneumonia, organism unspecified   . Pseudomonas infection in conditions classified elsewhere and of unspecified site   . Urinary tract infection, site not specified   . Osteoarthrosis, unspecified whether generalized or localized, unspecified site   . Osteoporosis, unspecified   . Gout, unspecified   . Thoracic or lumbosacral neuritis or radiculitis, unspecified   . Stroke [facility])    Social History   Social History  . Marital Status: Single    Spouse Name: N/A  . Number of Children: N/A  . Years of Education: N/A   Social History Main Topics  . Smoking status: Former Research scientist (life sciences)  . Smokeless tobacco: Never Used     Comment: QUIT SMOKING BACK IN THE LATE 50'S  . Alcohol Use: No  . Drug Use: No  . Sexual Activity: No   Other Topics Concern  . None   Social History  Narrative   Family History  Problem Relation Age of Onset  . Cancer Mother   . Stroke Father   . Liver disease Father   . Kidney disease Brother   . Liver disease Brother   . Emphysema Brother    Scheduled Meds: . vancomycin  125 mg Oral 4 times per day  . [START ON 08/31/2015] vancomycin  750 mg Intravenous Q24H   Continuous Infusions: . sodium chloride 75 mL/hr at 08/30/15 0436  . piperacillin-tazobactam (ZOSYN)  IV 3.375 g (08/30/15 0601)   PRN Meds:.acetaminophen **OR** acetaminophen, morphine injection Medications Prior to Admission:  Prior to Admission medications   Medication Sig Start Date End Date Taking? Authorizing Provider  acetaminophen (TYLENOL) 500 MG tablet Take 1,000 mg by mouth 3 (three) times daily.     Historical Provider, MD  AMBULATORY NON FORMULARY MEDICATION Pressure Air Mattress Dx: G60.8, L89.150 10/30/14   Tiffany L Reed, DO  bacitracin ointment Apply 1 application topically 2 (two) times daily. Applies to head of penis to decrease infection from catheter.    Historical Provider, MD  Calcium Carbonate-Vit D-Min (CALTRATE 600+D PLUS) 600-800 MG-UNIT CHEW Chew 1 tablet by mouth 2  (two) times daily with a meal.     Historical Provider, MD  CRANBERRY SOFT PO Take 2 capsules by mouth 3 (three) times daily.     Historical Provider, MD  Cyanocobalamin (VITAMIN B-12) 5000 MCG SUBL Place 5,000 mcg under the tongue 2 (two) times daily.    Historical Provider, MD  ferrous sulfate 325 (65 FE) MG tablet Take 325 mg by mouth every other day.     Historical Provider, MD  hydrALAZINE (APRESOLINE) 25 MG tablet Take 1/2 tablet by mouth twice daily to control blood pressure Patient taking differently: Take 12.5 mg by mouth 2 (two) times daily. Take 1/2 tablet by mouth twice daily to control blood pressure 11/14/14   Blanchie Serve, MD  lactose free nutrition (BOOST) LIQD Take 90 mLs by mouth 3 (three) times daily between meals. Patient not taking: Reported on 08/27/2015 07/01/14   Barton Dubois, MD  levothyroxine (SYNTHROID, LEVOTHROID) 25 MCG tablet TAKE 1 TABLET BY MOUTH DAILY Patient taking differently: TAKE 25MCG BY MOUTH DAILY 05/14/15   Tiffany L Reed, DO  lidocaine (LIDODERM) 5 % PLACE 3 PATCHES ONTO THE SKIN DAILY. REMOVE & DISCARD PATCH WITHIN 12 HOURS OR AS DIRECTED BY MD Patient not taking: Reported on 08/27/2015 02/16/15   Tiffany L Reed, DO  memantine (NAMENDA) 10 MG tablet Take 10 mg by mouth 2 (two) times daily.    Historical Provider, MD  Multiple Vitamins-Minerals (ICAPS AREDS FORMULA PO) Take 2 capsules by mouth 2 (two) times daily with a meal.     Historical Provider, MD  NON FORMULARY D-Mannose, Take 2 teaspoon by mouth with fluids 2 times a day    Historical Provider, MD  polyethylene glycol (MIRALAX / GLYCOLAX) packet Take 17 g by mouth daily as needed for mild constipation. Patient not taking: Reported on 08/27/2015 07/01/14   Barton Dubois, MD  Probiotic Product (Start) CAPS Take 1 capsule by mouth 2 (two) times daily.    Historical Provider, MD  psyllium (METAMUCIL) 58.6 % packet Take 1 packet by mouth 2 (two) times daily.     Historical Provider, MD   vancomycin (VANCOCIN) 50 mg/mL oral solution 125mg  po qid x 14 days, then bid x 7 days, then daily for 7 days, then every 3 days for 14 days. 08/27/15   Tiffany L  Reed, DO  VOLTAREN 1 % GEL Apply 2 g topically 4 (four) times daily as needed (pain).  07/10/14   Historical Provider, MD   Allergies  Allergen Reactions  . Other Other (See Comments)    Dairy products- cause swallowing difficulty    CBC:    Component Value Date/Time   WBC 4.5 08/30/2015 0405   WBC 6.5 04/27/2015 1144   HGB 11.6* 08/30/2015 0405   HCT 32.5* 08/30/2015 0405   HCT 36.6* 04/27/2015 1144   PLT 121* 08/30/2015 0405   MCV 84.6 08/30/2015 0405   NEUTROABS 4.0 08/29/2015 2335   NEUTROABS 5.1 04/27/2015 1144   LYMPHSABS 0.6* 08/29/2015 2335   LYMPHSABS 0.9 04/27/2015 1144   MONOABS 0.4 08/29/2015 2335   EOSABS 0.0 08/29/2015 2335   EOSABS 0.1 04/13/2014 1538   BASOSABS 0.0 08/29/2015 2335   BASOSABS 0.0 04/27/2015 1144   Comprehensive Metabolic Panel:    Component Value Date/Time   NA 123* 08/30/2015 0405   NA 138 04/13/2014 1538   K 5.5* 08/30/2015 0405   CL 88* 08/30/2015 0405   CO2 22 08/30/2015 0405   BUN 40* 08/30/2015 0405   BUN 14 04/13/2014 1538   CREATININE 1.05 08/30/2015 0405   GLUCOSE 80 08/30/2015 0405   GLUCOSE 86 04/13/2014 1538   CALCIUM 8.9 08/30/2015 0405   AST 41 08/29/2015 2335   ALT 27 08/29/2015 2335   ALKPHOS 54 08/29/2015 2335   BILITOT 0.6 08/29/2015 2335   PROT 5.4* 08/29/2015 2335   PROT 7.6 01/24/2014 1203   ALBUMIN 2.6* 08/29/2015 2335   ALBUMIN 4.0 01/24/2014 1203    Physical Exam: Vital Signs: BP 110/52 mmHg  Pulse 82  Temp(Src) 98.4 F (36.9 C) (Oral)  Resp 23  Ht 6\' 2"  (1.88 m)  Wt 53.7 kg (118 lb 6.2 oz)  BMI 15.19 kg/m2  SpO2 98% SpO2: SpO2: 98 % O2 Device:   O2 Flow Rate:   Intake/output summary: No intake or output data in the 24 hours ending 08/30/15 0952 LBM:   Baseline Weight: Weight: 53.7 kg (118 lb 6.2 oz) Most recent weight: Weight: 53.7  kg (118 lb 6.2 oz)  Exam Findings:  Constitutional:  Elderly, frail, chronically ill appearing.  Resp:  Even and non labored Cardio:  Rate mid 80's at this time.  Abd: unable to fully assess d/t contracture Muscl: Contracted X 4 extremities.           Palliative Performance Scale: 20%              Additional Data Reviewed: Recent Labs     08/29/15  2335  08/30/15  0405  WBC  5.0  4.5  HGB  11.8*  11.6*  PLT  112*  121*  NA  122*  123*  BUN  39*  40*  CREATININE  1.04  1.05     Time In: 0850 Time Out: 1020 Time Total: 90 minutes Greater than 50%  of this time was spent counseling and coordinating care related to the above assessment and plan.  Signed by: Drue Novel, NP  Drue Novel, NP  08/30/2015, 9:52 AM  Please contact Palliative Medicine Team phone at [phone] for questions and concerns.

## 2015-08-30 NOTE — ED Notes (Signed)
Bair hugger taken off of pt.

## 2015-08-30 NOTE — Progress Notes (Signed)
Initial Nutrition Assessment  DOCUMENTATION CODES:   Severe malnutrition in context of chronic illness, Underweight  INTERVENTION:  Provide Boost Breeze po TID, each supplement provides 250 kcal and 9 grams of protein Provide 30 Pro-stat BID, each supplement provides 100 kcal and 15 grams of protein  NUTRITION DIAGNOSIS:   Malnutrition related to chronic illness as evidenced by severe depletion of body fat, severe depletion of muscle mass.   GOAL:   Other (Comment), Patient will meet greater than or equal to 90% of their needs (Monitor for goals of care and comfort goals)   MONITOR:   PO intake, Supplement acceptance, Diet advancement, Labs, Weight trends, Skin  REASON FOR ASSESSMENT:   Consult Assessment of nutrition requirement/status (Patient with supranuclear palsy, poor swallow)  ASSESSMENT:   81 y.o. male with a past medical history significant for advanced dementia, progressive supranuclear palsy/Parkinson's syndrome complicated by FTT, decubitus ulcers and severe PCM, and history of recurrent C. Diff recently recurred again 3 weeks ago who presents with hypothermia.  Pt asleep and curled up at time of visit. No family present at time of visit. Unable to visualize face or hands but, severe wasting was observed in shoulders, back, arms, and legs. Pt is severely underweight. Per chart, supplements are contraindicated at this time due to patient not being alert enough to take PO's. Per chart, dairy products cause swallowing difficulty. Per previous nutrition notes, pt does better with Boost breeze supplements than milk protein based Ensure/Boost supplements.  Hospice is following.   Labs: low sodium, elevated potassium, low chloride  Diet Order:  Diet full liquid Room service appropriate?: Yes; Fluid consistency:: Thin  Skin:  Reviewed, no issues per nursing notes; decubitus ulcers per MD note  Last BM:  PTA  Height:   Ht Readings from Last 1 Encounters:  08/30/15  6\' 2"  (1.88 m)    Weight:   Wt Readings from Last 1 Encounters:  08/30/15 118 lb 6.2 oz (53.7 kg)    Ideal Body Weight:  190 kg  BMI:  Body mass index is 15.19 kg/(m^2).  Estimated Nutritional Needs:   Kcal:  1600-1800  Protein:  70-80 grams  Fluid:  1.6 L/day  EDUCATION NEEDS:   No education needs identified at this time  [place], LDN Inpatient Clinical Dietitian Pager: [phone] After Hours Pager: [phone]

## 2015-08-30 NOTE — ED Notes (Signed)
Attempted report 

## 2015-08-30 NOTE — H&P (Addendum)
History and Physical  Ricky [name]  TXM:[id number]  DOB: 16-Sep-1917  DOA: 08/29/2015  Referring physician: Antony Blackbird, MD PCP: Hollace Kinnier, DO   Chief Complaint: Vomiting, weakness  HPI: Ricky [name] is a 81 y.o. male with a past medical history significant for advanced dementia, progressive supranuclear palsy/Parkinson's syndrome complicated by FTT, decubitus ulcers and severe PCM, and history of recurrent C. Diff recently recurred again 3 weeks ago who presents with hypothermia.  All history was collected from the daughter as the patient is nonverbal at baseline. The patient was admitted 3 times this summer with altered mental status as a complication of his worsening PSP/Parkinson's. Since the last admission 3 months ago, the patient has lost more weight, and had less by mouth intake. He is wheelchair-bound. He hasn't eaten anything solid in more than 2 weeks. He has had recurrent C. difficile in the past and was diagnosed 3 weeks ago with this again and started on oral metronidazole, which the family have been unable to get him to swallow.  Last night before bed the patient vomited his Boost liquid.  This morning, the patient's daughter found he had vomited several more times in bed. During the day today he was weak and less responsive than normal, so the patient's daughter and CNA brought him to the ER.  In the ED, he had a rectal temperature of 91.47F. TR[facility] was asked to admit for sepsis from aspiration pneumonia versus C. difficile.   Review of Systems:  Patient seen 1:26 AM on 08/30/2015. Unable to obtain due to patient mentation.  Past Medical History  Diagnosis Date  . IBS (irritable bowel syndrome)   . Diverticulosis   . Cataract   . OAB (overactive bladder)   . Macular degeneration   . Hypertension   . Parkinsonian syndrome ([place])   . Syncope, vasovagal   . Dementia due to Parkinson's disease without behavioral disturbance ([place])   . Unspecified transient  cerebral ischemia   . Unspecified urinary incontinence   . Hypothyroidism   . Anemia, unspecified   . Depression   . Alzheimer's disease   . Gout, unspecified   . Thoracic or lumbosacral neuritis or radiculitis, unspecified   . Progressive supranuclear palsy ([place])   . Paralysis agitans ([place])   . Pneumonitis due to inhalation of food or vomitus ([place])   . Cellulitis and abscess of oral soft tissues   . Unspecified hypothyroidism   . Pneumonia, organism unspecified   . Pseudomonas infection in conditions classified elsewhere and of unspecified site   . Urinary tract infection, site not specified   . Osteoarthrosis, unspecified whether generalized or localized, unspecified site   . Osteoporosis, unspecified   . Gout, unspecified   . Thoracic or lumbosacral neuritis or radiculitis, unspecified   . Stroke [facility])   The above past medical history was reviewed from the chart as the patient is unable to answer.  Past Surgical History  Procedure Laterality Date  . Esophagogastroduodenoscopy    . Eye surgery  2006    LEFT CATARACT  . Esophagogastroduodenoscopy N/A 04/24/2015    Procedure: ESOPHAGOGASTRODUODENOSCOPY (EGD);  Surgeon: Carol Ada, MD;  Location: [facility] ENDOSCOPY;  Service: Endoscopy;  Laterality: N/A;  The above surgical history was reviewed was reviewed from the chart as the patient is unable to answer.  Social History: Patient lives with his son who is his primary caregiver. He is from [place] originally. He was formally a Print production planner. He is a former smoker.  Allergies  Allergen Reactions  . Other Other (See Comments)    Dairy products- cause swallowing difficulty     Family History  Problem Relation Age of Onset  . Cancer Mother   . Stroke Father   . Liver disease Father   . Kidney disease Brother   . Liver disease Brother   . Emphysema Brother     Prior to Admission medications   Medication Sig Start Date End Date Taking? Authorizing Provider    acetaminophen (TYLENOL) 500 MG tablet Take 1,000 mg by mouth 3 (three) times daily.     Historical Provider, MD  AMBULATORY NON FORMULARY MEDICATION Pressure Air Mattress Dx: G60.8, L89.150 10/30/14   Tiffany L Reed, DO  bacitracin ointment Apply 1 application topically 2 (two) times daily. Applies to head of penis to decrease infection from catheter.    Historical Provider, MD  Calcium Carbonate-Vit D-Min (CALTRATE 600+D PLUS) 600-800 MG-UNIT CHEW Chew 1 tablet by mouth 2 (two) times daily with a meal.     Historical Provider, MD  CRANBERRY SOFT PO Take 2 capsules by mouth 3 (three) times daily.     Historical Provider, MD  Cyanocobalamin (VITAMIN B-12) 5000 MCG SUBL Place 5,000 mcg under the tongue 2 (two) times daily.    Historical Provider, MD  ferrous sulfate 325 (65 FE) MG tablet Take 325 mg by mouth every other day.     Historical Provider, MD  hydrALAZINE (APRESOLINE) 25 MG tablet Take 1/2 tablet by mouth twice daily to control blood pressure Patient taking differently: Take 12.5 mg by mouth 2 (two) times daily. Take 1/2 tablet by mouth twice daily to control blood pressure 11/14/14   Blanchie Serve, MD  lactose free nutrition (BOOST) LIQD Take 90 mLs by mouth 3 (three) times daily between meals. Patient not taking: Reported on 08/27/2015 07/01/14   Barton Dubois, MD  levothyroxine (SYNTHROID, LEVOTHROID) 25 MCG tablet TAKE 1 TABLET BY MOUTH DAILY Patient taking differently: TAKE 25MCG BY MOUTH DAILY 05/14/15   Tiffany L Reed, DO  lidocaine (LIDODERM) 5 % PLACE 3 PATCHES ONTO THE SKIN DAILY. REMOVE & DISCARD PATCH WITHIN 12 HOURS OR AS DIRECTED BY MD Patient not taking: Reported on 08/27/2015 02/16/15   Tiffany L Reed, DO  memantine (NAMENDA) 10 MG tablet Take 10 mg by mouth 2 (two) times daily.    Historical Provider, MD  Multiple Vitamins-Minerals (ICAPS AREDS FORMULA PO) Take 2 capsules by mouth 2 (two) times daily with a meal.     Historical Provider, MD  NON FORMULARY D-Mannose, Take 2  teaspoon by mouth with fluids 2 times a day    Historical Provider, MD  polyethylene glycol (MIRALAX / GLYCOLAX) packet Take 17 g by mouth daily as needed for mild constipation. Patient not taking: Reported on 08/27/2015 07/01/14   Barton Dubois, MD  Probiotic Product ([place]) CAPS Take 1 capsule by mouth 2 (two) times daily.    Historical Provider, MD  psyllium (METAMUCIL) 58.6 % packet Take 1 packet by mouth 2 (two) times daily.     Historical Provider, MD  vancomycin (VANCOCIN) 50 mg/mL oral solution 125mg  po qid x 14 days, then bid x 7 days, then daily for 7 days, then every 3 days for 14 days. 08/27/15   Tiffany L Reed, DO  VOLTAREN 1 % GEL Apply 2 g topically 4 (four) times daily as needed (pain).  07/10/14   Historical Provider, MD    Physical Exam: BP 148/60 mmHg  Pulse 59  Temp(Src) 91.3 F (32.9 C) (Rectal)  Resp 21  SpO2 95% General appearance: Dramatically cachectic male, in contractures, does not respond to voice or noxious stimuli.   Eyes: No discharge. Does not open eyes. The left eye is drooping. There is some swelling over the left eye.   Nose: No deformity, discharge, or epistaxis.   Mouth: The lower lip is sagging. Does not open mouth. The mucous membranes appear dry.    Lymph: No cervical, supraclavicular or axillary lymphadenopathy. Skin: Decubitus ulcer on the upper left shoulder bandaged. Decubitus ulcer on the sacrum. Skin warm under Bair hugger.  Cardiac: Bradycardic, no murmurs appreciated.  Respiratory: Poor inspiratory effort.  No crackles appreciated. Abdomen: No BS present.  Emaciated and without rigidity or distension.    Neuro: Contractures.  GCS 5, for occasional contracted posture.        Labs on Admission:  The metabolic panel is notable for hyperkalemia, low bicarbonate, hyponatremia. The renal function is stable. The lactic acid is 4.8 mmol/L The complete blood count is notable for chronic stable normocytic anemia, and no elevation  in WBC. There is no transaminitis or elevation in bilirubin. The albumin is 2.6 g/dL.   Radiological Exams on Admission: Personally reviewed: Dg Chest 1 View  08/29/2015  CLINICAL DATA:  81 year old male with altered mental status, hypothermia and in hospice care. EXAM: CHEST 1 VIEW COMPARISON:  05/31/2015 and prior radiographs FINDINGS: Cardiomegaly identified. Mild bibasilar atelectasis again noted. There is no evidence of focal airspace disease, pulmonary edema, suspicious pulmonary nodule/mass, pleural effusion, or pneumothorax. No acute bony abnormalities are identified. IMPRESSION: Cardiomegaly and mild basilar atelectasis. Electronically Signed   By: Margarette Canada M.D.   On: 08/29/2015 23:23        Assessment/Plan  1. Sepsis from C. difficile versus HCAP:  This is new.  This is causing hypothermia, metabolic acidosis, hyperkalemia and hyponatremia. It is not clear at this time if it causes untreated C. difficile or aspiration pneumonia. The family's goals of care are conflicted. The patient's son would like trial of treatment.  -Vancomycin by mouth if able -Intravenous vancomycin and piperacillin tazobactam for HCHP in the setting of aspiration -Repeat lactic acid -IV fluids  2. Progressive supranuclear palsy and dementia, located by [facility] and failure to thrive:  The patient's condition has progressed severely. The daughter has discussed with her geriatrician hospice enrollment in our on board as of today. -Consult palliative care -Consult to social work for hospice coordination -Nutrition consult  3. Hyperkalemia:  Likely secondary to sepsis. -Fluid resuscitation and recheck tomorrow.  4. Hyponatremia:  Likely secondary to sepsis. -Fluid resuscitation and recheck tomorrow      DVT PPx: None due to frailty and risk of bleeding from GI tract, which patient has had happen before Diet: Full liquid as able Consultants: Palliative care and Social work Code Status:  DNR Family Communication: The patient's diagnosis, prognosis, and expected treatments overnight were discussed with the daughter. I explained that I would continue antibiotics started in the emergency room and that we would reevaluate the patient tomorrow but that it seemed unlikely given the patient's overall trajectory that this would affect his life span in a substantial way. I explained we would involve social work and palliative care in order to coordinate hospice. Patient's CODE STATUS was confirmed with the daughter.  Disposition Plan:  At the time of admission, it appears that the appropriate admission status for this patient is INPATIENT. This is judged to be reasonable and necessary in  order to provide the required intensity of service to ensure the patient's safety given the presenting symptoms, physical exam findings, and initial radiographic and laboratory data in the context of their chronic comorbidities.  Together, these circumstances are felt to place her/him at high risk for further clinical deterioration threatening life, limb, or organ.     Edwin Dada [facility] Pager [phone]    Addendum: The patient has failed a bedside swallow evaluation and his Boost, probiotic and low dose levothyroxine have been held.  Levothyroxine should be restarted if the patient is able to regain enough awareness to take by mouth.  Vancomycin has also been held.  I will defer PR vancomycin; it is my clinical judgment that this would not reasonably extend the patient's life, and would compromise his dignity and comfort.

## 2015-08-30 NOTE — Progress Notes (Signed)
ANTIBIOTIC CONSULT NOTE - INITIAL  Pharmacy Consult for Vancocin and Zosyn Indication: rule out sepsis  Allergies  Allergen Reactions  . Other Other (See Comments)    Dairy products- cause swallowing difficulty     Patient Measurements: Height: 6\' 2"  (188 cm) Weight: 118 lb 6.2 oz (53.7 kg) IBW/kg (Calculated) : 82.2  Vital Signs: Temp: 91.3 F (32.9 C) (10/12 2202) Temp Source: Rectal (10/12 2202) BP: 148/60 mmHg (10/13 0045) Pulse Rate: 59 (10/13 0045)  Labs:  Recent Labs  08/29/15 2335  WBC 5.0  HGB 11.8*  PLT 112*  CREATININE 1.04   Estimated Creatinine Clearance: 32.3 mL/min (by C-G formula based on Cr of 1.04).   Microbiology: Recent Results (from the past 720 hour(s))  C. difficile, PCR     Status: Abnormal   Collection Time: 08/13/15  3:49 PM  Result Value Ref Range Status   Toxigenic C Difficile by pcr Positive (A) Negative Final    Comment: Toxigenic C difficile: Positive Epidemic Strain Bl/NAP1/027: Presumptive Negative     Medical History: Past Medical History  Diagnosis Date  . IBS (irritable bowel syndrome)   . Diverticulosis   . Cataract   . OAB (overactive bladder)   . Macular degeneration   . Hypertension   . Parkinsonian syndrome ([place])   . Syncope, vasovagal   . Dementia due to Parkinson's disease without behavioral disturbance ([place])   . Unspecified transient cerebral ischemia   . Unspecified urinary incontinence   . Hypothyroidism   . Anemia, unspecified   . Depression   . Alzheimer's disease   . Gout, unspecified   . Thoracic or lumbosacral neuritis or radiculitis, unspecified   . Progressive supranuclear palsy ([place])   . Paralysis agitans ([place])   . Pneumonitis due to inhalation of food or vomitus ([place])   . Cellulitis and abscess of oral soft tissues   . Unspecified hypothyroidism   . Pneumonia, organism unspecified   . Pseudomonas infection in conditions classified elsewhere and of unspecified site   . Urinary tract  infection, site not specified   . Osteoarthrosis, unspecified whether generalized or localized, unspecified site   . Osteoporosis, unspecified   . Gout, unspecified   . Thoracic or lumbosacral neuritis or radiculitis, unspecified   . Stroke [facility])      Assessment: 81yo male dx'd w/ Cdiff last week has not been able to tolerate tx, now w/ AMS and functional decline, concern for sepsis, to begin IV ABX.  Goal of Therapy:  Vancomycin trough level 15-20 mcg/ml  Plan:  Vanc 1500mg  and Zosyn 3.375g IV given in ED; will continue with vancomycin 750mg  IV Q24H and Zosyn 3.375g IV Q8H and monitor CBC, Cx, levels prn.  Wynona Neat, PharmD, BCPS  08/30/2015,1:40 AM

## 2015-08-30 NOTE — ED Notes (Addendum)
Pt placed on 2L [place] for continued o2 sats at 90%. Pt repositioned for sleeping.

## 2015-08-30 NOTE — Clinical Social Work Note (Signed)
CSW Consult Acknowledged:   CSW received a consult for hospice placement. CSW notified that Palliative Care has been consulted. CSW will allow Palliative Care to meet with the pt/family to discuss goals of care before the CSW discuss a discharge plan.   [place], MSW, [place]

## 2015-08-30 NOTE — Progress Notes (Signed)
Claiborne Billings from [facility] called to verify the correct billing for Mr Utsey since he was recently admitted under hospice care. CM talked with [name] at [facility] and they verified that the patient was recently admitted with them and that the diagnosis he is under is covered under their service. Claiborne Billings with [facility]  Medicare notified. CM spoke with the billing department in order to have the correct insurance billed.

## 2015-08-30 NOTE — ED Notes (Signed)
Urine sample collected from foley bag.

## 2015-08-30 NOTE — Progress Notes (Signed)
  3:07 PM I agree with HPI/GPe and A/P per Dr. Loleta Books       Patient seen briefly and examined Barely responsive but apparently has improved compared to prior No diarrhea per RN We will follow with [facility] [place] and with [facility] medicine recommendations Suspect very poor overall prognosis For now contineu broad spectrum vanc/zosyn coverage unlieky cdiff but follow labs in am rest as per hpi  Patient Active Problem List   Diagnosis Date Noted  . Encounter for palliative care   . GI bleed 04/25/2015  . Dementia 04/22/2015  . Iron deficiency anemia 04/22/2015  . DVT of upper extremity (deep vein thrombosis) ([place])   . Palliative care encounter 04/13/2015  . DNR (do not resuscitate) discussion 04/13/2015  . Dysphagia, pharyngoesophageal phase 04/13/2015  . Adult failure to thrive 04/13/2015  . Pressure ulcer 04/12/2015  . AKI (acute kidney injury) ([place]) 04/06/2015  . Decreased level of consciousness   . Dehydration   . Shingles 03/31/2015  . UTI (lower urinary tract infection) 03/25/2015  . SOB (shortness of breath)   . Urinary tract infection due to [place] 01/14/2015  . Supranuclear palsies, progressive ([place]) 01/10/2015  . Hematuria 01/10/2015  . Rectal bleeding 06/28/2014  . Physical deconditioning 03/31/2014  . Sepsis ([place]) 03/22/2014  . Supranuclear palsy 03/22/2014  . Decubitus ulcer of sacral region, unstageable ([place]) 03/22/2014  . Hypokalemia 03/22/2014  . Hyponatremia 03/22/2014  . Protein-calorie malnutrition, severe ([place]) 03/21/2014  . HCAP (healthcare-associated pneumonia) 03/20/2014  . Fever 03/20/2014  . Metabolic encephalopathy, acute 03/20/2014  . Pulmonary edema 03/20/2014  . Weakness 03/09/2014  . Fall 03/09/2014  . Acute encephalopathy 01/24/2014  . CKD (chronic kidney disease), stage II 01/16/2014  . Anemia, chronic disease 01/16/2014  . Elevated INR 01/15/2014  . Gross hematuria 01/15/2014  . Hyperlipidemia 01/15/2014  . Achilles tendonitis  05/26/2013  . Toxic encephalopathy, infectious versus progressive supranuclear palsy 05/15/2013  . FTT (failure to thrive) in adult 01/27/2013  . Dysphagia 01/27/2013  . BPH (benign prostatic hyperplasia) 10/19/2012  . CVA (cerebral infarction) 06/22/2012  . Left-sided weakness 06/20/2012  . Constipation, chronic 02/17/2012  . Alzheimer disease   . Hypertension   . Parkinsonian syndrome ([place])   . Syncope, vasovagal   . Syncope 11/12/2011  . Rectal prolapse 09/18/2011  . Hypothyroidism 09/24/2009  . Gout, unspecified 09/24/2009  . [place] DISEASE 09/24/2009  . Gastroparesis 09/24/2009  . History of vasovagal syncope 09/24/2009  . PULMONARY EMBOLISM, HX OF 09/24/2009

## 2015-08-30 NOTE — Progress Notes (Signed)
Inpatient [facility] 5 M 17 [facility] RN Visit. GIP related admission to [facility] DX: Progressive Supranuclear Palsy. Pt. Is a DNR code status. Pt. Seen sleeping in room curled into a fetal position. Resps regular and unlabored. O2 on @ 2 L [place]. Pt's hands are cool to touch and lower extremities are warm. No family at the bedside. Pt. does not arouse to his name or to touch. [facility] will continue to follow daily. Medication list placed on shadow chart. Please call with any hospice needs.  [facility] Liaison [phone].

## 2015-08-31 LAB — CBC WITH DIFFERENTIAL/PLATELET
BASOS ABS: 0 10*3/uL (ref 0.0–0.1)
BASOS PCT: 0 %
Eosinophils Absolute: 0 10*3/uL (ref 0.0–0.7)
Eosinophils Relative: 0 %
HEMATOCRIT: 28.5 % — AB (ref 39.0–52.0)
Hemoglobin: 10 g/dL — ABNORMAL LOW (ref 13.0–17.0)
Lymphocytes Relative: 14 %
Lymphs Abs: 0.6 10*3/uL — ABNORMAL LOW (ref 0.7–4.0)
MCH: 29.4 pg (ref 26.0–34.0)
MCHC: 35.1 g/dL (ref 30.0–36.0)
MCV: 83.8 fL (ref 78.0–100.0)
MONO ABS: 0.3 10*3/uL (ref 0.1–1.0)
Monocytes Relative: 8 %
NEUTROS ABS: 3.4 10*3/uL (ref 1.7–7.7)
NEUTROS PCT: 78 %
Platelets: 111 10*3/uL — ABNORMAL LOW (ref 150–400)
RBC: 3.4 MIL/uL — ABNORMAL LOW (ref 4.22–5.81)
RDW: 13.5 % (ref 11.5–15.5)
WBC: 4.3 10*3/uL (ref 4.0–10.5)

## 2015-08-31 LAB — BASIC METABOLIC PANEL
ANION GAP: 7 (ref 5–15)
BUN: 37 mg/dL — ABNORMAL HIGH (ref 6–20)
CALCIUM: 8.5 mg/dL — AB (ref 8.9–10.3)
CO2: 25 mmol/L (ref 22–32)
Chloride: 97 mmol/L — ABNORMAL LOW (ref 101–111)
Creatinine, Ser: 0.86 mg/dL (ref 0.61–1.24)
GLUCOSE: 59 mg/dL — AB (ref 65–99)
Potassium: 3.9 mmol/L (ref 3.5–5.1)
Sodium: 129 mmol/L — ABNORMAL LOW (ref 135–145)

## 2015-08-31 MED ORDER — VANCOMYCIN 50 MG/ML ORAL SOLUTION
125.0000 mg | Freq: Four times a day (QID) | ORAL | Status: DC
  Administered 2015-08-31 – 2015-09-02 (×9): 125 mg via ORAL
  Filled 2015-08-31 (×11): qty 2.5

## 2015-08-31 NOTE — Progress Notes (Signed)
Inpatient [facility] 5 M 17 [facility] RN Visit. GIP related admission to [facility] DX: Progressive Supranuclear Palsy. Pt. Is a DNR code status. Pt. sleeping on his back with with his contractures of hands and arms resting against his upper chest. O2 on at @ 2L [place]. Pt. opened his eyes briefly as Probation officer was talking with him and appeared to nod to a question. He does not respond to touch nor is verbal. No family at the beside and staff RN reports not seeing any family today. Writer talked with [facility] LCSW, Benedict Needy who will be talking with his family today to assess what the family plans for discharge and to  ensure that the pt's needs are being met. [facility] will continue to follow daily. Please call with any hospice needs.   [facility] Liaison [phone]

## 2015-08-31 NOTE — Progress Notes (Signed)
Utilization review completed. Laranda Burkemper, RN, BSN. 

## 2015-08-31 NOTE — Progress Notes (Signed)
SLP Cancellation Note  Patient Details Name: Ricky [name] MRN: [id number] DOB: May 17, 1917   Cancelled treatment:       Reason Eval/Treat Not Completed: Patient's level of consciousness   [name] 08/31/2015, 10:25 AM

## 2015-08-31 NOTE — Progress Notes (Addendum)
RM 5M 17   Ricky [name]                            [facility] [place] MSW Note: ° °Pt appeared to be resting comfortably. Hospice SW and Chaplain met with dtr/POA to discuss pt's status and inquire about disposition. She expressed that she and her brother have seen pt in a similar condition and pt pulled through. She wants to give pt a few days to see if the antibiotics will work to perk pt up. If so, the goal is to have him return home with hospice homecare. She will supplement his care at home with hired help if needed. However, it pt declines over the weekend, she would prefer for pt to go to [facility] for EOL care. Discussed the cost and provided her with the [facility] financial. Her hope is to have pt remain in the hospital over the weekend for continued tx. If pt remains hospitalized, SW will follow up on Monday for her decision and to extend support.Informed RN liaison Lisa Strandberg of all of the aforementioned. Trucia Truesdale,LCSW °

## 2015-08-31 NOTE — Progress Notes (Signed)
Ricky [name] ZJI:[id number] DOB: 02-Nov-1917 DOA: 08/29/2015 PCP: Hollace Kinnier, DO  Brief narrative:  81 y/o ? Recent admission 7/14-7/17/16 TME vs progressive supranuclear palsy Prog supranuclear palsy [end stage] H/o recurrent Pyelonephritis 2/2 to need for chr Foley for incomplete bladder emptying End stage Adult FTT  Patient was on the verge of being admitted to hospice and had recently been started on by mouth vancomycin for concerns of C. difficile  Admitted to ED Ricky [name] with acute hyponatremia 122, hypothermia 91 and concerns for sepsis  Past medical history-As per Problem list Chart reviewed as below-   Consultants:  Pallaitive  Procedures:  none  Antibiotics:  Vancomcycin 10/13   Subjective  Lethargic but more responsive Daughter in room shows me a picture of patient 2 weeks ago sitting up feeding himself Tolerating no fluids as yet Nonverbal but opens eyes   Objective    Interim History:   Telemetry:    Objective: Filed Vitals:   08/31/15 0104 08/31/15 0629 08/31/15 1003 08/31/15 1411  BP: 109/65 119/76 134/80 133/90  Pulse: 77 76 59 58  Temp: 97.5 F (36.4 C) 98 F (36.7 C) 98.2 F (36.8 C) 97.4 F (36.3 C)  TempSrc: Axillary Axillary Axillary Axillary  Resp: 16 16 18 18   Height:      Weight:      SpO2: 91% 98% 100% 100%    Intake/Output Summary (Last 24 hours) at 08/31/15 1720 Last data filed at 08/31/15 0500  Gross per 24 hour  Intake      0 ml  Output    950 ml  Net   -950 ml    Exam:  General: Mucosa dry but more responsive than yesterday Cardiovascular: S1-S2 no murmur rub or gallop Respiratory: Clinically clear no added sound Abdomen: Soft nontender nondistended no rebound no guarding Skin no lower extremity edema Neuro contracture flexures cannot assess fully  Data Reviewed: Basic Metabolic Panel:  Recent Labs Lab 08/29/15 2335 08/30/15 0405 08/31/15 0941  NA 122* 123* 129*  K 5.4* 5.5* 3.9  CL 89*  88* 97*  CO2 18* 22 25  GLUCOSE 90 80 59*  BUN 39* 40* 37*  CREATININE 1.04 1.05 0.86  CALCIUM 8.7* 8.9 8.5*   Liver Function Tests:  Recent Labs Lab 08/29/15 2335  AST 41  ALT 27  ALKPHOS 54  BILITOT 0.6  PROT 5.4*  ALBUMIN 2.6*   No results for input(s): LIPASE, AMYLASE in the last 168 hours. No results for input(s): AMMONIA in the last 168 hours. CBC:  Recent Labs Lab 08/29/15 2335 08/30/15 0405 08/31/15 0941  WBC 5.0 4.5 4.3  NEUTROABS 4.0  --  3.4  HGB 11.8* 11.6* 10.0*  HCT 32.8* 32.5* 28.5*  MCV 83.9 84.6 83.8  PLT 112* 121* 111*   Cardiac Enzymes: No results for input(s): CKTOTAL, CKMB, CKMBINDEX, TROPONINI in the last 168 hours. BNP: Invalid input(s): POCBNP CBG: No results for input(s): GLUCAP in the last 168 hours.  No results found for this or any previous visit (from the past 240 hour(s)).   Studies:              All Imaging reviewed and is as per above notation   Scheduled Meds: . feeding supplement  1 Container Oral TID BM  . feeding supplement (PRO-STAT SUGAR FREE 64)  30 mL Oral BID  . vancomycin  125 mg Oral 4 times per day   Continuous Infusions: . sodium chloride 75 mL/hr at 08/30/15 2012  Assessment/Plan: 1. Toxic metabolic encephalopathy-potentially secondary to volume depletion with this acute hyponatremia secondary to poor by mouth solid intake. It appears that his mental status is better and he has been hydrated with 75 cc of saline. We will await to see how things progress over the next 24-48 hours and I indicated this to the family 2. Sepsis secondary to   Possible C. Difficile?-He has not had a stool since admission but with his medications and incomplete therapy [daughter says he only took 7 doses of this], I will change him to by mouth vancomycin only. This x-ray is not suggestive of pneumonia nor is there any other concern for source at present time-no urine culture was performed on admission but once again I would hold off  on broad-spectrum aand see clinical response in the morning 3. Progressive supranuclear palsy and adult failure to thrive-very poor overall prognosis family seems to have some idea and has elected for hospice however they are challenged by the fact that he rebounds and has been rebounding past 2 years appreciate palliative care input. Overall status is very poor. 4.  acute hyponatremia -continue saline at a slightly slow rate of 50 cc per.  Suspect this may have had a role in patient's somnolence as well as decreased mentation. 5. Severe kyphosis + contractures-at baseline   Code Status: DO NOT RESUSCITATE however family is hopeful for some recovery. See notes from palliative care. We will keep the patient and patient to see over the next 2448 hrs. if he spikes a fever or has any other concerns. If so we would workup and extend sepsis workup to include urine culture as he has a history of the same.  Expect discharge home versus to freestanding hospice Family Communication: Long discussion with daughter at the bedside Disposition Plan: See above discussion    [name], MD  [facility] Pager [phone] 08/31/2015, 5:20 PM    LOS: 1 day

## 2015-09-01 LAB — CBC WITH DIFFERENTIAL/PLATELET
Basophils Absolute: 0 10*3/uL (ref 0.0–0.1)
Basophils Relative: 0 %
EOS PCT: 0 %
Eosinophils Absolute: 0 10*3/uL (ref 0.0–0.7)
HCT: 30.3 % — ABNORMAL LOW (ref 39.0–52.0)
Hemoglobin: 10.4 g/dL — ABNORMAL LOW (ref 13.0–17.0)
LYMPHS ABS: 1 10*3/uL (ref 0.7–4.0)
LYMPHS PCT: 19 %
MCH: 29.2 pg (ref 26.0–34.0)
MCHC: 34.3 g/dL (ref 30.0–36.0)
MCV: 85.1 fL (ref 78.0–100.0)
MONO ABS: 0.3 10*3/uL (ref 0.1–1.0)
MONOS PCT: 6 %
Neutro Abs: 4 10*3/uL (ref 1.7–7.7)
Neutrophils Relative %: 75 %
PLATELETS: 113 10*3/uL — AB (ref 150–400)
RBC: 3.56 MIL/uL — ABNORMAL LOW (ref 4.22–5.81)
RDW: 13.6 % (ref 11.5–15.5)
WBC: 5.3 10*3/uL (ref 4.0–10.5)

## 2015-09-01 LAB — BASIC METABOLIC PANEL
Anion gap: 3 — ABNORMAL LOW (ref 5–15)
BUN: 40 mg/dL — AB (ref 6–20)
CHLORIDE: 105 mmol/L (ref 101–111)
CO2: 24 mmol/L (ref 22–32)
Calcium: 8 mg/dL — ABNORMAL LOW (ref 8.9–10.3)
Creatinine, Ser: 0.72 mg/dL (ref 0.61–1.24)
GFR calc Af Amer: >60 mL/min (ref >60)
GFR calc non Af Amer: >60 mL/min (ref >60)
GLUCOSE: 53 mg/dL — AB (ref 65–99)
POTASSIUM: 4.1 mmol/L (ref 3.5–5.1)
Sodium: 132 mmol/L — ABNORMAL LOW (ref 135–145)

## 2015-09-01 NOTE — Evaluation (Signed)
Clinical/Bedside Swallow Evaluation Patient Details  Name: Ricky [name] MRN: [id number] Date of Birth: 1917/08/22  Today's Date: 09/01/2015 Time: SLP Start Time (ACUTE ONLY): 1332 SLP Stop Time (ACUTE ONLY): 1342 SLP Time Calculation (min) (ACUTE ONLY): 10 min  Past Medical History:  Past Medical History  Diagnosis Date  . IBS (irritable bowel syndrome)   . Diverticulosis   . Cataract   . OAB (overactive bladder)   . Macular degeneration   . Hypertension   . Parkinsonian syndrome ([place])   . Syncope, vasovagal   . Dementia due to Parkinson's disease without behavioral disturbance ([place])   . Unspecified transient cerebral ischemia   . Unspecified urinary incontinence   . Hypothyroidism   . Anemia, unspecified   . Depression   . Alzheimer's disease   . Gout, unspecified   . Thoracic or lumbosacral neuritis or radiculitis, unspecified   . Progressive supranuclear palsy ([place])   . Paralysis agitans ([place])   . Pneumonitis due to inhalation of food or vomitus ([place])   . Cellulitis and abscess of oral soft tissues   . Unspecified hypothyroidism   . Pneumonia, organism unspecified   . Pseudomonas infection in conditions classified elsewhere and of unspecified site   . Urinary tract infection, site not specified   . Osteoarthrosis, unspecified whether generalized or localized, unspecified site   . Osteoporosis, unspecified   . Gout, unspecified   . Thoracic or lumbosacral neuritis or radiculitis, unspecified   . Stroke [facility])    Past Surgical History:  Past Surgical History  Procedure Laterality Date  . Esophagogastroduodenoscopy    . Eye surgery  2006    LEFT CATARACT  . Esophagogastroduodenoscopy N/A 04/24/2015    Procedure: ESOPHAGOGASTRODUODENOSCOPY (EGD);  Surgeon: Carol Ada, MD;  Location: [facility] ENDOSCOPY;  Service: Endoscopy;  Laterality: N/A;   HPI:  81 y.o. male with a past medical history significant for advanced dementia, progressive supranuclear palsy/Parkinson's  syndrome complicated by FTT, decubitus ulcers and severe PCM, and history of recurrent C. Diff recently recurred 3 weeks ago who presents with hypothermia.  Three admissions this summer due to complications from worsening PSP/Parkinson's.  Has not eaten solid foods for two weeks; admitted with vomiting, MS changes.  Family has been in discussion with hospice, and Palliative Medicine has seen pt this morning.  Per notes, goal is to send Ricky [name] home with hospice if that is feasible.  Pt is known to SLP services, who have followed him for dysphagia since 2013.     Assessment / Plan / Recommendation Clinical Impression  Followed-up per daughter, Ricky [name]'s, request.  Pt a little more alert, responsive to voice today, opened eyes and demonstrated awareness/anticipation of liquids.  When seating in upright position, pt able to consume several sips of Breeze using a straw - swallow was delayed, and there was immediate, wet cough elicited after 50% of trials, which were limited.  Oral suctioning provided to remove remaining residue.  Assessment was discontinued; HOB gently lowered and pt repositioned for comfort.  Pt has had progressive dysphagia with inherent risk of aspiration; he appeared to be overtly aspirating at bedside today.  Hospice is following.  If GOC are consistent with comfort POs, please elevate HOB, provide careful hand-feeding of liquids, and hold tray when coughing or showing signs of discomfort.  There is little else SLP can offer at this time; our service will sign off.    Aspiration Risk  Severe    Diet Recommendation  (diet per MD/hospice )  Other  Recommendations Oral Care Recommendations: Oral care QID     Swallow Study Prior Functional Status       General Other Pertinent Information: 81 y.o. male with a past medical history significant for advanced dementia, progressive supranuclear palsy/Parkinson's syndrome complicated by FTT, decubitus ulcers and severe PCM, and  history of recurrent C. Diff recently recurred 3 weeks ago who presents with hypothermia.  Three admissions this summer due to complications from worsening PSP/Parkinson's.  Has not eaten solid foods for two weeks; admitted with vomiting, MS changes.  Family has been in discussion with hospice, and Palliative Medicine has seen pt this morning.  Per notes, goal is to send Ricky [name] home with hospice if that is feasible.  Pt is known to SLP services, who have followed him for dysphagia since 2013.   Type of Study: Bedside swallow evaluation Previous Swallow Assessment: May 2016 Diet Prior to this Study:  (full liquids) Temperature Spikes Noted: No Respiratory Status: Supplemental O2 delivered via (comment) History of Recent Intubation: No Self-Feeding Abilities: Total assist Patient Positioning: Upright in bed Baseline Vocal Quality: Not observed Volitional Cough: Cognitively unable to elicit Volitional Swallow: Unable to elicit    Oral/Motor/Sensory Function Overall Oral Motor/Sensory Function: Impaired at baseline   Ice Chips Ice chips: Not tested   Thin Liquid Thin Liquid: Impaired Presentation: Straw Oral Phase Impairments: Reduced labial seal;Reduced lingual movement/coordination Oral Phase Functional Implications: Prolonged oral transit Pharyngeal  Phase Impairments: Suspected delayed Swallow;Multiple swallows;Cough - Delayed    Nectar Thick Nectar Thick Liquid: Not tested   Honey Thick Honey Thick Liquid: Not tested   Puree Puree: Not tested   Solid  Ricky [name] L. [place], [place] CCC/SLP Pager [phone]     Solid: Not tested       Ricky [name] 09/01/2015,1:48 PM

## 2015-09-01 NOTE — Progress Notes (Signed)
Inpatient [facility] M 17 [facility] RN Visit. GIP related admission to [facility] DX: Progressive Supranuclear Palsy. Pt. Is a DNR code status. Patient sleeping on his back but opens his eyes to his name. His uneaten breakfast tray is on the table beside him. No family has been in this morning.Pt. Remains nonverbal but does appear comfortable. Resps regular and extremities are warm to the touch. [facility] will continue to see him daily to assess for any discharge needs. Please call with any hospice questions.  [facility] Liaison [phone].

## 2015-09-01 NOTE — Progress Notes (Signed)
PRENTICE SACKRIDER MLY:[id number] DOB: [birth year] DOA: 08/29/2015 PCP: Hollace Kinnier, DO  Brief narrative:  81 y/o ? Recent admission 7/14-7/17/16 TME vs progressive supranuclear palsy Prog supranuclear palsy [end stage] H/o recurrent Pyelonephritis 2/2 to need for chr Foley for incomplete bladder emptying End stage Adult FTT  Patient was on the verge of being admitted to hospice and had recently been started on by mouth vancomycin for concerns of C. difficile  Ad[facility] to [facility] with acute hyponatremia 122, hypothermia 91 and concerns for sepsis  Past medical history-As per Problem list Chart reviewed as below-   Consultants:  Pallaitive  Procedures:  none  Antibiotics:  Vancomcycin 10/13   Subjective   Nonverbal but opens eyes Refusing diet to a degree   Objective    Interim History:   Telemetry:    Objective: Filed Vitals:   09/01/15 0105 09/01/15 0457 09/01/15 0857 09/01/15 1408  BP: 123/70 112/72  123/76  Pulse: 60 66 65 60  Temp: 97.6 F (36.4 C) 97.4 F (36.3 C)  97.8 F (36.6 C)  TempSrc: Axillary Axillary Axillary Axillary  Resp: 17 16 16 16   Height:      Weight:      SpO2: 100% 100% 100% 93%    Intake/Output Summary (Last 24 hours) at 09/01/15 1532 Last data filed at 09/01/15 1042  Gross per 24 hour  Intake 3629.17 ml  Output    950 ml  Net 2679.17 ml    Exam:  General: Mucosa dry but more responsive than yesterday-slightly swollen L side of face Cardiovascular: S1-S2 no murmur rub or gallop Respiratory: Clinically clear no added sound Abdomen: Soft nontender nondistended no rebound no guarding Skin no lower extremity edema Neuro contracture flexures cannot assess fully  Data Reviewed: Basic Metabolic Panel:  Recent Labs Lab 08/29/15 2335 08/30/15 0405 08/31/15 0941 09/01/15 1000  NA 122* 123* 129* 132*  K 5.4* 5.5* 3.9 4.1  CL 89* 88* 97* 105  CO2 18* 22 25 24   GLUCOSE 90 80 59* 53*  BUN 39* 40* 37* 40*    CREATININE 1.04 1.05 0.86 0.72  CALCIUM 8.7* 8.9 8.5* 8.0*   Liver Function Tests:  Recent Labs Lab 08/29/15 2335  AST 41  ALT 27  ALKPHOS 54  BILITOT 0.6  PROT 5.4*  ALBUMIN 2.6*   No results for input(s): LIPASE, AMYLASE in the last 168 hours. No results for input(s): AMMONIA in the last 168 hours. CBC:  Recent Labs Lab 08/29/15 2335 08/30/15 0405 08/31/15 0941 09/01/15 1000  WBC 5.0 4.5 4.3 5.3  NEUTROABS 4.0  --  3.4 4.0  HGB 11.8* 11.6* 10.0* 10.4*  HCT 32.8* 32.5* 28.5* 30.3*  MCV 83.9 84.6 83.8 85.1  PLT 112* 121* 111* 113*   Cardiac Enzymes: No results for input(s): CKTOTAL, CKMB, CKMBINDEX, TROPONINI in the last 168 hours. BNP: Invalid input(s): POCBNP CBG: No results for input(s): GLUCAP in the last 168 hours.  No results found for this or any previous visit (from the past 240 hour(s)).   Studies:              All Imaging reviewed and is as per above notation   Scheduled Meds: . feeding supplement  1 Container Oral TID BM  . feeding supplement (PRO-STAT SUGAR FREE 64)  30 mL Oral BID  . vancomycin  125 mg Oral 4 times per day   Continuous Infusions:     Assessment/Plan: 1. Toxic metabolic encephalopathy- secondary to volume depletion with this  acute hyponatremia secondary to poor by mouth solid intake. Saline has helped-saline lock to see if can maintain hydration/mentation 2. Sepsis secondary to   Possible C. Difficile?-He has not had a stool since admission but with his medications and incomplete therapy [daughter says he only took 7 doses of this], I will change him to by mouth vancomycin only.  x-ray - suggestive of pneumonia nor is there any other concern for source at present time-no urine culture.  hold off on broad-spectrum abxs to have some idea and has elected for hospice however they are challenged by the fact that he rebounds and has been rebounding past 2 years appreciate palliative care input. Overall status is very poor. 3.  acute  hyponatremia -.  Suspect this may have had a role in patient's somnolence as well as decreased mentation.  Resolved to 132 today 4. Severe kyphosis + contractures-at baseline   Code Status: DO NOT RESUSCITATE however family is hopeful for some recovery--Hospice on d/c home to follow Family Communication: Long discussion with daughter at the bedside Disposition Plan: See above discussion    [name], MD  [facility] Pager [phone] 09/01/2015, 3:32 PM    LOS: 2 days

## 2015-09-02 DIAGNOSIS — T17998D Other foreign object in respiratory tract, part unspecified causing other injury, subsequent encounter: Secondary | ICD-10-CM

## 2015-09-02 DIAGNOSIS — T17908A Unspecified foreign body in respiratory tract, part unspecified causing other injury, initial encounter: Secondary | ICD-10-CM | POA: Insufficient documentation

## 2015-09-02 LAB — BASIC METABOLIC PANEL
ANION GAP: 6 (ref 5–15)
BUN: 37 mg/dL — ABNORMAL HIGH (ref 6–20)
CHLORIDE: 104 mmol/L (ref 101–111)
CO2: 25 mmol/L (ref 22–32)
Calcium: 8.6 mg/dL — ABNORMAL LOW (ref 8.9–10.3)
Creatinine, Ser: 0.56 mg/dL — ABNORMAL LOW (ref 0.61–1.24)
Glucose, Bld: 65 mg/dL (ref 65–99)
POTASSIUM: 3.7 mmol/L (ref 3.5–5.1)
SODIUM: 135 mmol/L (ref 135–145)

## 2015-09-02 MED ORDER — VANCOMYCIN 50 MG/ML ORAL SOLUTION: ORAL | Status: AC

## 2015-09-02 NOTE — Progress Notes (Signed)
CM spoke with Harmon Pier from [facility] and has requested pt to go home by ambulance transport; states Vanc will be covered by hospice; CM spoke with CSW who will arrange for ambulance transport after 16:00 today.  No other CM needs were communicated.

## 2015-09-02 NOTE — Progress Notes (Signed)
Inpatient 5M17 - [facility] SW Note. GIP admission, diagnosis progressive supranuclear palsy. Code Status: DNR.  Chart reviewed early am. Plan for discharge noted. Spoke with RN multiple times and RNCM to coordinate return home today. Spoke with daughter [name] by phone per request of [facility] triage RN. Confirmed with [name] request for patient to transfer at or after 1600 so family can get room ready. Confirmed with [name] that she and her brother feel patient has returned to baseline and do not wish to have home 02 at this time. They report they monitor constantly and patient normally sats in mid 90's on RA. Confirmed with clinical resource that PO vanc is covered. Discharge summary faxed.   Please contact [facility] at [phone] for hospice questions or needs.   Erling Conte, [place]

## 2015-09-02 NOTE — Discharge Summary (Addendum)
Physician Discharge Summary  Ricky [name]:[id number] DOB: 21-Sep-1917 DOA: 08/29/2015  PCP: Hollace Kinnier, DO  Admit date: 08/29/2015 Discharge date: 09/02/2015  Time spent: 30 minutes  Recommendations for Outpatient Follow-up:  1. recommend Hospice follow and delineate Goals of care  2. Will d/c home with Hospice 3. Would minimize meds on d/c as likely trjectory is EOL 4. Has Decubiti as well as foot wound which needs attention-would not work up or debride further as intent of care is pallaitve  Discharge Diagnoses:  Principal Problem:   Sepsis ([place]) Active Problems:   Hypothyroidism   Parkinsonian syndrome ([place])   Protein-calorie malnutrition, severe ([place])   Decubitus ulcer of sacral region, unstageable ([place])   Supranuclear palsies, progressive ([place])   Adult failure to thrive   Dementia   Encounter for palliative care   Discharge Condition: gaurded  Diet recommendation: liquid diet  Filed Weights   08/30/15 0100  Weight: 53.7 kg (118 lb 6.2 oz)    History of present illness:  81 y/o ? Recent admission 7/14-7/17/16 TME vs progressive supranuclear palsy Prog supranuclear palsy [end stage] H/o recurrent Pyelonephritis 2/2 to need for chr Foley for incomplete bladder emptying End stage Adult FTT  Patient was on the verge of being admitted to hospice and had recently been started on by mouth vancomycin for concerns of C. difficile  Ad[facility] to [facility] with acute hyponatremia 122, hypothermia 91 and concerns for sepsis  Hospital Course:   1. Toxic metabolic encephalopathy + fever- secondary to volume depletion with this acute hyponatremia secondary to poor by mouth solid intake. Fever could also be from dehydration.  Saline has helped-bmet is pending from this am but regardless of result would d/c to home with Hospice today 2. Sepsis secondary to Possible C. Difficile?-He has not had a stool since admission but with his medications and incomplete therapy  [daughter says he only took 7 doses of this], I will change him to by mouth vancomycin only--[employer] coverage might be an issue and I will ask HH to ensure that this medication can be covered. no PNA nor concern for UTI. initially on broad-spectrum abxs which were d/cto have some idea and has elected for hospice however they are challenged by the fact that he rebounds and has been rebounding past 2 years appreciate palliative care input. Overall status is very poor-family seems to be coming to terms with this but outlook is still optimisitc 3. acute hyponatremia -. Suspect this may have had a role in patient's somnolence as well as decreased mentation. Resolved to 132 today--recheck BMET pending from this am 4. Severe kyphosis + contractures-at baseline    Discharge Exam: Filed Vitals:   09/02/15 0518  BP: 115/82  Pulse: 59  Temp: 97.6 F (36.4 C)  Resp: 15    General: more alert, opens eyes, still non-verbal Cardiovascular: s1 s 2no m/r/g Respiratory: clear but poor exam today Skin-breakdown over bottom noted near anal area and another stage ii on R Ischium  Discharge Instructions    Current Discharge Medication List    CONTINUE these medications which have CHANGED   Details  vancomycin (VANCOCIN) 50 mg/mL oral solution 125mg  po qid x 14 days, then bid x 7 days, then daily for 7 days, then every 3 days for 14 days. Qty: 120 mL, Refills: 3   Associated Diagnoses: Recurrent colitis due to Clostridium difficile      CONTINUE these medications which have NOT CHANGED   Details  acetaminophen (TYLENOL) 500 MG  tablet Take 1,000 mg by mouth 3 (three) times daily.     guaiFENesin (MUCINEX) 600 MG 12 hr tablet Take 1,200 mg by mouth 2 (two) times daily as needed for cough.    levothyroxine (SYNTHROID, LEVOTHROID) 25 MCG tablet TAKE 1 TABLET BY MOUTH DAILY Qty: 90 tablet, Refills: 1    Melatonin 5 MG/15ML LIQD Take 5 mg by mouth at bedtime as needed. 20 minutes before bed time     AMBULATORY NON FORMULARY MEDICATION Pressure Air Mattress Dx: G60.8, L89.150 Qty: 1 each, Refills: 0    bacitracin ointment Apply 1 application topically 2 (two) times daily. Applies to head of penis to decrease infection from catheter.    CRANBERRY SOFT PO Take 2 capsules by mouth 3 (three) times daily.     ferrous sulfate 325 (65 FE) MG tablet Take 325 mg by mouth every other day.     hydrALAZINE (APRESOLINE) 25 MG tablet Take 1/2 tablet by mouth twice daily to control blood pressure Qty: 30 tablet, Refills: 6    lactose free nutrition (BOOST) LIQD Take 90 mLs by mouth 3 (three) times daily between meals. Refills: 0    memantine (NAMENDA) 10 MG tablet Take 10 mg by mouth 2 (two) times daily.    NON FORMULARY D-Mannose, Take 1 teaspoon by mouth with fluids 2 times a day    polyethylene glycol (MIRALAX / GLYCOLAX) packet Take 17 g by mouth daily as needed for mild constipation.    Probiotic Product ([place]) CAPS Take 1 capsule by mouth 2 (two) times daily.    VOLTAREN 1 % GEL Apply 2 g topically 4 (four) times daily as needed (pain).       STOP taking these medications     Calcium Carbonate-Vit D-Min (CALTRATE 600+D PLUS) 600-800 MG-UNIT CHEW      Cyanocobalamin (VITAMIN B-12) 5000 MCG SUBL      lidocaine (LIDODERM) 5 %      Multiple Vitamins-Minerals (ICAPS AREDS FORMULA PO)        Allergies  Allergen Reactions  . Other Other (See Comments)    Dairy products- cause swallowing difficulty       The results of significant diagnostics from this hospitalization (including imaging, microbiology, ancillary and laboratory) are listed below for reference.    Significant Diagnostic Studies: Dg Chest 1 View  08/29/2015  CLINICAL DATA:  81 year old male with altered mental status, hypothermia and in hospice care. EXAM: CHEST 1 VIEW COMPARISON:  05/31/2015 and prior radiographs FINDINGS: Cardiomegaly identified. Mild bibasilar atelectasis again noted.  There is no evidence of focal airspace disease, pulmonary edema, suspicious pulmonary nodule/mass, pleural effusion, or pneumothorax. No acute bony abnormalities are identified. IMPRESSION: Cardiomegaly and mild basilar atelectasis. Electronically Signed   By: Margarette Canada M.D.   On: 08/29/2015 23:23    Microbiology: No results found for this or any previous visit (from the past 240 hour(s)).   Labs: Basic Metabolic Panel:  Recent Labs Lab 08/29/15 2335 08/30/15 0405 08/31/15 0941 09/01/15 1000  NA 122* 123* 129* 132*  K 5.4* 5.5* 3.9 4.1  CL 89* 88* 97* 105  CO2 18* 22 25 24   GLUCOSE 90 80 59* 53*  BUN 39* 40* 37* 40*  CREATININE 1.04 1.05 0.86 0.72  CALCIUM 8.7* 8.9 8.5* 8.0*   Liver Function Tests:  Recent Labs Lab 08/29/15 2335  AST 41  ALT 27  ALKPHOS 54  BILITOT 0.6  PROT 5.4*  ALBUMIN 2.6*   No results for input(s): LIPASE,  AMYLASE in the last 168 hours. No results for input(s): AMMONIA in the last 168 hours. CBC:  Recent Labs Lab 08/29/15 2335 08/30/15 0405 08/31/15 0941 09/01/15 1000  WBC 5.0 4.5 4.3 5.3  NEUTROABS 4.0  --  3.4 4.0  HGB 11.8* 11.6* 10.0* 10.4*  HCT 32.8* 32.5* 28.5* 30.3*  MCV 83.9 84.6 83.8 85.1  PLT 112* 121* 111* 113*   Cardiac Enzymes: No results for input(s): CKTOTAL, CKMB, CKMBINDEX, TROPONINI in the last 168 hours. BNP: BNP (last 3 results) No results for input(s): BNP in the last 8760 hours.  ProBNP (last 3 results) No results for input(s): PROBNP in the last 8760 hours.  CBG: No results for input(s): GLUCAP in the last 168 hours.     SignedNita Sells  [facility] 09/02/2015, 9:31 AM

## 2015-09-05 ENCOUNTER — Emergency Department (HOSPITAL_COMMUNITY)

## 2015-09-05 ENCOUNTER — Inpatient Hospital Stay (HOSPITAL_COMMUNITY)
Admission: EM | Admit: 2015-09-05 | Discharge: 2015-09-18 | DRG: 640 | Disposition: E | Attending: Internal Medicine | Admitting: Internal Medicine

## 2015-09-05 ENCOUNTER — Encounter (HOSPITAL_COMMUNITY): Payer: Self-pay | Admitting: Emergency Medicine

## 2015-09-05 DIAGNOSIS — G20 Parkinson's disease: Secondary | ICD-10-CM | POA: Diagnosis present

## 2015-09-05 DIAGNOSIS — G9340 Encephalopathy, unspecified: Secondary | ICD-10-CM | POA: Diagnosis present

## 2015-09-05 DIAGNOSIS — G309 Alzheimer's disease, unspecified: Secondary | ICD-10-CM | POA: Diagnosis not present

## 2015-09-05 DIAGNOSIS — E039 Hypothyroidism, unspecified: Secondary | ICD-10-CM | POA: Diagnosis present

## 2015-09-05 DIAGNOSIS — D696 Thrombocytopenia, unspecified: Secondary | ICD-10-CM | POA: Diagnosis present

## 2015-09-05 DIAGNOSIS — G608 Other hereditary and idiopathic neuropathies: Secondary | ICD-10-CM | POA: Diagnosis not present

## 2015-09-05 DIAGNOSIS — Z66 Do not resuscitate: Secondary | ICD-10-CM | POA: Insufficient documentation

## 2015-09-05 DIAGNOSIS — Z79899 Other long term (current) drug therapy: Secondary | ICD-10-CM

## 2015-09-05 DIAGNOSIS — G231 Progressive supranuclear ophthalmoplegia [Steele-Richardson-Olszewski]: Secondary | ICD-10-CM | POA: Diagnosis present

## 2015-09-05 DIAGNOSIS — E162 Hypoglycemia, unspecified: Secondary | ICD-10-CM | POA: Diagnosis not present

## 2015-09-05 DIAGNOSIS — Z515 Encounter for palliative care: Secondary | ICD-10-CM | POA: Diagnosis present

## 2015-09-05 DIAGNOSIS — F0280 Dementia in other diseases classified elsewhere without behavioral disturbance: Secondary | ICD-10-CM | POA: Diagnosis present

## 2015-09-05 DIAGNOSIS — Z8673 Personal history of transient ischemic attack (TIA), and cerebral infarction without residual deficits: Secondary | ICD-10-CM | POA: Diagnosis not present

## 2015-09-05 DIAGNOSIS — D649 Anemia, unspecified: Secondary | ICD-10-CM | POA: Diagnosis present

## 2015-09-05 DIAGNOSIS — I10 Essential (primary) hypertension: Secondary | ICD-10-CM | POA: Diagnosis present

## 2015-09-05 DIAGNOSIS — R64 Cachexia: Secondary | ICD-10-CM | POA: Diagnosis present

## 2015-09-05 DIAGNOSIS — R4182 Altered mental status, unspecified: Secondary | ICD-10-CM | POA: Diagnosis present

## 2015-09-05 DIAGNOSIS — Z87891 Personal history of nicotine dependence: Secondary | ICD-10-CM

## 2015-09-05 DIAGNOSIS — R627 Adult failure to thrive: Principal | ICD-10-CM | POA: Diagnosis present

## 2015-09-05 LAB — I-STAT CHEM 8, ED
BUN: 65 mg/dL — AB (ref 6–20)
CHLORIDE: 105 mmol/L (ref 101–111)
Calcium, Ion: 1.18 mmol/L (ref 1.13–1.30)
Creatinine, Ser: 1.1 mg/dL (ref 0.61–1.24)
Glucose, Bld: 61 mg/dL — ABNORMAL LOW (ref 65–99)
HEMATOCRIT: 34 % — AB (ref 39.0–52.0)
Hemoglobin: 11.6 g/dL — ABNORMAL LOW (ref 13.0–17.0)
POTASSIUM: 4.6 mmol/L (ref 3.5–5.1)
SODIUM: 139 mmol/L (ref 135–145)
TCO2: 23 mmol/L (ref 0–100)

## 2015-09-05 LAB — CBC WITH DIFFERENTIAL/PLATELET
BASOS ABS: 0 10*3/uL (ref 0.0–0.1)
Basophils Relative: 0 %
EOS PCT: 0 %
Eosinophils Absolute: 0 10*3/uL (ref 0.0–0.7)
HEMATOCRIT: 30.3 % — AB (ref 39.0–52.0)
Hemoglobin: 10.4 g/dL — ABNORMAL LOW (ref 13.0–17.0)
LYMPHS ABS: 1.2 10*3/uL (ref 0.7–4.0)
LYMPHS PCT: 14 %
MCH: 29.7 pg (ref 26.0–34.0)
MCHC: 34.3 g/dL (ref 30.0–36.0)
MCV: 86.6 fL (ref 78.0–100.0)
MONO ABS: 0.3 10*3/uL (ref 0.1–1.0)
MONOS PCT: 4 %
NEUTROS ABS: 6.9 10*3/uL (ref 1.7–7.7)
Neutrophils Relative %: 82 %
PLATELETS: 75 10*3/uL — AB (ref 150–400)
RBC: 3.5 MIL/uL — ABNORMAL LOW (ref 4.22–5.81)
RDW: 14.5 % (ref 11.5–15.5)
WBC: 8.4 10*3/uL (ref 4.0–10.5)

## 2015-09-05 LAB — CBG MONITORING, ED
GLUCOSE-CAPILLARY: 164 mg/dL — AB (ref 65–99)
GLUCOSE-CAPILLARY: 46 mg/dL — AB (ref 65–99)
Glucose-Capillary: 87 mg/dL (ref 65–99)

## 2015-09-05 MED ORDER — DEXTROSE 50 % IV SOLN: 1.0000 | Freq: Once | INTRAVENOUS | Status: DC

## 2015-09-05 MED ORDER — DEXTROSE 50 % IV SOLN
1.0000 | Freq: Once | INTRAVENOUS | Status: AC
  Administered 2015-09-05: 50 mL via INTRAVENOUS
  Filled 2015-09-05: qty 50

## 2015-09-05 NOTE — ED Notes (Signed)
Pt presents from HOME with [facility] for symptoms of failure to thrive; EMS reports hypotension, hypoglycemia, decreased PO intake; pts daughter lives in another state and communicates to patient through home video and noticed he looks "dehydrated" so she called 911; pt is a DNR; initial CBG with EMS was 31- 25g D50 given which brought glucose up to 207;

## 2015-09-05 NOTE — ED Notes (Signed)
CBG: 46 

## 2015-09-05 NOTE — ED Provider Notes (Addendum)
CSN: [id number]     Arrival date & time 09/12/2015  2001 History   First MD Initiated Contact with Patient 08/31/2015 2003     Chief Complaint  Patient presents with  . Hypotension  . Hypoglycemia     (Consider location/radiation/quality/duration/timing/severity/associated sxs/prior Treatment) HPI Comments: Patient is a 81 year old male with multiple medical problems who was just discharged from the hospital 6 days ago with hospice care for sepsis, severe dehydration, decubitus ulcers and to develop end-of-life care.  EMS was called tonight because patient was unresponsive. His daughter is the POA who does not live here but sees him through video thought he may be dehydrated and called EMS. Patient's son who is not currently present states that he has not been eating or drinking well since getting back home. Patient is a DO NOT RESUSCITATE but unclear what his end-of-life care plan is at this time. He will barely open his eyes when saying his name and gives no history.  No family is present. Patient was found to be hypotensive and hypoglycemic upon EMS arrival. He was given 25 mg of D50 and a small saline bolus proven in symptoms  The history is provided by the EMS personnel. The history is limited by the condition of the patient and the absence of a caregiver.    Past Medical History  Diagnosis Date  . IBS (irritable bowel syndrome)   . Diverticulosis   . Cataract   . OAB (overactive bladder)   . Macular degeneration   . Hypertension   . Parkinsonian syndrome ([place])   . Syncope, vasovagal   . Dementia due to Parkinson's disease without behavioral disturbance ([place])   . Unspecified transient cerebral ischemia   . Unspecified urinary incontinence   . Hypothyroidism   . Anemia, unspecified   . Depression   . Alzheimer's disease   . Gout, unspecified   . Thoracic or lumbosacral neuritis or radiculitis, unspecified   . Progressive supranuclear palsy ([place])   . Paralysis agitans ([place])   .  Pneumonitis due to inhalation of food or vomitus ([place])   . Cellulitis and abscess of oral soft tissues   . Unspecified hypothyroidism   . Pneumonia, organism unspecified   . Pseudomonas infection in conditions classified elsewhere and of unspecified site   . Urinary tract infection, site not specified   . Osteoarthrosis, unspecified whether generalized or localized, unspecified site   . Osteoporosis, unspecified   . Gout, unspecified   . Thoracic or lumbosacral neuritis or radiculitis, unspecified   . Stroke [facility])    Past Surgical History  Procedure Laterality Date  . Esophagogastroduodenoscopy    . Eye surgery  2006    LEFT CATARACT  . Esophagogastroduodenoscopy N/A 04/24/2015    Procedure: ESOPHAGOGASTRODUODENOSCOPY (EGD);  Surgeon: Carol Ada, MD;  Location: [facility] ENDOSCOPY;  Service: Endoscopy;  Laterality: N/A;   Family History  Problem Relation Age of Onset  . Cancer Mother   . Stroke Father   . Liver disease Father   . Kidney disease Brother   . Liver disease Brother   . Emphysema Brother    Social History  Substance Use Topics  . Smoking status: Former Research scientist (life sciences)  . Smokeless tobacco: Never Used     Comment: QUIT SMOKING BACK IN THE LATE 50'S  . Alcohol Use: No    Review of Systems  Unable to perform ROS: Patient unresponsive      Allergies  Other  Home Medications   Prior to Admission medications   Medication  Sig Start Date End Date Taking? Authorizing Provider  acetaminophen (TYLENOL) 500 MG tablet Take 1,000 mg by mouth 3 (three) times daily.     Historical Provider, MD  AMBULATORY NON FORMULARY MEDICATION Pressure Air Mattress Dx: G60.8, L89.150 10/30/14   Tiffany L Reed, DO  bacitracin ointment Apply 1 application topically 2 (two) times daily. Applies to head of penis to decrease infection from catheter.    Historical Provider, MD  CRANBERRY SOFT PO Take 2 capsules by mouth 3 (three) times daily.     Historical Provider, MD  ferrous sulfate 325 (65 FE) MG  tablet Take 325 mg by mouth every other day.     Historical Provider, MD  guaiFENesin (MUCINEX) 600 MG 12 hr tablet Take 1,200 mg by mouth 2 (two) times daily as needed for cough.    Historical Provider, MD  hydrALAZINE (APRESOLINE) 25 MG tablet Take 1/2 tablet by mouth twice daily to control blood pressure Patient taking differently: Take 12.5 mg by mouth 2 (two) times daily. Take 1/2 tablet by mouth twice daily to control blood pressure 11/14/14   Blanchie Serve, MD  lactose free nutrition (BOOST) LIQD Take 90 mLs by mouth 3 (three) times daily between meals. Patient taking differently: Take 90 mLs by mouth 2 (two) times daily between meals.  07/01/14   Barton Dubois, MD  levothyroxine (SYNTHROID, LEVOTHROID) 25 MCG tablet TAKE 1 TABLET BY MOUTH DAILY Patient taking differently: TAKE 25MCG BY MOUTH DAILY 05/14/15   Tiffany L Reed, DO  Melatonin 5 MG/15ML LIQD Take 5 mg by mouth at bedtime as needed. 20 minutes before bed time    Historical Provider, MD  memantine (NAMENDA) 10 MG tablet Take 10 mg by mouth 2 (two) times daily.    Historical Provider, MD  NON FORMULARY D-Mannose, Take 1 teaspoon by mouth with fluids 2 times a day    Historical Provider, MD  polyethylene glycol (MIRALAX / GLYCOLAX) packet Take 17 g by mouth daily as needed for mild constipation. Patient taking differently: Take 17 g by mouth every other day.  07/01/14   Barton Dubois, MD  Probiotic Product ([place]) CAPS Take 1 capsule by mouth 2 (two) times daily.    Historical Provider, MD  vancomycin (VANCOCIN) 50 mg/mL oral solution 125mg  po qid x 14 days, then bid x 7 days, then daily for 7 days, then every 3 days for 14 days. 09/02/15   Nita Sells, MD  VOLTAREN 1 % GEL Apply 2 g topically 4 (four) times daily as needed (pain).  07/10/14   Historical Provider, MD   BP 115/81 mmHg  Pulse 58  Resp 26  SpO2 92% Physical Exam  Constitutional: He appears well-developed and well-nourished. No distress.  HENT:   Head: Normocephalic and atraumatic.  Mouth/Throat: Mucous membranes are dry.  Eyes: Conjunctivae and EOM are normal. Pupils are equal, round, and reactive to light.  Neck: Normal range of motion. Neck supple.  Cardiovascular: Normal rate and intact distal pulses.  An irregularly irregular rhythm present.  No murmur heard. Pulmonary/Chest: Effort normal. Bradypnea noted. No respiratory distress. He has decreased breath sounds. He has no wheezes. He has no rales.  Poor respiratory effort  Abdominal: Soft. He exhibits no distension. There is no tenderness. There is no rebound and no guarding.  Musculoskeletal: Normal range of motion. He exhibits no edema or tenderness.  Decubitus ulcers over the sacrum. Ulcers present over bilateral hands. 3+ pitting edema of the bilateral lower extremities up to the midshin.  Patient has severe kyphosis  Neurological:  Patient is unresponsive. He will barely open his eyes when calling his voice. He does not follow commands.  Skin: Skin is warm and dry. No rash noted. No erythema.  Nursing note and vitals reviewed.   ED Course  Procedures (including critical care time) Labs Review Labs Reviewed  I-STAT CHEM 8, ED - Abnormal; Notable for the following:    BUN 65 (*)    Glucose, Bld 61 (*)    Hemoglobin 11.6 (*)    HCT 34.0 (*)    All other components within normal limits  CBC WITH DIFFERENTIAL/PLATELET    Imaging Review Dg Chest Port 1 View  08/27/2015  CLINICAL DATA:  81 year old male with hypotension and failure to thrive. EXAM: PORTABLE CHEST 1 VIEW COMPARISON:  08/29/2015 FINDINGS: Cardiomegaly is grossly stable. Development of retrocardiac opacity, with obscuration of left hemidiaphragm. Left apex obscured by patient's chin, patient contracted. Minimal atelectasis at the right lung base, unchanged. No pulmonary edema. The bones are under mineralized. IMPRESSION: Development retrocardiac opacity with obscuration of left hemidiaphragm. Atelectasis,  pneumonia, or aspiration, with possible small left pleural effusion. Electronically Signed   By: Jeb Levering M.D.   On: 08/21/2015 21:01   I have personally reviewed and evaluated these images and lab results as part of my medical decision-making.   EKG Interpretation   Date/Time:  Wednesday September 05 2015 20:33:27 EDT Ventricular Rate:  110 PR Interval:    QRS Duration: 98 QT Interval:  303 QTC Calculation: 410 R Axis:   -57 Text Interpretation:  Atrial fibrillation Ventricular tachycardia,  unsustained Aberrant complex Left axis deviation Low voltage, precordial  leads Borderline repolarization abnormality Minimal ST elevation, lateral  leads No significant change since last tracing Confirmed by Maryan Rued  MD,  Loree Fee ([id number]) on 09/03/2015 8:56:25 PM      MDM   Final diagnoses:  Hypoglycemia  Failure to thrive in adult    Patient is a 81 year old gentleman with multiple medical issues who went home with hospice 6 days ago and is returning today by EMS for hypotension and hypoglycemia. Since being home it sounds per report the patient has not been eating or drinking. Patient's daughter is the POA and she called EMS feeling that he was most likely dehydrated. Patient had a blood sugar of 30 which responded to D50 and blood pressure responding to IV fluids. Unclear what patient's end-of-life care plan is at this time. He is a DO NOT RESUSCITATE but unclear if he is no IV fluids or intervention.  We'll do minimal testing at this time. CBC, chem 8 pending. EKG is unchanged with atrial fibrillation chest x-ray with a new retrocardiac opacity which could be either atelectasis, pneumonia or aspiration.  11:12 PM Labs of mild elevation of creatinine but no evidence of renal failure at this time. Hypoglycemia is recurrent. Speaking with patient's daughter and son daughter feels that she needs to at least try (son feels that we are just prolonging his father's life however daughter is  POA. Discussed with the daughter that we are just prolonging the inevitable by giving him sugar. However she lives in [place] and is not here right now. She felt that she needed to call 911 tonight because she was not here and that he was dehydrated. Discussed with her that if he is not eating or drinking giving him fluid and sugar is just repeating the cycle over and over. Discussed with her admitting him to palliative care so that  a defined plan can be outlined in the morning  Blanchie Dessert, MD 08/23/2015 2317  Blanchie Dessert, MD 08/18/2015 2336

## 2015-09-06 ENCOUNTER — Encounter (HOSPITAL_COMMUNITY): Payer: Self-pay | Admitting: Internal Medicine

## 2015-09-06 DIAGNOSIS — G309 Alzheimer's disease, unspecified: Secondary | ICD-10-CM

## 2015-09-06 DIAGNOSIS — E162 Hypoglycemia, unspecified: Secondary | ICD-10-CM | POA: Insufficient documentation

## 2015-09-06 DIAGNOSIS — R627 Adult failure to thrive: Principal | ICD-10-CM

## 2015-09-06 DIAGNOSIS — Z515 Encounter for palliative care: Secondary | ICD-10-CM

## 2015-09-06 DIAGNOSIS — G9340 Encephalopathy, unspecified: Secondary | ICD-10-CM

## 2015-09-06 DIAGNOSIS — G608 Other hereditary and idiopathic neuropathies: Secondary | ICD-10-CM

## 2015-09-06 DIAGNOSIS — Z66 Do not resuscitate: Secondary | ICD-10-CM

## 2015-09-06 MED ORDER — ENOXAPARIN SODIUM 40 MG/0.4ML ~~LOC~~ SOLN
40.0000 mg | Freq: Every day | SUBCUTANEOUS | Status: DC
  Administered 2015-09-06: 40 mg via SUBCUTANEOUS
  Filled 2015-09-06: qty 0.4

## 2015-09-06 MED ORDER — LEVOTHYROXINE SODIUM 25 MCG PO TABS
25.0000 ug | ORAL_TABLET | Freq: Every day | ORAL | Status: DC
  Administered 2015-09-06: 25 ug via ORAL
  Filled 2015-09-06: qty 1

## 2015-09-06 MED ORDER — ACETAMINOPHEN 325 MG PO TABS: 650.0000 mg | ORAL_TABLET | Freq: Four times a day (QID) | ORAL | Status: DC | PRN

## 2015-09-06 MED ORDER — MEMANTINE HCL 10 MG PO TABS
10.0000 mg | ORAL_TABLET | Freq: Two times a day (BID) | ORAL | Status: DC
  Administered 2015-09-06: 10 mg via ORAL
  Filled 2015-09-06: qty 1

## 2015-09-06 MED ORDER — SODIUM CHLORIDE 0.9 % IJ SOLN
3.0000 mL | Freq: Two times a day (BID) | INTRAMUSCULAR | Status: DC
  Administered 2015-09-06 (×3): 3 mL via INTRAVENOUS

## 2015-09-06 MED ORDER — ONDANSETRON HCL 4 MG/2ML IJ SOLN: 4.0000 mg | Freq: Four times a day (QID) | INTRAMUSCULAR | Status: DC | PRN

## 2015-09-06 MED ORDER — HYDRALAZINE HCL 25 MG PO TABS
12.5000 mg | ORAL_TABLET | Freq: Two times a day (BID) | ORAL | Status: DC
  Administered 2015-09-06: 12.5 mg via ORAL
  Filled 2015-09-06: qty 1

## 2015-09-06 MED ORDER — FERROUS SULFATE 325 (65 FE) MG PO TABS
325.0000 mg | ORAL_TABLET | ORAL | Status: DC
  Administered 2015-09-06: 325 mg via ORAL
  Filled 2015-09-06: qty 1

## 2015-09-06 MED ORDER — ONDANSETRON HCL 4 MG PO TABS: 4.0000 mg | ORAL_TABLET | Freq: Four times a day (QID) | ORAL | Status: DC | PRN

## 2015-09-06 MED ORDER — MORPHINE SULFATE (PF) 2 MG/ML IV SOLN: 1.0000 mg | INTRAVENOUS | Status: DC | PRN

## 2015-09-06 MED ORDER — ACETAMINOPHEN 650 MG RE SUPP: 650.0000 mg | Freq: Four times a day (QID) | RECTAL | Status: DC | PRN

## 2015-09-06 NOTE — Consult Note (Signed)
Consultation Note Date: 09/06/2015   Patient Name: Ricky [name]  DOB: 04-Nov-1917  MRN: [id number]  Age / Sex: 81 y.o., male  PCP: Gayland Curry, DO Referring Physician: Modena Jansky, MD  Reason for Consultation: Establishing goals of care, Psychosocial/spiritual support and Terminal care    Clinical Assessment/Narrative:  Transitioning at EOL.  Many years, at end of disease trajectory  of supra-nuclear palsy  Consult is for review of medical treatment options, clarification of goals of care and end of life issues, disposition and options, and symptom recommendation.  This NP Wadie Lessen reviewed medical records, received report from team, assessed the patient and then spoke to patient's daughter/HPOA by telephone to discuss diagnosis prognosis, Sebastian, EOL wishes disposition and options.  A detailed discussion was had today regarding advanced directives.  Concepts specific to code status, artifical feeding and hydration, continued IV antibiotics and rehospitalization was had.  The difference between a aggressive medical intervention path  and a palliative comfort care path for this patient at this time was had.  Values and goals of care important to patient and family were attempted to be elicited.  Concept of Hospice and Palliative Care were discussed  Natural trajectory and expectations at EOL were discussed.  Questions and concerns addressed.  Family encouraged to call with questions or concerns.  PMT will continue to support holistically.    SUMMARY OF RECOMMENDATIONS  Shift to full comfort. Hope is for comfort and dignity. No futher life prolonging intervetnions.  Hopeful for [employer].  Currently a patient of [facility].  I spoke to Gregary Cromer regarding this patient.  Code Status/Advance Care Planning: DNR    Code Status Orders        Start     Ordered   09/06/15 0140  Do not attempt  resuscitation (DNR)   Continuous    Question Answer Comment  In the event of cardiac or respiratory ARREST Do not call a "code blue"   In the event of cardiac or respiratory ARREST Do not perform Intubation, CPR, defibrillation or ACLS   In the event of cardiac or respiratory ARREST Use medication by any route, position, wound care, and other measures to relive pain and suffering. May use oxygen, suction and manual treatment of airway obstruction as needed for comfort.      09/06/15 0140    Advance Directive Documentation        Most Recent Value   Type of Advance Directive  Out of facility DNR (pink MOST or yellow form)   Pre-existing out of facility DNR order (yellow form or pink MOST form)  Yellow form placed in chart (order not valid for inpatient use)   "MOST" Form in Place?          Symptom Management:    Pain/Dyspnea: Roxanol 5 mg po/sl every 2 hrs prn  Palliative Prophylaxis:  *  Pain assessment, mouth care, postioning  Additional Recommendations (Limitations, Scope, Preferences): * no further life prolonging measures Psycho-social/Spiritual:  Support System: son and daughter Desire for further Chaplaincy support:no  Prognosis: < 2 weeks  Discharge Planning: Hospice facility   Chief Complaint/ Primary Diagnoses: Present on Admission:  . Failure to thrive in adult . Acute encephalopathy . Alzheimer disease . Hypertension . Hypothyroidism  I have reviewed the medical record, interviewed the patient and family, and examined the patient. The following aspects are pertinent.  Past Medical History  Diagnosis Date  . IBS (irritable bowel syndrome)   . Diverticulosis   .  Cataract   . OAB (overactive bladder)   . Macular degeneration   . Hypertension   . Parkinsonian syndrome ([place])   . Syncope, vasovagal   . Dementia due to Parkinson's disease without behavioral disturbance ([place])   . Unspecified transient cerebral ischemia   . Unspecified urinary incontinence    . Hypothyroidism   . Anemia, unspecified   . Depression   . Alzheimer's disease   . Gout, unspecified   . Thoracic or lumbosacral neuritis or radiculitis, unspecified   . Progressive supranuclear palsy ([place])   . Paralysis agitans ([place])   . Pneumonitis due to inhalation of food or vomitus ([place])   . Cellulitis and abscess of oral soft tissues   . Unspecified hypothyroidism   . Pneumonia, organism unspecified   . Pseudomonas infection in conditions classified elsewhere and of unspecified site   . Urinary tract infection, site not specified   . Osteoarthrosis, unspecified whether generalized or localized, unspecified site   . Osteoporosis, unspecified   . Gout, unspecified   . Thoracic or lumbosacral neuritis or radiculitis, unspecified   . Stroke [facility])    Social History   Social History  . Marital Status: Single    Spouse Name: N/A  . Number of Children: N/A  . Years of Education: N/A   Social History Main Topics  . Smoking status: Former Research scientist (life sciences)  . Smokeless tobacco: Never Used     Comment: QUIT SMOKING BACK IN THE LATE 50'S  . Alcohol Use: No  . Drug Use: No  . Sexual Activity: No   Other Topics Concern  . None   Social History Narrative   Family History  Problem Relation Age of Onset  . Cancer Mother   . Stroke Father   . Liver disease Father   . Kidney disease Brother   . Liver disease Brother   . Emphysema Brother    Scheduled Meds: . sodium chloride  3 mL Intravenous Q12H   Continuous Infusions:  PRN Meds:.acetaminophen **OR** acetaminophen, morphine injection, ondansetron **OR** ondansetron (ZOFRAN) IV Medications Prior to Admission:  Prior to Admission medications   Medication Sig Start Date End Date Taking? Authorizing Provider  acetaminophen (TYLENOL) 500 MG tablet Take 1,000 mg by mouth 3 (three) times daily.     Historical Provider, MD  AMBULATORY NON FORMULARY MEDICATION Pressure Air Mattress Dx: G60.8, L89.150 10/30/14   Tiffany L Reed, DO   bacitracin ointment Apply 1 application topically 2 (two) times daily. Applies to head of penis to decrease infection from catheter.    Historical Provider, MD  CRANBERRY SOFT PO Take 2 capsules by mouth 3 (three) times daily.     Historical Provider, MD  ferrous sulfate 325 (65 FE) MG tablet Take 325 mg by mouth every other day.     Historical Provider, MD  guaiFENesin (MUCINEX) 600 MG 12 hr tablet Take 1,200 mg by mouth 2 (two) times daily as needed for cough.    Historical Provider, MD  hydrALAZINE (APRESOLINE) 25 MG tablet Take 1/2 tablet by mouth twice daily to control blood pressure Patient taking differently: Take 12.5 mg by mouth 2 (two) times daily. Take 1/2 tablet by mouth twice daily to control blood pressure 11/14/14   Blanchie Serve, MD  lactose free nutrition (BOOST) LIQD Take 90 mLs by mouth 3 (three) times daily between meals. Patient taking differently: Take 90 mLs by mouth 2 (two) times daily between meals.  07/01/14   Barton Dubois, MD  levothyroxine (SYNTHROID, LEVOTHROID) 25 MCG tablet  TAKE 1 TABLET BY MOUTH DAILY Patient taking differently: TAKE 25MCG BY MOUTH DAILY 05/14/15   Tiffany L Reed, DO  Melatonin 5 MG/15ML LIQD Take 5 mg by mouth at bedtime as needed. 20 minutes before bed time    Historical Provider, MD  memantine (NAMENDA) 10 MG tablet Take 10 mg by mouth 2 (two) times daily.    Historical Provider, MD  NON FORMULARY D-Mannose, Take 1 teaspoon by mouth with fluids 2 times a day    Historical Provider, MD  polyethylene glycol (MIRALAX / GLYCOLAX) packet Take 17 g by mouth daily as needed for mild constipation. Patient taking differently: Take 17 g by mouth every other day.  07/01/14   Barton Dubois, MD  Probiotic Product ([place]) CAPS Take 1 capsule by mouth 2 (two) times daily.    Historical Provider, MD  vancomycin (VANCOCIN) 50 mg/mL oral solution 125mg  po qid x 14 days, then bid x 7 days, then daily for 7 days, then every 3 days for 14 days. 09/02/15    Nita Sells, MD  VOLTAREN 1 % GEL Apply 2 g topically 4 (four) times daily as needed (pain).  07/10/14   Historical Provider, MD   Allergies  Allergen Reactions  . Other Other (See Comments)    Dairy products- cause swallowing difficulty    CBC:    Component Value Date/Time   WBC 8.4 08/26/2015 2210   WBC 6.5 04/27/2015 1144   HGB 11.6* 08/26/2015 2222   HCT 34.0* 08/29/2015 2222   HCT 36.6* 04/27/2015 1144   PLT 75* 09/12/2015 2210   MCV 86.6 08/22/2015 2210   NEUTROABS 6.9 09/06/2015 2210   NEUTROABS 5.1 04/27/2015 1144   LYMPHSABS 1.2 08/29/2015 2210   LYMPHSABS 0.9 04/27/2015 1144   MONOABS 0.3 08/19/2015 2210   EOSABS 0.0 08/28/2015 2210   EOSABS 0.1 04/13/2014 1538   BASOSABS 0.0 08/18/2015 2210   BASOSABS 0.0 04/27/2015 1144   Comprehensive Metabolic Panel:    Component Value Date/Time   NA 139 08/29/2015 2222   NA 138 04/13/2014 1538   K 4.6 08/20/2015 2222   CL 105 08/25/2015 2222   CO2 25 09/02/2015 0925   BUN 65* 09/04/2015 2222   BUN 14 04/13/2014 1538   CREATININE 1.10 09/08/2015 2222   GLUCOSE 61* 09/06/2015 2222   GLUCOSE 86 04/13/2014 1538   CALCIUM 8.6* 09/02/2015 0925   AST 41 08/29/2015 2335   ALT 27 08/29/2015 2335   ALKPHOS 54 08/29/2015 2335   BILITOT 0.6 08/29/2015 2335   PROT 5.4* 08/29/2015 2335   PROT 7.6 01/24/2014 1203   ALBUMIN 2.6* 08/29/2015 2335   ALBUMIN 4.0 01/24/2014 1203    Review of Systems  Unable to perform ROS Eyes: Negative.   Endocrine: Negative.   Genitourinary:       Incontinent    Physical Exam  Constitutional:  Chronically ill appearing, cachectic  HENT:  Head: Normocephalic.  Cardiovascular: Normal rate and regular rhythm.   Respiratory:  Decreased in bases  Musculoskeletal:  generalized weakness  Skin: Skin is warm and dry.    Vital Signs: BP 110/80 mmHg  Pulse 47  Temp(Src)   Resp 14  Wt 50.1 kg (110 lb 7.2 oz)  SpO2 98% SpO2: Last BM Date:  (PTA)  O2 Device:SpO2: 98 % O2 Flow  Rate: .  Intake/output summary:  Intake/Output Summary (Last 24 hours) at 09/06/15 1057 Last data filed at 09/06/15 0910  Gross per 24 hour  Intake  0 ml  Output    300 ml  Net   -300 ml   LBM:  BMP Latest Ref Rng 08/22/2015 09/02/2015 09/01/2015  Glucose 65 - 99 mg/dL 61(L) 65 53(L)  BUN 6 - 20 mg/dL 65(H) 37(H) 40(H)  Creatinine 0.61 - 1.24 mg/dL 1.10 0.56(L) 0.72  BUN/Creat Ratio 10 - 22 - - -  Sodium 135 - 145 mmol/L 139 135 132(L)  Potassium 3.5 - 5.1 mmol/L 4.6 3.7 4.1  Chloride 101 - 111 mmol/L 105 104 105  CO2 22 - 32 mmol/L - 25 24  Calcium 8.9 - 10.3 mg/dL - 8.6(L) 8.0(L)    Baseline Weight: Weight: 50.1 kg (110 lb 7.2 oz) Most recent weight: Weight: 50.1 kg (110 lb 7.2 oz)      Palliative Assessment/Data:  Flowsheet Rows        Most Recent Value   Intake Tab    Referral Department  Hospitalist   Unit at Time of Referral  Med/Surg Unit   Palliative Care Primary Diagnosis  Neurology   Date Notified  09/06/15   Palliative Care Type  Return patient Palliative Care   Reason for referral  Clarify Goals of Care   Date of Admission  09/02/2015   # of days IP prior to Palliative referral  1   Clinical Assessment    Psychosocial & Spiritual Assessment    Palliative Care Outcomes       Additional Data Reviewed: Recent Labs     09/08/2015  2210  08/31/2015  2222  WBC  8.4   --   HGB  10.4*  11.6*  PLT  75*   --   NA   --   139  BUN   --   65*  CREATININE   --   1.10    Time In: 0930 Time Out: 1045 Time Total: 75 min Greater than 50%  of this time was spent counseling and coordinating care related to the above assessment and plan.  Signed by: Wadie Lessen, NP  Knox Royalty, NP  09/06/2015, 10:57 AM  Please contact Palliative Medicine Team phone at [phone] for questions and concerns.

## 2015-09-06 NOTE — Progress Notes (Addendum)
Inpatient [facility] RM [phone]- [facility] RN Visit. GIP related admission to [facility] DX: Supranuclear Palsy. Pt. Is a DNR code status. Patient seen lying on his back with the hob elevated. O2 at 3L [place]. Pt. Is unresponsive  to name and to touch. Resps regular and unlabored. Bilateral hands and arms are contracted. Staff RN reports pt. Is not taking in anything in PO and will not open his mouth. Lower extremities are cool to the touch. Staff reports son was in earlier and daughter who is POA calls several times a day. [facility] LCSW Ollen Gross called as PMT is asking for possible [employer] placement. She will f/u with family and with [facility] for eligibility. Med list and transfer summary placed on shadow chart. Please call with any hospice questions.   [facility] Liaison [phone]     130 pm Addendum:   Hospital Liaison advised by [facility] LCSW that pt. can transfer to [facility].  Barbra Sarks RN

## 2015-09-06 NOTE — Progress Notes (Signed)
NCM sent message to [place] , Per RN note patient is for [facility] to [facility].

## 2015-09-06 NOTE — Progress Notes (Signed)
Nutrition Brief Note  Chart reviewed. Pt now transitioning to comfort care.  No further nutrition interventions warranted at this time.  Please re-consult as needed.   Cyrena Kuchenbecker A. Sharrell Krawiec, RD, LDN, CDE Pager: [phone] After hours Pager: [phone]  

## 2015-09-06 NOTE — H&P (Signed)
[facility] History and Physical  Ricky [name] CNO:[id number] DOB: [birth year] DOA: 08/29/2015  Referring physician: Dr. Maryan Rued. PCP: Hollace Kinnier, DO  Specialists: Palliative care.  Chief Complaint: Unresponsive.  HPI: Ricky [name] is a 81 y.o. male with history of progressive supranuclear palsy, dementia, hypertension, hypothyroidism who was recently admitted to the hospital for sepsis probably from C. difficile was brought to the ER because of patient being more unresponsive and not able to eat as usual. Patient's family noticed since discharge patient has been progressively declining and failure to thrive. Patient is also found to be hypoglycemic in the ER. On my exam patient is still very unresponsive and does not communicate. I have discussed patient's daughter Ms. Ricky [name] also patient's healthcare power of attorney and at this time plan is to admit patient for comfort measures with no aggressive measures and get hospice for further management.   Review of Systems: As presented in the history of presenting illness, rest negative.  Past Medical History  Diagnosis Date  . IBS (irritable bowel syndrome)   . Diverticulosis   . Cataract   . OAB (overactive bladder)   . Macular degeneration   . Hypertension   . Parkinsonian syndrome ([place])   . Syncope, vasovagal   . Dementia due to Parkinson's disease without behavioral disturbance ([place])   . Unspecified transient cerebral ischemia   . Unspecified urinary incontinence   . Hypothyroidism   . Anemia, unspecified   . Depression   . Alzheimer's disease   . Gout, unspecified   . Thoracic or lumbosacral neuritis or radiculitis, unspecified   . Progressive supranuclear palsy ([place])   . Paralysis agitans ([place])   . Pneumonitis due to inhalation of food or vomitus ([place])   . Cellulitis and abscess of oral soft tissues   . Unspecified hypothyroidism   . Pneumonia, organism unspecified   . Pseudomonas infection in conditions  classified elsewhere and of unspecified site   . Urinary tract infection, site not specified   . Osteoarthrosis, unspecified whether generalized or localized, unspecified site   . Osteoporosis, unspecified   . Gout, unspecified   . Thoracic or lumbosacral neuritis or radiculitis, unspecified   . Stroke [facility])    Past Surgical History  Procedure Laterality Date  . Esophagogastroduodenoscopy    . Eye surgery  2006    LEFT CATARACT  . Esophagogastroduodenoscopy N/A 04/24/2015    Procedure: ESOPHAGOGASTRODUODENOSCOPY (EGD);  Surgeon: Carol Ada, MD;  Location: [facility] ENDOSCOPY;  Service: Endoscopy;  Laterality: N/A;   Social History:  reports that he has quit smoking. He has never used smokeless tobacco. He reports that he does not drink alcohol or use illicit drugs. Where does patient live home. Can patient participate in ADLs? No.  Allergies  Allergen Reactions  . Other Other (See Comments)    Dairy products- cause swallowing difficulty     Family History:  Family History  Problem Relation Age of Onset  . Cancer Mother   . Stroke Father   . Liver disease Father   . Kidney disease Brother   . Liver disease Brother   . Emphysema Brother       Prior to Admission medications   Medication Sig Start Date End Date Taking? Authorizing Provider  acetaminophen (TYLENOL) 500 MG tablet Take 1,000 mg by mouth 3 (three) times daily.     Historical Provider, MD  AMBULATORY NON FORMULARY MEDICATION Pressure Air Mattress Dx: G60.8, L89.150 10/30/14   Tiffany L Reed, DO  bacitracin ointment Apply  1 application topically 2 (two) times daily. Applies to head of penis to decrease infection from catheter.    Historical Provider, MD  CRANBERRY SOFT PO Take 2 capsules by mouth 3 (three) times daily.     Historical Provider, MD  ferrous sulfate 325 (65 FE) MG tablet Take 325 mg by mouth every other day.     Historical Provider, MD  guaiFENesin (MUCINEX) 600 MG 12 hr tablet Take 1,200 mg by mouth 2  (two) times daily as needed for cough.    Historical Provider, MD  hydrALAZINE (APRESOLINE) 25 MG tablet Take 1/2 tablet by mouth twice daily to control blood pressure Patient taking differently: Take 12.5 mg by mouth 2 (two) times daily. Take 1/2 tablet by mouth twice daily to control blood pressure 11/14/14   Blanchie Serve, MD  lactose free nutrition (BOOST) LIQD Take 90 mLs by mouth 3 (three) times daily between meals. Patient taking differently: Take 90 mLs by mouth 2 (two) times daily between meals.  07/01/14   Barton Dubois, MD  levothyroxine (SYNTHROID, LEVOTHROID) 25 MCG tablet TAKE 1 TABLET BY MOUTH DAILY Patient taking differently: TAKE 25MCG BY MOUTH DAILY 05/14/15   Tiffany L Reed, DO  Melatonin 5 MG/15ML LIQD Take 5 mg by mouth at bedtime as needed. 20 minutes before bed time    Historical Provider, MD  memantine (NAMENDA) 10 MG tablet Take 10 mg by mouth 2 (two) times daily.    Historical Provider, MD  NON FORMULARY D-Mannose, Take 1 teaspoon by mouth with fluids 2 times a day    Historical Provider, MD  polyethylene glycol (MIRALAX / GLYCOLAX) packet Take 17 g by mouth daily as needed for mild constipation. Patient taking differently: Take 17 g by mouth every other day.  07/01/14   Barton Dubois, MD  Probiotic Product ([place]) CAPS Take 1 capsule by mouth 2 (two) times daily.    Historical Provider, MD  vancomycin (VANCOCIN) 50 mg/mL oral solution 125mg  po qid x 14 days, then bid x 7 days, then daily for 7 days, then every 3 days for 14 days. 09/02/15   Nita Sells, MD  VOLTAREN 1 % GEL Apply 2 g topically 4 (four) times daily as needed (pain).  07/10/14   Historical Provider, MD    Physical Exam: Filed Vitals:   08/21/2015 2325 08/26/2015 2330 09/06/15 0000 09/06/15 0104  BP: 123/87 120/83 119/83 115/75  Pulse: 53 52 57 54  Resp: 11 16 18 14   SpO2: 100% 100% 97% 93%     General:  Poorly built and nourished.  Eyes: Anicteric no pallor.  ENT: No discharge  from the ears eyes nose or mouth.  Neck: No mass felt.  Cardiovascular: S1-S2 heard.  Respiratory: No rhonchi or crepitations.  Abdomen: Soft nontender bowel sounds present.  Skin: Chronic skin changes.  Musculoskeletal: No edema.  Psychiatric: Patient is unresponsive.  Neurologic: Patient is unresponsive but pupils are reacting. Patient does not follow commands.  Labs on Admission:  Basic Metabolic Panel:  Recent Labs Lab 08/30/15 0405 08/31/15 0941 09/01/15 1000 09/02/15 0925 09/14/2015 2222  NA 123* 129* 132* 135 139  K 5.5* 3.9 4.1 3.7 4.6  CL 88* 97* 105 104 105  CO2 22 25 24 25   --   GLUCOSE 80 59* 53* 65 61*  BUN 40* 37* 40* 37* 65*  CREATININE 1.05 0.86 0.72 0.56* 1.10  CALCIUM 8.9 8.5* 8.0* 8.6*  --    Liver Function Tests: No results for input(s): AST,  ALT, ALKPHOS, BILITOT, PROT, ALBUMIN in the last 168 hours. No results for input(s): LIPASE, AMYLASE in the last 168 hours. No results for input(s): AMMONIA in the last 168 hours. CBC:  Recent Labs Lab 08/30/15 0405 08/31/15 0941 09/01/15 1000 08/28/2015 2210 09/17/2015 2222  WBC 4.5 4.3 5.3 8.4  --   NEUTROABS  --  3.4 4.0 6.9  --   HGB 11.6* 10.0* 10.4* 10.4* 11.6*  HCT 32.5* 28.5* 30.3* 30.3* 34.0*  MCV 84.6 83.8 85.1 86.6  --   PLT 121* 111* 113* 75*  --    Cardiac Enzymes: No results for input(s): CKTOTAL, CKMB, CKMBINDEX, TROPONINI in the last 168 hours.  BNP (last 3 results) No results for input(s): BNP in the last 8760 hours.  ProBNP (last 3 results) No results for input(s): PROBNP in the last 8760 hours.  CBG:  Recent Labs Lab 09/01/2015 2012 09/15/2015 2323 09/17/2015 2350  GLUCAP 87 46* 164*    Radiological Exams on Admission: Dg Chest Port 1 View  09/04/2015  CLINICAL DATA:  81 year old male with hypotension and failure to thrive. EXAM: PORTABLE CHEST 1 VIEW COMPARISON:  08/29/2015 FINDINGS: Cardiomegaly is grossly stable. Development of retrocardiac opacity, with obscuration of  left hemidiaphragm. Left apex obscured by patient's chin, patient contracted. Minimal atelectasis at the right lung base, unchanged. No pulmonary edema. The bones are under mineralized. IMPRESSION: Development retrocardiac opacity with obscuration of left hemidiaphragm. Atelectasis, pneumonia, or aspiration, with possible small left pleural effusion. Electronically Signed   By: Jeb Levering M.D.   On: 09/01/2015 21:01     Assessment/Plan Principal Problem:   Failure to thrive in adult Active Problems:   Hypothyroidism   Alzheimer disease   Hypertension   Acute encephalopathy   Hypoglycemia   1. Failure to thrive. 2. Acute encephalopathy. 3. Hypertension. 4. Hypothyroidism. 5. Progressive supranuclear palsy. 6. Hypoglycemic episodes. 7. Dementia.  Plan - at this time I have discussed patient's healthcare power of attorney patient's daughter Ms. Ricky [name]. At this time plan is to keep patient comfortable with no aggressive measures and hospice has been consulted. Patient will be on when necessary IV morphine for comfort measures.  I have reviewed patient's old charts and labs.   DVT Prophylaxis Lovenox.  Code Status: DO NOT RESUSCITATE.  Family Communication: Patient's daughter Ms. Ricky [name].  Disposition Plan: Admit to inpatient.    Andie Mungin N. [facility] Pager [phone].  If 7PM-7AM, please contact night-coverage [email or web address] Password [email or web address] 09/06/2015, 1:41 AM

## 2015-09-06 NOTE — ED Notes (Signed)
Rectal temp low 90. MD notified, no new order gotten, pt to be transfer still to [facility]  Palliative bed, pt is DNR, report given to the floor and Alleen Borne, RN notified.

## 2015-09-06 NOTE — Care Management Note (Signed)
Case Management Note  Patient Details  Name: Ricky [name] MRN: [id number] Date of Birth: Mar 12, 1917  Subjective/Objective:    Patient is active with Hospice and [place] , [place] spoke with [name] the rep for [facility],  She states family is interested in residential hospice but family will have meeting with palliative, CSW referral.  NCM will cont to follow for dc needs.                Action/Plan:   Expected Discharge Date:                  Expected Discharge Plan:  [place]  In-House Referral:  Clinical Social Work  Discharge planning Services  CM Consult  Post Acute Care Choice:    Choice offered to:     DME Arranged:    DME Agency:     HH Arranged:    [place] Agency:     Status of Service:  In process, will continue to follow  Medicare Important Message Given:    Date Medicare IM Given:    Medicare IM give by:    Date Additional Medicare IM Given:    Additional Medicare Important Message give by:     If discussed at [place] of Stay Meetings, dates discussed:    Additional Comments:  Zenon Mayo, RN 09/06/2015, 12:14 PM

## 2015-09-06 NOTE — Progress Notes (Signed)
PROGRESS NOTE    Ricky [name] DXA:[id number] DOB: [birth year] DOA: 08/24/2015 PCP: Hollace Kinnier, DO  HPI/Brief narrative 81 year old male with history of progressive supranuclear palsy, dementia, HTN, hypothyroid, Parkinsonian syndrome, recent hospitalization 08/29/15-09/02/15 for sepsis secondary to possible C. difficile, multiple hospitalizations (6 in the last 6 months), apparently discharged home with hospice, return to [facility] on 08/20/2015 due to being more unresponsive and not able to eat as usual. As per family, patient has been progressively declining since last discharge. In the ED found to be hypoglycemic. Nonverbal. Palliative care consulted for goals of care and to determine eligibility for residential hospice.   Assessment/Plan:  Adult failure to thrive - Multifactorial secondary to advanced age, advanced dementia and multiple other significant comorbidities - Frequent hospitalizations in the last 6 months - Even though he was discharged on home hospice recently, returns to Hospital and family opted for comfort oriented care - Palliative care consulted for goals of care and to determine eligibility for residential hospice  Hypoglycemia  - Secondary to poor oral intake. Comfort oriented care   Anemia and thrombocytopenia - Anemia is stable. Thrombocytopenia appears chronic and platelet counts have dropped from the 100s to 75. No reported bleeding.  Left retrocardiac opacity on chest x-ray - DD: Atelectasis, aspiration or pneumonia. No aggressive intervention.  No acute intervention for following: Chronic encephalopathy  Hypertension Hypothyroid Progressive supranuclear palsy Dementia  DVT prophylaxis: Lovenox  Code Status: DO NOT RESUSCITATE  Family Communication: None at bedside  Disposition Plan: To be determined i.e. residential hospice versus home with hospice    Consultants:  Palliative care team   Procedures:  None   Antibiotics:  None    Subjective: Nonverbal. Eyes open but does not track.   Objective: Filed Vitals:   09/08/2015 2330 09/06/15 0000 09/06/15 0104 09/06/15 0554  BP: 120/83 119/83 115/75 110/80  Pulse: 52 57 54 47  Resp: 16 18 14 14   Weight:    50.1 kg (110 lb 7.2 oz)  SpO2: 100% 97% 93% 98%    Intake/Output Summary (Last 24 hours) at 09/06/15 1031 Last data filed at 09/06/15 0910  Gross per 24 hour  Intake      0 ml  Output    300 ml  Net   -300 ml   Filed Weights   09/06/15 0554  Weight: 50.1 kg (110 lb 7.2 oz)     Exam:  General exam: Elderly frail and chronically ill-looking male lying comfortably propped up in bed.  Respiratory system: reduced breath sounds bilaterally/poor effort. No increased work of breathing. Cardiovascular system: S1 & S2 heard, RRR. No JVD, murmurs, gallops, clicks or pedal edema. Gastrointestinal system: Abdomen is nondistended, soft and nontender. Normal bowel sounds heard. Central nervous system: somnolent, intermittently opens eyes but does not track and nonverbal. No focal neurological deficits. Extremities: possible contractures of all extremities, upper >lower    Data Reviewed: Basic Metabolic Panel:  Recent Labs Lab 08/31/15 0941 09/01/15 1000 09/02/15 0925 08/29/2015 2222  NA 129* 132* 135 139  K 3.9 4.1 3.7 4.6  CL 97* 105 104 105  CO2 25 24 25   --   GLUCOSE 59* 53* 65 61*  BUN 37* 40* 37* 65*  CREATININE 0.86 0.72 0.56* 1.10  CALCIUM 8.5* 8.0* 8.6*  --    Liver Function Tests: No results for input(s): AST, ALT, ALKPHOS, BILITOT, PROT, ALBUMIN in the last 168 hours. No results for input(s): LIPASE, AMYLASE in the last 168 hours. No results for input(s): AMMONIA  in the last 168 hours. CBC:  Recent Labs Lab 08/31/15 0941 09/01/15 1000 09/11/2015 2210 09/10/2015 2222  WBC 4.3 5.3 8.4  --   NEUTROABS 3.4 4.0 6.9  --   HGB 10.0* 10.4* 10.4* 11.6*  HCT 28.5* 30.3* 30.3* 34.0*  MCV 83.8 85.1 86.6  --   PLT 111* 113* 75*  --    Cardiac  Enzymes: No results for input(s): CKTOTAL, CKMB, CKMBINDEX, TROPONINI in the last 168 hours. BNP (last 3 results) No results for input(s): PROBNP in the last 8760 hours. CBG:  Recent Labs Lab 08/31/2015 2012 08/30/2015 2323 09/16/2015 2350  GLUCAP 87 46* 164*    No results found for this or any previous visit (from the past 240 hour(s)).         Studies: Dg Chest Port 1 View  08/26/2015  CLINICAL DATA:  81 year old male with hypotension and failure to thrive. EXAM: PORTABLE CHEST 1 VIEW COMPARISON:  08/29/2015 FINDINGS: Cardiomegaly is grossly stable. Development of retrocardiac opacity, with obscuration of left hemidiaphragm. Left apex obscured by patient's chin, patient contracted. Minimal atelectasis at the right lung base, unchanged. No pulmonary edema. The bones are under mineralized. IMPRESSION: Development retrocardiac opacity with obscuration of left hemidiaphragm. Atelectasis, pneumonia, or aspiration, with possible small left pleural effusion. Electronically Signed   By: Jeb Levering M.D.   On: 08/24/2015 21:01        Scheduled Meds: . enoxaparin (LOVENOX) injection  40 mg Subcutaneous Daily  . ferrous sulfate  325 mg Oral QODAY  . hydrALAZINE  12.5 mg Oral BID  . levothyroxine  25 mcg Oral QAC breakfast  . memantine  10 mg Oral BID  . sodium chloride  3 mL Intravenous Q12H   Continuous Infusions:   Principal Problem:   Failure to thrive in adult Active Problems:   Hypothyroidism   Alzheimer disease   Hypertension   Acute encephalopathy   Hypoglycemia    Time spent: 25 minutes.    Vernell Leep, MD, FACP, FHM. [facility] Pager [phone]  If 7PM-7AM, please contact night-coverage [email or web address] Password [email or web address] 09/06/2015, 10:31 AM    LOS: 1 day

## 2015-09-14 DIAGNOSIS — Z66 Do not resuscitate: Secondary | ICD-10-CM | POA: Insufficient documentation

## 2015-09-18 NOTE — Progress Notes (Signed)
Assessed patient ,noted not breathing and no pulse ,no vital signs appreciated.Pronounced dead @ [phone]; confirmed with [place] [employer].

## 2015-09-18 NOTE — Care Management Note (Signed)
Case Management Note  Patient Details  Name: Ricky [name] MRN: [id number] Date of Birth: 24-Jul-1917  Subjective/Objective:     Patient expired.                Action/Plan:   Expected Discharge Date:                  Expected Discharge Plan:  [place]  In-House Referral:  Clinical Social Work  Discharge planning Services  CM Consult  Post Acute Care Choice:    Choice offered to:     DME Arranged:    DME Agency:     HH Arranged:    [place] Agency:     Status of Service:  Completed, signed off  Medicare Important Message Given:    Date Medicare IM Given:    Medicare IM give by:    Date Additional Medicare IM Given:    Additional Medicare Important Message give by:     If discussed at [place] of Stay Meetings, dates discussed:    Additional Comments:  Zenon Mayo, RN 2015-09-21, 11:55 AM

## 2015-09-18 NOTE — Progress Notes (Signed)
Postmortem Note  Pronounced By: Stephanie Coup, RN and Job Founds, RN  Time of Death: [phone] Donor Services: Spoke with Henri Medal. Pt is not suitable for donation. [phone]  Family: [name] (daughter) notified  Attending: Tylene Fantasia, NP notified  Candise Bowens RN

## 2015-09-18 DEATH — deceased

## 2015-10-18 NOTE — Discharge Summary (Signed)
Death Summary  Ricky [name] DTO:[id number] DOB: [birth year] DOA: 09-25-15  PCP: Hollace Kinnier, DO PCP/Office notified: Forwarding death summary.  Admit date: 09-25-2015 Date of Death: 10-15-15  Final Diagnoses:  Principal Problem:   Failure to thrive in adult Active Problems:   Hypothyroidism   Alzheimer disease   Hypertension   Acute encephalopathy   Hypoglycemia   DNR (do not resuscitate)    History of present illness:  81 year old male with history of progressive supranuclear palsy, dementia, HTN, hypothyroid, Parkinsonian syndrome, recent hospitalization 08/29/15-09/02/15 for sepsis secondary to possible C. difficile, multiple hospitalizations (6 in the last 6 months), apparently discharged home with hospice, return to [facility] on 2015/09/25 due to being more unresponsive and not able to eat as usual. As per family, patient has been progressively declining since last discharge. In the ED found to be hypoglycemic.  Hospital Course:  Adult failure to thrive - Multifactorial secondary to advanced age, advanced dementia and multiple other significant comorbidities - Frequent hospitalizations in the last 6 months - Even though he was discharged on home hospice recently, returns to Hospital and family opted for comfort oriented care - Palliative care consulted for goals of care and to determine eligibility for residential hospice - Palliative care evaluated patient and agreed that he was appropriate for residential hospice with shift to full comfort care. Plans were to transfer him to be in place the next day. However patient demised in the hospital on 09/27/2015 at 4:46 AM.  Hypoglycemia  - Secondary to poor oral intake. Comfort oriented care   Anemia and thrombocytopenia - Anemia is stable. Thrombocytopenia appears chronic and platelet counts have dropped from the 100s to 75. No reported bleeding.  Left retrocardiac opacity on chest x-ray - DD: Atelectasis, aspiration or  pneumonia. No aggressive intervention.  No acute intervention for following: Chronic encephalopathy  Hypertension Hypothyroid Progressive supranuclear palsy Dementia   Time: 10 minutes.  SignedVernell Leep  [facility] 2015-10-15, 4:30 PM

## 2015-10-23 ENCOUNTER — Telehealth: Payer: Self-pay | Admitting: Neurology

## 2015-10-23 NOTE — Telephone Encounter (Signed)
Patient's son is calling to thank you for your care of his father who recently passed away.

## 2015-10-23 NOTE — Telephone Encounter (Signed)
I will get a sympathy card for the family.

## 2015-10-25 ENCOUNTER — Telehealth: Payer: Self-pay | Admitting: Family Medicine

## 2015-10-25 NOTE — Telephone Encounter (Signed)
Pt son called and wanted to let you know that Ricky [name] passed away 09-19-15,

## 2015-10-29 ENCOUNTER — Telehealth: Payer: Self-pay | Admitting: Internal Medicine

## 2015-10-29 NOTE — Telephone Encounter (Signed)
Pt's son calling to let Dr. Harrington Challenger know he passed away 10-21-he thought a lot of her and appreciated everything she did for him-if she needs to call son [name] number is [phone]

## 2016-06-23 IMAGING — CT CT HEAD W/O CM
1 of 2 series · 15 of 30 positions shown, 19 images · non-contrast
Comparison: Head CT 04/12/2014

CLINICAL DATA: Mental status change.  Not as responsive.

EXAM:
CT HEAD WITHOUT CONTRAST
TECHNIQUE: Contiguous axial images were obtained from the base of the skull
through the vertex without intravenous contrast.

[Series 2: head · axial · 0.43mm/px · z∈[-827,-647]mm · 15 of 53 slices shown, 19 images]
[im 4/53  brain]
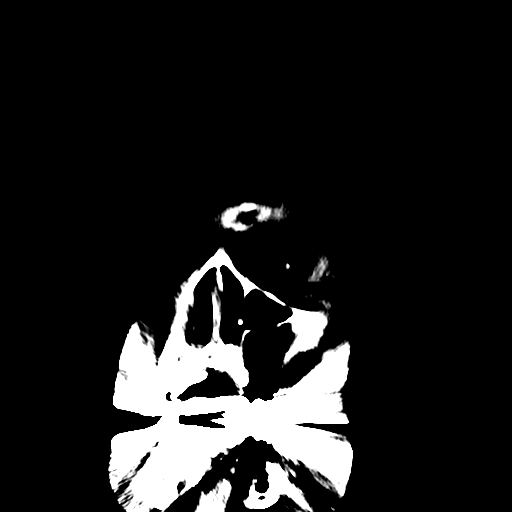
[im 4/53  bone]
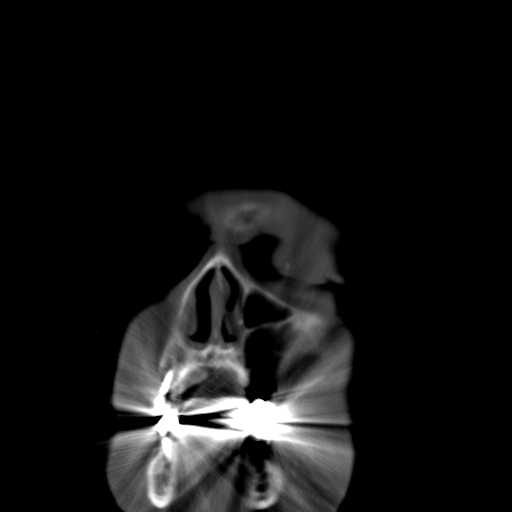
[im 7/53  brain]
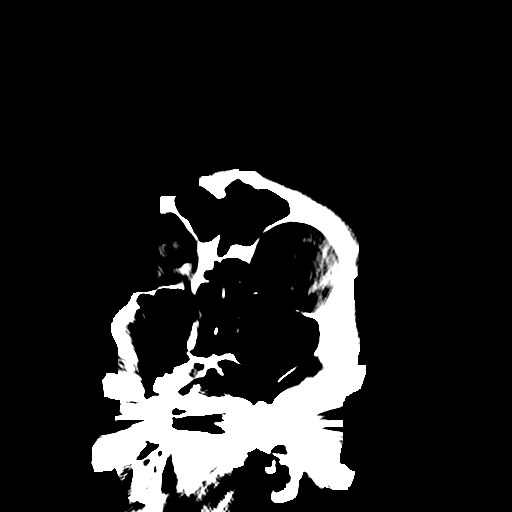
[im 10/53  brain]
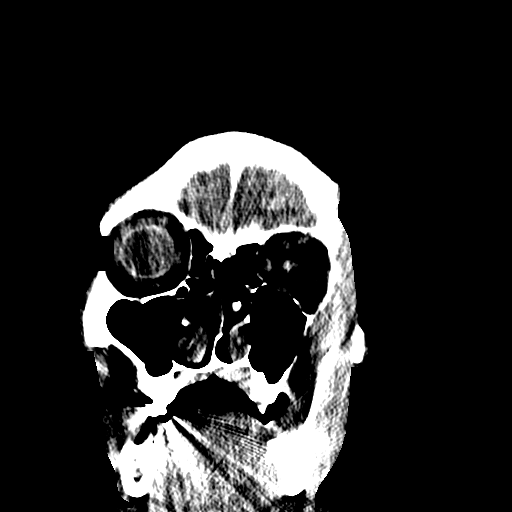
[im 14/53  brain]
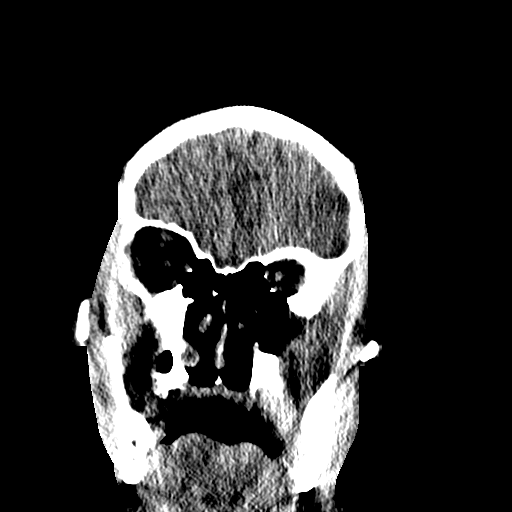
[im 17/53  brain]
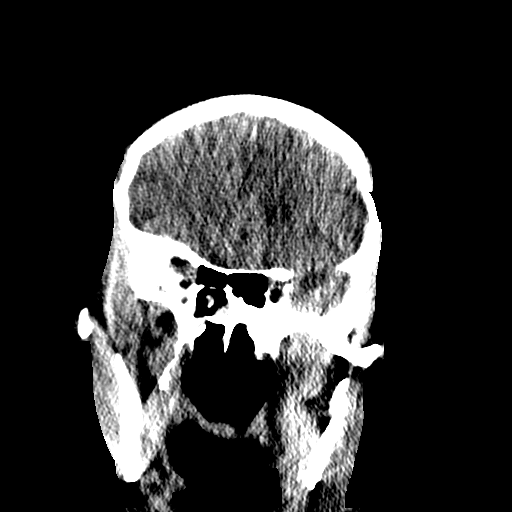
[im 17/53  bone]
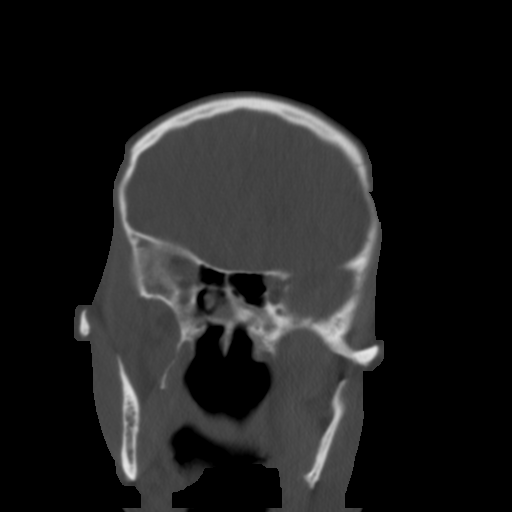
[im 20/53  brain]
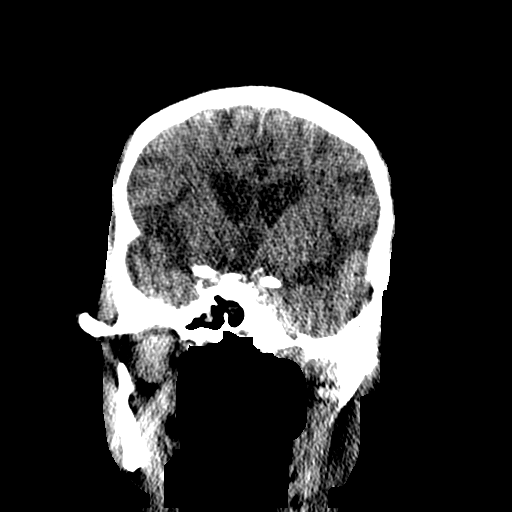
[im 23/53  brain]
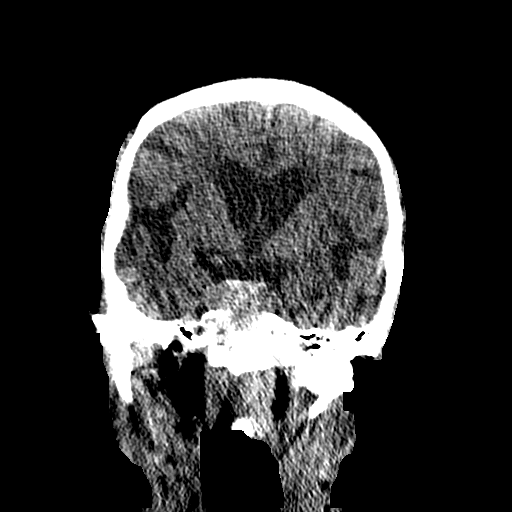
[im 27/53  brain]
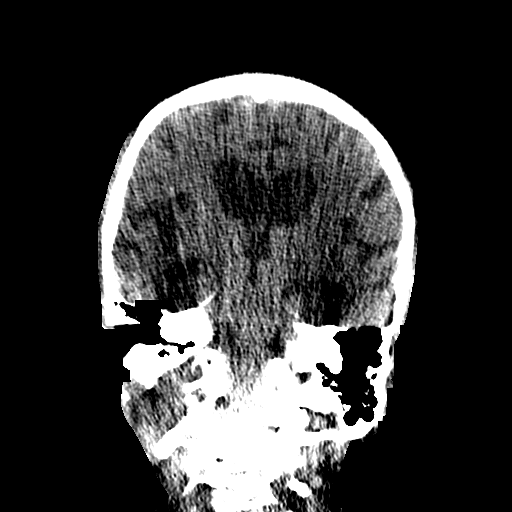
[im 30/53  brain]
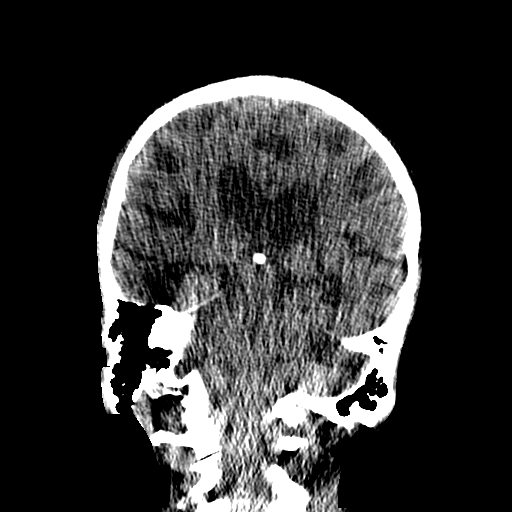
[im 30/53  bone]
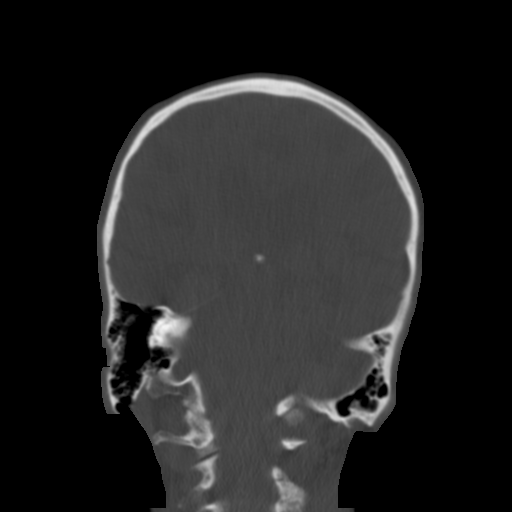
[im 33/53  brain]
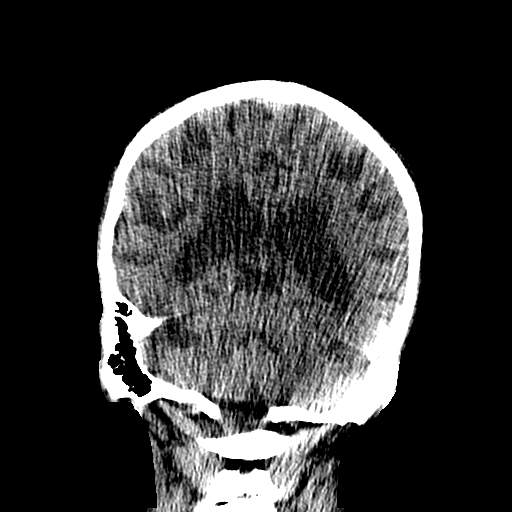
[im 36/53  brain]
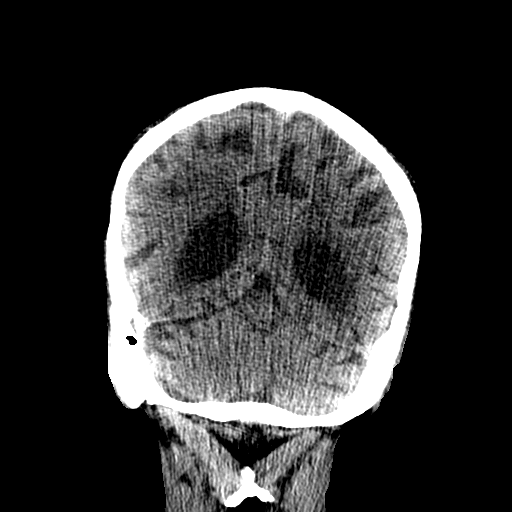
[im 40/53  brain]
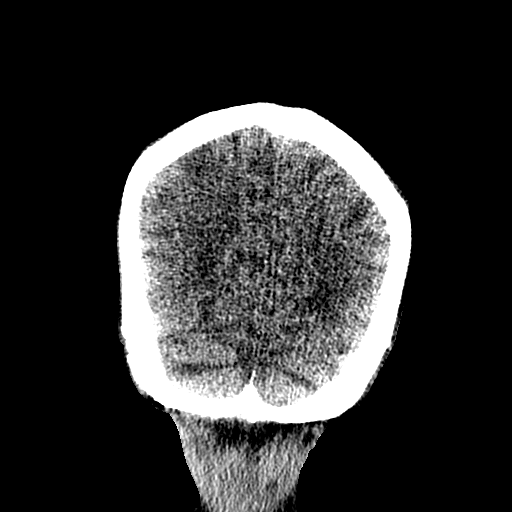
[im 43/53  brain]
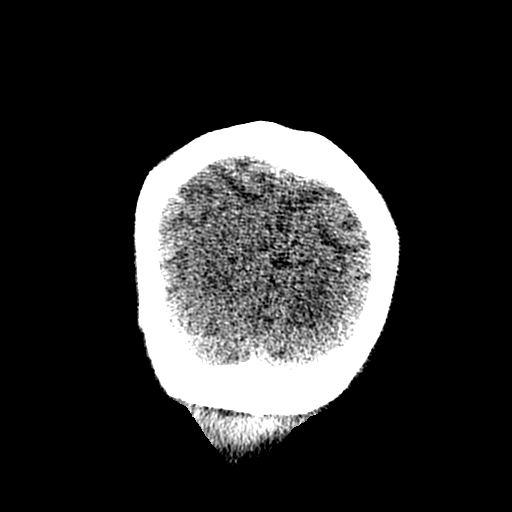
[im 43/53  bone]
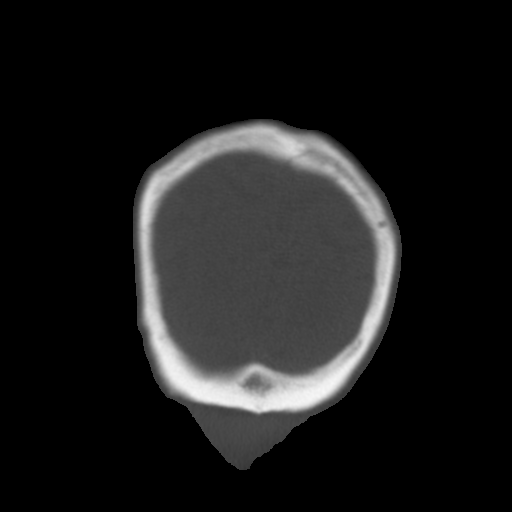
[im 46/53  brain]
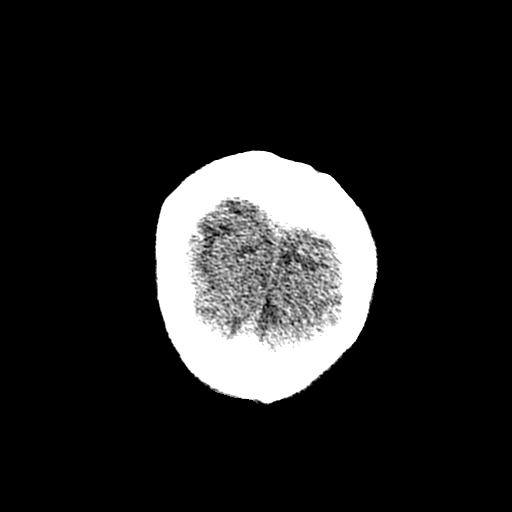
[im 49/53  brain]
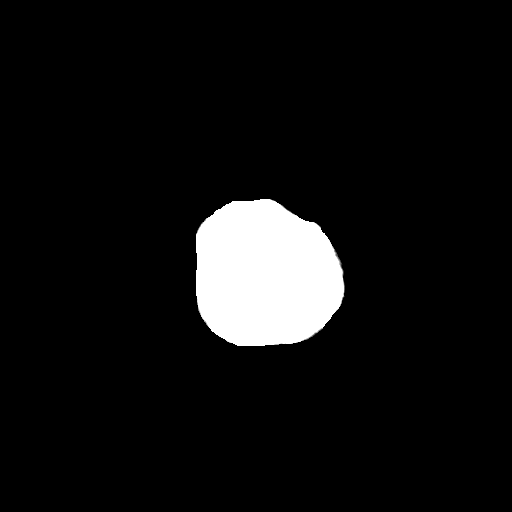

[15 of 30 positions shown; findings below may reference images not displayed]

FINDINGS: Patient is extremely kyphotic therefore non standard orientation was
scanned. The generates bone artifact.

No acute intracranial hemorrhage. No focal mass lesion. No CT
evidence of acute infarction. No midline shift or mass effect. No
hydrocephalus. Basilar cisterns are patent.

Generalized cortical atrophy and ventricular dilatation.

Paranasal sinuses and mastoid air cells are clear. Chronic
opacification of the sphenoid sinus.
IMPRESSION: 1. No acute intracranial findings. Nonstandard positioning does
limit evaluation.
2. Chronic atrophy and microvascular disease.
3. Chronic opacification of the sphenoid sinuses air cells

## 2016-07-11 IMAGING — CR DG CHEST 1V
1 series · 1 of 1 positions shown · non-contrast
Comparison: March 31, 2015.

CLINICAL DATA: Shortness of breath, edema.

EXAM:
CHEST  1 VIEW

[view not recorded]
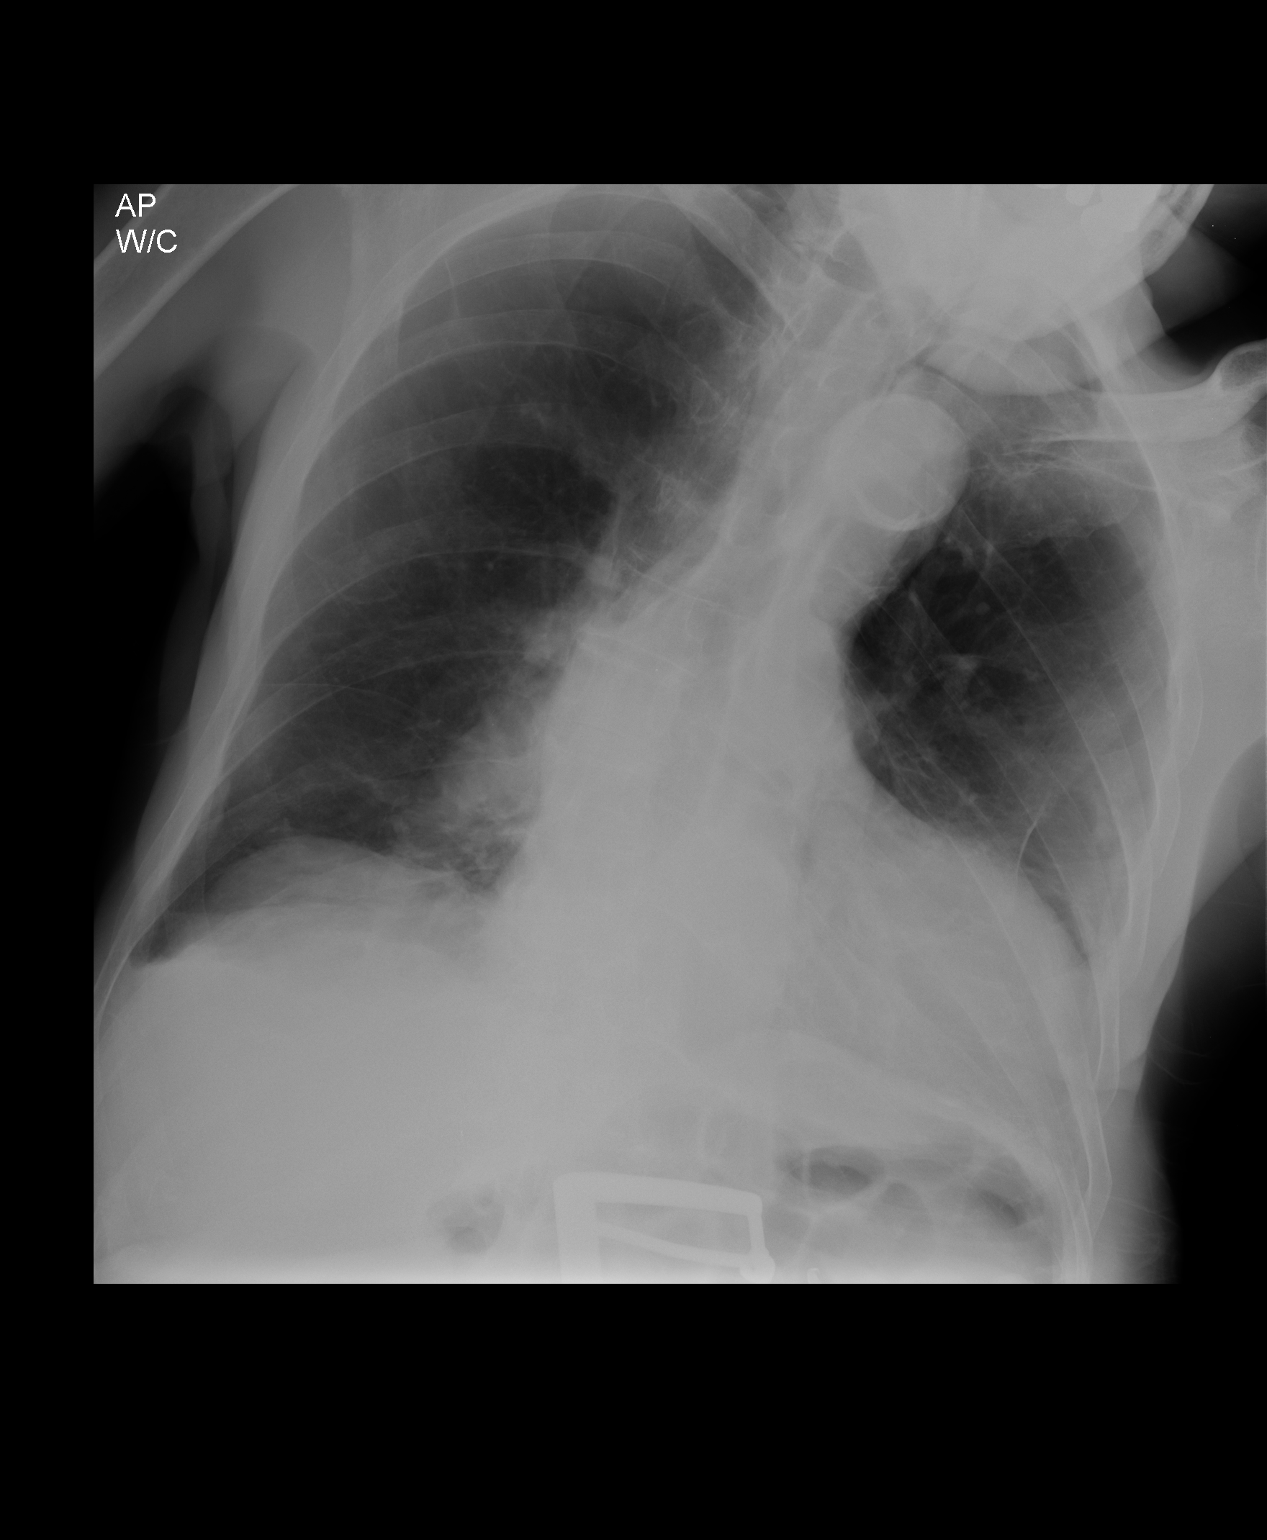

[1 of 1 positions shown; findings below may reference images not displayed]

FINDINGS: Stable cardiomediastinal silhouette. No pneumothorax or significant
pleural effusion is noted. Moderate dextroscoliosis of lower
thoracic spine is noted which is unchanged compared to prior exam.
No acute pulmonary disease is noted. Bony thorax is intact.
IMPRESSION: No acute cardiopulmonary abnormality seen.

## 2016-07-15 IMAGING — DX DG CHEST 2V
2 series · 2 of 2 positions shown · non-contrast
Comparison: 04/18/2015

CLINICAL DATA: Fever.

EXAM:
CHEST  2 VIEW

[chest lat]
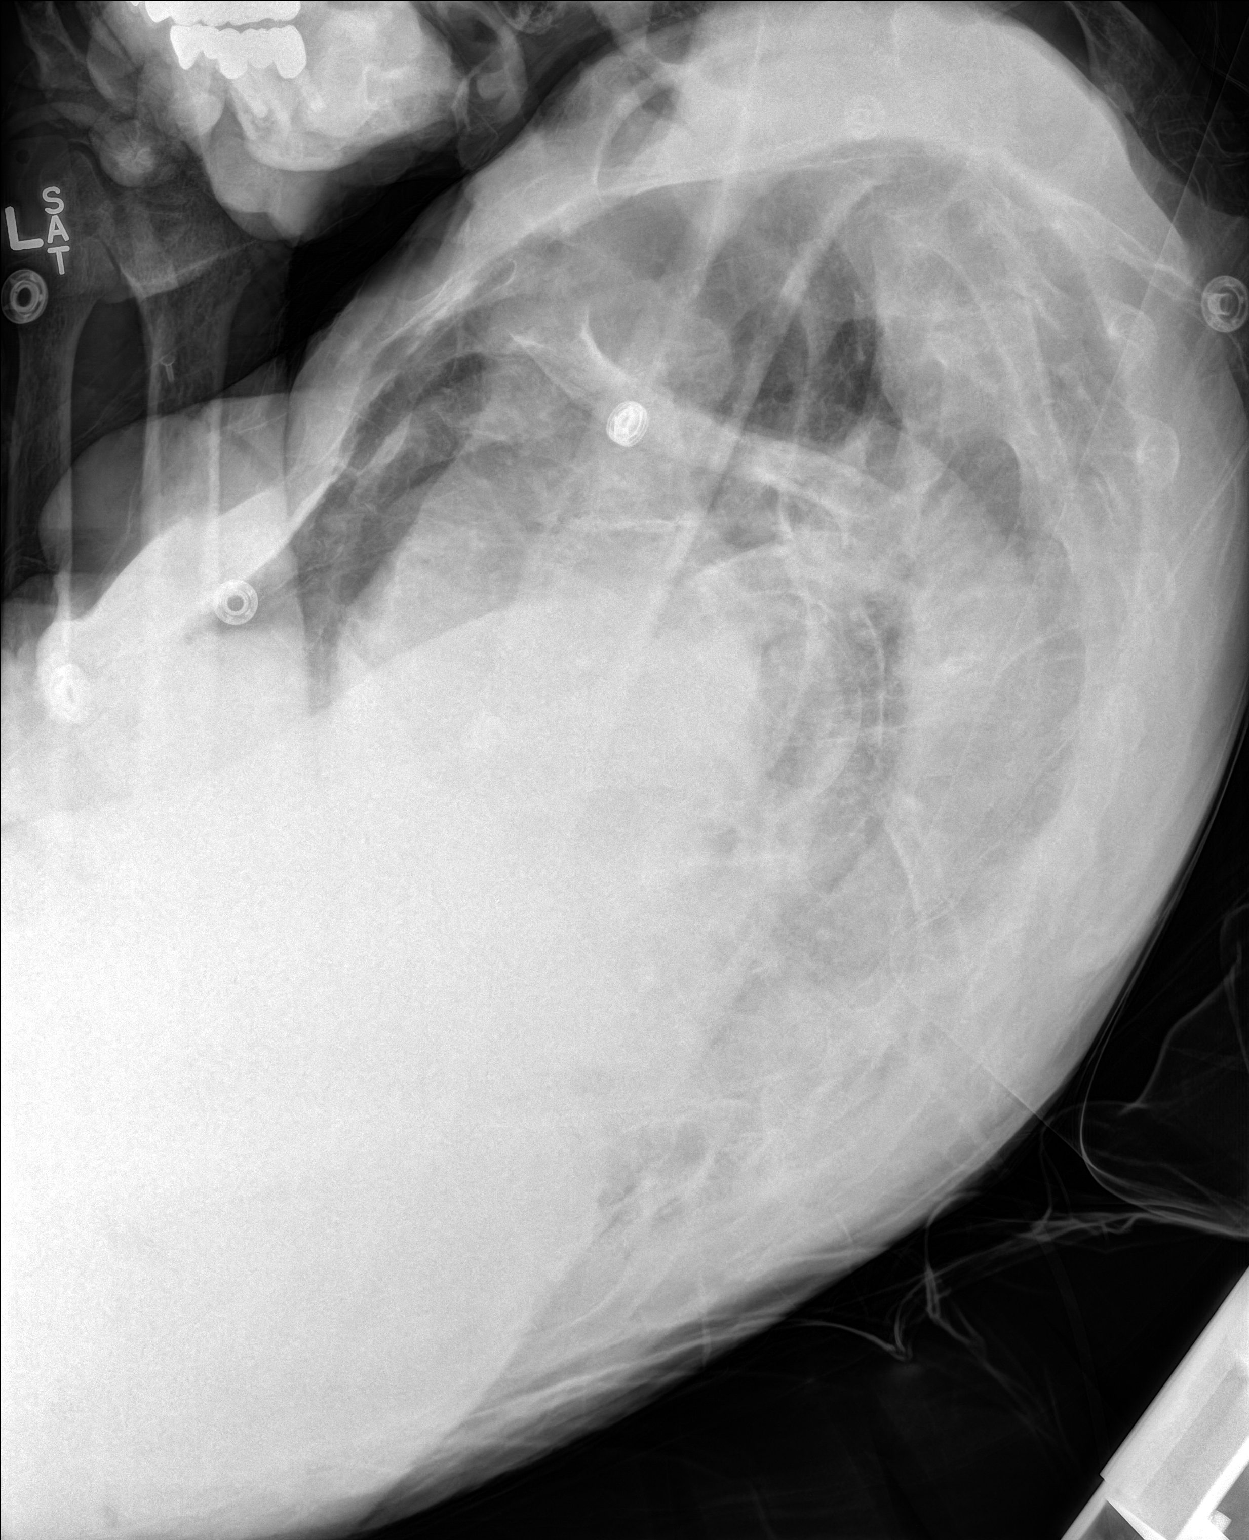

[chest ap]
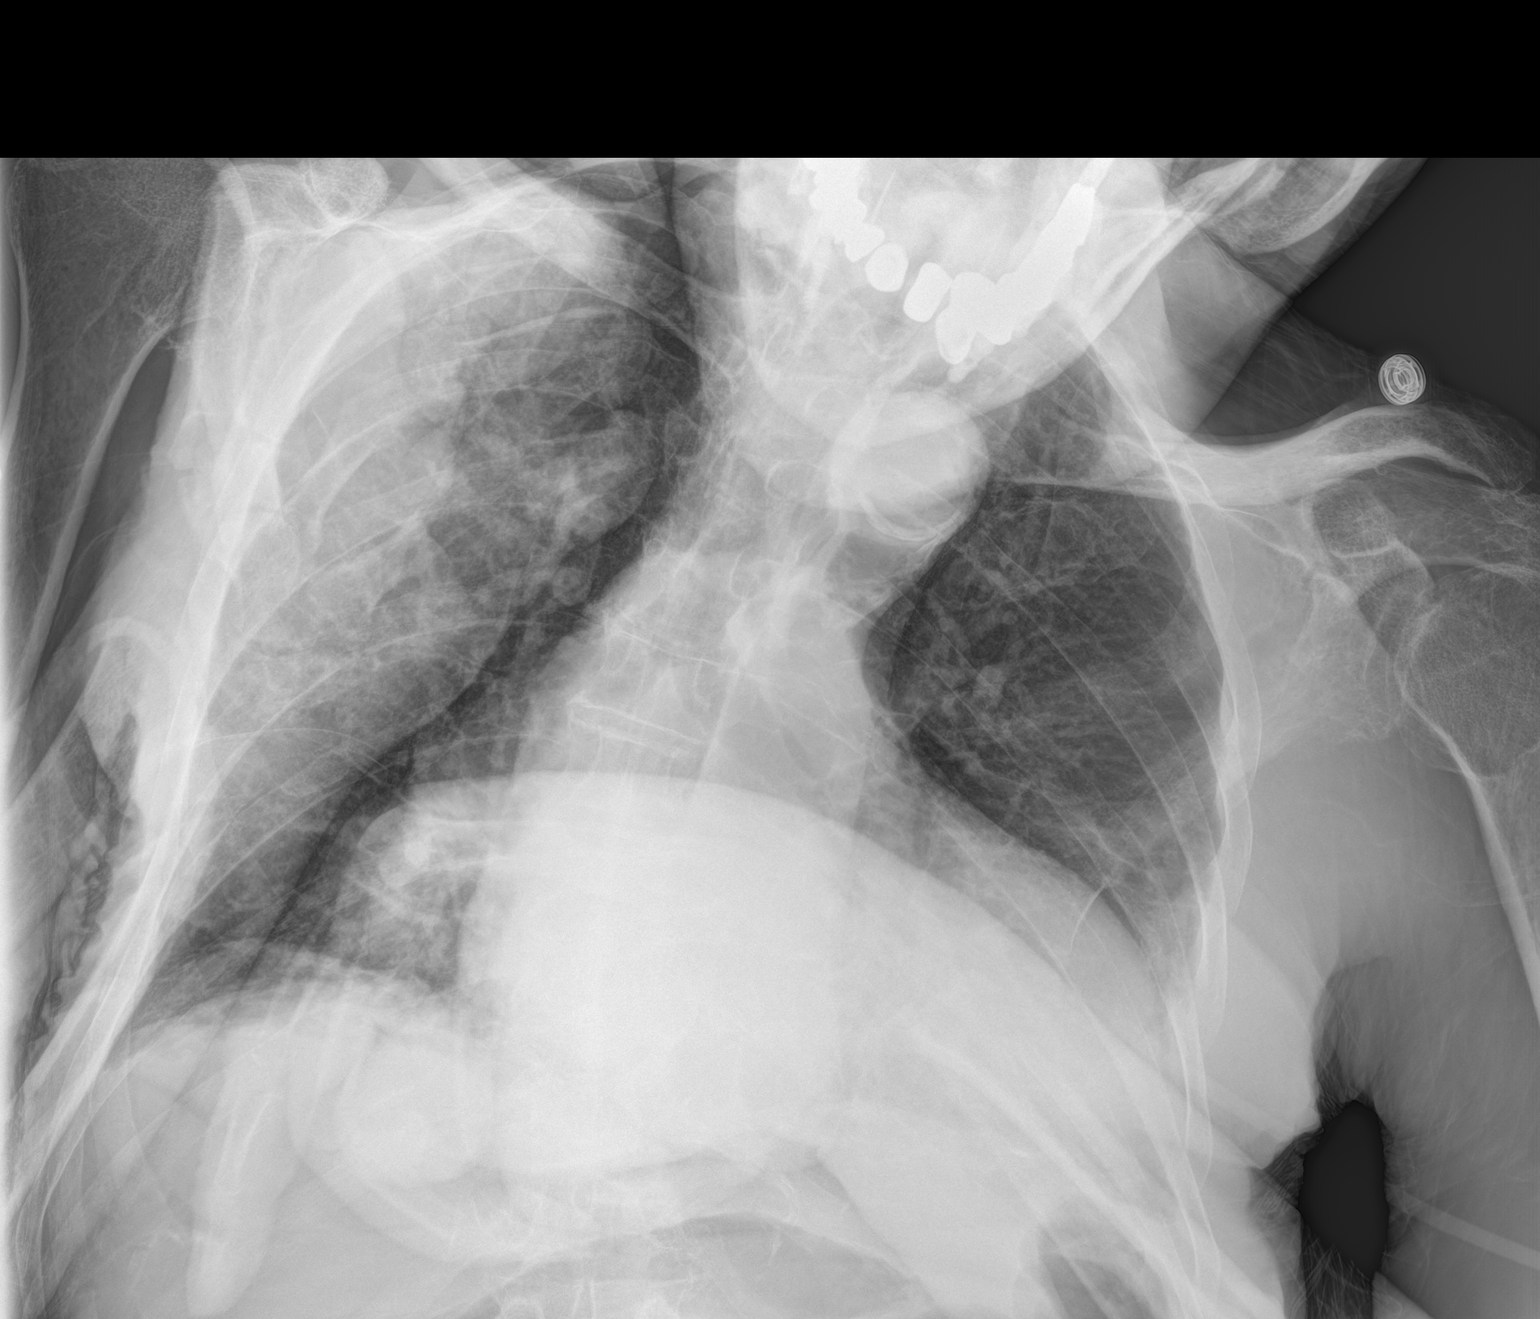

[2 of 2 positions shown; findings below may reference images not displayed]

FINDINGS: Dissemination is quite limited due to the position of the patient
has arms which are obscuring most of the chest. The heart is
enlarged. There is tortuosity and calcification of the thoracic
aorta. The lungs are grossly clear.
IMPRESSION: Very limited examination.  No obvious/gross acute pulmonary process
